# Patient Record
Sex: Male | Born: 1945 | Race: White | Hispanic: No | Marital: Married | Smoking: Former smoker
Health system: Southern US, Community
[De-identification: ages and names within clinical notes are randomized; demographics above are authoritative.]

## PROBLEM LIST (undated history)

## (undated) DIAGNOSIS — IMO0002 Reserved for concepts with insufficient information to code with codable children: Secondary | ICD-10-CM

## (undated) DIAGNOSIS — N400 Enlarged prostate without lower urinary tract symptoms: Secondary | ICD-10-CM

## (undated) DIAGNOSIS — F32A Depression, unspecified: Secondary | ICD-10-CM

## (undated) DIAGNOSIS — M545 Low back pain, unspecified: Secondary | ICD-10-CM

## (undated) DIAGNOSIS — E785 Hyperlipidemia, unspecified: Secondary | ICD-10-CM

## (undated) DIAGNOSIS — F419 Anxiety disorder, unspecified: Secondary | ICD-10-CM

## (undated) DIAGNOSIS — G44009 Cluster headache syndrome, unspecified, not intractable: Secondary | ICD-10-CM

## (undated) DIAGNOSIS — C3490 Malignant neoplasm of unspecified part of unspecified bronchus or lung: Secondary | ICD-10-CM

## (undated) DIAGNOSIS — Z9289 Personal history of other medical treatment: Secondary | ICD-10-CM

## (undated) DIAGNOSIS — J449 Chronic obstructive pulmonary disease, unspecified: Secondary | ICD-10-CM

## (undated) DIAGNOSIS — G709 Myoneural disorder, unspecified: Secondary | ICD-10-CM

## (undated) DIAGNOSIS — M2550 Pain in unspecified joint: Secondary | ICD-10-CM

## (undated) DIAGNOSIS — I2510 Atherosclerotic heart disease of native coronary artery without angina pectoris: Secondary | ICD-10-CM

## (undated) DIAGNOSIS — I10 Essential (primary) hypertension: Secondary | ICD-10-CM

## (undated) DIAGNOSIS — K5900 Constipation, unspecified: Secondary | ICD-10-CM

## (undated) DIAGNOSIS — F329 Major depressive disorder, single episode, unspecified: Secondary | ICD-10-CM

## (undated) DIAGNOSIS — E739 Lactose intolerance, unspecified: Secondary | ICD-10-CM

## (undated) DIAGNOSIS — K219 Gastro-esophageal reflux disease without esophagitis: Secondary | ICD-10-CM

## (undated) DIAGNOSIS — Z7689 Persons encountering health services in other specified circumstances: Secondary | ICD-10-CM

## (undated) DIAGNOSIS — J189 Pneumonia, unspecified organism: Secondary | ICD-10-CM

## (undated) DIAGNOSIS — G4733 Obstructive sleep apnea (adult) (pediatric): Secondary | ICD-10-CM

## (undated) DIAGNOSIS — E119 Type 2 diabetes mellitus without complications: Secondary | ICD-10-CM

## (undated) DIAGNOSIS — E669 Obesity, unspecified: Secondary | ICD-10-CM

## (undated) DIAGNOSIS — N2889 Other specified disorders of kidney and ureter: Secondary | ICD-10-CM

## (undated) DIAGNOSIS — I209 Angina pectoris, unspecified: Secondary | ICD-10-CM

## (undated) DIAGNOSIS — J45909 Unspecified asthma, uncomplicated: Secondary | ICD-10-CM

## (undated) DIAGNOSIS — E559 Vitamin D deficiency, unspecified: Secondary | ICD-10-CM

## (undated) DIAGNOSIS — R011 Cardiac murmur, unspecified: Secondary | ICD-10-CM

## (undated) DIAGNOSIS — C4490 Unspecified malignant neoplasm of skin, unspecified: Secondary | ICD-10-CM

## (undated) DIAGNOSIS — C649 Malignant neoplasm of unspecified kidney, except renal pelvis: Secondary | ICD-10-CM

## (undated) DIAGNOSIS — R079 Chest pain, unspecified: Secondary | ICD-10-CM

## (undated) DIAGNOSIS — K279 Peptic ulcer, site unspecified, unspecified as acute or chronic, without hemorrhage or perforation: Secondary | ICD-10-CM

## (undated) DIAGNOSIS — M1990 Unspecified osteoarthritis, unspecified site: Secondary | ICD-10-CM

## (undated) DIAGNOSIS — H9313 Tinnitus, bilateral: Secondary | ICD-10-CM

## (undated) HISTORY — DX: Benign prostatic hyperplasia without lower urinary tract symptoms: N40.0

## (undated) HISTORY — DX: Pain in unspecified joint: M25.50

## (undated) HISTORY — PX: UPPER GI ENDOSCOPY: SHX6162

## (undated) HISTORY — DX: Peptic ulcer, site unspecified, unspecified as acute or chronic, without hemorrhage or perforation: K27.9

## (undated) HISTORY — DX: Obstructive sleep apnea (adult) (pediatric): G47.33

## (undated) HISTORY — PX: UMBILICAL HERNIA REPAIR: SHX196

## (undated) HISTORY — DX: Constipation, unspecified: K59.00

## (undated) HISTORY — DX: Low back pain, unspecified: M54.50

## (undated) HISTORY — DX: Type 2 diabetes mellitus without complications: E11.9

## (undated) HISTORY — PX: BACK SURGERY: SHX140

## (undated) HISTORY — PX: COLONOSCOPY: SHX174

## (undated) HISTORY — DX: Lactose intolerance, unspecified: E73.9

## (undated) HISTORY — PX: LAPAROSCOPIC CHOLECYSTECTOMY: SUR755

## (undated) HISTORY — PX: NASAL SINUS SURGERY: SHX719

## (undated) HISTORY — DX: Essential (primary) hypertension: I10

## (undated) HISTORY — DX: Gastro-esophageal reflux disease without esophagitis: K21.9

## (undated) HISTORY — DX: Chronic obstructive pulmonary disease, unspecified: J44.9

## (undated) HISTORY — DX: Chest pain, unspecified: R07.9

## (undated) HISTORY — DX: Unspecified osteoarthritis, unspecified site: M19.90

## (undated) HISTORY — DX: Atherosclerotic heart disease of native coronary artery without angina pectoris: I25.10

## (undated) HISTORY — DX: Hyperlipidemia, unspecified: E78.5

## (undated) HISTORY — PX: HIATAL HERNIA REPAIR: SHX195

## (undated) HISTORY — PX: CERVICAL SPINE SURGERY: SHX589

## (undated) HISTORY — PX: OTHER SURGICAL HISTORY: SHX169

## (undated) HISTORY — DX: Low back pain: M54.5

## (undated) HISTORY — PX: ESOPHAGEAL MANOMETRY: SHX1526

## (undated) HISTORY — DX: Obesity, unspecified: E66.9

## (undated) HISTORY — PX: CARDIAC CATHETERIZATION: SHX172

## (undated) HISTORY — DX: Reserved for concepts with insufficient information to code with codable children: IMO0002

## (undated) HISTORY — DX: Cluster headache syndrome, unspecified, not intractable: G44.009

---

## 1998-01-19 ENCOUNTER — Ambulatory Visit (HOSPITAL_COMMUNITY): Admission: RE | Admit: 1998-01-19 | Discharge: 1998-01-19 | Payer: Self-pay | Admitting: Cardiology

## 1998-05-15 ENCOUNTER — Encounter: Admission: RE | Admit: 1998-05-15 | Discharge: 1998-06-03 | Payer: Self-pay | Admitting: Anesthesiology

## 1998-09-16 ENCOUNTER — Encounter: Admission: RE | Admit: 1998-09-16 | Discharge: 1998-12-10 | Payer: Self-pay | Admitting: Anesthesiology

## 1999-08-24 ENCOUNTER — Inpatient Hospital Stay (HOSPITAL_COMMUNITY): Admission: EM | Admit: 1999-08-24 | Discharge: 1999-08-27 | Payer: Self-pay | Admitting: Emergency Medicine

## 1999-08-26 ENCOUNTER — Encounter: Payer: Self-pay | Admitting: Gastroenterology

## 1999-11-08 ENCOUNTER — Inpatient Hospital Stay (HOSPITAL_COMMUNITY): Admission: EM | Admit: 1999-11-08 | Discharge: 1999-11-10 | Payer: Self-pay | Admitting: Emergency Medicine

## 2000-02-25 ENCOUNTER — Encounter: Payer: Self-pay | Admitting: Anesthesiology

## 2000-02-25 ENCOUNTER — Ambulatory Visit (HOSPITAL_COMMUNITY): Admission: RE | Admit: 2000-02-25 | Discharge: 2000-02-25 | Payer: Self-pay | Admitting: Anesthesiology

## 2000-02-29 ENCOUNTER — Encounter: Admission: RE | Admit: 2000-02-29 | Discharge: 2000-05-29 | Payer: Self-pay | Admitting: Anesthesiology

## 2000-03-13 ENCOUNTER — Encounter: Payer: Self-pay | Admitting: Emergency Medicine

## 2000-03-13 ENCOUNTER — Inpatient Hospital Stay (HOSPITAL_COMMUNITY): Admission: EM | Admit: 2000-03-13 | Discharge: 2000-03-16 | Payer: Self-pay | Admitting: Emergency Medicine

## 2000-03-14 ENCOUNTER — Encounter: Payer: Self-pay | Admitting: Surgery

## 2000-05-16 ENCOUNTER — Ambulatory Visit (HOSPITAL_COMMUNITY): Admission: RE | Admit: 2000-05-16 | Discharge: 2000-05-16 | Payer: Self-pay | Admitting: Neurosurgery

## 2000-05-16 ENCOUNTER — Encounter: Payer: Self-pay | Admitting: Neurosurgery

## 2000-05-31 ENCOUNTER — Encounter: Payer: Self-pay | Admitting: Neurosurgery

## 2000-05-31 ENCOUNTER — Ambulatory Visit (HOSPITAL_COMMUNITY): Admission: RE | Admit: 2000-05-31 | Discharge: 2000-06-01 | Payer: Self-pay | Admitting: Neurosurgery

## 2000-06-27 ENCOUNTER — Encounter: Payer: Self-pay | Admitting: Neurosurgery

## 2000-06-27 ENCOUNTER — Ambulatory Visit (HOSPITAL_COMMUNITY): Admission: RE | Admit: 2000-06-27 | Discharge: 2000-06-27 | Payer: Self-pay | Admitting: Neurosurgery

## 2000-09-04 ENCOUNTER — Encounter: Payer: Self-pay | Admitting: Neurosurgery

## 2000-09-04 ENCOUNTER — Ambulatory Visit (HOSPITAL_COMMUNITY): Admission: RE | Admit: 2000-09-04 | Discharge: 2000-09-04 | Payer: Self-pay | Admitting: Neurosurgery

## 2000-09-08 ENCOUNTER — Inpatient Hospital Stay (HOSPITAL_COMMUNITY): Admission: EM | Admit: 2000-09-08 | Discharge: 2000-09-12 | Payer: Self-pay | Admitting: Emergency Medicine

## 2000-09-11 ENCOUNTER — Encounter: Payer: Self-pay | Admitting: *Deleted

## 2001-01-09 ENCOUNTER — Encounter: Payer: Self-pay | Admitting: Neurosurgery

## 2001-01-09 ENCOUNTER — Ambulatory Visit (HOSPITAL_COMMUNITY): Admission: RE | Admit: 2001-01-09 | Discharge: 2001-01-09 | Payer: Self-pay | Admitting: Neurosurgery

## 2002-10-24 ENCOUNTER — Ambulatory Visit (HOSPITAL_COMMUNITY): Admission: RE | Admit: 2002-10-24 | Discharge: 2002-10-24 | Payer: Self-pay | Admitting: Ophthalmology

## 2004-03-22 ENCOUNTER — Ambulatory Visit (HOSPITAL_COMMUNITY): Admission: RE | Admit: 2004-03-22 | Discharge: 2004-03-22 | Payer: Self-pay | Admitting: General Surgery

## 2004-03-22 ENCOUNTER — Encounter (INDEPENDENT_AMBULATORY_CARE_PROVIDER_SITE_OTHER): Payer: Self-pay | Admitting: Specialist

## 2004-03-22 ENCOUNTER — Ambulatory Visit (HOSPITAL_BASED_OUTPATIENT_CLINIC_OR_DEPARTMENT_OTHER): Admission: RE | Admit: 2004-03-22 | Discharge: 2004-03-22 | Payer: Self-pay | Admitting: General Surgery

## 2006-06-26 ENCOUNTER — Ambulatory Visit (HOSPITAL_COMMUNITY): Admission: RE | Admit: 2006-06-26 | Discharge: 2006-06-27 | Payer: Self-pay | Admitting: Cardiology

## 2007-04-26 ENCOUNTER — Ambulatory Visit (HOSPITAL_COMMUNITY): Admission: RE | Admit: 2007-04-26 | Discharge: 2007-04-26 | Payer: Self-pay | Admitting: Cardiology

## 2007-05-24 ENCOUNTER — Encounter: Admission: RE | Admit: 2007-05-24 | Discharge: 2007-05-24 | Payer: Self-pay | Admitting: Gastroenterology

## 2007-05-30 ENCOUNTER — Encounter: Admission: RE | Admit: 2007-05-30 | Discharge: 2007-05-30 | Payer: Self-pay | Admitting: *Deleted

## 2007-06-03 ENCOUNTER — Inpatient Hospital Stay (HOSPITAL_COMMUNITY): Admission: EM | Admit: 2007-06-03 | Discharge: 2007-06-13 | Payer: Self-pay | Admitting: Emergency Medicine

## 2007-06-13 ENCOUNTER — Ambulatory Visit: Payer: Self-pay | Admitting: Physical Medicine & Rehabilitation

## 2007-06-13 ENCOUNTER — Inpatient Hospital Stay (HOSPITAL_COMMUNITY)
Admission: RE | Admit: 2007-06-13 | Discharge: 2007-06-22 | Payer: Self-pay | Admitting: Physical Medicine & Rehabilitation

## 2007-06-14 HISTORY — PX: OTHER SURGICAL HISTORY: SHX169

## 2007-07-12 ENCOUNTER — Ambulatory Visit (HOSPITAL_COMMUNITY): Admission: RE | Admit: 2007-07-12 | Discharge: 2007-07-12 | Payer: Self-pay | Admitting: Surgery

## 2007-07-16 ENCOUNTER — Ambulatory Visit (HOSPITAL_COMMUNITY): Admission: RE | Admit: 2007-07-16 | Discharge: 2007-07-16 | Payer: Self-pay | Admitting: Surgery

## 2007-07-19 ENCOUNTER — Ambulatory Visit (HOSPITAL_COMMUNITY): Admission: RE | Admit: 2007-07-19 | Discharge: 2007-07-19 | Payer: Self-pay | Admitting: Gastroenterology

## 2007-09-03 ENCOUNTER — Encounter (INDEPENDENT_AMBULATORY_CARE_PROVIDER_SITE_OTHER): Payer: Self-pay | Admitting: Surgery

## 2007-09-03 ENCOUNTER — Inpatient Hospital Stay (HOSPITAL_COMMUNITY): Admission: RE | Admit: 2007-09-03 | Discharge: 2007-09-05 | Payer: Self-pay | Admitting: Surgery

## 2007-10-10 ENCOUNTER — Ambulatory Visit (HOSPITAL_COMMUNITY): Admission: RE | Admit: 2007-10-10 | Discharge: 2007-10-10 | Payer: Self-pay | Admitting: Internal Medicine

## 2008-07-22 ENCOUNTER — Ambulatory Visit: Payer: Self-pay | Admitting: Cardiology

## 2008-12-25 ENCOUNTER — Encounter: Payer: Self-pay | Admitting: Cardiology

## 2008-12-26 ENCOUNTER — Encounter: Payer: Self-pay | Admitting: Cardiology

## 2009-01-13 DIAGNOSIS — K219 Gastro-esophageal reflux disease without esophagitis: Secondary | ICD-10-CM

## 2009-01-13 DIAGNOSIS — M545 Low back pain, unspecified: Secondary | ICD-10-CM | POA: Insufficient documentation

## 2009-01-13 DIAGNOSIS — N320 Bladder-neck obstruction: Secondary | ICD-10-CM

## 2009-01-13 DIAGNOSIS — I10 Essential (primary) hypertension: Secondary | ICD-10-CM | POA: Insufficient documentation

## 2009-01-13 DIAGNOSIS — E785 Hyperlipidemia, unspecified: Secondary | ICD-10-CM

## 2009-01-13 DIAGNOSIS — K279 Peptic ulcer, site unspecified, unspecified as acute or chronic, without hemorrhage or perforation: Secondary | ICD-10-CM

## 2009-01-13 DIAGNOSIS — J449 Chronic obstructive pulmonary disease, unspecified: Secondary | ICD-10-CM

## 2009-01-13 DIAGNOSIS — G44009 Cluster headache syndrome, unspecified, not intractable: Secondary | ICD-10-CM

## 2009-01-13 DIAGNOSIS — I2510 Atherosclerotic heart disease of native coronary artery without angina pectoris: Secondary | ICD-10-CM

## 2009-01-21 ENCOUNTER — Ambulatory Visit: Payer: Self-pay | Admitting: Cardiology

## 2009-06-18 ENCOUNTER — Encounter (INDEPENDENT_AMBULATORY_CARE_PROVIDER_SITE_OTHER): Payer: Self-pay | Admitting: *Deleted

## 2009-08-03 ENCOUNTER — Encounter: Admission: RE | Admit: 2009-08-03 | Discharge: 2009-08-03 | Payer: Self-pay | Admitting: Otolaryngology

## 2009-12-29 ENCOUNTER — Encounter: Payer: Self-pay | Admitting: Cardiology

## 2010-07-15 NOTE — Letter (Signed)
Summary: Appointment - Reminder 2  [employer], Main Office  [address]   [place], [place] [address]   Phone: [phone]  Fax: [phone]     June 18, 2009 MRN: [id number]   [facility] [address] [place], [place]  [address]   Dear [name],  Our records indicate that it is time to schedule a follow-up appointment with Dr. Shirlee Latch. It is very important that we reach you to schedule this appointment. We look forward to participating in your health care needs. Please contact us at the number listed above at your earliest convenience to schedule your appointment.  If you are unable to make an appointment at this time, give [name] a call so we can update our records.  Sincerely,    Migdalia Dk [facility] Scheduling Team

## 2010-09-22 ENCOUNTER — Other Ambulatory Visit (INDEPENDENT_AMBULATORY_CARE_PROVIDER_SITE_OTHER): Payer: Self-pay | Admitting: Otolaryngology

## 2010-09-22 DIAGNOSIS — J320 Chronic maxillary sinusitis: Secondary | ICD-10-CM

## 2010-09-27 ENCOUNTER — Ambulatory Visit
Admission: RE | Admit: 2010-09-27 | Discharge: 2010-09-27 | Disposition: A | Discharge status: Home / Self Care | Payer: Medicare Other | Source: Ambulatory Visit | Attending: Otolaryngology | Admitting: Otolaryngology

## 2010-09-27 DIAGNOSIS — J320 Chronic maxillary sinusitis: Secondary | ICD-10-CM

## 2010-10-26 NOTE — Discharge Summary (Signed)
[name], [name] NO.:  [id number]   MEDICAL RECORD NO.:  [id number]          PATIENT TYPE:  INP   LOCATION:  5022                         FACILITY:  [facility]   PHYSICIAN:  Gabrielle Dare. Janee Morn, M.D.DATE OF BIRTH:  04/20/1946   DATE OF ADMISSION:  06/03/2007  DATE OF DISCHARGE:  06/13/2007                               DISCHARGE SUMMARY   The patient discharged to rehab.   ADMITTING TRAUMA SURGEON:  Adolph Pollack, M.D.   CONSULTANTS:  Payton Doughty, M.D., neurosurgery, Newman Pies, M.D., ENT, and  [name], M.D., hand surgery.   DISCHARGE DIAGNOSES:  1. Fall from a roof.  2. Basilar skull fracture/concussion.  3. Thoracic one through thoracic four spine fractures, unstable.  4. Right intra-articular distal radius fracture.  5. Cervical five-six spinous process fractures.  6. Bilateral first rib fractures.  7. Bilateral nasal bone fractures.  8. Bilateral maxillary sinus fracture.  9. History of coronary artery disease.  10.Dyslipidemia.  11.Diabetes mellitus.  12.Hypertension.   PROCEDURE:  A C5 through T6 posterior fusion per Dr. Channing Mutters on June 05, 2007, and open reduction internal fixation, right intra-articular distal  radius fracture and right carpal tunnel release on June 08, 2007,  Dr. Mina Marble.   HISTORY OF ADMISSION:  [name] is a 65 year old male with multiple  premorbid medical problems who fell off a roof.  He apparently called  for help utilizing a cell phone.  There was suspected loss of  consciousness.  He presented complaining of upper back pain.  His  initial blood pressure was 136/57, pulse was 81.  Oxygen was 97% on  oxygen by face mask.   Workup at this time, including a plain chest x-ray, was negative.  Plain  pelvic films were negative.  Right wrist film showed a distal intra-  articular comminuted radial fracture.  Head CT scan showed no acute  intracranial bleeding, but there was evidence for basilar skull  fracture.  C-spine CT scan showed a T1 spinous process fracture and C5  and C6 spinous process fractures.  Chest CT scan showed T2, T3, and T4  fractures with some retropulsion about T3 and associated hematoma.  Abdominal and pelvic CT scan showed hiatal hernia, but there was no  evidence for acute solid organ injury or free fluid or other fractures  in the L-spine or in the pelvis.   The patient was admitted to the ICU.  He was seen in consultation per  Dr. Channing Mutters, and it was felt that his spine fractures were unstable and  ultimately would required fixation.  The patient was seen by cardiology,  and it was recommended that he continue aspirin for prophylaxis but that  his Plavix could be discontinued.  He subsequently was able to undergo  posterior fusion from C5-T6 per Dr. Channing Mutters on June 05, 2007.  He  remained intubated initially postop but was able to be extubated the  following day, on June 06, 2007.  He did have some difficulty with  mobilization following this but ultimately was mobilized with therapies,  albeit with some continued deficits.  Rehabilitation assessed the  patient and felt that he would benefit from a comprehensive inpatient  rehabilitation stay, and the patient was transferred to rehab on  June 13, 2007, for this purpose.   Right wrist fracture.  The patient did have a comminuted intra-articular  right distal radius fracture.  He underwent delayed ORIF once he was  stabilized sufficiently per Dr. Mina Marble on June 08, 2007.  He had  no further difficulties following this.   Multiple facial fractures.  The patient was seen in consultation per Dr.  Suszanne Conners for his fractures.  They were minimally displaced, and it was felt  that no surgical intervention would be required.   Again, the patient was making gradual gains and it was felt he should be  transferred to rehab, and he was accepted to rehab for continued  mobilization on June 13, 2007.       Shawn Rayburn, P.A.      Gabrielle Dare Janee Morn, M.D.  Electronically Signed    SR/MEDQ  D:  06/29/2007  T:  06/29/2007  Job:  [id number]

## 2010-10-26 NOTE — Op Note (Signed)
[name], ENGEN NO.:  [id number]   MEDICAL RECORD NO.:  [id number]          PATIENT TYPE:  INP   LOCATION:  3104                         FACILITY:  [facility]   PHYSICIAN:  Payton Doughty, M.D.      DATE OF BIRTH:  1946/03/17   DATE OF PROCEDURE:  06/05/2007  DATE OF DISCHARGE:                               OPERATIVE REPORT   PREOPERATIVE DIAGNOSIS:  T1 laminar fracture, T2, T3, T4 vertebral body  fractures.   POSTOPERATIVE DIAGNOSIS:  T1 laminar fracture, T2, T3, T4 vertebral body  fractures.   OPERATIVE PROCEDURE:  C5-T6 posterior fusion with Oasis and radius  screws.   SURGEON:  Payton Doughty, M.D.   ANESTHESIA:  General endotracheal.  Prep __________ alcohol wipe.   COMPLICATIONS:  None.   NURSE ASSISTANT:  [place].   DOCTOR ASSISTANT:  Phoebe Perch.   A 65 year old gentleman fell off a roof the day before yesterday and has  incurred compression fractures of T2, T3, and T4 as well as a laminar  fracture of T1.   The patient was taken to operating room, __________  intubated, placed  prone on the operating table on the Stryker bed.  All pressure points  were padded.  Following shave, prep, and drape in the usual sterile  fashion, the skin was infiltrated with 1% lidocaine with 1:100,000  epinephrine.  The skin was incised from the top of C5 to the bottom of  T6.  There was a lot of soft tissue injury which bled in an oozing  fashion throughout most of the case.  The lamina of C5, C6, C7 were  identified easily.  The lamina of T1 had been fractured off.  However,  from the films it was noted that the transverse processes and the  vertebral body were intact.  These were dissected free.  The lamina of  T2 was fractured.  The transverse process of T2 were intact, however,  there was a vertebral body fracture.  The transverse process of T3 on  the right was fractured.  The transverse process of T4 on the right was  also fractured, and on the left was intact.   T5 and T6 were intact.  Lateral mass screws were placed in C5, C6, and C7.  Pedicle screws were  placed in T1 and T2 on the left.  T1, T2, and T3 on the right.  Those  were Oasis screws.  Radius screws which were 4.75 x 40 mm were placed  and T6 and T5 on the right and T6, T5, and T4 on the left.  This left a  gap at T3 on the left and at T4 on the right. The radius rod was fitted  to the radius screws and the Oasis rod was fitted to the Oasis screws.  The rods were then contoured at their terminal end to fit in the  connecting device and allow coupling of the radius to the Oasis rods.  This allowed good realignment of the spine.  All locking caps and screws  were tightened.  The set screws in the connecting device was tightened.  The  wound was irrigated.  Hemostasis assured.  The transverse processes  and lamina were decorticated with a high speed drill and packed with BMP  on the extender  matrix Actifuse.  Final x-ray showed good placement of all hardware.  Successive layers of 0 Vicryl, 2-0 Vicryl, and 3-0 nylon were used to  close.  Betadine and Telfa dressings applied.  The patient's splint was  placed back on his right arm and he is being allowed to awaken from  anesthesia at this time.           ______________________________  Payton Doughty, M.D.     MWR/MEDQ  D:  06/05/2007  T:  06/06/2007  Job:  [id number]

## 2010-10-26 NOTE — Consult Note (Signed)
NAMEEZEKIAL, ARNS NO.:  [id number]   MEDICAL RECORD NO.:  [id number]          PATIENT TYPE:  IPS   LOCATION:  4011                         FACILITY:  [facility]   PHYSICIAN:  Fayrene Fearing L. Malon Kindle., M.D.DATE OF BIRTH:  [birth year]   DATE OF CONSULTATION:  06/19/2007  DATE OF DISCHARGE:                                 CONSULTATION   REASON FOR CONSULTATION:  Dyspepsia.   HISTORY:  A 65 year old gentleman who has had a long history of  esophageal reflux.  I have been seeing him for a number of years, and he  had a Nissen fundoplication done approximately 30 years ago due to  severe reflux.  This was done as an open procedure.  He has had  continuing symptoms since that of reflux and vague epigastric pain.  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  Initially this has been  somewhat asymptomatic, but it has become progressively more symptomatic.  He has actually come into the hospital with chest pain and was evaluated  for possible coronary disease.  It was felt this was noncardiac pain and  he was sent back over.  We just re-endoscoped him within the last month,  and it was felt compared to previous endoscopies that his paraesophageal  hernia was increasing in size.  I made the decision to send him to Dr.  Karie Soda for evaluation, with a probable laparoscopic revision of  his Nissen.  It appeared that his Nissen fundoplication was coming down,  and we therefore felt this should be reevaluated.   Notably, he has been on number of PPIs in the past without problems.  Both Prilosec, Nexium Protonix, etc.  He has taking all these on a  p.r.n. basis.   He was admitted to the hospital on June 03, 2007 after falling off a  roof.  He was blowing leaves and slipped off.  He has had extensive  hospitalization on the trauma service and now is on the rehabilitation  service.  His injuries  have included fractures of his skull and the  basal skull fracture; sinus and nasal bones, a T1-T4 burst fractures,  and fractures of the spinous processes of C5 and C6.   DICTATION CUT OFF HERE           ______________________________  Llana Aliment. Malon Kindle., M.D.     Waldron Session  D:  06/19/2007  T:  06/19/2007  Job:  [id number]   cc:   Lucky Cowboy, M.D.  Ardeth Sportsman, MD

## 2010-10-26 NOTE — Consult Note (Signed)
NAMEELIU, BATCH NO.:  [id number]   MEDICAL RECORD NO.:  [id number]          PATIENT TYPE:  IPS   LOCATION:  4011                         FACILITY:  [facility]   PHYSICIAN:  Fayrene Fearing L. Malon Kindle., M.D.DATE OF BIRTH:  1945-11-24   DATE OF CONSULTATION:  07/05/2007  DATE OF DISCHARGE:  06/22/2007                                 CONSULTATION   ADDENDUM:  Please see previously dictated partial consult.  The hospital  had a power outage in the middle of the dictation and a good portion of  it was cut off, and I am picking up where it appeared to be cut off.   The patient had dyspepsia and epigastric discomfort following his  admission here for a fall from the roof.  He had not been started back  on Protonix.  Initially he was, but apparently when he got to the rehab  unit, this got stopped for unclear reasons.  He was changed to Carafate.  He noted he had tolerated proton pump inhibitors in the past with very  little problem.   CURRENT MEDICATIONS:  Please see hospital chart.   PAST MEDICAL HISTORY:  Reviewed and no significant changes.   PHYSICAL EXAMINATION:  VITAL SIGNS:  Unremarkable.  GENERAL:  The patient sitting in a wheelchair with a neck brace on.  HEART/LUNGS:  Normal.  ABDOMEN:  Soft and nontender.   ASSESSMENT:  Dyspepsia almost certainly due to the fact that he is not  on proton pump inhibitors, he has chronically required these.  He is due  at some point in time to have his paraesophageal hiatal hernia repaired  by Dr. Michaell Cowing.  This had been present for several years.  There has been  no enlarging and it is getting to the point now he is beginning to  have difficulty.   PLAN:  1. We will go ahead and start him back on his proton pump inhibitor      b.i.d. for 3 days and then daily.  2. Suggested that he see Dr. Michaell Cowing back in the office in the next 2-3      months after he has gotten over his fall to see about moving ahead      with repair of his  paraesophageal hiatal hernia.           ______________________________  Llana Aliment. Malon Kindle., M.D.    Waldron Session  D:  07/05/2007  T:  07/06/2007  Job:  [id number]   cc:   Ellwood Dense, M.D.  Lucky Cowboy, M.D.  Ardeth Sportsman, MD  Llana Aliment Malon Kindle., M.D.

## 2010-10-26 NOTE — Discharge Summary (Signed)
[name], [name] NO.:  [id number]   MEDICAL RECORD NO.:  [id number]          PATIENT TYPE:  IPS   LOCATION:  4011                         FACILITY:  [facility]   PHYSICIAN:  [name], M.D.   DATE OF BIRTH:  [birth year]   DATE OF ADMISSION:  06/13/2007  DATE OF DISCHARGE:  06/22/2007                               DISCHARGE SUMMARY   REVIEW OF SYSTEMS:   DISCHARGE DIAGNOSES:  1. Multiple trauma after fall, June 03, 2007.  2. Basilar skull fracture, as well as facial fractures.  3. Right distal radius fracture with open external fixation, as well      as right carpal tunnel release, June 08, 2007.  4. Cervical C5-6 and thoracic T1 spinous process fracture.  5. Thoracic T3-4 burst fracture.  6. Status post C5-T6 posterior fusion.  7. Dysphagia.  8. Diabetes mellitus.  9. Coronary artery disease with percutaneous transluminal coronary      angioplasty.  10.Upper gastrointestinal bleed.   This is a 65 year old male admitted December 21, after a fall off a  roof, sustained right distal radius fractures.  Cervical C5-6 spinous  process fracture, thoracic T1 spinous process fracture with burst  fracture of thoracic T3 and T4 with mild retropulsion at T3, as well as  findings of basilar skull fracture.  Cardiac clearance for history of  coronary artery disease and underwent cervical C5-T6 segmental fusion  December 23 per [name], M.D. and open reduction internal external  fixation, distal radius on right per [name], M.D., as  carpal tunnel release on December 26.  Facial fractures with  conservative care, as well as conservative care of basilar skull  fracture.  Fitted with CTLSO brace when out of bed, as well as [place]-J  collar when sitting.  Non-weightbearing right upper extremity with  platform walker.  Hospital course with  TNA for nutrition.  His diet was  slowly advanced.  Foley catheter tube removed December 30.  Placed on  Avelox December 29 for empiric coverage secondary to some chest rhonchi.  Follow-up cranial CT scan December 27, for basilar skull fracture  unchanged from prior scans.   PAST MEDICAL HISTORY:  1. See discharge diagnoses.  2. Remote smoker.  3. No alcohol.   ALLERGIES:  PENICILLIN, SULFA AND CODEINE.   SOCIAL HISTORY:  Married, semi-retired.  He works in [employer], [employer], wife  works day shifts, one level home, three steps to entry.  No other local  family.   MEDICATIONS PRIOR TO ADMISSION:  Aspirin, Plavix, metoprolol,  nitroglycerin as needed, Welchol.   REHABILITATION HOSPITAL COURSE:  The patient was admitted to inpatient  rehab services with therapies initiated on a 3-hour daily basis  consisting of physical therapy, occupational therapy, speech therapy and  rehabilitation nursing.  The following issues were addressed during the  patient's rehabilitation stay.  Pertaining to [name]'s multiple  trauma, basilar skull fracture, conservative care provided, right distal  radius fracture, non-weightbearing right upper extremity after open  external fixation followed by Darrell Pais. Mina Mccullough, M.D. of orthopedic  services.  He had been fitted with a resting splint  to be on at all  times except when bathing.  He continued with his CTLSO brace for  cervical fractures and had undergone cervical C5-6, thoracic T6  posterior fusion.  He was able to wear a [place]-J collar when sitting.  His diet was steadily advanced which he tolerated well.  Blood sugars  controlled now on diet.  His Lantus insulin had been discontinued.  He  completed a course of Avelox for suspect pneumonia.  His lungs were  clear to auscultation.  Oxygen saturation was greater than 90% on room  air.  Functional mobility.  Currently, the patient was minimal assist  150 feet, close supervision for stairs, supervision self-feeding,  minimal assist grooming, bathing, upper body dressing, toilet transfers,  moderate assist  lower body dressing.  Overall strength endurance had  greatly improved.  He was encouraged of overall progress and ultimate  Mccullough was discharged to home.   Latest labs showed hemoglobin 10.5, hematocrit 31.  Sodium 130,  potassium 3.8, BUN 10, creatinine 0.8.   DISCHARGE MEDICATIONS:  Time of dictation included:  1. Lopressor 75 mg daily.  2. Advair 100/50 Diskus 1 puff daily.  3. Aspirin 81 mg daily.  4. Carafate 1 gram a.c. and h.s.  5. Duragesic patch 50 mcg change every 72 hours.  He is provided with      five patches.  6. Protonix 40 mg daily.  7. Plavix 75 mg daily.  8. Nitroglycerin as needed.  9. Vicodin 5/325 one or two tablets four times daily as needed,      dispense 60 tablets.  10.He was on subcutaneous Lovenox throughout his hospital course for      deep vein thrombosis prophylaxis.  This was discontinued at the      time of discharge.   Diet was diabetic diet.   He was advised to follow up with [name], M.D., [phone],  neurosurgery, Darrell Pais. Mina Mccullough, M.D., orthopedic hand surgeon and  remain follow up with his primary MD,  Dr. Marvel Mccullough.      [name], P.A.    ______________________________  [name], M.D.    DA/MEDQ  D:  06/21/2007  T:  06/21/2007  Job:  [id number]   cc:   [name], M.D.  [name], M.D.  [name], M.D.  [name], Dr.

## 2010-10-26 NOTE — Consult Note (Signed)
NAMEAPOLO, CUTSHAW NO.:  [id number]   MEDICAL RECORD NO.:  [id number]          PATIENT TYPE:  INP   LOCATION:  3310                         FACILITY:  [facility]   PHYSICIAN:  Artist Pais. Weingold, M.D.DATE OF BIRTH:  1946/05/10   DATE OF CONSULTATION:  06/03/2007  DATE OF DISCHARGE:                                 CONSULTATION   REASON FOR CONSULTATION:  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  He is  61.   PAST MEDICAL HISTORY:  Significant for having stent placed x3, history  of an upper GI bleed.   CURRENT MEDICATIONS:  He is currently on Plavix.  He has had other  medications including medications for hypercholesterolemia and  hypertension.   PHYSICAL EXAMINATION:  GENERAL:  In the emergency department reveals  well-developed, well-nourished male alert and oriented x3.  MUSCULOSKELETAL:  He is in a cervical spine brace with regards to his  right upper extremity.  He is in a well-padded volar splint.  He  complains of no numbness and tingling.  His digits appeared to be  minimally swollen.  By history this is a closed injury.  X-rays show  displaced intra-articular fracture of distal radius with 20 degrees of  dorsal angulation and a possible nondisplaced 2 mm displaced triquetral  fracture.   [name] is a 65 year old male status post fall off a roof with  multiple injuries including a spinal injury, facial fractures, displaced  intra-articular fracture, nondominant right distal radius.  I have  discussed with both the neurosurgeons and the trauma service his  fracture is stable in his splint. When he  is stabilized and/or will be  taken to the operating room for his other procedures, I will proceed  with fixation of distal radius at that point in time.  He will be  admitted to the trauma service, and I will follow [name]  with them.      Artist Pais Mina Marble, M.D.  Electronically Signed     MAW/MEDQ  D:  06/03/2007  T:  06/04/2007  Job:  [id number]   cc:   Adolph Pollack, M.D.

## 2010-10-26 NOTE — Consult Note (Signed)
[name], [name] NO.:  [id number]   MEDICAL RECORD NO.:  [id number]          PATIENT TYPE:  INP   LOCATION:  3310                         FACILITY:  [facility]   PHYSICIAN:  Payton Doughty, M.D.      DATE OF BIRTH:  1945-11-17   DATE OF CONSULTATION:  06/03/2007  DATE OF DISCHARGE:                                 CONSULTATION   This is a 65 year old right-handed white gentleman who fell off of a  roof onto his face.  He has a right wrist fracture, clavicle fracture,  nose fracture, laminar fractures of C5 and C6 which are really spinous  process fractures, spinous process fracture of T1, burst fractures of  T2, T3, and T4 with mild retropulsion at T3.  He has been neurologically  intact ever since the accident.   PAST MEDICAL HISTORY:  Remarkable for coronary artery disease.  He has  four stents in.  He is on Plavix, Toprol, and Crestor.  He also has a  history of hiatal hernia.   PHYSICAL EXAMINATION:  HEENT:  His nose fracture is evident.  He is  tender between his scapula.  GENERAL: He is awake, alert and oriented x3.  His pupils equal, round,  and reactive to light.  Extraocular movements intact.  Facial movement  and sensation are intact.  Tongue protrudes in midline.  Shoulder shrug  is normal.  He describes no swallowing difficulty.  NEUROLOGY:  Motor examination is 5/5 in the upper and lower extremities.  Sensation is intact.  Reflexes could not be tested on the right side  because of his fractured arm.  Left side is 1 at the knees, toes  downgoing bilaterally.   CT is as discussed above.   IMPRESSION:  1. Cervical spine fracture are non destabilizing.  2. Thoracic spine fractures are unstable and require fusion.  He needs      to be fused from C5 to T6 using lateral masses and pedicles.  We      will plan on doing this on Tuesday and discussed this with the      patient and his wife.           ______________________________  Payton Doughty,  M.D.     MWR/MEDQ  D:  06/03/2007  T:  06/04/2007  Job:  [id number]

## 2010-10-26 NOTE — Op Note (Signed)
[name], [name] NO.:  [id number]   MEDICAL RECORD NO.:  [id number]          PATIENT TYPE:  INP   LOCATION:  3301                         FACILITY:  [facility]   PHYSICIAN:  Artist Pais. Weingold, M.D.DATE OF BIRTH:  01/24/1946   DATE OF PROCEDURE:  06/08/2007  DATE OF DISCHARGE:                               OPERATIVE REPORT   PREOPERATIVE DIAGNOSES:  Displaced intra-articular fracture right distal  radius.   POSTOPERATIVE DIAGNOSES:  Displaced intra-articular fracture right  distal radius.   PROCEDURE:  Open reduction internal fixation above using DVR plate and  screws and right carpal tunnel release through a separate incision.   SURGEON:  [name], M.D.   ASSISTANT:  None.   ANESTHESIA:  General.   TOURNIQUET TIME:  46 minutes.   No complications, no drains.   DESCRIPTION OF PROCEDURE:  The patient was taken to the operating suite  and after the induction of adequate general anesthesia, the right  extremity was prepped and draped in the usual sterile fashion.  An  Esmarch was used to exsanguinate the limb, tourniquet was then inflated  to 250 mmHg. At this point in time, an incision was made over the  palpable border of the flexor carpi radialis tendon, skin was incised 6  cm. The sheath overlying the FCR was incised, this was retracted to the  midline.  The radial artery lateral side, this interval was developed  down the level of the pronator quadratus.  The pronator quadratus was  subperiosteally stripped off the distal radius exposing the fracture  site. The fracture site was debrided of clot.  Reduction was performed.  Intraoperative fluoroscopy revealed adequate reduction. A  DVR plate was  then passed to the lower aspect of the distal radius through the slotted  hole on the plate. Intraoperative fluoroscopy then revealed adequate  placement of the hardware and good reduction.  The remaining cortical  screws and smooth pegs were  placed sequentially. Intraoperative  fluoroscopy then revealed near anatomic reduction in both the AP lateral  and oblique view. The wound was thoroughly irrigated, it was loosely  closed in layers of #0 Vicryl per the pronator quadratus and 4-0  subcuticular Rapide suture on the skin. A second incision was made in  the palmar aspect of the right hand in line with the long finger  metacarpal starting at Kaplan's cardinal line.  The skin was incised,  palmar fascia was identified and split, the distal edge of the  transverse carpal ligament was identified and split with a 15 blade.  The median nerve was identified and protected with a Therapist, nutritional. The  median aspect of the transverse carpal was then divided under direct  vision using a curved blunt  scissor. There was a significant of blood in the canal, this was  irrigated out. This wound was also then closed with a 3-0 Rapide  subcuticular stitch.  Steri-Strips, 4x4s, fluffs and a volar splint was  applied.  The patient tolerated the procedures well and went to recovery  in a stable fashion.      Artist Pais Mina Marble, M.D.  Electronically  Signed     MAW/MEDQ  D:  06/08/2007  T:  06/08/2007  Job:  [id number]

## 2010-10-26 NOTE — Cardiovascular Report (Signed)
[name], [name] NO.:  [id number]   MEDICAL RECORD NO.:  [id number]          PATIENT TYPE:  OIB   LOCATION:  2899                         FACILITY:  [facility]   PHYSICIAN:  Georga Hacking, M.D.DATE OF BIRTH:  1946/04/16   DATE OF PROCEDURE:  DATE OF DISCHARGE:                            CARDIAC CATHETERIZATION   INDICATIONS:  Patient with known previous stent placement and history of  coronary artery disease, who has had worsening chest discomfort.  He  presented with worsening angina.   PROCEDURE:  Left heart catheterization with coronary angiograms and left  ventriculogram.   COMMENTS ABOUT PROCEDURE:  The procedure was done on a same-day basis  with possible percutaneous intervention.  The right femoral artery was  entered using a single anterior needle-wall stick and angiograms were  made using 6-French catheters.  Following the procedure, a femoral  angiogram was made, but the stick was felt to be too close to high  bifurcation to do percutaneous closure.   HEMODYNAMIC DATA:  Aorta post contrast 124/72.  LV post contrast 124/13  to 18.   ANGIOGRAPHIC DATA:  Left ventriculogram:  Performed in the 30 degree RAO  projection, the aortic valve is normal, mitral valve is normal and left  ventricular appears normal in size.  Estimated ejection fraction was  65%.  Coronary arteries arise and distribute normally.  Calcification is  notable in the left coronary system.  Left main coronary artery is  normal.  Left anterior descending has a widely patent stent site in the  proximal LAD.  The LAD terminates on the distal anterior wall.  A  diagonal branch arise and has scattered irregularity.  Circumflex  coronary artery is occluded after the first marginal branch and fills by  well-developed system collaterals in the right coronary artery.  When  the circumflex is viewed coming from the right coronary artery, there  appears to be a stenosis following a small  marginal branch that could be  a potential site of ischemia.  The right coronary artery is calcified to  a mild degree, there is moderate irregularity proximally, the mid vessel  stent is widely patent and the stent in the posterior descending artery  is widely patent.  There is good collateral filling of the circumflex  seen from the right coronary artery.   IMPRESSION:  1. Long-term patency of stented sites to the left anterior descending,      mid-right coronary artery and posterior descending artery.  2. Total occlusion of circumflex with collateral filling from the      right coronary artery.  3. Normal left ventricular function.   RECOMMENDATIONS:  1. Evaluate for other sources of pain.  2. Intensify medical therapy.      Georga Hacking, M.D.  Electronically Signed     WST/MEDQ  D:  04/26/2007  T:  04/26/2007  Job:  [id number]   cc:   Lucky Cowboy, M.D.

## 2010-10-26 NOTE — Assessment & Plan Note (Signed)
[name]                            CARDIOLOGY OFFICE NOTE   Sunny, Gains [name]                   MRN:          [id number]  DATE:07/22/2008                            DOB:          February 10, 1946    PRIMARY CARE PHYSICIAN:  Lucky Cowboy, MD   HISTORY OF PRESENT ILLNESS:  This is a 65 year old with a history of  coronary artery disease status post multiple PCIs in the past as well as  hyperlipidemia, hypertension, and diabetes who presents to Cardiology  Clinic for evaluation of his cardiac problems.  The patient has been  seen by Dr. Donnie Aho in the past and is seen here in our office for the  first time.  His last left heart catheterization was in 2008 and showed  a totally occluded circumflex.  There was collateral flow to the distal  circumflex and the patient's stents in his RCA and his LAD were all  patent.  There was no intervention at that time.  Since then, the  patient has actually done quite well.  He has had no further episodes of  chest pain.  He actually does vigorous yard work, and during the recent  snow he got out and shoveled snow.  With these activities, he has no  chest pain and he also has not been getting any dyspnea on exertion.  He  is able to walk as far as he wants on flat ground and he is able to  climb a flight of steps without shortness of breath.  The patient does  get rare episodes of reflux which tend to be controlled fairly well with  Protonix.  Finally, the patient is not on a statin.  He has not  tolerated multiple statins including LIPITOR, CRESTOR, ZOCOR, and he  thinks PRAVACHOL in the past due to muscle pain.  He is only on Plavix  and not on aspirin because he says he has had some trouble with easy  bleeding and bruising on aspirin in the past.  Finally, the patient did  suffer a fall off his roof in January 2009.  He did have multiple  surgeries after that and actually seems to be recovering pretty  well.   PAST MEDICAL HISTORY:  1. Coronary artery disease.  The patient's last left heart      catheterization was in November 2008.  EF was 55% on left      ventriculogram.  The circumflex was totally occluded after the      first obtuse marginal with collaterals supplying the distal      circumflex.  The LAD showed luminal irregularities.  The stent in      the LAD was patent.  The RCA showed luminal irregularities.  The      stent in the mid RCA and PDA were patent.  There were no      interventions.  Prior to this, the patient has had multiple      percutaneous coronary interventions.  His current stents in the      LAD, RCA, and PDA appear to have been bare-metal stents.  2. Hyperlipidemia.  3. Obesity.  4. Chronic low-back pain.  5. Gastroesophageal reflux disease/hiatal hernia.  The patient did      have a Nissen fundoplication.  6. History of peptic ulcer disease.  7. Type 2 diabetes, this is well controlled.  8. Hypertension, this is well controlled.  9. History of traumatic spinal fracture in January 2009 after falling      off the roof.  10.Cluster headaches.  11.Chronic obstructive pulmonary disease/asthma.  12.Benign prostatic hypertrophy.  13.Osteoarthritis.   FAMILY HISTORY:  The patient's father had coronary artery disease  developed in his 29s.  Mother had congestive heart failure.  He had a  brother who developed congestive heart failure in his 52s.  She had 2  other brothers who had MIs in their 71s.   SOCIAL HISTORY:  The patient is married.  He has 1 child.  He quit  smoking in 1979.  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  He drinks alcohol rarely.   MEDICATIONS:  1. Fish oil 2 g a day.  2. Lyrica 50 mg b.i.d.  3. Plavix 75 mg daily.  4. Nebivolol 5 mg daily.  5. Niacin 500 mg b.i.d.  6. Protonix.  7. Flomax.   Most recent labs in January 2010, creatinine 0.94, triglycerides 230,  LDL 93, HDL 48, hemoglobin A1c 5.5.   Review of  systems is negative except as noted in the history of present  illness.   PHYSICAL EXAMINATION:  VITAL SIGNS:  Blood pressure 128/82, heart rate  is 83 and regular.  GENERAL:  This is a well-developed male in no apparent distress.  NEUROLOGIC:  Alert and oriented x3.  Normal affect.  LUNGS:  Clear to auscultation bilaterally.  Normal respiratory effort.  CARDIOVASCULAR:  Heart regular.  S1 and S2.  There is a soft S4.  No S3.  No murmur.  There is no peripheral edema.  There are 2+ posterior tibial  pulses bilaterally.  There is no carotid bruit.  NECK:  There is no JVD.  There is no thyromegaly or thyroid nodule.  ABDOMEN:  Soft, nontender.  No hepatosplenomegaly.  EXTREMITIES:  There is no clubbing or cyanosis.  HEENT:  Normal exam.  MUSCULOSKELETAL:  Normal exam.  SKIN:  There are multiple surgical scars in the patient's abdomen and on  the patient's back.   ASSESSMENT AND PLAN:  This is a 65 year old with history of coronary  artery disease status post multiple percutaneous coronary interventions  as well as hyperlipidemia, diabetes, hypertension, who presents to  Cardiology Clinic to establish care.  1. Coronary artery disease.  The patient is stable symptomatically      with no ischemic type symptoms.  He had his last left heart      catheterization in November 2008 showing a chronic total occlusion      of the circumflex with collateral flow to the distal circumflex and      showing patent stents in the LAD and the RCA.  We will continue      medical management at this time.  The patient is on Plavix.  I did      suggest that he try taking a low dose of aspirin 81 mg every other      day which he will begin.  If he tolerates this, he can increase it      to every day.  He is also on a beta-blocker and fish oil.  He is      not  on a statin.  We did talk at length about that.  He has had      some difficulty with myalgias on statins in the past.  He has never      tried  fluvastatin and I do find that fluvastatin seems to be about      the least likely one to cause significant muscle pain in my      experience.  Therefore, I am going to start him on fluvastatin 40      mg daily.  We will see if he tolerate that.  If he does, we will      have him get his lipids and liver checked in 3 months.  He says      that Dr. Oneta Rack is planning on doing this in his office.  2. Hypertension.  The patient's blood pressure is under excellent      control at 128/82 today.  We will continue him on nebivolol.  3. Hyperlipidemia.  As mentioned, we are going to start fluvastatin 40      mg daily.  He will continue on his niacin and his fish oil.     Marca Ancona, MD  Electronically Signed    DM/MedQ  DD: 07/22/2008  DT: 07/22/2008  Job #: [id number]   cc:   Lucky Cowboy, M.D.

## 2010-10-26 NOTE — Op Note (Signed)
NAMEHAILEY, [name] NO.:  [id number]   MEDICAL RECORD NO.:  [id number]          PATIENT TYPE:  INP   LOCATION:  0001                         FACILITY:  [facility]   PHYSICIAN:  Ardeth Sportsman, MD     DATE OF BIRTH:  07/08/1945   DATE OF PROCEDURE:  09/03/2007  DATE OF DISCHARGE:                               OPERATIVE REPORT   PRIMARY CARE PHYSICIAN:  Lucky Cowboy, M.D.   GASTROENTEROLOGIST:  Llana Aliment. Randa Evens, M.D.   NEUROSURGERY:  Payton Doughty, M.D.   CARDIOLOGIST:  Georga Hacking, M.D.   SURGEON:  Ardeth Sportsman, M.D.   ASSISTANT:  Leonie Man, M.D. for laparoscopic LOA x 2 hr  Anselm Pancoast. Zachery Dakins, M.D. for [facility] hernia repair, Nissen fundoplication,  laparoscopic cholecystectomy, umbilical hernia and ventral hernia  repairs.   PREOPERATIVE DIAGNOSIS:  1. Recurrent paraesophageal hernia.  2. Gastroesophageal reflux disease secondary to #1.  3. Biliary dyskinesia.  4. Umbilical hernia.   POSTOPERATIVE DIAGNOSIS:  1. Recurrent paraesophageal hernia.  2. Gastroesophageal reflux disease secondary to #1.  3. Biliary dyskinesia.  4. Umbilical hernia.  5. Paramedian ventral wall hernia 1 cm, incarecerated.   PROCEDURE:  Laparoscopic redo paraesophageal hernia repair  Laparoscopic redo Nissen fundoplication.  Laparoscopic cholecystectomy & IOC  Open umbilical hernia repair  Open VWH repair   SPECIMENS:  Gallbladder.   DRAINS:  32 [place] Blake drain with the tip resting in the posterior  mediastinum and comes out on top of the fundoplication, goes toward the  gallbladder fossa and then out the right upper quadrant port.   ESTIMATED BLOOD LOSS:  Less than 100 mL.   COMPLICATIONS:  There was a small gastrotomy primarily repaired.   INDICATIONS FOR PROCEDURE:  The patient is a 65 year old male with  worsening heartburn and reflux accelerating over the past 4 years  despite maximal medical therapy.  He had evaluation which showed  evidence of  moderate size paraesophageal hernia by upper GI as well as  endoscopy.  He did not have any significant ulceration.  He had  manometry which showed adequate peristalsis and gastric emptying study  that was normal.  He did have decreased gallbladder ejection fraction in  the 30s with some symptoms of bloating as well.  He also had a known  umbilical hernia.   PATHOPHYSIOLOGY:  The patient had a prior open Nissen fundoplication  done 30 years ago.  Paraesophageal herniation and reflux with its risks  of worsening pain, aspiration pneumonia, bleeding, Cameron ulcers, and  other risks were discussed.  Risk of biliary dyskinesia with risks of  acalculous cholecystitis, prolonged pain, nausea and vomiting, risks of  incarceration and strangulation with the umbilical hernia was discussed  as well.  Options were discussed and recommendation was made for  laparoscopic redo paraesophageal hernia repair and redo Nissen  fundoplication.  Note, this is a redo.  After discussion and  recommendation was made for lysis of adhesions and takedown of the  paraesophageal hernia and redo of the Nissen fundoplication.  Because it  is a recurrence and he is only in his early 73s, I  recommended  biological normal mesh reinforcement to decrease the risk of herniation.  I also recommended cholecystectomy given the fact that he is having  symptoms of biliary dyskinesia as well.  Umbilical hernia and I  recommended given his prior surgeries, I recommend diagnostic  laparoscopy with probable primary umbilical hernia repair since it  seemed to be about 1 cm within the sac itself.   The risks of stroke, heart attack, deep venous thrombosis, pulmonary  embolism, and death were discussed.  Risks such as bleeding, need for  transfusion, hematoma, seroma, wound infection, abscess, injury to other  organs, and prolonged pain were discussed.  Need for liquid and pureed  diet for the first 2 weeks, possible risks of leak,  need for  reoperation, recurrent herniation, bile leak, need for drainage and/or  stenting, hernia recurrence, and numerous other risks were discussed.  Questions were answered and he and his family agreed to proceed.   FINDINGS:  He had a moderate paraesophageal hernia with about a third of  his stomach in the posterior mediastinum.  He did have a fundoplication  that was mostly intact.  He had dense anterior abdominal wall adhesions  of primarily omentum but also of small bowel.  He had a 1 cm umbilical  hernia through the stalk.  He had a 1 cm paramedian hernia between his  midline and left lower quadrant paramedian incisions at the bridge area  that was about 1 cm as well.  There was some omentum within it.  His  gallbladder had some moderate omental adhesions, but no major  gallbladder wall thickening.  His biliary system overall was narrow, but  no evidence of any obstruction or leak.   DESCRIPTION OF PROCEDURE:  Informed consent was obtained.  The patient  received IV Cipro and gentamicin given his penicillin and other  allergies.  He had compression devices activated during the entire case.  He underwent general anesthesia without any difficulty.  He was  positioned on a split-leg table with arms tucked.  He had a Foley  catheter sterilely placed.  His abdomen was prepped and draped in the  usual sterile fashion.   A 5 mm port was placed in the right upper quadrant using optical entry  technique and with the patient in steep reversed Trendelenburg right  side up.  Capnoperitoneum to 15 mmHg provided good abdominal  insufflation.  Under direct visualization 5 mm ports were placed in the  right flank and right lower quadrant given the dense intra-abdominal  adhesions.  Sharp dissection was done to free some of the omentum down  as well as some ultrasonic dissection.  Enough working space was created  such that I could get a 5 mm port in the lower left upper quadrant.  Also I got  a 10 mm port placed in the left subcostal ridge in the  midclavicular line as well.  With further dissection, I was able to free  the omentum, falciform ligament as well as numerous loops of small bowel  off the anterior abdominal wall.  I primarily used cold scissors, but  occasionally used ultrasonic dissection in the heavier parts of the  omentum away from small bowel.  Hemostasis was excellent.  Note, that a  Nathanson liver retractor was used to help lift the left lateral sector  of the liver anteriorly.   Attention was turned to paraesophageal hernia since that was his main  complaint.  I went ahead and took the short gastric from the  mid part of  the greater curvature of the stomach and reflected the stomach medially.  This helped identify the left crus.  The stomach was freed off of the  left crus using primarily cold scissors to help identify left crus.  I  was able to find its apex and follow it all the way down posteriorly.  There was some retroperitoneal attachments to the stomach and these were  freed off carefully.  Sharp dissection was used to go along the lesser  curvature of the stomach and help free the anterior part of the stomach  off of the liver bed using primary cold scissors as well as some focused  ultrasonic dissection.  With this, I was able to identify the right crus  near its apex and free the stomach off the right crus inferiorly.  We  got into a large posterior mediastinal window and a fair amount of  stomach there.  Using careful primary blunt as well as controlled  ultrasonic dissection, I was able to help reduce the stomach with wrap  into the abdomen.   A type 2 mediastinal dissection was done to help free the esophagus.  I  actually could see both anterior and posterior vagi and they were spared  any injury during the course of the surgery.  They could be clearly  seen.  Further dissection was done further up the more proximal  mediastinum until I go  good intra-abdominal length of probably about 3  cm above the wrap.  Careful dissection was done to help free the right  part of the wrap off the esophagus and the left part of the wrap.  In  doing that I did get a small gastrotomy on the right side of the old  wrap.  This was easily seen and controlled using a 3-0 PDS figure-of-  eight stitch with good results.  Further dissection was done and I was  able to take the wrap completely out and free the cardia off the  attachment to the posterior esophagus and stomach and totally open the  area up.  I was able to do a little more circumferential dissection up  to the mediastinum to make sure I had good intra-abdominal length and I  had probably about 5 cm of intra-abdominal esophageal length off  tension.  Hemostasis was good.  I could see the crura well.  The left part of the stomach at the cardia was brought back around  posteriorly in the retroesophageal window to set up the right side of  the wrap.  The remaining proximal left side of the stomach at the short  gastrics was grasped, elevated, and classic shoe shine maneuver was  done.  The right side of the wrap was rolled further anteriorly to  better expose the crura.   The crura was reapproximated using #1 Ethibond horizontal mattress  sutures using 1 x 2 cm pledgets x3.  This helped close the hiatus down  well with not too much tension.  Given the fact that this was redo and  there was no good fascia overlying the crura at this point, I was aware  that stitches might pull through and with a redo, I went ahead and used  8 x 12 cm ultra thick flex HD biologic mesh as a buttress reinforcement  to reduce recurrence of the PEH.  I cut the mesh into a U shape.  It was  placed posteriorly.  I used a 3 mm punch biopsy to punch  holes through  the mesh to allow the easier to pass stitches through.  It was placed  with the rough side up on the diaphragm side and placed so that the  underside of  the U was placing a posterior reinforcement.  The tails of  the U were tagged on the anterior diaphragm just lateral to the left  crus and the right crus respectively.  This was done using 0 Ethibond  interrupted stitches x2.  The mesh was secured posteriorly using #1  Ethibond stitches through and through, through the mesh, through both  crura, and then out the mesh and tied down.  Then this stitch was used  to tie down the posterior part of the wrap for a good posterior  rectopexy x2 with good result.   The most superior short gastric on the left side of the wrap was tacked  to the left anterior aspect of the crux using a 0 Ethibond stitch.  This  helped the wrap well.  0 Ethibond stitches were used for internal  plication (Guraner) stitches for esophagogastric stitches at 3 o'clock  and 9 o'clock respectively.  Anesthesia passed a 56 bougie under direct  visualization and esophageal axial tension without any difficulty.  A  three stitch Nissen was done for a total length of 2 cm, taking a bite  of the left side of the stomach wrap, anterior esophagus, and the right  side of the stomach wrap.  It was tied down.  It was measured to be 2  cm.  The bougie was removed.   Irrigation was done and hemostasis was excellent.  I could easily put a  blunt grasper underneath the wrap as well as put a blunt grasper up into  the mediastinum, providing a snug but not over tight fundoplication and  paraesophageal hiatal closure.   Attention was turned to the cholecystectomy.  Gallbladder fundus was  grasped and elevated cephalad.  Some omental and some duodenal  attachments were carefully bluntly swept away.  Omental attachments were  freed off using ultrasonic dissection.  Dissection was done to free the  proximal half of the gallbladder off the liver.  Good circumferential  dissection was done around the infundibulum of the gallbladder until I  got a critical view.  I could see the anterior and  posterior branches of  the cystic artery and these were ligated using ultrasonic dissection  high on the gallbladder side.  Further skeletonization revealed one  structure going from the infundibulum down to the porta hepatis  consistent with the cystic duct.   Clip was placed on the gallbladder at the infundibulum and a partial  cystic ductotomy was performed.  A 5 [place] cholangiocatheter was placed  through a right subcostal stab incision flush and attempted to pass into  the cystic duct.  However, I could barely get it in at about 5 mm when I  advanced, therefore, I transected the cystic duct about 1 cm more  proximally toward the porta since it was a long cystic duct.  The  cholangiocatheter could easily pass down the cystic duct there.  Cholangiogram was run using diluted radio opaque contrast and continuous  fluoroscopy.  Contrast flowed in well from a side branch into a narrow  common hepatic branch.  It went into the right and left intrahepatic  chains which were somewhat narrowed, but I would not say extremely  narrowed.  It flowed down a normal common bile duct and refluxed up the  pancreatic  duct and back down across the normal ampulla into the  duodenum consistent with a normal cholangiogram.  There was no evidence  of any leak or other abnormality.  The catheter was removed.  Four clips  were placed on the cystic duct and cystic duct transection was  completed.  The gallbladder was freed from its remaining attachments on  the liver bed, placed in an EndoCatch bag and removed out the left upper  quadrant 10 mm port without any difficulty.   Copious irrigation was done and hemostasis was excellent on the  gallbladder fossa as well as around the paraesophageal hernia.  Camera  inspection was done and I could see the umbilical hernia that was easily  palpable on physical examination.  However, on examination, I could see  about a 1 cm ventral hernia in the paramedian region on  the left side.  Omentum was freed out of it.  It actually was right at the small area  between the left lower quadrant paramedian and the upper midline  incisions where there was about a 2 cm bridge that seemed to be the weak  spot.  They were rather small.  Because there had been a gastrotomy and  a cholecystectomy, I am worried about permanent mesh in and I did not think absorbable mesh would act as appropriate bridge.  They were small  so I thought primary repair would be reasonable since he was only mildly  symptomatic from his umbilical hernia.  Therefore capnoperitoneum was  evacuated and ports were removed.   Attention was turned to the umbilical hernia.  A 2 cm curvilinear  incision was made infraumbilically and umbilical stalk was free off the  fascia.  A 1 cm fascial defect was closed using 0 Ethibond figure-of-  eight stitch and a 0 Vicryl stitch with good results.  The 0 Vicryl  stitch was used to help tag the umbilical stalk back down.  This  provided good repair.   About a 3 cm incision was made in the lower midline in the  supraumbilical region and got down below the fascia.  I could feel a  little wad of omentum that had come up and this was pushed back down and  a small fascial defect could be seen a little less than 1 cm.  It was  closed also with 0 Ethibond interrupted stitches x3 actually.   The skin was closed using 4-0 Monocryl stitch in all ports.  Note the  [facility] liver retractor had been removed under direct visualization  without any difficulty.  The left upper quadrant defect had slid up on  the ribs, but he was a rather thin man with poor fascial tissue, so I  went ahead and closed it primarily using a 0 Vicryl figure-of-eight  stitch with good results.  Note, a 83 [place] Blake drain had been placed  as noted above and secured to the skin  using a 2-0 nylon stitch and to the right mid abdomen incision port  site.  The patient was extubated and sent to  the recovery room in stable  condition.   I am about to explain the operative findings to the patient's family.      Ardeth Sportsman, MD  Electronically Signed     SCG/MEDQ  D:  09/03/2007  T:  09/03/2007  Job:  [id number]   cc:   Fayrene Fearing L. Malon Kindle., M.D.  Fax: [phone]   Lucky Cowboy, M.D.  Fax: [phone]   Loraine Leriche  Delora Fuel, M.D.  Fax: [phone]   W. Viann Fish, M.D.  Fax: [phone]  Email: [email or web address]

## 2010-10-26 NOTE — H&P (Signed)
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  He stepped back and fell  off the roof.  He does not know if he lost consciousness.  He said he  laid there for about 15 minutes and then called for help.  He was  brought to the emergency department by EMS.  He is complaining of upper  back pain.  He is left hand dominant.   PAST MEDICAL HISTORY:  1. Coronary artery disease status post stent placement.  2. Hypertension.  3. Hyperlipidemia.  4. Upper GI bleed.  5. Lower lumbar spine disease.   PREVIOUS OPERATIONS:  1. Hiatal hernia repair.  2. Lumbar spine surgery four times last time being by Dr. Altamease Oiler in      2001.  3. Right temporal artery biopsy.   SOCIAL HISTORY:  Married.  His wife is with him.  He is a former smoker.  No current tobacco use.  No alcohol use.  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   ALLERGIES:  PENICILLIN.   MEDICATIONS:  Metoprolol ER, aspirin, Plavix, Crestor, nitroglycerin  p.r.n., prednisone p.r.n. headaches.   REVIEW OF SYSTEMS:  Intermittently he had some exertional chest pain,  but on a recent cardiac catheterization no significant disease was found  per his wife.  Currently neurologically he has no paresthesias.   PHYSICAL EXAMINATION:  GENERAL:  A well-developed male who is  immobilized.  He is in no acute distress.  VITAL SIGNS:  Temperature 97.9, pulse 81, blood pressure 136/57, O2 sats  97% on mask oxygen.  HEENT:  Normocephalic.  He has some nasal deformity and dried blood in  both nares.  He also has  dried blood in his external ear canals.  Pupils  are 4 mm and react slightly (he has had narcotic).  Extraocular motions  intact.  NECK:  There is a lower neck tenderness in the midline to palpation.  Cervical collar is on.  Trachea is midline.  RESPIRATORY:  Breath sounds  are equal and clear.  She had no chest wall contusions.  CARDIOVASCULAR:  Regular rate, regular rhythm.  ABDOMEN:  Upper midline scar with reducible bulge.  There is left  paramedian scar as well.  There is no tenderness.  No abrasions or  contusions.  PELVIS:  No tenderness or instability.  MUSCULOSKELETAL:  The right forearm is splinted.  No palpable  deformities in the other extremities.  BACK:  There is upper T-spine tenderness.  NEUROLOGIC:  He is alert and oriented x3.  Has normal lower extremity  motor strength.   LABORATORY DATA:  Electrolytes within normal limits except for slightly  elevated glucose 108  and slightly decreased sodium 134.  Hemoglobin 16,  INR 1.   Chest x-ray:  No rib fracture, pneumothorax.  Pelvis x-ray:  No acute  fracture.  Extremity x-ray:  Right wrist demonstrates a distal radius  fracture that is comminuted  and possibly a single wrist bone fracture.  CT of the head demonstrates no intracranial bleed.  There is a skull-  based back fracture.  CT of the neck demonstrates a T1 spinous process  fracture.  CT of the chest:  No great vessel injury.  There are T2-T4  fractures with associated hematoma.  There is retropulsion at T3 level.  CT of abdomen/pelvis:  No acute solid organ injury or free fluid.  Hiatal hernias noted.  Multiple clips noted as well up in the upper  abdomen.   IMPRESSION:  1. Skull base fracture - neurologically intact.  2. T1-T4 fractures as above.  3. Right wrist fracture.  4. Coronary artery disease.  5. Hypertension.  6. Diabetes.   PLAN:  Admit to the hospital.  Will obtain orthopedic, neurosurgery, and  maxillofacial surgery consultations.  It has  been discussed with him and  his wife.      Adolph Pollack, M.D.  Electronically Signed     TJR/MEDQ  D:  06/03/2007  T:  06/04/2007  Job:  [id number]   cc:   Georga Hacking, M.D.  James L. Malon Kindle., M.D.  Lucky Cowboy, M.D.

## 2010-10-26 NOTE — Op Note (Signed)
NAMERASHAD, AULD            ACCOUNT NO.:  [id number]   MEDICAL RECORD NO.:  [id number]          PATIENT TYPE:  AMB   LOCATION:  ENDO                         FACILITY:  [facility]   PHYSICIAN:  James L. Malon Kindle., M.D.DATE OF BIRTH:  11-Feb-1946   DATE OF PROCEDURE:  07/19/2007  DATE OF DISCHARGE:  07/19/2007                               OPERATIVE REPORT   PROCEDURE:  Esophageal manometry   INDICATIONS:  The patient has had a previous Nissen and is being  considered for revision due to a paraesophageal hiatal hernia. This is  done to make sure that he does not have any particular problems prior to  the revision.   DESCRIPTION OF PROCEDURE:  The procedure was performed in the usual  manner at the [facility].  There were no provocative  measures.  The results are as follows:   Upper esophageal sphincter spikes were present.  The sphincter appears  grossly normal.  Lower esophageal sphincter mean pressure measured by  the computer was 24 and it appears to relax but not completely to  baseline.  The esophageal body mean amplitude 92 mm and the distal  esophagus with a mean duration of 4 seconds. Peristalsis appears normal  in most swallows obtained.   ASSESSMENT:  Grossly normal manometry.  This is a somewhat poor study,  some of the wet swallows have more prominent ways than others, but they  appear peristaltic in all.   PLAN:  The patient will see Dr. Karie Soda to consider revision of his  Nissen.           ______________________________  Llana Aliment Malon Kindle., M.D.     Waldron Session  D:  07/26/2007  T:  07/28/2007  Job:  [id number]   cc:   Ardeth Sportsman, MD  Lucky Cowboy, M.D.

## 2010-10-29 NOTE — H&P (Signed)
[place]. [facility]  Patient:    [name], [name]                   MRN: [id number] Adm. Date:  [id number] Attending:  Orland Mustard CC:         Marinus Maw, M.D.             Darden Palmer., M.D.                         History and Physical  REASON FOR ADMISSION:  Gastrointestinal bleed.  HISTORY OF PRESENT ILLNESS:  The patient is a 65 year old gentleman who had been in his usual state of health, doing quite well until approximately three days ago. He became weak and dizzy.  He was having difficulty walking, had dark stools for two days.  He had shortness of breath with exertion, etc., no bright red blood, no abdominal pain, heartburn, dyspepsia.  He presented to Dr. Tawana Scale office, was orthostatic with a systolic blood pressure going from 100 to 60 with standing and nearly passed out.  His hemoglobin was 9.8 and stools were positive.  For these  reasons he is admitted to the hospital.  He did have a normal ECG in Dr. Tawana Scale office and has not had any chest pain, and had a previous Cardiolite test that as negative, and Dr. Donnie Aho thought that this was not cardiac, and we agreed to admit Mr. Rocha to the hospital.  CURRENT MEDICATIONS: 1. Zocor 80 mg q.d. 2. Vanceril inhaler two puffs b.i.d. 3. Serevent two puffs b.i.d. 4. Vitamins one daily. 5. Aspirin one daily.  ALLERGIES:  PENICILLIN.  PAST MEDICAL HISTORY: 1. History of peptic ulcer disease many years ago. 2. Gastroesophageal reflux disease, status post Nissen fundoplication 20 years    ago by Dr. Maple Hudson. 3. Coronary artery disease, status post PTCA x 4 with a stent with a negative    Cardiolite recently, and negative ECG in Dr. Tawana Scale office. 4. COPD followed by Dr. Oneta Rack.  PAST SURGICAL HISTORY:  Included Nissen fundoplication and back surgery.  FAMILY HISTORY:  Both mother and father died of heart disease.  No family history of GI, cancer.  SOCIAL  HISTORY:  He does not smoke nor drink currently.  He is a Veterinary surgeon, is married.  REVIEW OF SYSTEMS:  Remarkable for absence of any nonsteroidal drugs.  PHYSICAL EXAMINATION:  VITAL SIGNS:  Temperature 98, blood pressure 116/80, pulse 120.  GENERAL:  He is a pleasant white male, nonicteric, in no acute distress.  HEART:  Regular rate and rhythm without murmurs or gallops.  LUNGS:  Clear.  ABDOMEN:  Soft, completely nontender with a surgical scar.  RECTAL:  Stool is positive by Dr. Donnie Aho.  Rectal examination is not repeated.  ASSESSMENT: 1. Acute gastrointestinal bleed, hopefully an upper gastrointestinal bleed. The    patient is currently stable.  I think we should move ahead with an endoscopy. 2. Coronary artery disease, status post percutaneous transluminal coronary    angioplasty with stents with negative Cardiolite recently. 3. Chronic obstructive pulmonary disease. 4. Gastroesophageal reflux disease, status post Nissen fundoplication. 5. History of glucose intolerance, diet controlled.  PLAN:  We will proceed with an endoscopy at this time.  The patient is agreeable. We will discuss the procedure, including the risks and benefits with him. DD:  08/24/99 TD:  08/24/99 Job: [id number]

## 2010-10-29 NOTE — Procedures (Signed)
[place]. [facility]  Patient:    [name], [name]                   MRN: [id number] Proc. Date: 08/24/99 Adm. Date:  [id number] Attending:  Orland Mustard CC:         Marinus Maw, M.D.             Darden Palmer., M.D.                           Procedure Report  PROCEDURE PERFORMED:  Esophagogastroduodenscopy.  ENDOSCOPIST:  Llana Aliment. Edwards, M.D.  MEDICATIONS:  Hurricaine spray, fentanyl 40 mcg, Versed 4 mg IV.  INDICATIONS:  Acute gastrointestinal bleed with drop in hemoglobin, weakness and dizziness.  This is done to look for an upper GI source.  DESCRIPTION OF PROCEDURE:  The procedure had been explained to the patient and consent obtained.  With the patient in the left lateral decubitus position, the  Olympus video endoscope was inserted blindly into the esophagus and advanced under direct visualization.  The patient had previous fundoplication.  The wrap was intact.  The esophagus was completely normal with no signs of active bleeding. The stomach was entered, pylorus identified and passed.  There were no signs of active bleeding in the duodenum.  Down in the second portion he had mild to moderate duodenitis and no frank ulcerations.  The scope was withdrawn back into the stomach.  The pyloric channel and antrum were seen well and were normal. Fundus and cardia were seen well on the retroflex view and were normal.  Again, there as a Nissen fundoplication with essentially a normal esophagus.  The scope was withdrawn.  The patient tolerated the procedure well and was maintained on low-flow oxygen and pulse oximetry throughout the procedure with no obvious problem.  ASSESSMENT:  Mild duodenitis--doubt this is the cause of his bleeding.  May have bled from some other source.  Appears clinically stable.  PLAN:  Will follow clinically. He will probably need his colon examined in the future. DD:  08/24/99 TD:   08/25/99 Job: [id number]

## 2010-10-29 NOTE — Op Note (Signed)
NAMEBRICESON, BROADWATER NO.:  [id number]   MEDICAL RECORD NO.:  [id number]          PATIENT TYPE:  AMB   LOCATION:  DSC                          FACILITY:  [facility]   PHYSICIAN:  Gita Kudo, M.D. DATE OF BIRTH:  1946-06-02   DATE OF PROCEDURE:  03/22/2004  DATE OF DISCHARGE:                                 OPERATIVE REPORT   PREOPERATIVE DIAGNOSIS:  Possible temporal arteritis.   POSTOPERATIVE DIAGNOSIS:  Pending pathology.   OPERATIVE PROCEDURE:  Right temporal artery biopsy.   SURGEON:  Gita Kudo, M.D.   ANESTHESIA:  Local, 1% Xylocaine.   OPERATIVE PROCEDURE:  The patient was positioned, prepped and draped in a  standard fashion.  Approximately 20 mL of 1% Xylocaine was used during the  procedure for good analgesia.  A curved S-shaped incision made in front of  the right ear and carried down to the deep fascia.  Retainers were placed  and the temporal artery carefully identified.  It was positively identified  with a Doppler and then a segment removed between ties of 3-0 chromic.  Specimen sent.  The wound closed in layers with 3-0 chromic and 4-0 nylon.  Dressing applied.  No complications.  To the PACU, to be discharged.       MRL/MEDQ  D:  03/22/2004  T:  03/22/2004  Job:  [id number]   cc:   Molly Maduro L. Dione Booze, M.D.  [phone] [address]  [place]  [place] [address]  Fax: [phone]   Lucky Cowboy, M.D.  [address]  [place], [place] [address]  Fax: [phone]

## 2010-10-29 NOTE — H&P (Signed)
[facility]  Patient:    [name], [name]                   MRN: [id number] Adm. Date:  [id number] Attending:  Charlton Haws CC:         Marinus Maw, M.D.  Darden Palmer., M.D.   History and Physical  CHIEF COMPLAINT:  Abdominal pain and nausea.  HISTORY OF PRESENT ILLNESS:  This is a 65 year old white man, who was feeling well yesterday morning.  Yesterday evening, he developed some upper abdominal bloating and burning sensation.  He went to bed and awoke at 2 a.m. with more severe, steady midabdominal pain and nausea.  He has had dry heaves but has been unable to vomit, presumably because of a prior history of antireflux surgery.  He has not had any diarrhea recently.  He has had no bowel movements for 36 hours.  He states he feels better now.  He states that for 20 years, he has had problems with intermittent constipation and "blockage," which usually resolves with taking enemas and laxatives.  He took several enemas and a bottle of magnesium citrate this morning and a suppository and only had a very tiny stool and did not relieve his symptoms.  He came to the emergency room for evaluation, and here in the emergency room his abdominal x-ray showed gastric distention and a partial small bowel obstruction.  White blood cell count is elevated at 12,000, and potassium is down to 2.9.  We were asked to evaluate him, and he is admitted for further management.  PAST MEDICAL HISTORY:  Coronary artery disease.  He is status post PTCA with stenting x 3 by Dr. Donnie Aho.  He had a hiatal hernia repair and possibly a vagotomy and pyloroplasty in 1979 by Dr. Lyndel Safe.  He had posterior lumbar spinal surgery in 2000 at [facility].  That surgery failed, and he had to go back to [facility] for anterior and posterior approach for insertion of a ray cage in May 2001.  He had problems postoperatively with wound infections. He has COPD  and asthma.  He has noninsulin-dependent diabetes.  He has hypertension.  MEDICATIONS:  Zocor 80 mg q.d., enteric-coated aspirin 325 mg q.d., potassium chloride tablet one a day (dose unknown), Serevent MDI inhaler p.r.n., albuterol MDI inhaler p.r.n.  DRUG ALLERGIES:  PENICILLIN, SULFA, CODEINE, DILAUDID, MORPHINE.  SOCIAL HISTORY:  The patient is married.  He had a 40 pack-year history of tobacco abuse, quit 23 years ago.  History of alcohol use, quit 23 years ago. No illicit drug abuse.  He works as a Education officer, community.  FAMILY HISTORY:  Mother and father are deceased secondary to congestive heart failure.  Positive family history of hypertension, diabetes, and coronary artery disease.  Negative family history for stroke or cancer.  REVIEW OF SYSTEMS:  All systems are reviewed and are unremarkable except as described above.  PHYSICAL EXAMINATION:  GENERAL:  A pleasant middle-aged gentleman, who is somewhat overweight and in only mild distress.  VITAL SIGNS:  Temperature 96.9, heart rate 116, blood pressure 150/108, respiratory rate 24.  HEENT:  Sclerae are clear.  Extraocular movements intact.  Oropharynx clear.  NECK:  Supple, nontender, no mass, no adenopathy, no bruit.  LUNGS:  Clear to auscultation.  Somewhat diminished breath sounds at the bases but no wheezing.  HEART:  Regular rate and rhythm, no murmur.  ABDOMEN:  Protuberant.  Bowel sounds are diminished.  In  fact, bowel sounds were rare.  He has several well-healed scars, no hernias.  The abdomen is soft but distended, and he has mild diffuse tenderness but no peritoneal signs. Clearly no hernias anywhere.  GENITALIA:  Normal male pattern.  RECTAL:  Not performed.  EXTREMITIES:  No edema, good pulses.  NEUROLOGIC:  Grossly within normal limits.  IMPRESSION: 1. Partial small bowel obstruction. 2. Hypokalemia, question of whether this is exacerbating his distention from    ileus and gastroparesis. 3.  Noninsulin-dependent diabetes mellitus. 4. Coronary artery disease. 5. Status post antireflux surgery and possibly ulcer surgery. 6. Status post lumbar spinal surgery x 2, most recently May 2001.  PLAN:  The patient will be admitted for IV fluid rehydration, potassium repletion, and nasogastric tube decompression.  Hopefully with bowel rest, his distention and obstruction will resolve.  We will repeat lab work and abdominal x-rays tomorrow. DD:  03/13/00 TD:  03/13/00 Job: [id number]

## 2010-10-29 NOTE — Op Note (Signed)
[place]. [facility]  Patient:    [name], [name]                   MRN: [id number] Proc. Date: 05/31/00 Adm. Date:  [id number] Attending:  Donn Pierini                           Operative Report  SERVICE:  Neurosurgery.  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  Status post L5-S1 fusion with instrumentation.  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  Status post L5-S1 fusion with instrumentation.  PROCEDURE:  Re-exploration of L4-5 decompressive laminectomy with re-do L4-L5 foraminotomies.  Bilateral L4-5 microdiskectomies.  Posterior lumbar interbody fusion, utilizing Synthes interbody spacers, and local autograft. Posterolateral fusion from L4-5.  Removal of L5-S1 hardware.  Placement of segmental instrumentation from L5 to S1.  SURGEON:  Julio Sicks, M.D.  ASSISTANT:  Donalee Citrin, M.D.  ANESTHESIA:  General endotracheal anesthesia.  INDICATIONS:  [name] is a 65 year old male who is status post a previous L5-S1 decompression and fusion done in a outside hospital.  (It should be noted that the patient also possesses a transitional vertebrae one space below this, previously on his operative notes from [facility].  The L5-S1 disk space has been referred to as L4-5, I will however, refer to it as L5-S1 because I think that is more closely approximating what this segment represents anatomically).  His L5-S1 fusion was complicated by pseudoarthrosis and screw breakage.  The patient underwent re-exploration of his fusion with replacement of his posterolateral hardware and a simultaneous anterior lumbar interbody fusion utilizing BAK instrumentation.  Postoperatively from this the patient had done reasonably well, however, now he presents with worsening back and right lower extremity pain consistent with a  right-sided L4 radiculopathy which has failed conservative management including epidural steroid injections.  CT scanning, CT myelography and plane films demonstrate lateral translation of L4 and L5 with severe disk space collapse and evidence of facet joint disruption.  There is resultant stenosis despite his previous decompressive laminectomy. We discussed options of further management including the possibility of re-exploring his fusion and performing a decompression and fusion at L4-5 with incorporation to its previous instrumentation.  The patient wishes to proceed.  DESCRIPTION OF PROCEDURE:  The patient was taken to the operating room and placed on the operating table in the supine position.  After an adequate level of anesthesia was achieved, the patient was positioned prone on to Wilson frame and appropriately padded.  The patients lumbar region was shaved and prepped sterilely.  A 10 blade was used to make a linear skin incision extending from approximately L3 down to the mid sacrum.  This was carried down sharply in the midline.  A subperiosteal dissection was then performed bilaterally exposing the lamina of L3, the previous laminectomy at L4-5, the hardware at L5 and S1.  The ______ stainless steel instrumentation was completely dissected free using leksell rongeurs and the electrocautery.  The transverse processes of L4 were identified bilaterally and dissected free using electrocautery.  A deep self-retaining retractor was placed.  The patient previous laminectomy defect was dissected free using dental instruments.  The laminectomy defect was widened.  Complete inferior fasciectomies of L4 were performed.  Superior fasciectomies at L5 were also performed.  The exiting L4 and L5 nerve roots were identified bilaterally. These nerve roots were widely decompressed.  It should be noted that the right-sided L4 nerve root was greatly compressed prior to this.  The epidural  venous plexus was coagulated and cut.  Starting first on the patients left side the disk space was isolated and incised with a 15 blade in a rectangular fashion.  A wide disk space clean out was then achieved using pituitary rongeurs, upward angled pituitary rongeurs, and Epstein curets.  The disk space dilated sequentially.  Attention was then placed to the contralateral side.  Once again, the disk space was isolated and incised with a 15 blade.  An aggressive diskectomy was performed on the contralateral side.  The disk space was sized and found to be appropriate for a 15 mm Synthes spacers.  With the nerve roots and thecal sac protected the disk space was reamed and the end plates were cut until fresh bone was encountered.  A 15 mm Synthes spacer was then impacted into place under fluoroscopic guidance and found to be well positioned.  The distractor was then removed from the contralateral side.  The procedure was then repeated on the contralateral side.  Prior to installation of the second bone graft, morcellized autograft was packed into the interspace.  Once again, final images revealed good positioning of both interbody prosthesis and bone graft with normal alignment of the spine.  The patients lateral translation was significantly corrected by this maneuver.  The pedicles of L4 were then isolated bilaterally.  The posterior cortical margin was then drilled using a high-speed drill. The pedicles were then probed using a pedicle awl under fluoroscopic guidance.  Each awl track was found to be solidly within bone.  Each awl track was then tapped with 5.5 mm screw tap.  Then 6.25 x 45 mm ______  ______ ______ screws were then placed bilaterally under fluoroscopic guidance.  Stainless steel screws were used because the existing hardware was made of stainless steel.  Both screws were well positioned under fluoroscopic guidance.  The patients rod between L5 and S1 was removed.  A new  segment of 1/4 inch stainless steel rod was then contoured appropriately and placed through connectors at L4, L5 and S1.  This was then tightened down in a usual manner.  Prior to complete tightening  at L4-L5 the motion segment was placed under compression and a final tightening was done.  A transverse connector was applied.  Final images revealed good position of the bone graft and hardware at the proper optimum level with normal alignment of the spine in both the AP and lateral planes.  The transverse process and posterolateral fusion mass were then decorticated using the high-speed drill.  Morcellized autograft mixed with vitoss bone graft substitute was then packed posterolaterally.  The spinal canal was inspected for any evidence of any further compression. None was found.  Wound was irrigated one final time with antibiotic solution. Gelfoam was placed topically over the thecal sac for hemostasis which was found to be good.  A medium Hemovac drain was left in the epidural space.  The wound was then closed in a typical fashion using Vicryl sutures.  Steri-Strips were applied to the surface.  There were no apparent complications.  The patient tolerated the procedure well and was returned to the recovery room postoperatively. DD:  05/31/00 TD:  06/01/00 Job: [id number]

## 2010-10-29 NOTE — Discharge Summary (Signed)
[name], [name] NO.:  [id number]   MEDICAL RECORD NO.:  [id number]          PATIENT TYPE:  OIB   LOCATION:  6525                         FACILITY:  [facility]   PHYSICIAN:  Georga Hacking, M.D.DATE OF BIRTH:  May 04, 1946   DATE OF ADMISSION:  06/26/2006  DATE OF DISCHARGE:  06/27/2006                               DISCHARGE SUMMARY   FINAL DIAGNOSES:  1. Coronary artery disease with progressive angina.      a.     Stenting done of the mid posterior descending coronary       artery this admission with a 2.75 x 13-mm Liberte stent.      b.     Long-term patency of stented site to the left anterior       descending and the right coronary artery proximally.      c.     Total occlusion of the circumflex with well-developed       collaterals from the right coronary artery system.  2. Hyperlipidemia, under treatment.  3. Obesity.  4. Chronic low back pain.   HISTORY:  [age]-year-old male with known coronary artery disease  with stenting of the LAD in 1998 and PTCA of the right coronary artery  in 1999 for a total occlusion.  He subsequently had normal caths and  negative stress tests.  He presented with a 10-day history of  progressive angina similar previous episodes.  He saw as primary doctor  and was referred to the office and was brought in for same-day  catheterization.  Please see the typed records for review of the  history.   HOSPITAL COURSE:  Labs done on admission showed a normal chemistry  panel; sodium is 138, potassium 3.7, chloride 103, CO2 28, glucose 89,  BUN 15, creatinine 0.88.  GFR was greater than 60.  Lipid profile shows  a total cholesterol of 137, triglycerides of 142, HDL of 46 and LDL  cholesterol of 63.  PT and PTT were normal.  The patient was brought to  the cath lab and had a catheterization performed through the right  groin.  Left ventricular function was normal.  The left anterior  descending stent was patent.  The circumflex  was demonstrated be totally  occluded since the previous catheterization with a well-developed system  of collaterals from the right coronary artery.  The previously placed  stent in the mid right coronary artery was widely patent, but there was  a new stenosis in the mid posterior descending artery.  He underwent  stenting of this by Dr. Peter [place]il[place] with a 2.75 x 12-mm Liberte stent,  which was postdilated with a 3.0 x 12-mm Quantum balloon.  He tolerated  this well with 0% residual stenosis.  Catheterization site was fine  following the catheterization.  He was ambulatory in the hall the next  day and was to be seen by Rehab.  It is uncertain whether he will have  complete relief of symptoms due to the unknown factor of not knowing how  long the circumflex has been occluded.  It is recommended that this be  treated medically,  as angiographically this appeared to be somewhat more  problematic to open back up.  He will be seen by Cardiac Rehab and was  instructed to enter cardiac rehab.   DISCHARGE MEDICATIONS:  He is discharged at this time on:  1. Aspirin 325 mg daily.  2. Plavix 75 mg daily.  3. Crestor 20 mg daily.  4. Metoprolol ER 100 mg daily.  5. Nitroglycerin p.r.n.  6. Prednisone as needed for headaches.   FOLLOWUP:  It is recommended that he follow up in 1 week at the office  and is to interface through rehab.      Georga Hacking, M.D.  Electronically Signed     WST/MEDQ  D:  06/27/2006  T:  06/27/2006  Job:  [id number]   cc:   Lucky Cowboy, M.D.

## 2010-10-29 NOTE — Discharge Summary (Signed)
[facility]  Patient:    [name], [name]                   MRN: [id number] Adm. Date:  [id number] Disc. Date: [id number] Attending:  Nadean Corwin CC:         Nadara Mustard, M.D.             Dorothyann Peng, M.D.             Marinus Maw, M.D.                           Discharge Summary  FINAL DIAGNOSES: 1. Abdominal wall surgical wound infection. 2. Non-insulin-dependent diabetes mellitus. 3. Hypertension. 4. Atherosclerotic coronary artery disease. 5. History of asthma.  CONDITION ON DISCHARGE:  Improved.  Prognosis good.  DISCHARGE MEDICATIONS: 1. Keflex 500 mg q.i.d. for two weeks with refill. 2. Ultram 50 mg tablets 1-2 q.4h. p.r.n. pain. 3. Zocor 80 mg q.h.s. 4. Ecotrin 325 mg q.d. 5. Serevent MDI p.r.n. 6. Albuterol MDI p.r.n.  ALLERGIES: 1. PENICILLIN. 2. SULFA. 3. CODEINE. 4. DILAUDID. 5. MORPHINE.  HOSPITAL SUMMARY:  The patient is a very nice 65 year old married white male with HTN, NIDDM, and he has CAD and degenerative disk disease, who underwent an initial lumbar disk surgery in September 2000.  The patient had revision surgery for repair of faulty prosthesis on Oct 28, 1999 at [facility] and was discharged uneventfully home.  The patient initially did well and subsequently developed superficial wound infection and presented to the emergency room.  For fear of "Staph infection" in view of his surgical procedure with "hardware" placement, e was admitted for culturing and intravenous antibiotic coverage with vancomycin.  Following admission, the patient remained stable and afebrile and was given wound care and seen in consultation by Dr. Aldean Baker for recommendations.  Cultures of the patients blood, anterior abdominal wound, and nasal culture remained negative throughout hospitalization and the initial cellulitis surrounding his anterior abdominal wound seemed to gradually improve on a step-wise  daily basis.  As the  patient had remained stable with no deterioration and wound cultures remained negative, he was felt safe for discharge on oral antibiotics and follow up closely as an outpatient.  For details of admission, please see Dr. Maxwell Caul detailed admission note.  ACCESSORY CLINICAL FINDINGS:  Cultures of blood, anterior abdominal wound, and ose all reported negative at the time of discharge.  CMET at admission showed glucose 117, albumin 2.8, and no other abnormalities with elevated sed rate of 52 and C. reactive protein of 5.9 (normal less than 0.8).  Initial CBC showed hemoglobin  11.4 g% and WBC 7200 with hemoglobin dropping to 10.9 g% two days following admission, presumed due to redistribution of body fluids with bedrest.  CONDITION ON DISCHARGE:  Stable and improved.  Prognosis good.  COMPLICATIONS:  None. DD:  11/10/99 TD:  11/15/99 Job: [id number]

## 2010-10-29 NOTE — Op Note (Signed)
NAME:  [name], [name]                      ACCOUNT NO.:  [id number]   MEDICAL RECORD NO.:  [id number]                   PATIENT TYPE:  OIB   LOCATION:  2871                                 FACILITY:  [facility]   PHYSICIAN:  Robert L. Dione Booze, M.D.               DATE OF BIRTH:  1946/01/13   DATE OF PROCEDURE:  10/24/2002  DATE OF DISCHARGE:  10/24/2002                                 OPERATIVE REPORT   PREOPERATIVE DIAGNOSIS:  Severe dermatochalasis with visual impairment.   POSTOPERATIVE DIAGNOSIS:  Severe dermatochalasis with visual impairment.   PROCEDURE:  Upper eyelid blepharoplasties.   SURGEON:  Robert L. Dione Booze, M.D.   ANESTHESIA:  1% Xylocaine with epinephrine.   INDICATIONS AND JUSTIFICATION FOR PROCEDURE:  The patient has been followed  in my office for many years and has had worsening dermatochalasis with  redundant skin of his upper eyelid blocking his visual field.  This causes  him multiple symptoms and he can feel the weight, which causes fatigue.  Documentation of this was sent to the insurance for prior approval showing  photographs and showing the reduction of the superior field when the skin of  the lid was tacked up compared to when it was allowed to droop.  Otherwise,  the pressure was 11 in each eye and the best corrected vision of 20/25 right  eye and 20/20 left.  Field testing shows loss of the superior field from the  skin of the lids.  The pupils, motility, conjunctiva, corner, anterior  chamber, and fundus exams were unremarkable, and at slit lamp he does have  early cataractous lens changes that are not presently clinically  significant.  Externally he has significant redundant skin of each upper  lid.  Sloping margin reflex distance (MRD) is only 1 mm.  The risk and  possible benefit of the blepharoplasty was discussed with him, and he  decided to have upper eyelid blepharoplasties in order to see better.  Medically he should be stable for this.   JUSTIFICATION FOR PERFORMING PROCEDURE IN OUTPATIENT SETTING:  Routine.   JUSTIFICATION FOR OVERNIGHT STAY:  None.   DESCRIPTION OF PROCEDURE:  The patient arrived in the minor surgery room at  [facility] and was prepped and draped in the routine fashion.  Xylocaine 1% with epinephrine was given to each upper eyelid and the skin to  be removed was carefully demarcated and excised.  Bleeding was controlled  with pressure and cautery.  Each wound was then closed with a running 6-0  nylon suture and pressure patches were applied.  The patient then left the  minor room having done nicely.    FOLLOW-UP CARE:  The patient will be seen in my office in approximately six  days to have the sutures removed.  He is to remove the patches in several  hours.  He is to use Polysporin ointment in his eyes at night and is to  use  warm compresses.                                               Robert L. Dione Booze, M.D.    RLG/MEDQ  D:  10/27/2002  T:  10/28/2002  Job:  [id number]   cc:   Lucky Cowboy, M.D.  [address], [address]  [place], [place] [address]  Fax: [phone]   Clarene Critchley, M.D.

## 2010-10-29 NOTE — Procedures (Signed)
[facility]  Patient:    [name], [name]                   MRN: [id number] Proc. Date: 03/30/00 Adm. Date:  [id number] Attending:  Thyra Breed CC:         Dr. Dorita Fray, [facility]  Re[place], M.D.   Procedure Report  PROCEDURE:  Lumbar epidural steroid injection.  DIAGNOSIS:  Lumbar spondylosis with neural foraminal stenosis at L4 on the right side.  INTERVAL HISTORY:  The patient noted that he has ongoing improvement after his first lumbar epidural steroid injection but notes that the improvement from his second injection was not as incrementally great as the first. Nevertheless, he wishes to go ahead and have an injection today.  He has recently had an exacerbation of his allergies and is on Deltasone for this.  PHYSICAL EXAMINATION:  VITAL SIGNS:  Blood pressure 136/73, heart rate 85, respiratory rate 12, O2 saturation 97%, pain level is 2 out of 10, and temperature is 97.8.  MUSCULOSKELETAL/NEUROLOGIC:  His back shows good healing from his previous injection site.  He has well-healed surgical scars on his back.  DESCRIPTION OF PROCEDURE:  After informed consent was obtained, the patient was placed in the sitting position and monitored.  His back was prepped with Betadine x 3.  A skin wheal was raised at the L4-5 interspace with 1% lidocaine.  A 20-gauge Tuohy needle was introduced in the lumbar epidural space and loss of resistance to preservative-free normal saline.  There was no CSF nor blood.  Medrol 80 ml in 10 ml of preservative-free normal saline was gently injected.  The needle was flushed with preservative-free normal saline and removed intact.  POSTPROCEDURE CONDITION:  Stable.  DISCHARGE INSTRUCTIONS: 1. Resume previous diet. 2. Limitation on activities per instruction sheet. 3. Continue on current medications. 4. I advised the patient that he could follow up with me in four to six months   to consider repeat injections at that time. DD:  03/30/00 TD:  03/30/00 Job: [id number]

## 2010-10-29 NOTE — Procedures (Signed)
[facility]  Patient:    [name], [name]                   MRN: [id number] Proc. Date: 03/06/00 Adm. Date:  [id number] Attending:  Thyra Breed CC:         Dorita Fray, M.D., [facility]   Procedure Report  DIAGNOSIS:  Lumbar spondylosis with underlying lumbar radiculopathy.  INTERVAL HISTORY:  The patient was here last week and I gave him a lumbar facet nerve block at 4-5, 5-S1. The patient has noted over the weekend that he has gotten a mild improvement in his level of discomfort but he continues to have pain when he walks about 25 yards. I advised him that I wished to go ahead and proceed today with a lumbar epidural steroid injection. I do not feel it is feasible to perform a transforaminal block at L5-S1 because of the hardware obliteration of the radiographic appearance.  PHYSICAL EXAMINATION:  VITAL SIGNS:  Blood pressure 154/84, heart rate 93, respiratory rate 16, O2 saturations 97% and pain level is 1/10.  The patient has some tenderness of the lumbosacral region but it is very mild. There is a well healed surgical scar. Neurologic exam is unchanged from previously.  DESCRIPTION OF PROCEDURE:  After informed consent was obtained, the patient was placed in the sitting position and monitored. The patients back was prepped with Betadine x 3. A skin wheal was raised at the L3-4 interspace with 1 percent lidocaine. A 20 gauge Tuohy needle was introduced to the lumbar epidural space to loss of resistance to preservative free normal saline. There was no cerebrospinal fluid nor blood. The depth was 6 cm. I injected 80 mg of Medrol and 8 ml of preservative free normal saline. The needle was flushed with 2 ml of preservative free normal saline and removed intact.  CONDITION POST PROCEDURE:  Stable.  DISCHARGE INSTRUCTIONS:  Resume previous diet. Limitations in activities per instruction sheet. Continue on current medications. Follow-up with me in  2 weeks at which time we will decide whether to do more extensive facet joint nerve blocks at L2-3, 3-4, 4-5, and 5-S1 versus another epidural steroid injection. DD:  03/06/00 TD:  03/06/00 Job: [id number]

## 2010-10-29 NOTE — Cardiovascular Report (Signed)
[name], [name] NO.:  [id number]   MEDICAL RECORD NO.:  [id number]          PATIENT TYPE:  OIB   LOCATION:  2807                         FACILITY:  [facility]   PHYSICIAN:  Peter M. Swaziland, M.D.  DATE OF BIRTH:  02/12/1946   DATE OF PROCEDURE:  06/26/2006  DATE OF DISCHARGE:                            CARDIAC CATHETERIZATION   INDICATIONS FOR PROCEDURE:  The patient is 65 year old white male with  known history of coronary disease.  He is status post remote stenting of  the LAD and of the proximal to mid right coronary artery.  He presents  now with typical anginal symptoms.  Cardiac catheterization demonstrates  occlusion of the left circumflex coronary artery which is well  collateralized from the right coronary artery.  He has an 80-90%  stenosis in the mid posterior descending artery.  It was recommended  that we intervene on the PDA lesion and treat the circumflex disease  medically.   ACCESS:  Via the right femoral artery as performed by Dr. Donnie Aho.   EQUIPMENT:  A 6-French FR-4 guide 0.014 high-torque floppy wire, a 2.75  x 12 mm Liberte stent, a 3 x 8 mm PowerSail balloon and a 3 x 12 mm  Quantum Maverick balloon.   ADDITIONAL MEDICATIONS:  1. Nitroglycerin 200 mcg intracoronary x2.  2. Angiomax bolus at 0.75 mg/kg followed by continuous infusion of      1.75 mg/kg per hour.  ACT was 369.   PROCEDURE NOTE:  The right coronary artery was engaged with the guide  catheter.  The lesion was easily crossed with the wire.  We primarily  stented this lesion using the 2.75 x 12 mm Liberte stent.  This was  deployed at 9 atmospheres and then 14 atmospheres with a stent balloon.  There was still significant wasting of the stent.  We then used the  PowerSail balloon post dilating this to 18 and then 20 atmospheres.  There still appeared to be some wasting in the middle of the stent.  We  next exchanged for a Quantum Maverick balloon 3 x 12 mm and dilated to  16 atmospheres.  This yielded excellent expansion of the balloon and an  excellent angiographic result with 0% residual stenosis and TIMI grade 3  flow.  There was no loss of the side branch that arose from the lesion.  The patient tolerated the procedure well without complications.   IMPRESSION:  Successful intracoronary stenting of the mid posterior  descending artery.           ______________________________  Peter M. Swaziland, M.D.     PMJ/MEDQ  D:  06/26/2006  T:  06/26/2006  Job:  [id number]   cc:   Lucky Cowboy, M.D.

## 2010-10-29 NOTE — Procedures (Signed)
[facility]  Patient:    [name], [name]                   MRN: [id number] Proc. Date: 02/29/00 Adm. Date:  [id number] Attending:  Thyra Breed CC:         Dorita Fray, M.D. Dept. of Ortho. Surgery  [place], [place], [place] [address]   Procedure Report  PROCEDURE:  Paravertebral facet joint nerve block at L5-S1 and L4-5 on the right side.  DIAGNOSIS:  Lumbar spondylosis status post fusion.  HISTORY OF PRESENT ILLNESS:  [name] is a 65 year old who was originally seen by [name] back in  the spring of 2000 at which time he received a series of epidural steroid injections. He had not improved and was sent to [facility] where he was evaluated by Dr. Elvina Sidle and underwent surgical intervention. He underwent decompressive laminectomies and facetectomies. He did quite well up until February of 2001 at which time he was having increasing symptoms down to his right lower extremity and was found to have a fractured screw. He underwent surgery for this with replacement of the screw and a Ray cage placement.  The patient has subsequently been seen by Dr. Dorita Fray and a CT myelogram was obtained this Friday which was interpreted by Dr. Wayland Salinas as showing no foraminal stenosis at L2-3 bulging at 3-4 and asymmetry of the right L4 neuroforamina with postoperative changes at 4-5 and 5-S1. Dr. Elvina Sidle requested that he get a transforaminal epidural steroid injection and L5-S1 facet injections bilaterally.  The patient currently complains mostly of pain radiating out into his right lower extremity predominantly over the lateral aspect of the thigh which occurs about 2-3 minutes after he stands up. It will crescendo up to a very high level and he has not been able to be as active as he once was. He denied any new weakness or bile or bladder incontinence.  He is currently on no medications.  PHYSICAL EXAMINATION:  VITAL SIGNS:  Blood pressure 140/70, heart rate is 100, respiratory  rate 16, O2 saturations 95%, and temperature is 98.4. Pain level is 3-4/10.  He exhibits tenderness over the right lower lumbar facet joints which is increased by hyperextension and reduced by forward flexion. Straight leg raise signs are negative. Deep tendon reflexes show attenuated right knee jerk relative to the left with ankle jerks 1+ and symmetric. Motor was 5/5 with symmetric bulk and tone.  DESCRIPTION OF PROCEDURE:  After informed consent was obtained, the patient was taken to the fluoroscopy suite where he was placed in the prone position and monitored. Using fluoroscopic guidance, I identified lumbar vertebra L1 through L5. His screws obscured visualization to a degree but I could identify the junction of the superior articulating process with the transverse process at the L5 vertebra and at the sacral cornu. The areas were marked over the skin and prepped with Betadine x 3. I draped out the back. I anesthetized the skin and subcutaneous structures with a 25 gauge needle using 2 cc of 1% lidocaine at each level. Twenty-five gauge spinal needles were introduced down to the transverse process at L5 and to the sacral cornu and retracted back. Lateral projection was of no help because of the screws which obliterated visualization. Aspiration was negative. I injected a 0.5 cc of 1% lidocaine at each level, 30 seconds later there was no evidence of spinal nor nerve root injection. I injected 20 mg of Medrol with a cc of 1% lidocaine at each  level and the needles were flushed with 1% lidocaine and removed intact.  CONDITION POST PROCEDURE:  The patient was observed for about a half hour. Initially, he noted marked improvement in his pain but over the course of a half hour his pain recurred and with standing he continued to have numbness out into his lateral thigh.  Based on the results, it does not appear that his face joints are the cause of his discomfort especially on the right  side.  I advised the patient, we could try a midline epidural steroid injection but a transforaminal injection would be very difficult because of the positioning of the screws and probably would be not of much benefit in the patient. I indicated that he return next week and would give him a series of epidurals and see if he does improve. DD:  02/29/00 TD:  03/02/00 Job: [id number]

## 2010-10-29 NOTE — Discharge Summary (Signed)
[facility]  Patient:    [name], [name]                   MRN: [id number] Adm. Date:  [id number] Disc. Date: [id number] Attending:  Charlton Haws CC:         Marinus Maw, M.D.   Discharge Summary  ACCOUNT NUMBER:  [id number]  FINAL DIAGNOSES: 1. Hypokalemia. 2. Ileus. 3. Possible partial small-bowel obstruction. 4. Status post percutaneous transluminal coronary angioplasty. 5. Status post hiatal hernia repair and possibly _______. 6. Status post anterior fusion, Ray cage placement with wound infection. 7. Insulin-dependent diabetes. 8. Hypertension.  CLINICAL HISTORY:  [name] presented to the emergency room with a 12 to 18-hour history of upper abdominal pain, burning sensation, midabdominal pain, nausea, and dry heaves.  He was unable to vomit presumably secondary to a prior antireflux procedure.  No diarrhea, no bowel movements for 36 ours.  Was feeling better at the time of admission through the emergency room.  He had had problems with intermittent constipation and "blockage" which resolved taking enemas and laxatives.  In the emergency room his abdominal x-rays showed some gastric distension and partial small-bowel obstruction.  White cell count was elevated at 12,000.  Potassium was low at 2.9.  He w3as admitted for further management.  PAST MEDICAL HISTORY:  Noted in the Admission Note and not redictated here.  HOSPITAL COURSE:  The patient was admitted, begun on IV fluids, potassium replacement, and NG suction.  The next morning his x-rays looked about the same, but he felt much better and actually began passing large amounts of gas right after his x-rays were done.  His abdomen was much softer, and his potassium had improved, and he had bowel sounds and was nontender.  The next day he was feeling better, and his NG was plugged and on the third day removed, and he was begun on a full liquid diet which he tolerated  nicely.  On October 4, he was feeling well.  He had been having bowel movements.  He was having no abdominal pain and no nausea.  His abdomen was soft, he was afebrile, and it was felt he could be discharged.  FINAL DIAGNOSES:  Concluded that he had some mild hypokalemia and probably a mild ileus secondary to that.  May have also had a partial small-bowel obstruction which resolved with NG suction.  DISCHARGE MEDICATIONS:  The patient is to resume his usual home medications.  DISCHARGE FOLLOWUP:  Follow up with [name] on a p.r.n. basis and follow up with Dr. Oneta Rack for his routine medical problems. DD:  03/16/00 TD:  03/16/00 Job: [id number]

## 2010-10-29 NOTE — Cardiovascular Report (Signed)
NAMEJADAVION, [name] NO.:  [id number]   MEDICAL RECORD NO.:  [id number]          PATIENT TYPE:  OIB   LOCATION:  2899                         FACILITY:  [facility]   PHYSICIAN:  Georga Hacking, M.D.DATE OF BIRTH:  [birth year]   DATE OF PROCEDURE:  06/26/2006  DATE OF DISCHARGE:                            CARDIAC CATHETERIZATION   This 65 year old male has a history of coronary artery disease with  previous stenting of the LAD in 1999 and angioplasty the right coronary  artery.  He later had total occlusion of the right coronary artery and  wound up having stenting of the chronic total occlusion right coronary  artery done at [facility].  He has done well since then. He has been fine  since then but has had a history of GI bleeding requiring surgery.  He  is also had back surgery.  He did well until about 2 weeks prior to  admission to develop progressive angina.  The longest episode lasted  most of the evening.  He was seen at the office and had a normal EKG but  in view of progressive symptoms and known coronary disease he is brought  directly to catheterization.   PROCEDURE:  Left heart catheterization with coronary angiograms and left  ventriculogram.   COMMENTS ABOUT PROCEDURE:  The patient tolerated the procedure well  without complications.  The right femoral artery was entered using a  single anterior needle wall stick and angiograms were made using 6-  French catheters.  A biplane ventriculogram was done because the total  occlusion of the circumflex.  Dr. Peter Swaziland then scrubbed into the  case to perform the interventional portion of the procedure.   HEMODYNAMIC DATA:  Aorta postcontrast 130/73, LV postcontrast 130/11-16.   ANGIOGRAPHIC DATA:  Left ventriculogram:  Performed in the 30 degrees  RAO projection.  The aortic valve is normal.  The mitral valve is  normal.  The left ventricle appears normal in size. The wall motion is  normal in all segments.  An estimated ejection fraction of 60%. Coronary  arteries arise and distribute normally.  There is calcification noted  involving the right coronary artery.  There is mild calcification of the  LAD.  The left main coronary artery is normal.  The left anterior  descending stented site is widely patent with 0% residual stenosis.  The  LAD terminates in the distal anterior wall.  The diagonal branch has  moderate irregularities. The circumflex coronary artery is completely  occluded.  There is a side branch that comes off the site of the  occlusion.  There is no antegrade flow noted and a well-developed system  collaterals was seen coming off continuation branch of the right  coronary artery.  The right coronary artery is calcified at its  midportion.  The stented site is widely patent and appears to have mild  diffuse narrowing but is widely patent.  The right coronary gives off a  continuation branch which has a well-developed system of collaterals  going to the circumflex.  There is then a large posterior descending  artery that extends to the apex.  There is a mid vessel 85% stenosis  with a side branch coming off at the site of the stenosis.   IMPRESSION:  1. Progression of coronary artery disease with total occlusion of      circumflex with well-developed collaterals as well as a new lesion      in the midportion of the posterior descending artery with a side      branch coming off the site of the lesion.  2. Long-term patency of the stented sites to the LAD in the mid right      coronary artery.  3. Normal LV function.   RECOMMENDATIONS:  Discussed with Dr. Swaziland.  At this point he will  scrub into the case and we will do percutaneous intervention of the  circumflex of the right coronary artery with a non drug-eluting stent  because of  previous history GI bleeding requiring surgery.  He is also had two  previous non drug-eluting stents that have had good long-term results.  If  he continues to have symptoms despite medical therapy we can consider  an attempt at recanalization of the chronically occluded circumflex.      Georga Hacking, M.D.  Electronically Signed     WST/MEDQ  D:  06/26/2006  T:  06/26/2006  Job:  [id number]   cc:   Lucky Cowboy, M.D.  Peter M. Swaziland, M.D.

## 2010-10-29 NOTE — Consult Note (Signed)
[facility]  Patient:    [name], [name]                   MRN: [id number] Proc. Date: 11/08/99 Adm. Date:  [id number] Attending:  Nadean Corwin                          Consultation Report  HISTORY OF PRESENT ILLNESS:  The patient is a 65 year old gentleman status post  revision instrumentation of lumbar spine with both a posterior and anterior approach with posterior iliac crest bone graft.  The index procedure was performed at [facility] in September 2000.  The revision surgery was performed at [facility] on Oct 28, 1999.  The patient reports a 1-2 day history of increasing redness and drainage over the inferior aspect of the incision.  PAST MEDICAL HISTORY:  Significant for disk herniation.  Hiatal hernia. Coronary artery disease status post angioplasty and stent placement.  COPD. Hypercholesterolemia.  Type 2 diabetes.  PAST SURGICAL HISTORY:  Back surgery as mentioned above.  Surgery for his peptic ulcer disease.  Stent placement for three vessels.  MEDICATIONS:  Ventolin.  Serevent.  Aspirin.  Zocor.  Dilaudid.  Benadryl.  ALLERGIES:  PENICILLIN causes rash.  SULFA and CODEINE cause rash.  FAMILY HISTORY:  Positive for hypertension and coronary artery disease.  REVIEW OF SYSTEMS:  Negative for MI.  Negative for chest pain.  Negative for diarrhea.  Positive for oral lesions since starting the antibiotics at [facility]. The patient states that he was treated with four days of IV vancomycin postoperatively at [facility].  PHYSICAL EXAMINATION:  HEENT:  The patient does have some oral lesions in his mouth consistent with thrush.  EXTREMITIES:  Examination of both lower extremities show his feet are neurovascularly intact with good plantar flexion, dorsiflexion, and EHL strength. Sensation is intact.  ABDOMEN:  He does have a para midline incision on the left side with redness and cellulitis around the inferior aspect of the wound.  There is no  evidence of any wound dehiscence.  Examination of his lumbar incision shows it to be clean and ry with staples in place.  Examination of the posterior iliac crest bone graft site is also intact with the staples intact with no wound dehiscence.  There is a previous posterior iliac crest bone graft site which is approximately 2 cm running parallel and lateral to the current incision.  The patient has no systemic symptoms, no history of fever or chills.  He has no rebound tenderness and no tenderness to palpation.  He states that the distention that he has has been there since surgery, and is actually improved.  ASSESSMENT:  Significant cellulitis, inferior aspect of the anterior incision.  PLAN:  Agree with vancomycin until wound cultures and sensitivities are available. At that time, I anticipate that the patient could be switched to cephalosporin. I agree with C reactive protein.  Will follow these.  No indications for x-ray need at this time.  This does not appear to be a deep infection.  No evidence of any  peritoneal inflammation.  If the patient should develop any abdominal symptoms, the patient would need a general surgery consultation for possible exploratory laparotomy.  Will start the patient on Mycostatin swish and swallow for his oral thrush and will harvest the posterior staples. DD:  11/08/99 TD:  11/08/99 Job: [id number]

## 2010-10-29 NOTE — H&P (Signed)
[place]. [facility]  Patient:    [name], [name]                     MRN: [id number] Adm. Date:  09/08/00 Attending:  Roosvelt Harps, M.D. CC:         Llana Aliment. Randa Evens, M.D.             Darden Palmer., M.D.             Marinus Maw, M.D.                         History and Physical  HISTORY OF PRESENT ILLNESS:  The patient is a 65 year old male who called me and was directed to the emergency room after feeling weak and having multiple episodes of black stool last night and this morning.  He has a vague burning in the epigastrium as well.  He underwent back surgery in December for spondylolisthesis, and has been taking Naprosyn as much as every three to four hours since then.  However, he discontinued it two or three days ago, when he began feeling well.  In addition, he has been taking two baby aspirins a day.  One year ago, he was admitted to the hospital for gastrointestinal bleeding, and at that time, an upper endoscopy revealed duodenitis, and a colonoscopy revealed hemorrhoids.  He tells me that he was not advised while he was on Naprosyn and aspirin together to be on any proton pump inhibitor.  He is feeling very weak and having trouble walking, but he has had no syncope.  PAST MEDICAL HISTORY:  Pertinent for the above.  In addition, he is status post coronary angiogram, and stent placement, and a Nissen fundoplication, and hiatal hernia repair.  FAMILY HISTORY:  Noncontributory.  There is no known ulcers, gallstones, inflammatory bowel disease, or gastrointestinal neoplasia.  SOCIAL HISTORY:  He is a married Veterinary surgeon.  He does not smoke or drink at present.  REVIEW OF SYSTEMS:  GENERAL:  No weight loss or night sweats.  ENDOCRINE:  No history of diabetes or thyroid problems.  SKIN:  No rash or pruritus.  EYES: No icterus or change in vision.  ENT:  No aphthous, ulcers, or chronic sore throat.  RESPIRATORY:  No shortness of  breath, cough, or wheezing.  CARDIAC: No recent chest pain, palpitations, or history of valvular heart disease, although today, he has felt his heart beating somewhat rapidly.  GI:  As above.  GU:  No dysuria or hematuria.  PHYSICAL EXAMINATION:  GENERAL:  He is a well-developed, obese, adult male in no acute distress.  VITAL SIGNS:  Afebrile with a blood pressure of 120/80, and a pulse of 90 and regular.  SKIN:  Normal.  HEENT:  Eyes are anicteric.  Oropharynx is unremarkable.  NECK:  Supple without thyromegaly.  There is no cervical or inguinal adenopathy.  CHEST:  Clear.  HEART:  Sounds regular rate and rhythm.  ABDOMEN:  Soft with minimal epigastric tenderness to deep palpation.  There is no rebound or hernia.  Bowel sounds are normal.  RECTAL EXAMINATION:  Reveals no internal or external lesions.  Of note, the stool is melanotic and strongly guaiac positive.  EXTREMITIES:  Without cyanosis, clubbing, edema, or rash.  LABORATORY DATA:  Hemoglobin is 11.7, but it is noted that in December it was 14.  IMPRESSION:  A 65 year old male with probable acute upper gastrointestinal bleed.  I  suspect he has nonsteroidal-induced gastritis or ulcers.  PLAN:  He will be admitted for hydration, observation, and transfusion if necessary based on serial hemoglobins.  He will receive intravenous Protonix. Urgent endoscopy will be performed.  Please see the orders. DD:  09/08/00 TD:  09/09/00 Job: [id number]

## 2010-10-29 NOTE — Discharge Summary (Signed)
[place]. [facility]  Patient:    [name], [name]                   MRN: [id number] Adm. Date:  [id number] Disc. Date: [id number] Attending:  Orland Mustard CC:         Darden Palmer., M.D.             Marinus Maw, M.D.                           Discharge Summary  REASON FOR ADMISSION:  Acute gastrointestinal bleed.  FINAL DIAGNOSES: 1. Gastrointestinal bleed, possibly due to hemorrhoids versus duodenitis or    duodenal ulcer. 2. Duodenitis or possible small duodenal ulcer.  PROCEDURES:  Endoscopy and colonoscopy.  PERTINENT ADMISSION HISTORY:  A nice 66 year old gentleman who felt extremely weak for three days, was weak and dizzy, was having difficulty walking and feeling dizzy when he stood up.  He had had dark stools for two days, was short of breath with exertion, had not seen any bright red blood per rectum. Had no abdominal pain, heartburn, or dyspepsia.  He presented to Dr. Tawana Scale office with a concern that this might be his heart causing the problem.  At Dr. Tawana Scale office he was noted to have a hemoglobin of 9.8, which was down considerably from a previous number Dr. Donnie Aho had of 14.  Stools were grossly positive.  He was orthostatic with systolic blood pressure dropping from 100 to 60 with standing and nearly passed out.  Given these circumstances, he was admitted to the hospital with a diagnosis of GI bleed.  MEDICATIONS ON ADMISSION:  Zocor, Vanceril inhaler, Serevent, vitamins, and aspirin one daily.  MEDICAL HISTORY:  1. History of peptic ulcer disease many years ago. 2. Chronic gastroesophageal reflux disease, status post Nissen fundoplication 20 years ago.  3. Coronary artery disease status post PTCA x 4 with a stent with a recent negative Cardiolite study.  4. COPD.  PERTINENT PHYSICAL EXAMINATION:  Patient was examined in the emergency room. At that time, he had already received a large amount of IV  fluids.  VITAL SIGNS:  Temperature 98, blood pressure 116/80, pulse 120.  GENERAL:  He was a pleasant white male who was nonicteric.  HEART AND LUNG:  Normal.  ABDOMEN:  Soft and nontender with a surgical scar in the midline. Stool had been positive by Dr. Donnie Aho and was not rechecked.  HOSPITAL COURSE:  Patient was admitted to a medical floor.  Given the fact that he was actively bleeding and orthostatic, he was transfused two units in the emergency room by the emergency room staff.  At the time that I arrived there, he was already on the second unit.  Hemoglobin was repeated initially in the emergency room and was 9.5.  Patient did receive one unit of packed cells, as mentioned.  Subsequent hemoglobin had dropped to 9.3 and his acute bleeding stopped, and his vital signs stabilized.  Stools remained grossly positive for blood.  He continued to receive IV fluid therapy.  Endoscopy was performed that showed small areas of inflammation and duodenitis versus small ulcers in the duodenal bulb without active bleeding.  This was performed the day of his admission.  Hospital day #2, he was feeling better, was not dizzy. His pulse was down to 92, blood pressure remained 93/59.  He had good urine output.  That morning his hemoglobin was  still 9.3, it had dropped from admission despite one unit of blood.  It was felt he probably had a lower GI bleed, or certainly this ought to be considered.  The following day, on March 15, patient underwent a colonoscopy to the terminal ileum which was normal, other than some small hemorrhoids.  At that point in time, his hemoglobin had come up to 9.7 and he was having no further bleeding.  The following morning, his hemoglobin had dropped to 9.3 but he had no clinical bleeding, and I felt this was probably within the lab parameters and did not indicate active bleeding.  It was felt that he was in satisfactory condition for discharge. While in the hospital,  he had been given Pepcid, initially, p.o. and his diet had been progressively advanced following the colonoscopy and was tolerating a regular diet without any difficulty.  His vital signs were stable.  DISPOSITION:  Patient is discharged home.  MEDICATIONS AT THE TIME OF DISCHARGE: 1. Prilosec 20 mg daily. 2. Flovent inhaler 2 puffs b.i.d. 3. Serevent 2 puffs b.i.d. 4. Zocor 80 mg daily. 5. Claritin daily.  INSTRUCTIONS:  He was instructed to call back for any signs of recurrent bleeding.  FOLLOW-UP:  He will see Dr. Randa Evens in the office this coming Monday for a CBC and hemoccult and have a regular office visit in three weeks. DD:  09/15/99 TD:  09/17/99 Job: [id number]

## 2010-10-29 NOTE — Procedures (Signed)
[facility]  Patient:    OCTAVIA, MOTTOLA                   MRN: [id number] Proc. Date: 03/20/00 Adm. Date:  [id number] Attending:  Thyra Breed CC:         Dorita Fray, M.D., [facility]   Procedure Report  PROCEDURE:  Lumbar epidural steroid injection.  DIAGNOSIS:  Lumbar spondylosis with some asymmetric right L4 neuroforaminal changes.  INTERVAL HISTORY:  The patients noted overall improvement after his injection at his last visit. He did develop some problems with his colon and was hospitalized for 4 days which was felt to be unrelated to the injection. He presents today for his second lumbar epidural steroid injection. The patient is able to walk a greater distance without discomfort. He does have spasms in his back periodically.  PHYSICAL EXAMINATION:  VITAL SIGNS:  Blood pressure 144/78, heart rate 94, respiratory rate 14, O2 saturations 97%, pain level is 1-2/10 and temperature is 97.9.  He shows good healing from his previous injection site. He has well healed surgical scars on his back.  DESCRIPTION OF PROCEDURE:  After informed consent was obtained, the patient was placed in the sitting position and monitored. The patients back was prepped with Betadine x 3. A skin wheal was raised at the L4-5 interspace with 1 percent lidocaine. A 20 gauge Tuohy needle was introduced to the lumbar epidural space to loss of resistance to preservative free normal saline. There was no cerebrospinal fluid nor blood. 80 mg of Medrol and 10 ml of preservative free normal saline was gently injected. The needle was flushed with preservative free normal saline and removed intact.  CONDITION POST PROCEDURE:  Stable.  DISCHARGE INSTRUCTIONS:  Resume previous diet. Limitations in activities per instruction sheet as outlined by my assistant today. Continue on current medications with the exception of stopping the Flexeril which was felt to be exacerbating his  colonic problems and he is started on baclofen 10 mg 1/2 tablet q. 8h #50 with no refill. I advised him the potential risk of this medication in great detail. His questions were answered with regard to this. I plan to see him back in follow-up in 1-2 weeks for his third injection into the epidural space. DD:  03/20/00 TD:  03/20/00 Job: [id number]

## 2010-10-29 NOTE — Discharge Summary (Signed)
[place]. [facility]  Patient:    [name], [name]                     MRN: [id number] Adm. Date:  09/08/00 Disc. Date: 09/12/00 Attending:  Roosvelt Harps, M.D. CC:         Carman Ching, M.D.  Darden Palmer., M.D.  Marinus Maw, M.D.   Discharge Summary  DISCHARGE DIAGNOSES: 1. Upper gastrointestinal bleeding. 2. Paraesophageal hernia despite prior Nissen fundoplication. 3. Gastric ulcer at the entrance to the hiatal hernia likely related to    nonsteroidals. 4. Post hemorrhagic anemia. 5. History of coronary artery disease status post stent placement.  HISTORY OF PRESENT ILLNESS:  Patient is a 65 year old male whom I directed to the emergency room on March 29th after he called me complaining of feeling weak and having multiple episodes of black stools with vague epigastric burning.  He had undergone back surgery in December for spondylolisthesis and had been taking Naprosyn every three to four hours for pain, but he recently discontinued this.  In addition, he was taking two baby aspirins per day.  He was admitted to the hospital a year ago for gastrointestinal bleeding.  At that time upper endoscopy revealed duodenitis and colonoscopy revealed hemorrhoids.  He had also been on Naprosyn prior to that admission.  He had not been taking any acid suppression therapy.  He is status post a Nissen fundoplication 23 years ago.  He denies dysphagia or weight loss.  He has not vomited any blood.  Please see the admission history and physical for remaining information.  PHYSICAL EXAMINATION:  On physical exam on admission he is afebrile with a blood pressure of 120/80 and a pulse of 90 without orthostatic changes. Pertinent exam reveals a soft abdomen with minimal epigastric tenderness to deep palpation without rebound or hernia.  Rectal exam reveals melena.  LABORATORY DATA:  Hemoglobin on admission was 11.7.  HOSPITAL COURSE:  Patient  underwent urgent endoscopy that did demonstrate some coffee grounds in the stomach.  He was transfused over the course of the hospitalization a total of three units with no appropriate rise in his hemoglobin and continued melena.  For this reason two days after admission he was prepped for colonoscopy and repeat endoscopy as well.  The colonoscopy revealed a few diminutive polyps; the pathology report of which is still pending.  The upper endoscopy at this time, however, was more adequate because there was no blood in the stomach and surprisingly a paraesophageal hiatal hernia was found behind the Nissen fundoplication wrap and at the entrance this hiatal hernia was a bleeding gastric ulcer.  This was injected with epinephrine and following this the patient had no significant further bleeding.  His discharge hemoglobin was 9.9.  DISCHARGE MEDICATIONS:  Protonix 40 mg q.d.  He also takes Flovent and Serevent, and Zocor 80 mg per day.  He is advised not to use his aspirin and to avoid Naprosyn, and all other nonsteroidals for the present.  CONDITION ON DISCHARGE:  Improved with stable hemoglobin and no further melena.  FOLLOW-UP:  Will be to Dr. Randa Evens in the office within seven to 10 days.  He will also see Dr. Magnus Ivan who was consulted regarding his hiatal hernia, which was confirmed on upper GI series.  A decision regarding surgical correction of the paraesophageal hiatal hernia will be made within the next week.  He is to contact me if he has any recurrent gastrointestinal bleeding.  DD:  09/12/00 TD:  09/12/00 Job: [id number]

## 2010-10-29 NOTE — Procedures (Signed)
[place]. [facility]  Patient:    [name], [name]                   MRN: [id number] Proc. Date: 08/26/99 Adm. Date:  [id number] Disc. Date: [id number] Attending:  Orland Mustard CC:         Darden Palmer., M.D.             Marinus Maw, M.D.                           Procedure Report  PROCEDURE:  Colonoscopy.  MEDICATIONS:  Fentanyl 60 mcg and Versed 6 mg IV.  SCOPE:  Olympus video colonoscope.  INDICATIONS FOR PROCEDURE:  Acute GI bleed with drop in hemoglobin and heme positive stools.  The patient had an upper endoscopy performed that revealed some mild duodenitis.  He has had a previous antireflux operation.  He had several duodenal erosions.  Otherwise, the upper GI tract looked okay.  Given the degree of drop in his hemoglobin, it was felt that colonoscopy was indicated.  His stools had been positive in the hospital.  DESCRIPTION OF PROCEDURE:  The procedure had been explained to the patient and consent obtained.  With the patient in the left lateral decubitus position, the  Olympus video adult colonoscope was inserted and advanced under direct vision. The prep was excellent.  We were able to advance to the cecum without difficulty. he ileocecal valve was seen and entered for just a short distance.  I could no t advance into the terminal ileum.  As soon as I tried to advance, the scope came  out.  I was able to see approximately 3-4 cm into the terminal ileum.  It was normal with no evidence of coffee ground material or bleeding.  The scope was withdrawn.  The cecum, ascending colon, hepatic flexure, transverse colon, splenic flexure, descending and sigmoid colon were seen well with no diverticular disease, no polyps, tumors or any other lesions.  Small internal hemorrhoids were seen in the rectum upon removal of the scope.  The scope was withdrawn.  The patient tolerated the procedure well.  ASSESSMENT:  Small  internal hemorrhoids.  Otherwise negative exam.  The cause of his acute gastrointestinal bleeding is unclear but, due to the erosions seen in his duodenum, we must assume at this point that this was the source of his bleeding. PLAN:  Will feed the patient and observe clinically.  Discharge in the morning f stable. DD:  08/26/99 TD:  08/26/99 Job: [id number]

## 2010-11-04 ENCOUNTER — Encounter (HOSPITAL_BASED_OUTPATIENT_CLINIC_OR_DEPARTMENT_OTHER)
Admission: RE | Admit: 2010-11-04 | Discharge: 2010-11-04 | Disposition: A | Discharge status: Home / Self Care | Payer: Medicare Other | Source: Ambulatory Visit | Attending: Otolaryngology | Admitting: Otolaryngology

## 2010-11-04 LAB — BASIC METABOLIC PANEL
BUN: 15 mg/dL (ref 6–23)
CO2: 27 mEq/L (ref 19–32)
Chloride: 101 mEq/L (ref 96–112)
Creatinine, Ser: 0.82 mg/dL (ref 0.4–1.5)

## 2010-11-09 ENCOUNTER — Ambulatory Visit (HOSPITAL_BASED_OUTPATIENT_CLINIC_OR_DEPARTMENT_OTHER)
Admission: RE | Admit: 2010-11-09 | Discharge: 2010-11-09 | Disposition: A | Discharge status: Home / Self Care | Payer: Medicare Other | Source: Ambulatory Visit | Attending: Otolaryngology | Admitting: Otolaryngology

## 2010-11-09 ENCOUNTER — Other Ambulatory Visit (INDEPENDENT_AMBULATORY_CARE_PROVIDER_SITE_OTHER): Payer: Self-pay | Admitting: Otolaryngology

## 2010-11-09 DIAGNOSIS — Z01812 Encounter for preprocedural laboratory examination: Secondary | ICD-10-CM | POA: Insufficient documentation

## 2010-11-09 DIAGNOSIS — Z01810 Encounter for preprocedural cardiovascular examination: Secondary | ICD-10-CM | POA: Insufficient documentation

## 2010-11-09 DIAGNOSIS — J343 Hypertrophy of nasal turbinates: Secondary | ICD-10-CM | POA: Insufficient documentation

## 2010-11-09 DIAGNOSIS — J323 Chronic sphenoidal sinusitis: Secondary | ICD-10-CM | POA: Insufficient documentation

## 2010-11-09 DIAGNOSIS — J342 Deviated nasal septum: Secondary | ICD-10-CM | POA: Insufficient documentation

## 2010-11-24 NOTE — Op Note (Signed)
[name], OKRAY NO.:  [id number]  MEDICAL RECORD NO.:  [id number]            PATIENT TYPE:  LOCATION:                                 FACILITY:  PHYSICIAN:  Newman Pies, MD                 DATE OF BIRTH:  DATE OF PROCEDURE:  11/09/2010 DATE OF DISCHARGE:                              OPERATIVE REPORT   SURGEON:  Newman Pies, MD  PREOPERATIVE DIAGNOSES: 1. Chronic left sphenoid sinusitis. 2. Nasal septal deviation. 3. Bilateral inferior turbinate hypertrophy.  POSTOPERATIVE DIAGNOSES: 1. Chronic left sphenoid sinusitis. 2. Nasal septal deviation. 3. Bilateral inferior turbinate hypertrophy.  PROCEDURE PERFORMED: 1. Septoplasty. 2. Left sphenoid sinusotomy with tissue removal. 3. Bilateral partial inferior turbinate resection.  ANESTHESIA:  General endotracheal tube anesthesia.  COMPLICATIONS:  None.  ESTIMATED BLOOD LOSS:  50 mL.  INDICATIONS FOR PROCEDURE:  The patient is a 65 year old male with a history of chronic sinusitis for many years.  The patient also complains of chronic nasal obstruction and recurrent headache.  The patient was previously treated with multiple antibiotics, nasal saline irrigation, steroid nasal spray, Singulair, antihistamine, and Afrin.  However, he continues to be symptomatic.  On CT scan, the patient was noted to have significant opacification of the left sphenoid sinus, as well as severe nasal septal deviation and bilateral inferior turbinate hypertrophy. Based on the above findings, the decision was made for the patient to undergo the above-stated procedures.  The risks, benefits, alternatives, and details of the procedures were discussed with the patient. Questions were invited and answered.  Informed consent was obtained.  DESCRIPTION OF PROCEDURE:  The patient was taken to the operating room and placed supine on the operating table.  General endotracheal tube anesthesia was administered by the anesthesiologist.   Preop IV Decadron was given.  The patient was positioned and prepped and draped in the standard fashion for nasal surgery.  Pledgets soaked with Afrin were placed in both nasal cavities.  The pledgets were subsequently removed. Inspection of the nasal cavities revealed severe nasal septal deviation to the right, with a large right septal spur.  Both inferior turbinates were hypertrophied.  Endoscopic evaluation of the nasal cavities also revealed purulent drainage from the left sphenoid sinus.  Photo documentation was obtained.  1% lidocaine with 1:100,000 epinephrine was injected onto the nasal septum bilaterally.  Standard hemitransfixion incision was made on the left side.  The mucosal flap was elevated on the left side in a standard fashion.  It should be noted that the patient has a large area of missing cartilage within the mid segment of the cartilaginous nasal septum.  These was likely secondary to previous surgery.  Cartilaginous incision was made approximately 1 cm superior to the caudal margin of the septum.  The mucosal flap was elevated on the contralateral side in a standard fashion.  The deviated portion of the remaining cartilage and bony nasal septum were removed.  The septum was noted to be at midline at the end of the procedure.  The septum was quilted with 4-0 plain gut sutures.  The  hemitransfixion incision was closed with interrupted 4-0 chromic sutures.  Attention was then focused on the inferior turbinates.  The inferior one half of each inferior turbinate was clamped with straight Kelly clamp.  The inferior one half of each inferior turbinate was then resected with a pair of cross cutting scissors.  Hemostasis was achieved with suction electrocautery.  Attention was then focused on the left sphenoid sinus.  The opening of the cyst left sphenoid sinus was carefully enlarged with a small suction catheter.  The opening was subsequently enlarged with a combination of  a mushroom punch and microdebrider.  At this time, the left sphenoid sinus was noted to filled with clay-like substance.  The appearance was suggestive of allergic fungal mucin.  The clay-like substance was completely removed.  The left sphenoid sinus was copiously irrigated. That concluded procedure for the patient.  A pair of Doyle splints were applied to the nasal septum bilaterally. It was secured in place with 2-0 Prolene sutures.  The care of the patient was turned over to the anesthesiologist.  The patient was awakened from anesthesia without difficulty.  He was extubated and transferred to the recovery room in good condition.  OPERATIVE FINDINGS: 1. Left chronic sphenoid sinusitis, with clay-like substance occupying     the left sphenoid sinus.  The appearance was suggestive of allergic     fungal sinusitis. 2. Nasal septal deviation. 3. Bilateral inferior turbinate hypertrophy.  SPECIMEN:  Left sphenoid sinus content.  FOLLOWUP CARE:  The patient will be discharged home once he is awake and alert.  He will be placed on clindamycin 300 mg p.o. t.i.d. for 5 days, and Vicodin 1-2 tablets p.o. q.4-6 h. p.r.n. pain.  He will follow up in my office in approximately 1 week.     Newman Pies, MD     ST/MEDQ  D:  11/09/2010  T:  11/09/2010  Job:  [id number]  cc:   [name], M.D. [facility]  Electronically Signed by Newman Pies MD on 11/24/2010 10:46:16 AM

## 2010-12-08 LAB — FUNGUS CULTURE W SMEAR

## 2011-01-03 ENCOUNTER — Telehealth: Payer: Self-pay | Admitting: Cardiology

## 2011-01-03 NOTE — Telephone Encounter (Signed)
Per pt call, pt experiencing soreness in chest/around heart. Pt is scheduled for appt on August 1st, but wanted to see if there was any possible way to see MD sooner.

## 2011-01-03 NOTE — Telephone Encounter (Signed)
Spoke with patient, he states Conservation officer, nature gave him an appointment with Dr. Shirlee Latch for tomorrow 7/24 th 2012 at 9:00 Am

## 2011-01-04 ENCOUNTER — Encounter: Payer: Self-pay | Admitting: Cardiology

## 2011-01-04 ENCOUNTER — Ambulatory Visit (INDEPENDENT_AMBULATORY_CARE_PROVIDER_SITE_OTHER): Payer: Medicare Other | Admitting: Cardiology

## 2011-01-04 ENCOUNTER — Encounter: Payer: Self-pay | Admitting: *Deleted

## 2011-01-04 DIAGNOSIS — I2510 Atherosclerotic heart disease of native coronary artery without angina pectoris: Secondary | ICD-10-CM

## 2011-01-04 DIAGNOSIS — E785 Hyperlipidemia, unspecified: Secondary | ICD-10-CM

## 2011-01-04 DIAGNOSIS — I10 Essential (primary) hypertension: Secondary | ICD-10-CM

## 2011-01-04 DIAGNOSIS — R079 Chest pain, unspecified: Secondary | ICD-10-CM

## 2011-01-04 LAB — PROTIME-INR: INR: 1.2 ratio — ABNORMAL HIGH (ref 0.8–1.0)

## 2011-01-04 LAB — CBC WITH DIFFERENTIAL/PLATELET
Basophils Relative: 0.5 % (ref 0.0–3.0)
Eosinophils Relative: 1.1 % (ref 0.0–5.0)
HCT: 43.3 % (ref 39.0–52.0)
Lymphs Abs: 1.6 10*3/uL (ref 0.7–4.0)
MCV: 88.6 fl (ref 78.0–100.0)
Monocytes Absolute: 0.5 10*3/uL (ref 0.1–1.0)
RBC: 4.89 Mil/uL (ref 4.22–5.81)
WBC: 4 10*3/uL — ABNORMAL LOW (ref 4.5–10.5)

## 2011-01-04 LAB — LIPID PANEL
Cholesterol: 124 mg/dL (ref 0–200)
LDL Cholesterol: 42 mg/dL (ref 0–99)
VLDL: 17.8 mg/dL (ref 0.0–40.0)

## 2011-01-04 LAB — HEPATIC FUNCTION PANEL
Alkaline Phosphatase: 65 U/L (ref 39–117)
Bilirubin, Direct: 0.1 mg/dL (ref 0.0–0.3)
Total Bilirubin: 0.6 mg/dL (ref 0.3–1.2)

## 2011-01-04 LAB — BASIC METABOLIC PANEL
Chloride: 108 mEq/L (ref 96–112)
Potassium: 3.7 mEq/L (ref 3.5–5.1)

## 2011-01-04 NOTE — Patient Instructions (Signed)
Lab today--Lipid profile/liver profile/BMP/CBC/PT 786.50  414.01  Cardiac Catheterization scheduled for Friday January 07, 2011 . See instruction sheet.  Schedule an appointment with Dr Shirlee Latch in about 3 weeks.

## 2011-01-04 NOTE — Assessment & Plan Note (Signed)
Unable to tolerate any statin.  He is on Niaspan 2000 mg daily.  I will get lipids/LFTs.

## 2011-01-04 NOTE — Assessment & Plan Note (Signed)
Patient is having daily episodes of chest pain relieved by nitroglycerin.  The chest pain occurs at rest and he really does not have symptoms with exertion.  However, he states that the symptoms are very similar to his prior ischemic symptoms.  He says that he has had false negative myoviews in the past.  Given the patient's history, I think that it would be best to take him directly for cardiac catheterization.  I will arrange for it this week.  He will continue ASA 81, Plavix, Niaspan, Bystolic.

## 2011-01-04 NOTE — Assessment & Plan Note (Signed)
BP is under good control on current meds.  

## 2011-01-04 NOTE — Progress Notes (Signed)
PCP: Dr. Oneta Rack  65 yo with history of CAD, HTN, hyperlipidemia presents for followup.  I have not seen him since 2010.  He has had multiple stents in his LAD and RCA with last cath in 2008 showing patent stents.  He had been doing well for a number of years up until about 2 weeks ago.  For the last 2 weeks, he has been having daily episodes of left-sided chest soreness radiating down his left arm.  This pain tends to come on at rest.  It goes away with nitroglycerin.  Yesterday, he had 2 episodes during a meeting.  Interestingly, he does not tend to get the chest pain with exertion.  He exercises at the [facility] several times a week and denies any symptoms with exertion.  He cannot think of any definite trigger for the chest pain.  It does not feel like his reflux.  He says that it feels very similar to prior ischemic pain before his past stents.  He relates that he always has had this pattern of nonexertional chest pain with coronary disease and has been told in the past that his ischemic symptoms were atypical.  He is very worried that he has developed a new blockage.  He denies exertional dyspnea.    ECG: NSR, normal  Labs (7/10): creatinine 1.15, LDL 100, HDL 50, TGs 153  Allergies (verified):  1)  ! Pcn  Past Medical History: 1. Coronary artery disease.  The patient's last left heart catheterization was in November 2008.  EF was 55% on left ventriculogram.  The circumflex was totally occluded after the first obtuse marginal with collaterals supplying the distal circumflex.  The LAD showed luminal irregularities.  The stent in the LAD was patent.  The RCA showed luminal irregularities.  The stent in the mid RCA and PDA were patent.  There were no interventions.  Prior to this, the patient has had multiple percutaneous coronary interventions.  His stents in the LAD, RCA, and PDA appear to have been bare-metal stents. 2. Hyperlipidemia: has tried almost every statin and developed severe myalgias. 3.  Obesity. 4. Chronic low-back pain. 5. Gastroesophageal reflux disease/hiatal hernia.  The patient did have a Nissen fundoplication. 6. History of peptic ulcer disease. 7. Type 2 diabetes, this is well controlled. 8. Hypertension, this is well controlled. 9. History of traumatic spinal fracture in January 2009 after falling off the roof. 10.Cluster headaches. 11.Chronic obstructive pulmonary disease/asthma. 12.Benign prostatic hypertrophy: s/p surgery.  13.Osteoarthritis. 14.Chronic sinusitis s/p surgery.   Family History: The patient's father had coronary artery disease developed in his 5s.  Mother had congestive heart failure.  She had a brother who developed congestive heart failure in his 63s.  She had 2 other brothers who had MIs in their 70s.     Social History: The patient is married.  He has 1 child.  He quit smoking in 1979.  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 He drinks alcohol rarely.     Review of Systems        All systems reviewed and negative except as per HPI.   Current Outpatient Prescriptions  Medication Sig Dispense Refill  . aspirin 81 MG tablet Take 81 mg by mouth daily.        . B Complex-Folic Acid (B COMPLEX-VITAMIN B12 PO) Take by mouth daily.        . calcium carbonate (OS-CAL) 600 MG TABS Take 600 mg by mouth 2 (  two) times daily with a meal.        . cholecalciferol (VITAMIN D) 1000 UNITS tablet Take 2,000 Units by mouth daily.        . Coenzyme Q10 (CO Q 10) 100 MG CAPS Take by mouth daily.        . Evening Primrose Oil 1000 MG CAPS Take by mouth 2 (two) times daily.        . fish oil-omega-3 fatty acids 1000 MG capsule Take 2 g by mouth daily.        . Flaxseed, Linseed, (FLAX SEED OIL) 1000 MG CAPS Take by mouth 2 (two) times daily.        . fluconazole (DIFLUCAN) 150 MG tablet as directed.      . fluticasone (FLONASE) 50 MCG/ACT nasal spray 1 tablet as directed.      . Fluticasone-Salmeterol  (ADVAIR DISKUS) 100-50 MCG/DOSE AEPB Inhale 1 puff into the lungs 2 (two) times daily.        . folic acid (FOLVITE) 800 MCG tablet Take 800 mcg by mouth daily.        Marland Kitchen MAGNESIUM PO Take 1,600 mg by mouth daily.        . Melatonin 3 MG CAPS Take by mouth. TAKE 3 AT BEDTIME        . MILK THISTLE EXTRACT PO Take 1,000 mg by mouth.        . Multiple Vitamin (MULTIVITAMIN) capsule Take 1 capsule by mouth daily.        . nebivolol (BYSTOLIC) 10 MG tablet Take 5 mg by mouth daily.        . niacin (NIASPAN) 1000 MG CR tablet Take 1,000 mg by mouth 2 (two) times daily.        Marland Kitchen NITROSTAT 0.4 MG SL tablet as needed.      . pantoprazole (PROTONIX) 40 MG tablet Take 40 mg by mouth daily.        Marland Kitchen PLAVIX 75 MG tablet Take 1 tablet by mouth daily.      . pregabalin (LYRICA) 100 MG capsule Take 100 mg by mouth daily.        Satira Sark Johns Wort 300 MG CAPS Take by mouth 2 (two) times daily.        . Turmeric 500 MG CAPS Take by mouth daily.        . Valerian 400 MG CAPS Take by mouth 2 (two) times daily.        Marland Kitchen ZETIA 10 MG tablet Take 1 tablet by mouth daily.        BP 114/76  Pulse 71  Ht 5\' 6"  (1.676 m)  Wt 197 lb (89.359 kg)  BMI 31.80 kg/m2 General: NAD, obese.  Neck: No JVD, no thyromegaly or thyroid nodule.  Lungs: Clear to auscultation bilaterally with normal respiratory effort. CV: Nondisplaced PMI.  Heart regular S1/S2, no S3/S4, no murmur.  No peripheral edema.  No carotid bruit.  Normal pedal pulses.  Abdomen: Soft, nontender, no hepatosplenomegaly, no distention.  Neurologic: Alert and oriented x 3.  Psych: Normal affect. Extremities: No clubbing or cyanosis.

## 2011-01-07 ENCOUNTER — Inpatient Hospital Stay (HOSPITAL_BASED_OUTPATIENT_CLINIC_OR_DEPARTMENT_OTHER)
Admission: RE | Admit: 2011-01-07 | Discharge: 2011-01-07 | Disposition: A | Discharge status: Home / Self Care | Payer: Medicare Other | Source: Ambulatory Visit | Attending: Cardiology | Admitting: Cardiology

## 2011-01-07 DIAGNOSIS — I2510 Atherosclerotic heart disease of native coronary artery without angina pectoris: Secondary | ICD-10-CM | POA: Insufficient documentation

## 2011-01-07 DIAGNOSIS — Z9861 Coronary angioplasty status: Secondary | ICD-10-CM | POA: Insufficient documentation

## 2011-01-11 ENCOUNTER — Telehealth: Payer: Self-pay | Admitting: Cardiology

## 2011-01-11 NOTE — Telephone Encounter (Signed)
Labs faxed to [facility] @ [phone]  01/11/11/km

## 2011-01-12 ENCOUNTER — Encounter: Payer: Self-pay | Admitting: Cardiology

## 2011-01-12 ENCOUNTER — Ambulatory Visit: Payer: Medicare Other | Admitting: Cardiology

## 2011-01-21 ENCOUNTER — Ambulatory Visit: Payer: Medicare Other | Admitting: Cardiology

## 2011-01-26 ENCOUNTER — Encounter: Payer: Self-pay | Admitting: Cardiology

## 2011-02-26 NOTE — Cardiovascular Report (Signed)
[name], STAI NO.:  [id number]  MEDICAL RECORD NO.:  [id number]  LOCATION:                                 FACILITY:  PHYSICIAN:  Marca Ancona, MD           DATE OF BIRTH:  DATE OF PROCEDURE:  01/07/2011 DATE OF DISCHARGE:                           CARDIAC CATHETERIZATION   PROCEDURES: 1. Left heart catheterization. 2. Coronary angiography. 3. Left ventriculography.  SURGEON:  Marca Ancona, MD  INDICATIONS:  This is a 65 year old with a history of known coronary artery disease.  He had stents in his right coronary arteries, PDA, and his LAD.  He presented to the office with chest pain symptoms reminiscent of prior ischemic pain.  Therefore, he was taken for heart catheterization.  PROCEDURE NOTE:  After informed consent was obtained, the patient underwent Allen's testing on his right wrist.  The right ulnar artery gave good collateral circulation of the radial side of the hand constituting a positive Allen's test.  Therefore, the patient's right wrist was sterilely prepped and draped.  Lidocaine 1% was used locally to anesthetize the right radial area.  The right radial artery was entered using modified Seldinger technique and a 5-French arterial sheath was placed.  The patient received 4000 units of IV heparin and 3 mg of intraarterial verapamil.  The right coronary artery was engaged using the JR-4 catheter.  Left coronary artery was engaged using the JL- 3.5 catheter and left ventricle was entered using angled pigtail catheter.  There were no complications.  FINDINGS: 1. Hemodynamics:  LV 118/18, aorta 127/70. 2. Left ventriculography:  EF was estimated to be 55%.  There were no     regional wall motion abnormalities in the RAO projection. 3. Right coronary artery:  The right coronary was a dominant vessel.     There was a proximal-to-mid right coronary artery stent.  There was     about 25% in-stent restenosis.  There was about 30%  distal RCA     stenosis.  The PLV and PDA were both moderate-sized vessels.  There     was about 40-50% ostial PLV stenosis.  The stent in the PDA was     patent.  There were luminal irregularities diffusely in the PDA.     The PLV did provide right-to-left collaterals to the distal     circumflex and also supplied 2 small-to-moderate obtuse marginals. 4. Left main:  The left main had no angiographic coronary disease. 5. Left circumflex system:  The left circumflex was totally occluded     after the first obtuse marginal.  This was known from prior heart     catheterizations.  The first obtuse marginal was a small-to-     moderate vessel with luminal irregularities.  The distal circumflex     was reconstituted via collaterals in the PLV.  There were 2 small-     to-moderate obtuse marginals filled via the collaterals. 6. LAD system:  There was a proximal LAD stent with 30-40% in-stent     restenosis.  The remainder of the LAD had luminal irregularities.     There was a small first diagonal.  There was a  moderate-sized     second diagonal that had about 40% ostial stenosis.  IMPRESSION:  The patient has a known occluded circumflex which was seen again today.  There were good collaterals from the right coronary artery system.  The patient's other vessels have no significant disease.  His stents were patent.  We will plan on medical treatment for now.  He will try Prilosec for possible reflux symptoms.     Marca Ancona, MD     DM/MEDQ  D:  01/07/2011  T:  01/07/2011  Job:  [id number]  cc:   Lucky Cowboy, M.D.  Electronically Signed by Marca Ancona MD on 02/26/2011 10:18:13 PM

## 2011-03-02 ENCOUNTER — Encounter: Payer: Self-pay | Admitting: Cardiology

## 2011-03-02 ENCOUNTER — Ambulatory Visit (INDEPENDENT_AMBULATORY_CARE_PROVIDER_SITE_OTHER): Payer: Medicare Other | Admitting: Cardiology

## 2011-03-02 VITALS — BP 124/84 | HR 65 | Ht 66.0 in | Wt 195.0 lb

## 2011-03-02 DIAGNOSIS — E785 Hyperlipidemia, unspecified: Secondary | ICD-10-CM

## 2011-03-02 DIAGNOSIS — I2510 Atherosclerotic heart disease of native coronary artery without angina pectoris: Secondary | ICD-10-CM

## 2011-03-02 NOTE — Patient Instructions (Addendum)
Your physician wants you to follow-up in: 1 year with Dr Shirlee Latch.(September  2013). You will receive a reminder letter in the mail two months in advance. If you don't receive a letter, please call our office to schedule the follow-up appointment.

## 2011-03-03 LAB — CBC
Hemoglobin: 10.5 — ABNORMAL LOW
MCHC: 33.7
MCV: 88.9
RBC: 3.49 — ABNORMAL LOW
RDW: 13.9

## 2011-03-03 LAB — DIFFERENTIAL
Lymphocytes Relative: 8 — ABNORMAL LOW
Lymphs Abs: 1
Monocytes Absolute: 0.9
Monocytes Relative: 8
Neutro Abs: 9.4 — ABNORMAL HIGH

## 2011-03-03 LAB — COMPREHENSIVE METABOLIC PANEL
BUN: 10
CO2: 26
Calcium: 7.9 — ABNORMAL LOW
Creatinine, Ser: 0.86
GFR calc non Af Amer: >60
Glucose, Bld: 160 — ABNORMAL HIGH
Total Protein: 5.9 — ABNORMAL LOW

## 2011-03-04 NOTE — Assessment & Plan Note (Signed)
Patient had left heart cath in 7/12 with no change compared to prior cath in 11/08.  He has a totally occluded CFX with collaterals.  His chest pain seems to have been noncardiac.  It may have been GERD as Protonix seems to have resolved it.  Continue ASA 81, nebivolol, Plavix, and niacin.  He has been unable to tolerate any statins but his LDL is well-controlled.

## 2011-03-04 NOTE — Progress Notes (Signed)
PCP: Dr. Oneta Rack  65 yo with history of CAD, HTN, hyperlipidemia presents for followup.  He has had multiple stents in his LAD and RCA with last cath in 2008 showing patent stents.  He had been doing well for a number of years until a couple of weeks prior to last appointment when he started having daily episodes of left-sided chest soreness radiating down his left arm.  This pain tended to come on at rest.  It resolved with nitroglycerin.  He said that it felt very similar to prior ischemic pain before his past stents.  He related that he always has had nonexertional chest pain with coronary disease and has been told in the past that his ischemic symptoms were atypical.    I took him to catheterization in July.  This showed a totally occluded CFX (known from prior caths) with collaterals from the RCA.  There was no other significant coronary disease and EF was 55%.  I thought that perhaps the chest pain was GERD and started him on Protonix.  His chest pain has actually resolved.  He is doing well now with no exertional symptoms.  He has been going to the [facility] under the [employer] program three times a week.    ECG: NSR, normal  Labs (7/10): creatinine 1.15, LDL 100, HDL 50, TGs 153 Labs (7/12): K 3.7, creatinine 0.9, LDL 42, HDL 64  Allergies (verified):  1)  ! Pcn  Past Medical History: 1. Coronary artery disease.  Patient had LHC in November 2008.  EF was 55% on left ventriculogram.  The circumflex was totally occluded after the first obtuse marginal with collaterals supplying the distal circumflex.  The LAD showed luminal irregularities.  The stent in the LAD was patent.  The RCA showed luminal irregularities.  The stent in the mid RCA and PDA were patent.  There were no interventions.  Patient had LHC in 7/12 with no significant changes from 11/08.   2. Hyperlipidemia: has tried almost every statin and developed severe myalgias. 3. Obesity 4. Chronic low-back pain. 5. Gastroesophageal  reflux disease/hiatal hernia.  The patient did have a Nissen fundoplication. 6. History of peptic ulcer disease. 7. Type 2 diabetes, this is well controlled. 8. Hypertension, this is well controlled. 9. History of traumatic spinal fracture in January 2009 after falling off the roof. 10.Cluster headaches. 11.Chronic obstructive pulmonary disease/asthma. 12.Benign prostatic hypertrophy: s/p surgery.  13.Osteoarthritis. 14.Chronic sinusitis s/p surgery.   Family History: The patient's father had coronary artery disease developed in his 54s.  Mother had congestive heart failure.  She had a brother who developed congestive heart failure in his 57s.  She had 2 other brothers who had MIs in their 24s.     Social History: The patient is married.  He has 1 child.  He quit smoking in 1979.  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 He drinks alcohol rarely.      Current Outpatient Prescriptions  Medication Sig Dispense Refill  . aspirin 81 MG tablet Take 1 tablet (81 mg total) by mouth daily.      . B Complex-Folic Acid (B COMPLEX-VITAMIN B12 PO) Take by mouth daily.        . cholecalciferol (VITAMIN D) 1000 UNITS tablet Take 2,000 Units by mouth daily.        . Coenzyme Q10 (CO Q 10) 100 MG CAPS Take by mouth daily.        . Evening  Primrose Oil 1000 MG CAPS Take by mouth 2 (two) times daily.        . fish oil-omega-3 fatty acids 1000 MG capsule Take 2 g by mouth daily.        . Flaxseed, Linseed, (FLAX SEED OIL) 1000 MG CAPS Take by mouth 2 (two) times daily.        . fluconazole (DIFLUCAN) 150 MG tablet as directed.      . Fluticasone-Salmeterol (ADVAIR DISKUS) 100-50 MCG/DOSE AEPB Inhale 1 puff into the lungs 2 (two) times daily.        . folic acid (FOLVITE) 800 MCG tablet Take 800 mcg by mouth daily.        . Garlic 2000 MG CAPS Take by mouth daily.        Marland Kitchen MAGNESIUM PO Take 1,600 mg by mouth daily.        . Melatonin 3 MG CAPS Take 1  mg by mouth. Take one at bedtime       . Multiple Vitamin (MULTIVITAMIN) capsule Take 1 capsule by mouth daily.        . nebivolol (BYSTOLIC) 10 MG tablet Take 5 mg by mouth daily.        . niacin (NIASPAN) 1000 MG CR tablet Take 1,000 mg by mouth 2 (two) times daily.        Marland Kitchen NITROSTAT 0.4 MG SL tablet as needed.      . pantoprazole (PROTONIX) 40 MG tablet Take 40 mg by mouth daily.        Marland Kitchen PLAVIX 75 MG tablet Take 1 tablet by mouth daily.      . pregabalin (LYRICA) 100 MG capsule Take 100 mg by mouth daily.        Satira Sark Johns Wort 300 MG CAPS Take by mouth 2 (two) times daily.        . Turmeric 500 MG CAPS Take by mouth daily.        . Valerian 400 MG CAPS Take by mouth 2 (two) times daily.        Marland Kitchen ZETIA 10 MG tablet Take 1 tablet by mouth daily.      . calcium carbonate (OS-CAL) 600 MG TABS Take 600 mg by mouth 2 (two) times daily with a meal.        . MILK THISTLE EXTRACT PO Take 1,000 mg by mouth every other day.         BP 124/84  Pulse 65  Ht 5\' 6"  (1.676 m)  Wt 195 lb (88.451 kg)  BMI 31.47 kg/m2 General: NAD, obese.  Neck: No JVD, no thyromegaly or thyroid nodule.  Lungs: Clear to auscultation bilaterally with normal respiratory effort. CV: Nondisplaced PMI.  Heart regular S1/S2, no S3/S4, no murmur.  No peripheral edema.  No carotid bruit.  Normal pedal pulses.  Abdomen: Soft, nontender, no hepatosplenomegaly, no distention.  Neurologic: Alert and oriented x 3.  Psych: Normal affect. Extremities: No clubbing or cyanosis.

## 2011-03-04 NOTE — Assessment & Plan Note (Signed)
LDL at goal (< 70) in 7/12.

## 2011-03-07 LAB — URINALYSIS, ROUTINE W REFLEX MICROSCOPIC
Glucose, UA: NEGATIVE
Hgb urine dipstick: NEGATIVE
Specific Gravity, Urine: 1.019
Urobilinogen, UA: 0.2

## 2011-03-07 LAB — BASIC METABOLIC PANEL
BUN: 13
Chloride: 105
Glucose, Bld: 130 — ABNORMAL HIGH
Potassium: 3.3 — ABNORMAL LOW

## 2011-03-07 LAB — HEMOGLOBIN AND HEMATOCRIT, BLOOD: Hemoglobin: 12.5 — ABNORMAL LOW

## 2011-03-18 LAB — BLOOD GAS, ARTERIAL
Acid-Base Excess: 1.4
Acid-Base Excess: 1.5
Acid-Base Excess: 1.9
Bicarbonate: 24.5 — ABNORMAL HIGH
Drawn by: 129711
FIO2: 0.4
FIO2: 0.5
MECHVT: 750
MECHVT: 750
O2 Saturation: 97.5
O2 Saturation: 98
PEEP: 5
Patient temperature: 98.6
Patient temperature: 98.6
RATE: 12
RATE: 12
TCO2: 25.5
TCO2: 25.9
pCO2 arterial: 32.8 — ABNORMAL LOW
pCO2 arterial: 36.6
pH, Arterial: 7.457 — ABNORMAL HIGH
pO2, Arterial: 96.5

## 2011-03-18 LAB — I-STAT 8, (EC8 V) (CONVERTED LAB)
Acid-Base Excess: 2
Bicarbonate: 22.8
Potassium: 4.5
TCO2: 24
pH, Ven: 7.563 — ABNORMAL HIGH

## 2011-03-18 LAB — CBC
HCT: 29.4 — ABNORMAL LOW
HCT: 45.4
Hemoglobin: 15.4
Hemoglobin: 9.1 — ABNORMAL LOW
Hemoglobin: 9.9 — ABNORMAL LOW
MCHC: 34
MCHC: 34.3
MCHC: 35
MCV: 88.3
MCV: 89.6
MCV: 89.9
Platelets: 127 — ABNORMAL LOW
Platelets: 131 — ABNORMAL LOW
RBC: 2.95 — ABNORMAL LOW
RBC: 3.2 — ABNORMAL LOW
RBC: 4.09 — ABNORMAL LOW
RBC: 5.06
WBC: 10.6 — ABNORMAL HIGH
WBC: 5.7
WBC: 6.4
WBC: 8

## 2011-03-18 LAB — POCT I-STAT 7, (LYTES, BLD GAS, ICA,H+H)
Bicarbonate: 21.3
Calcium, Ion: 1 — ABNORMAL LOW
HCT: 23 — ABNORMAL LOW
Hemoglobin: 7.8 — CL
Operator id: 238531
Potassium: 4.3
Sodium: 138
pH, Arterial: 7.442

## 2011-03-18 LAB — COMPREHENSIVE METABOLIC PANEL
ALT: 27
AST: 35
CO2: 28
Chloride: 105
Creatinine, Ser: 0.76
GFR calc Af Amer: >60
GFR calc non Af Amer: >60
Glucose, Bld: 116 — ABNORMAL HIGH
Sodium: 136
Total Bilirubin: 0.8

## 2011-03-18 LAB — POCT I-STAT 4, (NA,K, GLUC, HGB,HCT)
HCT: 24 — ABNORMAL LOW
Hemoglobin: 8.2 — ABNORMAL LOW

## 2011-03-18 LAB — PROTIME-INR
INR: 1
Prothrombin Time: 13.4

## 2011-03-18 LAB — BASIC METABOLIC PANEL
Calcium: 7.4 — ABNORMAL LOW
Calcium: 8.2 — ABNORMAL LOW
Chloride: 108
Creatinine, Ser: 0.8
Creatinine, Ser: 0.97
GFR calc Af Amer: >60
GFR calc Af Amer: >60
GFR calc non Af Amer: >60
GFR calc non Af Amer: >60
GFR calc non Af Amer: >60
Potassium: 3.8
Sodium: 133 — ABNORMAL LOW

## 2011-03-18 LAB — TYPE AND SCREEN
ABO/RH(D): O POS
Antibody Screen: NEGATIVE

## 2011-03-18 LAB — DIFFERENTIAL
Basophils Absolute: 0
Basophils Absolute: 0
Eosinophils Absolute: 0
Eosinophils Relative: 0
Lymphocytes Relative: 31
Neutro Abs: 5.2
Neutrophils Relative %: 81 — ABNORMAL HIGH

## 2011-03-18 LAB — PREALBUMIN: Prealbumin: 5.2 — ABNORMAL LOW

## 2011-03-18 LAB — SAMPLE TO BLOOD BANK

## 2011-03-18 LAB — POCT I-STAT 3, ART BLOOD GAS (G3+)
Acid-base deficit: 2
Bicarbonate: 21.7
O2 Saturation: 100
TCO2: 23
pO2, Arterial: 265 — ABNORMAL HIGH

## 2011-03-18 LAB — MAGNESIUM: Magnesium: 2

## 2011-03-18 LAB — PREPARE PLATELET PHERESIS

## 2011-03-18 LAB — APTT: aPTT: 25

## 2011-04-14 ENCOUNTER — Emergency Department (HOSPITAL_COMMUNITY)
Admission: EM | Admit: 2011-04-14 | Discharge: 2011-04-14 | Disposition: A | Discharge status: Home / Self Care | Payer: Medicare Other | Attending: Emergency Medicine | Admitting: Emergency Medicine

## 2011-04-14 DIAGNOSIS — T23179A Burn of first degree of unspecified wrist, initial encounter: Secondary | ICD-10-CM | POA: Insufficient documentation

## 2011-04-14 DIAGNOSIS — E119 Type 2 diabetes mellitus without complications: Secondary | ICD-10-CM | POA: Insufficient documentation

## 2011-04-14 DIAGNOSIS — Z79899 Other long term (current) drug therapy: Secondary | ICD-10-CM | POA: Insufficient documentation

## 2011-04-14 DIAGNOSIS — R51 Headache: Secondary | ICD-10-CM | POA: Insufficient documentation

## 2011-04-14 DIAGNOSIS — T2016XA Burn of first degree of forehead and cheek, initial encounter: Secondary | ICD-10-CM | POA: Insufficient documentation

## 2011-04-14 DIAGNOSIS — M25539 Pain in unspecified wrist: Secondary | ICD-10-CM | POA: Insufficient documentation

## 2011-04-14 DIAGNOSIS — I252 Old myocardial infarction: Secondary | ICD-10-CM | POA: Insufficient documentation

## 2011-04-14 DIAGNOSIS — X020XXA Exposure to flames in controlled fire in building or structure, initial encounter: Secondary | ICD-10-CM | POA: Insufficient documentation

## 2011-04-14 DIAGNOSIS — I10 Essential (primary) hypertension: Secondary | ICD-10-CM | POA: Insufficient documentation

## 2011-04-14 LAB — GLUCOSE, CAPILLARY: Glucose-Capillary: 95 mg/dL (ref 70–99)

## 2011-07-01 DIAGNOSIS — K921 Melena: Secondary | ICD-10-CM | POA: Diagnosis not present

## 2011-07-01 DIAGNOSIS — Z79899 Other long term (current) drug therapy: Secondary | ICD-10-CM | POA: Diagnosis not present

## 2011-07-20 DIAGNOSIS — E782 Mixed hyperlipidemia: Secondary | ICD-10-CM | POA: Diagnosis not present

## 2011-07-20 DIAGNOSIS — E559 Vitamin D deficiency, unspecified: Secondary | ICD-10-CM | POA: Diagnosis not present

## 2011-07-20 DIAGNOSIS — Z79899 Other long term (current) drug therapy: Secondary | ICD-10-CM | POA: Diagnosis not present

## 2011-07-20 DIAGNOSIS — R7309 Other abnormal glucose: Secondary | ICD-10-CM | POA: Diagnosis not present

## 2011-07-20 DIAGNOSIS — I10 Essential (primary) hypertension: Secondary | ICD-10-CM | POA: Diagnosis not present

## 2011-07-21 DIAGNOSIS — K921 Melena: Secondary | ICD-10-CM | POA: Diagnosis not present

## 2011-07-21 DIAGNOSIS — Z800 Family history of malignant neoplasm of digestive organs: Secondary | ICD-10-CM | POA: Diagnosis not present

## 2011-07-22 DIAGNOSIS — J019 Acute sinusitis, unspecified: Secondary | ICD-10-CM | POA: Diagnosis not present

## 2011-08-01 DIAGNOSIS — J322 Chronic ethmoidal sinusitis: Secondary | ICD-10-CM | POA: Diagnosis not present

## 2011-08-02 DIAGNOSIS — J310 Chronic rhinitis: Secondary | ICD-10-CM | POA: Diagnosis not present

## 2011-08-02 DIAGNOSIS — J343 Hypertrophy of nasal turbinates: Secondary | ICD-10-CM | POA: Diagnosis not present

## 2011-08-02 DIAGNOSIS — J323 Chronic sphenoidal sinusitis: Secondary | ICD-10-CM | POA: Diagnosis not present

## 2011-08-03 ENCOUNTER — Other Ambulatory Visit (INDEPENDENT_AMBULATORY_CARE_PROVIDER_SITE_OTHER): Payer: Self-pay | Admitting: Otolaryngology

## 2011-08-03 DIAGNOSIS — J329 Chronic sinusitis, unspecified: Secondary | ICD-10-CM

## 2011-08-09 ENCOUNTER — Ambulatory Visit
Admission: RE | Admit: 2011-08-09 | Discharge: 2011-08-09 | Disposition: A | Discharge status: Home / Self Care | Payer: Medicare Other | Source: Ambulatory Visit | Attending: Otolaryngology | Admitting: Otolaryngology

## 2011-08-09 DIAGNOSIS — J329 Chronic sinusitis, unspecified: Secondary | ICD-10-CM

## 2011-08-09 DIAGNOSIS — J320 Chronic maxillary sinusitis: Secondary | ICD-10-CM | POA: Diagnosis not present

## 2011-08-09 DIAGNOSIS — J342 Deviated nasal septum: Secondary | ICD-10-CM | POA: Diagnosis not present

## 2011-08-15 DIAGNOSIS — J320 Chronic maxillary sinusitis: Secondary | ICD-10-CM | POA: Diagnosis not present

## 2011-08-24 DIAGNOSIS — D128 Benign neoplasm of rectum: Secondary | ICD-10-CM | POA: Diagnosis not present

## 2011-08-24 DIAGNOSIS — Z1211 Encounter for screening for malignant neoplasm of colon: Secondary | ICD-10-CM | POA: Diagnosis not present

## 2011-08-24 DIAGNOSIS — D129 Benign neoplasm of anus and anal canal: Secondary | ICD-10-CM | POA: Diagnosis not present

## 2011-08-24 DIAGNOSIS — K648 Other hemorrhoids: Secondary | ICD-10-CM | POA: Diagnosis not present

## 2011-08-24 DIAGNOSIS — D126 Benign neoplasm of colon, unspecified: Secondary | ICD-10-CM | POA: Diagnosis not present

## 2011-08-24 DIAGNOSIS — K5730 Diverticulosis of large intestine without perforation or abscess without bleeding: Secondary | ICD-10-CM | POA: Diagnosis not present

## 2011-09-12 DIAGNOSIS — J323 Chronic sphenoidal sinusitis: Secondary | ICD-10-CM | POA: Diagnosis not present

## 2011-09-12 DIAGNOSIS — J320 Chronic maxillary sinusitis: Secondary | ICD-10-CM | POA: Diagnosis not present

## 2011-09-15 ENCOUNTER — Encounter: Payer: Self-pay | Admitting: Cardiology

## 2011-09-26 ENCOUNTER — Other Ambulatory Visit (INDEPENDENT_AMBULATORY_CARE_PROVIDER_SITE_OTHER): Payer: Self-pay | Admitting: Otolaryngology

## 2011-09-28 ENCOUNTER — Ambulatory Visit
Admission: RE | Admit: 2011-09-28 | Discharge: 2011-09-28 | Disposition: A | Discharge status: Home / Self Care | Payer: Medicare Other | Source: Ambulatory Visit | Attending: Otolaryngology | Admitting: Otolaryngology

## 2011-09-28 DIAGNOSIS — J329 Chronic sinusitis, unspecified: Secondary | ICD-10-CM | POA: Diagnosis not present

## 2011-09-28 DIAGNOSIS — J342 Deviated nasal septum: Secondary | ICD-10-CM | POA: Diagnosis not present

## 2011-10-10 DIAGNOSIS — J310 Chronic rhinitis: Secondary | ICD-10-CM | POA: Diagnosis not present

## 2011-10-10 DIAGNOSIS — J342 Deviated nasal septum: Secondary | ICD-10-CM | POA: Diagnosis not present

## 2011-10-10 DIAGNOSIS — J320 Chronic maxillary sinusitis: Secondary | ICD-10-CM | POA: Diagnosis not present

## 2011-10-19 DIAGNOSIS — E559 Vitamin D deficiency, unspecified: Secondary | ICD-10-CM | POA: Diagnosis not present

## 2011-10-19 DIAGNOSIS — Z79899 Other long term (current) drug therapy: Secondary | ICD-10-CM | POA: Diagnosis not present

## 2011-10-19 DIAGNOSIS — R7309 Other abnormal glucose: Secondary | ICD-10-CM | POA: Diagnosis not present

## 2011-10-19 DIAGNOSIS — N3000 Acute cystitis without hematuria: Secondary | ICD-10-CM | POA: Diagnosis not present

## 2011-10-19 DIAGNOSIS — E782 Mixed hyperlipidemia: Secondary | ICD-10-CM | POA: Diagnosis not present

## 2011-10-19 DIAGNOSIS — I10 Essential (primary) hypertension: Secondary | ICD-10-CM | POA: Diagnosis not present

## 2011-10-21 DIAGNOSIS — R5383 Other fatigue: Secondary | ICD-10-CM | POA: Diagnosis not present

## 2011-10-21 DIAGNOSIS — N529 Male erectile dysfunction, unspecified: Secondary | ICD-10-CM | POA: Diagnosis not present

## 2011-11-14 DIAGNOSIS — I10 Essential (primary) hypertension: Secondary | ICD-10-CM | POA: Diagnosis not present

## 2011-12-27 DIAGNOSIS — Z961 Presence of intraocular lens: Secondary | ICD-10-CM | POA: Diagnosis not present

## 2011-12-27 DIAGNOSIS — H1045 Other chronic allergic conjunctivitis: Secondary | ICD-10-CM | POA: Diagnosis not present

## 2011-12-27 DIAGNOSIS — H04129 Dry eye syndrome of unspecified lacrimal gland: Secondary | ICD-10-CM | POA: Diagnosis not present

## 2011-12-27 DIAGNOSIS — E119 Type 2 diabetes mellitus without complications: Secondary | ICD-10-CM | POA: Diagnosis not present

## 2012-01-02 DIAGNOSIS — H0230 Blepharochalasis unspecified eye, unspecified eyelid: Secondary | ICD-10-CM | POA: Diagnosis not present

## 2012-01-02 DIAGNOSIS — H02409 Unspecified ptosis of unspecified eyelid: Secondary | ICD-10-CM | POA: Diagnosis not present

## 2012-01-03 DIAGNOSIS — S22009A Unspecified fracture of unspecified thoracic vertebra, initial encounter for closed fracture: Secondary | ICD-10-CM | POA: Diagnosis not present

## 2012-01-04 ENCOUNTER — Other Ambulatory Visit: Payer: Self-pay | Admitting: Neurosurgery

## 2012-01-04 DIAGNOSIS — M542 Cervicalgia: Secondary | ICD-10-CM

## 2012-01-04 DIAGNOSIS — M549 Dorsalgia, unspecified: Secondary | ICD-10-CM

## 2012-01-06 ENCOUNTER — Ambulatory Visit
Admission: RE | Admit: 2012-01-06 | Discharge: 2012-01-06 | Disposition: A | Discharge status: Home / Self Care | Payer: Medicare Other | Source: Ambulatory Visit | Attending: Neurosurgery | Admitting: Neurosurgery

## 2012-01-06 ENCOUNTER — Other Ambulatory Visit: Payer: Medicare Other

## 2012-01-06 DIAGNOSIS — M549 Dorsalgia, unspecified: Secondary | ICD-10-CM | POA: Diagnosis not present

## 2012-01-06 DIAGNOSIS — M542 Cervicalgia: Secondary | ICD-10-CM | POA: Diagnosis not present

## 2012-01-09 DIAGNOSIS — J310 Chronic rhinitis: Secondary | ICD-10-CM | POA: Diagnosis not present

## 2012-01-09 DIAGNOSIS — J323 Chronic sphenoidal sinusitis: Secondary | ICD-10-CM | POA: Diagnosis not present

## 2012-01-19 ENCOUNTER — Encounter: Payer: Self-pay | Admitting: Cardiology

## 2012-01-19 DIAGNOSIS — E559 Vitamin D deficiency, unspecified: Secondary | ICD-10-CM | POA: Diagnosis not present

## 2012-01-19 DIAGNOSIS — N419 Inflammatory disease of prostate, unspecified: Secondary | ICD-10-CM | POA: Diagnosis not present

## 2012-01-19 DIAGNOSIS — Z79899 Other long term (current) drug therapy: Secondary | ICD-10-CM | POA: Diagnosis not present

## 2012-01-19 DIAGNOSIS — E782 Mixed hyperlipidemia: Secondary | ICD-10-CM | POA: Diagnosis not present

## 2012-01-19 DIAGNOSIS — Z23 Encounter for immunization: Secondary | ICD-10-CM | POA: Diagnosis not present

## 2012-01-19 DIAGNOSIS — R7309 Other abnormal glucose: Secondary | ICD-10-CM | POA: Diagnosis not present

## 2012-01-19 DIAGNOSIS — I10 Essential (primary) hypertension: Secondary | ICD-10-CM | POA: Diagnosis not present

## 2012-01-24 DIAGNOSIS — M546 Pain in thoracic spine: Secondary | ICD-10-CM | POA: Diagnosis not present

## 2012-01-24 DIAGNOSIS — IMO0002 Reserved for concepts with insufficient information to code with codable children: Secondary | ICD-10-CM | POA: Diagnosis not present

## 2012-01-24 DIAGNOSIS — M542 Cervicalgia: Secondary | ICD-10-CM | POA: Diagnosis not present

## 2012-02-08 ENCOUNTER — Encounter (HOSPITAL_COMMUNITY): Payer: Self-pay | Admitting: Pharmacy Technician

## 2012-02-08 ENCOUNTER — Other Ambulatory Visit: Payer: Self-pay | Admitting: Neurosurgery

## 2012-02-10 DIAGNOSIS — H01029 Squamous blepharitis unspecified eye, unspecified eyelid: Secondary | ICD-10-CM | POA: Diagnosis not present

## 2012-02-10 DIAGNOSIS — H04129 Dry eye syndrome of unspecified lacrimal gland: Secondary | ICD-10-CM | POA: Diagnosis not present

## 2012-02-10 DIAGNOSIS — H11439 Conjunctival hyperemia, unspecified eye: Secondary | ICD-10-CM | POA: Diagnosis not present

## 2012-02-10 DIAGNOSIS — H02429 Myogenic ptosis of unspecified eyelid: Secondary | ICD-10-CM | POA: Diagnosis not present

## 2012-02-14 NOTE — Pre-Procedure Instructions (Addendum)
[address] [facility]  02/14/2012   Your procedure is scheduled on:  Mon, Sept 9 @ 10:35 AM  Report to [facility] at 8:30 AM.  Call this number if you have problems the morning of surgery: [phone]   Remember:   Do not eat food:After Midnight.  Take these medicines the morning of surgery with A SIP OF WATER: Alprazolam(Xanax),Fluticasone(Advair)<Bring Your Inhaler With You>,Lyrica(Pregabalin),and Protonix(Pantoprazole), METOPROLOL   Do not wear jewelry  Do not wear lotions, powders, or colognes  Men may shave face and neck.  Do not bring valuables to the hospital.  Contacts, dentures or bridgework may not be worn into surgery.  Leave suitcase in the car. After surgery it may be brought to your room.  For patients admitted to the hospital, checkout time is 11:00 AM the day of discharge.   Patients discharged the day of surgery will not be allowed to drive home.    Special Instructions: CHG Shower Use Special Wash: 1/2 bottle night before surgery and 1/2 bottle morning of surgery.   Please read over the following fact sheets that you were given: Pain Booklet, Coughing and Deep Breathing, MRSA Information and Surgical Site Infection Prevention

## 2012-02-15 ENCOUNTER — Encounter (HOSPITAL_COMMUNITY): Payer: Self-pay

## 2012-02-15 ENCOUNTER — Encounter (HOSPITAL_COMMUNITY)
Admission: RE | Admit: 2012-02-15 | Discharge: 2012-02-15 | Disposition: A | Discharge status: Home / Self Care | Payer: Medicare Other | Source: Ambulatory Visit | Attending: Neurosurgery | Admitting: Neurosurgery

## 2012-02-15 ENCOUNTER — Ambulatory Visit (HOSPITAL_COMMUNITY)
Admission: RE | Admit: 2012-02-15 | Discharge: 2012-02-15 | Disposition: A | Discharge status: Home / Self Care | Payer: Medicare Other | Source: Ambulatory Visit | Attending: Neurosurgery | Admitting: Neurosurgery

## 2012-02-15 DIAGNOSIS — J449 Chronic obstructive pulmonary disease, unspecified: Secondary | ICD-10-CM | POA: Diagnosis not present

## 2012-02-15 DIAGNOSIS — Z01812 Encounter for preprocedural laboratory examination: Secondary | ICD-10-CM | POA: Insufficient documentation

## 2012-02-15 DIAGNOSIS — Z01810 Encounter for preprocedural cardiovascular examination: Secondary | ICD-10-CM | POA: Insufficient documentation

## 2012-02-15 DIAGNOSIS — Z01818 Encounter for other preprocedural examination: Secondary | ICD-10-CM | POA: Insufficient documentation

## 2012-02-15 DIAGNOSIS — J4489 Other specified chronic obstructive pulmonary disease: Secondary | ICD-10-CM | POA: Insufficient documentation

## 2012-02-15 DIAGNOSIS — Z01811 Encounter for preprocedural respiratory examination: Secondary | ICD-10-CM | POA: Diagnosis not present

## 2012-02-15 HISTORY — DX: Unspecified asthma, uncomplicated: J45.909

## 2012-02-15 HISTORY — DX: Myoneural disorder, unspecified: G70.9

## 2012-02-15 HISTORY — DX: Depression, unspecified: F32.A

## 2012-02-15 HISTORY — DX: Anxiety disorder, unspecified: F41.9

## 2012-02-15 HISTORY — DX: Major depressive disorder, single episode, unspecified: F32.9

## 2012-02-15 HISTORY — DX: Personal history of other medical treatment: Z92.89

## 2012-02-15 HISTORY — DX: Persons encountering health services in other specified circumstances: Z76.89

## 2012-02-15 LAB — SURGICAL PCR SCREEN
MRSA, PCR: NEGATIVE
Staphylococcus aureus: NEGATIVE

## 2012-02-15 LAB — BASIC METABOLIC PANEL
BUN: 17 mg/dL (ref 6–23)
Chloride: 101 mEq/L (ref 96–112)
GFR calc non Af Amer: 73 mL/min — ABNORMAL LOW (ref >90)
Glucose, Bld: 79 mg/dL (ref 70–99)
Potassium: 4 mEq/L (ref 3.5–5.1)
Sodium: 139 mEq/L (ref 135–145)

## 2012-02-15 LAB — CBC
HCT: 47.5 % (ref 39.0–52.0)
Hemoglobin: 16.1 g/dL (ref 13.0–17.0)
MCH: 30.1 pg (ref 26.0–34.0)
RBC: 5.34 MIL/uL (ref 4.22–5.81)

## 2012-02-15 NOTE — Progress Notes (Signed)
Spoke with Rene Kocher in Dr. Lonie Peak office, he is not available, order for surgery consent etc. will be further addressed DOS.

## 2012-02-16 NOTE — Progress Notes (Signed)
Cardiac history reviewed.  Visit from 02/23/2011 sufficient if no new cardiac symptoms present.

## 2012-02-17 DIAGNOSIS — J0100 Acute maxillary sinusitis, unspecified: Secondary | ICD-10-CM | POA: Diagnosis not present

## 2012-02-20 ENCOUNTER — Ambulatory Visit (HOSPITAL_COMMUNITY): Payer: Medicare Other | Admitting: Anesthesiology

## 2012-02-20 ENCOUNTER — Encounter (HOSPITAL_COMMUNITY): Payer: Self-pay | Admitting: *Deleted

## 2012-02-20 ENCOUNTER — Encounter (HOSPITAL_COMMUNITY): Payer: Self-pay | Admitting: Anesthesiology

## 2012-02-20 ENCOUNTER — Inpatient Hospital Stay (HOSPITAL_COMMUNITY)
Admission: RE | Admit: 2012-02-20 | Discharge: 2012-02-21 | DRG: 497 | Disposition: A | Discharge status: Home / Self Care | Payer: Medicare Other | Source: Ambulatory Visit | Attending: Neurosurgery | Admitting: Neurosurgery

## 2012-02-20 ENCOUNTER — Encounter (HOSPITAL_COMMUNITY)
Admission: RE | Disposition: A | Discharge status: Home / Self Care | Payer: Self-pay | Source: Ambulatory Visit | Attending: Neurosurgery

## 2012-02-20 DIAGNOSIS — Z79899 Other long term (current) drug therapy: Secondary | ICD-10-CM

## 2012-02-20 DIAGNOSIS — Y831 Surgical operation with implant of artificial internal device as the cause of abnormal reaction of the patient, or of later complication, without mention of misadventure at the time of the procedure: Secondary | ICD-10-CM | POA: Diagnosis present

## 2012-02-20 DIAGNOSIS — F329 Major depressive disorder, single episode, unspecified: Secondary | ICD-10-CM | POA: Diagnosis present

## 2012-02-20 DIAGNOSIS — K219 Gastro-esophageal reflux disease without esophagitis: Secondary | ICD-10-CM | POA: Diagnosis not present

## 2012-02-20 DIAGNOSIS — Z8249 Family history of ischemic heart disease and other diseases of the circulatory system: Secondary | ICD-10-CM

## 2012-02-20 DIAGNOSIS — E119 Type 2 diabetes mellitus without complications: Secondary | ICD-10-CM | POA: Diagnosis present

## 2012-02-20 DIAGNOSIS — I2510 Atherosclerotic heart disease of native coronary artery without angina pectoris: Secondary | ICD-10-CM | POA: Diagnosis not present

## 2012-02-20 DIAGNOSIS — F411 Generalized anxiety disorder: Secondary | ICD-10-CM | POA: Diagnosis present

## 2012-02-20 DIAGNOSIS — M5124 Other intervertebral disc displacement, thoracic region: Secondary | ICD-10-CM | POA: Diagnosis not present

## 2012-02-20 DIAGNOSIS — T84498A Other mechanical complication of other internal orthopedic devices, implants and grafts, initial encounter: Secondary | ICD-10-CM | POA: Diagnosis not present

## 2012-02-20 DIAGNOSIS — Y92009 Unspecified place in unspecified non-institutional (private) residence as the place of occurrence of the external cause: Secondary | ICD-10-CM

## 2012-02-20 DIAGNOSIS — Z7902 Long term (current) use of antithrombotics/antiplatelets: Secondary | ICD-10-CM

## 2012-02-20 DIAGNOSIS — M546 Pain in thoracic spine: Secondary | ICD-10-CM | POA: Diagnosis not present

## 2012-02-20 DIAGNOSIS — M549 Dorsalgia, unspecified: Secondary | ICD-10-CM | POA: Diagnosis not present

## 2012-02-20 DIAGNOSIS — E669 Obesity, unspecified: Secondary | ICD-10-CM | POA: Diagnosis present

## 2012-02-20 DIAGNOSIS — I2582 Chronic total occlusion of coronary artery: Secondary | ICD-10-CM | POA: Diagnosis not present

## 2012-02-20 DIAGNOSIS — Z886 Allergy status to analgesic agent status: Secondary | ICD-10-CM

## 2012-02-20 DIAGNOSIS — J4489 Other specified chronic obstructive pulmonary disease: Secondary | ICD-10-CM | POA: Diagnosis present

## 2012-02-20 DIAGNOSIS — Z981 Arthrodesis status: Secondary | ICD-10-CM | POA: Diagnosis not present

## 2012-02-20 DIAGNOSIS — F3289 Other specified depressive episodes: Secondary | ICD-10-CM | POA: Diagnosis present

## 2012-02-20 DIAGNOSIS — Z7982 Long term (current) use of aspirin: Secondary | ICD-10-CM

## 2012-02-20 DIAGNOSIS — Z9861 Coronary angioplasty status: Secondary | ICD-10-CM

## 2012-02-20 DIAGNOSIS — I10 Essential (primary) hypertension: Secondary | ICD-10-CM | POA: Diagnosis present

## 2012-02-20 DIAGNOSIS — Z880 Allergy status to penicillin: Secondary | ICD-10-CM

## 2012-02-20 DIAGNOSIS — Z87891 Personal history of nicotine dependence: Secondary | ICD-10-CM

## 2012-02-20 DIAGNOSIS — J449 Chronic obstructive pulmonary disease, unspecified: Secondary | ICD-10-CM | POA: Diagnosis present

## 2012-02-20 DIAGNOSIS — Z888 Allergy status to other drugs, medicaments and biological substances status: Secondary | ICD-10-CM

## 2012-02-20 HISTORY — PX: HARDWARE REMOVAL: SHX979

## 2012-02-20 LAB — GRAM STAIN: Gram Stain: NONE SEEN

## 2012-02-20 LAB — GLUCOSE, CAPILLARY
Glucose-Capillary: 155 mg/dL — ABNORMAL HIGH (ref 70–99)
Glucose-Capillary: 110 mg/dL — ABNORMAL HIGH (ref 70–99)

## 2012-02-20 SURGERY — REMOVAL, HARDWARE
Anesthesia: General | Site: Spine Thoracic | Wound class: Clean

## 2012-02-20 MED ORDER — VANCOMYCIN HCL IN DEXTROSE 1-5 GM/200ML-% IV SOLN
1000.0000 mg | Freq: Once | INTRAVENOUS | Status: AC
  Administered 2012-02-20: 1000 mg via INTRAVENOUS
  Filled 2012-02-20: qty 200

## 2012-02-20 MED ORDER — NEOSTIGMINE METHYLSULFATE 1 MG/ML IJ SOLN
INTRAMUSCULAR | Status: DC | PRN
  Administered 2012-02-20: 4 mg via INTRAVENOUS

## 2012-02-20 MED ORDER — ONDANSETRON HCL 4 MG/2ML IJ SOLN: 4.0000 mg | INTRAMUSCULAR | Status: DC | PRN

## 2012-02-20 MED ORDER — FLUTICASONE-SALMETEROL 100-50 MCG/DOSE IN AEPB
1.0000 | INHALATION_SPRAY | Freq: Two times a day (BID) | RESPIRATORY_TRACT | Status: DC
  Filled 2012-02-20: qty 14

## 2012-02-20 MED ORDER — MENTHOL 3 MG MT LOZG
1.0000 | LOZENGE | OROMUCOSAL | Status: DC | PRN
  Administered 2012-02-20: 3 mg via ORAL
  Filled 2012-02-20: qty 9

## 2012-02-20 MED ORDER — PHENOL 1.4 % MT LIQD
1.0000 | OROMUCOSAL | Status: DC | PRN
  Filled 2012-02-20: qty 177

## 2012-02-20 MED ORDER — MIDAZOLAM HCL 5 MG/5ML IJ SOLN
INTRAMUSCULAR | Status: DC | PRN
  Administered 2012-02-20: 2 mg via INTRAVENOUS

## 2012-02-20 MED ORDER — PROPOFOL 10 MG/ML IV EMUL
INTRAVENOUS | Status: DC | PRN
  Administered 2012-02-20: 120 mg via INTRAVENOUS

## 2012-02-20 MED ORDER — VANCOMYCIN HCL IN DEXTROSE 1-5 GM/200ML-% IV SOLN
INTRAVENOUS | Status: AC
  Filled 2012-02-20: qty 200

## 2012-02-20 MED ORDER — LACTATED RINGERS IV SOLN
INTRAVENOUS | Status: DC | PRN
  Administered 2012-02-20: 10:00:00 via INTRAVENOUS

## 2012-02-20 MED ORDER — ACETAMINOPHEN 500 MG PO TABS: 500.0000 mg | ORAL_TABLET | ORAL | Status: DC | PRN

## 2012-02-20 MED ORDER — MONTELUKAST SODIUM 10 MG PO TABS
10.0000 mg | ORAL_TABLET | Freq: Every day | ORAL | Status: DC
  Administered 2012-02-20: 10 mg via ORAL
  Filled 2012-02-20 (×2): qty 1

## 2012-02-20 MED ORDER — MAGNESIUM 500 MG PO TABS
500.0000 mg | ORAL_TABLET | Freq: Two times a day (BID) | ORAL | Status: DC
Start: 2012-02-20 — End: 2012-02-20

## 2012-02-20 MED ORDER — DIPHENHYDRAMINE HCL (SLEEP) 25 MG PO TABS: 25.0000 mg | ORAL_TABLET | ORAL | Status: DC | PRN

## 2012-02-20 MED ORDER — ASPIRIN EC 81 MG PO TBEC
81.0000 mg | DELAYED_RELEASE_TABLET | Freq: Every day | ORAL | Status: DC
  Filled 2012-02-20: qty 1

## 2012-02-20 MED ORDER — 0.9 % SODIUM CHLORIDE (POUR BTL) OPTIME
TOPICAL | Status: DC | PRN
  Administered 2012-02-20: 1000 mL

## 2012-02-20 MED ORDER — SODIUM CHLORIDE 0.9 % IV SOLN: 250.0000 mL | INTRAVENOUS | Status: DC

## 2012-02-20 MED ORDER — BACITRACIN 50000 UNITS IM SOLR
INTRAMUSCULAR | Status: AC
  Filled 2012-02-20: qty 1

## 2012-02-20 MED ORDER — SODIUM CHLORIDE 0.9 % IJ SOLN
3.0000 mL | Freq: Two times a day (BID) | INTRAMUSCULAR | Status: DC
  Administered 2012-02-20: 3 mL via INTRAVENOUS

## 2012-02-20 MED ORDER — EZETIMIBE 10 MG PO TABS
10.0000 mg | ORAL_TABLET | Freq: Every day | ORAL | Status: DC
  Administered 2012-02-20: 10 mg via ORAL
  Filled 2012-02-20 (×2): qty 1

## 2012-02-20 MED ORDER — EPHEDRINE SULFATE 50 MG/ML IJ SOLN
INTRAMUSCULAR | Status: DC | PRN
  Administered 2012-02-20 (×2): 10 mg via INTRAVENOUS

## 2012-02-20 MED ORDER — PREGABALIN 50 MG PO CAPS
75.0000 mg | ORAL_CAPSULE | Freq: Every day | ORAL | Status: DC
  Administered 2012-02-20: 75 mg via ORAL
  Filled 2012-02-20: qty 3

## 2012-02-20 MED ORDER — THROMBIN 5000 UNITS EX KIT
PACK | CUTANEOUS | Status: DC | PRN
  Administered 2012-02-20 (×2): 5000 [IU] via TOPICAL

## 2012-02-20 MED ORDER — SODIUM CHLORIDE 0.9 % IR SOLN
Status: DC | PRN
  Administered 2012-02-20: 12:00:00

## 2012-02-20 MED ORDER — ALUM & MAG HYDROXIDE-SIMETH 200-200-20 MG/5ML PO SUSP: 30.0000 mL | Freq: Four times a day (QID) | ORAL | Status: DC | PRN

## 2012-02-20 MED ORDER — SODIUM CHLORIDE 0.9 % IV SOLN
INTRAVENOUS | Status: AC
  Filled 2012-02-20: qty 500

## 2012-02-20 MED ORDER — FLUCONAZOLE 150 MG PO TABS
150.0000 mg | ORAL_TABLET | ORAL | Status: DC
  Filled 2012-02-20: qty 1

## 2012-02-20 MED ORDER — CEFAZOLIN SODIUM 1-5 GM-% IV SOLN: 1.0000 g | Freq: Three times a day (TID) | INTRAVENOUS | Status: DC

## 2012-02-20 MED ORDER — HYDROMORPHONE HCL PF 1 MG/ML IJ SOLN: 0.5000 mg | INTRAMUSCULAR | Status: DC | PRN

## 2012-02-20 MED ORDER — ONDANSETRON HCL 4 MG/2ML IJ SOLN
INTRAMUSCULAR | Status: DC | PRN
  Administered 2012-02-20: 4 mg via INTRAVENOUS

## 2012-02-20 MED ORDER — LIDOCAINE HCL (CARDIAC) 20 MG/ML IV SOLN
INTRAVENOUS | Status: DC | PRN
  Administered 2012-02-20: 50 mg via INTRAVENOUS

## 2012-02-20 MED ORDER — SODIUM CHLORIDE 0.9 % IV SOLN
1000.0000 mg | INTRAVENOUS | Status: DC | PRN
  Administered 2012-02-20: 1000 mg via INTRAVENOUS

## 2012-02-20 MED ORDER — ROCURONIUM BROMIDE 100 MG/10ML IV SOLN
INTRAVENOUS | Status: DC | PRN
  Administered 2012-02-20: 50 mg via INTRAVENOUS

## 2012-02-20 MED ORDER — LIDOCAINE HCL 4 % MT SOLN
OROMUCOSAL | Status: DC | PRN
  Administered 2012-02-20: 4 mL via TOPICAL

## 2012-02-20 MED ORDER — OXYCODONE HCL 5 MG PO TABS: 5.0000 mg | ORAL_TABLET | Freq: Once | ORAL | Status: DC | PRN

## 2012-02-20 MED ORDER — ACETAMINOPHEN 650 MG RE SUPP: 650.0000 mg | RECTAL | Status: DC | PRN

## 2012-02-20 MED ORDER — HEMOSTATIC AGENTS (NO CHARGE) OPTIME
TOPICAL | Status: DC | PRN
  Administered 2012-02-20: 1 via TOPICAL

## 2012-02-20 MED ORDER — ACETAMINOPHEN 325 MG PO TABS: 650.0000 mg | ORAL_TABLET | ORAL | Status: DC | PRN

## 2012-02-20 MED ORDER — METOPROLOL SUCCINATE ER 100 MG PO TB24
100.0000 mg | ORAL_TABLET | Freq: Every day | ORAL | Status: DC
  Administered 2012-02-21: 100 mg via ORAL
  Filled 2012-02-20 (×2): qty 1

## 2012-02-20 MED ORDER — SODIUM CHLORIDE 0.9 % IJ SOLN: 3.0000 mL | INTRAMUSCULAR | Status: DC | PRN

## 2012-02-20 MED ORDER — CLOPIDOGREL BISULFATE 75 MG PO TABS
75.0000 mg | ORAL_TABLET | Freq: Every day | ORAL | Status: DC
  Administered 2012-02-21: 75 mg via ORAL
  Filled 2012-02-20 (×2): qty 1

## 2012-02-20 MED ORDER — ALPRAZOLAM 0.25 MG PO TABS: 0.1250 mg | ORAL_TABLET | Freq: Two times a day (BID) | ORAL | Status: DC | PRN

## 2012-02-20 MED ORDER — CYCLOBENZAPRINE HCL 10 MG PO TABS: 10.0000 mg | ORAL_TABLET | Freq: Three times a day (TID) | ORAL | Status: DC | PRN

## 2012-02-20 MED ORDER — FENTANYL CITRATE 0.05 MG/ML IJ SOLN
INTRAMUSCULAR | Status: DC | PRN
  Administered 2012-02-20: 50 ug via INTRAVENOUS
  Administered 2012-02-20 (×2): 100 ug via INTRAVENOUS

## 2012-02-20 MED ORDER — GLYCOPYRROLATE 0.2 MG/ML IJ SOLN
INTRAMUSCULAR | Status: DC | PRN
  Administered 2012-02-20: .5 mg via INTRAVENOUS

## 2012-02-20 MED ORDER — DIPHENHYDRAMINE HCL 25 MG PO CAPS: 25.0000 mg | ORAL_CAPSULE | ORAL | Status: DC | PRN

## 2012-02-20 MED ORDER — MAGNESIUM OXIDE 400 (241.3 MG) MG PO TABS
400.0000 mg | ORAL_TABLET | Freq: Two times a day (BID) | ORAL | Status: DC
  Administered 2012-02-20: 400 mg via ORAL
  Filled 2012-02-20 (×3): qty 1

## 2012-02-20 MED ORDER — NITROGLYCERIN 0.4 MG SL SUBL: 0.4000 mg | SUBLINGUAL_TABLET | SUBLINGUAL | Status: DC | PRN

## 2012-02-20 MED ORDER — HYDROMORPHONE HCL PF 1 MG/ML IJ SOLN: 0.2500 mg | INTRAMUSCULAR | Status: DC | PRN

## 2012-02-20 MED ORDER — OXYCODONE HCL 5 MG/5ML PO SOLN: 5.0000 mg | Freq: Once | ORAL | Status: DC | PRN

## 2012-02-20 MED ORDER — OXYCODONE-ACETAMINOPHEN 5-325 MG PO TABS
1.0000 | ORAL_TABLET | ORAL | Status: DC | PRN
  Administered 2012-02-20 – 2012-02-21 (×3): 1 via ORAL
  Filled 2012-02-20 (×3): qty 1

## 2012-02-20 SURGICAL SUPPLY — 53 items
BAG DECANTER FOR FLEXI CONT (MISCELLANEOUS) ×2 IMPLANT
BENZOIN TINCTURE PRP APPL 2/3 (GAUZE/BANDAGES/DRESSINGS) ×2 IMPLANT
BLADE SURG ROTATE 9660 (MISCELLANEOUS) IMPLANT
BRUSH SCRUB EZ PLAIN DRY (MISCELLANEOUS) ×2 IMPLANT
BUR MATCHSTICK NEURO 3.0 LAGG (BURR) ×2 IMPLANT
BUR PRECISION FLUTE 6.0 (BURR) ×2 IMPLANT
CANISTER SUCTION 2500CC (MISCELLANEOUS) ×2 IMPLANT
CLOTH BEACON ORANGE TIMEOUT ST (SAFETY) ×2 IMPLANT
CONT SPEC 4OZ CLIKSEAL STRL BL (MISCELLANEOUS) ×6 IMPLANT
DECANTER SPIKE VIAL GLASS SM (MISCELLANEOUS) ×2 IMPLANT
DERMABOND ADHESIVE PROPEN (GAUZE/BANDAGES/DRESSINGS) ×1
DERMABOND ADVANCED (GAUZE/BANDAGES/DRESSINGS)
DERMABOND ADVANCED .7 DNX12 (GAUZE/BANDAGES/DRESSINGS) IMPLANT
DERMABOND ADVANCED .7 DNX6 (GAUZE/BANDAGES/DRESSINGS) ×1 IMPLANT
DRAPE LAPAROTOMY 100X72X124 (DRAPES) ×2 IMPLANT
DRAPE POUCH INSTRU U-SHP 10X18 (DRAPES) ×2 IMPLANT
DRAPE PROXIMA HALF (DRAPES) IMPLANT
DRAPE SURG 17X23 STRL (DRAPES) IMPLANT
DRSG OPSITE 4X5.5 SM (GAUZE/BANDAGES/DRESSINGS) ×4 IMPLANT
ELECT REM PT RETURN 9FT ADLT (ELECTROSURGICAL) ×2
ELECTRODE REM PT RTRN 9FT ADLT (ELECTROSURGICAL) ×1 IMPLANT
GAUZE SPONGE 4X4 16PLY XRAY LF (GAUZE/BANDAGES/DRESSINGS) IMPLANT
GLOVE BIO SURGEON STRL SZ8 (GLOVE) ×2 IMPLANT
GLOVE BIOGEL PI IND STRL 7.0 (GLOVE) ×1 IMPLANT
GLOVE BIOGEL PI INDICATOR 7.0 (GLOVE) ×1
GLOVE EXAM NITRILE LRG STRL (GLOVE) IMPLANT
GLOVE EXAM NITRILE MD LF STRL (GLOVE) ×2 IMPLANT
GLOVE EXAM NITRILE XL STR (GLOVE) IMPLANT
GLOVE EXAM NITRILE XS STR PU (GLOVE) IMPLANT
GLOVE INDICATOR 8.5 STRL (GLOVE) ×2 IMPLANT
GLOVE SURG SS PI 6.5 STRL IVOR (GLOVE) ×4 IMPLANT
GOWN BRE IMP SLV AUR LG STRL (GOWN DISPOSABLE) ×2 IMPLANT
GOWN BRE IMP SLV AUR XL STRL (GOWN DISPOSABLE) ×2 IMPLANT
GOWN STRL REIN 2XL LVL4 (GOWN DISPOSABLE) IMPLANT
KIT BASIN OR (CUSTOM PROCEDURE TRAY) ×2 IMPLANT
KIT ROOM TURNOVER OR (KITS) ×2 IMPLANT
NEEDLE HYPO 22GX1.5 SAFETY (NEEDLE) ×2 IMPLANT
NS IRRIG 1000ML POUR BTL (IV SOLUTION) ×2 IMPLANT
PACK LAMINECTOMY NEURO (CUSTOM PROCEDURE TRAY) ×2 IMPLANT
RASP 3.0MM (RASP) ×2 IMPLANT
SPONGE GAUZE 4X4 12PLY (GAUZE/BANDAGES/DRESSINGS) ×2 IMPLANT
SPONGE SURGIFOAM ABS GEL SZ50 (HEMOSTASIS) ×2 IMPLANT
STRIP CLOSURE SKIN 1/2X4 (GAUZE/BANDAGES/DRESSINGS) ×4 IMPLANT
SUT VIC AB 0 CT1 18XCR BRD8 (SUTURE) ×1 IMPLANT
SUT VIC AB 0 CT1 8-18 (SUTURE) ×1
SUT VIC AB 2-0 CT1 18 (SUTURE) ×4 IMPLANT
SUT VICRYL 4-0 PS2 18IN ABS (SUTURE) ×2 IMPLANT
SWAB COLLECTION DEVICE MRSA (MISCELLANEOUS) ×4 IMPLANT
SYR 20ML ECCENTRIC (SYRINGE) ×2 IMPLANT
TOWEL OR 17X24 6PK STRL BLUE (TOWEL DISPOSABLE) ×2 IMPLANT
TOWEL OR 17X26 10 PK STRL BLUE (TOWEL DISPOSABLE) ×2 IMPLANT
TUBE ANAEROBIC SPECIMEN COL (MISCELLANEOUS) ×4 IMPLANT
WATER STERILE IRR 1000ML POUR (IV SOLUTION) ×2 IMPLANT

## 2012-02-20 NOTE — H&P (Signed)
[name] is an 66 y.o. male.   Chief Complaint: Painful hardware HPI: Patient is a pleasant 66 year old gentleman who underwent previous cervical thoracic fusion many years ago for traumatic fracture over last several weeks and months of progressive swelling around the his surgical site with what appears to be displacement of his hardware with some soft tissue seromatous fluid around it. Workup has revealed a pulling out of the superior screws and fluid collecting around it but a solidly fused construct and intact on the contralateral side so I patient has requested and I agree to expiration of fusion removal of hardware and culture of the fluid collection Axis I of the wrist nevus of the operation with him as well as therapy course expectations about alternatives of surgery he understands and agrees to proceed forward.  Past Medical History  Diagnosis Date  . CAD (coronary artery disease)     pt last left heart cath was in Nov 2008. EF was 55% on left ventriculogram. Circumflex was totally occluded after the 1st obtuse marginal with collaterals supplying the distal circumflex. LAD showed luminal irregularities. The stent in LAD was patent. The RCA showed luminal irregularities. The stent in th emid RCA and PDA were patent. There were no inverventions.   . Obesity   . Low back pain     chronic  . GERD (gastroesophageal reflux disease)     hiatal hernia. Patient did hav ea Nissen fundoplication  . Peptic ulcer disease   . DM2 (diabetes mellitus, type 2)     well controlled  . Spinal fracture     hx of traumatic in Jan 2009 after falling off the roof  . Chronic obstructive pulmonary disease     asthma  . Benign prostatic hypertrophy   . Osteoarthritis   . Anxiety     treated /w Xanax for mild depression , using 2 times per day, not PRN  . Depression   . Asthma   . Sleep concern     states the study (2003 )was done at [facility]., it failed & he was told to return & he never did.  Pt. states he has been found by prev. hosp. staff that he has to be told when to breathe & his wife does the same.   . Cluster headache     relative to sinus problems   . Cluster headache   . History of blood transfusion     need for Bld. transfusion relative to taking NSAIDS  . Cancer     basal cell- face & head  . 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 HTN (hypertension)     pt. followed by [facility], last cardiac visit 2012, one yr. ago    Past Surgical History  Procedure Date  . Hiatal hernia repair   . Lumbar spine surgery     x4  . Right temporal artery biopsy   . C5-t6 posterior fusion     with Oasis and radius screws  . Right carpal tunnel release   . Esophageal manometry   . Laparoscopic cholecystectomy     & IOC  . Umbilical hernia repair   . Multiple percutaneous coronary interventions   . Back surgery     x3- last surgery lumbar- 1998- / fusion   . Cataracts     cataracts removed- /w IOL - both eyes   . Blepheroplasty  both eyes  . Eye surgery   . Nasal sinus surgery     x2, sees Dr. Suszanne Conners, still having problems, states he uses Benadryl PRN- up to 5 times per day   . Cardiac catheterization     2008 & 2012 multiple stents      Family History  Problem Relation Age of Onset  . Heart failure Mother     in her 56s  . Coronary artery disease Father     developed in his 73s  . Heart attack Brother     in there 60s  . Heart attack Brother     in there 46s   Social History:  reports that he quit smoking about 34 years ago. He does not have any smokeless tobacco history on file. He reports that he drinks alcohol. He reports that he does not use illicit drugs.  Allergies:  Allergies  Allergen Reactions  . Adhesive (Tape)     SKIN PEELS  . Codeine Itching    Also hallucinations   . Morphine And Related Other (See Comments)    Hallucinations  . Nsaids Other (See Comments)    BLEEDING    . Penicillins Rash    Medications Prior to Admission  Medication Sig Dispense Refill  . acetaminophen (TYLENOL) 500 MG tablet Take 500 mg by mouth every 4 (four) hours as needed.      . ALPRAZolam (XANAX) 0.25 MG tablet Take 0.125 mg by mouth 2 (two) times daily as needed. For anxiety.      Marland Kitchen aspirin EC 81 MG tablet Take 81 mg by mouth daily.      . B Complex-Folic Acid (B COMPLEX-VITAMIN B12 PO) Take 1 tablet by mouth daily.       . clopidogrel (PLAVIX) 75 MG tablet Take 75 mg by mouth daily after breakfast.       . Coenzyme Q10 (CO Q 10) 100 MG CAPS Take 100 mg by mouth every other day.       . diphenhydrAMINE (SOMINEX) 25 MG tablet Take 25 mg by mouth every 4 (four) hours as needed.      . ezetimibe (ZETIA) 10 MG tablet Take 10 mg by mouth at bedtime.       . fish oil-omega-3 fatty acids 1000 MG capsule Take 2 g by mouth daily.       . Flaxseed, Linseed, (FLAX SEED OIL) 1000 MG CAPS Take 1,000 mg by mouth 2 (two) times daily.       . fluconazole (DIFLUCAN) 150 MG tablet Take 150 mg by mouth every Tuesday.       . Fluticasone-Salmeterol (ADVAIR) 100-50 MCG/DOSE AEPB Inhale 1 puff into the lungs every 12 (twelve) hours.      Marland Kitchen GARLIC PO Take 1 capsule by mouth daily.      . Magnesium 500 MG TABS Take 500 mg by mouth 2 (two) times daily.      . Melatonin 3 MG CAPS Take 10 mg by mouth at bedtime.       . metoprolol succinate (TOPROL-XL) 100 MG 24 hr tablet Take 100 mg by mouth daily after breakfast.       . montelukast (SINGULAIR) 10 MG tablet Take 10 mg by mouth at bedtime.       . Multiple Vitamin (MULTIVITAMIN) capsule Take 1 capsule by mouth daily.        . nitroGLYCERIN (NITROSTAT) 0.4 MG SL tablet Place 0.4 mg under the tongue every 5 (five) minutes as needed. For  chest pain.      . pantoprazole (PROTONIX) 40 MG tablet Take 40 mg by mouth daily as needed. For acid reflux.      . pregabalin (LYRICA) 75 MG capsule Take 75 mg by mouth daily after lunch.       . Turmeric 500 MG CAPS Take  500 mg by mouth daily.       . Valerian 400 MG CAPS Take 400 mg by mouth at bedtime.         Results for orders placed during the hospital encounter of 02/20/12 (from the past 48 hour(s))  GLUCOSE, CAPILLARY     Status: Normal   Collection Time   02/20/12  8:42 AM      Component Value Range Comment   Glucose-Capillary 80  70 - 99 mg/dL    No results found.  Review of Systems  Constitutional: Negative.   HENT: Positive for neck pain.   Eyes: Negative.   Respiratory: Negative.   Cardiovascular: Negative.   Gastrointestinal: Negative.   Genitourinary: Negative.   Musculoskeletal: Positive for back pain.  Skin: Negative.   Neurological: Negative.   Endo/Heme/Allergies: Negative.   Psychiatric/Behavioral: Negative.     Blood pressure 136/81, pulse 63, temperature 97.8 F (36.6 C), temperature source Oral, resp. rate 18, SpO2 96.00%. Physical Exam  Constitutional: He is oriented to person, place, and time. He appears well-developed and well-nourished.  HENT:  Head: Normocephalic and atraumatic.  Eyes: Pupils are equal, round, and reactive to light.  Respiratory: Effort normal and breath sounds normal.  GI: Soft.  Musculoskeletal: Normal range of motion. He exhibits edema and tenderness.  Neurological: He is alert and oriented to person, place, and time. He has normal strength. He displays a negative Romberg sign. GCS eye subscore is 4. GCS verbal subscore is 5. GCS motor subscore is 6.       Patient strength is 5 out of 5 in his deltoids, biceps, triceps, wrist flexion wrist extension and intrinsics.     Assessment/Plan 66 year old presents for hardware removal.  Makaio Mach P 02/20/2012, 10:02 AM

## 2012-02-20 NOTE — Anesthesia Postprocedure Evaluation (Signed)
  Anesthesia Post-op Note  Patient: [name] [facility]  Procedure(s) Performed: Procedure(s) (LRB) with comments: HARDWARE REMOVAL (N/A) - Hardware Removal  Patient Location: PACU  Anesthesia Type: General  Level of Consciousness: awake, alert  and oriented  Airway and Oxygen Therapy: Patient Spontanous Breathing and Patient connected to nasal cannula oxygen  Post-op Pain: none  Post-op Assessment: Post-op Vital signs reviewed, Patient's Cardiovascular Status Stable, Respiratory Function Stable, Patent Airway and No signs of Nausea or vomiting  Post-op Vital Signs: Reviewed and stable  Complications: No apparent anesthesia complications

## 2012-02-20 NOTE — Anesthesia Procedure Notes (Signed)
Procedure Name: Intubation Date/Time: 02/20/2012 10:36 AM Performed by: Elmyra Banwart S Pre-anesthesia Checklist: Patient identified, Emergency Drugs available, Suction available, Patient being monitored and Timeout performed Patient Re-evaluated:Patient Re-evaluated prior to inductionOxygen Delivery Method: Circle system utilized Preoxygenation: Pre-oxygenation with 100% oxygen Intubation Type: IV induction Ventilation: Mask ventilation without difficulty Laryngoscope Size: Mac and 4 Grade View: Grade I Tube type: Oral Tube size: 8.0 mm Number of attempts: 1 Airway Equipment and Method: Stylet Placement Confirmation: ETT inserted through vocal cords under direct vision,  positive ETCO2 and breath sounds checked- equal and bilateral Secured at: 22 cm Dental Injury: Teeth and Oropharynx as per pre-operative assessment

## 2012-02-20 NOTE — Progress Notes (Signed)
Call from lab with gram stain results on thoracic tissue - no organisms and no wbcs and gram stain results on hardware removed - rare polys and monos and no organisms.  Report given to ashley rn on 3500 who is caring for patients at 1330 on 02/20/12

## 2012-02-20 NOTE — Anesthesia Preprocedure Evaluation (Addendum)
Anesthesia Evaluation  Patient identified by MRN, date of birth, ID band Patient awake    Reviewed: Allergy & Precautions, H&P , NPO status , Patient's Chart, lab work & pertinent test results, reviewed documented beta blocker date and time   Airway Mallampati: II TM Distance: >3 FB Neck ROM: Full    Dental No notable dental hx. (+) Edentulous Upper, Partial Lower and Dental Advisory Given   Pulmonary asthma , COPD breath sounds clear to auscultation  Pulmonary exam normal       Cardiovascular hypertension, On Medications and On Home Beta Blockers + CAD and + Cardiac Stents Rhythm:Regular Rate:Normal     Neuro/Psych  Headaches, PSYCHIATRIC DISORDERS Anxiety Depression  Neuromuscular disease    GI/Hepatic Neg liver ROS, PUD, GERD-  Medicated,  Endo/Other  diabetes, Well Controlled, Type 2  Renal/GU negative Renal ROS  negative genitourinary   Musculoskeletal  (+) Arthritis -, Osteoarthritis,    Abdominal   Peds  Hematology negative hematology ROS (+)   Anesthesia Other Findings   Reproductive/Obstetrics negative OB ROS                          Anesthesia Physical Anesthesia Plan  ASA: III  Anesthesia Plan: General   Post-op Pain Management:    Induction: Intravenous  Airway Management Planned: Oral ETT  Additional Equipment:   Intra-op Plan:   Post-operative Plan: Extubation in OR  Informed Consent: I have reviewed the patients History and Physical, chart, labs and discussed the procedure including the risks, benefits and alternatives for the proposed anesthesia with the patient or authorized representative who has indicated his/her understanding and acceptance.   Dental advisory given  Plan Discussed with: CRNA  Anesthesia Plan Comments:         Anesthesia Quick Evaluation

## 2012-02-20 NOTE — Op Note (Signed)
Preoperative diagnosis: Painful hardware possible infected hardware, possible pseudoarthrosis  Postoperative diagnosis: Painful hardware pseudoarthrosis  Procedure: Expiration of fusion removal of hardware on the left of the thoracic component of his previous cervical thoracic fusion  Surgeon: Jillyn Hidden Davonta Stroot  Anesthesia: Gen.  EBL: Minimal  History of present illness: Patient was a 66 year old gentleman has undergone previous cervical thoracic fusion many years ago as developed a swelling and painful on his placed in left side of his upper thoracic wound consistent with backing out and displacement of his hardware. Workup revealed a pseudoarthrosis on the left of the thoracic component cervical thoracic fusion with displaced hardware. The patient had excellent fluid buildup around it and was palpable on exam. This is felt to represent a pseudarthrosis with possible infection the patient recommended expiration of fusion removal of hardware on the left of the thoracic component of the cervical thoracic fusion.  Operative procedure: Patient brought into the or was induced under general anesthesia positioned prone in pins his cervical thoracic incision was exposed and prepped and draped in routine sterile fashion the swollen area on the left side of his thoracic incision was immediately identified within the subcutaneous tissues and after infiltration 10 cc lidocaine with epi and incision was made the subcutaneous tissues dissected free the are was immediately palpable so this was dissected free on this hardware was noted to be hypermobile and pseudoarthrosis. The additional thoracic screws were exposed and dissection was carried up cephalad to the connector to the Oasis cervical set. 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 So after copious irrigation with bacitracin saline and multiple cultures both fluid the specimen as well as the hardware was then closed and reapproximated after Vicryl in the soft tissues also closing the Vicryl the skin was closed running 4 subcuticular all the additional fusion both the cervical I could see on the right and the left appeared solid. At the end of the case on it counts and sponge counts were correct.

## 2012-02-20 NOTE — Transfer of Care (Signed)
Immediate Anesthesia Transfer of Care Note  Patient: [name] [facility]  Procedure(s) Performed: Procedure(s) (LRB) with comments: HARDWARE REMOVAL (N/A)  Patient Location: PACU  Anesthesia Type: General  Level of Consciousness: awake, alert  and oriented  Airway & Oxygen Therapy: Patient Spontanous Breathing and Patient connected to nasal cannula oxygen  Post-op Assessment: Report given to PACU RN and Post -op Vital signs reviewed and stable  Post vital signs: Reviewed and stable  Complications: No apparent anesthesia complications

## 2012-02-20 NOTE — Progress Notes (Signed)
Pt. With telpha and opsite dressing.

## 2012-02-20 NOTE — Preoperative (Signed)
Beta Blockers   Reason not to administer Beta Blockers:Not Applicable 

## 2012-02-21 ENCOUNTER — Encounter (HOSPITAL_COMMUNITY): Payer: Self-pay | Admitting: Neurosurgery

## 2012-02-21 DIAGNOSIS — Z7902 Long term (current) use of antithrombotics/antiplatelets: Secondary | ICD-10-CM | POA: Diagnosis not present

## 2012-02-21 DIAGNOSIS — Z8249 Family history of ischemic heart disease and other diseases of the circulatory system: Secondary | ICD-10-CM | POA: Diagnosis not present

## 2012-02-21 DIAGNOSIS — J449 Chronic obstructive pulmonary disease, unspecified: Secondary | ICD-10-CM | POA: Diagnosis present

## 2012-02-21 DIAGNOSIS — Z888 Allergy status to other drugs, medicaments and biological substances status: Secondary | ICD-10-CM | POA: Diagnosis not present

## 2012-02-21 DIAGNOSIS — E119 Type 2 diabetes mellitus without complications: Secondary | ICD-10-CM | POA: Diagnosis present

## 2012-02-21 DIAGNOSIS — K219 Gastro-esophageal reflux disease without esophagitis: Secondary | ICD-10-CM | POA: Diagnosis present

## 2012-02-21 DIAGNOSIS — I10 Essential (primary) hypertension: Secondary | ICD-10-CM | POA: Diagnosis present

## 2012-02-21 DIAGNOSIS — Z7982 Long term (current) use of aspirin: Secondary | ICD-10-CM | POA: Diagnosis not present

## 2012-02-21 DIAGNOSIS — F3289 Other specified depressive episodes: Secondary | ICD-10-CM | POA: Diagnosis present

## 2012-02-21 DIAGNOSIS — Z79899 Other long term (current) drug therapy: Secondary | ICD-10-CM | POA: Diagnosis not present

## 2012-02-21 DIAGNOSIS — F411 Generalized anxiety disorder: Secondary | ICD-10-CM | POA: Diagnosis present

## 2012-02-21 DIAGNOSIS — T84498A Other mechanical complication of other internal orthopedic devices, implants and grafts, initial encounter: Secondary | ICD-10-CM | POA: Diagnosis present

## 2012-02-21 DIAGNOSIS — I2582 Chronic total occlusion of coronary artery: Secondary | ICD-10-CM | POA: Diagnosis present

## 2012-02-21 DIAGNOSIS — I2510 Atherosclerotic heart disease of native coronary artery without angina pectoris: Secondary | ICD-10-CM | POA: Diagnosis present

## 2012-02-21 DIAGNOSIS — Z9861 Coronary angioplasty status: Secondary | ICD-10-CM | POA: Diagnosis not present

## 2012-02-21 DIAGNOSIS — Z880 Allergy status to penicillin: Secondary | ICD-10-CM | POA: Diagnosis not present

## 2012-02-21 DIAGNOSIS — Z87891 Personal history of nicotine dependence: Secondary | ICD-10-CM | POA: Diagnosis not present

## 2012-02-21 DIAGNOSIS — Z886 Allergy status to analgesic agent status: Secondary | ICD-10-CM | POA: Diagnosis not present

## 2012-02-21 DIAGNOSIS — E669 Obesity, unspecified: Secondary | ICD-10-CM | POA: Diagnosis present

## 2012-02-21 LAB — GLUCOSE, CAPILLARY

## 2012-02-21 MED ORDER — CYCLOBENZAPRINE HCL 10 MG PO TABS: 10.0000 mg | ORAL_TABLET | Freq: Three times a day (TID) | ORAL | Status: AC | PRN

## 2012-02-21 MED ORDER — OXYCODONE-ACETAMINOPHEN 5-325 MG PO TABS: 1.0000 | ORAL_TABLET | ORAL | Status: AC | PRN

## 2012-02-21 NOTE — Progress Notes (Signed)
Pt and wife given D/C instructions with rx's, verbal understanding given. Pt D/C'd home via wheelchair @ [phone] per MD order. Rema Fendt, RN

## 2012-02-21 NOTE — Progress Notes (Signed)
Subjective: Patient reports Is feeling well he is very minimal pain  Objective: Vital signs in last 24 hours: Temp:  [97.4 F (36.3 C)-98.3 F (36.8 C)] 97.6 F (36.4 C) (09/10 0400) Pulse Rate:  [60-80] 61  (09/10 0400) Resp:  [16-27] 16  (09/10 0400) BP: (116-149)/(65-81) 116/65 mmHg (09/10 0400) SpO2:  [95 %-100 %] 95 % (09/10 0400)  Intake/Output from previous day: 09/09 0701 - 09/10 0700 In: 1960 [P.O.:960; I.V.:1000] Out: 105 [Drains:55; Blood:50] Intake/Output this shift:    GI: normal findings:  and    Lab Results: No results found for this basename: WBC:2,HGB:2,HCT:2,PLT:2 in the last 72 hours BMET No results found for this basename: NA:2,K:2,CL:2,CO2:2,GLUCOSE:2,BUN:2,CREATININE:2,CALCIUM:2 in the last 72 hours  Studies/Results: No results found.  Assessment/Plan: Discharge home  LOS: 1 day     Aundre Hietala P 02/21/2012, 7:50 AM

## 2012-02-21 NOTE — Discharge Summary (Signed)
Physician Discharge Summary  Patient ID: [name] MRN: [id number] DOB/AGE: [id number] y.o.  Admit date: 02/20/2012 Discharge date: 02/21/2012  Admission Diagnoses: Painful hardware and pseudoarthrosis  Discharge Diagnoses: Same Active Problems:  * No active hospital problems. *    Discharged Condition: good  Hospital Course: Patient was admitted hospital underwent hardware removal and are left in his thoracic screws postoperative patient did very well recovered in the floor on the floor patient convalesced well was angling and voiding spontaneously tolerating regular diet was he'll be discharged home stable condition.  Consults: Significant Diagnostic Studies: Treatments: Hardware removal Discharge Exam: Blood pressure 116/65, pulse 61, temperature 97.6 F (36.4 C), temperature source Oral, resp. rate 16, SpO2 95.00%. Strength out of 5 wound clean and dry  Disposition: Home   Medication List  As of 02/21/2012  7:52 AM   TAKE these medications         acetaminophen 500 MG tablet   Commonly known as: TYLENOL   Take 500 mg by mouth every 4 (four) hours as needed.      ALPRAZolam 0.25 MG tablet   Commonly known as: XANAX   Take 0.125 mg by mouth 2 (two) times daily as needed. For anxiety.      aspirin EC 81 MG tablet   Take 81 mg by mouth daily.      B COMPLEX-VITAMIN B12 PO   Take 1 tablet by mouth daily.      clopidogrel 75 MG tablet   Commonly known as: PLAVIX   Take 75 mg by mouth daily after breakfast.      Co Q 10 100 MG Caps   Take 100 mg by mouth every other day.      cyclobenzaprine 10 MG tablet   Commonly known as: FLEXERIL   Take 1 tablet (10 mg total) by mouth 3 (three) times daily as needed for muscle spasms.      diphenhydrAMINE 25 MG tablet   Commonly known as: SOMINEX   Take 25 mg by mouth every 4 (four) hours as needed.      ezetimibe 10 MG tablet   Commonly known as: ZETIA   Take 10 mg by mouth at bedtime.      fish oil-omega-3  fatty acids 1000 MG capsule   Take 2 g by mouth daily.      Flax Seed Oil 1000 MG Caps   Take 1,000 mg by mouth 2 (two) times daily.      fluconazole 150 MG tablet   Commonly known as: DIFLUCAN   Take 150 mg by mouth every Tuesday.      Fluticasone-Salmeterol 100-50 MCG/DOSE Aepb   Commonly known as: ADVAIR   Inhale 1 puff into the lungs every 12 (twelve) hours.      GARLIC PO   Take 1 capsule by mouth daily.      Magnesium 500 MG Tabs   Take 500 mg by mouth 2 (two) times daily.      Melatonin 3 MG Caps   Take 10 mg by mouth at bedtime.      metoprolol succinate 100 MG 24 hr tablet   Commonly known as: TOPROL-XL   Take 100 mg by mouth daily after breakfast.      montelukast 10 MG tablet   Commonly known as: SINGULAIR   Take 10 mg by mouth at bedtime.      multivitamin capsule   Take 1 capsule by mouth daily.      nitroGLYCERIN 0.4 MG  SL tablet   Commonly known as: NITROSTAT   Place 0.4 mg under the tongue every 5 (five) minutes as needed. For chest pain.      pantoprazole 40 MG tablet   Commonly known as: PROTONIX   Take 40 mg by mouth daily as needed. For acid reflux.      pregabalin 75 MG capsule   Commonly known as: LYRICA   Take 75 mg by mouth daily after lunch.      Turmeric 500 MG Caps   Take 500 mg by mouth daily.      Valerian 400 MG Caps   Take 400 mg by mouth at bedtime.             Signed: Daijanae Rafalski P 02/21/2012, 7:52 AM

## 2012-02-23 LAB — WOUND CULTURE

## 2012-02-24 LAB — WOUND CULTURE

## 2012-02-24 LAB — TISSUE CULTURE: Culture: NO GROWTH

## 2012-02-25 LAB — ANAEROBIC CULTURE

## 2012-03-28 ENCOUNTER — Ambulatory Visit (INDEPENDENT_AMBULATORY_CARE_PROVIDER_SITE_OTHER): Payer: Medicare Other | Admitting: Cardiology

## 2012-03-28 ENCOUNTER — Encounter: Payer: Self-pay | Admitting: Cardiology

## 2012-03-28 VITALS — BP 150/93 | HR 80 | Ht 67.0 in | Wt 199.0 lb

## 2012-03-28 DIAGNOSIS — E7800 Pure hypercholesterolemia, unspecified: Secondary | ICD-10-CM

## 2012-03-28 DIAGNOSIS — I10 Essential (primary) hypertension: Secondary | ICD-10-CM | POA: Diagnosis not present

## 2012-03-28 DIAGNOSIS — E785 Hyperlipidemia, unspecified: Secondary | ICD-10-CM | POA: Diagnosis not present

## 2012-03-28 DIAGNOSIS — I2510 Atherosclerotic heart disease of native coronary artery without angina pectoris: Secondary | ICD-10-CM

## 2012-03-28 NOTE — Progress Notes (Signed)
Patient ID: [name], male   DOB: 1946-04-16, 66 y.o.   MRN: [id number] PCP: Dr. Oneta Rack  66 yo with history of CAD, HTN, hyperlipidemia presents for followup.  He has had multiple stents in his LAD and RCA.  Last cath in 7/12 showed a totally occluded CFX (known from prior caths) with collaterals from the RCA.  There was no other significant coronary disease and EF was 55%.  He had back surgery to remove failed hardware in 9/13 with no complications.  He is planned to undergo surgery to correct ptosis soon.    Overall, he is doing well.  Back pain is better since back surgery.  No significant exertional dyspnea.  He walks for about 15 minutes in the morning and 15 minutes in the evening.  No chest pain.  He is no longer taking Niacin because he read that it did not do any good.   ECG (9/13): NSR, normal  Labs (7/10): creatinine 1.15, LDL 100, HDL 50, TGs 153 Labs (7/12): K 3.7, creatinine 0.9, LDL 42, HDL 64 Labs (9/13): K 4, creatinine 1.02  Allergies (verified):  1)  ! Pcn  Past Medical History: 1. Coronary artery disease.  Patient had LHC in November 2008.  EF was 55% on left ventriculogram.  The circumflex was totally occluded after the first obtuse marginal with collaterals supplying the distal circumflex.  The LAD showed luminal irregularities.  The stent in the LAD was patent.  The RCA showed luminal irregularities.  The stent in the mid RCA and PDA were patent.  There were no interventions.  Patient had LHC in 7/12 with no significant changes from 11/08.   2. Hyperlipidemia: has tried almost every statin and developed severe myalgias. 3. Obesity 4. Chronic low-back pain. 5. Gastroesophageal reflux disease/hiatal hernia.  The patient did have a Nissen fundoplication. 6. History of peptic ulcer disease. 7. Type 2 diabetes, this is well controlled. 8. Hypertension, this is well controlled. 9. History of traumatic spinal fracture in January 2009 after falling off the  roof. 10.Cluster headaches. 11.Chronic obstructive pulmonary disease/asthma. 12.Benign prostatic hypertrophy: s/p surgery.  13.Osteoarthritis. 14.Chronic sinusitis s/p surgery.   Family History: The patient's father had coronary artery disease developed in his 32s.  Mother had congestive heart failure.  She had a brother who developed congestive heart failure in his 11s.  She had 2 other brothers who had MIs in their 72s.     Social History: The patient is married.  He has 1 child.  He quit smoking in 1979.  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 He drinks alcohol rarely.   ROS: All systems reviewed and negative except as per HPI.      Current Outpatient Prescriptions  Medication Sig Dispense Refill  . acetaminophen (TYLENOL) 500 MG tablet Take 500 mg by mouth every 4 (four) hours as needed.      . ALPRAZolam (XANAX) 0.25 MG tablet Take 0.125 mg by mouth 2 (two) times daily as needed. For anxiety.      Marland Kitchen aspirin EC 81 MG tablet Take 81 mg by mouth daily.      . B Complex-Folic Acid (B COMPLEX-VITAMIN B12 PO) Take 1 tablet by mouth daily.       . clopidogrel (PLAVIX) 75 MG tablet Take 75 mg by mouth daily after breakfast.       . Coenzyme Q10 (CO Q 10) 100 MG CAPS Take 100 mg by mouth daily.       Marland Kitchen  diphenhydrAMINE (SOMINEX) 25 MG tablet Take 25 mg by mouth every 4 (four) hours as needed.      . ezetimibe (ZETIA) 10 MG tablet Take 10 mg by mouth at bedtime.       . fish oil-omega-3 fatty acids 1000 MG capsule Take 2 g by mouth daily.       . Flaxseed, Linseed, (FLAX SEED OIL) 1000 MG CAPS Take 1,000 mg by mouth 2 (two) times daily.       . fluconazole (DIFLUCAN) 150 MG tablet Take 150 mg by mouth every Tuesday.       . Fluticasone-Salmeterol (ADVAIR) 100-50 MCG/DOSE AEPB Inhale 1 puff into the lungs every 12 (twelve) hours.      Marland Kitchen GARLIC PO Take 1 capsule by mouth 2 (two) times daily.       . Magnesium 500 MG TABS Take 500 mg by  mouth 2 (two) times daily.      . Melatonin 3 MG CAPS Take 10 mg by mouth at bedtime.       . metoprolol succinate (TOPROL-XL) 100 MG 24 hr tablet Take 100 mg by mouth daily after breakfast.       . montelukast (SINGULAIR) 10 MG tablet Take 10 mg by mouth at bedtime.       . Multiple Vitamin (MULTIVITAMIN) capsule Take 1 capsule by mouth daily.        . nitroGLYCERIN (NITROSTAT) 0.4 MG SL tablet Place 0.4 mg under the tongue every 5 (five) minutes as needed. For chest pain.      . pantoprazole (PROTONIX) 40 MG tablet Take 40 mg by mouth daily as needed. For acid reflux.      . pregabalin (LYRICA) 75 MG capsule Take 75 mg by mouth daily after lunch.       . Turmeric 500 MG CAPS Take 500 mg by mouth daily.       . Valerian 400 MG CAPS Take 400 mg by mouth at bedtime.       . niacin (NIASPAN) 1000 MG CR tablet Take 1 tablet (1,000 mg total) by mouth at bedtime.        BP 150/93  Pulse 80  Ht 5\' 7"  (1.702 m)  Wt 199 lb (90.266 kg)  BMI 31.17 kg/m2 General: NAD, obese.  Neck: No JVD, no thyromegaly or thyroid nodule.  Lungs: Clear to auscultation bilaterally with normal respiratory effort. CV: Nondisplaced PMI.  Heart regular S1/S2, no S3/S4, no murmur.  No peripheral edema.  No carotid bruit.  Normal pedal pulses.  Abdomen: Soft, nontender, no hepatosplenomegaly, no distention.  Neurologic: Alert and oriented x 3.  Psych: Normal affect. Extremities: No clubbing or cyanosis.   Assessment/Plan: 1. CAD: Stable with no ischemic symptoms.  He will continue ASA 81, Plavix, and Toprol XL.  He is on Zetia.  He has been unable to tolerate statins.  He was on niacin in the past, and I would like him to restart it at 1000 mg daily.  I explained to him that the data showing no advantage to niacin was in the setting of statin use and he is not on a statin.  Niacin by itself has been shown to decrease coronary events (in patients not taking statins).  Check lipids/LFTs in 2 months after restarting Niacin.   2. HTN: BP high today.  I will have him check BP daily at home (has cuff).  If BP is running high, I will have him start an ACEI.   3. Hyperlipidemia: Restart  niacin as above.  4. Pre-operative evaluation: Patient can undergo ptosis surgery with no further workup.  He should continue Toprol XL peri-op.  He can stop Plavix prior to surgery and restart afterwards.   Marca Ancona 03/28/2012

## 2012-03-28 NOTE — Patient Instructions (Addendum)
Take Niacin 1000mg  daily.  Take and record your blood pressure daily. I will call you in 2 weeks to get the readings. Luana Shu [phone]  Your physician recommends that you return for a FASTING lipid profile /liver profile. Tuesday December 17,2013. Do not eat or drink after midnight. The lab opens at 8:30am. Your physician wants you to follow-up in: 1 year with Dr Shirlee Latch. (October 2014).  You will receive a reminder letter in the mail two months in advance. If you don't receive a letter, please call our office to schedule the follow-up appointment.

## 2012-04-02 DIAGNOSIS — J0100 Acute maxillary sinusitis, unspecified: Secondary | ICD-10-CM | POA: Diagnosis not present

## 2012-04-02 DIAGNOSIS — Z23 Encounter for immunization: Secondary | ICD-10-CM | POA: Diagnosis not present

## 2012-04-12 ENCOUNTER — Telehealth: Payer: Self-pay | Admitting: *Deleted

## 2012-04-12 NOTE — Telephone Encounter (Signed)
Would like to see what his BP is later in the day after he takes his meds.

## 2012-04-12 NOTE — Telephone Encounter (Signed)
Pt advised, verbalized understanding. He will call in 2 weeks with the readings.

## 2012-04-12 NOTE — Telephone Encounter (Signed)
Follow-up:    Patient returned your call.  Please call back. 

## 2012-04-12 NOTE — Telephone Encounter (Signed)
HTN: BP high today. I will have him check BP daily at home (has cuff). If BP is running high, I will have him start an ACEI.   04/12/12  LMTCB

## 2012-04-12 NOTE — Telephone Encounter (Signed)
Spoke with pt. BP readings since 03/29/12 --- 161/96  125/83  155/93 161/89 141/87 136/83 151/93 156/95 156/97 161/103 169/101 159/101 161/98 144/93 . These are all in the morning after coffee and before meds. I will forward to Dr Shirlee Latch for review.

## 2012-04-16 DIAGNOSIS — H02409 Unspecified ptosis of unspecified eyelid: Secondary | ICD-10-CM | POA: Diagnosis not present

## 2012-04-24 DIAGNOSIS — I10 Essential (primary) hypertension: Secondary | ICD-10-CM | POA: Diagnosis not present

## 2012-04-24 DIAGNOSIS — Z79899 Other long term (current) drug therapy: Secondary | ICD-10-CM | POA: Diagnosis not present

## 2012-04-24 DIAGNOSIS — R7309 Other abnormal glucose: Secondary | ICD-10-CM | POA: Diagnosis not present

## 2012-04-24 DIAGNOSIS — E782 Mixed hyperlipidemia: Secondary | ICD-10-CM | POA: Diagnosis not present

## 2012-04-24 DIAGNOSIS — E559 Vitamin D deficiency, unspecified: Secondary | ICD-10-CM | POA: Diagnosis not present

## 2012-05-29 ENCOUNTER — Other Ambulatory Visit (INDEPENDENT_AMBULATORY_CARE_PROVIDER_SITE_OTHER): Payer: Medicare Other

## 2012-05-29 DIAGNOSIS — E7800 Pure hypercholesterolemia, unspecified: Secondary | ICD-10-CM

## 2012-05-29 DIAGNOSIS — I2510 Atherosclerotic heart disease of native coronary artery without angina pectoris: Secondary | ICD-10-CM | POA: Diagnosis not present

## 2012-05-29 LAB — LIPID PANEL
Cholesterol: 150 mg/dL (ref 0–200)
HDL: 35.5 mg/dL — ABNORMAL LOW
Total CHOL/HDL Ratio: 4
Triglycerides: 607 mg/dL — ABNORMAL HIGH (ref 0.0–149.0)
VLDL: 121.4 mg/dL — ABNORMAL HIGH (ref 0.0–40.0)

## 2012-05-29 LAB — HEPATIC FUNCTION PANEL
ALT: 24 U/L (ref 0–53)
AST: 31 U/L (ref 0–37)
Albumin: 3.7 g/dL (ref 3.5–5.2)
Alkaline Phosphatase: 60 U/L (ref 39–117)
Bilirubin, Direct: 0.1 mg/dL (ref 0.0–0.3)
Total Bilirubin: 0.7 mg/dL (ref 0.3–1.2)
Total Protein: 6.6 g/dL (ref 6.0–8.3)

## 2012-05-29 LAB — LDL CHOLESTEROL, DIRECT: Direct LDL: 77.3 mg/dL

## 2012-06-01 ENCOUNTER — Other Ambulatory Visit: Payer: Self-pay | Admitting: *Deleted

## 2012-06-01 DIAGNOSIS — E785 Hyperlipidemia, unspecified: Secondary | ICD-10-CM

## 2012-06-11 ENCOUNTER — Other Ambulatory Visit (INDEPENDENT_AMBULATORY_CARE_PROVIDER_SITE_OTHER): Payer: Medicare Other

## 2012-06-11 DIAGNOSIS — E785 Hyperlipidemia, unspecified: Secondary | ICD-10-CM | POA: Diagnosis not present

## 2012-06-11 LAB — LIPID PANEL
Cholesterol: 165 mg/dL (ref 0–200)
VLDL: 37 mg/dL (ref 0.0–40.0)

## 2012-06-25 DIAGNOSIS — J019 Acute sinusitis, unspecified: Secondary | ICD-10-CM | POA: Diagnosis not present

## 2012-06-28 DIAGNOSIS — J322 Chronic ethmoidal sinusitis: Secondary | ICD-10-CM | POA: Diagnosis not present

## 2012-07-10 ENCOUNTER — Ambulatory Visit
Admission: RE | Admit: 2012-07-10 | Discharge: 2012-07-10 | Disposition: A | Discharge status: Home / Self Care | Payer: Medicare Other | Source: Ambulatory Visit | Attending: Otolaryngology | Admitting: Otolaryngology

## 2012-07-10 ENCOUNTER — Other Ambulatory Visit: Payer: Self-pay | Admitting: Otolaryngology

## 2012-07-10 DIAGNOSIS — R0981 Nasal congestion: Secondary | ICD-10-CM

## 2012-07-10 DIAGNOSIS — J3489 Other specified disorders of nose and nasal sinuses: Secondary | ICD-10-CM | POA: Diagnosis not present

## 2012-07-10 NOTE — Progress Notes (Signed)
Pt here today at [facility] for CT scan of sinuses, fusion protocol.  The exam requires the patient to lie flat, like he would be in the surgical suite.  Due to hardware in his neck from a previous fusion he is unable to lie flat.  Therefore we put a 2" foam sponge under his head.  We felt this was the best thing to do to accommodate the patients needs.

## 2012-07-16 DIAGNOSIS — J322 Chronic ethmoidal sinusitis: Secondary | ICD-10-CM | POA: Diagnosis not present

## 2012-08-06 DIAGNOSIS — J0100 Acute maxillary sinusitis, unspecified: Secondary | ICD-10-CM | POA: Diagnosis not present

## 2012-08-20 DIAGNOSIS — J0100 Acute maxillary sinusitis, unspecified: Secondary | ICD-10-CM | POA: Diagnosis not present

## 2012-09-18 DIAGNOSIS — M4802 Spinal stenosis, cervical region: Secondary | ICD-10-CM | POA: Diagnosis not present

## 2012-09-18 DIAGNOSIS — M5030 Other cervical disc degeneration, unspecified cervical region: Secondary | ICD-10-CM | POA: Diagnosis not present

## 2012-09-18 DIAGNOSIS — M546 Pain in thoracic spine: Secondary | ICD-10-CM | POA: Diagnosis not present

## 2012-09-18 DIAGNOSIS — M5412 Radiculopathy, cervical region: Secondary | ICD-10-CM | POA: Diagnosis not present

## 2012-09-24 DIAGNOSIS — M546 Pain in thoracic spine: Secondary | ICD-10-CM | POA: Diagnosis not present

## 2012-09-24 DIAGNOSIS — M4802 Spinal stenosis, cervical region: Secondary | ICD-10-CM | POA: Diagnosis not present

## 2012-09-24 DIAGNOSIS — M5412 Radiculopathy, cervical region: Secondary | ICD-10-CM | POA: Diagnosis not present

## 2012-09-24 DIAGNOSIS — M5030 Other cervical disc degeneration, unspecified cervical region: Secondary | ICD-10-CM | POA: Diagnosis not present

## 2012-09-26 DIAGNOSIS — Z79899 Other long term (current) drug therapy: Secondary | ICD-10-CM | POA: Diagnosis not present

## 2012-09-26 DIAGNOSIS — I10 Essential (primary) hypertension: Secondary | ICD-10-CM | POA: Diagnosis not present

## 2012-09-26 DIAGNOSIS — R7309 Other abnormal glucose: Secondary | ICD-10-CM | POA: Diagnosis not present

## 2012-09-26 DIAGNOSIS — J019 Acute sinusitis, unspecified: Secondary | ICD-10-CM | POA: Diagnosis not present

## 2012-09-26 DIAGNOSIS — E782 Mixed hyperlipidemia: Secondary | ICD-10-CM | POA: Diagnosis not present

## 2012-10-09 DIAGNOSIS — M4802 Spinal stenosis, cervical region: Secondary | ICD-10-CM | POA: Diagnosis not present

## 2012-10-09 DIAGNOSIS — M5412 Radiculopathy, cervical region: Secondary | ICD-10-CM | POA: Diagnosis not present

## 2012-10-09 DIAGNOSIS — M546 Pain in thoracic spine: Secondary | ICD-10-CM | POA: Diagnosis not present

## 2012-10-09 DIAGNOSIS — M5030 Other cervical disc degeneration, unspecified cervical region: Secondary | ICD-10-CM | POA: Diagnosis not present

## 2013-01-01 DIAGNOSIS — Z961 Presence of intraocular lens: Secondary | ICD-10-CM | POA: Diagnosis not present

## 2013-01-01 DIAGNOSIS — H04129 Dry eye syndrome of unspecified lacrimal gland: Secondary | ICD-10-CM | POA: Diagnosis not present

## 2013-01-01 DIAGNOSIS — H01139 Eczematous dermatitis of unspecified eye, unspecified eyelid: Secondary | ICD-10-CM | POA: Diagnosis not present

## 2013-01-01 DIAGNOSIS — E119 Type 2 diabetes mellitus without complications: Secondary | ICD-10-CM | POA: Diagnosis not present

## 2013-01-21 DIAGNOSIS — E782 Mixed hyperlipidemia: Secondary | ICD-10-CM | POA: Diagnosis not present

## 2013-01-21 DIAGNOSIS — Z125 Encounter for screening for malignant neoplasm of prostate: Secondary | ICD-10-CM | POA: Diagnosis not present

## 2013-01-21 DIAGNOSIS — R7309 Other abnormal glucose: Secondary | ICD-10-CM | POA: Diagnosis not present

## 2013-01-21 DIAGNOSIS — Z79899 Other long term (current) drug therapy: Secondary | ICD-10-CM | POA: Diagnosis not present

## 2013-01-21 DIAGNOSIS — E559 Vitamin D deficiency, unspecified: Secondary | ICD-10-CM | POA: Diagnosis not present

## 2013-01-21 DIAGNOSIS — E119 Type 2 diabetes mellitus without complications: Secondary | ICD-10-CM | POA: Diagnosis not present

## 2013-01-21 DIAGNOSIS — N529 Male erectile dysfunction, unspecified: Secondary | ICD-10-CM | POA: Diagnosis not present

## 2013-01-21 DIAGNOSIS — I10 Essential (primary) hypertension: Secondary | ICD-10-CM | POA: Diagnosis not present

## 2013-01-21 DIAGNOSIS — Z1212 Encounter for screening for malignant neoplasm of rectum: Secondary | ICD-10-CM | POA: Diagnosis not present

## 2013-02-13 ENCOUNTER — Telehealth: Payer: Self-pay | Admitting: Cardiology

## 2013-02-13 NOTE — Telephone Encounter (Signed)
New Problem  Pt returning call// request call back.

## 2013-02-13 NOTE — Telephone Encounter (Signed)
Spoke with patient.

## 2013-04-02 ENCOUNTER — Ambulatory Visit (INDEPENDENT_AMBULATORY_CARE_PROVIDER_SITE_OTHER): Payer: Medicare Other | Admitting: Cardiology

## 2013-04-02 ENCOUNTER — Encounter: Payer: Self-pay | Admitting: Cardiology

## 2013-04-02 VITALS — BP 136/90 | HR 76 | Ht 67.0 in | Wt 186.0 lb

## 2013-04-02 DIAGNOSIS — E785 Hyperlipidemia, unspecified: Secondary | ICD-10-CM

## 2013-04-02 DIAGNOSIS — I10 Essential (primary) hypertension: Secondary | ICD-10-CM

## 2013-04-02 DIAGNOSIS — I2510 Atherosclerotic heart disease of native coronary artery without angina pectoris: Secondary | ICD-10-CM

## 2013-04-02 DIAGNOSIS — Z23 Encounter for immunization: Secondary | ICD-10-CM | POA: Diagnosis not present

## 2013-04-02 MED ORDER — EZETIMIBE 10 MG PO TABS: 10.0000 mg | ORAL_TABLET | Freq: Every day | ORAL | Status: DC

## 2013-04-02 NOTE — Progress Notes (Signed)
Patient ID: [name], male   DOB: 07/27/1945, 67 y.o.   MRN: [id number] PCP: Dr. Oneta Rack  67 yo with history of CAD, HTN, hyperlipidemia presents for followup.  He has had multiple stents in his LAD and RCA.  Last cath in 7/12 showed a totally occluded CFX (known from prior caths) with collaterals from the RCA.  There was no other significant coronary disease and EF was 55%.  He is unable to tolerate statins.   Since last appointment, he has been doing well.  He has lost 13 lbs with diet and increased exercise (was motivated by diagnosis of impaired fasting glucose).  He is going to the gym 3 times/week and walking on other days.  No exertional chest pain or dyspnea.    ECG (9/13): NSR, normal  Labs (7/10): creatinine 1.15, LDL 100, HDL 50, TGs 153 Labs (7/12): K 3.7, creatinine 0.9, LDL 42, HDL 64 Labs (9/13): K 4, creatinine 1.02 Labs (12/13): HDL 41, LDL 88  Allergies (verified):  1)  ! Pcn  Past Medical History: 1. Coronary artery disease.  Patient had LHC in November 2008.  EF was 55% on left ventriculogram.  The circumflex was totally occluded after the first obtuse marginal with collaterals supplying the distal circumflex.  The LAD showed luminal irregularities.  The stent in the LAD was patent.  The RCA showed luminal irregularities.  The stent in the mid RCA and PDA were patent.  There were no interventions.  Patient had LHC in 7/12 with no significant changes from 11/08.   2. Hyperlipidemia: has tried almost every statin and developed severe myalgias. 3. Obesity 4. Chronic low-back pain. 5. Gastroesophageal reflux disease/hiatal hernia.  The patient did have a Nissen fundoplication. 6. History of peptic ulcer disease. 7. Type 2 diabetes, this is well controlled. 8. Hypertension, this is well controlled. 9. History of traumatic spinal fracture in January 2009 after falling off the roof. 10.Cluster headaches. 11.Chronic obstructive pulmonary disease/asthma. 12.Benign  prostatic hypertrophy: s/p surgery.  13.Osteoarthritis. 14.Chronic sinusitis s/p surgery.  15. Impaired fasting glucose  Family History: The patient's father had coronary artery disease developed in his 63s.  Mother had congestive heart failure.  She had a brother who developed congestive heart failure in his 22s.  She had 2 other brothers who had MIs in their 36s.     Social History: The patient is married.  He has 1 child.  He quit smoking in 1979.  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 He drinks alcohol rarely.      Current Outpatient Prescriptions  Medication Sig Dispense Refill  . aspirin EC 81 MG tablet Take 81 mg by mouth daily.      . B Complex-Folic Acid (B COMPLEX-VITAMIN B12 PO) Take 1 tablet by mouth daily.       . clopidogrel (PLAVIX) 75 MG tablet Take 75 mg by mouth daily after breakfast.       . Coenzyme Q10 (CO Q 10) 100 MG CAPS Take 100 mg by mouth daily.       . fish oil-omega-3 fatty acids 1000 MG capsule Take 2 g by mouth daily.       . Flaxseed, Linseed, (FLAX SEED OIL) 1000 MG CAPS Take 1,000 mg by mouth 2 (two) times daily.       . fluconazole (DIFLUCAN) 150 MG tablet Take 150 mg by mouth every Tuesday.       Marland Kitchen GARLIC PO Take 1  capsule by mouth 2 (two) times daily.       . Magnesium 500 MG TABS Take 500 mg by mouth 2 (two) times daily.      . Melatonin 3 MG CAPS Take 10 mg by mouth at bedtime.       . metoprolol succinate (TOPROL-XL) 100 MG 24 hr tablet Take 100 mg by mouth daily after breakfast.       . Multiple Vitamin (MULTIVITAMIN) capsule Take 1 capsule by mouth daily.        . nitroGLYCERIN (NITROSTAT) 0.4 MG SL tablet Place 0.4 mg under the tongue every 5 (five) minutes as needed. For chest pain.      . Turmeric 500 MG CAPS Take 500 mg by mouth daily.       . Valerian 400 MG CAPS Take 400 mg by mouth at bedtime.       Marland Kitchen ezetimibe (ZETIA) 10 MG tablet Take 1 tablet (10 mg total) by mouth daily.  30  tablet  3   No current facility-administered medications for this visit.    BP 136/90  Pulse 76  Ht 5\' 7"  (1.702 m)  Wt 84.369 kg (186 lb)  BMI 29.12 kg/m2 General: NAD, obese.  Neck: No JVD, no thyromegaly or thyroid nodule.  Lungs: Clear to auscultation bilaterally with normal respiratory effort. CV: Nondisplaced PMI.  Heart regular S1/S2, no S3/S4, no murmur.  No peripheral edema.  No carotid bruit.  Normal pedal pulses.  Abdomen: Soft, nontender, no hepatosplenomegaly, no distention.  Neurologic: Alert and oriented x 3.  Psych: Normal affect. Extremities: No clubbing or cyanosis.   Assessment/Plan: 1. CAD: Stable with no ischemic symptoms.  He will continue ASA 81, Plavix, and Toprol XL.  He is currently on Niacin as he is unable to tolerate statins.  Given his borderline blood glucose and the limited trial data supporting Niacin use for hyperlipidemia, I will stop this medication and start him on Zetia 10 mg daily.  2. HTN: BP appears reasonably controlled.    3. Hyperlipidemia: As above, stop Niaspan and start Zetia 10 mg daily.  Lipids/LFTs in 2 months.     Marca Ancona 04/02/2013

## 2013-04-02 NOTE — Patient Instructions (Signed)
Stop Niaspan.   Start Zetia 10mg  daily.   Your physician recommends that you return for a FASTING lipid profile /liver profile/ BMET in 2 months.   Your physician recommends that you schedule a follow-up appointment with Dr Shirlee Latch in 2 months.

## 2013-05-15 ENCOUNTER — Encounter: Payer: Self-pay | Admitting: Internal Medicine

## 2013-05-16 ENCOUNTER — Ambulatory Visit (INDEPENDENT_AMBULATORY_CARE_PROVIDER_SITE_OTHER): Payer: Medicare Other | Admitting: Emergency Medicine

## 2013-05-16 ENCOUNTER — Encounter: Payer: Self-pay | Admitting: Emergency Medicine

## 2013-05-16 ENCOUNTER — Other Ambulatory Visit: Payer: Self-pay | Admitting: Emergency Medicine

## 2013-05-16 VITALS — BP 158/96 | HR 88 | Temp 98.2°F | Resp 16 | Ht 66.5 in | Wt 188.0 lb

## 2013-05-16 DIAGNOSIS — R7309 Other abnormal glucose: Secondary | ICD-10-CM

## 2013-05-16 DIAGNOSIS — E782 Mixed hyperlipidemia: Secondary | ICD-10-CM | POA: Diagnosis not present

## 2013-05-16 DIAGNOSIS — D649 Anemia, unspecified: Secondary | ICD-10-CM | POA: Diagnosis not present

## 2013-05-16 DIAGNOSIS — R5381 Other malaise: Secondary | ICD-10-CM

## 2013-05-16 DIAGNOSIS — E538 Deficiency of other specified B group vitamins: Secondary | ICD-10-CM | POA: Diagnosis not present

## 2013-05-16 DIAGNOSIS — L989 Disorder of the skin and subcutaneous tissue, unspecified: Secondary | ICD-10-CM

## 2013-05-16 DIAGNOSIS — I10 Essential (primary) hypertension: Secondary | ICD-10-CM

## 2013-05-16 DIAGNOSIS — R809 Proteinuria, unspecified: Secondary | ICD-10-CM | POA: Diagnosis not present

## 2013-05-16 LAB — CBC WITH DIFFERENTIAL/PLATELET
Basophils Absolute: 0 10*3/uL (ref 0.0–0.1)
Basophils Relative: 0 % (ref 0–1)
Eosinophils Relative: 2 % (ref 0–5)
HCT: 44.6 % (ref 39.0–52.0)
Lymphocytes Relative: 29 % (ref 12–46)
MCHC: 34.8 g/dL (ref 30.0–36.0)
Monocytes Absolute: 0.5 10*3/uL (ref 0.1–1.0)
Neutro Abs: 4.3 10*3/uL (ref 1.7–7.7)
Platelets: 201 10*3/uL (ref 150–400)
RDW: 13.8 % (ref 11.5–15.5)
WBC: 6.9 10*3/uL (ref 4.0–10.5)

## 2013-05-16 LAB — HEMOGLOBIN A1C: Mean Plasma Glucose: 120 mg/dL — ABNORMAL HIGH (ref <117)

## 2013-05-16 NOTE — Patient Instructions (Signed)
Proteinuria Proteinuria is a condition in which urine contains more protein than is normal. Proteinuria is either a sign that your body is producing too much protein or a sign that there is a problem with the kidneys. Healthy kidneys prevent most substances that the body needs, including proteins, from leaving the bloodstream and ending up in urine. CAUSES  Proteinuria may be caused by a temporary event or condition such as stress, exercise, or fever, and go away on its own. Proteinuria may also be a symptom of a more serious condition or disease. Causes of proteinuria include:  A kidney disease caused by:  Diabetes.  High blood pressure (hypertension).   A disease that affects the immune system, such as lupus.  A genetic disease, such as Alport's syndrome.  Medicines that damage the kidneys, such as long-term nonsteroidal anti-inflammatory drugs (NSAIDs).  Poisoning or exposure to toxic substances.  A reoccurring kidney or urinary infection.  Excess protein production in the body caused by:  Multiple myeloma.  Amyloidosis. SYMPTOMS You may have proteinuria without having noticeable symptoms. If there is a large amount of protein in your urine, your urine may look foamy. You may also notice swelling (edema) in your hands, feet, abdomen, or face. DIAGNOSIS To determine whether you have proteinuria, you will need to provide a urine sample. Your urine will then be tested for too much protein and the main blood protein albumin. If your test shows that you have proteinuria, you may need to take additional tests to determine its cause, how much protein is in your urine, and what type of protein is being lost. Tests may include:  Blood tests.  Urine tests.  A blood pressure measurement.  Imaging tests. TREATMENT  Treatment will depend on the cause of your proteinuria. Your caregiver will discuss treatment options with you after you have been diagnosed. If your proteinuria is mild or  temporary, no treatment may be necessary. HOME CARE INSTRUCTIONS Ask your caregiver if monitoring the level of protein in your urine at home using simple testing strips is appropriate for you. Early detection of proteinuria can lead to early and often successful treatment of the condition causing it. Document Released: 07/20/2005 Document Revised: 02/22/2012 Document Reviewed: 10/28/2011 [employer] Patient Information 2014 [employer].  

## 2013-05-17 LAB — URINALYSIS, ROUTINE W REFLEX MICROSCOPIC
Bilirubin Urine: NEGATIVE
Ketones, ur: NEGATIVE mg/dL
Nitrite: NEGATIVE
Protein, ur: NEGATIVE mg/dL
Specific Gravity, Urine: 1.014 (ref 1.005–1.030)
pH: 6 (ref 5.0–8.0)

## 2013-05-17 LAB — BASIC METABOLIC PANEL WITHOUT GFR
BUN: 27 mg/dL — ABNORMAL HIGH (ref 6–23)
CO2: 26 meq/L (ref 19–32)
Calcium: 8.9 mg/dL (ref 8.4–10.5)
Chloride: 97 meq/L (ref 96–112)
Creat: 1.09 mg/dL (ref 0.50–1.35)
GFR, Est African American: 80 mL/min
GFR, Est Non African American: 69 mL/min
Glucose, Bld: 84 mg/dL (ref 70–99)
Potassium: 4 meq/L (ref 3.5–5.3)
Sodium: 134 meq/L — ABNORMAL LOW (ref 135–145)

## 2013-05-17 LAB — HEPATIC FUNCTION PANEL
ALT: 22 U/L (ref 0–53)
AST: 26 U/L (ref 0–37)
Alkaline Phosphatase: 64 U/L (ref 39–117)
Bilirubin, Direct: 0.1 mg/dL (ref 0.0–0.3)
Indirect Bilirubin: 0.3 mg/dL (ref 0.0–0.9)
Total Bilirubin: 0.4 mg/dL (ref 0.3–1.2)

## 2013-05-17 LAB — LIPID PANEL
Cholesterol: 189 mg/dL (ref 0–200)
HDL: 43 mg/dL
LDL Cholesterol: 103 mg/dL — ABNORMAL HIGH (ref 0–99)
Total CHOL/HDL Ratio: 4.4 ratio
Triglycerides: 213 mg/dL — ABNORMAL HIGH
VLDL: 43 mg/dL — ABNORMAL HIGH (ref 0–40)

## 2013-05-17 LAB — IRON AND TIBC
%SAT: 16 % — ABNORMAL LOW (ref 20–55)
TIBC: 306 ug/dL (ref 215–435)
UIBC: 258 ug/dL (ref 125–400)

## 2013-05-17 LAB — MICROALBUMIN / CREATININE URINE RATIO
Creatinine, Urine: 58.2 mg/dL
Microalb Creat Ratio: 193.5 mg/g — ABNORMAL HIGH (ref 0.0–30.0)
Microalb, Ur: 11.26 mg/dL — ABNORMAL HIGH (ref 0.00–1.89)

## 2013-05-17 LAB — INSULIN, FASTING: Insulin fasting, serum: 18 u[IU]/mL (ref 3–28)

## 2013-05-19 ENCOUNTER — Other Ambulatory Visit: Payer: Self-pay | Admitting: Emergency Medicine

## 2013-05-19 NOTE — Progress Notes (Deleted)
Subjective:    Patient ID: [name], male    DOB: 1945-07-20, 67 y.o.   MRN: [id number]  HPI Comments: 67 yo male comes in with concerns of elevated BS and BP. He notes his BS levels are 180-300. He notes he has not been taking [place] AD. He has been under extreme stress for over 3 months with daughters illness and recent death. He has not been eating healthy. He has not been exercising. He has noted he has increased his alcohol intake with increase stress and depression. He has not been checking his BP.   He also notes increased memory deficit and confusion with the increased stress and elevated BS. He has noticed being easily distracted. He is also fatigued, but notes he is not sleeping well with stress.   He  is urinating more frequently and concerned if medication or from alcohol. He denies any hematuria or fever.   Hypertension  Hyperlipidemia    Current Outpatient Prescriptions on File Prior to Visit  Medication Sig Dispense Refill  . aspirin EC 81 MG tablet Take 81 mg by mouth daily.      . B Complex-Folic Acid (B COMPLEX-VITAMIN B12 PO) Take 1 tablet by mouth daily.       . clopidogrel (PLAVIX) 75 MG tablet Take 75 mg by mouth daily after breakfast.       . Coenzyme Q10 (CO Q 10) 100 MG CAPS Take 100 mg by mouth daily.       . fish oil-omega-3 fatty acids 1000 MG capsule Take 2 g by mouth daily.       . Flaxseed, Linseed, (FLAX SEED OIL) 1000 MG CAPS Take 1,000 mg by mouth 2 (two) times daily.       . fluconazole (DIFLUCAN) 150 MG tablet Take 150 mg by mouth every Tuesday.       Marland Kitchen GARLIC PO Take 1 capsule by mouth 2 (two) times daily.       . Magnesium 500 MG TABS Take 500 mg by mouth 2 (two) times daily.      . Melatonin 3 MG CAPS Take 10 mg by mouth 2 (two) times daily.       . Multiple Vitamin (MULTIVITAMIN) capsule Take 1 capsule by mouth daily.        . nitroGLYCERIN (NITROSTAT) 0.4 MG SL tablet Place 0.4 mg under the tongue every 5 (five) minutes as needed. For  chest pain.      . Turmeric 500 MG CAPS Take 500 mg by mouth daily.       . Valerian 400 MG CAPS Take 400 mg by mouth at bedtime.       Marland Kitchen ezetimibe (ZETIA) 10 MG tablet Take 1 tablet (10 mg total) by mouth daily.  30 tablet  3  . metoprolol succinate (TOPROL-XL) 100 MG 24 hr tablet Take 100 mg by mouth daily after breakfast.        No current facility-administered medications on file prior to visit.   ALLERGIES Adhesive; Codeine; Crestor; Cymbalta; Dilaudid; Effexor; Morphine and related; Nsaids; Percocet; Sulfa antibiotics; Topamax; Trazodone and nefazodone; and Penicillins  Past Medical History  Diagnosis Date  . CAD (coronary artery disease)     pt last left heart cath was in Nov 2008. EF was 55% on left ventriculogram. Circumflex was totally occluded after the 1st obtuse marginal with collaterals supplying the distal circumflex. LAD showed luminal irregularities. The stent in LAD was patent. The RCA showed luminal irregularities. The stent in  th emid RCA and PDA were patent. There were no inverventions.   . Obesity   . Low back pain     chronic  . GERD (gastroesophageal reflux disease)     hiatal hernia. Patient did hav ea Nissen fundoplication  . Peptic ulcer disease   . DM2 (diabetes mellitus, type 2)     well controlled  . Spinal fracture     hx of traumatic in Jan 2009 after falling off the roof  . Chronic obstructive pulmonary disease     asthma  . Benign prostatic hypertrophy   . Osteoarthritis   . Anxiety     treated /w Xanax for mild depression , using 2 times per day, not PRN  . Depression   . Asthma   . Sleep concern     states the study (2003 )was done at [facility] it failed & he was told to return & he never did. Pt. states he has been found by prev. hosp. staff that he has to be told when to breathe & his wife does the same.   . Cluster headache     relative to sinus problems   . Cluster headache   . History of blood transfusion     need for Bld.  transfusion relative to taking NSAIDS  . Cancer     basal cell- face & head  . 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 HTN (hypertension)     pt. followed by [facility], last cardiac visit 2012, one yr. ago      Review of Systems  Constitutional: Positive for fatigue.  Genitourinary: Positive for frequency.  Psychiatric/Behavioral: Positive for confusion and sleep disturbance. Negative for suicidal ideas.  All other systems reviewed and are negative.    BP 158/96  Pulse 88  Temp(Src) 98.2 F (36.8 C) (Temporal)  Resp 16  Ht 5' 6.5" (1.689 m)  Wt 188 lb (85.276 kg)  BMI 29.89 kg/m2     Objective:   Physical Exam  Nursing note and vitals reviewed. Constitutional: He is oriented to person, place, and time. He appears well-developed and well-nourished.  HENT:  Head: Normocephalic and atraumatic.  Right Ear: External ear normal.  Left Ear: External ear normal.  Nose: Nose normal.  Eyes: Conjunctivae and EOM are normal.  Neck: Normal range of motion. Neck supple. No JVD present. No thyromegaly present.  Cardiovascular: Normal rate, regular rhythm, normal heart sounds and intact distal pulses.   Pulmonary/Chest: Effort normal and breath sounds normal.  Abdominal: Soft. Bowel sounds are normal. He exhibits no distension and no mass. There is no tenderness. There is no rebound and no guarding.  Musculoskeletal: Normal range of motion. He exhibits no edema and no tenderness.  Lymphadenopathy:    He has no cervical adenopathy.  Neurological: He is alert and oriented to person, place, and time. No cranial nerve deficit. Coordination normal.  Skin: Skin is warm and dry.  Psychiatric: He has a normal mood and affect. His behavior is normal. Judgment and thought content normal.          Assessment & Plan:  1. Elevated BP with stress and depression over daughters recent death. Check BP call if >140/80 2. Elevated  BS with stress and increased alcohol use, and misuse of medicine. Decrease ETOH, increase Farxiga to 10 mg SX #42. Call if BS >190. Check labs. 3. Fatigue and memory deficit vs depression-  Check labs. Advise needs to start counseling. ER if symptoms increase. Decrease alcohol intake.  4. Urine frequency vs medication vs alcohol- check labs, increase H2o  Over 40 minutes of exam, chart review, counseling and referral performed

## 2013-05-19 NOTE — Progress Notes (Addendum)
Subjective:    Patient ID: [name], male    DOB: 05-Dec-1945, 67 y.o.   MRN: [id number]  HPI Comments: 67 yo male presents for 3 month F/U for HTN, Cholesterol, Pre-Dm, D. Deficient. He is trying to increase activity but denies actual cardio. He notes BP 130-40s/80s at home. He is eating descent. LAST ABN LABS T 201 TG 271 LDL 106  He has noticed increased afternoon fatigue and memory deficit. He notes mild irritation increase with increased stress. He has been misplacing more items and having word finding difficulty. He also notes decreased ability to focus. He denies any weakness or other CVA type symptoms.  He has noticed a mole that continue to come back on top of his head. He notes he occasional scratches off with washing/ combing hair and area occasionally bleeds/ itches. He has history of skin cancer on head in past.  He still notes chronic yeast problem. He has tried multiple soaps for body and laundry with out any relief. He notes eating yogurt almost daily and still has rash on and off, without any specific trigger.   Current Outpatient Prescriptions on File Prior to Visit  Medication Sig Dispense Refill  . aspirin EC 81 MG tablet Take 81 mg by mouth daily.      . B Complex-Folic Acid (B COMPLEX-VITAMIN B12 PO) Take 1 tablet by mouth daily.       . clopidogrel (PLAVIX) 75 MG tablet Take 75 mg by mouth daily after breakfast.       . Coenzyme Q10 (CO Q 10) 100 MG CAPS Take 100 mg by mouth daily.       . fish oil-omega-3 fatty acids 1000 MG capsule Take 2 g by mouth daily.       . Flaxseed, Linseed, (FLAX SEED OIL) 1000 MG CAPS Take 1,000 mg by mouth 2 (two) times daily.       . fluconazole (DIFLUCAN) 150 MG tablet Take 150 mg by mouth every Tuesday.       Marland Kitchen GARLIC PO Take 1 capsule by mouth 2 (two) times daily.       . Magnesium 500 MG TABS Take 500 mg by mouth 2 (two) times daily.      . Melatonin 3 MG CAPS Take 10 mg by mouth 2 (two) times daily.       . Multiple  Vitamin (MULTIVITAMIN) capsule Take 1 capsule by mouth daily.        . nitroGLYCERIN (NITROSTAT) 0.4 MG SL tablet Place 0.4 mg under the tongue every 5 (five) minutes as needed. For chest pain.      . Turmeric 500 MG CAPS Take 500 mg by mouth daily.       . Valerian 400 MG CAPS Take 400 mg by mouth at bedtime.       Marland Kitchen ezetimibe (ZETIA) 10 MG tablet Take 1 tablet (10 mg total) by mouth daily.  30 tablet  3  . metoprolol succinate (TOPROL-XL) 100 MG 24 hr tablet Take 100 mg by mouth daily after breakfast.        No current facility-administered medications on file prior to visit.   ALLERGIES Adhesive; Codeine; Crestor; Cymbalta; Dilaudid; Effexor; Morphine and related; Nsaids; Percocet; Sulfa antibiotics; Topamax; Trazodone and nefazodone; and Penicillins  Past Medical History  Diagnosis Date  . CAD (coronary artery disease)     pt last left heart cath was in Nov 2008. EF was 55% on left ventriculogram. Circumflex was totally occluded after the  1st obtuse marginal with collaterals supplying the distal circumflex. LAD showed luminal irregularities. The stent in LAD was patent. The RCA showed luminal irregularities. The stent in th emid RCA and PDA were patent. There were no inverventions.   . Obesity   . Low back pain     chronic  . GERD (gastroesophageal reflux disease)     hiatal hernia. Patient did hav ea Nissen fundoplication  . Peptic ulcer disease   . DM2 (diabetes mellitus, type 2)     well controlled  . Spinal fracture     hx of traumatic in Jan 2009 after falling off the roof  . Chronic obstructive pulmonary disease     asthma  . Benign prostatic hypertrophy   . Osteoarthritis   . Anxiety     treated /w Xanax for mild depression , using 2 times per day, not PRN  . Depression   . Asthma   . Sleep concern     states the study (2003 )was done at [facility] it failed & he was told to return & he never did. Pt. states he has been found by prev. hosp. staff that he has to be  told when to breathe & his wife does the same.   . Cluster headache     relative to sinus problems   . Cluster headache   . History of blood transfusion     need for Bld. transfusion relative to taking NSAIDS  . Cancer     basal cell- face & head  . 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 HTN (hypertension)     pt. followed by [facility], last cardiac visit 2012, one yr. ago     Review of Systems  Constitutional: Positive for fatigue.  Skin: Positive for rash.  Psychiatric/Behavioral: Positive for confusion and agitation.  All other systems reviewed and are negative.    BP 158/96  Pulse 88  Temp(Src) 98.2 F (36.8 C) (Temporal)  Resp 16  Ht 5' 6.5" (1.689 m)  Wt 188 lb (85.276 kg)  BMI 29.89 kg/m2     Objective:   Physical Exam  Nursing note and vitals reviewed. Constitutional: He is oriented to person, place, and time. He appears well-developed and well-nourished.  HENT:  Head: Normocephalic and atraumatic.  Right Ear: External ear normal.  Left Ear: External ear normal.  Nose: Nose normal.  Mouth/Throat: Oropharynx is clear and moist. No oropharyngeal exudate.  Eyes: Conjunctivae are normal.  Neck: Normal range of motion. Neck supple. No JVD present. No thyromegaly present.  Cardiovascular: Normal rate, regular rhythm, normal heart sounds and intact distal pulses.   Pulmonary/Chest: Effort normal and breath sounds normal.  Abdominal: Soft. Bowel sounds are normal. He exhibits no distension and no mass. There is no tenderness.  Musculoskeletal: Normal range of motion.  Lymphadenopathy:    He has no cervical adenopathy.  Neurological: He is alert and oriented to person, place, and time. No cranial nerve deficit. Coordination normal.  Skin: Skin is warm and dry. No rash noted.  Frontal scalp with 4-6 mm elevated crusting with mild erythema at base   No obvious skin rash today for Yeast.   Psychiatric: He has a normal mood and affect. His behavior is normal. Judgment and thought content normal.          Assessment & Plan:  1. 3 month F/U for HTN, Cholesterol, Pre-Dm, D. Deficient- CHECK  BP IF GREATER THAN 140/90 CALL OFFICE-  needs healthier diet, weight loss and cardio QD.  2. Fatigue vs sleep apnea vs stress- Check labs, needs to increase H2o, protein snacks, Take 10 minute cardio mental breaks, make lists to help keep on track, creat a schedule and repeat QD. If all labs negative get sleep study. If that's neg consider depression vs ADD. 3. Frontal scalp recurrent lesion- ref skin surgery center 4. Recurrent yeast- Continue to improve diet, Trial of restora 1 box given  OVER 40 minutes of exam, counseling, chart review, referral performed

## 2013-05-20 NOTE — Addendum Note (Signed)
Addended by: Loree Fee R on: 05/20/2013 08:55 AM   Modules accepted: Orders, Level of Service, SmartSet

## 2013-05-20 NOTE — Progress Notes (Signed)
This encounter was created in error - please disregard.

## 2013-05-20 NOTE — Addendum Note (Signed)
Addended by: Loree Fee R on: 05/20/2013 08:42 AM   Modules accepted: Orders, Level of Service, SmartSet

## 2013-05-21 NOTE — Addendum Note (Signed)
Addended by: Loree Fee R on: 05/21/2013 04:03 AM   Modules accepted: Orders, Level of Service

## 2013-05-31 ENCOUNTER — Other Ambulatory Visit: Payer: Medicare Other

## 2013-06-03 ENCOUNTER — Other Ambulatory Visit (INDEPENDENT_AMBULATORY_CARE_PROVIDER_SITE_OTHER): Payer: Medicare Other

## 2013-06-03 DIAGNOSIS — I2510 Atherosclerotic heart disease of native coronary artery without angina pectoris: Secondary | ICD-10-CM | POA: Diagnosis not present

## 2013-06-03 DIAGNOSIS — E785 Hyperlipidemia, unspecified: Secondary | ICD-10-CM

## 2013-06-03 DIAGNOSIS — I10 Essential (primary) hypertension: Secondary | ICD-10-CM | POA: Diagnosis not present

## 2013-06-03 LAB — LDL CHOLESTEROL, DIRECT: Direct LDL: 116 mg/dL

## 2013-06-03 LAB — BASIC METABOLIC PANEL
CO2: 28 mEq/L (ref 19–32)
Calcium: 8.9 mg/dL (ref 8.4–10.5)
Chloride: 99 mEq/L (ref 96–112)
Glucose, Bld: 103 mg/dL — ABNORMAL HIGH (ref 70–99)
Potassium: 4 mEq/L (ref 3.5–5.1)
Sodium: 135 mEq/L (ref 135–145)

## 2013-06-03 LAB — LIPID PANEL
HDL: 39.3 mg/dL (ref >39.00)
Total CHOL/HDL Ratio: 5
Triglycerides: 280 mg/dL — ABNORMAL HIGH (ref 0.0–149.0)
VLDL: 56 mg/dL — ABNORMAL HIGH (ref 0.0–40.0)

## 2013-06-03 LAB — HEPATIC FUNCTION PANEL
ALT: 24 U/L (ref 0–53)
AST: 31 U/L (ref 0–37)
Albumin: 3.9 g/dL (ref 3.5–5.2)
Total Bilirubin: 0.4 mg/dL (ref 0.3–1.2)

## 2013-06-10 ENCOUNTER — Encounter: Payer: Self-pay | Admitting: Cardiology

## 2013-06-10 ENCOUNTER — Ambulatory Visit (INDEPENDENT_AMBULATORY_CARE_PROVIDER_SITE_OTHER): Payer: Medicare Other | Admitting: Cardiology

## 2013-06-10 VITALS — BP 120/70 | HR 72 | Ht 66.5 in | Wt 187.0 lb

## 2013-06-10 DIAGNOSIS — I10 Essential (primary) hypertension: Secondary | ICD-10-CM | POA: Diagnosis not present

## 2013-06-10 DIAGNOSIS — I2510 Atherosclerotic heart disease of native coronary artery without angina pectoris: Secondary | ICD-10-CM

## 2013-06-10 DIAGNOSIS — E785 Hyperlipidemia, unspecified: Secondary | ICD-10-CM

## 2013-06-10 DIAGNOSIS — E119 Type 2 diabetes mellitus without complications: Secondary | ICD-10-CM | POA: Diagnosis not present

## 2013-06-10 MED ORDER — COLESEVELAM HCL 625 MG PO TABS: 1875.0000 mg | ORAL_TABLET | Freq: Two times a day (BID) | ORAL | Status: DC

## 2013-06-10 NOTE — Progress Notes (Signed)
Patient ID: [name], male   DOB: [birth year], 67 y.o.   MRN: [id number] PCP: Dr. Oneta Rack  67 yo with history of CAD, HTN, hyperlipidemia presents for followup.  He has had multiple stents in his LAD and RCA.  Last cath in 7/12 showed a totally occluded CFX (known from prior caths) with collaterals from the RCA.  There was no other significant coronary disease and EF was 55%.  He is unable to tolerate statins.   Since last appointment, he has been doing well.  He is going to the gym 3 times/week and walking on other days.  No exertional chest pain or dyspnea.  At last appointment, I tried him on Zetia (stopping Niacin).  He felt "bad" on Zetia so stopped it and restarted Niacin.  LDL is still above goal.    Labs (7/10): creatinine 1.15, LDL 100, HDL 50, TGs 153 Labs (7/12): K 3.7, creatinine 0.9, LDL 42, HDL 64 Labs (9/13): K 4, creatinine 1.02 Labs (12/13): HDL 41, LDL 88 Labs (12/14): K 4, creatinine 1.1, LDL 116  Allergies (verified):  1)  ! Pcn  Past Medical History: 1. Coronary artery disease.  Patient had LHC in November 2008.  EF was 55% on left ventriculogram.  The circumflex was totally occluded after the first obtuse marginal with collaterals supplying the distal circumflex.  The LAD showed luminal irregularities.  The stent in the LAD was patent.  The RCA showed luminal irregularities.  The stent in the mid RCA and PDA were patent.  There were no interventions.  Patient had LHC in 7/12 with no significant changes from 11/08.   2. Hyperlipidemia: has tried almost every statin and developed severe myalgias.  Could not tolerate Zetia.  3. Obesity 4. Chronic low-back pain. 5. Gastroesophageal reflux disease/hiatal hernia.  The patient did have a Nissen fundoplication. 6. History of peptic ulcer disease. 7. Type 2 diabetes, this is well controlled. 8. Hypertension, this is well controlled. 9. History of traumatic spinal fracture in January 2009 after falling off the  roof. 10.Cluster headaches. 11.Chronic obstructive pulmonary disease/asthma. 12.Benign prostatic hypertrophy: s/p surgery.  13.Osteoarthritis. 14.Chronic sinusitis s/p surgery.  15. Impaired fasting glucose  Family History: The patient's father had coronary artery disease developed in his 44s.  Mother had congestive heart failure.  She had a brother who developed congestive heart failure in his 65s.  She had 2 other brothers who had MIs in their 78s.     Social History: The patient is married.  He has 1 child.  He quit smoking in 1979.  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 He drinks alcohol rarely.      Current Outpatient Prescriptions  Medication Sig Dispense Refill  . aspirin EC 81 MG tablet Take 81 mg by mouth daily.      . B Complex-Folic Acid (B COMPLEX-VITAMIN B12 PO) Take 1 tablet by mouth daily.       . chlorpheniramine (CHLOR-TRIMETON) 4 MG tablet Take 4 mg by mouth 4 (four) times daily as needed for allergies.      Marland Kitchen clopidogrel (PLAVIX) 75 MG tablet Take 75 mg by mouth daily after breakfast.       . Coenzyme Q10 (CO Q 10) 100 MG CAPS Take 100 mg by mouth daily.       . fish oil-omega-3 fatty acids 1000 MG capsule Take 2 g by mouth daily.       . Flaxseed, Linseed, (FLAX SEED  OIL) 1000 MG CAPS Take 1,000 mg by mouth 2 (two) times daily.       . fluconazole (DIFLUCAN) 150 MG tablet Take 150 mg by mouth every Tuesday.       Marland Kitchen GARLIC PO Take 1 capsule by mouth 2 (two) times daily.       Marland Kitchen lisinopril (PRINIVIL,ZESTRIL) 20 MG tablet Take 20 mg by mouth daily.      . Magnesium 500 MG TABS Take 500 mg by mouth 2 (two) times daily.      . Melatonin 3 MG CAPS Take 10 mg by mouth 2 (two) times daily.       . Multiple Vitamin (MULTIVITAMIN) capsule Take 1 capsule by mouth daily.        . naproxen sodium (ANAPROX) 220 MG tablet Take 220 mg by mouth daily as needed.      . nitroGLYCERIN (NITROSTAT) 0.4 MG SL tablet Place 0.4 mg  under the tongue every 5 (five) minutes as needed. For chest pain.      . Turmeric 500 MG CAPS Take 500 mg by mouth daily.       . Valerian 400 MG CAPS Take 400 mg by mouth at bedtime.       . colesevelam (WELCHOL) 625 MG tablet Take 3 tablets (1,875 mg total) by mouth 2 (two) times daily with a meal.  180 tablet  11   No current facility-administered medications for this visit.    BP 120/70  Pulse 72  Ht 5' 6.5" (1.689 m)  Wt 84.823 kg (187 lb)  BMI 29.73 kg/m2 General: NAD, obese.  Neck: No JVD, no thyromegaly or thyroid nodule.  Lungs: Clear to auscultation bilaterally with normal respiratory effort. CV: Nondisplaced PMI.  Heart regular S1/S2, no S3/S4, no murmur.  No peripheral edema.  No carotid bruit.  Normal pedal pulses.  Abdomen: Soft, nontender, no hepatosplenomegaly, no distention.  Neurologic: Alert and oriented x 3.  Psych: Normal affect. Extremities: No clubbing or cyanosis.   Assessment/Plan: 1. CAD: Stable with no ischemic symptoms.  He will continue ASA 81, Plavix, and Toprol XL.  He is currently on Niacin as he is unable to tolerate statins or Zetia.   2. HTN: BP appears reasonably controlled.    3. Hyperlipidemia: LDL is still high.  I will have him try Welcol 3 tabs bid with lipids/LFTs in 2 months.  I am also going to have him consult with the [facility] nutritionist regarding dietary changes to help with lipids and diabetes.     Marca Ancona 06/10/2013

## 2013-06-10 NOTE — Patient Instructions (Signed)
Start Welchol 1875mg  3 tablets twice daily  You have been referred to [facility] @ [facility]  Your physician wants you to follow-up in: 1 year You will receive a reminder letter in the mail two months in advance. If you don't receive a letter, please call our office to schedule the follow-up appointment.

## 2013-06-26 ENCOUNTER — Encounter: Payer: Self-pay | Admitting: Physician Assistant

## 2013-06-26 ENCOUNTER — Ambulatory Visit (INDEPENDENT_AMBULATORY_CARE_PROVIDER_SITE_OTHER): Payer: Medicare Other | Admitting: Physician Assistant

## 2013-06-26 VITALS — BP 148/78 | HR 88 | Temp 99.0°F | Resp 16 | Ht 67.0 in | Wt 190.0 lb

## 2013-06-26 DIAGNOSIS — J0100 Acute maxillary sinusitis, unspecified: Secondary | ICD-10-CM

## 2013-06-26 MED ORDER — AZITHROMYCIN 250 MG PO TABS: ORAL_TABLET | ORAL | Status: DC

## 2013-06-26 NOTE — Patient Instructions (Addendum)
Please take the prednisone to help decrease inflammation and therefore decrease symptoms. Take it it with food to avoid GI upset. It can cause increased energy but on the other hand it can make it hard to sleep at night so please take it in the morning.  It is not an antibiotic so you can stop it early if you are feeling better.  If you are diabetic it will increase your sugars.  Do 40mg  today of prednisone and 20mg  for 2 days then stop.   The majority of colds are caused by viruses and do not require antibiotics. Please read the rest of this hand out to learn more about the common cold and what you can do to help yourself as well as help prevent the over use of antibiotics.   COMMON COLD SIGNS AND SYMPTOMS - The common cold usually causes nasal congestion, runny nose, and sneezing. A sore throat may be present on the first day but usually resolves quickly. If a cough occurs, it generally develops on about the fourth or fifth day of symptoms, typically when congestion and runny nose are resolving  COMMON COLD COMPLICATIONS - In most cases, colds do not cause serious illness or complications. Most colds last for three to seven days, although many people continue to have symptoms (coughing, sneezing, congestion) for up to two weeks.  One of the more common complications is sinusitis, which is usually caused by viruses and rarely (about 2 percent of the time) by bacteria. Having thick or yellow to green-colored nasal discharge does not mean that bacterial sinusitis has developed; discolored nasal discharge is a normal phase of the common cold.  Lower respiratory infections, such as pneumonia or bronchitis, may develop following a cold.  Infection of the middle ear, or otitis media, can accompany or follow a cold.  COMMON COLD TREATMENT - There is no specific treatment for the viruses that cause the common cold. Most treatments are aimed at relieving some of the symptoms of the cold, but do not shorten or  cure the cold. Antibiotics are not useful for treating the common cold; antibiotics are only used to treat illnesses caused by bacteria, not viruses. Unnecessary use of antibiotics for the treatment of the common cold can cause allergic reactions, diarrhea, or other gastrointestinal symptoms in some patients.  The symptoms of a cold will resolve over time, even without any treatment. People with underlying medical conditions and those who use other over-the-counter or prescription medications should speak with their healthcare provider or pharmacist to ensure that it is safe to use these treatments. The following are treatments that may reduce the symptoms caused by the common cold.  Nasal congestion - Decongestants are good for nasal congestion- if you feel very stuffy but no mucus is coming out, this is the medication that will help you the most.  Pseudoephedrine is a decongestant that can improve nasal congestion. Although a prescription is not required, drugstores in the [place] keep pseudoephedrine behind the counter, so it must be requested from a pharmacist. If you have a heart condition or high blood pressure please use Coricidin BPH instead.   Runny nose - Antihistamines such as diphenhydramine (Benadryl), certazine (Zyrtec) which are best taking at night because they can make you tired OR loratadine (Claritin),  fexafinadine (Allegra) help with a runny nose.   Nasal sprays such an oxymetazoline (Afrin and others) may also give temporary relief of nasal congestion. However, these sprays should never be used for more than two  to three days; use for more than three days use can worsen congestion.  Nasocort is now over the counter and can help decrease a runny nose. Please stop the medication if you have blurry vision or nose bleeds.   Sore throat and headache - Sore throat and headache are best treated with a mild pain reliever such as acetaminophen (Tylenol) or a non-steroidal  anti-inflammatory agent such as ibuprofen or naproxen (Motrin or Aleve). These medications should be taken with food to prevent stomach problems. As well as gargling with warm water and salt.   Cough - Common cough medicine ingredients include guaifenesin and dextromethorphan; these are often combined with other medications in over-the-counter cold formulas. Often a cough is worse at night or first in the morning due to post nasal drip from you nose. You can try to sleep at an angle to decrease a cough.   Alternative treatments - Heated, humidified air can improve symptoms of nasal congestion and runny nose, and causes few to no side effects. A number of alternative products, including vitamin C, doubling up on your vitamin D and herbal products such as echinacea, may help. Certain products, such as nasal gels that contain zinc (eg, Zicam), have been associated with a permanent loss of smell.  Antibiotics - Antibiotics should not be used to treat an uncomplicated common cold. As noted above, colds are caused by viruses. Antibiotics treat bacterial, not viral infections. Some viruses that cause the common cold can also depress the immune system or cause swelling in the lining of the nose or airways; this can, in turn, lead to a bacterial infection. Often you need to give your body 7 days to fight off a common cold while treating the symptoms with the medications listed above. If after 7 days your symptoms are not improving, you are getting worse, you have shortness of breath, chest pain, a fever of over 103 you should seek medical help immediately.   PREVENTION IS THE BEST MEDICINE - Hand washing is an essential and highly effective way to prevent the spread of infection.  Alcohol-based hand rubs are a good alternative for disinfecting hands if a sink is not available.  Hands should be washed before preparing food and eating and after coughing, blowing the nose, or sneezing. While it is not always possible  to limit contact with people who may be infected with a cold, touching the eyes, nose, or mouth after direct contact should be avoided when possible. Sneezing/coughing into the sleeve of one's clothing (at the inner elbow) is another means of containing sprays of saliva and secretions and does not contaminate the hands.

## 2013-06-26 NOTE — Progress Notes (Signed)
   Subjective:    Patient ID: [name], male    DOB: [birth year], 68 y.o.   MRN: [id number]  Sinus Problem This is a new problem. The current episode started in the past 7 days. The problem is unchanged. The maximum temperature recorded prior to his arrival was 100 - 100.9 F. The fever has been present for 1 to 2 days. Associated symptoms include congestion, coughing, headaches, sinus pressure, sneezing, a sore throat and swollen glands. Pertinent negatives include no chills, diaphoresis, ear pain, hoarse voice, neck pain or shortness of breath. Treatments tried: antihistimine.   Review of Systems  Constitutional: Positive for fatigue. Negative for fever, chills and diaphoresis.  HENT: Positive for congestion, sinus pressure, sneezing and sore throat. Negative for ear pain and hoarse voice.   Eyes: Negative.   Respiratory: Positive for cough and wheezing. Negative for chest tightness and shortness of breath.   Gastrointestinal: Negative.   Genitourinary: Negative.   Musculoskeletal: Negative for neck pain.  Neurological: Positive for headaches.       Objective:   Physical Exam  Constitutional: He appears well-developed and well-nourished.  HENT:  Head: Normocephalic and atraumatic.  Right Ear: External ear normal. A middle ear effusion is present.  Left Ear: External ear normal. A middle ear effusion is present.  Nose: Right sinus exhibits maxillary sinus tenderness. Left sinus exhibits maxillary sinus tenderness.  Mouth/Throat: Oropharynx is clear and moist.  Eyes: Conjunctivae are normal. Pupils are equal, round, and reactive to light.  Neck: Normal range of motion. Neck supple.  Cardiovascular: Normal rate, regular rhythm and normal heart sounds.   Pulmonary/Chest: Effort normal and breath sounds normal. He has no wheezes.  Abdominal: Soft. Bowel sounds are normal.  Lymphadenopathy:    He has no cervical adenopathy.  Skin: Skin is warm and dry.      Assessment & Plan:   Acute maxillary sinusitis - Plan: azithromycin (ZITHROMAX) 250 MG tablet

## 2013-06-27 ENCOUNTER — Ambulatory Visit: Payer: Self-pay | Admitting: Physician Assistant

## 2013-06-27 DIAGNOSIS — L218 Other seborrheic dermatitis: Secondary | ICD-10-CM | POA: Diagnosis not present

## 2013-06-27 DIAGNOSIS — L820 Inflamed seborrheic keratosis: Secondary | ICD-10-CM | POA: Diagnosis not present

## 2013-06-27 DIAGNOSIS — C4441 Basal cell carcinoma of skin of scalp and neck: Secondary | ICD-10-CM | POA: Diagnosis not present

## 2013-06-27 DIAGNOSIS — D485 Neoplasm of uncertain behavior of skin: Secondary | ICD-10-CM | POA: Diagnosis not present

## 2013-07-01 DIAGNOSIS — M25519 Pain in unspecified shoulder: Secondary | ICD-10-CM | POA: Diagnosis not present

## 2013-07-18 ENCOUNTER — Encounter: Payer: Self-pay | Admitting: *Deleted

## 2013-07-18 ENCOUNTER — Encounter: Payer: Medicare Other | Attending: Cardiology | Admitting: *Deleted

## 2013-07-18 VITALS — Ht 67.0 in | Wt 190.2 lb

## 2013-07-18 DIAGNOSIS — E785 Hyperlipidemia, unspecified: Secondary | ICD-10-CM | POA: Diagnosis not present

## 2013-07-18 DIAGNOSIS — E119 Type 2 diabetes mellitus without complications: Secondary | ICD-10-CM | POA: Insufficient documentation

## 2013-07-18 DIAGNOSIS — Z713 Dietary counseling and surveillance: Secondary | ICD-10-CM | POA: Diagnosis not present

## 2013-07-18 NOTE — Progress Notes (Signed)
  Medical Nutrition Therapy:  Appt start time: 1500 end time:  1600.  Assessment:  Primary concerns today:diabetes and hyperlipidemia.  Lives with wife, they share the food shopping and preparation. He states he exercises 3 times a week at the gym doing stretches, resistance exercises and cardio on the treadmill. He states he has had diabetes for several years, no medication at this time and his problem is low BG on occasion. He has a meter, only checks if he feels bad. He has omitted sugars from desserts and sodas to help to decrease triglycerides but they are not in acceptable ranges yet. He eliminates simple sugars and refined carbohydrates anyway.   Preferred Learning Style:   No preference indicated   Learning Readiness:   Ready  Change in progress   MEDICATIONS: see list   DIETARY INTAKE:  24-hr recall:  B ( AM): protein shake with blueberries and cherries, walnuts. OR cheese toast and fresh fruit, coffee with 2% milk and stevia  Snk ( AM): fresh fruit and handful of nuts  L ( PM): could be hot meal such as a vegetable plate with soup OR sandwich OR left overs, water to drink Snk ( PM):  fresh fruit and handful of nuts   D ( PM): similar to lunch; try to eat at home more often. Lean meat, vegetables, occasionally corn bread Snk ( PM): small serving of yogurt OR PNB crackers OR 1 slice cheese OR cottage cheese Beverages: coffee, water  Usual physical activity: gym 3 days a week and walks his dog regularly  Estimated energy needs: 1600 calories 180 g carbohydrates 120 g protein 44 g fat    Intervention:  Nutrition counseling and assessment completed. He is eating and exercising appropriately. States strong family history of cardiac disease and several male members of his family died near the age of 64. Encouraged him to continue with his current eating habits and to continue to be active as he is able.  Plan:  Aim for 2-3 Carb Choices per meal (30-45 grams) +/- 1 either way   Aim for 0-2 Carbs per snack if hungry  Continue choosing lean meats and other lean proteins with snacks and meals Consider using your 1/2 cup measure at home for serving your starchy or other higher carb foods Continue reading food labels for Total Carbohydrate and Fat Grams of foods Continue with your activity level  daily as tolerated Consider checking BG at alternate times per day as directed by MD   Teaching Method Utilized: Visual, Auditory and Hands on  Handouts given during visit include: Living Well with Diabetes Carb Counting and Food Label handouts Meal Plan Card  Barriers to learning/adherence to lifestyle change: none  Demonstrated degree of understanding via:  Teach Back   Monitoring/Evaluation:  Dietary intake, exercise, reading food labels, and body weight prn.

## 2013-07-18 NOTE — Patient Instructions (Signed)
Plan:  Aim for 2-3 Carb Choices per meal (30-45 grams) +/- 1 either way  Aim for 0-2 Carbs per snack if hungry  Continue choosing lean meats and other lean proteins with snacks and meals Consider using your 1/2 cup measure at home for serving your starchy or other higher carb foods Continue reading food labels for Total Carbohydrate and Fat Grams of foods Continue with your activity level  daily as tolerated Consider checking BG at alternate times per day as directed by MD

## 2013-07-25 ENCOUNTER — Ambulatory Visit (INDEPENDENT_AMBULATORY_CARE_PROVIDER_SITE_OTHER): Payer: Medicare Other | Admitting: Internal Medicine

## 2013-07-25 ENCOUNTER — Encounter: Payer: Self-pay | Admitting: Internal Medicine

## 2013-07-25 VITALS — BP 144/88 | HR 88 | Temp 98.1°F | Resp 16 | Wt 187.8 lb

## 2013-07-25 DIAGNOSIS — J329 Chronic sinusitis, unspecified: Secondary | ICD-10-CM

## 2013-07-25 DIAGNOSIS — E559 Vitamin D deficiency, unspecified: Secondary | ICD-10-CM

## 2013-07-25 DIAGNOSIS — J309 Allergic rhinitis, unspecified: Secondary | ICD-10-CM

## 2013-07-25 DIAGNOSIS — E785 Hyperlipidemia, unspecified: Secondary | ICD-10-CM

## 2013-07-25 DIAGNOSIS — I10 Essential (primary) hypertension: Secondary | ICD-10-CM

## 2013-07-25 DIAGNOSIS — E119 Type 2 diabetes mellitus without complications: Secondary | ICD-10-CM | POA: Diagnosis not present

## 2013-07-25 DIAGNOSIS — Z79899 Other long term (current) drug therapy: Secondary | ICD-10-CM | POA: Diagnosis not present

## 2013-07-25 DIAGNOSIS — J322 Chronic ethmoidal sinusitis: Secondary | ICD-10-CM

## 2013-07-25 LAB — HEMOGLOBIN A1C
Hgb A1c MFr Bld: 5.5 % (ref <5.7)
MEAN PLASMA GLUCOSE: 111 mg/dL (ref <117)

## 2013-07-25 MED ORDER — BISOPROLOL-HYDROCHLOROTHIAZIDE 2.5-6.25 MG PO TABS: 1.0000 | ORAL_TABLET | Freq: Every day | ORAL | Status: DC

## 2013-07-25 MED ORDER — BISOPROLOL-HYDROCHLOROTHIAZIDE 5-6.25 MG PO TABS: 1.0000 | ORAL_TABLET | Freq: Every day | ORAL | Status: DC

## 2013-07-25 MED ORDER — AZELASTINE HCL 0.1 % NA SOLN: 2.0000 | Freq: Two times a day (BID) | NASAL | Status: DC

## 2013-07-25 MED ORDER — LEVOFLOXACIN 500 MG PO TABS: 500.0000 mg | ORAL_TABLET | Freq: Every day | ORAL | Status: AC

## 2013-07-25 NOTE — Patient Instructions (Signed)

## 2013-07-25 NOTE — Progress Notes (Signed)
Patient ID: [name], male   DOB: 04/01/1946, 68 y.o.   MRN: [id number]   This very nice 68 y.o. MWM presents for 3 month follow up with Hypertension, ASHD/stents, Hyperlipidemia, Pre-Diabetes and Vitamin D Deficiency. He also has Hx/o chronic recurrent sinusitis and has recently been treated with z- pak x 2 with persissant Rt maxillary tenderness and pressure.   HTN predates since 1998. BP has been controlled at home. Today's BP: 144/88 mmHg. Apparently he has stopped his Metoprolol in the interim since his last OV. In 1999 he had PTCA and stents x 2 and this was followed by laser angioplasty at [place]. In 2012 he had a cath showing patent grafts. Patient denies any cardiac type chest pain, palpitations, dyspnea/orthopnea/PND, dizziness, claudication, or dependent edema.   Hyperlipidemia is not best controlled with diet & he is known intolerant to statins. He has been on Zetia in the past, but apparently is off of it since his last OV. Patient denies myalgias or other med SE's.   Lab Results  Component Value Date   CHOL 184 06/03/2013   HDL 39.30 06/03/2013   LDLCALC 103* 05/16/2013   LDLDIRECT 116.0 06/03/2013   TRIG 280.0* 06/03/2013   CHOLHDL 5 06/03/2013    Also, the patient has history of PreDiabetes since 1998 which has been diet controlled and last A1c was 5.3% in August 2014. Patient denies any symptoms of reactive hypoglycemia, diabetic polys, paresthesias or visual blurring.   Further, Patient has history of Vitamin D Deficiency of 45 in 2008 and with a last vitamin D of 77 in Aug 2014. Patient supplements vitamin D without any suspected side-effects.    Medication List         aspirin EC 81 MG tablet  Take 81 mg by mouth daily.     azelastine 137 MCG/SPRAY nasal spray  Commonly known as:  ASTELIN  Place 2 sprays into both nostrils 2 (two) times daily. Use in each nostril as directed     B COMPLEX-VITAMIN B12 PO  Take 1 tablet by mouth daily.     bisoprolol-hydrochlorothiazide 5-6.25 MG per tablet  Commonly known as:  ZIAC  Take 1 tablet by mouth daily. For BP and Heart     clopidogrel 75 MG tablet  Commonly known as:  PLAVIX  Take 75 mg by mouth daily after breakfast.     Co Q 10 100 MG Caps  Take 100 mg by mouth daily.     fish oil-omega-3 fatty acids 1000 MG capsule  Take 2 g by mouth daily.     Flax Seed Oil 1000 MG Caps  Take 1,000 mg by mouth 2 (two) times daily.     fluocinonide 0.05 % external solution  Commonly known as:  LIDEX     levofloxacin 500 MG tablet  Commonly known as:  LEVAQUIN  Take 1 tablet (500 mg total) by mouth daily. For chronic sinusitis     lisinopril 20 MG tablet  Commonly known as:  PRINIVIL,ZESTRIL  Take 20 mg by mouth daily.     Magnesium 500 MG Tabs  Take 500 mg by mouth 2 (two) times daily.     Melatonin 3 MG Caps  Take 10 mg by mouth 2 (two) times daily.     multivitamin capsule  Take 1 capsule by mouth daily.     naproxen sodium 220 MG tablet  Commonly known as:  ANAPROX  Take 220 mg by mouth daily as needed.  nitroGLYCERIN 0.4 MG SL tablet  Commonly known as:  NITROSTAT  Place 0.4 mg under the tongue every 5 (five) minutes as needed. For chest pain.     OVER THE COUNTER MEDICATION  Takes ALA(alpha lipoic)     Turmeric 500 MG Caps  Take 500 mg by mouth daily.     Valerian 400 MG Caps  Take 400 mg by mouth at bedtime.         Allergies  Allergen Reactions  . Adhesive [Tape]     SKIN PEELS  . Codeine Itching    Also hallucinations   . Crestor [Rosuvastatin]   . Cymbalta [Duloxetine Hcl]   . Dilaudid [Hydromorphone Hcl]   . Effexor [Venlafaxine]   . Morphine And Related Other (See Comments)    Hallucinations  . Nsaids Other (See Comments)    BLEEDING  . Percocet [Oxycodone-Acetaminophen]   . Sulfa Antibiotics   . Topamax [Topiramate]   . Trazodone And Nefazodone   . Penicillins Rash    PMHx:   Past Medical History  Diagnosis Date  . CAD  (coronary artery disease)     pt last left heart cath was in Nov 2008. EF was 55% on left ventriculogram. Circumflex was totally occluded after the 1st obtuse marginal with collaterals supplying the distal circumflex. LAD showed luminal irregularities. The stent in LAD was patent. The RCA showed luminal irregularities. The stent in th emid RCA and PDA were patent. There were no inverventions.   . Obesity   . Low back pain     chronic  . GERD (gastroesophageal reflux disease)     hiatal hernia. Patient did hav ea Nissen fundoplication  . Peptic ulcer disease   . DM2 (diabetes mellitus, type 2)     well controlled  . Spinal fracture     hx of traumatic in Jan 2009 after falling off the roof  . Chronic obstructive pulmonary disease     asthma  . Benign prostatic hypertrophy   . Osteoarthritis   . Anxiety     treated /w Xanax for mild depression , using 2 times per day, not PRN  . Depression   . Asthma   . Sleep concern     states the study (2003 )was done at [facility]., it failed & he was told to return & he never did. Pt. states he has been found by prev. hosp. staff that he has to be told when to breathe & his wife does the same.   . Cluster headache     relative to sinus problems   . Cluster headache   . History of blood transfusion     need for Bld. transfusion relative to taking NSAIDS  . Cancer     basal cell- face & head  . 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 HTN (hypertension)     pt. followed by [facility], last cardiac visit 2012, one yr. ago    FHx:    Reviewed / unchanged  SHx:    Reviewed / unchanged  Systems Review: Constitutional: Denies fever, chills, wt changes, headaches, insomnia, fatigue, night sweats, change in appetite. Eyes: Denies redness, blurred vision, diplopia, discharge, itchy, watery eyes.  ENT: Denies discharge, post nasal drip, epistaxis, sore throat, earache, hearing loss,  dental pain, tinnitus, vertigo,  snoring. Has sinus Sx's as above. CV: Denies chest pain, palpitations, irregular heartbeat, syncope, dyspnea, diaphoresis, orthopnea, PND,  claudication, edema. Respiratory: denies cough, dyspnea, DOE, pleurisy, hoarseness, laryngitis, wheezing.  Gastrointestinal: Denies dysphagia, odynophagia, heartburn, reflux, water brash, abdominal pain or cramps, nausea, vomiting, bloating, diarrhea, constipation, hematemesis, melena, hematochezia,  or hemorrhoids. Genitourinary: Denies dysuria, frequency, urgency, nocturia, hesitancy, discharge, hematuria, flank pain. Musculoskeletal: Denies arthralgias, myalgias, stiffness, jt. swelling, pain, limp, strain/sprain.  Skin: Denies pruritus, rash, hives, warts, acne, eczema, change in skin lesion(s). Neuro: No weakness, tremor, incoordination, spasms, paresthesia, or pain. Psychiatric: Denies confusion, memory loss, or sensory loss. Endo: Denies change in weight, skin, hair change.  Heme/Lymph: No excessive bleeding, bruising, orenlarged lymph nodes.  BP: 144/88  Pulse: 88  Temp: 98.1 F (36.7 C)  Resp: 16    Estimated body mass index is 29.41 kg/(m^2) as calculated from the following:   Height as of 07/18/13: 5\' 7"  (1.702 m).   Weight as of this encounter: 187 lb 12.8 oz (85.186 kg).  On Exam: Appears well nourished - in no distress. Eyes: PERRLA, EOMs, conjunctiva no swelling or erythema. Sinuses: There is Rt maxillary tenderness. ENT/Mouth: EAC's clear, TM's nl w/o erythema, bulging. Nares clear w/o erythema, swelling, exudates. Oropharynx clear without erythema or exudates. Oral hygiene is good. Tongue normal, non obstructing. Hearing intact.  Neck: Supple. Thyroid nl. Car 2+/2+ without bruits, nodes or JVD. Chest: Respirations nl with BS clear & equal w/o rales, rhonchi, wheezing or stridor.  Cor: Heart sounds normal w/ regular rate and rhythm without sig. murmurs, gallops, clicks, or rubs. Peripheral pulses normal  and equal  without edema.  Abdomen: Soft & bowel sounds normal. Non-tender w/o guarding, rebound, hernias, masses, or organomegaly.  Lymphatics: Unremarkable.  Musculoskeletal: Full ROM all peripheral extremities, joint stability, 5/5 strength, and normal gait.  Skin: Warm, dry without exposed rashes, lesions, ecchymosis apparent.  Neuro: Cranial nerves intact, reflexes equal bilaterally. Sensory-motor testing grossly intact. Tendon reflexes grossly intact.  Pysch: Alert & oriented x 3. Insight and judgement nl & appropriate. No ideations.  Assessment and Plan:  1. Hypertension - Continue monitor blood pressure at home. Continue diet and re-add a beta blocker - Rx Ziac-5  1qd.  2. Hyperlipidemia - Continue diet/meds, exercise,& lifestyle modifications. Continue monitor periodic cholesterol/liver & renal functions   3. Pre-diabetes/Insulin Resistance - Continue diet, exercise, lifestyle modifications. Monitor appropriate labs.  4. Vitamin D Deficiency - Continue supplementation.  5. Recurrent/Chronic Sinusitis - Rx Levaquin 500 mg #30 1 qd  Recommended regular exercise, BP monitoring, weight control, and discussed med and SE's. Recommended labs to assess and monitor clinical status. Further disposition pending results of labs.

## 2013-07-26 LAB — CBC WITH DIFFERENTIAL/PLATELET
BASOS ABS: 0 10*3/uL (ref 0.0–0.1)
Basophils Relative: 1 % (ref 0–1)
Eosinophils Absolute: 0.1 10*3/uL (ref 0.0–0.7)
Eosinophils Relative: 2 % (ref 0–5)
HCT: 45.6 % (ref 39.0–52.0)
Hemoglobin: 15.8 g/dL (ref 13.0–17.0)
LYMPHS PCT: 31 % (ref 12–46)
Lymphs Abs: 1.6 10*3/uL (ref 0.7–4.0)
MCH: 30.3 pg (ref 26.0–34.0)
MCHC: 34.6 g/dL (ref 30.0–36.0)
MCV: 87.5 fL (ref 78.0–100.0)
MONOS PCT: 10 % (ref 3–12)
Monocytes Absolute: 0.5 10*3/uL (ref 0.1–1.0)
Neutro Abs: 2.9 10*3/uL (ref 1.7–7.7)
Neutrophils Relative %: 56 % (ref 43–77)
Platelets: 203 10*3/uL (ref 150–400)
RBC: 5.21 MIL/uL (ref 4.22–5.81)
RDW: 14.3 % (ref 11.5–15.5)
WBC: 5.3 10*3/uL (ref 4.0–10.5)

## 2013-07-26 LAB — INSULIN, FASTING: INSULIN FASTING, SERUM: 28 u[IU]/mL (ref 3–28)

## 2013-07-26 LAB — BASIC METABOLIC PANEL WITH GFR
BUN: 21 mg/dL (ref 6–23)
CO2: 27 mEq/L (ref 19–32)
Calcium: 9.4 mg/dL (ref 8.4–10.5)
Chloride: 99 mEq/L (ref 96–112)
Creat: 0.87 mg/dL (ref 0.50–1.35)
GFR, EST NON AFRICAN AMERICAN: 88 mL/min
GFR, Est African American: >89 mL/min
Glucose, Bld: 75 mg/dL (ref 70–99)
Potassium: 4.3 mEq/L (ref 3.5–5.3)
Sodium: 138 mEq/L (ref 135–145)

## 2013-07-26 LAB — HEPATIC FUNCTION PANEL
ALT: 23 U/L (ref 0–53)
AST: 24 U/L (ref 0–37)
Albumin: 3.9 g/dL (ref 3.5–5.2)
Alkaline Phosphatase: 52 U/L (ref 39–117)
BILIRUBIN INDIRECT: 0.4 mg/dL (ref 0.2–1.2)
BILIRUBIN TOTAL: 0.5 mg/dL (ref 0.2–1.2)
Bilirubin, Direct: 0.1 mg/dL (ref 0.0–0.3)
TOTAL PROTEIN: 6.4 g/dL (ref 6.0–8.3)

## 2013-07-26 LAB — LIPID PANEL
CHOL/HDL RATIO: 3.9 ratio
Cholesterol: 180 mg/dL (ref 0–200)
HDL: 46 mg/dL (ref >39)
LDL CALC: 94 mg/dL (ref 0–99)
TRIGLYCERIDES: 198 mg/dL — AB (ref <150)
VLDL: 40 mg/dL (ref 0–40)

## 2013-07-26 LAB — MAGNESIUM: Magnesium: 1.7 mg/dL (ref 1.5–2.5)

## 2013-07-26 LAB — TSH: TSH: 0.944 u[IU]/mL (ref 0.350–4.500)

## 2013-07-26 LAB — VITAMIN D 25 HYDROXY (VIT D DEFICIENCY, FRACTURES): Vit D, 25-Hydroxy: 107 ng/mL — ABNORMAL HIGH (ref 30–89)

## 2013-07-29 DIAGNOSIS — C4441 Basal cell carcinoma of skin of scalp and neck: Secondary | ICD-10-CM | POA: Diagnosis not present

## 2013-09-16 ENCOUNTER — Other Ambulatory Visit: Payer: Self-pay | Admitting: Internal Medicine

## 2013-10-29 DIAGNOSIS — M5412 Radiculopathy, cervical region: Secondary | ICD-10-CM | POA: Diagnosis not present

## 2013-10-29 DIAGNOSIS — Z6830 Body mass index (BMI) 30.0-30.9, adult: Secondary | ICD-10-CM | POA: Diagnosis not present

## 2013-10-30 ENCOUNTER — Other Ambulatory Visit: Payer: Self-pay | Admitting: Neurosurgery

## 2013-10-30 DIAGNOSIS — M5010 Cervical disc disorder with radiculopathy, unspecified cervical region: Secondary | ICD-10-CM

## 2013-11-01 ENCOUNTER — Ambulatory Visit
Admission: RE | Admit: 2013-11-01 | Discharge: 2013-11-01 | Disposition: A | Discharge status: Home / Self Care | Payer: Medicare Other | Source: Ambulatory Visit | Attending: Neurosurgery | Admitting: Neurosurgery

## 2013-11-01 ENCOUNTER — Inpatient Hospital Stay: Admission: RE | Admit: 2013-11-01 | Payer: Medicare Other | Source: Ambulatory Visit

## 2013-11-01 DIAGNOSIS — M5010 Cervical disc disorder with radiculopathy, unspecified cervical region: Secondary | ICD-10-CM

## 2013-11-01 DIAGNOSIS — M47812 Spondylosis without myelopathy or radiculopathy, cervical region: Secondary | ICD-10-CM | POA: Diagnosis not present

## 2013-11-01 DIAGNOSIS — M8448XA Pathological fracture, other site, initial encounter for fracture: Secondary | ICD-10-CM | POA: Diagnosis not present

## 2013-11-01 DIAGNOSIS — M5030 Other cervical disc degeneration, unspecified cervical region: Secondary | ICD-10-CM | POA: Diagnosis not present

## 2013-11-05 DIAGNOSIS — T8489XA Other specified complication of internal orthopedic prosthetic devices, implants and grafts, initial encounter: Secondary | ICD-10-CM | POA: Diagnosis not present

## 2013-11-05 DIAGNOSIS — M5412 Radiculopathy, cervical region: Secondary | ICD-10-CM | POA: Diagnosis not present

## 2013-11-05 DIAGNOSIS — Z6837 Body mass index (BMI) 37.0-37.9, adult: Secondary | ICD-10-CM | POA: Diagnosis not present

## 2013-11-07 ENCOUNTER — Ambulatory Visit: Payer: Self-pay | Admitting: Physician Assistant

## 2013-11-27 ENCOUNTER — Encounter: Payer: Self-pay | Admitting: Physician Assistant

## 2013-11-27 ENCOUNTER — Ambulatory Visit (INDEPENDENT_AMBULATORY_CARE_PROVIDER_SITE_OTHER): Payer: Medicare Other | Admitting: Physician Assistant

## 2013-11-27 VITALS — BP 122/70 | HR 64 | Temp 98.1°F | Resp 16 | Wt 195.0 lb

## 2013-11-27 DIAGNOSIS — Z9181 History of falling: Secondary | ICD-10-CM

## 2013-11-27 DIAGNOSIS — I10 Essential (primary) hypertension: Secondary | ICD-10-CM

## 2013-11-27 DIAGNOSIS — E785 Hyperlipidemia, unspecified: Secondary | ICD-10-CM

## 2013-11-27 DIAGNOSIS — E669 Obesity, unspecified: Secondary | ICD-10-CM

## 2013-11-27 DIAGNOSIS — Z79899 Other long term (current) drug therapy: Secondary | ICD-10-CM | POA: Diagnosis not present

## 2013-11-27 DIAGNOSIS — Z0000 Encounter for general adult medical examination without abnormal findings: Secondary | ICD-10-CM

## 2013-11-27 DIAGNOSIS — E559 Vitamin D deficiency, unspecified: Secondary | ICD-10-CM

## 2013-11-27 DIAGNOSIS — E119 Type 2 diabetes mellitus without complications: Secondary | ICD-10-CM

## 2013-11-27 DIAGNOSIS — J449 Chronic obstructive pulmonary disease, unspecified: Secondary | ICD-10-CM

## 2013-11-27 DIAGNOSIS — Z1331 Encounter for screening for depression: Secondary | ICD-10-CM

## 2013-11-27 DIAGNOSIS — I2510 Atherosclerotic heart disease of native coronary artery without angina pectoris: Secondary | ICD-10-CM

## 2013-11-27 MED ORDER — CLOBETASOL PROPIONATE 0.05 % EX SOLN: 1.0000 "application " | Freq: Every day | CUTANEOUS | Status: DC

## 2013-11-27 NOTE — Patient Instructions (Signed)
Preventative Care for Adults, Male       REGULAR HEALTH EXAMS:  A routine yearly physical is a good way to check in with your primary care provider about your health and preventive screening. It is also an opportunity to share updates about your health and any concerns you have, and receive a thorough all-over exam.   Most health insurance companies pay for at least some preventative services.  Check with your health plan for specific coverages.  WHAT PREVENTATIVE SERVICES DO MEN NEED?  Adult men should have their weight and blood pressure checked regularly.   Men age 35 and older should have their cholesterol levels checked regularly.  Beginning at age 50 and continuing to age 75, men should be screened for colorectal cancer.  Certain people should may need continued testing until age 85.  Other cancer screening may include exams for testicular and prostate cancer.  Updating vaccinations is part of preventative care.  Vaccinations help protect against diseases such as the flu.  Lab tests are generally done as part of preventative care to screen for anemia and blood disorders, to screen for problems with the kidneys and liver, to screen for bladder problems, to check blood sugar, and to check your cholesterol level.  Preventative services generally include counseling about diet, exercise, avoiding tobacco, drugs, excessive alcohol consumption, and sexually transmitted infections.    GENERAL RECOMMENDATIONS FOR GOOD HEALTH:  Healthy diet:  Eat a variety of foods, including fruit, vegetables, animal or vegetable protein, such as meat, fish, chicken, and eggs, or beans, lentils, tofu, and grains, such as rice.  Drink plenty of water daily.  Decrease saturated fat in the diet, avoid lots of red meat, processed foods, sweets, fast foods, and fried foods.  Exercise:  Aerobic exercise helps maintain good heart health. At least 30-40 minutes of moderate-intensity exercise is recommended.  For example, a brisk walk that increases your heart rate and breathing. This should be done on most days of the week.   Find a type of exercise or a variety of exercises that you enjoy so that it becomes a part of your daily life.  Examples are running, walking, swimming, water aerobics, and biking.  For motivation and support, explore group exercise such as aerobic class, spin class, Zumba, Yoga,or  martial arts, etc.    Set exercise goals for yourself, such as a certain weight goal, walk or run in a race such as a 5k walk/run.  Speak to your primary care provider about exercise goals.  Disease prevention:  If you smoke or chew tobacco, find out from your caregiver how to quit. It can literally save your life, no matter how long you have been a tobacco user. If you do not use tobacco, never begin.   Maintain a healthy diet and normal weight. Increased weight leads to problems with blood pressure and diabetes.   The Body Mass Index or BMI is a way of measuring how much of your body is fat. Having a BMI above 27 increases the risk of heart disease, diabetes, hypertension, stroke and other problems related to obesity. Your caregiver can help determine your BMI and based on it develop an exercise and dietary program to help you achieve or maintain this important measurement at a healthful level.  High blood pressure causes heart and blood vessel problems.  Persistent high blood pressure should be treated with medicine if weight loss and exercise do not work.   Fat and cholesterol leaves deposits in your arteries   that can block them. This causes heart disease and vessel disease elsewhere in your body.  If your cholesterol is found to be high, or if you have heart disease or certain other medical conditions, then you may need to have your cholesterol monitored frequently and be treated with medication.   Ask if you should have a stress test if your history suggests this. A stress test is a test done on  a treadmill that looks for heart disease. This test can find disease prior to there being a problem.  Avoid drinking alcohol in excess (more than two drinks per day).  Avoid use of street drugs. Do not share needles with anyone. Ask for professional help if you need assistance or instructions on stopping the use of alcohol, cigarettes, and/or drugs.  Brush your teeth twice a day with fluoride toothpaste, and floss once a day. Good oral hygiene prevents tooth decay and gum disease. The problems can be painful, unattractive, and can cause other health problems. Visit your dentist for a routine oral and dental check up and preventive care every 6-12 months.   Look at your skin regularly.  Use a mirror to look at your back. Notify your caregivers of changes in moles, especially if there are changes in shapes, colors, a size larger than a pencil eraser, an irregular border, or development of new moles.  Safety:  Use seatbelts 100% of the time, whether driving or as a passenger.  Use safety devices such as hearing protection if you work in environments with loud noise or significant background noise.  Use safety glasses when doing any work that could send debris in to the eyes.  Use a helmet if you ride a bike or motorcycle.  Use appropriate safety gear for contact sports.  Talk to your caregiver about gun safety.  Use sunscreen with a SPF (or skin protection factor) of 15 or greater.  Lighter skinned people are at a greater risk of skin cancer. Don't forget to also wear sunglasses in order to protect your eyes from too much damaging sunlight. Damaging sunlight can accelerate cataract formation.   Practice safe sex. Use condoms. Condoms are used for birth control and to help reduce the spread of sexually transmitted infections (or STIs).  Some of the STIs are gonorrhea (the clap), chlamydia, syphilis, trichomonas, herpes, HPV (human papilloma virus) and HIV (human immunodeficiency virus) which causes AIDS.  The herpes, HIV and HPV are viral illnesses that have no cure. These can result in disability, cancer and death.   Keep carbon monoxide and smoke detectors in your home functioning at all times. Change the batteries every 6 months or use a model that plugs into the wall.   Vaccinations:  Stay up to date with your tetanus shots and other required immunizations. You should have a booster for tetanus every 10 years. Be sure to get your flu shot every year, since 5%-20% of the [place] population comes down with the flu. The flu vaccine changes each year, so being vaccinated once is not enough. Get your shot in the fall, before the flu season peaks.   Other vaccines to consider:  Pneumococcal vaccine to protect against certain types of pneumonia.  This is normally recommended for adults age 65 or older.  However, adults younger than 70 years old with certain underlying conditions such as diabetes, heart or lung disease should also receive the vaccine.  Shingles vaccine to protect against Varicella Zoster if you are older than age 60, or younger   than 70 years old with certain underlying illness.  Hepatitis A vaccine to protect against a form of infection of the liver by a virus acquired from food.  Hepatitis B vaccine to protect against a form of infection of the liver by a virus acquired from blood or body fluids, particularly if you work in health care.  If you plan to travel internationally, check with your local health department for specific vaccination recommendations.  Cancer Screening:  Most routine colon cancer screening begins at the age of 50. On a yearly basis, doctors may provide special easy to use take-home tests to check for hidden blood in the stool. Sigmoidoscopy or colonoscopy can detect the earliest forms of colon cancer and is life saving. These tests use a small camera at the end of a tube to directly examine the colon. Speak to your caregiver about this at age 50, when routine  screening begins (and is repeated every 5 years unless early forms of pre-cancerous polyps or small growths are found).   At the age of 50 men usually start screening for prostate cancer every year. Screening may begin at a younger age for those with higher risk. Those at higher risk include African-Americans or having a family history of prostate cancer. There are two types of tests for prostate cancer:   Prostate-specific antigen (PSA) testing. Recent studies raise questions about prostate cancer using PSA and you should discuss this with your caregiver.   Digital rectal exam (in which your doctor's lubricated and gloved finger feels for enlargement of the prostate through the anus).   Screening for testicular cancer.  Do a monthly exam of your testicles. Gently roll each testicle between your thumb and fingers, feeling for any abnormal lumps. The best time to do this is after a hot shower or bath when the tissues are looser. Notify your caregivers of any lumps, tenderness or changes in size or shape immediately.     

## 2013-11-27 NOTE — Progress Notes (Signed)
MEDICARE ANNUAL WELLNESS VISIT AND FOLLOW UP Assessment:   1. HYPERTENSION, UNSPECIFIED - CBC with Differential - BASIC METABOLIC PANEL WITH GFR - Hepatic function panel - TSH  2. CAD Cont follow up with Dr. Marigene Ehlers, control HTN, sugars, chol.   3. COPD Follow up as needed  4. DIABETES MELLITUS, TYPE II Discussed general issues about diabetes pathophysiology and management., Educational material distributed., Suggested low cholesterol diet., Encouraged aerobic exercise., Discussed foot care., Reminded to get yearly retinal exam. - Hemoglobin A1c - Insulin, fasting - HM DIABETES FOOT EXAM  5. HYPERLIPIDEMIA - Lipid panel  6. OBESITY Obesity with co morbidities- long discussion about weight loss, diet, and exercise  7. Vitamin D Deficiency  8. Encounter for long-term (current) use of other medications - Magnesium   Plan:   During the course of the visit the patient was educated and counseled about appropriate screening and preventive services including:    Pneumococcal vaccine   Influenza vaccine  Td vaccine  Screening electrocardiogram  Bone densitometry screening  Colorectal cancer screening  Diabetes screening  Glaucoma screening  Nutrition counseling   Screening recommendations, referrals: Vaccinations: Tdap vaccine not indicated Influenza vaccine not indicated Pneumococcal vaccine not indicated Shingles vaccine not indicated Hep B vaccine not indicated  Nutrition assessed and recommended  Colonoscopy up to date Recommended yearly ophthalmology/optometry visit for glaucoma screening and checkup Recommended yearly dental visit for hygiene and checkup Advanced directives - requested  Conditions/risks identified: BMI: Discussed weight loss, diet, and increase physical activity.  Increase physical activity: AHA recommends 150 minutes of physical activity a week.  Medications reviewed Diabetes is at goal, ACE/ARB therapy: Yes. Urinary  Incontinence is not an issue: discussed non pharmacology and pharmacology options.  Fall risk: high- discussed PT, home fall assessment, medications.    Subjective:  [name] is a 68 y.o. male who presents for Medicare Annual Wellness Visit and 3 month follow up for HTN, hyperlipidemia, diabetes, and vitamin D Def.  Date of last medicare wellness visit was is unknown.  His blood pressure has been controlled at home, today their BP is BP: 122/70 mmHg He does workout. He denies chest pain, shortness of breath, dizziness.  He is on cholesterol medication and denies myalgias. His cholesterol is at goal. The cholesterol last visit was:   Lab Results  Component Value Date   CHOL 180 07/25/2013   HDL 46 07/25/2013   LDLCALC 94 07/25/2013   LDLDIRECT 116.0 06/03/2013   TRIG 198* 07/25/2013   CHOLHDL 3.9 07/25/2013   He has been working on diet and exercise for diabetes, and denies polydipsia, polyuria and visual disturbances. Last A1C in the office was:  Lab Results  Component Value Date   HGBA1C 5.5 07/25/2013   Patient is on Vitamin D supplement.   Complaining of possible rash/itching intermittent for several weeks, washes with vinegar in the shower and it helps.   Names of Other Physician/Practitioners you currently use: 1. [facility] here for primary care Patient Care Team: [name], MD as PCP - General (Internal Medicine) Claybon Jabs, MD as Consulting Physician (Urology) Josephina Gip, MD as Consulting Physician (Optometry) Larey Dresser, MD as Consulting Physician (Cardiology) Winfield Cunas., MD as Consulting Physician (Gastroenterology)  Medication Review: Current Outpatient Prescriptions on File Prior to Visit  Medication Sig Dispense Refill  . aspirin EC 81 MG tablet Take 81 mg by mouth daily.      . B Complex-Folic Acid (B COMPLEX-VITAMIN  B12 PO) Take 1 tablet by mouth daily.       .  bisoprolol-hydrochlorothiazide (ZIAC) 5-6.25 MG per tablet Take 1 tablet by mouth daily. For BP and Heart  90 tablet  99  . clopidogrel (PLAVIX) 75 MG tablet TAKE 1 TABLET BY MOUTH ONCE DAILY  30 tablet  3  . Coenzyme Q10 (CO Q 10) 100 MG CAPS Take 100 mg by mouth daily.       . fish oil-omega-3 fatty acids 1000 MG capsule Take 2 g by mouth daily.       . Flaxseed, Linseed, (FLAX SEED OIL) 1000 MG CAPS Take 1,000 mg by mouth 2 (two) times daily.       . fluocinonide (LIDEX) 0.05 % external solution       . lisinopril (PRINIVIL,ZESTRIL) 20 MG tablet Take 20 mg by mouth daily.      . Magnesium 500 MG TABS Take 500 mg by mouth 2 (two) times daily.      . Melatonin 3 MG CAPS Take 10 mg by mouth 2 (two) times daily.       . Multiple Vitamin (MULTIVITAMIN) capsule Take 1 capsule by mouth daily.        . naproxen sodium (ANAPROX) 220 MG tablet Take 220 mg by mouth daily as needed.      . nitroGLYCERIN (NITROSTAT) 0.4 MG SL tablet Place 0.4 mg under the tongue every 5 (five) minutes as needed. For chest pain.      Marland Kitchen OVER THE COUNTER MEDICATION Takes ALA(alpha lipoic)      . Turmeric 500 MG CAPS Take 500 mg by mouth daily.       . Valerian 400 MG CAPS Take 400 mg by mouth at bedtime.        No current facility-administered medications on file prior to visit.    Current Problems (verified) Patient Active Problem List   Diagnosis Date Noted  . Vitamin D Deficiency 07/25/2013  . Encounter for long-term (current) use of other medications 07/25/2013  . DIABETES MELLITUS, TYPE II 01/13/2009  . DIAB W/RENAL MANIFESTS TYPE I [JUV] NOT UNCNTRL 01/13/2009  . HYPERLIPIDEMIA 01/13/2009  . OBESITY 01/13/2009  . CLUSTER HEADACHE 01/13/2009  . HYPERTENSION, UNSPECIFIED 01/13/2009  . CAD 01/13/2009  . COPD 01/13/2009  . GASTROESOPHAGEAL REFLUX DISEASE 01/13/2009  . LOW BACK PAIN, CHRONIC 01/13/2009  . PEPTIC ULCER DISEASE, HX OF 01/13/2009  . BENIGN PROSTATIC HYPERTROPHY, HX OF 01/13/2009    Screening  Tests Health Maintenance  Topic Date Due  . Colonoscopy  10/29/1993  . Influenza Vaccine  01/11/2014  . Tetanus/tdap  06/13/2014  . Pneumococcal Polysaccharide Vaccine Age 54 And Over  Completed  . Zostavax  Completed    Immunization History  Administered Date(s) Administered  . Influenza Split 02/17/2012  . Pneumococcal Polysaccharide-23 01/19/2012  . Pneumococcal-Unspecified 06/13/1997  . Td 06/13/2004  . Zoster 06/14/2007    Preventative care: Last colonoscopy:2013 due 10 years  Prior vaccinations: TD or Tdap: 2006 Influenza: 2013  Pneumococcal: 2013 Shingles/Zostavax: 2009  History reviewed: allergies, current medications, past family history, past medical history, past social history, past surgical history and problem list   Risk Factors: Tobacco History  Substance Use Topics  . Smoking status: Former Smoker    Quit date: 02/14/1978  . Smokeless tobacco: Not on file     Comment: quit 35+ yrs ago as of 2012  . Alcohol Use: Yes     Comment: rare   He does not smoke.  Patient is  not a former smoker. Are there smokers in your home (other than you)?  No  Alcohol Current alcohol use: social drinker  Caffeine Current caffeine use: coffee 1-2 /day  Exercise Current exercise: walking  Nutrition/Diet Current diet: in general, a "healthy" diet    Cardiac risk factors: advanced age (older than 79 for men, 76 for women), diabetes mellitus, dyslipidemia, hypertension, male gender, obesity (BMI >= 30 kg/m2) and sedentary lifestyle.  Depression Screen (Note: if answer to either of the following is "Yes", a more complete depression screening is indicated)   Q1: Over the past two weeks, have you felt down, depressed or hopeless? No  Q2: Over the past two weeks, have you felt little interest or pleasure in doing things? No  Have you lost interest or pleasure in daily life? No  Do you often feel hopeless? No  Do you cry easily over simple problems? No  Activities  of Daily Living In your present state of health, do you have any difficulty performing the following activities?:  Driving? No Managing money?  No Feeding yourself? No Getting from bed to chair? No Climbing a flight of stairs? No Preparing food and eating?: No Bathing or showering? No Getting dressed: No Getting to the toilet? No Using the toilet:No Moving around from place to place: No In the past year have you fallen or had a near fall?:Yes x3, tripping and hurt right shoulder, no fracture   Are you sexually active?  No  Do you have more than one partner?  No  Vision Difficulties: No  Hearing Difficulties: No Do you often ask people to speak up or repeat themselves? No Do you experience ringing or noises in your ears? No Do you have difficulty understanding soft or whispered voices? No  Cognition  Do you feel that you have a problem with memory?Yes  Do you often misplace items? Yes  Do you feel safe at home?  Yes  Advanced directives Does patient have a [place]? Yes Does patient have a Living Will? Yes   Objective:   Blood pressure 122/70, pulse 64, temperature 98.1 F (36.7 C), resp. rate 16, weight 195 lb (88.451 kg). Body mass index is 30.53 kg/(m^2).  General appearance: alert, no distress, WD/WN, male Cognitive Testing  Alert? Yes  Normal Appearance?Yes  Oriented to person? Yes  Place? Yes   Time? Yes  Recall of three objects?  Yes  Can perform simple calculations? Yes  Displays appropriate judgment?Yes  Can read the correct time from a watch face?Yes  HEENT: normocephalic, sclerae anicteric, TMs pearly, nares patent, no discharge or erythema, pharynx normal Oral cavity: MMM, no lesions Neck: supple, no lymphadenopathy, no thyromegaly, no masses Heart: RRR, normal S1, S2, no murmurs Lungs: CTA bilaterally, no wheezes, rhonchi, or rales Abdomen: +bs, soft, non tender, non distended, no masses, no hepatomegaly, no  splenomegaly Musculoskeletal: nontender, no swelling, no obvious deformity Extremities: no edema, no cyanosis, no clubbing Pulses: 2+ symmetric, upper and lower extremities, normal cap refill Neurological: alert, oriented x 3, CN2-12 intact, strength normal upper extremities and lower extremities, sensation normal throughout, DTRs 2+ throughout, no cerebellar signs, gait normal Psychiatric: normal affect, behavior normal, pleasant   Medicare Attestation I have personally reviewed: The patient's medical and social history Their use of alcohol, tobacco or illicit drugs Their current medications and supplements The patient's functional ability including ADLs,fall risks, home safety risks, cognitive, and hearing and visual impairment Diet and physical activities Evidence for depression or mood  disorders  The patient's weight, height, BMI, and visual acuity have been recorded in the chart.  I have made referrals, counseling, and provided education to the patient based on review of the above and I have provided the patient with a written personalized care plan for preventive services.     Vicie Mutters, PA-C   11/27/2013

## 2013-11-28 LAB — CBC WITH DIFFERENTIAL/PLATELET
Basophils Absolute: 0.1 10*3/uL (ref 0.0–0.1)
Basophils Relative: 1 % (ref 0–1)
EOS ABS: 0.1 10*3/uL (ref 0.0–0.7)
Eosinophils Relative: 2 % (ref 0–5)
HEMATOCRIT: 46.2 % (ref 39.0–52.0)
HEMOGLOBIN: 15.7 g/dL (ref 13.0–17.0)
LYMPHS ABS: 1.7 10*3/uL (ref 0.7–4.0)
Lymphocytes Relative: 30 % (ref 12–46)
MCH: 30.4 pg (ref 26.0–34.0)
MCHC: 34 g/dL (ref 30.0–36.0)
MCV: 89.4 fL (ref 78.0–100.0)
MONOS PCT: 9 % (ref 3–12)
Monocytes Absolute: 0.5 10*3/uL (ref 0.1–1.0)
NEUTROS ABS: 3.3 10*3/uL (ref 1.7–7.7)
Neutrophils Relative %: 58 % (ref 43–77)
Platelets: 179 10*3/uL (ref 150–400)
RBC: 5.17 MIL/uL (ref 4.22–5.81)
RDW: 14.3 % (ref 11.5–15.5)
WBC: 5.7 10*3/uL (ref 4.0–10.5)

## 2013-11-28 LAB — LIPID PANEL
Cholesterol: 182 mg/dL (ref 0–200)
HDL: 43 mg/dL (ref >39)
LDL Cholesterol: 94 mg/dL (ref 0–99)
Total CHOL/HDL Ratio: 4.2 Ratio
Triglycerides: 226 mg/dL — ABNORMAL HIGH (ref <150)
VLDL: 45 mg/dL — AB (ref 0–40)

## 2013-11-28 LAB — HEPATIC FUNCTION PANEL
ALT: 20 U/L (ref 0–53)
AST: 27 U/L (ref 0–37)
Albumin: 3.9 g/dL (ref 3.5–5.2)
Alkaline Phosphatase: 54 U/L (ref 39–117)
BILIRUBIN DIRECT: 0.1 mg/dL (ref 0.0–0.3)
Indirect Bilirubin: 0.5 mg/dL (ref 0.2–1.2)
Total Bilirubin: 0.6 mg/dL (ref 0.2–1.2)
Total Protein: 6.7 g/dL (ref 6.0–8.3)

## 2013-11-28 LAB — HEMOGLOBIN A1C
HEMOGLOBIN A1C: 5.7 % — AB (ref <5.7)
Mean Plasma Glucose: 117 mg/dL — ABNORMAL HIGH (ref <117)

## 2013-11-28 LAB — BASIC METABOLIC PANEL WITH GFR
BUN: 20 mg/dL (ref 6–23)
CO2: 23 meq/L (ref 19–32)
Calcium: 9.2 mg/dL (ref 8.4–10.5)
Chloride: 99 mEq/L (ref 96–112)
Creat: 1.01 mg/dL (ref 0.50–1.35)
GFR, Est African American: 87 mL/min
GFR, Est Non African American: 75 mL/min
GLUCOSE: 82 mg/dL (ref 70–99)
POTASSIUM: 3.9 meq/L (ref 3.5–5.3)
SODIUM: 136 meq/L (ref 135–145)

## 2013-11-28 LAB — INSULIN, FASTING: INSULIN FASTING, SERUM: 16 u[IU]/mL (ref 3–28)

## 2013-11-28 LAB — MAGNESIUM: Magnesium: 1.8 mg/dL (ref 1.5–2.5)

## 2013-11-28 LAB — TSH: TSH: 1.287 u[IU]/mL (ref 0.350–4.500)

## 2013-12-05 ENCOUNTER — Ambulatory Visit (INDEPENDENT_AMBULATORY_CARE_PROVIDER_SITE_OTHER): Payer: Medicare Other | Admitting: Emergency Medicine

## 2013-12-05 ENCOUNTER — Encounter: Payer: Self-pay | Admitting: Emergency Medicine

## 2013-12-05 VITALS — BP 106/64 | HR 70 | Temp 98.2°F | Resp 16 | Ht 66.5 in | Wt 194.0 lb

## 2013-12-05 DIAGNOSIS — Z79899 Other long term (current) drug therapy: Secondary | ICD-10-CM

## 2013-12-05 DIAGNOSIS — IMO0001 Reserved for inherently not codable concepts without codable children: Secondary | ICD-10-CM

## 2013-12-05 DIAGNOSIS — J3489 Other specified disorders of nose and nasal sinuses: Secondary | ICD-10-CM

## 2013-12-05 DIAGNOSIS — R0981 Nasal congestion: Secondary | ICD-10-CM

## 2013-12-05 LAB — CBC WITH DIFFERENTIAL/PLATELET
BASOS PCT: 0 % (ref 0–1)
Basophils Absolute: 0 10*3/uL (ref 0.0–0.1)
EOS ABS: 0 10*3/uL (ref 0.0–0.7)
EOS PCT: 0 % (ref 0–5)
HCT: 44.9 % (ref 39.0–52.0)
Hemoglobin: 15.9 g/dL (ref 13.0–17.0)
Lymphocytes Relative: 16 % (ref 12–46)
Lymphs Abs: 1.2 10*3/uL (ref 0.7–4.0)
MCH: 31.2 pg (ref 26.0–34.0)
MCHC: 35.4 g/dL (ref 30.0–36.0)
MCV: 88.2 fL (ref 78.0–100.0)
Monocytes Absolute: 0.4 10*3/uL (ref 0.1–1.0)
Monocytes Relative: 6 % (ref 3–12)
NEUTROS PCT: 78 % — AB (ref 43–77)
Neutro Abs: 5.6 10*3/uL (ref 1.7–7.7)
PLATELETS: 161 10*3/uL (ref 150–400)
RBC: 5.09 MIL/uL (ref 4.22–5.81)
RDW: 13.7 % (ref 11.5–15.5)
WBC: 7.2 10*3/uL (ref 4.0–10.5)

## 2013-12-05 NOTE — Patient Instructions (Signed)
Tick Bite Information Ticks are insects that attach themselves to the skin. There are many types of ticks. Common types include wood ticks and deer ticks. Sometimes, ticks carry diseases that can make a person very ill. The most common places for ticks to attach themselves are the scalp, neck, armpits, waist, and groin.  HOW CAN YOU PREVENT TICK BITES? Take these steps to help prevent tick bites when you are outdoors:  Wear long sleeves and long pants.  Wear white clothes so you can see ticks more easily.  Tuck your pant legs into your socks.  If walking on a trail, stay in the middle of the trail to avoid brushing against bushes.  Avoid walking through areas with long grass.  Put bug spray on all skin that is showing and along boot tops, pant legs, and sleeve cuffs.  Check clothes, hair, and skin often and before going inside.  Brush off any ticks that are not attached.  Take a shower or bath as soon as possible after being outdoors. HOW SHOULD YOU REMOVE A TICK? Ticks should be removed as soon as possible to help prevent diseases. 1. If latex gloves are available, put them on before trying to remove a tick. 2. Use tweezers to grasp the tick as close to the skin as possible. You may also use curved forceps or a tick removal tool. Grasp the tick as close to its head as possible. Avoid grasping the tick on its body. 3. Pull gently upward until the tick lets go. Do not twist the tick or jerk it suddenly. This may break off the tick's head or mouth parts. 4. Do not squeeze or crush the tick's body. This could force disease-carrying fluids from the tick into your body. 5. After the tick is removed, wash the bite area and your hands with soap and water or alcohol. 6. Apply a small amount of antiseptic cream or ointment to the bite site. 7. Wash any tools that were used. Do not try to remove a tick by applying a hot match, petroleum jelly, or fingernail polish to the tick. These methods do  not work. They may also increase the chances of disease being spread from the tick bite. WHEN SHOULD YOU SEEK HELP? Contact your health care provider if you are unable to remove a tick or if a part of the tick breaks off in the skin. After a tick bite, you need to watch for signs and symptoms of diseases that can be spread by ticks. Contact your health care provider if you develop any of the following:  Fever.  Rash.  Redness and puffiness (swelling) in the area of the tick bite.  Tender, puffy lymph glands.  Watery poop (diarrhea).  Weight loss.  Cough.  Feeling more tired than normal (fatigue).  Muscle, joint, or bone pain.  Belly (abdominal) pain.  Headache.  Change in your level of consciousness.  Trouble walking or moving your legs.  Loss of feeling (numbness) in the legs.  Loss of movement (paralysis).  Shortness of breath.  Confusion.  Throwing up (vomiting) many times. Document Released: 08/24/2009 Document Revised: 01/30/2013 Document Reviewed: 11/07/2012 [facility] Patient Information 2015 [place], [place]. This information is not intended to replace advice given to you by your health care provider. Make sure you discuss any questions you have with your health care provider.

## 2013-12-05 NOTE — Progress Notes (Signed)
Subjective:    Patient ID: [name], male    DOB: August 12, 1945, 68 y.o.   MRN: [id number]  HPI Comments: 68 yo with tick bite on Sunday behind left knee.  Patient notes increasing redness and itching at site of bite but has improved with alcohol.   He has noted chronic sinus congestion with 3 ENT evaluations that have been negative. He notes when he washes out sinus he gets yellow/ green production. He d/c allergy medicines due to memory fog. He would like another antibiotic despite numerous antibiotics without relief in past.    Medication List       This list is accurate as of: 12/05/13  2:35 PM.  Always use your most recent med list.               aspirin EC 81 MG tablet  Take 81 mg by mouth daily.     B COMPLEX-VITAMIN B12 PO  Take 1 tablet by mouth daily.     bisoprolol-hydrochlorothiazide 5-6.25 MG per tablet  Commonly known as:  ZIAC  Take 1 tablet by mouth daily. For BP and Heart     clobetasol 0.05 % external solution  Commonly known as:  TEMOVATE  Apply 1 application topically daily.     clopidogrel 75 MG tablet  Commonly known as:  PLAVIX  TAKE 1 TABLET BY MOUTH ONCE DAILY     Co Q 10 100 MG Caps  Take 100 mg by mouth daily.     fish oil-omega-3 fatty acids 1000 MG capsule  Take 2 g by mouth daily.     Flax Seed Oil 1000 MG Caps  Take 1,000 mg by mouth 2 (two) times daily.     fluocinonide 0.05 % external solution  Commonly known as:  LIDEX     lisinopril 20 MG tablet  Commonly known as:  PRINIVIL,ZESTRIL  Take 20 mg by mouth daily.     Magnesium 500 MG Tabs  Take 500 mg by mouth 2 (two) times daily.     Melatonin 3 MG Caps  Take 10 mg by mouth 2 (two) times daily.     multivitamin capsule  Take 1 capsule by mouth daily.     naproxen sodium 220 MG tablet  Commonly known as:  ANAPROX  Take 220 mg by mouth daily as needed.     nitroGLYCERIN 0.4 MG SL tablet  Commonly known as:  NITROSTAT  Place 0.4 mg under the tongue every 5 (five)  minutes as needed. For chest pain.     OVER THE COUNTER MEDICATION  Takes ALA(alpha lipoic)     Turmeric 500 MG Caps  Take 500 mg by mouth daily.     Valerian 400 MG Caps  Take 400 mg by mouth at bedtime.       Allergies  Allergen Reactions  . Adhesive [Tape]     SKIN PEELS  . Codeine Itching    Also hallucinations   . Crestor [Rosuvastatin]   . Cymbalta [Duloxetine Hcl]   . Dilaudid [Hydromorphone Hcl]   . Effexor [Venlafaxine]   . Morphine And Related Other (See Comments)    Hallucinations  . Nsaids Other (See Comments)    BLEEDING  . Percocet [Oxycodone-Acetaminophen]   . Sulfa Antibiotics   . Topamax [Topiramate]   . Trazodone And Nefazodone   . Penicillins Rash   Past Medical History  Diagnosis Date  . CAD (coronary artery disease)     pt last left heart cath was  in Nov 2008. EF was 55% on left ventriculogram. Circumflex was totally occluded after the 1st obtuse marginal with collaterals supplying the distal circumflex. LAD showed luminal irregularities. The stent in LAD was patent. The RCA showed luminal irregularities. The stent in th emid RCA and PDA were patent. There were no inverventions.   . Obesity   . Low back pain     chronic  . GERD (gastroesophageal reflux disease)     hiatal hernia. Patient did hav ea Nissen fundoplication  . Peptic ulcer disease   . DM2 (diabetes mellitus, type 2)     well controlled  . Spinal fracture     hx of traumatic in Jan 2009 after falling off the roof  . Chronic obstructive pulmonary disease     asthma  . Benign prostatic hypertrophy   . Osteoarthritis   . Anxiety     treated /w Xanax for mild depression , using 2 times per day, not PRN  . Depression   . Asthma   . Sleep concern     states the study (2003 )was done at [facility]., it failed & he was told to return & he never did. Pt. states he has been found by prev. hosp. staff that he has to be told when to breathe & his wife does the same.   . Cluster headache      relative to sinus problems   . Cluster headache   . History of blood transfusion     need for Bld. transfusion relative to taking NSAIDS  . Cancer     basal cell- face & head  . 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 HTN (hypertension)     pt. followed by [facility], last cardiac visit 2012, one yr. ago      Review of Systems  HENT: Positive for congestion, postnasal drip and sinus pressure.   Skin: Positive for wound.  All other systems reviewed and are negative.  BP 106/64  Pulse 70  Temp(Src) 98.2 F (36.8 C) (Oral)  Resp 16  Ht 5' 6.5" (1.689 m)  Wt 194 lb (87.998 kg)  BMI 30.85 kg/m2     Objective:   Physical Exam  Nursing note and vitals reviewed. Constitutional: He is oriented to person, place, and time. He appears well-developed and well-nourished.  HENT:  Head: Normocephalic and atraumatic.  Right Ear: External ear normal.  Left Ear: External ear normal.  Nose: Nose normal.  Mouth/Throat: Oropharynx is clear and moist. No oropharyngeal exudate.  Eyes: Conjunctivae are normal.  Neck: Normal range of motion.  Cardiovascular: Normal rate, regular rhythm, normal heart sounds and intact distal pulses.   Pulmonary/Chest: Effort normal and breath sounds normal.  Abdominal: Soft.  Musculoskeletal: Normal range of motion.  Lymphadenopathy:    He has no cervical adenopathy.  Neurological: He is alert and oriented to person, place, and time.  Skin: Skin is warm and dry.  No obvious rash  Psychiatric: He has a normal mood and affect. Judgment normal.          Assessment & Plan:  1. Tick bite- ? Labs, w/c if SX increase or ER.   2. Chronic sinusitis- with multiple workups all neg, EXAM NEG today, check labs advise f/u ENT

## 2013-12-06 LAB — ROCKY MTN SPOTTED FVR ABS PNL(IGG+IGM)
RMSF IGG: 3.92 IV — AB
RMSF IgM: 0.24 IV

## 2013-12-06 LAB — LYME ABY, WSTRN BLT IGG & IGM W/BANDS
B BURGDORFERI IGG ABS (IB): NEGATIVE
B BURGDORFERI IGM ABS (IB): NEGATIVE
LYME DISEASE 28 KD IGG: NONREACTIVE
LYME DISEASE 41 KD IGG: NONREACTIVE
LYME DISEASE 41 KD IGM: NONREACTIVE
LYME DISEASE 58 KD IGG: NONREACTIVE
LYME DISEASE 93 KD IGG: NONREACTIVE
Lyme Disease 18 kD IgG: NONREACTIVE
Lyme Disease 23 kD IgG: NONREACTIVE
Lyme Disease 23 kD IgM: NONREACTIVE
Lyme Disease 30 kD IgG: NONREACTIVE
Lyme Disease 39 kD IgG: NONREACTIVE
Lyme Disease 39 kD IgM: NONREACTIVE
Lyme Disease 45 kD IgG: NONREACTIVE
Lyme Disease 66 kD IgG: NONREACTIVE

## 2013-12-08 ENCOUNTER — Other Ambulatory Visit: Payer: Self-pay | Admitting: Emergency Medicine

## 2013-12-08 MED ORDER — DOXYCYCLINE HYCLATE 100 MG PO TABS: 100.0000 mg | ORAL_TABLET | Freq: Two times a day (BID) | ORAL | Status: DC

## 2013-12-09 ENCOUNTER — Telehealth: Payer: Self-pay

## 2013-12-09 NOTE — Telephone Encounter (Signed)
Message copied by Nadyne Coombes on Mon Dec 09, 2013 11:19 AM ------      Message from: [place], Lenna Sciara R      Created: Sun Dec 08, 2013  4:04 PM       + RMSF we will send in DOXY please take with food AD ------

## 2013-12-09 NOTE — Telephone Encounter (Signed)
Left message for patient to return my call for lab results.

## 2014-01-15 ENCOUNTER — Other Ambulatory Visit: Payer: Self-pay | Admitting: Internal Medicine

## 2014-01-16 DIAGNOSIS — H04129 Dry eye syndrome of unspecified lacrimal gland: Secondary | ICD-10-CM | POA: Diagnosis not present

## 2014-01-16 DIAGNOSIS — E119 Type 2 diabetes mellitus without complications: Secondary | ICD-10-CM | POA: Diagnosis not present

## 2014-01-16 DIAGNOSIS — Z961 Presence of intraocular lens: Secondary | ICD-10-CM | POA: Diagnosis not present

## 2014-01-28 ENCOUNTER — Encounter: Payer: Self-pay | Admitting: Internal Medicine

## 2014-01-31 ENCOUNTER — Encounter: Payer: Self-pay | Admitting: Internal Medicine

## 2014-01-31 ENCOUNTER — Ambulatory Visit (INDEPENDENT_AMBULATORY_CARE_PROVIDER_SITE_OTHER): Payer: Medicare Other | Admitting: Internal Medicine

## 2014-01-31 ENCOUNTER — Ambulatory Visit: Payer: Self-pay | Admitting: Physician Assistant

## 2014-01-31 VITALS — BP 142/84 | HR 68 | Temp 98.6°F | Resp 16 | Ht 67.75 in | Wt 196.6 lb

## 2014-01-31 DIAGNOSIS — E1129 Type 2 diabetes mellitus with other diabetic kidney complication: Secondary | ICD-10-CM

## 2014-01-31 DIAGNOSIS — J4489 Other specified chronic obstructive pulmonary disease: Secondary | ICD-10-CM

## 2014-01-31 DIAGNOSIS — Z87898 Personal history of other specified conditions: Secondary | ICD-10-CM

## 2014-01-31 DIAGNOSIS — D485 Neoplasm of uncertain behavior of skin: Secondary | ICD-10-CM | POA: Diagnosis not present

## 2014-01-31 DIAGNOSIS — I2510 Atherosclerotic heart disease of native coronary artery without angina pectoris: Secondary | ICD-10-CM | POA: Diagnosis not present

## 2014-01-31 DIAGNOSIS — I10 Essential (primary) hypertension: Secondary | ICD-10-CM

## 2014-01-31 DIAGNOSIS — K279 Peptic ulcer, site unspecified, unspecified as acute or chronic, without hemorrhage or perforation: Secondary | ICD-10-CM

## 2014-01-31 DIAGNOSIS — E785 Hyperlipidemia, unspecified: Secondary | ICD-10-CM

## 2014-01-31 DIAGNOSIS — J449 Chronic obstructive pulmonary disease, unspecified: Secondary | ICD-10-CM

## 2014-01-31 DIAGNOSIS — K2100 Gastro-esophageal reflux disease with esophagitis, without bleeding: Secondary | ICD-10-CM

## 2014-01-31 DIAGNOSIS — B079 Viral wart, unspecified: Secondary | ICD-10-CM

## 2014-01-31 DIAGNOSIS — L820 Inflamed seborrheic keratosis: Secondary | ICD-10-CM

## 2014-01-31 DIAGNOSIS — E669 Obesity, unspecified: Secondary | ICD-10-CM

## 2014-01-31 DIAGNOSIS — M545 Low back pain, unspecified: Secondary | ICD-10-CM

## 2014-01-31 DIAGNOSIS — G44009 Cluster headache syndrome, unspecified, not intractable: Secondary | ICD-10-CM

## 2014-01-31 NOTE — Progress Notes (Signed)
   Subjective:    Patient ID: [name], male    DOB: 03-28-1946, 68 y.o.   MRN: [id number]  HPI very nice 68 yo MWM presents for evaluation of BP and occasionally feeling lightheaded. he's had no presyncopal episodesa nd no vertigo, palpitations, CP or dyspnea.   Also he relates concerns over several skin lesions (1) on his right thumb, (2) left wrist and several lesions noticed in his scalp in the right & left temporal scalp areas and also several noted over the left frontal scalp areas.  Meds,All, PMH -all reviewed and unchanged.  Review of Systems In addition to the HPI above,  No Headache, No changes with Vision or hearing,  No problems swallowing food or Liquids,  No Chest pain or productive Cough or Shortness of Breath,  No Abdominal pain, No Nausea or Vomitting, Bowel movements are regular,  No Blood in stool or Urine or dysuria,  No new skin rashes or bruises,  No new joints pains-aches,  No new weakness, tingling, numbness in any extremity,  No recent weight loss,  No polyuria, polydypsia or polyphagia,  No significant Mental Stressors.  A full 10 point Review of Systems was done, except as stated above, all other Review of Systems were negative  Objective:   Physical Exam BP 142/84  P 68  T 98.6 F   R 16  Ht 5' 7.75"   Wt 196 lb 9.6 oz  BMI 30.11  HEENT - Neg Neck - Supple. No Br JVD Chest - clear equal BS Cor - RRR w/o sig mgr Abd - benign MS & Neuro - wnl  Skin - Finds a (1) 3 x 4 mm filamentous Verrucae of the medial/mid right thumb and (2) a 3 mm firm rounded smooth grayish pink lesion of the left wrist (? molluscum contagiosum) over the radial head area.    Both lesions were prepped  and anesthetized locally with Marcaine 0.5% with Epi and then sharply excised with a # 10 blade and then deeply hyfrecated to destroy and residual tissue. Sterile bandages were applied.   Then (3-8) 6 lesions appearing to be irritated/ excoriated crusty brown raised  seborrheic keratoses measuring 3-5 x 6-8 mm over each right & left temporal scalp margins were treated with liq N2 by a triple freeze/thaw technique.    Lastly, 3 pink raised fleshy lesions ( suspected early BCE) over the left frontal scalp area were treated likewise with liq. N2.    Assessment & Plan:   1. Hypertension - continue to monitor at home   2. 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 Keratosis, inflamed seborrheic - Bilat temples (procedure 17003 x 4)   4. Verruca, R thumb (procedure 17000)  5. molluscum contagiosum, L wrist (procedure 17003)  Patient was instructed in wound care and to monitor the procedure sites and return if problems.

## 2014-02-16 ENCOUNTER — Other Ambulatory Visit: Payer: Self-pay | Admitting: Emergency Medicine

## 2014-03-03 ENCOUNTER — Ambulatory Visit (INDEPENDENT_AMBULATORY_CARE_PROVIDER_SITE_OTHER): Payer: Medicare Other | Admitting: Internal Medicine

## 2014-03-03 ENCOUNTER — Encounter: Payer: Self-pay | Admitting: Internal Medicine

## 2014-03-03 VITALS — BP 156/82 | HR 60 | Temp 98.0°F | Resp 16 | Ht 67.0 in | Wt 194.0 lb

## 2014-03-03 DIAGNOSIS — I2510 Atherosclerotic heart disease of native coronary artery without angina pectoris: Secondary | ICD-10-CM | POA: Diagnosis not present

## 2014-03-03 DIAGNOSIS — Z79899 Other long term (current) drug therapy: Secondary | ICD-10-CM

## 2014-03-03 DIAGNOSIS — I10 Essential (primary) hypertension: Secondary | ICD-10-CM | POA: Diagnosis not present

## 2014-03-03 DIAGNOSIS — E559 Vitamin D deficiency, unspecified: Secondary | ICD-10-CM

## 2014-03-03 DIAGNOSIS — Z125 Encounter for screening for malignant neoplasm of prostate: Secondary | ICD-10-CM

## 2014-03-03 DIAGNOSIS — Z789 Other specified health status: Secondary | ICD-10-CM

## 2014-03-03 DIAGNOSIS — Z1331 Encounter for screening for depression: Secondary | ICD-10-CM | POA: Diagnosis not present

## 2014-03-03 DIAGNOSIS — Z23 Encounter for immunization: Secondary | ICD-10-CM

## 2014-03-03 DIAGNOSIS — E1129 Type 2 diabetes mellitus with other diabetic kidney complication: Secondary | ICD-10-CM | POA: Diagnosis not present

## 2014-03-03 DIAGNOSIS — E782 Mixed hyperlipidemia: Secondary | ICD-10-CM

## 2014-03-03 DIAGNOSIS — Z889 Allergy status to unspecified drugs, medicaments and biological substances status: Secondary | ICD-10-CM

## 2014-03-03 DIAGNOSIS — Z1212 Encounter for screening for malignant neoplasm of rectum: Secondary | ICD-10-CM

## 2014-03-03 DIAGNOSIS — Z9109 Other allergy status, other than to drugs and biological substances: Secondary | ICD-10-CM | POA: Diagnosis not present

## 2014-03-03 LAB — CBC WITH DIFFERENTIAL/PLATELET
Basophils Absolute: 0 10*3/uL (ref 0.0–0.1)
Basophils Relative: 1 % (ref 0–1)
Eosinophils Absolute: 0.1 10*3/uL (ref 0.0–0.7)
Eosinophils Relative: 2 % (ref 0–5)
HEMATOCRIT: 47.6 % (ref 39.0–52.0)
Hemoglobin: 17 g/dL (ref 13.0–17.0)
LYMPHS PCT: 34 % (ref 12–46)
Lymphs Abs: 1.7 10*3/uL (ref 0.7–4.0)
MCH: 31.3 pg (ref 26.0–34.0)
MCHC: 35.7 g/dL (ref 30.0–36.0)
MCV: 87.5 fL (ref 78.0–100.0)
MONO ABS: 0.4 10*3/uL (ref 0.1–1.0)
Monocytes Relative: 9 % (ref 3–12)
Neutro Abs: 2.6 10*3/uL (ref 1.7–7.7)
Neutrophils Relative %: 54 % (ref 43–77)
Platelets: 168 10*3/uL (ref 150–400)
RBC: 5.44 MIL/uL (ref 4.22–5.81)
RDW: 13.8 % (ref 11.5–15.5)
WBC: 4.9 10*3/uL (ref 4.0–10.5)

## 2014-03-03 LAB — HEMOGLOBIN A1C
Hgb A1c MFr Bld: 6.1 % — ABNORMAL HIGH (ref <5.7)
Mean Plasma Glucose: 128 mg/dL — ABNORMAL HIGH (ref <117)

## 2014-03-03 NOTE — Progress Notes (Signed)
Patient ID: [name], male   DOB: 28-Dec-1945, 68 y.o.   MRN: [id number]  Annual Screening Comprehensive Examination  This very nice 68 y.o.male presents for complete physical.  Patient has been followed for HTN, T2_NIDDM  Prediabetes, Hyperlipidemia, and Vitamin D Deficiency.   HTN predates since 1998. Patient's BP has been controlled at home.Today's BP was 156/82 mmHg. In 1999 patient had Stents on 2 occasions followed by laser angioplasty at [facility]. Heart Cath in 2011 found patent grafts. Patient denies any cardiac symptoms as chest pain, palpitations, shortness of breath, dizziness or ankle swelling.   Patient's hyperlipidemia is controlled with diet and medications. Patient denies myalgias or other medication SE's. Last lipids were at goal - Total Chol 182; HDL Chol 43; LDL (calc) 94; Trig 226 on 11/27/2013.   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 In Apr 2014 A1c was 5.8 with an elevated insulin level of 80 demonstrating insulin resistance. Patient denies reactive hypoglycemic symptoms, visual blurring, diabetic polys, or paresthesias. Last A1c was 5.7% on 11/27/2013.   Finally, patient has history of Vitamin D Deficiency of    and last vitamin D was  107 on 07/25/2013 and he was advised to taper his dose.  Medication Sig  . aspirin EC 81 MG tablet Take 81 mg by mouth daily.  . B Complex-Folic Acid (B COMPLEX-VITAMIN B12 PO) Take 1 tablet by mouth daily.   . bisoprolol-hydrochlorothiazide (ZIAC) 5-6.25 MG per tablet Take 1 tablet by mouth daily. For BP and Heart  . clobetasol (TEMOVATE) 0.05 % external solution Apply 1 application topically daily.  . clopidogrel (PLAVIX) 75 MG tablet TAKE 1 TABLET BY MOUTH ONCE DAILY  . Coenzyme Q10 (CO Q 10) 100 MG CAPS Take 100 mg by mouth daily.   . fish oil-omega-3 fatty acids 1000 MG capsule Take 2 g by mouth daily.   . Flaxseed, Linseed, (FLAX SEED  OIL) 1000 MG CAPS Take 1,000 mg by mouth 2 (two) times daily.   . fluocinonide (LIDEX) 0.05 % external solution   . lisinopril (PRINIVIL,ZESTRIL) 20 MG tablet Take 20 mg by mouth daily.  . Magnesium 500 MG TABS Take 500 mg by mouth 2 (two) times daily.  . Melatonin 3 MG CAPS Take 10 mg by mouth 2 (two) times daily.   . Multiple Vitamin (MULTIVITAMIN) capsule Take 1 capsule by mouth daily.    . naproxen sodium (ANAPROX) 220 MG tablet Take 220 mg by mouth daily as needed.  . nitroGLYCERIN (NITROSTAT) 0.4 MG SL tablet Place 0.4 mg under the tongue every 5 (five) minutes as needed. For chest pain.  Marland Kitchen OVER THE COUNTER MEDICATION Takes ALA(alpha lipoic)  . Turmeric 500 MG CAPS Take 500 mg by mouth daily.   . Valerian 400 MG CAPS Take 400 mg by mouth at bedtime.     Allergies  Allergen Reactions  . Adhesive [Tape]     SKIN PEELS  . Codeine Itching    Also hallucinations   . Crestor [Rosuvastatin]   . Cymbalta [Duloxetine Hcl]   . Dilaudid [Hydromorphone Hcl]   . Effexor [Venlafaxine]   . Morphine And Related Other (See Comments)    Hallucinations  . Nsaids Other (See Comments)    BLEEDING  . Percocet [Oxycodone-Acetaminophen]   . Sulfa Antibiotics   . Topamax [Topiramate]   . Trazodone And Nefazodone   . Penicillins Rash   Past Medical History  Diagnosis  Date  . CAD (coronary artery disease)     pt last left heart cath was in Nov 2008. EF was 55% on left ventriculogram. Circumflex was totally occluded after the 1st obtuse marginal with collaterals supplying the distal circumflex. LAD showed luminal irregularities. The stent in LAD was patent. The RCA showed luminal irregularities. The stent in th emid RCA and PDA were patent. There were no inverventions.   . Obesity   . Low back pain     chronic  . GERD (gastroesophageal reflux disease)     hiatal hernia. Patient did hav ea Nissen fundoplication  . Peptic ulcer disease   . DM2 (diabetes mellitus, type 2)     well controlled  .  Spinal fracture     hx of traumatic in Jan 2009 after falling off the roof  . Chronic obstructive pulmonary disease     asthma  . Benign prostatic hypertrophy   . Osteoarthritis   . Anxiety     treated /w Xanax for mild depression , using 2 times per day, not PRN  . Depression   . Asthma   . Sleep concern     states the study (2003 )was done at [facility] it failed & he was told to return & he never did. Pt. states he has been found by prev. hosp. staff that he has to be told when to breathe & his wife does the same.   . Cluster headache     relative to sinus problems   . Cluster headache   . History of blood transfusion     need for Bld. transfusion relative to taking NSAIDS  . Cancer     basal cell- face & head  . 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 HTN (hypertension)     pt. followed by [facility], last cardiac visit 2012, one yr. ago   Past Surgical History  Procedure Laterality Date  . Hiatal hernia repair    . Lumbar spine surgery      x4  . Right temporal artery biopsy    . C5-t6 posterior fusion      with Oasis and radius screws  . Right carpal tunnel release    . Esophageal manometry    . Laparoscopic cholecystectomy      & IOC  . Umbilical hernia repair    . Multiple percutaneous coronary interventions    . Back surgery      x3- last surgery lumbar- 1998- / fusion   . Cataracts      cataracts removed- /w IOL - both eyes   . Blepheroplasty      both eyes  . Eye surgery    . Nasal sinus surgery      x2, sees Dr. [name], still having problems, states he uses Benadryl PRN- up to 5 times per day   . Cardiac catheterization      2008 & 2012 multiple stents    . Hardware removal  02/20/2012    Procedure: HARDWARE REMOVAL;  Surgeon: [name], MD;  Location: [facility];  Service: Neurosurgery;  Laterality: N/A;  Hardware Removal   Family History  Problem Relation Age of Onset  . Heart  failure Mother     in her 19s  . Coronary artery disease Father     developed in his 84s  . Heart attack Brother     in there  28s  . Heart attack Brother     in there 32s   History   Social History  . Marital Status: Married    Spouse Name: N/A    Number of Children: N/A  . Years of Education: N/A   Occupational History  . Not on file.   Social History Main Topics  . Smoking status: Former Smoker    Quit date: 02/14/1978  . Smokeless tobacco: Not on file  . Alcohol Use: Yes     Comment: rare  . Drug Use: No  . Sexual Activity: Not on file    ROS Constitutional: Denies fever, chills, weight loss/gain, headaches, insomnia, fatigue, night sweats or change in appetite. Eyes: Denies redness, blurred vision, diplopia, discharge, itchy or watery eyes.  ENT: Denies discharge, congestion, post nasal drip, epistaxis, sore throat, earache, hearing loss, dental pain, Tinnitus, Vertigo, Sinus pain or snoring.  Cardio: Denies chest pain, palpitations, irregular heartbeat, syncope, dyspnea, diaphoresis, orthopnea, PND, claudication or edema Respiratory: denies cough, dyspnea, DOE, pleurisy, hoarseness, laryngitis or wheezing.  Gastrointestinal: Denies dysphagia, heartburn, reflux, water brash, pain, cramps, nausea, vomiting, bloating, diarrhea, constipation, hematemesis, melena, hematochezia, jaundice or hemorrhoids Genitourinary: Denies dysuria, frequency, urgency, nocturia, discharge, hematuria or flank pain and reports some  Hesitancy. Musculoskeletal: Denies arthralgia, myalgia, stiffness, Jt. Swelling, pain, limp or strain/sprain. Denies Falls. Skin: Denies puritis, rash, hives, warts, acne, eczema or change in skin lesion Neuro: No weakness, tremor, incoordination, spasms, paresthesia or pain Psychiatric: Denies confusion, memory loss or sensory loss. Denies Depression. Endocrine: Denies change in weight, skin, hair change, nocturia, and paresthesia, diabetic polys, visual blurring or  hyper / hypo glycemic episodes.  Heme/Lymph: No excessive bleeding, bruising or enlarged lymph nodes.  Physical Exam  BP 156/82  Pulse 60  Temp(Src) 98 F (36.7 C) (Temporal)  Resp 16  Ht 5\' 7"  (1.702 m)  Wt 194 lb (87.998 kg)  BMI 30.38 kg/m2  General Appearance: Well nourished, in no apparent distress. Eyes: PERRLA, EOMs, conjunctiva no swelling or erythema, normal fundi and vessels. Sinuses: No frontal/maxillary tenderness ENT/Mouth: EACs patent / TMs  nl. Nares clear without erythema, swelling, mucoid exudates. Oral hygiene is good. No erythema, swelling, or exudate. Tongue normal, non-obstructing. Tonsils not swollen or erythematous. Hearing normal.  Neck: Supple, thyroid normal. No bruits, nodes or JVD. Respiratory: Respiratory effort normal.  BS equal and clear bilateral without rales, rhonci, wheezing or stridor. Cardio: Heart sounds are normal with regular rate and rhythm and no murmurs, rubs or gallops. Peripheral pulses are normal and equal bilaterally without edema. No aortic or femoral bruits. Chest: symmetric with normal excursions and percussion.  Abdomen: Flat, soft, with bowl sounds. Nontender, no guarding, rebound, hernias, masses, or organomegaly.  Lymphatics: Non tender without lymphadenopathy.  Genitourinary: No hernias.Testes nl. DRE - prostate nl for age - smooth & firm w/o nodules. Musculoskeletal: Full ROM all peripheral extremities, joint stability, 5/5 strength, and normal gait. Skin: Warm and dry without rashes, lesions, cyanosis, clubbing or  ecchymosis.  Neuro: Cranial nerves intact, reflexes equal bilaterally. Normal muscle tone, no cerebellar symptoms. Sensation intact.  Pysch: Awake and oriented X 3with normal affect, insight and judgment appropriate.  Assessment and Plan  1. Annual Screening Examination 2 ASCAD 3. Hypertension  4. Hyperlipidemia 5. T2_NIDDM w/Stage 2 CKD 6. Vitamin D Deficiency 7. Morbid Obesity (BMI 30.38)  Continue prudent  diet as discussed, weight control, BP monitoring, regular exercise, and medications as discussed.  Discussed med effects and SE's. Routine screening labs and tests as  requested with regular follow-up as recommended.

## 2014-03-03 NOTE — Patient Instructions (Signed)
Recommend the book "The END of DIETING" by Dr Baker Janus   and the book "The END of DIABETES " by Dr Excell Seltzer  At [email or web address] - get book & Audio CD's      Being diabetic has a  300% increased risk for heart attack, stroke, cancer, and alzheimer- type vascular dementia. It is very important that you work harder with diet by avoiding all foods that are white except chicken & fish. Avoid white rice (brown & wild rice is OK), white potatoes (sweetpotatoes in moderation is OK), White bread or wheat bread or anything made out of white flour like bagels, donuts, rolls, buns, biscuits, cakes, pastries, cookies, pizza crust, and pasta (made from white flour & egg whites) - vegetarian pasta or spinach or wheat pasta is OK. Multigrain breads like Arnold's or [employer], or multigrain sandwich thins or flatbreads.  Diet, exercise and weight loss can reverse and cure diabetes in the early stages.  Diet, exercise and weight loss is very important in the control and prevention of complications of diabetes which affects every system in your body, ie. Brain - dementia/stroke, eyes - glaucoma/blindness, heart - heart attack/heart failure, kidneys - dialysis, stomach - gastric paralysis, intestines - malabsorption, nerves - severe painful neuritis, circulation - gangrene & loss of a leg(s), and finally cancer and Alzheimers.    I recommend avoid fried & greasy foods,  sweets/candy, white rice (brown or wild rice or Quinoa is OK), white potatoes (sweet potatoes are OK) - anything made from white flour - bagels, doughnuts, rolls, buns, biscuits,white and wheat breads, pizza crust and traditional pasta made of white flour & egg white(vegetarian pasta or spinach or wheat pasta is OK).  Multi-grain bread is OK - like multi-grain flat bread or sandwich thins. Avoid alcohol in excess. Exercise is also important.    Eat all the vegetables you want - avoid meat, especially red meat and dairy - especially cheese.  Cheese  is the most concentrated form of trans-fats which is the worst thing to clog up our arteries. Veggie cheese is OK which can be found in the fresh produce section at [employer] or [employer] or [employer]  Preventive Care for Adults A healthy lifestyle and preventive care can promote health and wellness. Preventive health guidelines for men include the following key practices:  A routine yearly physical is a good way to check with your health care provider about your health and preventative screening. It is a chance to share any concerns and updates on your health and to receive a thorough exam.  Visit your dentist for a routine exam and preventative care every 6 months. Brush your teeth twice a day and floss once a day. Good oral hygiene prevents tooth decay and gum disease.  The frequency of eye exams is based on your age, health, family medical history, use of contact lenses, and other factors. Follow your health care provider's recommendations for frequency of eye exams.  Eat a healthy diet. Foods such as vegetables, fruits, whole grains, low-fat dairy products, and lean protein foods contain the nutrients you need without too many calories. Decrease your intake of foods high in solid fats, added sugars, and salt. Eat the right amount of calories for you.Get information about a proper diet from your health care provider, if necessary.  Regular physical exercise is one of the most important things you can do for your health. Most adults should get at least 150 minutes of moderate-intensity exercise (any activity that  increases your heart rate and causes you to sweat) each week. In addition, most adults need muscle-strengthening exercises on 2 or more days a week.  Maintain a healthy weight. The body mass index (BMI) is a screening tool to identify possible weight problems. It provides an estimate of body fat based on height and weight. Your health care provider can find your BMI and can help you  achieve or maintain a healthy weight.For adults 20 years and older:  A BMI below 18.5 is considered underweight.  A BMI of 18.5 to 24.9 is normal.  A BMI of 25 to 29.9 is considered overweight.  A BMI of 30 and above is considered obese.  Maintain normal blood lipids and cholesterol levels by exercising and minimizing your intake of saturated fat. Eat a balanced diet with plenty of fruit and vegetables. Blood tests for lipids and cholesterol should begin at age 20 and be repeated every 5 years. If your lipid or cholesterol levels are high, you are over 50, or you are at high risk for heart disease, you may need your cholesterol levels checked more frequently.Ongoing high lipid and cholesterol levels should be treated with medicines if diet and exercise are not working.  If you smoke, find out from your health care provider how to quit. If you do not use tobacco, do not start.  Lung cancer screening is recommended for adults aged 72-80 years who are at high risk for developing lung cancer because of a history of smoking. A yearly low-dose CT scan of the lungs is recommended for people who have at least a 30-pack-year history of smoking and are a current smoker or have quit within the past 15 years. A pack year of smoking is smoking an average of 1 pack of cigarettes a day for 1 year (for example: 1 pack a day for 30 years or 2 packs a day for 15 years). Yearly screening should continue until the smoker has stopped smoking for at least 15 years. Yearly screening should be stopped for people who develop a health problem that would prevent them from having lung cancer treatment.  If you choose to drink alcohol, do not have more than 2 drinks per day. One drink is considered to be 12 ounces (355 mL) of beer, 5 ounces (148 mL) of wine, or 1.5 ounces (44 mL) of liquor.  Avoid use of street drugs. Do not share needles with anyone. Ask for help if you need support or instructions about stopping the use of  drugs.  High blood pressure causes heart disease and increases the risk of stroke. Your blood pressure should be checked at least every 1-2 years. Ongoing high blood pressure should be treated with medicines, if weight loss and exercise are not effective.  If you are 28-64 years old, ask your health care provider if you should take aspirin to prevent heart disease.  Diabetes screening involves taking a blood sample to check your fasting blood sugar level. This should be done once every 3 years, after age 13, if you are within normal weight and without risk factors for diabetes. Testing should be considered at a younger age or be carried out more frequently if you are overweight and have at least 1 risk factor for diabetes.  Colorectal cancer can be detected and often prevented. Most routine colorectal cancer screening begins at the age of 78 and continues through age 56. However, your health care provider may recommend screening at an earlier age if you have risk  factors for colon cancer. On a yearly basis, your health care provider may provide home test kits to check for hidden blood in the stool. Use of a small camera at the end of a tube to directly examine the colon (sigmoidoscopy or colonoscopy) can detect the earliest forms of colorectal cancer. Talk to your health care provider about this at age 48, when routine screening begins. Direct exam of the colon should be repeated every 5-10 years through age 60, unless early forms of precancerous polyps or small growths are found.  People who are at an increased risk for hepatitis B should be screened for this virus. You are considered at high risk for hepatitis B if:  You were born in a country where hepatitis B occurs often. Talk with your health care provider about which countries are considered high risk.  Your parents were born in a high-risk country and you have not received a shot to protect against hepatitis B (hepatitis B vaccine).  You have  HIV or AIDS.  You use needles to inject street drugs.  You live with, or have sex with, someone who has hepatitis B.  You are a man who has sex with other men (MSM).  You get hemodialysis treatment.  You take certain medicines for conditions such as cancer, organ transplantation, and autoimmune conditions.  Hepatitis C blood testing is recommended for all people born from [birth year] through 1965 and any individual with known risks for hepatitis C.  Practice safe sex. Use condoms and avoid high-risk sexual practices to reduce the spread of sexually transmitted infections (STIs). STIs include gonorrhea, chlamydia, syphilis, trichomonas, herpes, HPV, and human immunodeficiency virus (HIV). Herpes, HIV, and HPV are viral illnesses that have no cure. They can result in disability, cancer, and death.  If you are at risk of being infected with HIV, it is recommended that you take a prescription medicine daily to prevent HIV infection. This is called preexposure prophylaxis (PrEP). You are considered at risk if:  You are a man who has sex with other men (MSM) and have other risk factors.  You are a heterosexual man, are sexually active, and are at increased risk for HIV infection.  You take drugs by injection.  You are sexually active with a partner who has HIV.  Talk with your health care provider about whether you are at high risk of being infected with HIV. If you choose to begin PrEP, you should first be tested for HIV. You should then be tested every 3 months for as long as you are taking PrEP.  A one-time screening for abdominal aortic aneurysm (AAA) and surgical repair of large AAAs by ultrasound are recommended for men ages 51 to 11 years who are current or former smokers.  Healthy men should no longer receive prostate-specific antigen (PSA) blood tests as part of routine cancer screening. Talk with your health care provider about prostate cancer screening.  Testicular cancer screening is  not recommended for adult males who have no symptoms. Screening includes self-exam, a health care provider exam, and other screening tests. Consult with your health care provider about any symptoms you have or any concerns you have about testicular cancer.  Use sunscreen. Apply sunscreen liberally and repeatedly throughout the day. You should seek shade when your shadow is shorter than you. Protect yourself by wearing long sleeves, pants, a wide-brimmed hat, and sunglasses year round, whenever you are outdoors.  Once a month, do a whole-body skin exam, using a mirror to look  at the skin on your back. Tell your health care provider about new moles, moles that have irregular borders, moles that are larger than a pencil eraser, or moles that have changed in shape or color.  Stay current with required vaccines (immunizations).  Influenza vaccine. All adults should be immunized every year.  Tetanus, diphtheria, and acellular pertussis (Td, Tdap) vaccine. An adult who has not previously received Tdap or who does not know his vaccine status should receive 1 dose of Tdap. This initial dose should be followed by tetanus and diphtheria toxoids (Td) booster doses every 10 years. Adults with an unknown or incomplete history of completing a 3-dose immunization series with Td-containing vaccines should begin or complete a primary immunization series including a Tdap dose. Adults should receive a Td booster every 10 years.  Varicella vaccine. An adult without evidence of immunity to varicella should receive 2 doses or a second dose if he has previously received 1 dose.  Human papillomavirus (HPV) vaccine. Males aged 29-21 years who have not received the vaccine previously should receive the 3-dose series. Males aged 22-26 years may be immunized. Immunization is recommended through the age of 26 years for any male who has sex with males and did not get any or all doses earlier. Immunization is recommended for any  person with an immunocompromised condition through the age of 29 years if he did not get any or all doses earlier. During the 3-dose series, the second dose should be obtained 4-8 weeks after the first dose. The third dose should be obtained 24 weeks after the first dose and 16 weeks after the second dose.  Zoster vaccine. One dose is recommended for adults aged 38 years or older unless certain conditions are present.  Measles, mumps, and rubella (MMR) vaccine. Adults born before [birth year] generally are considered immune to measles and mumps. Adults born in [birth year] or later should have 1 or more doses of MMR vaccine unless there is a contraindication to the vaccine or there is laboratory evidence of immunity to each of the three diseases. A routine second dose of MMR vaccine should be obtained at least 28 days after the first dose for students attending postsecondary schools, health care workers, or international travelers. People who received inactivated measles vaccine or an unknown type of measles vaccine during 1963-1967 should receive 2 doses of MMR vaccine. People who received inactivated mumps vaccine or an unknown type of mumps vaccine before 1979 and are at high risk for mumps infection should consider immunization with 2 doses of MMR vaccine. Unvaccinated health care workers born before [birth year] who lack laboratory evidence of measles, mumps, or rubella immunity or laboratory confirmation of disease should consider measles and mumps immunization with 2 doses of MMR vaccine or rubella immunization with 1 dose of MMR vaccine.  Pneumococcal 13-valent conjugate (PCV13) vaccine. When indicated, a person who is uncertain of his immunization history and has no record of immunization should receive the PCV13 vaccine. An adult aged 68 years or older who has certain medical conditions and has not been previously immunized should receive 1 dose of PCV13 vaccine. This PCV13 should be followed with a dose of pneumococcal  polysaccharide (PPSV23) vaccine. The PPSV23 vaccine dose should be obtained at least 8 weeks after the dose of PCV13 vaccine. An adult aged 95 years or older who has certain medical conditions and previously received 1 or more doses of PPSV23 vaccine should receive 1 dose of PCV13. The PCV13 vaccine dose should be obtained 1  or more years after the last PPSV23 vaccine dose.  Pneumococcal polysaccharide (PPSV23) vaccine. When PCV13 is also indicated, PCV13 should be obtained first. All adults aged 65 years and older should be immunized. An adult younger than age 65 years who has certain medical conditions should be immunized. Any person who resides in a nursing home or long-term care facility should be immunized. An adult smoker should be immunized. People with an immunocompromised condition and certain other conditions should receive both PCV13 and PPSV23 vaccines. People with human immunodeficiency virus (HIV) infection should be immunized as soon as possible after diagnosis. Immunization during chemotherapy or radiation therapy should be avoided. Routine use of PPSV23 vaccine is not recommended for American Indians, [place] Natives, or people younger than 65 years unless there are medical conditions that require PPSV23 vaccine. When indicated, people who have unknown immunization and have no record of immunization should receive PPSV23 vaccine. One-time revaccination 5 years after the first dose of PPSV23 is recommended for people aged 19-64 years who have chronic kidney failure, nephrotic syndrome, asplenia, or immunocompromised conditions. People who received 1-2 doses of PPSV23 before age 65 years should receive another dose of PPSV23 vaccine at age 65 years or later if at least 5 years have passed since the previous dose. Doses of PPSV23 are not needed for people immunized with PPSV23 at or after age 65 years.  Meningococcal vaccine. Adults with asplenia or persistent complement component deficiencies  should receive 2 doses of quadrivalent meningococcal conjugate (MenACWY-D) vaccine. The doses should be obtained at least 2 months apart. Microbiologists working with certain meningococcal bacteria, military recruits, people at risk during an outbreak, and people who travel to or live in countries with a high rate of meningitis should be immunized. A first-year college student up through age 21 years who is living in a residence hall should receive a dose if he did not receive a dose on or after his 16th birthday. Adults who have certain high-risk conditions should receive one or more doses of vaccine.  Hepatitis A vaccine. Adults who wish to be protected from this disease, have certain high-risk conditions, work with hepatitis A-infected animals, work in hepatitis A research labs, or travel to or work in countries with a high rate of hepatitis A should be immunized. Adults who were previously unvaccinated and who anticipate close contact with an international adoptee during the first 60 days after arrival in the [place] from a country with a high rate of hepatitis A should be immunized.  Hepatitis B vaccine. Adults should be immunized if they wish to be protected from this disease, have certain high-risk conditions, may be exposed to blood or other infectious body fluids, are household contacts or sex partners of hepatitis B positive people, are clients or workers in certain care facilities, or travel to or work in countries with a high rate of hepatitis B.  Haemophilus influenzae type b (Hib) vaccine. A previously unvaccinated person with asplenia or sickle cell disease or having a scheduled splenectomy should receive 1 dose of Hib vaccine. Regardless of previous immunization, a recipient of a hematopoietic stem cell transplant should receive a 3-dose series 6-12 months after his successful transplant. Hib vaccine is not recommended for adults with HIV infection. Preventive Service /  Frequency   Ages 65 and over  Blood pressure check.** / Every 1 to 2 years.  Lipid and cholesterol check.**/ Every 5 years beginning at age 20.  Lung cancer screening. / Every year if you are aged   55-80 years and have a 30-pack-year history of smoking and currently smoke or have quit within the past 15 years. Yearly screening is stopped once you have quit smoking for at least 15 years or develop a health problem that would prevent you from having lung cancer treatment.  Fecal occult blood test (FOBT) of stool. / Every year beginning at age 50 and continuing until age 75. You may not have to do this test if you get a colonoscopy every 10 years.  Flexible sigmoidoscopy** or colonoscopy.** / Every 5 years for a flexible sigmoidoscopy or every 10 years for a colonoscopy beginning at age 50 and continuing until age 75.  Hepatitis C blood test.** / For all people born from 1945 through 1965 and any individual with known risks for hepatitis C.  Abdominal aortic aneurysm (AAA) screening for persons with history of hypertension or who are current or former smokers.  Skin self-exam. / Monthly.  Influenza vaccine. / Every year.  Tetanus, diphtheria, and acellular pertussis (Tdap/Td) vaccine.** / 1 dose of Td every 10 years.  Varicella vaccine.** / Consult your health care provider.  Zoster vaccine.** / 1 dose for adults aged 60 years or older.  Pneumococcal 13-valent conjugate (PCV13) vaccine.** / Consult your health care provider.  Pneumococcal polysaccharide (PPSV23) vaccine.** / 1 dose for all adults aged 65 years and older.  Meningococcal vaccine.** / Consult your health care provider.  Hepatitis A vaccine.** / Consult your health care provider.  Hepatitis B vaccine.** / Consult your health care provider.  Haemophilus influenzae type b (Hib) vaccine.** / Consult your health care provider.   

## 2014-03-04 LAB — HEPATIC FUNCTION PANEL
ALBUMIN: 3.9 g/dL (ref 3.5–5.2)
ALT: 22 U/L (ref 0–53)
AST: 25 U/L (ref 0–37)
Alkaline Phosphatase: 48 U/L (ref 39–117)
Bilirubin, Direct: 0.1 mg/dL (ref 0.0–0.3)
Indirect Bilirubin: 0.5 mg/dL (ref 0.2–1.2)
TOTAL PROTEIN: 7 g/dL (ref 6.0–8.3)
Total Bilirubin: 0.6 mg/dL (ref 0.2–1.2)

## 2014-03-04 LAB — ALLERGY FULL AND FOOD SPECIFIC PROFILE
Allergen, D pternoyssinus,d7: 0.16 kU/L — ABNORMAL HIGH
Apple: <0.1 kU/L
Aspergillus fumigatus, m3: <0.1 kU/L
Bahia Grass: <0.1 kU/L
Bermuda Grass: <0.1 kU/L
Candida Albicans: <0.1 kU/L
Chicken IgE: <0.1 kU/L
Corn: <0.1 kU/L
Curvularia lunata: <0.1 kU/L
D. farinae: 0.23 kU/L — ABNORMAL HIGH
Elm IgE: <0.1 kU/L
Fescue: <0.1 kU/L
G009 Red Top: <0.1 kU/L
IGE (IMMUNOGLOBULIN E), SERUM: 98 kU/L (ref <115)
Milk IgE: <0.1 kU/L
Oak: <0.1 kU/L
Orange: <0.1 kU/L
Soybean IgE: <0.1 kU/L
Timothy Grass: <0.1 kU/L
Tuna IgE: <0.1 kU/L
WHEAT IGE: 0.13 kU/L — AB

## 2014-03-04 LAB — BASIC METABOLIC PANEL WITH GFR
BUN: 14 mg/dL (ref 6–23)
CHLORIDE: 102 meq/L (ref 96–112)
CO2: 27 mEq/L (ref 19–32)
Calcium: 9.3 mg/dL (ref 8.4–10.5)
Creat: 0.91 mg/dL (ref 0.50–1.35)
GFR, Est African American: >89 mL/min
GFR, Est Non African American: 85 mL/min
GLUCOSE: 89 mg/dL (ref 70–99)
Potassium: 4 mEq/L (ref 3.5–5.3)
Sodium: 139 mEq/L (ref 135–145)

## 2014-03-04 LAB — URINALYSIS, MICROSCOPIC ONLY
Bacteria, UA: NONE SEEN
CASTS: NONE SEEN
Crystals: NONE SEEN
Squamous Epithelial / LPF: NONE SEEN

## 2014-03-04 LAB — PSA: PSA: 2.32 ng/mL (ref <=4.00)

## 2014-03-04 LAB — MICROALBUMIN / CREATININE URINE RATIO
Creatinine, Urine: 107.8 mg/dL
Microalb Creat Ratio: 167.6 mg/g — ABNORMAL HIGH (ref 0.0–30.0)
Microalb, Ur: 18.07 mg/dL — ABNORMAL HIGH (ref 0.00–1.89)

## 2014-03-04 LAB — MAGNESIUM: Magnesium: 1.7 mg/dL (ref 1.5–2.5)

## 2014-03-04 LAB — INSULIN, FASTING: INSULIN FASTING, SERUM: 36.6 u[IU]/mL — AB (ref 2.0–19.6)

## 2014-03-04 LAB — LIPID PANEL
Cholesterol: 190 mg/dL (ref 0–200)
HDL: 40 mg/dL (ref >39)
LDL Cholesterol: 116 mg/dL — ABNORMAL HIGH (ref 0–99)
Total CHOL/HDL Ratio: 4.8 Ratio
Triglycerides: 172 mg/dL — ABNORMAL HIGH (ref <150)
VLDL: 34 mg/dL (ref 0–40)

## 2014-03-04 LAB — TSH: TSH: 1.39 u[IU]/mL (ref 0.350–4.500)

## 2014-03-04 LAB — VITAMIN D 25 HYDROXY (VIT D DEFICIENCY, FRACTURES): Vit D, 25-Hydroxy: 97 ng/mL — ABNORMAL HIGH (ref 30–89)

## 2014-03-17 ENCOUNTER — Other Ambulatory Visit (INDEPENDENT_AMBULATORY_CARE_PROVIDER_SITE_OTHER): Payer: Medicare Other | Admitting: *Deleted

## 2014-03-17 DIAGNOSIS — Z1212 Encounter for screening for malignant neoplasm of rectum: Secondary | ICD-10-CM | POA: Diagnosis not present

## 2014-03-17 LAB — POC HEMOCCULT BLD/STL (HOME/3-CARD/SCREEN)
FECAL OCCULT BLD: NEGATIVE
FECAL OCCULT BLD: NEGATIVE
Fecal Occult Blood, POC: NEGATIVE

## 2014-03-24 ENCOUNTER — Telehealth: Payer: Self-pay | Admitting: *Deleted

## 2014-03-24 MED ORDER — AZITHROMYCIN 250 MG PO TABS: ORAL_TABLET | ORAL | Status: DC

## 2014-03-24 NOTE — Telephone Encounter (Signed)
Patient called and left detailed message with the front staff.  I received a written message stating patient's symptoms and c/o of sore throat, sinus pressure and pain times 1 week.  Patient called at 10:46 am but is requesting Rx asap bc going out of town today at noon.  Per Dr. Idell Pickles orders I called patient and advised a Zpak was sent into pharm for patient and advised f/u ov to further eval and Tx if no relief with symptoms.

## 2014-04-03 ENCOUNTER — Encounter: Payer: Self-pay | Admitting: Internal Medicine

## 2014-04-03 ENCOUNTER — Ambulatory Visit (INDEPENDENT_AMBULATORY_CARE_PROVIDER_SITE_OTHER): Payer: Medicare Other | Admitting: Internal Medicine

## 2014-04-03 VITALS — BP 164/102 | HR 74 | Temp 98.0°F | Resp 16 | Ht 67.0 in | Wt 196.0 lb

## 2014-04-03 DIAGNOSIS — Z79899 Other long term (current) drug therapy: Secondary | ICD-10-CM | POA: Diagnosis not present

## 2014-04-03 DIAGNOSIS — W57XXXA Bitten or stung by nonvenomous insect and other nonvenomous arthropods, initial encounter: Secondary | ICD-10-CM | POA: Diagnosis not present

## 2014-04-03 DIAGNOSIS — R21 Rash and other nonspecific skin eruption: Secondary | ICD-10-CM

## 2014-04-03 DIAGNOSIS — T148 Other injury of unspecified body region: Secondary | ICD-10-CM | POA: Diagnosis not present

## 2014-04-03 LAB — CBC WITH DIFFERENTIAL/PLATELET
BASOS PCT: 0 % (ref 0–1)
Basophils Absolute: 0 10*3/uL (ref 0.0–0.1)
Eosinophils Absolute: 0 10*3/uL (ref 0.0–0.7)
Eosinophils Relative: 0 % (ref 0–5)
HEMATOCRIT: 47.3 % (ref 39.0–52.0)
HEMOGLOBIN: 16.8 g/dL (ref 13.0–17.0)
LYMPHS ABS: 1.9 10*3/uL (ref 0.7–4.0)
Lymphocytes Relative: 24 % (ref 12–46)
MCH: 30.9 pg (ref 26.0–34.0)
MCHC: 35.5 g/dL (ref 30.0–36.0)
MCV: 87.1 fL (ref 78.0–100.0)
MONO ABS: 0.3 10*3/uL (ref 0.1–1.0)
MONOS PCT: 4 % (ref 3–12)
NEUTROS PCT: 72 % (ref 43–77)
Neutro Abs: 5.7 10*3/uL (ref 1.7–7.7)
Platelets: 174 10*3/uL (ref 150–400)
RBC: 5.43 MIL/uL (ref 4.22–5.81)
RDW: 14.3 % (ref 11.5–15.5)
WBC: 7.9 10*3/uL (ref 4.0–10.5)

## 2014-04-03 NOTE — Patient Instructions (Signed)
Tick Bite Information Ticks are insects that attach themselves to the skin and draw blood for food. There are various types of ticks. Common types include wood ticks and deer ticks. Most ticks live in shrubs and grassy areas. Ticks can climb onto your body when you make contact with leaves or grass where the tick is waiting. The most common places on the body for ticks to attach themselves are the scalp, neck, armpits, waist, and groin. Most tick bites are harmless, but sometimes ticks carry germs that cause diseases. These germs can be spread to a person during the tick's feeding process. The chance of a disease spreading through a tick bite depends on:   The type of tick.  Time of year.   How long the tick is attached.   Geographic location.  HOW CAN YOU PREVENT TICK BITES? Take these steps to help prevent tick bites when you are outdoors:  Wear protective clothing. Long sleeves and long pants are best.   Wear white clothes so you can see ticks more easily.  Tuck your pant legs into your socks.   If walking on a trail, stay in the middle of the trail to avoid brushing against bushes.  Avoid walking through areas with long grass.  Put insect repellent on all exposed skin and along boot tops, pant legs, and sleeve cuffs.   Check clothing, hair, and skin repeatedly and before going inside.   Brush off any ticks that are not attached.  Take a shower or bath as soon as possible after being outdoors.  WHAT IS THE PROPER WAY TO REMOVE A TICK? Ticks should be removed as soon as possible to help prevent diseases caused by tick bites. 1. If latex gloves are available, put them on before trying to remove a tick.  2. Using fine-point tweezers, grasp the tick as close to the skin as possible. You may also use curved forceps or a tick removal tool. Grasp the tick as close to its head as possible. Avoid grasping the tick on its body. 3. Pull gently with steady upward pressure until  the tick lets go. Do not twist the tick or jerk it suddenly. This may break off the tick's head or mouth parts. 4. Do not squeeze or crush the tick's body. This could force disease-carrying fluids from the tick into your body.  5. After the tick is removed, wash the bite area and your hands with soap and water or other disinfectant such as alcohol. 6. Apply a small amount of antiseptic cream or ointment to the bite site.  7. Wash and disinfect any instruments that were used.  Do not try to remove a tick by applying a hot match, petroleum jelly, or fingernail polish to the tick. These methods do not work and may increase the chances of disease being spread from the tick bite.  WHEN SHOULD YOU SEEK MEDICAL CARE? Contact your health care provider if you are unable to remove a tick from your skin or if a part of the tick breaks off and is stuck in the skin.  After a tick bite, you need to be aware of signs and symptoms that could be related to diseases spread by ticks. Contact your health care provider if you develop any of the following in the days or weeks after the tick bite:  Unexplained fever.  Rash. A circular rash that appears days or weeks after the tick bite may indicate the possibility of Lyme disease. The rash may resemble   a target with a bull's-eye and may occur at a different part of your body than the tick bite.  Redness and swelling in the area of the tick bite.   Tender, swollen lymph glands.   Diarrhea.   Weight loss.   Cough.   Fatigue.   Muscle, joint, or bone pain.   Abdominal pain.   Headache.   Lethargy or a change in your level of consciousness.  Difficulty walking or moving your legs.   Numbness in the legs.   Paralysis.  Shortness of breath.   Confusion.   Repeated vomiting.  Document Released: 05/27/2000 Document Revised: 03/20/2013 Document Reviewed: 11/07/2012 [employer] Patient Information 2015 [employer]. This information is  not intended to replace advice given to you by your health care provider. Make sure you discuss any questions you have with your health care provider.  

## 2014-04-03 NOTE — Progress Notes (Signed)
Subjective:    Patient ID: [name], male    DOB: 04-03-1946, 68 y.o.   MRN: [id number]  HPI Patient presents with a hx/o a tick bite ~ 1-2 months ago and now a 2-3 week hx/o a fleeting rash of his trunk and extremities.    Medication List   aspirin EC 81 MG tablet  Take 81 mg by mouth daily.     B COMPLEX-VITAMIN B12 PO  Take 1 tablet by mouth daily.     bisoprolol-hydrochlorothiazide 5-6.25 MG per tablet  Commonly known as:  ZIAC  Take 1 tablet by mouth daily. For BP and Heart     clobetasol 0.05 % external solution  Commonly known as:  TEMOVATE  Apply 1 application topically daily.     clopidogrel 75 MG tablet  Commonly known as:  PLAVIX  TAKE 1 TABLET BY MOUTH ONCE DAILY     Co Q 10 100 MG Caps  Take 100 mg by mouth daily.     fish oil-omega-3 fatty acids 1000 MG capsule  Take 2 g by mouth daily.     Flax Seed Oil 1000 MG Caps  Take 1,000 mg by mouth 2 (two) times daily.     fluocinonide 0.05 % external solution  Commonly known as:  LIDEX     folic acid 852 MCG tablet  Commonly known as:  FOLVITE  Take 800 mcg by mouth daily.     Magnesium 500 MG Tabs  Take 500 mg by mouth 2 (two) times daily.     MELATONIN PO  Take 30 mg by mouth daily.     multivitamin capsule  Take 1 capsule by mouth daily.     naproxen sodium 220 MG tablet  Commonly known as:  ANAPROX  Take 220 mg by mouth daily as needed.     niacin 500 MG tablet  Take 500 mg by mouth 2 (two) times daily.     nitroGLYCERIN 0.4 MG SL tablet  Commonly known as:  NITROSTAT  Place 0.4 mg under the tongue every 5 (five) minutes as needed. For chest pain.     OVER THE COUNTER MEDICATION  Takes ALA(alpha lipoic)     Turmeric 500 MG Caps  Take 500 mg by mouth daily.     Valerian 400 MG Caps  Take 1,200 mg by mouth at bedtime.     vitamin C 1000 MG tablet  Take 1,000 mg by mouth daily.     Allergies  Allergen Reactions  . Adhesive [Tape]     SKIN PEELS  . Codeine Itching   Also hallucinations   . Crestor [Rosuvastatin]   . Cymbalta [Duloxetine Hcl]   . Dilaudid [Hydromorphone Hcl]   . Effexor [Venlafaxine]   . Morphine And Related Other (See Comments)    Hallucinations  . Nsaids Other (See Comments)    BLEEDING  . Percocet [Oxycodone-Acetaminophen]   . Sulfa Antibiotics   . Topamax [Topiramate]   . Trazodone And Nefazodone   . Penicillins Rash   Past Medical History  Diagnosis Date  . CAD (coronary artery disease)     pt last left heart cath was in Nov 2008. EF was 55% on left ventriculogram. Circumflex was totally occluded after the 1st obtuse marginal with collaterals supplying the distal circumflex. LAD showed luminal irregularities. The stent in LAD was patent. The RCA showed luminal irregularities. The stent in th emid RCA and PDA were patent. There were no inverventions.   . Obesity   .  Low back pain     chronic  . GERD (gastroesophageal reflux disease)     hiatal hernia. Patient did hav ea Nissen fundoplication  . Peptic ulcer disease   . DM2 (diabetes mellitus, type 2)     well controlled  . Spinal fracture     hx of traumatic in Jan 2009 after falling off the roof  . Chronic obstructive pulmonary disease     asthma  . Benign prostatic hypertrophy   . Osteoarthritis   . Anxiety     treated /w Xanax for mild depression , using 2 times per day, not PRN  . Depression   . Asthma   . Sleep concern     states the study (2003 )was done at [facility]., it failed & he was told to return & he never did. Pt. states he has been found by prev. hosp. staff that he has to be told when to breathe & his wife does the same.   . Cluster headache     relative to sinus problems   . Cluster headache   . History of blood transfusion     need for Bld. transfusion relative to taking NSAIDS  . Cancer     basal cell- face & head  . 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 HTN (hypertension)      pt. followed by [facility], last cardiac visit 2012, one yr. ago   Past Surgical History  Procedure Laterality Date  . Hiatal hernia repair    . Lumbar spine surgery      x4  . Right temporal artery biopsy    . C5-t6 posterior fusion      with Oasis and radius screws  . Right carpal tunnel release    . Esophageal manometry    . Laparoscopic cholecystectomy      & IOC  . Umbilical hernia repair    . Multiple percutaneous coronary interventions    . Back surgery      x3- last surgery lumbar- 1998- / fusion   . Cataracts      cataracts removed- /w IOL - both eyes   . Blepheroplasty      both eyes  . Eye surgery    . Nasal sinus surgery      x2, sees Dr. [name], still having problems, states he uses Benadryl PRN- up to 5 times per day   . Cardiac catheterization      2008 & 2012 multiple stents    . Hardware removal  02/20/2012    Procedure: HARDWARE REMOVAL;  Surgeon: [name], MD;  Location: [facility];  Service: Neurosurgery;  Laterality: N/A;  Hardware Removal   Review of Systems  In addition to the HPI above,  No Fever-chills,  No Headache, No changes with Vision or hearing,  No problems swallowing food or Liquids,  No Chest pain or productive Cough or Shortness of Breath,  No Abdominal pain, No Nausea or Vomitting, Bowel movements are regular,  No Blood in stool or Urine,  No dysuria,  No bruises,  No new joints pains-aches,  No new weakness, tingling, numbness in any extremity,  No recent weight loss,  No polyuria, polydypsia or polyphagia,  No significant Mental Stressors.  A full 10 point Review of Systems was done, except as stated above, all other Review of Systems were negative  Objective:   Physical Exam  BP 164/102  Pulse 74  Temp98 F   Resp 16  Ht 5\' 7"    Wt 196 lb   BMI 30.69  Exam focused on skin show few pale pink or salmon colored flat non raised 2-3 mm lesions over the chest and upper extremities. No raised, ulcerative or or blanching  lesions are seen.     Assessment & Plan:   1. Tick bite  - Lyme Juliette Alcide. Blt. IgG & IgM w/bands - Rocky mtn spotted fvr abs pnl(IgG+IgM)  2. Rash and nonspecific skin eruption   3. Medication management  - CBC with Differential

## 2014-04-04 LAB — LYME ABY, WSTRN BLT IGG & IGM W/BANDS
B BURGDORFERI IGG ABS (IB): NEGATIVE
B burgdorferi IgM Abs (IB): NEGATIVE
LYME DISEASE 18 KD IGG: NONREACTIVE
LYME DISEASE 45 KD IGG: NONREACTIVE
LYME DISEASE 58 KD IGG: NONREACTIVE
LYME DISEASE 93 KD IGG: NONREACTIVE
Lyme Disease 23 kD IgG: NONREACTIVE
Lyme Disease 23 kD IgM: NONREACTIVE
Lyme Disease 28 kD IgG: NONREACTIVE
Lyme Disease 30 kD IgG: NONREACTIVE
Lyme Disease 39 kD IgG: NONREACTIVE
Lyme Disease 39 kD IgM: NONREACTIVE
Lyme Disease 41 kD IgG: NONREACTIVE
Lyme Disease 41 kD IgM: NONREACTIVE
Lyme Disease 66 kD IgG: NONREACTIVE

## 2014-04-04 LAB — ROCKY MTN SPOTTED FVR ABS PNL(IGG+IGM)
RMSF IGG: 4.11 IV — AB
RMSF IgM: 0.14 IV

## 2014-04-29 ENCOUNTER — Encounter: Payer: Self-pay | Admitting: Physician Assistant

## 2014-04-29 ENCOUNTER — Ambulatory Visit: Payer: Self-pay | Admitting: Physician Assistant

## 2014-04-29 ENCOUNTER — Ambulatory Visit (INDEPENDENT_AMBULATORY_CARE_PROVIDER_SITE_OTHER): Payer: Medicare Other | Admitting: Physician Assistant

## 2014-04-29 VITALS — BP 124/80 | HR 62 | Temp 99.8°F | Resp 16 | Ht 67.0 in | Wt 196.0 lb

## 2014-04-29 DIAGNOSIS — J0100 Acute maxillary sinusitis, unspecified: Secondary | ICD-10-CM

## 2014-04-29 DIAGNOSIS — I2510 Atherosclerotic heart disease of native coronary artery without angina pectoris: Secondary | ICD-10-CM

## 2014-04-29 MED ORDER — AZITHROMYCIN 250 MG PO TABS: ORAL_TABLET | ORAL | Status: DC

## 2014-04-29 MED ORDER — PREDNISONE 10 MG PO TABS: ORAL_TABLET | ORAL | Status: AC

## 2014-04-29 NOTE — Patient Instructions (Signed)
-While drinking fluids, pinch and hold nose close and swallow.  This will help open up your eustachian tubes to drain the fluid behind your ear drums. -Try steam showers to open your nasal passages.  Drink lots of water to stay hydrated and to thin mucous. -Take over the counter cough medication.   -Saline Nasal Spray-  Take 2 sprays in each nostril at bedtime.  Make sure you spray towards the outside of each nostril towards the outer corner of your eye, hold nose close and tilt head back.   -It can take up to 2 weeks to feel better.  Sinusitis is mostly caused by viruses. -Take the Z-Pak as prescribed if not better in 5-7 days.  If you are not better in 10-14 days, then please call the office.  Sinusitis Sinusitis is redness, soreness, and inflammation of the paranasal sinuses. Paranasal sinuses are air pockets within the bones of your face (beneath the eyes, the middle of the forehead, or above the eyes). In healthy paranasal sinuses, mucus is able to drain out, and air is able to circulate through them by way of your nose. However, when your paranasal sinuses are inflamed, mucus and air can become trapped. This can allow bacteria and other germs to grow and cause infection. Sinusitis can develop quickly and last only a short time (acute) or continue over a long period (chronic). Sinusitis that lasts for more than 12 weeks is considered chronic.  CAUSES  Causes of sinusitis include:  Allergies.  Structural abnormalities, such as displacement of the cartilage that separates your nostrils (deviated septum), which can decrease the air flow through your nose and sinuses and affect sinus drainage.  Functional abnormalities, such as when the small hairs (cilia) that line your sinuses and help remove mucus do not work properly or are not present. SIGNS AND SYMPTOMS  Symptoms of acute and chronic sinusitis are the same. The primary symptoms are pain and pressure around the affected sinuses. Other  symptoms include:  Upper toothache.  Earache.  Headache.  Bad breath.  Decreased sense of smell and taste.  A cough, which worsens when you are lying flat.  Fatigue.  Fever.  Thick drainage from your nose, which often is green and may contain pus (purulent).  Swelling and warmth over the affected sinuses. DIAGNOSIS  Your health care provider will perform a physical exam. During the exam, your health care provider may:  Look in your nose for signs of abnormal growths in your nostrils (nasal polyps).  Tap over the affected sinus to check for signs of infection.  View the inside of your sinuses (endoscopy) using an imaging device that has a light attached (endoscope). If your health care provider suspects that you have chronic sinusitis, one or more of the following tests may be recommended:  Allergy tests.  Nasal culture. A sample of mucus is taken from your nose, sent to a lab, and screened for bacteria.  Nasal cytology. A sample of mucus is taken from your nose and examined by your health care provider to determine if your sinusitis is related to an allergy. TREATMENT  Most cases of acute sinusitis are related to a viral infection and will resolve on their own within 10 days. Sometimes medicines are prescribed to help relieve symptoms (pain medicine, decongestants, nasal steroid sprays, or saline sprays).  However, for sinusitis related to a bacterial infection, your health care provider will prescribe antibiotic medicines. These are medicines that will help kill the bacteria causing the infection.  Rarely, sinusitis is caused by a fungal infection. In theses cases, your health care provider will prescribe antifungal medicine. For some cases of chronic sinusitis, surgery is needed. Generally, these are cases in which sinusitis recurs more than 3 times per year, despite other treatments. HOME CARE INSTRUCTIONS   Drink plenty of water. Water helps thin the mucus so your  sinuses can drain more easily.  Use a humidifier.  Inhale steam 3 to 4 times a day (for example, sit in the bathroom with the shower running).  Apply a warm, moist washcloth to your face 3 to 4 times a day, or as directed by your health care provider.  Use saline nasal sprays to help moisten and clean your sinuses.  Take medicines only as directed by your health care provider.  If you were prescribed either an antibiotic or antifungal medicine, finish it all even if you start to feel better. SEEK IMMEDIATE MEDICAL CARE IF:  You have increasing pain or severe headaches.  You have nausea, vomiting, or drowsiness.  You have swelling around your face.  You have vision problems.  You have a stiff neck.  You have difficulty breathing. MAKE SURE YOU:   Understand these instructions.  Will watch your condition.  Will get help right away if you are not doing well or get worse. Document Released: 05/30/2005 Document Revised: 10/14/2013 Document Reviewed: 06/14/2011 [facility] Patient Information 2015 [place], [place]. This information is not intended to replace advice given to you by your health care provider. Make sure you discuss any questions you have with your health care provider.

## 2014-04-29 NOTE — Progress Notes (Signed)
Subjective:    Patient ID: [name], male    DOB: 1945/11/15, 68 y.o.   MRN: [id number]  Cough This is a new problem. Episode onset: this week. The problem has been gradually worsening. Cough characteristics: sputum yellow in color. Associated symptoms include a fever, postnasal drip, rhinorrhea and a sore throat. Pertinent negatives include no chest pain, chills, ear congestion, ear pain, headaches, heartburn, hemoptysis, myalgias, nasal congestion, rash, shortness of breath, sweats, weight loss or wheezing. Treatments tried: phenylephrine?.  given up on antihistamines due to memory issues. His past medical history is significant for COPD and environmental allergies. Quit smoking on 02/14/1978.  Lives with wife and dog at home. Patient states that all the allergy medication OTC causes memory issues.  He states he has been to several allergist with no success.   GFR- 85 Review of Systems  Constitutional: Positive for fever and fatigue. Negative for chills, weight loss, diaphoresis and appetite change.  HENT: Positive for postnasal drip, rhinorrhea, sinus pressure and sore throat. Negative for ear discharge, ear pain, trouble swallowing and voice change.        Sinus pressure for 2 weeks.  Took Z-Pak.  Has been doing salt water gargles and admits to irrigating too much.  Eyes: Negative.   Respiratory: Positive for cough. Negative for hemoptysis, shortness of breath and wheezing.   Cardiovascular: Negative.  Negative for chest pain.  Gastrointestinal: Negative.  Negative for heartburn.  Genitourinary: Negative.   Musculoskeletal: Negative.  Negative for myalgias.  Skin: Negative for rash.       States he has a "yeast smell on his skin".  Took diflucan in the past with no relief.  Allergic/Immunologic: Positive for environmental allergies.  Neurological: Positive for dizziness. Negative for light-headedness and headaches.  Psychiatric/Behavioral: Negative.    Past Medical History   Diagnosis Date  . CAD (coronary artery disease)     pt last left heart cath was in Nov 2008. EF was 55% on left ventriculogram. Circumflex was totally occluded after the 1st obtuse marginal with collaterals supplying the distal circumflex. LAD showed luminal irregularities. The stent in LAD was patent. The RCA showed luminal irregularities. The stent in th emid RCA and PDA were patent. There were no inverventions.   . Obesity   . Low back pain     chronic  . GERD (gastroesophageal reflux disease)     hiatal hernia. Patient did hav ea Nissen fundoplication  . Peptic ulcer disease   . DM2 (diabetes mellitus, type 2)     well controlled  . Spinal fracture     hx of traumatic in Jan 2009 after falling off the roof  . Chronic obstructive pulmonary disease     asthma  . Benign prostatic hypertrophy   . Osteoarthritis   . Anxiety     treated /w Xanax for mild depression , using 2 times per day, not PRN  . Depression   . Asthma   . Sleep concern     states the study (2003 )was done at [facility]., it failed & he was told to return & he never did. Pt. states he has been found by prev. hosp. staff that he has to be told when to breathe & his wife does the same.   . Cluster headache     relative to sinus problems   . Cluster headache   . History of blood transfusion     need for Bld. transfusion relative to taking NSAIDS  . Cancer  basal cell- face & head  . 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 HTN (hypertension)     pt. followed by  Cardiac, last cardiac visit 2012, one yr. ago   Current Outpatient Prescriptions on File Prior to Visit  Medication Sig Dispense Refill  . Ascorbic Acid (VITAMIN C) 1000 MG tablet Take 1,000 mg by mouth daily.    Marland Kitchen aspirin EC 81 MG tablet Take 81 mg by mouth daily.    . B Complex-Folic Acid (B COMPLEX-VITAMIN B12 PO) Take 1 tablet by mouth daily.     . bisoprolol-hydrochlorothiazide  (ZIAC) 5-6.25 MG per tablet Take 1 tablet by mouth daily. For BP and Heart 90 tablet 99  . clobetasol (TEMOVATE) 0.05 % external solution Apply 1 application topically daily. 50 mL 0  . clopidogrel (PLAVIX) 75 MG tablet TAKE 1 TABLET BY MOUTH ONCE DAILY 30 tablet 99  . Coenzyme Q10 (CO Q 10) 100 MG CAPS Take 100 mg by mouth daily.     . fish oil-omega-3 fatty acids 1000 MG capsule Take 2 g by mouth daily.     . Flaxseed, Linseed, (FLAX SEED OIL) 1000 MG CAPS Take 1,000 mg by mouth 2 (two) times daily.     . fluocinonide (LIDEX) 0.05 % external solution     . folic acid (FOLVITE) 800 MCG tablet Take 800 mcg by mouth daily.    . Magnesium 500 MG TABS Take 500 mg by mouth 2 (two) times daily.    Marland Kitchen MELATONIN PO Take 30 mg by mouth daily.    . Multiple Vitamin (MULTIVITAMIN) capsule Take 1 capsule by mouth daily.      . naproxen sodium (ANAPROX) 220 MG tablet Take 220 mg by mouth daily as needed.    . niacin 500 MG tablet Take 500 mg by mouth 2 (two) times daily.    . nitroGLYCERIN (NITROSTAT) 0.4 MG SL tablet Place 0.4 mg under the tongue every 5 (five) minutes as needed. For chest pain.    Marland Kitchen OVER THE COUNTER MEDICATION Takes ALA(alpha lipoic)    . Turmeric 500 MG CAPS Take 500 mg by mouth daily.     . Valerian 400 MG CAPS Take 1,200 mg by mouth at bedtime.      No current facility-administered medications on file prior to visit.   Allergies  Allergen Reactions  . Adhesive [Tape]     SKIN PEELS  . Codeine Itching    Also hallucinations   . Crestor [Rosuvastatin]   . Cymbalta [Duloxetine Hcl]   . Dilaudid [Hydromorphone Hcl]   . Effexor [Venlafaxine]   . Morphine And Related Other (See Comments)    Hallucinations  . Nsaids Other (See Comments)    BLEEDING  . Percocet [Oxycodone-Acetaminophen]   . Sulfa Antibiotics   . Topamax [Topiramate]   . Trazodone And Nefazodone   . Penicillins Rash     BP 124/80 mmHg  Pulse 62  Temp(Src) 99.8 F (37.7 C) (Temporal)  Resp 16  Ht 5\' 7"   (1.702 m)  Wt 196 lb (88.905 kg)  BMI 30.69 kg/m2  SpO2 96% Wt Readings from Last 3 Encounters:  04/29/14 196 lb (88.905 kg)  04/03/14 196 lb (88.905 kg)  03/03/14 194 lb (87.998 kg)   Objective:   Physical Exam  Constitutional: He is oriented to person, place, and time. He appears well-developed and well-nourished. He has a sickly appearance. No distress.  HENT:  Head: Normocephalic.  Right Ear: Tympanic membrane, external ear and ear canal normal. No swelling or tenderness. Tympanic membrane is not injected, not scarred, not perforated, not erythematous, not retracted and not bulging.  Left Ear: Tympanic membrane, external ear and ear canal normal. No swelling or tenderness. Tympanic membrane is not injected, not scarred, not perforated, not erythematous, not retracted and not bulging.  Nose: No mucosal edema. Right sinus exhibits maxillary sinus tenderness. Right sinus exhibits no frontal sinus tenderness. Left sinus exhibits maxillary sinus tenderness. Left sinus exhibits no frontal sinus tenderness.  Mouth/Throat: Uvula is midline and mucous membranes are normal. Mucous membranes are not pale and not dry. No uvula swelling. Posterior oropharyngeal erythema present. No oropharyngeal exudate, posterior oropharyngeal edema or tonsillar abscesses.  Mild left ear canal bleed. Turbinates are erythematous and non-edematous bilaterally.   Eyes: Conjunctivae and lids are normal. Pupils are equal, round, and reactive to light. Right eye exhibits no discharge. Left eye exhibits no discharge. No scleral icterus.  Neck: Trachea normal, normal range of motion and phonation normal. No tracheal tenderness present. No tracheal deviation present.  Cardiovascular: Normal rate, regular rhythm, S1 normal, S2 normal, normal heart sounds and normal pulses.  Exam reveals no gallop, no distant heart sounds and no friction rub.   No murmur heard. Pulmonary/Chest: Effort normal and breath sounds normal. No  stridor. No respiratory distress. He has no decreased breath sounds. He has no wheezes. He has no rhonchi. He has no rales. He exhibits no tenderness.  Abdominal: Soft. Bowel sounds are normal. There is no tenderness. There is no rebound and no guarding.  Musculoskeletal: Normal range of motion.  Lymphadenopathy:  No tenderness or LAD.  Neurological: He is alert and oriented to person, place, and time. He has normal strength.  Skin: Skin is warm, dry and intact. No rash noted. He is not diaphoretic. No cyanosis or erythema. Nails show no clubbing.  Psychiatric: He has a normal mood and affect. His speech is normal and behavior is normal. Judgment and thought content normal. Cognition and memory are normal.  Vitals reviewed.     Assessment & Plan:  1. Acute maxillary sinusitis, recurrence not specified -Take prednisone as prescribed for inflammation- predniSONE (DELTASONE) 10 MG tablet; Take 3 tablets PO for 2 days, then take 2 tablets PO for 2 days, then take 1 tablet PO for 3 days.  Dispense: 13 tablet; Refill: 0 - Take second round of Z-Pak as prescribed if not better in 7-10 days- azithromycin (ZITHROMAX Z-PAK) 250 MG tablet; Take 2 tablets PO on day 1, then take 1 tablet PO QDaily for 4 days.  Dispense: 6 tablet; Refill: 0 -While drinking fluids, pinch and hold nose close and swallow.  This will help open up your eustachian tubes to drain the fluid behind your ear drums. -Try steam showers to open your nasal passages.  Drink lots of water to stay hydrated and to thin mucous. -Patient declined cough medication right now and will take cough medication OTC. -Saline Nasal Spray-  Take 2 sprays in each nostril at bedtime.  Make sure you spray towards the outside of each nostril towards the outer corner of your eye, hold nose close and tilt head back.   -STOP using Q-Tips in the ears.  Instead use mineral oil and bulb with warm water for cerumen removal. -Patient will decide if he would like to be  referred to allergist to get allergy shots due to patient stating allergy medication over the counter does not  work and causes memory issues.  Discussed medication effects and SE's.  Pt agreed to treatment plan. If you are not better in 10-14 days, then please call the office. Please keep your follow up appt on 06/03/14.  Airiana Elman, Lise Auer, PA-C 1:55 PM [facility]

## 2014-05-05 ENCOUNTER — Encounter: Payer: Self-pay | Admitting: Physician Assistant

## 2014-05-05 ENCOUNTER — Ambulatory Visit (INDEPENDENT_AMBULATORY_CARE_PROVIDER_SITE_OTHER): Payer: Medicare Other | Admitting: Physician Assistant

## 2014-05-05 VITALS — BP 132/80 | HR 64 | Temp 98.1°F | Resp 16 | Ht 67.0 in | Wt 198.0 lb

## 2014-05-05 DIAGNOSIS — I2510 Atherosclerotic heart disease of native coronary artery without angina pectoris: Secondary | ICD-10-CM

## 2014-05-05 DIAGNOSIS — J0101 Acute recurrent maxillary sinusitis: Secondary | ICD-10-CM | POA: Diagnosis not present

## 2014-05-05 NOTE — Patient Instructions (Addendum)
-Stop using Q-Tips due to bleeding in left ear.  While drinking fluids, pinch and hold nose close and swallow, to help open eustachian tubes.    -Will refer to allergist for allergy shots.   Sinusitis Sinusitis is redness, soreness, and inflammation of the paranasal sinuses. Paranasal sinuses are air pockets within the bones of your face (beneath the eyes, the middle of the forehead, or above the eyes). In healthy paranasal sinuses, mucus is able to drain out, and air is able to circulate through them by way of your nose. However, when your paranasal sinuses are inflamed, mucus and air can become trapped. This can allow bacteria and other germs to grow and cause infection. Sinusitis can develop quickly and last only a short time (acute) or continue over a long period (chronic). Sinusitis that lasts for more than 12 weeks is considered chronic.  CAUSES  Causes of sinusitis include:  Allergies.  Structural abnormalities, such as displacement of the cartilage that separates your nostrils (deviated septum), which can decrease the air flow through your nose and sinuses and affect sinus drainage.  Functional abnormalities, such as when the small hairs (cilia) that line your sinuses and help remove mucus do not work properly or are not present. SIGNS AND SYMPTOMS  Symptoms of acute and chronic sinusitis are the same. The primary symptoms are pain and pressure around the affected sinuses. Other symptoms include:  Upper toothache.  Earache.  Headache.  Bad breath.  Decreased sense of smell and taste.  A cough, which worsens when you are lying flat.  Fatigue.  Fever.  Thick drainage from your nose, which often is green and may contain pus (purulent).  Swelling and warmth over the affected sinuses. DIAGNOSIS  Your health care provider will perform a physical exam. During the exam, your health care provider may:  Look in your nose for signs of abnormal growths in your nostrils (nasal  polyps).  Tap over the affected sinus to check for signs of infection.  View the inside of your sinuses (endoscopy) using an imaging device that has a light attached (endoscope). If your health care provider suspects that you have chronic sinusitis, one or more of the following tests may be recommended:  Allergy tests.  Nasal culture. A sample of mucus is taken from your nose, sent to a lab, and screened for bacteria.  Nasal cytology. A sample of mucus is taken from your nose and examined by your health care provider to determine if your sinusitis is related to an allergy. TREATMENT  Most cases of acute sinusitis are related to a viral infection and will resolve on their own within 10 days. Sometimes medicines are prescribed to help relieve symptoms (pain medicine, decongestants, nasal steroid sprays, or saline sprays).  However, for sinusitis related to a bacterial infection, your health care provider will prescribe antibiotic medicines. These are medicines that will help kill the bacteria causing the infection.  Rarely, sinusitis is caused by a fungal infection. In theses cases, your health care provider will prescribe antifungal medicine. For some cases of chronic sinusitis, surgery is needed. Generally, these are cases in which sinusitis recurs more than 3 times per year, despite other treatments. HOME CARE INSTRUCTIONS   Drink plenty of water. Water helps thin the mucus so your sinuses can drain more easily.  Use a humidifier.  Inhale steam 3 to 4 times a day (for example, sit in the bathroom with the shower running).  Apply a warm, moist washcloth to your face 3  to 4 times a day, or as directed by your health care provider.  Use saline nasal sprays to help moisten and clean your sinuses.  Take medicines only as directed by your health care provider.  If you were prescribed either an antibiotic or antifungal medicine, finish it all even if you start to feel better. SEEK IMMEDIATE  MEDICAL CARE IF:  You have increasing pain or severe headaches.  You have nausea, vomiting, or drowsiness.  You have swelling around your face.  You have vision problems.  You have a stiff neck.  You have difficulty breathing. MAKE SURE YOU:   Understand these instructions.  Will watch your condition.  Will get help right away if you are not doing well or get worse. Document Released: 05/30/2005 Document Revised: 10/14/2013 Document Reviewed: 06/14/2011 [facility] Patient Information 2015 [place], [place]. This information is not intended to replace advice given to you by your health care provider. Make sure you discuss any questions you have with your health care provider.

## 2014-05-05 NOTE — Progress Notes (Signed)
Subjective:    Patient ID: [name], male    DOB: October 07, 1945, 68 y.o.   MRN: [id number]  Sinusitis This is a recurrent problem. Episode onset: Last seen on 04/29/14.  Was given 2 z-Paks and prednisone.  Pt states OTC allergy medication does not work for him. The problem has been gradually improving since onset. There has been no fever. Associated symptoms include congestion, coughing, sinus pressure and a sore throat. Pertinent negatives include no chills, diaphoresis, ear pain, headaches, hoarse voice, neck pain, shortness of breath, sneezing or swollen glands. (Ear congestion in left ear that started this morning.  Severe coughing attack that started last Friday.) Treatments tried: Also tried saline nasal spray 3 times a day and OTC cough medicaiton that he states is working. The treatment provided mild relief.  Review of Systems  Constitutional: Negative.  Negative for fever, chills, diaphoresis and fatigue.  HENT: Positive for congestion, rhinorrhea, sinus pressure, sore throat and trouble swallowing. Negative for ear discharge, ear pain, hoarse voice, postnasal drip, sneezing and voice change.        Ear congestion  Eyes: Negative.   Respiratory: Positive for cough and wheezing. Negative for shortness of breath.        Wheezing when wake up  Cardiovascular: Negative.   Gastrointestinal: Negative.   Musculoskeletal: Negative.  Negative for neck pain.  Skin: Negative.  Negative for rash.  Allergic/Immunologic: Positive for environmental allergies.  Neurological: Negative.  Negative for headaches.  Psychiatric/Behavioral: Negative.    Past Medical History  Diagnosis Date  . CAD (coronary artery disease)     pt last left heart cath was in Nov 2008. EF was 55% on left ventriculogram. Circumflex was totally occluded after the 1st obtuse marginal with collaterals supplying the distal circumflex. LAD showed luminal irregularities. The stent in LAD was patent. The RCA showed luminal  irregularities. The stent in th emid RCA and PDA were patent. There were no inverventions.   . Obesity   . Low back pain     chronic  . GERD (gastroesophageal reflux disease)     hiatal hernia. Patient did hav ea Nissen fundoplication  . Peptic ulcer disease   . DM2 (diabetes mellitus, type 2)     well controlled  . Spinal fracture     hx of traumatic in Jan 2009 after falling off the roof  . Chronic obstructive pulmonary disease     asthma  . Benign prostatic hypertrophy   . Osteoarthritis   . Anxiety     treated /w Xanax for mild depression , using 2 times per day, not PRN  . Depression   . Asthma   . Sleep concern     states the study (2003 )was done at [facility]., it failed & he was told to return & he never did. Pt. states he has been found by prev. hosp. staff that he has to be told when to breathe & his wife does the same.   . Cluster headache     relative to sinus problems   . Cluster headache   . History of blood transfusion     need for Bld. transfusion relative to taking NSAIDS  . Cancer     basal cell- face & head  . 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 HTN (hypertension)     pt. followed by [facility], last cardiac visit 2012, one yr. ago  Current Outpatient Prescriptions on File Prior to Visit  Medication Sig Dispense Refill  . Ascorbic Acid (VITAMIN C) 1000 MG tablet Take 1,000 mg by mouth daily.    Marland Kitchen aspirin EC 81 MG tablet Take 81 mg by mouth daily.    . B Complex-Folic Acid (B COMPLEX-VITAMIN B12 PO) Take 1 tablet by mouth daily.     . bisoprolol-hydrochlorothiazide (ZIAC) 5-6.25 MG per tablet Take 1 tablet by mouth daily. For BP and Heart 90 tablet 99  . clobetasol (TEMOVATE) 0.05 % external solution Apply 1 application topically daily. 50 mL 0  . clopidogrel (PLAVIX) 75 MG tablet TAKE 1 TABLET BY MOUTH ONCE DAILY 30 tablet 99  . Coenzyme Q10 (CO Q 10) 100 MG CAPS Take 100 mg by mouth  daily.     . fish oil-omega-3 fatty acids 1000 MG capsule Take 2 g by mouth daily.     . Flaxseed, Linseed, (FLAX SEED OIL) 1000 MG CAPS Take 1,000 mg by mouth 2 (two) times daily.     . fluocinonide (LIDEX) 0.05 % external solution     . folic acid (FOLVITE) 800 MCG tablet Take 800 mcg by mouth daily.    . Magnesium 500 MG TABS Take 500 mg by mouth 2 (two) times daily.    Marland Kitchen MELATONIN PO Take 30 mg by mouth daily.    . Multiple Vitamin (MULTIVITAMIN) capsule Take 1 capsule by mouth daily.      . naproxen sodium (ANAPROX) 220 MG tablet Take 220 mg by mouth daily as needed.    . niacin 500 MG tablet Take 500 mg by mouth 2 (two) times daily.    . nitroGLYCERIN (NITROSTAT) 0.4 MG SL tablet Place 0.4 mg under the tongue every 5 (five) minutes as needed. For chest pain.    Marland Kitchen OVER THE COUNTER MEDICATION Takes ALA(alpha lipoic)    . predniSONE (DELTASONE) 10 MG tablet Take 3 tablets PO for 2 days, then take 2 tablets PO for 2 days, then take 1 tablet PO for 3 days. 13 tablet 0  . Turmeric 500 MG CAPS Take 500 mg by mouth daily.     . Valerian 400 MG CAPS Take 1,200 mg by mouth at bedtime.      No current facility-administered medications on file prior to visit.   Allergies  Allergen Reactions  . Adhesive [Tape]     SKIN PEELS  . Codeine Itching    Also hallucinations   . Crestor [Rosuvastatin]   . Cymbalta [Duloxetine Hcl]   . Dilaudid [Hydromorphone Hcl]   . Effexor [Venlafaxine]   . Morphine And Related Other (See Comments)    Hallucinations  . Nsaids Other (See Comments)    BLEEDING  . Percocet [Oxycodone-Acetaminophen]   . Sulfa Antibiotics   . Topamax [Topiramate]   . Trazodone And Nefazodone   . Penicillins Rash     BP 132/80 mmHg  Pulse 64  Temp(Src) 98.1 F (36.7 C)  Resp 16  Ht 5\' 7"  (1.702 m)  Wt 198 lb (89.812 kg)  BMI 31.00 kg/m2 Wt Readings from Last 3 Encounters:  05/05/14 198 lb (89.812 kg)  04/29/14 196 lb (88.905 kg)  04/03/14 196 lb (88.905 kg)    Objective:   Physical Exam  Constitutional: He is oriented to person, place, and time. He appears well-developed and well-nourished. He has a sickly appearance. No distress.  HENT:  Head: Normocephalic.  Right Ear: Tympanic membrane, external ear and ear canal normal.  Left Ear: Tympanic membrane,  external ear and ear canal normal.  Nose: Rhinorrhea present. No mucosal edema. Right sinus exhibits maxillary sinus tenderness and frontal sinus tenderness. Left sinus exhibits maxillary sinus tenderness and frontal sinus tenderness.  Mouth/Throat: Uvula is midline and mucous membranes are normal. Mucous membranes are not pale and not dry. No trismus in the jaw. No uvula swelling. Posterior oropharyngeal erythema present. No oropharyngeal exudate, posterior oropharyngeal edema or tonsillar abscesses.  Left ear canal normal, except for little blood in ear canal. Turbinates erythematous and non-edematous bilaterally.  Eyes: Conjunctivae and lids are normal. Pupils are equal, round, and reactive to light. Right eye exhibits no discharge. Left eye exhibits no discharge. No scleral icterus.  Neck: Trachea normal, normal range of motion and phonation normal. Neck supple. No tracheal tenderness present. No tracheal deviation present.  Cardiovascular: Normal rate, regular rhythm, S1 normal, S2 normal, normal heart sounds and normal pulses.  Exam reveals no gallop, no distant heart sounds and no friction rub.   No murmur heard. Pulmonary/Chest: Effort normal and breath sounds normal. No stridor. No respiratory distress. He has no decreased breath sounds. He has no wheezes. He has no rhonchi. He has no rales. He exhibits no tenderness.  Abdominal: Soft. Bowel sounds are normal. There is no tenderness. There is no rebound and no guarding.  Lymphadenopathy:  No tenderness or LAD.  Mild tenderness in eustachian tube area on left side.  Neurological: He is alert and oriented to person, place, and time. Gait  normal.  Skin: Skin is warm, dry and intact. No rash noted. He is not diaphoretic.  Psychiatric: He has a normal mood and affect. His speech is normal and behavior is normal. Judgment and thought content normal. Cognition and memory are normal.  Vitals reviewed.     Assessment & Plan:  1. Acute recurrent maxillary sinusitis- Viral or allergies? - When you are drinking fluids, pinch and hold nose close and swallow, to help open up eustachian tubes to drain any fluid behind ear drums. -Continue cough syrup OTC since it is working for you. -Continue saline nasal spray twice a day. -Do NOT use Q-Tips to clean ears. - Ambulatory referral to Allergy- Will refer to allergist to see about allergy shots.  (Dr. Fannie Knee or Dr. Irena Cords)  Discussed medication effects and SE's.  Pt agreed to treatment plan. Please keep your follow up appt on 06/03/14.  [name], Lise Auer, PA-C 3:39 PM [facility]

## 2014-06-03 ENCOUNTER — Encounter: Payer: Self-pay | Admitting: Physician Assistant

## 2014-06-03 ENCOUNTER — Ambulatory Visit (INDEPENDENT_AMBULATORY_CARE_PROVIDER_SITE_OTHER): Payer: Medicare Other | Admitting: Physician Assistant

## 2014-06-03 ENCOUNTER — Ambulatory Visit: Payer: Self-pay | Admitting: Physician Assistant

## 2014-06-03 VITALS — BP 130/78 | HR 60 | Temp 97.7°F | Resp 16 | Ht 67.0 in | Wt 196.0 lb

## 2014-06-03 DIAGNOSIS — E1122 Type 2 diabetes mellitus with diabetic chronic kidney disease: Secondary | ICD-10-CM | POA: Diagnosis not present

## 2014-06-03 DIAGNOSIS — E669 Obesity, unspecified: Secondary | ICD-10-CM | POA: Diagnosis not present

## 2014-06-03 DIAGNOSIS — H61891 Other specified disorders of right external ear: Secondary | ICD-10-CM

## 2014-06-03 DIAGNOSIS — I10 Essential (primary) hypertension: Secondary | ICD-10-CM

## 2014-06-03 DIAGNOSIS — N182 Chronic kidney disease, stage 2 (mild): Secondary | ICD-10-CM

## 2014-06-03 DIAGNOSIS — J329 Chronic sinusitis, unspecified: Secondary | ICD-10-CM

## 2014-06-03 DIAGNOSIS — E782 Mixed hyperlipidemia: Secondary | ICD-10-CM | POA: Diagnosis not present

## 2014-06-03 DIAGNOSIS — Z79899 Other long term (current) drug therapy: Secondary | ICD-10-CM

## 2014-06-03 DIAGNOSIS — E559 Vitamin D deficiency, unspecified: Secondary | ICD-10-CM

## 2014-06-03 LAB — CBC WITH DIFFERENTIAL/PLATELET
Basophils Absolute: 0.1 10*3/uL (ref 0.0–0.1)
Basophils Relative: 1 % (ref 0–1)
EOS PCT: 2 % (ref 0–5)
Eosinophils Absolute: 0.1 10*3/uL (ref 0.0–0.7)
HEMATOCRIT: 44.1 % (ref 39.0–52.0)
Hemoglobin: 15.4 g/dL (ref 13.0–17.0)
Lymphocytes Relative: 29 % (ref 12–46)
Lymphs Abs: 1.8 10*3/uL (ref 0.7–4.0)
MCH: 30 pg (ref 26.0–34.0)
MCHC: 34.9 g/dL (ref 30.0–36.0)
MCV: 85.8 fL (ref 78.0–100.0)
MONO ABS: 0.4 10*3/uL (ref 0.1–1.0)
MONOS PCT: 7 % (ref 3–12)
MPV: 9.5 fL (ref 9.4–12.4)
NEUTROS ABS: 3.8 10*3/uL (ref 1.7–7.7)
Neutrophils Relative %: 61 % (ref 43–77)
Platelets: 193 10*3/uL (ref 150–400)
RBC: 5.14 MIL/uL (ref 4.22–5.81)
RDW: 13.9 % (ref 11.5–15.5)
WBC: 6.2 10*3/uL (ref 4.0–10.5)

## 2014-06-03 LAB — TSH: TSH: 1.262 u[IU]/mL (ref 0.350–4.500)

## 2014-06-03 NOTE — Patient Instructions (Signed)
We want weight loss that will last so you should lose 1-2 pounds a week.  THAT IS IT! Please pick THREE things a month to change. Once it is a habit check off the item. Then pick another three items off the list to become habits.  If you are already doing a habit on the list GREAT!  Cross that item off! o Don't drink your calories. Ie, alcohol, soda, fruit juice, and sweet tea.  o Drink more water. Drink a glass when you feel hungry or before each meal.  o Eat breakfast - Complex carb and protein (likeDannon light and fit yogurt, oatmeal, fruit, eggs, [place] bacon). o Measure your cereal.  Eat no more than one cup a day. (ie [place]) o Eat an apple a day. o Add a vegetable a day. o Try a new vegetable a month. o Use Pam! Stop using oil or butter to cook. o Don't finish your plate or use smaller plates. o Share your dessert. o Eat sugar free Jello for dessert or frozen grapes. o Don't eat 2-3 hours before bed. o Switch to whole wheat bread, pasta, and brown rice. o Make healthier choices when you eat out. No fries! o Pick baked chicken, NOT fried. o Don't forget to SLOW DOWN when you eat. It is not going anywhere.  o Take the stairs. o Park far away in the parking lot o [employer] (or weights) for 10 minutes while watching TV. o Walk at work for 10 minutes during break. o Walk outside 1 time a week with your friend, kids, dog, or significant other. o Start a walking group at [place] the mall as much as you can tolerate.  o Keep a food diary. o Weigh yourself daily. o Walk for 15 minutes 3 days per week. o Cook at home more often and eat out less.  If life happens and you go back to old habits, it is okay.  Just start over. You can do it!   If you experience chest pain, get short of breath, or tired during the exercise, please stop immediately and inform your doctor.   Gluten-Free Diet for Celiac Disease Gluten is a protein found in wheat, rye, barley, and triticale (a cross  between wheat and rye) grains. People with celiac disease need to have a gluten-free diet. With celiac disease, gluten interferes with the absorption of food and may also cause intestinal injury.  Strict compliance is important even during symptom-free periods. This means eliminating all foods with gluten from your diet permanently. This requires some significant changes but is very manageable. WHAT DO I NEED TO KNOW ABOUT A GLUTEN-FREE DIET?  Look for items labeled with "GF." Looking for GF will make it easier to identify products that are safe to eat.  Read all labels. Gluten may have been added as a minor ingredient where least expected, such as in shredded cheeses or ice creams. Always check food labels and investigate questionable ingredients. Talk to your dietitian or health care provider if you have questions about certain foods or need help finding GF foods.  Check when in doubt. If you are not sure whether an ingredient contains gluten, check with the manufacturer. Note that some manufacturers may change ingredients without notice. Always read labels.   Know how food is prepared. Since flour and cereal products are often used in the preparation of foods, it is important to be aware of the methods of preparation used, as well as the ingredients  in the foods themselves. This is especially true when you are dining out. Ask restaurants if they have a gluten-free menu.  Watch for cross-contamination. Cross-contamination occurs when gluten-free foods come into contact with foods that contain gluten. It often happens during the manufacturing process. Always check the ingredient list and for warnings on packages, such as "may contain gluten."  Eat a balanced diet. It is important to still get enough fiber, iron, and B vitamins in your diet. Look for enriched whole grain gluten-free products and continue to eat a well-balanced diet of the important non-grain items, such as vegetables, fruit, lean  proteins, legumes, and dairy.  Consider taking a gluten-free multivitamin and mineral supplement. Discuss this with your health care provider. WHAT KEY WORDS HELP IDENTIFY GLUTEN? Know key words to help identify gluten. A dietitian can help you identify possible harmful ingredients in the foods you normally eat. Words to check for on food labels include:   Flour, enriched flour, bromated flour, white flour, durum flour, graham flour, phosphated flour, self-rising flour, semolina, or farina.  Starch, dextrin, modified food starch, or cereal.  Thickening, fillers, or emulsifiers.  Any kind of malt flavoring, extract, or syrup (malt is made from barley and includes malt vinegar, malted milk, and malted beverages).  Hydrolyzed vegetable protein. WHAT FOODS CAN I EAT? Below is a list of common foods that are allowed with a gluten-free diet.  Grains Products made from the following flours or grains:amaranth,bean flours, 100% buckwheat flour, corn, millet, nut flours or meals, GF oats, quinoa, rice, sorghum, teff, any all-purpose 100% GF flour mix, rice wafers, pure cornmeal tortillas, popcorn, some crackers, some chips, and hot cereals made from cornmeal. Ask your dietitian which specific hot and cold cereals are allowed. Hominy, rice or wild rice, and special GF pasta. Some Asian rice noodles or bean noodles. Arrowroot starch, corn bran, corn flour, corn germ, cornmeal, corn starch, potato flour, potato starch flour, and rice bran. Rice flours: plain, brown, and sweet. Rice polish, soy flour, tapioca starch. Vegetables All plain, fresh, frozen, or canned vegetables.  Fruits All fresh, frozen, canned, dried fruits, and fruit juices.  Meats and Other Protein Foods Meat, fish, poultry, or eggs prepared without added wheat, rye, barley, or triticale. Some luncheon meat and some frankfurters. Pure meat. All aged cheese, most processed cheese products, some cottage cheese, and some cream cheese.  Dried beans, dried peas, and lentils.  Dairy Milk and yogurt made with allowed ingredients.  Beverages Coffee (regular or decaffeinated), tea, herbal tea (read label to be sure that no wheat flour has been added). Carbonated beverages and some root beers. Wine, sake, and distilled spirits, such as gin, vodka, and whiskey. GF beers and GF ciders.  Sweetsand Desserts Sugar, honey, some syrups, molasses, jelly, jam, plain hard candy, marshmallows, gumdrops, homemade candies free of wheat, rye, barley, or triticale. Coconut. Custard, some pudding mixes, and homemade puddings from cornstarch, rice, and tapioca. Gelatin desserts, sorbets, frozen ice pops, and sherbet. Cake, cookies, and other desserts prepared with allowed flours. Some commercial ice creams. Ask your dietitian about specific brands of dessert that are allowed.  Fats and Oils Butter, margarine, vegetable oil, sour cream not containing modified food starch, whipping cream, shortening, lard, cream, and some mayonnaise. Some commercial salad dressings. Peanut butter.  Other Homemade broth and soups made with allowed ingredients; some canned or frozen soups. Any other combination or prepared foods that do not contain gluten. Monosodium glutamate (MSG). Cider, rice, and wine vinegar. Baking soda and  baking powder. Certain soy sauces (Tamari). Ask your dietitian about specific brands that are allowed. Nuts, coconut, chocolate, and pure cocoa powder. Salt, pepper, herbs, spices, extracts, and food colorings. The items listed above may not be a complete list of allowed foods or beverages. Contact your dietitian for more options.  WHAT FOODS CAN I NOT EAT? Below is a list of common foods that are not allowed with a gluten-free diet.  Grains Barley, bran, bulgur, cracked wheat, graham, malt, matzo, wheat germ, and all wheat and rye cereals including spelt and kamut. Avoid cereals containing malt as a flavoring, such as rice cereal. Also avoid  regular noodles, spaghetti, macaroni, and most packaged rice mixes, and all others containing wheat, rye, barley, or triticale.  Vegetables Most creamed vegetables, most vegetables canned in sauces, and any vegetables prepared with wheat, rye, barley, or triticale.  Fruits Thickened or prepared fruits and some pie fillings.  Meats and Other Protein Sources Any meat or meat alternative containing wheat, rye, barley, or gluten stabilizers (such as some hot dogs, salami, cold cuts, or sausage). Bread-containing products, such as Swiss steak, croquettes, and meatloaf. Most tuna canned in vegetable broth, [place] with hydrolyzed vegetable protein (HVP) injected as part of the basting, and any cheese product containing oat gum as an ingredient. Seitan. Imitation fish. Dairy Commercial chocolate milk, which may have cereal added, and malted milk. Beverages Certain cereal beverages. Beer and ciders (unless GF), ale, malted milk, and some root beers. Sweetsand Desserts Commercial candies containing wheat, rye, barley, or triticale. Certain toffees are dusted with wheat flour. Chocolate-coated nuts, which are often rolled in flour. Cakes, cookies, doughnuts, and pastries that are prepared with wheat, barley, rye, or triticale flour. Some commercial ice creams, ice cream flavors which contain cookies, crumbs, or cheesecake. Ice cream cones. Commercially prepared mixes for cakes, cookies, and other desserts unless marked GF. Bread pudding and other puddings thickened with flour. Fats and Oils Some commercial salad dressings and sour cream containing modified food starch.  Condiments Some curry powder, some dry seasoning mixes, some gravy extracts, some meat sauces, some ketchup, some prepared mustard, horseradish. Other All soups containing wheat, rye, barley, or triticale flour. Bouillon and bouillon cubes that contain HVP. Combination or prepared foods that contain gluten. Some soy sauce, some chip  dips, and some chewing gum. Yeast extract (contains barley). Caramel color (may contain malt). The items listed above may not be a complete list of foods and beverages to avoid. Contact your dietitian for more information. Document Released: 05/30/2005 Document Revised: 10/14/2013 Document Reviewed: 04/03/2013 [facility] Patient Information 2015 [place], [place]. This information is not intended to replace advice given to you by your health care provider. Make sure you discuss any questions you have with your health care provider.

## 2014-06-03 NOTE — Progress Notes (Signed)
Assessment and Plan:  Hypertension: Continue medication, monitor blood pressure at home. Continue DASH diet.  Reminder to go to the ER if any CP, SOB, nausea, dizziness, severe HA, changes vision/speech, left arm numbness and tingling and jaw pain. Cholesterol: Continue diet and exercise. Check cholesterol.  Diabetes with diabetic chronic kidney disease-Continue diet and exercise. Check A1C Vitamin D Def- check level and continue medications. Right ear nonhealing ulcer- refer skin surgery center Chronic sinusitis stop wheat, continue allergy pill, and keep appt Dr. Annamaria Boots.    Continue diet and meds as discussed. Further disposition pending results of labs. Discussed med's effects and SE's.   HPI 68 y.o. male  presents for 3 month follow up with hypertension, hyperlipidemia, diabetes and vitamin D. His blood pressure has been controlled at home, today their BP is BP: 130/78 mmHg He does workout. He denies chest pain, shortness of breath, dizziness.  He is on cholesterol medication and denies myalgias. His cholesterol is at goal. The cholesterol was:  03/03/2014: Cholesterol, Total 190; HDL Cholesterol by NMR 40; LDL (calc) 116*; Triglycerides 172* He has been working on diet and exercise for Diabetes with diabetic chronic kidney disease, he is on bASA, he is not on ACE/ARB, and denies paresthesia of the feet, polydipsia, polyuria and visual disturbances. Last A1C was: 03/03/2014: Hemoglobin-A1c 6.1* Patient is on Vitamin D supplement. 03/03/2014: Vit D, 25-Hydroxy 97*  Has chronic sinusitis, states that he has stopped dairy and wheat and has felt that this helped. He did have an allergy panel in Sept that showed possible wheat allergy. He has a follow up with Dr. Annamaria Boots.   Current Medications:  Current Outpatient Prescriptions on File Prior to Visit  Medication Sig Dispense Refill  . Ascorbic Acid (VITAMIN C) 1000 MG tablet Take 1,000 mg by mouth daily.    Marland Kitchen aspirin EC 81 MG tablet Take 81 mg by  mouth daily.    . B Complex-Folic Acid (B COMPLEX-VITAMIN B12 PO) Take 1 tablet by mouth daily.     . bisoprolol-hydrochlorothiazide (ZIAC) 5-6.25 MG per tablet Take 1 tablet by mouth daily. For BP and Heart 90 tablet 99  . clobetasol (TEMOVATE) 0.05 % external solution Apply 1 application topically daily. 50 mL 0  . clopidogrel (PLAVIX) 75 MG tablet TAKE 1 TABLET BY MOUTH ONCE DAILY 30 tablet 99  . Coenzyme Q10 (CO Q 10) 100 MG CAPS Take 100 mg by mouth daily.     . fish oil-omega-3 fatty acids 1000 MG capsule Take 2 g by mouth daily.     . Flaxseed, Linseed, (FLAX SEED OIL) 1000 MG CAPS Take 1,000 mg by mouth 2 (two) times daily.     . fluocinonide (LIDEX) 0.05 % external solution     . folic acid (FOLVITE) 573 MCG tablet Take 800 mcg by mouth daily.    . Magnesium 500 MG TABS Take 500 mg by mouth 2 (two) times daily.    Marland Kitchen MELATONIN PO Take 30 mg by mouth daily.    . Multiple Vitamin (MULTIVITAMIN) capsule Take 1 capsule by mouth daily.      . naproxen sodium (ANAPROX) 220 MG tablet Take 220 mg by mouth daily as needed.    . niacin 500 MG tablet Take 500 mg by mouth 2 (two) times daily.    . nitroGLYCERIN (NITROSTAT) 0.4 MG SL tablet Place 0.4 mg under the tongue every 5 (five) minutes as needed. For chest pain.    Marland Kitchen OVER THE COUNTER MEDICATION Takes ALA(alpha lipoic)    .  Turmeric 500 MG CAPS Take 500 mg by mouth daily.     . Valerian 400 MG CAPS Take 1,200 mg by mouth at bedtime.      No current facility-administered medications on file prior to visit.   Medical History:  Past Medical History  Diagnosis Date  . CAD (coronary artery disease)     pt last left heart cath was in Nov 2008. EF was 55% on left ventriculogram. Circumflex was totally occluded after the 1st obtuse marginal with collaterals supplying the distal circumflex. LAD showed luminal irregularities. The stent in LAD was patent. The RCA showed luminal irregularities. The stent in th emid RCA and PDA were patent. There were  no inverventions.   . Obesity   . Low back pain     chronic  . GERD (gastroesophageal reflux disease)     hiatal hernia. Patient did hav ea Nissen fundoplication  . Peptic ulcer disease   . DM2 (diabetes mellitus, type 2)     well controlled  . Spinal fracture     hx of traumatic in Jan 2009 after falling off the roof  . Chronic obstructive pulmonary disease     asthma  . Benign prostatic hypertrophy   . Osteoarthritis   . Anxiety     treated /w Xanax for mild depression , using 2 times per day, not PRN  . Depression   . Asthma   . Sleep concern     states the study (2003 )was done at [facility]., it failed & he was told to return & he never did. Pt. states he has been found by prev. hosp. staff that he has to be told when to breathe & his wife does the same.   . Cluster headache     relative to sinus problems   . Cluster headache   . History of blood transfusion     need for Bld. transfusion relative to taking NSAIDS  . Cancer     basal cell- face & head  . 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 HTN (hypertension)     pt. followed by [facility], last cardiac visit 2012, one yr. ago   Allergies:  Allergies  Allergen Reactions  . Adhesive [Tape]     SKIN PEELS  . Codeine Itching    Also hallucinations   . Crestor [Rosuvastatin]   . Cymbalta [Duloxetine Hcl]   . Dilaudid [Hydromorphone Hcl]   . Effexor [Venlafaxine]   . Morphine And Related Other (See Comments)    Hallucinations  . Nsaids Other (See Comments)    BLEEDING  . Percocet [Oxycodone-Acetaminophen]   . Sulfa Antibiotics   . Topamax [Topiramate]   . Trazodone And Nefazodone   . Penicillins Rash     Review of Systems:  Review of Systems  Constitutional: Negative.  Negative for fever and chills.  HENT: Positive for congestion. Negative for ear discharge, ear pain, hearing loss, nosebleeds, sore throat and tinnitus.   Eyes: Negative.    Respiratory: Negative.  Negative for stridor.   Cardiovascular: Negative.   Gastrointestinal: Negative.   Genitourinary: Negative.   Musculoskeletal: Negative.   Skin: Negative.        Non healing bleeding ulcer right ear, states has had something removed from area years ago.   Neurological: Negative.  Negative for headaches.  Psychiatric/Behavioral: Negative.    Family history- Review and unchanged Social history- Review and unchanged  Physical Exam: BP 130/78 mmHg  Pulse 60  Temp(Src) 97.7 F (36.5 C)  Resp 16  Wt 196 lb (88.905 kg) Wt Readings from Last 3 Encounters:  06/03/14 196 lb (88.905 kg)  05/05/14 198 lb (89.812 kg)  04/29/14 196 lb (88.905 kg)   General Appearance: Well nourished, in no apparent distress. Eyes: PERRLA, EOMs, conjunctiva no swelling or erythema Sinuses: No Frontal/maxillary tenderness ENT/Mouth: Ext aud canals clear, TMs without erythema, bulging. No erythema, swelling, or exudate on post pharynx.  Tonsils not swollen or erythematous. Hearing normal.  Neck: Supple, thyroid normal.  Respiratory: Respiratory effort normal, BS equal bilaterally without rales, rhonchi, wheezing or stridor.  Cardio: RRR with no MRGs. Brisk peripheral pulses without edema.  Abdomen: Soft, + BS.  Non tender, no guarding, rebound, hernias, masses. Lymphatics: Non tender without lymphadenopathy.  Musculoskeletal: Full ROM, 5/5 strength, normal gait.  Skin: Bleeding erythematous, non healing nodules on right ear. Warm, dry without rashes, ecchymosis.  Neuro: Cranial nerves intact. No cerebellar symptoms. Sensation intact.  Psych: Awake and oriented X 3, normal affect, Insight and Judgment appropriate.    Vicie Mutters, PA-C 3:59 PM [facility]

## 2014-06-04 LAB — BASIC METABOLIC PANEL WITH GFR
BUN: 17 mg/dL (ref 6–23)
CHLORIDE: 99 meq/L (ref 96–112)
CO2: 28 meq/L (ref 19–32)
CREATININE: 1.04 mg/dL (ref 0.50–1.35)
Calcium: 8.8 mg/dL (ref 8.4–10.5)
GFR, Est African American: 84 mL/min
GFR, Est Non African American: 72 mL/min
GLUCOSE: 107 mg/dL — AB (ref 70–99)
Potassium: 4 mEq/L (ref 3.5–5.3)
Sodium: 135 mEq/L (ref 135–145)

## 2014-06-04 LAB — LIPID PANEL
CHOL/HDL RATIO: 5.1 ratio
CHOLESTEROL: 178 mg/dL (ref 0–200)
HDL: 35 mg/dL — ABNORMAL LOW (ref >39)
LDL Cholesterol: 109 mg/dL — ABNORMAL HIGH (ref 0–99)
Triglycerides: 169 mg/dL — ABNORMAL HIGH (ref <150)
VLDL: 34 mg/dL (ref 0–40)

## 2014-06-04 LAB — HEMOGLOBIN A1C
Hgb A1c MFr Bld: 5.7 % — ABNORMAL HIGH (ref <5.7)
MEAN PLASMA GLUCOSE: 117 mg/dL — AB (ref <117)

## 2014-06-04 LAB — MAGNESIUM: Magnesium: 1.8 mg/dL (ref 1.5–2.5)

## 2014-06-04 LAB — HEPATIC FUNCTION PANEL
ALBUMIN: 3.7 g/dL (ref 3.5–5.2)
ALK PHOS: 48 U/L (ref 39–117)
ALT: 21 U/L (ref 0–53)
AST: 26 U/L (ref 0–37)
BILIRUBIN DIRECT: 0.1 mg/dL (ref 0.0–0.3)
BILIRUBIN TOTAL: 0.4 mg/dL (ref 0.2–1.2)
Indirect Bilirubin: 0.3 mg/dL (ref 0.2–1.2)
Total Protein: 6.6 g/dL (ref 6.0–8.3)

## 2014-06-30 ENCOUNTER — Ambulatory Visit (INDEPENDENT_AMBULATORY_CARE_PROVIDER_SITE_OTHER): Payer: Medicare Other | Admitting: Internal Medicine

## 2014-06-30 ENCOUNTER — Encounter: Payer: Self-pay | Admitting: Internal Medicine

## 2014-06-30 VITALS — BP 144/98 | HR 62 | Ht 67.0 in | Wt 198.8 lb

## 2014-06-30 DIAGNOSIS — J310 Chronic rhinitis: Secondary | ICD-10-CM | POA: Diagnosis not present

## 2014-06-30 DIAGNOSIS — Z91018 Allergy to other foods: Secondary | ICD-10-CM

## 2014-06-30 DIAGNOSIS — K219 Gastro-esophageal reflux disease without esophagitis: Secondary | ICD-10-CM

## 2014-06-30 MED ORDER — IPRATROPIUM BROMIDE 0.03 % NA SOLN: NASAL | Status: DC

## 2014-06-30 NOTE — Patient Instructions (Signed)
Script for ipratropium nasal spray to help with drainage

## 2014-06-30 NOTE — Progress Notes (Signed)
06/30/14- 80 yoM former smoker  referred courtesy of Dr. Silverio Lay; allergies; blood test showed allergy to wheat  He describes problems with nasal congestion and sinus pressure intermittently over the past 6 years, attributed to wheat and dust mites. Blames a sense of postnasal drip on "allergy" with some periorbital itching and sneezing. Occasionally yellow-green mucus. Not much headache. Food allergy profile was positive for weed and skin testing was positive for dust mite by Dr. Donneta Romberg. No allergy vaccine. Urticaria 2 or 3 times in his life and some other rashes without clear pattern. May have had some asthma in the past. COPD was diagnosed in 1979 and quit smoking then. Nasal polypectomy. Avoids aspirin but hasn't recognized a connection with polyps or asthma. Previous cervical spine surgery and fundoplication 2. We discussed associated problems as potentially contributing to a sense of fullness in the throat. Some productive and nonproductive cough, sneezing, itching and occasional earache.  Prior to Admission medications   Medication Sig Start Date End Date Taking? Authorizing Provider  Ascorbic Acid (VITAMIN C) 1000 MG tablet Take 1,000 mg by mouth daily.   Yes Historical Provider, MD  aspirin EC 81 MG tablet Take 81 mg by mouth daily.   Yes Historical Provider, MD  B Complex-Folic Acid (B COMPLEX-VITAMIN B12 PO) Take 1 tablet by mouth daily.    Yes Historical Provider, MD  bisoprolol-hydrochlorothiazide (ZIAC) 5-6.25 MG per tablet Take 1 tablet by mouth daily. For BP and Heart 07/25/13  Yes [name], MD  Choline Dihydrogen Citrate (CHOLINE CITRATE) 650 MG TABS Take by mouth.   Yes Historical Provider, MD  clobetasol (TEMOVATE) 0.05 % external solution Apply 1 application topically daily. 11/27/13  Yes Vicie Mutters, PA-C  clopidogrel (PLAVIX) 75 MG tablet TAKE 1 TABLET BY MOUTH ONCE DAILY 02/17/14  Yes [name], MD  Coenzyme Q10 (CO Q 10) 100 MG CAPS Take 100 mg by mouth daily.     Yes Historical Provider, MD  fish oil-omega-3 fatty acids 1000 MG capsule Take 2 g by mouth daily.    Yes Historical Provider, MD  Flaxseed, Linseed, (FLAX SEED OIL) 1000 MG CAPS Take 1,000 mg by mouth 2 (two) times daily.    Yes Historical Provider, MD  fluocinonide (LIDEX) 0.05 % external solution  06/27/13  Yes Historical Provider, MD  folic acid (FOLVITE) 751 MCG tablet Take 800 mcg by mouth daily.   Yes Historical Provider, MD  Magnesium 500 MG TABS Take 500 mg by mouth 2 (two) times daily.   Yes Historical Provider, MD  MELATONIN PO Take 30 mg by mouth daily.   Yes Historical Provider, MD  Multiple Vitamin (MULTIVITAMIN) capsule Take 1 capsule by mouth daily.     Yes Historical Provider, MD  naproxen sodium (ANAPROX) 220 MG tablet Take 220 mg by mouth daily as needed.   Yes Historical Provider, MD  niacin 500 MG tablet Take 500 mg by mouth 2 (two) times daily.   Yes Historical Provider, MD  nitroGLYCERIN (NITROSTAT) 0.4 MG SL tablet Place 0.4 mg under the tongue every 5 (five) minutes as needed. For chest pain.   Yes Historical Provider, MD  OVER THE COUNTER MEDICATION Takes ALA(alpha lipoic)   Yes Historical Provider, MD  Turmeric 500 MG CAPS Take 500 mg by mouth daily.    Yes Historical Provider, MD  Valerian 400 MG CAPS Take 1,200 mg by mouth at bedtime.    Yes Historical Provider, MD  ipratropium (ATROVENT) 0.03 % nasal spray 1-2 puffs each nostril every 6  hours if needed, before meals 06/30/14   Deneise Lever, MD   Past Medical History  Diagnosis Date  . CAD (coronary artery disease)     pt last left heart cath was in Nov 2008. EF was 55% on left ventriculogram. Circumflex was totally occluded after the 1st obtuse marginal with collaterals supplying the distal circumflex. LAD showed luminal irregularities. The stent in LAD was patent. The RCA showed luminal irregularities. The stent in th emid RCA and PDA were patent. There were no inverventions.   . Obesity   . Low back pain      chronic  . GERD (gastroesophageal reflux disease)     hiatal hernia. Patient did hav ea Nissen fundoplication  . Peptic ulcer disease   . DM2 (diabetes mellitus, type 2)     well controlled  . Spinal fracture     hx of traumatic in Jan 2009 after falling off the roof  . Chronic obstructive pulmonary disease     asthma  . Benign prostatic hypertrophy   . Osteoarthritis   . Anxiety     treated /w Xanax for mild depression , using 2 times per day, not PRN  . Depression   . Asthma   . Sleep concern     states the study (2003 )was done at [facility]., it failed & he was told to return & he never did. Pt. states he has been found by prev. hosp. staff that he has to be told when to breathe & his wife does the same.   . Cluster headache     relative to sinus problems   . Cluster headache   . History of blood transfusion     need for Bld. transfusion relative to taking NSAIDS  . Cancer     basal cell- face & head  . 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 HTN (hypertension)     pt. followed by [facility], last cardiac visit 2012, one yr. ago   Past Surgical History  Procedure Laterality Date  . Hiatal hernia repair    . Lumbar spine surgery      x4  . Right temporal artery biopsy    . C5-t6 posterior fusion      with [place] and radius screws  . Right carpal tunnel release    . Esophageal manometry    . Laparoscopic cholecystectomy      & IOC  . Umbilical hernia repair    . Multiple percutaneous coronary interventions    . Back surgery      x3- last surgery lumbar- 1998- / fusion   . Cataracts      cataracts removed- /w IOL - both eyes   . Blepheroplasty      both eyes  . Eye surgery    . Nasal sinus surgery      x2, sees Dr. [name], still having problems, states he uses Benadryl PRN- up to 5 times per day   . Cardiac catheterization      2008 & 2012 multiple stents    . Hardware removal  02/20/2012     Procedure: HARDWARE REMOVAL;  Surgeon: [name], MD;  Location: [place] [facility];  Service: Neurosurgery;  Laterality: N/A;  Hardware Removal   Family History  Problem Relation Age of Onset  . Heart failure Mother     in her 25s  . Coronary artery disease Father  developed in his 68s  . Heart attack Brother     in there 21s  . Heart attack Brother     in there 17s  . Gout Sister    History   Social History  . Marital Status: Married    Spouse Name: N/A    Number of Children: N/A  . Years of Education: N/A   Occupational History  . Not on file.   Social History Main Topics  . Smoking status: Former Smoker    Quit date: 02/14/1978  . Smokeless tobacco: Not on file  . Alcohol Use: 0.0 oz/week    0 Not specified per week     Comment: rare  . Drug Use: No  . Sexual Activity: Not on file   Other Topics Concern  . Not on file   Social History Narrative   ROS-see HPI Constitutional:   No-   weight loss, night sweats, fevers, chills, fatigue, lassitude. HEENT:   No-  headaches, difficulty swallowing, tooth/dental problems, sore throat,       No-  sneezing, itching, ear ache, nasal congestion, +post nasal drip,  CV:  No-   chest pain, orthopnea, PND, swelling in lower extremities, anasarca,                                  dizziness, palpitations Resp: No-   shortness of breath with exertion or at rest.              No-   productive cough,  No non-productive cough,  No- coughing up of blood.              No-   change in color of mucus.  No- wheezing.   Skin: No-   rash or lesions. GI:  No-   heartburn, indigestion, abdominal pain, nausea, vomiting, diarrhea,                 change in bowel habits, loss of appetite GU: No-   dysuria, change in color of urine, no urgency or frequency.  No- flank pain. MS:  No-   joint pain or swelling.  No- decreased range of motion.  No- back pain. Neuro-     nothing unusual Psych:  No- change in mood or affect. No depression or anxiety.   No memory loss.  OBJ- Physical Exam General- Alert, Oriented, Affect-appropriate, Distress- none acute Skin- rash-none, lesions- none, excoriation- none Lymphadenopathy- none Head- atraumatic            Eyes- Gross vision intact, PERRLA, conjunctivae and secretions clear            Ears- Hearing, canals-normal            Nose- Clear, no-Septal dev, mucus, polyps, erosion, perforation,                   + old nasal fx slightly displaced externally not obstructed            Throat- Mallampati II , mucosa clear , drainage + clear mucus, tonsils-                   atrophic Neck- flexible , trachea midline, no stridor , thyroid nl, carotid no bruit Chest - symmetrical excursion , unlabored           Heart/CV- RRR , no murmur , no gallop  , no rub, nl s1 s2                           -  JVD- none , edema- none, stasis changes- none, varices- none           Lung- clear to P&A, wheeze- none, cough- none , dullness-none, rub- none           Chest wall-  Abd- tender-no, distended-no, bowel sounds-present, HSM- no Br/ Gen/ Rectal- Not done, not indicated Extrem- cyanosis- none, clubbing, none, atrophy- none, strength- nl Neuro- grossly intact to observation

## 2014-07-01 DIAGNOSIS — J310 Chronic rhinitis: Secondary | ICD-10-CM | POA: Insufficient documentation

## 2014-07-01 DIAGNOSIS — Z91018 Allergy to other foods: Secondary | ICD-10-CM | POA: Insufficient documentation

## 2014-07-01 NOTE — Assessment & Plan Note (Signed)
Discussed GERD control. GERD can sometimes cause discomfort in the back of the throat which is interpreted as postnasal drai.nage

## 2014-07-01 NOTE — Assessment & Plan Note (Addendum)
I don't get impression that Gliaden  allergy/celiac disease is likely. Symptoms might be slightly improved subjectively since he has begun reducing week and his diet.

## 2014-07-01 NOTE — Assessment & Plan Note (Signed)
Is possible to have a perennial rhinitis just from dust exposure but he doesn't describe a significant dust control problem in the home. 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 He notices some association with foods which may be nonspecific.  Plan plan-try ipratropium nasal spray first. Consider Dymista as an alternative later

## 2014-07-10 DIAGNOSIS — L309 Dermatitis, unspecified: Secondary | ICD-10-CM | POA: Diagnosis not present

## 2014-07-29 ENCOUNTER — Other Ambulatory Visit: Payer: Self-pay | Admitting: Internal Medicine

## 2014-08-29 ENCOUNTER — Encounter: Payer: Self-pay | Admitting: Internal Medicine

## 2014-08-29 ENCOUNTER — Ambulatory Visit (INDEPENDENT_AMBULATORY_CARE_PROVIDER_SITE_OTHER): Payer: Medicare Other | Admitting: Internal Medicine

## 2014-08-29 VITALS — BP 124/74 | HR 61 | Ht 67.0 in | Wt 194.4 lb

## 2014-08-29 DIAGNOSIS — J310 Chronic rhinitis: Secondary | ICD-10-CM

## 2014-08-29 DIAGNOSIS — Z91018 Allergy to other foods: Secondary | ICD-10-CM

## 2014-08-29 NOTE — Patient Instructions (Signed)
Ok to continue ipratropium/ Atrovent nasal spray as needed, and avoid wheat since you find that helps.

## 2014-08-29 NOTE — Progress Notes (Signed)
06/30/14- 69 yoM former smoker  referred courtesy of Dr. Silverio Lay; allergies; blood test showed allergy to wheat  He describes problems with nasal congestion and sinus pressure intermittently over the past 6 years, attributed to wheat and dust mites. Blames a sense of postnasal drip on "allergy" with some periorbital itching and sneezing. Occasionally yellow-green mucus. Not much headache. Food allergy profile was positive for weed and skin testing was positive for dust mite by Dr. Donneta Romberg. No allergy vaccine. Urticaria 2 or 3 times in his life and some other rashes without clear pattern. May have had some asthma in the past. COPD was diagnosed in 1979 and quit smoking then. Nasal polypectomy. Avoids aspirin but hasn't recognized a connection with polyps or asthma. Previous cervical spine surgery and fundoplication 2. We discussed associated problems as potentially contributing to a sense of fullness in the throat. Some productive and nonproductive cough, sneezing, itching and occasional earache.  08/29/14- 1 yoM former smoker  referred courtesy of Vicie Mutters, PA-C; allergies; blood test showed allergy to wheat We had suspected VMR and gave ipratropium nasal spray, educated reflux precautions. There had been previous question of gliadin/wheat intolerance.   Allergy profile 03/03/14- Total Ige 98, elevations for dust mite and wheat only.  FOLLOWS FOR: Pt states he now knows wheat is causing his trouble. Pt states he feels the Atrovent NS is helping. He believes that avoiding wheat more carefully improves his nasal drainage.  He has mite encasings on pillows and credits this for fewer upper respiratory infections.  ROS-see HPI Constitutional:   No-   weight loss, night sweats, fevers, chills, fatigue, lassitude. HEENT:   No-  headaches, difficulty swallowing, tooth/dental problems, sore throat,       No-  sneezing, itching, ear ache, nasal congestion, +post nasal drip,  CV:  No-   chest pain,  orthopnea, PND, swelling in lower extremities, anasarca,                                  dizziness, palpitations Resp: No-   shortness of breath with exertion or at rest.              No-   productive cough,  No non-productive cough,  No- coughing up of blood.              No-   change in color of mucus.  No- wheezing.   Skin: No-   rash or lesions. GI:  No-   heartburn, indigestion, abdominal pain, nausea, vomiting,  GU:  MS:  No-   joint pain or swelling.. Neuro-     nothing unusual Psych:  No- change in mood or affect. No depression or anxiety.  No memory loss.  OBJ- Physical Exam General- Alert, Oriented, Affect-appropriate, Distress- none acute Skin- rash-none, lesions- none, excoriation- none Lymphadenopathy- none Head- atraumatic            Eyes- Gross vision intact, PERRLA, conjunctivae and secretions clear            Ears- Hearing, canals-normal            Nose- Clear, no-Septal dev, mucus, polyps, erosion, perforation,                   + old nasal fx slightly displaced externally not obstructed            Throat- Mallampati II , mucosa clear , drainage + clear mucus,  tonsils-                   atrophic Neck- flexible , trachea midline, no stridor , thyroid nl, carotid no bruit Chest - symmetrical excursion , unlabored           Heart/CV- RRR , no murmur , no gallop  , no rub, nl s1 s2                           - JVD- none , edema- none, stasis changes- none, varices- none           Lung- clear to P&A, wheeze- none, cough- none , dullness-none, rub- none           Chest wall-  Abd-  Br/ Gen/ Rectal- Not done, not indicated Extrem- cyanosis- none, clubbing, none, atrophy- none, strength- nl Neuro- grossly intact to observation

## 2014-08-31 NOTE — Assessment & Plan Note (Signed)
He is satisfied to reduce exposure to wheat. I don't think this is a celiac problem and I'm not convinced that he has a significant wheat allergy

## 2014-08-31 NOTE — Assessment & Plan Note (Signed)
There may be dust mite allergy and also nonallergic triggers for his rhinitis. He finds Atrovent nasal spray quite helpful and sufficient. He believes that eating wheat, including recently a biscuit, makes his nose run more. I doubt this is true food allergy but okay for him to try to reduce wheat in his diet and see what that helps.

## 2014-09-01 DIAGNOSIS — H04123 Dry eye syndrome of bilateral lacrimal glands: Secondary | ICD-10-CM | POA: Diagnosis not present

## 2014-09-01 DIAGNOSIS — H00023 Hordeolum internum right eye, unspecified eyelid: Secondary | ICD-10-CM | POA: Diagnosis not present

## 2014-09-01 DIAGNOSIS — Z961 Presence of intraocular lens: Secondary | ICD-10-CM | POA: Diagnosis not present

## 2014-09-02 ENCOUNTER — Encounter: Payer: Self-pay | Admitting: Physician Assistant

## 2014-09-02 ENCOUNTER — Ambulatory Visit (INDEPENDENT_AMBULATORY_CARE_PROVIDER_SITE_OTHER): Payer: Medicare Other | Admitting: Physician Assistant

## 2014-09-02 VITALS — BP 128/72 | HR 60 | Temp 97.7°F | Resp 18 | Ht 67.0 in | Wt 194.6 lb

## 2014-09-02 DIAGNOSIS — Z79899 Other long term (current) drug therapy: Secondary | ICD-10-CM

## 2014-09-02 DIAGNOSIS — H0013 Chalazion right eye, unspecified eyelid: Secondary | ICD-10-CM

## 2014-09-02 DIAGNOSIS — N182 Chronic kidney disease, stage 2 (mild): Secondary | ICD-10-CM | POA: Diagnosis not present

## 2014-09-02 DIAGNOSIS — E559 Vitamin D deficiency, unspecified: Secondary | ICD-10-CM

## 2014-09-02 DIAGNOSIS — E782 Mixed hyperlipidemia: Secondary | ICD-10-CM

## 2014-09-02 DIAGNOSIS — E669 Obesity, unspecified: Secondary | ICD-10-CM | POA: Diagnosis not present

## 2014-09-02 DIAGNOSIS — E1122 Type 2 diabetes mellitus with diabetic chronic kidney disease: Secondary | ICD-10-CM | POA: Diagnosis not present

## 2014-09-02 DIAGNOSIS — E1129 Type 2 diabetes mellitus with other diabetic kidney complication: Secondary | ICD-10-CM | POA: Diagnosis not present

## 2014-09-02 DIAGNOSIS — I10 Essential (primary) hypertension: Secondary | ICD-10-CM

## 2014-09-02 DIAGNOSIS — H04301 Unspecified dacryocystitis of right lacrimal passage: Secondary | ICD-10-CM

## 2014-09-02 LAB — CBC WITH DIFFERENTIAL/PLATELET
BASOS ABS: 0 10*3/uL (ref 0.0–0.1)
BASOS PCT: 0 % (ref 0–1)
Eosinophils Absolute: 0.3 10*3/uL (ref 0.0–0.7)
Eosinophils Relative: 3 % (ref 0–5)
HCT: 47.4 % (ref 39.0–52.0)
Hemoglobin: 16.5 g/dL (ref 13.0–17.0)
Lymphocytes Relative: 22 % (ref 12–46)
Lymphs Abs: 2 10*3/uL (ref 0.7–4.0)
MCH: 30.8 pg (ref 26.0–34.0)
MCHC: 34.8 g/dL (ref 30.0–36.0)
MCV: 88.4 fL (ref 78.0–100.0)
MPV: 9.8 fL (ref 8.6–12.4)
Monocytes Absolute: 0.6 10*3/uL (ref 0.1–1.0)
Monocytes Relative: 7 % (ref 3–12)
NEUTROS PCT: 68 % (ref 43–77)
Neutro Abs: 6.1 10*3/uL (ref 1.7–7.7)
PLATELETS: 176 10*3/uL (ref 150–400)
RBC: 5.36 MIL/uL (ref 4.22–5.81)
RDW: 14.2 % (ref 11.5–15.5)
WBC: 8.9 10*3/uL (ref 4.0–10.5)

## 2014-09-02 LAB — HEMOGLOBIN A1C
Hgb A1c MFr Bld: 5.7 % — ABNORMAL HIGH (ref <5.7)
Mean Plasma Glucose: 117 mg/dL — ABNORMAL HIGH (ref <117)

## 2014-09-02 MED ORDER — NEOMYCIN-POLYMYXIN-DEXAMETH 0.1 % OP SUSP
1.0000 [drp] | Freq: Four times a day (QID) | OPHTHALMIC | Status: DC
Start: 2014-09-02 — End: 2014-10-23

## 2014-09-02 MED ORDER — DOXYCYCLINE HYCLATE 100 MG PO CAPS: ORAL_CAPSULE | ORAL | Status: DC

## 2014-09-02 NOTE — Patient Instructions (Signed)

## 2014-09-02 NOTE — Progress Notes (Signed)
Assessment and Plan:  1. Hypertension -Continue medication, monitor blood pressure at home. Continue DASH diet.  Reminder to go to the ER if any CP, SOB, nausea, dizziness, severe HA, changes vision/speech, left arm numbness and tingling and jaw pain.  2. Cholesterol -Continue diet and exercise. Check cholesterol.   3. Prediabetes  -Continue diet and exercise. Check A1C  4. Vitamin D Def - check level and continue medications.   5. Stye/chalizon infected ABX drops, doxycycline sent to [facility], do warm wet compresses, follow up with Dr. Katy Fitch if not better for possible I&D  Continue diet and meds as discussed. Further disposition pending results of labs. Over 30 minutes of exam, counseling, chart review, and critical decision making was performed  HPI 69 y.o. male  presents for 3 month follow up on hypertension, cholesterol, prediabetes, and vitamin D deficiency.   His blood pressure has been controlled at home, today their BP is BP: 128/72 mmHg  He does workout. He denies chest pain, shortness of breath, dizziness.  He is on cholesterol medication and denies myalgias. His cholesterol is at goal. The cholesterol last visit was:   Lab Results  Component Value Date   CHOL 178 06/03/2014   HDL 35* 06/03/2014   LDLCALC 109* 06/03/2014   LDLDIRECT 116.0 06/03/2013   TRIG 169* 06/03/2014   CHOLHDL 5.1 06/03/2014    He has been working on diet and exercise for prediabetes, and denies paresthesia of the feet, polydipsia, polyuria and visual disturbances. Last A1C in the office was:  Lab Results  Component Value Date   HGBA1C 5.7* 06/03/2014   Patient is on Vitamin D supplement.   Lab Results  Component Value Date   VD25OH 2* 03/03/2014     Has been feeling tired for 2 weeks without any symptoms but has been having some sore throat/post nasal drip for the last 2-3 days. He also has been having left eye redness, white bumps on his bottom a week ago that has gotten progressively worse  white bumps on the top. He has some white discharge this AM with his eye being stuck shut, put warm wet cloth on it this AM that helps the pain some. + blurry vision in the AM but gets better.  No contact, no history of glacoma. He has had mild frontal HA and some frontal sinus pressure. He follows with Dr. Katy Fitch, last saw him in August and had normal exam, did go to see someone at his office yesterday, said had an infection and not given any medications.   Current Medications:  Current Outpatient Prescriptions on File Prior to Visit  Medication Sig Dispense Refill  . Ascorbic Acid (VITAMIN C) 1000 MG tablet Take 1,000 mg by mouth daily.    Marland Kitchen aspirin EC 81 MG tablet Take 81 mg by mouth daily.    . B Complex-Folic Acid (B COMPLEX-VITAMIN B12 PO) Take 1 tablet by mouth daily.     . bisoprolol-hydrochlorothiazide (ZIAC) 2.5-6.25 MG per tablet Take 1 tablet by mouth daily.    . Choline Dihydrogen Citrate (CHOLINE CITRATE) 650 MG TABS Take by mouth.    . clobetasol (TEMOVATE) 0.05 % external solution Apply 1 application topically daily. 50 mL 0  . clopidogrel (PLAVIX) 75 MG tablet TAKE 1 TABLET BY MOUTH ONCE DAILY 30 tablet 99  . Coenzyme Q10 (CO Q 10) 100 MG CAPS Take 100 mg by mouth daily.     . fish oil-omega-3 fatty acids 1000 MG capsule Take 2 g by mouth daily.     Marland Kitchen  Flaxseed, Linseed, (FLAX SEED OIL) 1000 MG CAPS Take 1,000 mg by mouth 2 (two) times daily.     . fluocinonide (LIDEX) 0.05 % external solution     . folic acid (FOLVITE) 409 MCG tablet Take 800 mcg by mouth daily.    Marland Kitchen ipratropium (ATROVENT) 0.03 % nasal spray 1-2 puffs each nostril every 6 hours if needed, before meals 30 mL 12  . Magnesium 500 MG TABS Take 500 mg by mouth 2 (two) times daily.    Marland Kitchen MELATONIN PO Take 30 mg by mouth daily.    . Multiple Vitamin (MULTIVITAMIN) capsule Take 1 capsule by mouth daily.      . naproxen sodium (ANAPROX) 220 MG tablet Take 220 mg by mouth daily as needed.    . niacin 500 MG tablet Take 500  mg by mouth 2 (two) times daily.    . nitroGLYCERIN (NITROSTAT) 0.4 MG SL tablet Place 0.4 mg under the tongue every 5 (five) minutes as needed. For chest pain.    Marland Kitchen OVER THE COUNTER MEDICATION Takes ALA(alpha lipoic)    . Turmeric 500 MG CAPS Take 500 mg by mouth daily.      No current facility-administered medications on file prior to visit.   Medical History:  Past Medical History  Diagnosis Date  . CAD (coronary artery disease)     pt last left heart cath was in Nov 2008. EF was 55% on left ventriculogram. Circumflex was totally occluded after the 1st obtuse marginal with collaterals supplying the distal circumflex. LAD showed luminal irregularities. The stent in LAD was patent. The RCA showed luminal irregularities. The stent in th emid RCA and PDA were patent. There were no inverventions.   . Obesity   . Low back pain     chronic  . GERD (gastroesophageal reflux disease)     hiatal hernia. Patient did hav ea Nissen fundoplication  . Peptic ulcer disease   . DM2 (diabetes mellitus, type 2)     well controlled  . Spinal fracture     hx of traumatic in Jan 2009 after falling off the roof  . Chronic obstructive pulmonary disease     asthma  . Benign prostatic hypertrophy   . Osteoarthritis   . Anxiety     treated /w Xanax for mild depression , using 2 times per day, not PRN  . Depression   . Asthma   . Sleep concern     states the study (2003 )was done at [facility] it failed & he was told to return & he never did. Pt. states he has been found by prev. hosp. staff that he has to be told when to breathe & his wife does the same.   . Cluster headache     relative to sinus problems   . Cluster headache   . History of blood transfusion     need for Bld. transfusion relative to taking NSAIDS  . Cancer     basal cell- face & head  . 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 HTN (hypertension)     pt. followed by  [facility], last cardiac visit 2012, one yr. ago   Allergies:  Allergies  Allergen Reactions  . Adhesive [Tape]     SKIN PEELS  . Codeine Itching    Also hallucinations   . Crestor [Rosuvastatin]   . Cymbalta [Duloxetine Hcl]   . Dilaudid [  Hydromorphone Hcl]   . Effexor [Venlafaxine]   . Morphine And Related Other (See Comments)    Hallucinations  . Nsaids Other (See Comments)    BLEEDING  . Percocet [Oxycodone-Acetaminophen]   . Sulfa Antibiotics   . Topamax [Topiramate]   . Trazodone And Nefazodone   . Penicillins Rash    Review of Systems:  Review of Systems  Constitutional: Negative for fever, chills, weight loss and diaphoresis.  HENT: Positive for congestion and sore throat. Negative for ear discharge, ear pain, hearing loss, nosebleeds and tinnitus.   Eyes: Positive for blurred vision, discharge and redness. Negative for double vision, photophobia and pain.  Respiratory: Negative.  Negative for stridor.   Cardiovascular: Negative.   Gastrointestinal: Negative.   Genitourinary: Negative.   Musculoskeletal: Negative.   Skin: Negative.   Neurological: Positive for headaches. Negative for dizziness, tingling, tremors, sensory change, speech change, focal weakness, seizures, loss of consciousness and weakness.  Psychiatric/Behavioral: Negative.     Family history- Review and unchanged Social history- Review and unchanged Physical Exam: BP 128/72 mmHg  Pulse 60  Temp(Src) 97.7 F (36.5 C)  Resp 18  Ht 5\' 7"  (1.702 m)  Wt 194 lb 9.6 oz (88.27 kg)  BMI 30.47 kg/m2 Wt Readings from Last 3 Encounters:  09/02/14 194 lb 9.6 oz (88.27 kg)  08/29/14 194 lb 6.4 oz (88.179 kg)  06/30/14 198 lb 12.8 oz (90.175 kg)   General Appearance: Well nourished, in no apparent distress. Eyes: negative findings: corneas clear, pupils equal, round, reactive to light and accomodation and no foreign body with everted lid, positive findings: eyelids/periorbital: internal hordeolum  on the right, ptosis on the right, lacrimal gland swelling on the right and periorbital edema on the right and conjunctiva: right injection Sinuses: No Frontal/maxillary tenderness ENT/Mouth: Ext aud canals clear, TMs without erythema, bulging. No erythema, swelling, or exudate on post pharynx.  Tonsils not swollen or erythematous. Hearing normal.  Neck: Supple, thyroid normal.  Respiratory: Respiratory effort normal, BS equal bilaterally without rales, rhonchi, wheezing or stridor.  Cardio: RRR with no MRGs. Brisk peripheral pulses without edema.  Abdomen: Soft, + BS,  Non tender, no guarding, rebound, hernias, masses. Lymphatics: Non tender without lymphadenopathy.  Musculoskeletal: Full ROM, 5/5 strength, Normal gait Skin: Warm, dry without rashes, lesions, ecchymosis.  Neuro: Cranial nerves intact. Normal muscle tone, no cerebellar symptoms. Psych: Awake and oriented X 3, normal affect, Insight and Judgment appropriate.    Vicie Mutters, PA-C 11:36 AM [facility]

## 2014-09-03 LAB — LIPID PANEL
Cholesterol: 190 mg/dL (ref 0–200)
HDL: 41 mg/dL (ref >=40)
LDL CALC: 111 mg/dL — AB (ref 0–99)
Total CHOL/HDL Ratio: 4.6 Ratio
Triglycerides: 191 mg/dL — ABNORMAL HIGH (ref <150)
VLDL: 38 mg/dL (ref 0–40)

## 2014-09-03 LAB — HEPATIC FUNCTION PANEL
ALT: 19 U/L (ref 0–53)
AST: 22 U/L (ref 0–37)
Albumin: 4 g/dL (ref 3.5–5.2)
Alkaline Phosphatase: 56 U/L (ref 39–117)
BILIRUBIN INDIRECT: 0.5 mg/dL (ref 0.2–1.2)
Bilirubin, Direct: 0.1 mg/dL (ref 0.0–0.3)
TOTAL PROTEIN: 6.8 g/dL (ref 6.0–8.3)
Total Bilirubin: 0.6 mg/dL (ref 0.2–1.2)

## 2014-09-03 LAB — TSH: TSH: 1.172 u[IU]/mL (ref 0.350–4.500)

## 2014-09-03 LAB — BASIC METABOLIC PANEL WITH GFR
BUN: 17 mg/dL (ref 6–23)
CALCIUM: 9.7 mg/dL (ref 8.4–10.5)
CO2: 28 mEq/L (ref 19–32)
CREATININE: 0.95 mg/dL (ref 0.50–1.35)
Chloride: 99 mEq/L (ref 96–112)
GFR, Est African American: >89 mL/min
GFR, Est Non African American: 81 mL/min
Glucose, Bld: 94 mg/dL (ref 70–99)
Potassium: 4.4 mEq/L (ref 3.5–5.3)
Sodium: 137 mEq/L (ref 135–145)

## 2014-09-03 LAB — MAGNESIUM: MAGNESIUM: 1.9 mg/dL (ref 1.5–2.5)

## 2014-09-03 LAB — INSULIN, FASTING: Insulin fasting, serum: 8.5 u[IU]/mL (ref 2.0–19.6)

## 2014-09-17 DIAGNOSIS — H00021 Hordeolum internum right upper eyelid: Secondary | ICD-10-CM | POA: Diagnosis not present

## 2014-10-22 DIAGNOSIS — L821 Other seborrheic keratosis: Secondary | ICD-10-CM | POA: Diagnosis not present

## 2014-10-22 DIAGNOSIS — Z85828 Personal history of other malignant neoplasm of skin: Secondary | ICD-10-CM | POA: Diagnosis not present

## 2014-10-22 DIAGNOSIS — L570 Actinic keratosis: Secondary | ICD-10-CM | POA: Diagnosis not present

## 2014-10-22 DIAGNOSIS — D1801 Hemangioma of skin and subcutaneous tissue: Secondary | ICD-10-CM | POA: Diagnosis not present

## 2014-10-23 ENCOUNTER — Ambulatory Visit (INDEPENDENT_AMBULATORY_CARE_PROVIDER_SITE_OTHER): Payer: Medicare Other | Admitting: Internal Medicine

## 2014-10-23 ENCOUNTER — Encounter: Payer: Self-pay | Admitting: Internal Medicine

## 2014-10-23 VITALS — BP 138/78 | HR 60 | Temp 97.7°F | Resp 16 | Ht 67.0 in | Wt 191.0 lb

## 2014-10-23 DIAGNOSIS — Z79899 Other long term (current) drug therapy: Secondary | ICD-10-CM | POA: Diagnosis not present

## 2014-10-23 DIAGNOSIS — G4710 Hypersomnia, unspecified: Secondary | ICD-10-CM | POA: Diagnosis not present

## 2014-10-23 DIAGNOSIS — R5382 Chronic fatigue, unspecified: Secondary | ICD-10-CM | POA: Diagnosis not present

## 2014-10-23 DIAGNOSIS — E349 Endocrine disorder, unspecified: Secondary | ICD-10-CM

## 2014-10-23 DIAGNOSIS — G4730 Sleep apnea, unspecified: Secondary | ICD-10-CM

## 2014-10-23 DIAGNOSIS — E291 Testicular hypofunction: Secondary | ICD-10-CM | POA: Diagnosis not present

## 2014-10-23 DIAGNOSIS — E441 Mild protein-calorie malnutrition: Secondary | ICD-10-CM

## 2014-10-23 LAB — HEPATIC FUNCTION PANEL
ALBUMIN: 3.8 g/dL (ref 3.5–5.2)
ALK PHOS: 53 U/L (ref 39–117)
ALT: 20 U/L (ref 0–53)
AST: 27 U/L (ref 0–37)
Bilirubin, Direct: 0.2 mg/dL (ref 0.0–0.3)
Indirect Bilirubin: 0.5 mg/dL (ref 0.2–1.2)
TOTAL PROTEIN: 6.7 g/dL (ref 6.0–8.3)
Total Bilirubin: 0.7 mg/dL (ref 0.2–1.2)

## 2014-10-23 LAB — CBC WITH DIFFERENTIAL/PLATELET
Basophils Absolute: 0 10*3/uL (ref 0.0–0.1)
Basophils Relative: 1 % (ref 0–1)
EOS ABS: 0.1 10*3/uL (ref 0.0–0.7)
Eosinophils Relative: 2 % (ref 0–5)
HCT: 46.5 % (ref 39.0–52.0)
HEMOGLOBIN: 16.2 g/dL (ref 13.0–17.0)
LYMPHS ABS: 1.6 10*3/uL (ref 0.7–4.0)
Lymphocytes Relative: 34 % (ref 12–46)
MCH: 30.5 pg (ref 26.0–34.0)
MCHC: 34.8 g/dL (ref 30.0–36.0)
MCV: 87.6 fL (ref 78.0–100.0)
MONO ABS: 0.5 10*3/uL (ref 0.1–1.0)
MPV: 9.8 fL (ref 8.6–12.4)
Monocytes Relative: 11 % (ref 3–12)
Neutro Abs: 2.4 10*3/uL (ref 1.7–7.7)
Neutrophils Relative %: 52 % (ref 43–77)
Platelets: 162 10*3/uL (ref 150–400)
RBC: 5.31 MIL/uL (ref 4.22–5.81)
RDW: 14.6 % (ref 11.5–15.5)
WBC: 4.6 10*3/uL (ref 4.0–10.5)

## 2014-10-23 LAB — BASIC METABOLIC PANEL WITH GFR
BUN: 16 mg/dL (ref 6–23)
CHLORIDE: 101 meq/L (ref 96–112)
CO2: 21 meq/L (ref 19–32)
Calcium: 9.2 mg/dL (ref 8.4–10.5)
Creat: 0.98 mg/dL (ref 0.50–1.35)
GFR, EST NON AFRICAN AMERICAN: 78 mL/min
GFR, Est African American: >89 mL/min
Glucose, Bld: 86 mg/dL (ref 70–99)
Potassium: 4 mEq/L (ref 3.5–5.3)
SODIUM: 136 meq/L (ref 135–145)

## 2014-10-23 LAB — MAGNESIUM: Magnesium: 1.8 mg/dL (ref 1.5–2.5)

## 2014-10-23 NOTE — Patient Instructions (Signed)
Fatigue Fatigue is a feeling of tiredness, lack of energy, lack of motivation, or feeling tired all the time. Having enough rest, good nutrition, and reducing stress will normally reduce fatigue. Consult your caregiver if it persists. The nature of your fatigue will help your caregiver to find out its cause. The treatment is based on the cause.  CAUSES  There are many causes for fatigue. Most of the time, fatigue can be traced to one or more of your habits or routines. Most causes fit into one or more of three general areas. They are: Lifestyle problems  Sleep disturbances.  Overwork.  Physical exertion.  Unhealthy habits.  Poor eating habits or eating disorders.  Alcohol and/or drug use .  Lack of proper nutrition (malnutrition). Psychological problems 1. Stress and/or anxiety problems. 2. Depression. 3. Grief. 4. Boredom. Medical Problems or Conditions  Anemia.  Pregnancy.  Thyroid gland problems.  Recovery from major surgery.  Continuous pain.  Emphysema or asthma that is not well controlled  Allergic conditions.  Diabetes.  Infections (such as mononucleosis).  Obesity.  Sleep disorders, such as sleep apnea.  Heart failure or other heart-related problems.  Cancer.  Kidney disease.  Liver disease.  Effects of certain medicines such as antihistamines, cough and cold remedies, prescription pain medicines, heart and blood pressure medicines, drugs used for treatment of cancer, and some antidepressants. SYMPTOMS  The symptoms of fatigue include:   Lack of energy.  Lack of drive (motivation).  Drowsiness.  Feeling of indifference to the surroundings. DIAGNOSIS  The details of how you feel help guide your caregiver in finding out what is causing the fatigue. You will be asked about your present and past health condition. It is important to review all medicines that you take, including prescription and non-prescription items. A thorough exam will be  done. You will be questioned about your feelings, habits, and normal lifestyle. Your caregiver may suggest blood tests, urine tests, or other tests to look for common medical causes of fatigue.  TREATMENT  Fatigue is treated by correcting the underlying cause. For example, if you have continuous pain or depression, treating these causes will improve how you feel. Similarly, adjusting the dose of certain medicines will help in reducing fatigue.  HOME CARE INSTRUCTIONS   Try to get the required amount of good sleep every night.  Eat a healthy and nutritious diet, and drink enough water throughout the day.  Practice ways of relaxing (including yoga or meditation).  Exercise regularly.  Make plans to change situations that cause stress. Act on those plans so that stresses decrease over time. Keep your work and personal routine reasonable.  Avoid street drugs and minimize use of alcohol.  Start taking a daily multivitamin after consulting your caregiver. SEEK MEDICAL CARE IF:   You have persistent tiredness, which cannot be accounted for.  You have fever.  You have unintentional weight loss.  You have headaches.  You have disturbed sleep throughout the night.  You are feeling sad.  You have constipation.  You have dry skin.  You have gained weight.  You are taking any new or different medicines that you suspect are causing fatigue.  You are unable to sleep at night.  You develop any unusual swelling of your legs or other parts of your body. SEEK IMMEDIATE MEDICAL CARE IF:   You are feeling confused.  Your vision is blurred.  You feel faint or pass out.  You develop severe headache.  You develop severe abdominal, pelvic, or  back pain.  You develop chest pain, shortness of breath, or an irregular or fast heartbeat.  You are unable to pass a normal amount of urine.  You develop abnormal bleeding such as bleeding from the rectum or you vomit blood.  You have  thoughts about harming yourself or committing suicide.  You are worried that you might harm someone else. MAKE SURE YOU:   Understand these instructions.  Will watch your condition.  Will get help right away if you are not doing well or get worse.  +++++++++++++++++++++++++++++++  Sleep Apnea  Sleep apnea is a sleep disorder characterized by abnormal pauses in breathing while you sleep. When your breathing pauses, the level of oxygen in your blood decreases. This causes you to move out of deep sleep and into light sleep. As a result, your quality of sleep is poor, and the system that carries your blood throughout your body (cardiovascular system) experiences stress. If sleep apnea remains untreated, the following conditions can develop:  High blood pressure (hypertension).  Coronary artery disease.  Inability to achieve or maintain an erection (impotence).  Impairment of your thought process (cognitive dysfunction). There are three types of sleep apnea: 5. Obstructive sleep apnea--Pauses in breathing during sleep because of a blocked airway. 6. Central sleep apnea--Pauses in breathing during sleep because the area of the brain that controls your breathing does not send the correct signals to the muscles that control breathing. 7. Mixed sleep apnea--A combination of both obstructive and central sleep apnea. RISK FACTORS The following risk factors can increase your risk of developing sleep apnea:  Being overweight.  Smoking.  Having narrow passages in your nose and throat.  Being of older age.  Being male.  Alcohol use.  Sedative and tranquilizer use.  Ethnicity. Among individuals younger than 35 years, African Americans are at increased risk of sleep apnea. SYMPTOMS   Difficulty staying asleep.  Daytime sleepiness and fatigue.  Loss of energy.  Irritability.  Loud, heavy snoring.  Morning headaches.  Trouble concentrating.  Forgetfulness.  Decreased  interest in sex. DIAGNOSIS  In order to diagnose sleep apnea, your caregiver will perform a physical examination. Your caregiver may suggest that you take a home sleep test. Your caregiver may also recommend that you spend the night in a sleep lab. In the sleep lab, several monitors record information about your heart, lungs, and brain while you sleep. Your leg and arm movements and blood oxygen level are also recorded. TREATMENT The following actions may help to resolve mild sleep apnea:  Sleeping on your side.   Using a decongestant if you have nasal congestion.   Avoiding the use of depressants, including alcohol, sedatives, and narcotics.   Losing weight and modifying your diet if you are overweight. There also are devices and treatments to help open your airway:  Oral appliances. These are custom-made mouthpieces that shift your lower jaw forward and slightly open your bite. This opens your airway.  Devices that create positive airway pressure. This positive pressure "splints" your airway open to help you breathe better during sleep. The following devices create positive airway pressure:  Continuous positive airway pressure (CPAP) device. The CPAP device creates a continuous level of air pressure with an air pump. The air is delivered to your airway through a mask while you sleep. This continuous pressure keeps your airway open.  Nasal expiratory positive airway pressure (EPAP) device. The EPAP device creates positive air pressure as you exhale. The device consists of single-use valves, which  are inserted into each nostril and held in place by adhesive. The valves create very little resistance when you inhale but create much more resistance when you exhale. That increased resistance creates the positive airway pressure. This positive pressure while you exhale keeps your airway open, making it easier to breath when you inhale again.  Bilevel positive airway pressure (BPAP) device. The  BPAP device is used mainly in patients with central sleep apnea. This device is similar to the CPAP device because it also uses an air pump to deliver continuous air pressure through a mask. However, with the BPAP machine, the pressure is set at two different levels. The pressure when you exhale is lower than the pressure when you inhale.  Surgery. Typically, surgery is only done if you cannot comply with less invasive treatments or if the less invasive treatments do not improve your condition. Surgery involves removing excess tissue in your airway to create a wider passage way. Document Released: 05/20/2002 Document Revised: 09/24/2012 Document Reviewed: 10/06/2011 [facility] Patient Information 2015 [place] [employer] [place], [place]. This information is not intended to replace advice given to you by your health care provider. Make sure you discuss any questions you have with your health care provider.

## 2014-10-23 NOTE — Progress Notes (Signed)
Subjective:    Patient ID: [name], male    DOB: Nov 02, 1945, 69 y.o.   MRN: [id number]  HPI  Patient presents witgh c/o excessive fatigue, hypersomnolence and poor sleep hygiene - falling asleep easily and restless all night log and nods of when quiet through out the day. Also reports wife says more forgetful. Does snore and possible apneic. Had unsuccessful attempt ay sleep test in the past.   Medication Sig  . Ascorbic Acid (VITAMIN C) 1000 MG tablet Take 1,000 mg by mouth daily.  Marland Kitchen aspirin EC 81 MG tablet Take 81 mg by mouth daily.  . bisoprolol-hydrochlorothiazide (ZIAC) 2.5-6.25 MG per tablet Take 1 tablet by mouth daily.  . Choline Dihydrogen Citrate (CHOLINE CITRATE) 650 MG TABS Take by mouth.  . clobetasol (TEMOVATE) 0.05 % external solution Apply 1 application topically daily.  . clopidogrel (PLAVIX) 75 MG tablet TAKE 1 TABLET BY MOUTH ONCE DAILY  . Coenzyme Q10 (CO Q 10) 100 MG CAPS Take 100 mg by mouth daily.   . fish oil 1000 MG capsule Take 2 g by mouth daily.   Marland Kitchen FLAX SEED OIL 1000 MG CAPS Take 1,000 mg by mouth 2 (two) times daily.   . fluocinonide (LIDEX) 0.05 % external solution   . folic acid (FOLVITE) 270 MCG tablet Take 800 mcg by mouth daily.  Marland Kitchen ipratropium (ATROVENT) 0.03 % nasal spray 1-2 puffs each nostril every 6 hours if needed, before meals  . Magnesium 500 MG TABS Take 500 mg by mouth 2 (two) times daily.  Marland Kitchen MELATONIN PO Take 30 mg by mouth daily.  . Multiple Vitamin (MULTIVITAMIN) capsule Take 1 capsule by mouth daily.    . naproxen sodium (ANAPROX) 220 MG tablet Take 220 mg by mouth daily as needed.  . niacin 500 MG tablet Take 500 mg by mouth 2 (two) times daily.  . nitroGLYCERIN (NITROSTAT) 0.4 MG SL tablet Place 0.4 mg under the tongue every 5 (five) minutes as needed. For chest pain.  Marland Kitchen OVER THE COUNTER MEDICATION Takes ALA(alpha lipoic)  . Turmeric 500 MG CAPS Take 500 mg by mouth daily.   . B Complex-Folic Acid (B COMPLEX-VITAMIN B12 PO)  Take 1 tablet by mouth daily.   Marland Kitchen doxycycline (VIBRAMYCIN) 100 MG capsule Take 2 capsules at once on a full stomach x 7 days  . neomycin-polymyxin-dexamethasone (MAXITROL) 0.1 % ophthalmic suspension Place 1 drop into the right eye 4 (four) times daily.   Allergies  Allergen Reactions  . Adhesive [Tape]     SKIN PEELS  . Codeine Itching    Also hallucinations   . Crestor [Rosuvastatin]   . Cymbalta [Duloxetine Hcl]   . Dilaudid [Hydromorphone Hcl]   . Effexor [Venlafaxine]   . Morphine And Related Other (See Comments)    Hallucinations  . Nsaids Other (See Comments)    BLEEDING  . Percocet [Oxycodone-Acetaminophen]   . Sulfa Antibiotics   . Topamax [Topiramate]   . Trazodone And Nefazodone   . Penicillins Rash   Past Medical History  Diagnosis Date  . CAD (coronary artery disease)     pt last left heart cath was in Nov 2008. EF was 55% on left ventriculogram. Circumflex was totally occluded after the 1st obtuse marginal with collaterals supplying the distal circumflex. LAD showed luminal irregularities. The stent in LAD was patent. The RCA showed luminal irregularities. The stent in th emid RCA and PDA were patent. There were no inverventions.   . Obesity   .  Low back pain     chronic  . GERD (gastroesophageal reflux disease)     hiatal hernia. Patient did hav ea Nissen fundoplication  . Peptic ulcer disease   . DM2 (diabetes mellitus, type 2)     well controlled  . Spinal fracture     hx of traumatic in Jan 2009 after falling off the roof  . Chronic obstructive pulmonary disease     asthma  . Benign prostatic hypertrophy   . Osteoarthritis   . Anxiety     treated /w Xanax for mild depression , using 2 times per day, not PRN  . Depression   . Asthma   . Sleep concern     states the study (2003 )was done at [facility] it failed & he was told to return & he never did. Pt. states he has been found by prev. hosp. staff that he has to be told when to breathe & his wife  does the same.   . Cluster headache     relative to sinus problems   . Cluster headache   . History of blood transfusion     need for Bld. transfusion relative to taking NSAIDS  . Cancer     basal cell- face & head  . 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 HTN (hypertension)     pt. followed by [facility], last cardiac visit 2012, one yr. ago   Review of Systems 10 point systems review negative except as above.    Objective:   Physical Exam  BP 138/78 mmHg  Pulse 60  Temp(Src) 97.7 F (36.5 C)  Resp 16  Ht '5\' 7"'$  (1.702 m)  Wt 191 lb (86.637 kg)  BMI 29.91 kg/m2  HEENT - Eac's patent. TM's Nl. EOM's full. PERRLA. NasoOroPharynx clear. Neck - supple. Nl Thyroid. Carotids 2+ & No bruits, nodes, JVD Chest - kyphotic . Clear equal BS w/o Rales, rhonchi, wheezes. Cor - Nl HS. RRR w/o sig MGR. PP 1(+). And 1 (+) edema. Abd - No palpable organomegaly, masses or tenderness. BS nl. MS- FROM w/o deformities. Muscle power, tone and bulk Nl. Gait Nl. Neuro - No obvious Cr N abnormalities. Sensory, motor and Cerebellar functions appear Nl w/o focal abnormalities. Psyche - Mental status normal & appropriate.  No delusions, ideations or obvious mood abnormalities.    Assessment & Plan:   1. Chronic fatigue   2. Hypersomnia with sleep apnea  - Nocturnal polysomnography; Future - Ambulatory referral to Sleep Studies  3. Medication management  - CBC with Differential/Platelet - BASIC METABOLIC PANEL WITH GFR - Hepatic function panel - Magnesium  4. Testosterone deficiency  - Testosterone  Plan - pending results above

## 2014-10-24 LAB — TESTOSTERONE: Testosterone: 378 ng/dL (ref 300–890)

## 2014-10-28 ENCOUNTER — Other Ambulatory Visit: Payer: Self-pay | Admitting: Internal Medicine

## 2014-10-29 ENCOUNTER — Other Ambulatory Visit: Payer: Self-pay | Admitting: Internal Medicine

## 2014-10-30 ENCOUNTER — Telehealth: Payer: Self-pay | Admitting: *Deleted

## 2014-10-30 MED ORDER — LEVOFLOXACIN 500 MG PO TABS
500.0000 mg | ORAL_TABLET | Freq: Every day | ORAL | Status: DC
Start: 2014-10-30 — End: 2014-11-17

## 2014-10-30 NOTE — Telephone Encounter (Signed)
Patient called and asked about low magnesium on last labs.  Per Dr Melford Aase, level was in low normal range and needs to increase magnesium as directed on his labs. Patient states he is fatigued and is having hair loss.  Per Dr Melford Aase, keep 1 month appointment with Dr Aundra Dubin and he might consider having a stress test.  As far as hair loss is concerned,  Dr Melford Aase suggest he try hair, skin and nails vitamin.  Patient complained of a sinus infection with some chest congest.ion.  OK to send in an RX for Levaquin 500 mg and suggested patient try [employer] for upcoming plane ride.  Patient aware.

## 2014-11-17 ENCOUNTER — Encounter: Payer: Self-pay | Admitting: Physician Assistant

## 2014-11-17 ENCOUNTER — Ambulatory Visit (INDEPENDENT_AMBULATORY_CARE_PROVIDER_SITE_OTHER): Payer: Medicare Other | Admitting: Physician Assistant

## 2014-11-17 VITALS — BP 118/62 | HR 63 | Ht 67.0 in | Wt 192.1 lb

## 2014-11-17 DIAGNOSIS — I10 Essential (primary) hypertension: Secondary | ICD-10-CM

## 2014-11-17 DIAGNOSIS — R5382 Chronic fatigue, unspecified: Secondary | ICD-10-CM | POA: Diagnosis not present

## 2014-11-17 DIAGNOSIS — E782 Mixed hyperlipidemia: Secondary | ICD-10-CM

## 2014-11-17 DIAGNOSIS — I2510 Atherosclerotic heart disease of native coronary artery without angina pectoris: Secondary | ICD-10-CM

## 2014-11-17 NOTE — Assessment & Plan Note (Signed)
Blood pressure under good control

## 2014-11-17 NOTE — Patient Instructions (Addendum)
Medication Instructions:  Your physician recommends that you continue on your current medications as directed. Please refer to the Current Medication list given to you today.   Labwork:   Testing/Procedures:   Follow-Up:  Your physician wants you to follow-up in: influenza [place] will receive a reminder letter in the mail two months in advance. If you don't receive a letter, please call our office to schedule the follow-up appointment.   Any Other Special Instructions Will Be Listed Below (If Applicable).  CALL OFFICE IF YOU HAVE ANY CARDIAC RELATED ISSUES

## 2014-11-17 NOTE — Progress Notes (Signed)
Cardiology Office Note   Date:  11/17/2014   ID:  Darrell, Mccullough 11-27-1945, MRN [id number]  PCP:  Darrell Richards, MD  Cardiologist: Darrell Champagne, MD  Chief Complaint: fatigue    History of Present Illness: [name] is a 69 y.o. male who presents for follow up. He has a history of CAD, HTN, hyperlipidemia presents for followup. He has had multiple stents in his LAD and RCA. Last cath in 7/12 showed a totally occluded CFX (known from prior caths) with collaterals from the RCA. There was no other significant coronary disease and EF was 55%. He is unable to tolerate statins.   He complains of significant fatigue. He had full work up by primary care including full blood work which was all stable. LDL and triglycerides are still not optimal.He is scheduled for a sleep study at the end of this month.  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 He just joined silver sneakers 2 weeks ago and has noticed a little more energy. He denies chest pain, dyspnea, dyspnea on exertion, dizziness or presyncope. He had an episode of chest soreness several weeks ago that was fleeting and not exertional related.   Past Medical History  Diagnosis Date  . CAD (coronary artery disease)     pt last left heart cath was in Nov 2008. EF was 55% on left ventriculogram. Circumflex was totally occluded after the 1st obtuse marginal with collaterals supplying the distal circumflex. LAD showed luminal irregularities. The stent in LAD was patent. The RCA showed luminal irregularities. The stent in th emid RCA and PDA were patent. There were no inverventions.   . Obesity   . Low back pain     chronic  . GERD (gastroesophageal reflux disease)     hiatal hernia. Patient did hav ea Nissen fundoplication  . Peptic ulcer disease   . DM2 (diabetes mellitus, type 2)     well controlled  . Spinal fracture     hx of traumatic in Jan 2009  after falling off the roof  . Chronic obstructive pulmonary disease     asthma  . Benign prostatic hypertrophy   . Osteoarthritis   . Anxiety     treated /w Xanax for mild depression , using 2 times per day, not PRN  . Depression   . Asthma   . Sleep concern     states the study (2003 )was done at [facility]., it failed & he was told to return & he never did. Pt. states he has been found by prev. hosp. staff that he has to be told when to breathe & his wife does the same.   . Cluster headache     relative to sinus problems   . Cluster headache   . History of blood transfusion     need for Bld. transfusion relative to taking NSAIDS  . Cancer     basal cell- face & head  . 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 HTN (hypertension)     pt. followed by [facility], last cardiac visit 2012, one yr. ago    Past Surgical History  Procedure Laterality Date  . Hiatal hernia repair    . Lumbar spine surgery      x4  . Right temporal artery biopsy    . C5-t6 posterior fusion  with [place] and radius screws  . Right carpal tunnel release    . Esophageal manometry    . Laparoscopic cholecystectomy      & IOC  . Umbilical hernia repair    . Multiple percutaneous coronary interventions    . Back surgery      x3- last surgery lumbar- 1998- / fusion   . Cataracts      cataracts removed- /w IOL - both eyes   . Blepheroplasty      both eyes  . Eye surgery    . Nasal sinus surgery      x2, sees Dr. [name], still having problems, states he uses Benadryl PRN- up to 5 times per day   . Cardiac catheterization      2008 & 2012 multiple stents    . Hardware removal  02/20/2012    Procedure: HARDWARE REMOVAL;  Surgeon: [name], MD;  Location: [facility];  Service: Neurosurgery;  Laterality: N/A;  Hardware Removal     Current Outpatient Prescriptions  Medication Sig Dispense Refill  . Ascorbic Acid (VITAMIN C) 1000 MG  tablet Take 1,000 mg by mouth daily.    Marland Kitchen aspirin EC 81 MG tablet Take 81 mg by mouth daily.    . B Complex Vitamins (B COMPLEX PO) Take by mouth daily.    . bisoprolol-hydrochlorothiazide (ZIAC) 2.5-6.25 MG per tablet TAKE 1 TABLET BY MOUTH DAILY FOR BLOOD PRESSURE AND HEART 90 tablet 2  . Choline Dihydrogen Citrate (CHOLINE CITRATE) 650 MG TABS Take by mouth.    . clobetasol (TEMOVATE) 0.05 % external solution Apply 1 application topically daily. 50 mL 0  . clopidogrel (PLAVIX) 75 MG tablet TAKE 1 TABLET BY MOUTH ONCE DAILY 30 tablet 99  . Coenzyme Q10 (CO Q 10) 100 MG CAPS Take 100 mg by mouth daily.     . fish oil-omega-3 fatty acids 1000 MG capsule Take 2 g by mouth daily.     . Flaxseed, Linseed, (FLAX SEED OIL) 1000 MG CAPS Take 1,000 mg by mouth 2 (two) times daily.     . fluocinonide (LIDEX) 0.05 % external solution     . folic acid (FOLVITE) 161 MCG tablet Take 800 mcg by mouth daily.    Marland Kitchen ipratropium (ATROVENT) 0.03 % nasal spray 1-2 puffs each nostril every 6 hours if needed, before meals 30 mL 12  . Magnesium 500 MG TABS Take 500 mg by mouth 2 (two) times daily.    Marland Kitchen MELATONIN PO Take 30 mg by mouth daily.    . Multiple Vitamin (MULTIVITAMIN) capsule Take 1 capsule by mouth daily.      . naproxen sodium (ANAPROX) 220 MG tablet Take 220 mg by mouth daily as needed.    . niacin 500 MG tablet Take 500 mg by mouth 2 (two) times daily.    . nitroGLYCERIN (NITROSTAT) 0.4 MG SL tablet Place 0.4 mg under the tongue every 5 (five) minutes as needed. For chest pain.    Marland Kitchen OVER THE COUNTER MEDICATION Takes ALA(alpha lipoic)    . Thiamine HCl (B-1 PO) Take 150 mcg by mouth 2 (two) times daily.    . Turmeric 500 MG CAPS Take 500 mg by mouth daily.      No current facility-administered medications for this visit.    Allergies:   Adhesive; Codeine; Crestor; Cymbalta; Dilaudid; Effexor; Morphine and related; Nsaids; Percocet; Sulfa antibiotics; Topamax; Trazodone and nefazodone; and Penicillins     Social History:  The patient  reports that he quit smoking about 36 years ago. He does not have any smokeless tobacco history on file. He reports that he drinks alcohol. He reports that he does not use illicit drugs.   Family History:  The patient's    family history includes Coronary artery disease in his father; Gout in his sister; Heart attack in his brother and brother; Heart failure in his mother.    ROS:  Please see the history of present illness.   Otherwise, review of systems are positive for none.   All other systems are reviewed and negative.    PHYSICAL EXAM: VS:  BP 118/62 mmHg  Pulse 63  Ht '5\' 7"'$  (1.702 m)  Wt 192 lb 1.9 oz (87.145 kg)  BMI 30.08 kg/m2 , BMI Body mass index is 30.08 kg/(m^2). GEN: Well nourished, well developed, in no acute distress Neck: no JVD, HJR, carotid bruits, or masses Cardiac: RRR; no murmurs,gallop, rubs, thrill or heave,  Respiratory:  clear to auscultation bilaterally, normal work of breathing GI: soft, nontender, nondistended, + BS MS: no deformity or atrophy Extremities: without cyanosis, clubbing, edema, good distal pulses bilaterally.  Skin: warm and dry, no rash Neuro:  Strength and sensation are intact    EKG:  EKG is ordered today. The ekg ordered today demonstrates normal sinus rhythm   Recent Labs: 09/02/2014: TSH 1.172 10/23/2014: ALT 20; BUN 16; Creatinine 0.98; Hemoglobin 16.2; Magnesium 1.8; Platelets 162; Potassium 4.0; Sodium 136    Lipid Panel    Component Value Date/Time   CHOL 190 09/02/2014 1155   TRIG 191* 09/02/2014 1155   HDL 41 09/02/2014 1155   CHOLHDL 4.6 09/02/2014 1155   VLDL 38 09/02/2014 1155   LDLCALC 111* 09/02/2014 1155   LDLDIRECT 116.0 06/03/2013 1513      Wt Readings from Last 3 Encounters:  11/17/14 192 lb 1.9 oz (87.145 kg)  10/23/14 191 lb (86.637 kg)  09/02/14 194 lb 9.6 oz (88.27 kg)      Other studies Reviewed: Additional studies/ records that were reviewed today include and  review of the records demonstrates:   FINDINGS: 1. Hemodynamics: LV 118/18, aorta 127/70. 2. Left ventriculography: EF was estimated to be 55%. There were no  regional wall motion abnormalities in the RAO projection. 3. Right coronary artery: The right coronary was a dominant vessel.  There was a proximal-to-mid right coronary artery stent. There was  about 25% in-stent restenosis. There was about 30% distal RCA  stenosis. The PLV and PDA were both moderate-sized vessels. There  was about 40-50% ostial PLV stenosis. The stent in the PDA was  patent. There were luminal irregularities diffusely in the PDA.  The PLV did provide right-to-left collaterals to the distal  circumflex and also supplied 2 small-to-moderate obtuse marginals. 4. Left main: The left main had no angiographic coronary disease. 5. Left circumflex system: The left circumflex was totally occluded  after the first obtuse marginal. This was known from prior heart  catheterizations. The first obtuse marginal was a small-to-  moderate vessel with luminal irregularities. The distal circumflex  was reconstituted via collaterals in the PLV. There were 2 small-  to-moderate obtuse marginals filled via the collaterals. 6. LAD system: There was a proximal LAD stent with 30-40% in-stent  restenosis. The remainder of the LAD had luminal irregularities.  There was a small first diagonal. There was a moderate-sized  second diagonal that had about 40% ostial stenosis.  IMPRESSION: The patient has a known occluded circumflex which was seen  again today. There were good collaterals from the right coronary artery system. The patient's other vessels have no significant disease. His stents were patent. We will plan on medical treatment for now. He will try Prilosec for possible reflux symptoms.   Darrell Champagne, MD DM/MEDQ D: 01/07/2011 T: 01/07/2011 Job:  [id number]    ASSESSMENT AND PLAN: Fatigue Fatigue is the patient's main complaint. He has been sedentary for the past year which has probably contributed to this. Blood work including TSH was normal. He is now able to get into a good exercise program and diet and is already starting to feel better. He would like to continue on this program before undergoing any stress testing or further cardiac workup.   Hyperlipidemia Patient's triglycerides and LDL remain elevated. He does not tolerate statins. He is on niacin. Recommend exercise and weight loss program.   Essential hypertension Blood pressure under good control   Coronary atherosclerosis Patient is stable without angina.      Signed, Ermalinda Barrios, PA-C  11/17/2014 1:43 PM    [facility] [address], [place], [place]  [address] Phone: [phone]; Fax: [phone]

## 2014-11-17 NOTE — Assessment & Plan Note (Signed)
Patient's triglycerides and LDL remain elevated. He does not tolerate statins. He is on niacin. Recommend exercise and weight loss program.

## 2014-11-17 NOTE — Assessment & Plan Note (Signed)
Fatigue is the patient's main complaint. He has been sedentary for the past year which has probably contributed to this. Blood work including TSH was normal. He is now able to get into a good exercise program and diet and is already starting to feel better. He would like to continue on this program before undergoing any stress testing or further cardiac workup.

## 2014-11-17 NOTE — Assessment & Plan Note (Signed)
Patient is stable without angina.

## 2014-12-07 ENCOUNTER — Encounter (HOSPITAL_BASED_OUTPATIENT_CLINIC_OR_DEPARTMENT_OTHER): Payer: Medicare Other

## 2014-12-09 ENCOUNTER — Ambulatory Visit (INDEPENDENT_AMBULATORY_CARE_PROVIDER_SITE_OTHER): Payer: Medicare Other | Admitting: Internal Medicine

## 2014-12-09 ENCOUNTER — Encounter: Payer: Self-pay | Admitting: Internal Medicine

## 2014-12-09 VITALS — BP 134/78 | HR 60 | Temp 97.5°F | Resp 16 | Ht 67.75 in | Wt 190.8 lb

## 2014-12-09 DIAGNOSIS — Z6829 Body mass index (BMI) 29.0-29.9, adult: Secondary | ICD-10-CM | POA: Diagnosis not present

## 2014-12-09 DIAGNOSIS — E1122 Type 2 diabetes mellitus with diabetic chronic kidney disease: Secondary | ICD-10-CM

## 2014-12-09 DIAGNOSIS — Z0000 Encounter for general adult medical examination without abnormal findings: Secondary | ICD-10-CM

## 2014-12-09 DIAGNOSIS — E782 Mixed hyperlipidemia: Secondary | ICD-10-CM | POA: Diagnosis not present

## 2014-12-09 DIAGNOSIS — Z0001 Encounter for general adult medical examination with abnormal findings: Secondary | ICD-10-CM

## 2014-12-09 DIAGNOSIS — E559 Vitamin D deficiency, unspecified: Secondary | ICD-10-CM | POA: Diagnosis not present

## 2014-12-09 DIAGNOSIS — G4730 Sleep apnea, unspecified: Secondary | ICD-10-CM

## 2014-12-09 DIAGNOSIS — Z23 Encounter for immunization: Secondary | ICD-10-CM

## 2014-12-09 DIAGNOSIS — E1129 Type 2 diabetes mellitus with other diabetic kidney complication: Secondary | ICD-10-CM | POA: Diagnosis not present

## 2014-12-09 DIAGNOSIS — Z79899 Other long term (current) drug therapy: Secondary | ICD-10-CM | POA: Diagnosis not present

## 2014-12-09 DIAGNOSIS — N182 Chronic kidney disease, stage 2 (mild): Secondary | ICD-10-CM

## 2014-12-09 DIAGNOSIS — I10 Essential (primary) hypertension: Secondary | ICD-10-CM

## 2014-12-09 DIAGNOSIS — R6889 Other general symptoms and signs: Secondary | ICD-10-CM

## 2014-12-09 DIAGNOSIS — K219 Gastro-esophageal reflux disease without esophagitis: Secondary | ICD-10-CM

## 2014-12-09 DIAGNOSIS — Z1331 Encounter for screening for depression: Secondary | ICD-10-CM

## 2014-12-09 DIAGNOSIS — Z9181 History of falling: Secondary | ICD-10-CM

## 2014-12-09 DIAGNOSIS — G44029 Chronic cluster headache, not intractable: Secondary | ICD-10-CM

## 2014-12-09 DIAGNOSIS — G4710 Hypersomnia, unspecified: Secondary | ICD-10-CM

## 2014-12-09 DIAGNOSIS — I2510 Atherosclerotic heart disease of native coronary artery without angina pectoris: Secondary | ICD-10-CM

## 2014-12-09 LAB — HEPATIC FUNCTION PANEL
ALT: 21 U/L (ref 0–53)
AST: 26 U/L (ref 0–37)
Albumin: 3.9 g/dL (ref 3.5–5.2)
Alkaline Phosphatase: 53 U/L (ref 39–117)
BILIRUBIN DIRECT: 0.1 mg/dL (ref 0.0–0.3)
Indirect Bilirubin: 0.6 mg/dL (ref 0.2–1.2)
TOTAL PROTEIN: 6.8 g/dL (ref 6.0–8.3)
Total Bilirubin: 0.7 mg/dL (ref 0.2–1.2)

## 2014-12-09 LAB — BASIC METABOLIC PANEL WITH GFR
BUN: 19 mg/dL (ref 6–23)
CHLORIDE: 98 meq/L (ref 96–112)
CO2: 25 meq/L (ref 19–32)
CREATININE: 1 mg/dL (ref 0.50–1.35)
Calcium: 9.2 mg/dL (ref 8.4–10.5)
GFR, EST NON AFRICAN AMERICAN: 75 mL/min
GFR, Est African American: 87 mL/min
Glucose, Bld: 81 mg/dL (ref 70–99)
POTASSIUM: 4.1 meq/L (ref 3.5–5.3)
SODIUM: 136 meq/L (ref 135–145)

## 2014-12-09 LAB — CBC WITH DIFFERENTIAL/PLATELET
BASOS ABS: 0.1 10*3/uL (ref 0.0–0.1)
Basophils Relative: 1 % (ref 0–1)
EOS PCT: 2 % (ref 0–5)
Eosinophils Absolute: 0.1 10*3/uL (ref 0.0–0.7)
HCT: 48 % (ref 39.0–52.0)
HEMOGLOBIN: 16.7 g/dL (ref 13.0–17.0)
LYMPHS ABS: 1.7 10*3/uL (ref 0.7–4.0)
Lymphocytes Relative: 30 % (ref 12–46)
MCH: 30.9 pg (ref 26.0–34.0)
MCHC: 34.8 g/dL (ref 30.0–36.0)
MCV: 88.7 fL (ref 78.0–100.0)
MPV: 9.7 fL (ref 8.6–12.4)
Monocytes Absolute: 0.6 10*3/uL (ref 0.1–1.0)
Monocytes Relative: 10 % (ref 3–12)
Neutro Abs: 3.2 10*3/uL (ref 1.7–7.7)
Neutrophils Relative %: 57 % (ref 43–77)
Platelets: 184 10*3/uL (ref 150–400)
RBC: 5.41 MIL/uL (ref 4.22–5.81)
RDW: 14.6 % (ref 11.5–15.5)
WBC: 5.7 10*3/uL (ref 4.0–10.5)

## 2014-12-09 LAB — LIPID PANEL
CHOL/HDL RATIO: 4.8 ratio
CHOLESTEROL: 176 mg/dL (ref 0–200)
HDL: 37 mg/dL — AB (ref >=40)
LDL Cholesterol: 87 mg/dL (ref 0–99)
Triglycerides: 260 mg/dL — ABNORMAL HIGH (ref <150)
VLDL: 52 mg/dL — AB (ref 0–40)

## 2014-12-09 LAB — HEMOGLOBIN A1C
HEMOGLOBIN A1C: 5.6 % (ref <5.7)
Mean Plasma Glucose: 114 mg/dL (ref <117)

## 2014-12-09 LAB — TSH: TSH: 1.459 u[IU]/mL (ref 0.350–4.500)

## 2014-12-09 LAB — MAGNESIUM: Magnesium: 1.7 mg/dL (ref 1.5–2.5)

## 2014-12-09 NOTE — Progress Notes (Signed)
Patient ID: Darrell GRIBBINS, male   DOB: 1946/01/06, 69 y.o.   MRN: [id number]  MEDICARE ANNUAL WELLNESS VISIT AND OV  Assessment:   1. Essential hypertension  - TSH  2. Hyperlipidemia  - Lipid panel  3. CKD stage 2 due to type 2 diabetes mellitus  - Hemoglobin A1c - Insulin, random  4. Vitamin D deficiency  - Vit D  25 hydroxy (rtn osteoporosis monitoring)  5. Gastroesophageal reflux disease, esophagitis presence not specified   6. Atherosclerosis of native coronary artery without angina pectoris, unspecified whether native or transplanted heart   7. Chronic cluster headache, not intractable   8. Morbid obesity   9. Hypersomnia with sleep apnea   10. Depression screen   11. Routine general medical examination at a health care facility   12. Need for prophylactic vaccination with tetanus-diphtheria (TD)  - DT Vaccine greater than 7yo IM  13. Medication management  - CBC with Differential/Platelet - BASIC METABOLIC PANEL WITH GFR - Hepatic function panel - Magnesium  14. At low risk for fall   15. BMI 29.0-29.9,adult  Plan:   During the course of the visit the patient was educated and counseled about appropriate screening and preventive services including:    Pneumococcal vaccine   Influenza vaccine  Td vaccine  Screening electrocardiogram  Bone densitometry screening  Colorectal cancer screening  Diabetes screening  Glaucoma screening  Nutrition counseling   Advanced directives: requested  Screening recommendations, referrals: Vaccinations: Immunization History  Administered Date(s) Administered  . Influenza Split 02/17/2012  . Influenza, High Dose Seasonal PF 03/03/2014  . Pneumococcal Polysaccharide-23 01/19/2012  . Pneumococcal-Unspecified 06/13/1997  . Td 06/13/2004  . Zoster 06/14/2007  Prevnar vaccine Out of Stock Hep B vaccine not indicated  Nutrition assessed and recommended  Colonoscopy 2008 Dr Orland Mustard Recommended yearly ophthalmology/optometry visit for glaucoma screening and checkup Recommended yearly dental visit for hygiene and checkup Advanced directives - yes  Conditions/risks identified: BMI: Discussed weight loss, diet, and increase physical activity.  Increase physical activity: AHA recommends 150 minutes of physical activity a week.  Medications reviewed Diabetes is at goal, ACE/ARB therapy: Not oindicated for diet controlled Prediabetes.  Urinary Incontinence is not an issue: discussed non pharmacology and pharmacology options.  Fall risk: low- discussed PT, home fall assessment, medications.   Subjective:    [name] presents for [employer] Visit and OV.  Date of last medicare wellness visit 11/27/2013.  This very nice 69 y.o. MWM presents for 3 month follow up with Hypertension, Hyperlipidemia, Pre-Diabetes and Vitamin D Deficiency.    Patient is treated for HTN & BP has been controlled at home. Today's BP: 134/78 mmHg. Patient has had no complaints of any cardiac type chest pain, palpitations, dyspnea/orthopnea/PND, dizziness, claudication, or dependent edema.   Hyperlipidemia is not controlled with diet & supplements. Patient denies myalgias or other med SE's. Last Lipids were not at goal -  Cholesterol 190; HDL 41; LDL  111*; Triglycerides 191 on 09/02/2014.   Also, the patient has history of T2_NIDDM controlled with diet and weight loss to borderline prediabetes and has had no symptoms of reactive hypoglycemia, diabetic polys, paresthesias or visual blurring.  Last A1c was 5.7% on 09/02/2014.    Further, the patient also has history of Vitamin D Deficiency of 45 in 2008 and supplements vitamin D without any suspected side-effects. Last vitamin D was 97 on 03/03/2014.  Names of Other Physician/Practitioners you currently use: 1. [facility] here for primary care 2. Dr Marchelle Gearing, eye doctor, last visit  Feb 2016 & f/u scheduled in July 2016 3. Dr Jeanell Sparrow, DDS, dentist, last visit Jan 2016  Patient Care Team: Lucky Cowboy, MD as PCP - General (Internal Medicine) Ihor Gully, MD as Consulting Physician (Urology) Sallye Lat, MD as Consulting Physician (Optometry) Laurey Morale, MD as Consulting Physician (Cardiology) Carman Ching, MD as Consulting Physician (Gastroenterology) Waymon Budge, MD as Consulting Physician (Pulmonary Disease)  Medication Review: Medication Sig  . VITAMIN C 1000 MG tablet Take 1,000 mg by mouth daily.  Marland Kitchen aspirin EC 81 MG t Take 81 mg by mouth daily.  . B COMPLEX  Take by mouth daily.  . bisoprolol-hctz (ZIAC) 2.5-6.25 MG per tablet TAKE 1 TABLET BY MOUTH DAILY FOR BLOOD PRESSURE AND HEART  . CHOLINE CITRATE 650 MG  Take by mouth.  . clobetasol (TEMOVATE) 0.05 % ext soln Apply 1 application topically daily.  . clopidogrel  75 MG tablet TAKE 1 TABLET BY MOUTH ONCE DAILY  . Coenzyme Q10  100 MG CAPS Take 100 mg by mouth daily.   . fish oil 1000 MG capsule Take 2 g by mouth daily.   Marland Kitchen FLAX SEED  1000 MG CAPS Take 1,000 mg by mouth 2 (two) times daily.   . fluocinonide (LIDEX) 0.05 % ext soln   . folic acid (FOLVITE) 800 MCG tablet Take 800 mcg by mouth daily.  Marland Kitchen ipratropium (ATROVENT) 0.03 % nasal spray 1-2 puffs each nostril every 6 hours if needed, before meals  . Magnesium 500 MG TABS Take 500 mg by mouth 2 (two) times daily.  Marland Kitchen MELATONIN  Take 30 mg by mouth daily.  . MULTIVITAMIN Take 1 capsule by mouth daily.    . naproxen (ANAPROX) 220 MG tablet Take 220 mg by mouth daily as needed.  . niacin 500 MG tablet Take 500 mg by mouth 2 (two) times daily.  . nitroGLYCERIN (NITROSTAT) 0.4 MG  Place 0.4 mg under the tongue every 5 (five) minutes as needed. For chest pain.  Marland Kitchen OVER THE COUNTER MEDICATION Takes ALA(alpha lipoic)  . Thiamine HCl (B-1 PO) Take 150 mcg by mouth 2 (two) times daily.  . Turmeric 500 MG CAPS Take 500 mg by mouth daily.     Current Problems (verified) Patient Active Problem List   Diagnosis Date Noted  . Fatigue 10/23/2014  . Hypersomnia with sleep apnea 10/23/2014  . Food allergy 07/01/2014  . Rhinitis, nonallergic, chronic 07/01/2014  . Vitamin D deficiency 07/25/2013  . Medication management 07/25/2013  . CKD stage 2 due to type 2 diabetes mellitus 01/13/2009  . Hyperlipidemia 01/13/2009  . Morbid obesity (BMNI 29.22) 01/13/2009  . Cluster headache syndrome 01/13/2009  . Essential hypertension 01/13/2009  . Coronary atherosclerosis 01/13/2009  . COPD 01/13/2009  . GERD 01/13/2009  . Lumbago, Chronic 01/13/2009  . PUD 01/13/2009  . BPH/Prostatism 01/13/2009   Screening Tests Health Maintenance  Topic Date Due  . OPHTHALMOLOGY EXAM  10/29/1953  . COLONOSCOPY  10/29/1993  . PNA vac Low Risk Adult (2 of 2 - PCV13) 01/18/2013  . TETANUS/TDAP  06/13/2014  . INFLUENZA VACCINE  01/12/2015  . FOOT EXAM  03/04/2015  . URINE MICROALBUMIN  03/04/2015  . HEMOGLOBIN A1C  03/05/2015  . ZOSTAVAX  Completed   Immunization History  Administered Date(s) Administered  . Influenza Split 02/17/2012  . Influenza, High Dose Seasonal PF 03/03/2014  . Pneumococcal Polysaccharide-23 01/19/2012  . Pneumococcal-Unspecified 06/13/1997  .  Td 06/13/2004  . Zoster 06/14/2007   Preventative care: Last colonoscopy: 2008 - due 10 yr f/u  History reviewed: allergies, current medications, past family history, past medical history, past social history, past surgical history and problem list  Risk Factors: Tobacco History  Substance Use Topics  . Smoking status: Former Smoker    Quit date: 02/14/1978  . Smokeless tobacco: Not on file  . Alcohol Use: 0.0 oz/week    0 Standard drinks or equivalent per week     Comment: rare   He does not smoke.  Patient is a former smoker. Are there smokers in your home (other than you)?  No  Alcohol Current alcohol use: none  Caffeine Current caffeine use: coffee 2 cups  /day  Exercise Current exercise: gym 3 x weeek  Nutrition/Diet Current diet: in general, a "healthy" diet    Cardiac risk factors: advanced age (older than 1 for men, 76 for women), diabetes mellitus, dyslipidemia, hypertension, male gender, obesity (BMI >= 30 kg/m2), sedentary lifestyle and smoking/ tobacco exposure.  Depression Screen (Note: if answer to either of the following is "Yes", a more complete depression screening is indicated)   Q1: Over the past two weeks, have you felt down, depressed or hopeless? No  Q2: Over the past two weeks, have you felt little interest or pleasure in doing things? No  Have you lost interest or pleasure in daily life? No  Do you often feel hopeless? No  Do you cry easily over simple problems? No  Activities of Daily Living In your present state of health, do you have any difficulty performing the following activities?:  Driving? No Managing money?  No Feeding yourself? No Getting from bed to chair? No Climbing a flight of stairs? No Preparing food and eating?: No Bathing or showering? No Getting dressed: No Getting to the toilet? No Using the toilet:No Moving around from place to place: No In the past year have you fallen or had a near fall?:No   Are you sexually active?  Yes  Do you have more than one partner?  No  Vision Difficulties: No  Hearing Difficulties: No Do you often ask people to speak up or repeat themselves? No Do you experience ringing or noises in your ears? No Do you have difficulty understanding soft or whispered voices? No  Cognition  Do you feel that you have a problem with memory?No  Do you often misplace items? No  Do you feel safe at home?  Yes  Advanced directives Does patient have a Health Care Power of Attorney? Yes Does patient have a Living Will? Yes  Past Medical History  Diagnosis Date  . CAD (coronary artery disease)     pt last left heart cath was in Nov 2008. EF was 55% on left  ventriculogram. Circumflex was totally occluded after the 1st obtuse marginal with collaterals supplying the distal circumflex. LAD showed luminal irregularities. The stent in LAD was patent. The RCA showed luminal irregularities. The stent in th emid RCA and PDA were patent. There were no inverventions.   . Obesity   . Low back pain     chronic  . GERD (gastroesophageal reflux disease)     hiatal hernia. Patient did hav ea Nissen fundoplication  . Peptic ulcer disease   . DM2 (diabetes mellitus, type 2)     well controlled  . Spinal fracture     hx of traumatic in Jan 2009 after falling off the roof  . Chronic obstructive pulmonary  disease     asthma  . Benign prostatic hypertrophy   . Osteoarthritis   . Anxiety     treated /w Xanax for mild depression , using 2 times per day, not PRN  . Depression   . Asthma   . Sleep concern     states the study (2003 )was done at [facility]., it failed & he was told to return & he never did. Pt. states he has been found by prev. hosp. staff that he has to be told when to breathe & his wife does the same.   . Cluster headache     relative to sinus problems   . Cluster headache   . History of blood transfusion     need for Bld. transfusion relative to taking NSAIDS  . Cancer     basal cell- face & head  . 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 HTN (hypertension)     pt. followed by [facility], last cardiac visit 2012, one yr. ago   Past Surgical History  Procedure Laterality Date  . Hiatal hernia repair    . Lumbar spine surgery      x4  . Right temporal artery biopsy    . C5-t6 posterior fusion      with Oasis and radius screws  . Right carpal tunnel release    . Esophageal manometry    . Laparoscopic cholecystectomy      & IOC  . Umbilical hernia repair    . Multiple percutaneous coronary interventions    . Back surgery      x3- last surgery lumbar- 1998- / fusion    . Cataracts      cataracts removed- /w IOL - both eyes   . Blepheroplasty      both eyes  . Eye surgery    . Nasal sinus surgery      x2, sees Dr. Suszanne Conners, still having problems, states he uses Benadryl PRN- up to 5 times per day   . Cardiac catheterization      2008 & 2012 multiple stents    . Hardware removal  02/20/2012    Procedure: HARDWARE REMOVAL;  Surgeon: Mariam Dollar, MD;  Location: [facility];  Service: Neurosurgery;  Laterality: N/A;  Hardware Removal   ROS: Constitutional: Denies fever, chills, weight loss/gain, headaches, insomnia, fatigue, night sweats or change in appetite. Eyes: Denies redness, blurred vision, diplopia, discharge, itchy or watery eyes.  ENT: Denies discharge, congestion, post nasal drip, epistaxis, sore throat, earache, hearing loss, dental pain, Tinnitus, Vertigo, Sinus pain or snoring.  Cardio: Denies chest pain, palpitations, irregular heartbeat, syncope, dyspnea, diaphoresis, orthopnea, PND, claudication or edema Respiratory: denies cough, dyspnea, DOE, pleurisy, hoarseness, laryngitis or wheezing.  Gastrointestinal: Denies dysphagia, heartburn, reflux, water brash, pain, cramps, nausea, vomiting, bloating, diarrhea, constipation, hematemesis, melena, hematochezia, jaundice or hemorrhoids Genitourinary: Denies dysuria, frequency, urgency, nocturia, hesitancy, discharge, hematuria or flank pain Musculoskeletal: Denies arthralgia, myalgia, stiffness, Jt. Swelling, pain, limp or strain/sprain. Denies Falls. Skin: Denies puritis, rash, hives, warts, acne, eczema or change in skin lesion Neuro: No weakness, tremor, incoordination, spasms, paresthesia or pain Psychiatric: Denies confusion, memory loss or sensory loss. Denies Depression. Endocrine: Denies change in weight, skin, hair change, nocturia, and paresthesia, diabetic polys, visual blurring or hyper / hypo glycemic episodes.  Heme/Lymph: No excessive bleeding, bruising or enlarged lymph  nodes.  Objective:     BP  134/78   Pulse 60  Temp 97.5 F   Resp 16  Ht 5' 7.75"   Wt 190 lb 12.8 oz     BMI 29.22   General Appearance:  Alert  WD/WN, male  in no apparent distress. Eyes: PERRLA, EOMs nl, conjunctiva normal, normal fundi and vessels. Sinuses: No frontal/maxillary tenderness ENT/Mouth: EACs patent / TMs  nl. Nares clear without erythema, swelling, mucoid exudates. Oral hygiene is good. No erythema, swelling, or exudate. Tongue normal, non-obstructing. Tonsils not swollen or erythematous. Hearing normal.  Neck: Supple, thyroid normal. No bruits, nodes or JVD. Respiratory: Respiratory effort normal.  BS equal and clear bilateral without rales, rhonci, wheezing or stridor. Cardio: Heart sounds are normal with regular rate and rhythm and no murmurs, rubs or gallops. Peripheral pulses are normal and equal bilaterally without edema. No aortic or femoral bruits. Chest: symmetric with normal excursions and percussion.  Abdomen: Flat, soft, with nl bowel sounds. Nontender, no guarding, rebound, hernias, masses, or organomegaly.  Lymphatics: Non tender without lymphadenopathy.  Musculoskeletal: Full ROM all peripheral extremities, joint stability, 5/5 strength, and normal gait. Skin: Warm and dry without rashes, lesions, cyanosis, clubbing or  ecchymosis.  Neuro: Cranial nerves intact, reflexes equal bilaterally. Normal muscle tone, no cerebellar symptoms. Sensation intact.  Pysch: Alert and oriented X 3 with normal affect, insight and judgment appropriate.   Cognitive Testing  Alert? Yes  Normal Appearance? Yes  Oriented to person? Yes  Place? Yes   Time? Yes  Recall of three objects?  Yes  Can perform simple calculations? Yes  Displays appropriate judgment? Yes  Can read the correct time from a watch/clock? Yes  Medicare Attestation I have personally reviewed: The patient's medical and social history Their use of alcohol, tobacco or illicit drugs Their current  medications and supplements The patient's functional ability including ADLs,fall risks, home safety risks, cognitive, and hearing and visual impairment Diet and physical activities Evidence for depression or mood disorders  The patient's weight, height, BMI, and visual acuity have been recorded in the chart.  I have made referrals, counseling, and provided education to the patient based on review of the above and I have provided the patient with a written personalized care plan for preventive services.  Over 40 minutes of exam, counseling, chart review was performed.  Darrell Hairston DAVID, MD   12/09/2014

## 2014-12-09 NOTE — Patient Instructions (Signed)
Recommend Adult Low dose Aspirin or baby Aspirin 81 mg daily   To reduce risk of Colon Cancer 20 %,   Skin Cancer 26 % ,   Melanoma 46%   and   Pancreatic cancer 60%  ++++++++++++++++++  Vitamin D goal is between 70-100.   Please make sure that you are taking your Vitamin D as directed.   It is very important as a natural anti-inflammatory   helping hair, skin, and nails, as well as reducing stroke and heart attack risk.   It helps your bones and helps with mood.  It also decreases numerous cancer risks so please take it as directed.   Low Vit D is associated with a 200-300% higher risk for CANCER   and 200-300% higher risk for HEART   ATTACK  &  STROKE.    ......................................  It is also associated with higher death rate at younger ages,   autoimmune diseases like Rheumatoid arthritis, Lupus, Multiple Sclerosis.     Also many other serious conditions, like depression, Alzheimer's  Dementia, infertility, muscle aches, fatigue, fibromyalgia - just to name a few.  +++++++++++++++++++    Recommend the book "The END of DIETING" by Dr Joel Fuhrman   & the book "The END of DIABETES " by Dr Joel Fuhrman  At [employer] - get book & Audio CD's     Being diabetic has a  300% increased risk for heart attack, stroke, cancer, and alzheimer- type vascular dementia. It is very important that you work harder with diet by avoiding all foods that are white. Avoid white rice (brown & wild rice is OK), white potatoes (sweetpotatoes in moderation is OK), White bread or wheat bread or anything made out of white flour like bagels, donuts, rolls, buns, biscuits, cakes, pastries, cookies, pizza crust, and pasta (made from white flour & egg whites) - vegetarian pasta or spinach or wheat pasta is OK. Multigrain breads like Arnold's or Pepper[employer], or multigrain sandwich thins or flatbreads.  Diet, exercise and weight loss can reverse and cure diabetes in the early  stages.  Diet, exercise and weight loss is very important in the control and prevention of complications of diabetes which affects every system in your body, ie. Brain - dementia/stroke, eyes - glaucoma/blindness, heart - heart attack/heart failure, kidneys - dialysis, stomach - gastric paralysis, intestines - malabsorption, nerves - severe painful neuritis, circulation - gangrene & loss of a leg(s), and finally cancer and Alzheimers.    I recommend avoid fried & greasy foods,  sweets/candy, white rice (brown or wild rice or Quinoa is OK), white potatoes (sweet potatoes are OK) - anything made from white flour - bagels, doughnuts, rolls, buns, biscuits,white and wheat breads, pizza crust and traditional pasta made of white flour & egg white(vegetarian pasta or spinach or wheat pasta is OK).  Multi-grain bread is OK - like multi-grain flat bread or sandwich thins. Avoid alcohol in excess. Exercise is also important.    Eat all the vegetables you want - avoid meat, especially red meat and dairy - especially cheese.  Cheese is the most concentrated form of trans-fats which is the worst thing to clog up our arteries. Veggie cheese is OK which can be found in the fresh produce section at [employer] or [employer] or [employer]  ++++++++++++++++++++++++++  Preventive Care for Adults  A healthy lifestyle and preventive care can promote health and wellness. Preventive health guidelines for men include the following key practices:  A routine yearly physical is   a good way to check with your health care provider about your health and preventative screening. It is a chance to share any concerns and updates on your health and to receive a thorough exam.  Visit your dentist for a routine exam and preventative care every 6 months. Brush your teeth twice a day and floss once a day. Good oral hygiene prevents tooth decay and gum disease.  The frequency of eye exams is based on your age, health, family medical  history, use of contact lenses, and other factors. Follow your health care provider's recommendations for frequency of eye exams.  Eat a healthy diet. Foods such as vegetables, fruits, whole grains, low-fat dairy products, and lean protein foods contain the nutrients you need without too many calories. Decrease your intake of foods high in solid fats, added sugars, and salt. Eat the right amount of calories for you.Get information about a proper diet from your health care provider, if necessary.  Regular physical exercise is one of the most important things you can do for your health. Most adults should get at least 150 minutes of moderate-intensity exercise (any activity that increases your heart rate and causes you to sweat) each week. In addition, most adults need muscle-strengthening exercises on 2 or more days a week.  Maintain a healthy weight. The body mass index (BMI) is a screening tool to identify possible weight problems. It provides an estimate of body fat based on height and weight. Your health care provider can find your BMI and can help you achieve or maintain a healthy weight.For adults 20 years and older:  A BMI below 18.5 is considered underweight.  A BMI of 18.5 to 24.9 is normal.  A BMI of 25 to 29.9 is considered overweight.  A BMI of 30 and above is considered obese.  Maintain normal blood lipids and cholesterol levels by exercising and minimizing your intake of saturated fat. Eat a balanced diet with plenty of fruit and vegetables. Blood tests for lipids and cholesterol should begin at age 20 and be repeated every 5 years. If your lipid or cholesterol levels are high, you are over 50, or you are at high risk for heart disease, you may need your cholesterol levels checked more frequently.Ongoing high lipid and cholesterol levels should be treated with medicines if diet and exercise are not working.  If you smoke, find out from your health care provider how to quit. If you  do not use tobacco, do not start.  Lung cancer screening is recommended for adults aged 55-80 years who are at high risk for developing lung cancer because of a history of smoking. A yearly low-dose CT scan of the lungs is recommended for people who have at least a 30-pack-year history of smoking and are a current smoker or have quit within the past 15 years. A pack year of smoking is smoking an average of 1 pack of cigarettes a day for 1 year (for example: 1 pack a day for 30 years or 2 packs a day for 15 years). Yearly screening should continue until the smoker has stopped smoking for at least 15 years. Yearly screening should be stopped for people who develop a health problem that would prevent them from having lung cancer treatment.  If you choose to drink alcohol, do not have more than 2 drinks per day. One drink is considered to be 12 ounces (355 mL) of beer, 5 ounces (148 mL) of wine, or 1.5 ounces (44 mL) of liquor.    Avoid use of street drugs. Do not share needles with anyone. Ask for help if you need support or instructions about stopping the use of drugs.  High blood pressure causes heart disease and increases the risk of stroke. Your blood pressure should be checked at least every 1-2 years. Ongoing high blood pressure should be treated with medicines, if weight loss and exercise are not effective.  If you are 45-79 years old, ask your health care provider if you should take aspirin to prevent heart disease.  Diabetes screening involves taking a blood sample to check your fasting blood sugar level. Testing should be considered at a younger age or be carried out more frequently if you are overweight and have at least 1 risk factor for diabetes.  Colorectal cancer can be detected and often prevented. Most routine colorectal cancer screening begins at the age of 50 and continues through age 75. However, your health care provider may recommend screening at an earlier age if you have risk factors  for colon cancer. On a yearly basis, your health care provider may provide home test kits to check for hidden blood in the stool. Use of a small camera at the end of a tube to directly examine the colon (sigmoidoscopy or colonoscopy) can detect the earliest forms of colorectal cancer. Talk to your health care provider about this at age 50, when routine screening begins. Direct exam of the colon should be repeated every 5-10 years through age 75, unless early forms of precancerous polyps or small growths are found.  Hepatitis C blood testing is recommended for all people born from 1945 through 1965 and any individual with known risks for hepatitis C.  Screening for abdominal aortic aneurysm (AAA)  by ultrasound is recommended for people who have history of high blood pressure or who are current or former smokers.  Healthy men should  receive prostate-specific antigen (PSA) blood tests as part of routine cancer screening. Talk with your health care provider about prostate cancer screening.  Testicular cancer screening is  recommended for adult males. Screening includes self-exam, a health care provider exam, and other screening tests. Consult with your health care provider about any symptoms you have or any concerns you have about testicular cancer.  Use sunscreen. Apply sunscreen liberally and repeatedly throughout the day. You should seek shade when your shadow is shorter than you. Protect yourself by wearing long sleeves, pants, a wide-brimmed hat, and sunglasses year round, whenever you are outdoors.  Once a month, do a whole-body skin exam, using a mirror to look at the skin on your back. Tell your health care provider about new moles, moles that have irregular borders, moles that are larger than a pencil eraser, or moles that have changed in shape or color.  Stay current with required vaccines (immunizations).  Influenza vaccine. All adults should be immunized every year.  Tetanus, diphtheria,  and acellular pertussis (Td, Tdap) vaccine. An adult who has not previously received Tdap or who does not know his vaccine status should receive 1 dose of Tdap. This initial dose should be followed by tetanus and diphtheria toxoids (Td) booster doses every 10 years. Adults with an unknown or incomplete history of completing a 3-dose immunization series with Td-containing vaccines should begin or complete a primary immunization series including a Tdap dose. Adults should receive a Td booster every 10 years.  Zoster vaccine. One dose is recommended for adults aged 60 years or older unless certain conditions are present.    PREVNAR -   Pneumococcal 13-valent conjugate (PCV13) vaccine. When indicated, a person who is uncertain of his immunization history and has no record of immunization should receive the PCV13 vaccine. An adult aged 19 years or older who has certain medical conditions and has not been previously immunized should receive 1 dose of PCV13 vaccine. This PCV13 should be followed with a dose of pneumococcal polysaccharide (PPSV23) vaccine. The PPSV23 vaccine dose should be obtained at least 8 weeks after the dose of PCV13 vaccine. An adult aged 19 years or older who has certain medical conditions and previously received 1 or more doses of PPSV23 vaccine should receive 1 dose of PCV13. The PCV13 vaccine dose should be obtained 1 or more years after the last PPSV23 vaccine dose.    PNEUMOVAX - Pneumococcal polysaccharide (PPSV23) vaccine. When PCV13 is also indicated, PCV13 should be obtained first. All adults aged 65 years and older should be immunized. An adult younger than age 65 years who has certain medical conditions should be immunized. Any person who resides in a nursing home or long-term care facility should be immunized. An adult smoker should be immunized. People with an immunocompromised condition and certain other conditions should receive both PCV13 and PPSV23 vaccines. People with human  immunodeficiency virus (HIV) infection should be immunized as soon as possible after diagnosis. Immunization during chemotherapy or radiation therapy should be avoided. Routine use of PPSV23 vaccine is not recommended for American Indians, [place] Natives, or people younger than 65 years unless there are medical conditions that require PPSV23 vaccine. When indicated, people who have unknown immunization and have no record of immunization should receive PPSV23 vaccine. One-time revaccination 5 years after the first dose of PPSV23 is recommended for people aged 19-64 years who have chronic kidney failure, nephrotic syndrome, asplenia, or immunocompromised conditions. People who received 1-2 doses of PPSV23 before age 65 years should receive another dose of PPSV23 vaccine at age 65 years or later if at least 5 years have passed since the previous dose. Doses of PPSV23 are not needed for people immunized with PPSV23 at or after age 65 years.    Hepatitis A vaccine. Adults who wish to be protected from this disease, have certain high-risk conditions, work with hepatitis A-infected animals, work in hepatitis A research labs, or travel to or work in countries with a high rate of hepatitis A should be immunized. Adults who were previously unvaccinated and who anticipate close contact with an international adoptee during the first 60 days after arrival in the [place] from a country with a high rate of hepatitis A should be immunized.    Hepatitis B vaccine. Adults should be immunized if they wish to be protected from this disease, have certain high-risk conditions, may be exposed to blood or other infectious body fluids, are household contacts or sex partners of hepatitis B positive people, are clients or workers in certain care facilities, or travel to or work in countries with a high rate of hepatitis B.   Preventive Service / Frequency   Ages 65 and over  Blood pressure check.  Lipid and cholesterol  check.  Lung cancer screening. / Every year if you are aged 55-80 years and have a 30-pack-year history of smoking and currently smoke or have quit within the past 15 years. Yearly screening is stopped once you have quit smoking for at least 15 years or develop a health problem that would prevent you from having lung cancer treatment.  Fecal occult blood test (FOBT) of stool. You   may not have to do this test if you get a colonoscopy every 10 years.  Flexible sigmoidoscopy** or colonoscopy.** / Every 5 years for a flexible sigmoidoscopy or every 10 years for a colonoscopy beginning at age 50 and continuing until age 75.  Hepatitis C blood test.** / For all people born from 1945 through 1965 and any individual with known risks for hepatitis C.  Abdominal aortic aneurysm (AAA) screening./ Screening current or former smokers or have Hypertension.  Skin self-exam. / Monthly.  Influenza vaccine. / Every year.  Tetanus, diphtheria, and acellular pertussis (Tdap/Td) vaccine.** / 1 dose of Td every 10 years.   Zoster vaccine.** / 1 dose for adults aged 60 years or older.         Pneumococcal 13-valent conjugate (PCV13) vaccine.    Pneumococcal polysaccharide (PPSV23) vaccine.     Hepatitis A vaccine.** / Consult your health care provider.  Hepatitis B vaccine.** / Consult your health care provider. Screening for abdominal aortic aneurysm (AAA)  by ultrasound is recommended for people who have history of high blood pressure or who are current or former smokers. 

## 2014-12-10 ENCOUNTER — Ambulatory Visit: Payer: Self-pay | Admitting: Internal Medicine

## 2014-12-10 LAB — INSULIN, RANDOM: Insulin: 21 u[IU]/mL — ABNORMAL HIGH (ref 2.0–19.6)

## 2014-12-10 LAB — VITAMIN D 25 HYDROXY (VIT D DEFICIENCY, FRACTURES): Vit D, 25-Hydroxy: 79 ng/mL (ref 30–100)

## 2014-12-23 ENCOUNTER — Ambulatory Visit (HOSPITAL_BASED_OUTPATIENT_CLINIC_OR_DEPARTMENT_OTHER): Payer: Medicare Other | Attending: Internal Medicine | Admitting: Radiology

## 2014-12-23 VITALS — Ht 67.0 in | Wt 184.0 lb

## 2014-12-23 DIAGNOSIS — G4733 Obstructive sleep apnea (adult) (pediatric): Secondary | ICD-10-CM | POA: Diagnosis not present

## 2014-12-23 DIAGNOSIS — R0683 Snoring: Secondary | ICD-10-CM | POA: Diagnosis not present

## 2014-12-23 DIAGNOSIS — G4710 Hypersomnia, unspecified: Secondary | ICD-10-CM

## 2014-12-23 DIAGNOSIS — I493 Ventricular premature depolarization: Secondary | ICD-10-CM | POA: Insufficient documentation

## 2014-12-23 DIAGNOSIS — G4730 Sleep apnea, unspecified: Secondary | ICD-10-CM

## 2014-12-25 ENCOUNTER — Ambulatory Visit (HOSPITAL_BASED_OUTPATIENT_CLINIC_OR_DEPARTMENT_OTHER): Payer: Medicare Other | Admitting: Internal Medicine

## 2014-12-25 ENCOUNTER — Ambulatory Visit (INDEPENDENT_AMBULATORY_CARE_PROVIDER_SITE_OTHER): Payer: Medicare Other | Admitting: *Deleted

## 2014-12-25 DIAGNOSIS — G4710 Hypersomnia, unspecified: Secondary | ICD-10-CM | POA: Diagnosis not present

## 2014-12-25 DIAGNOSIS — G4730 Sleep apnea, unspecified: Secondary | ICD-10-CM | POA: Diagnosis not present

## 2014-12-25 DIAGNOSIS — Z23 Encounter for immunization: Secondary | ICD-10-CM | POA: Diagnosis not present

## 2014-12-25 NOTE — Progress Notes (Signed)
   Patient Name: [name], [name] Date: 12/23/2014 Gender: Male D.O.B: 1945-12-02 Age (years): 57 Referring Provider: [name] Height (inches): 30 Interpreting Physician: Baird Lyons MD, ABSM Weight (lbs): 184 RPSGT: Zadie Rhine BMI: 29 MRN: [id number] Neck Size: 16.50 CLINICAL INFORMATION Sleep Study Type: NPSG  Indication for sleep study: Snoring  Epworth Sleepiness Score: 7  SLEEP STUDY TECHNIQUE As per the AASM Manual for the Scoring of Sleep and Associated Events v2.3 (April 2016) with a hypopnea requiring 4% desaturations.  The channels recorded and monitored were frontal, central and occipital EEG, electrooculogram (EOG), submentalis EMG (chin), nasal and oral airflow, thoracic and abdominal wall motion, anterior tibialis EMG, snore microphone, electrocardiogram, and pulse oximetry.  MEDICATIONS Patient's medications include: Charted for review. Medications self-administered by patient during sleep study : No sleep medicine administered.  SLEEP ARCHITECTURE The study was initiated at 10:40:15 PM and ended at 5:20:22 AM.  Sleep onset time was 5.9 minutes and the sleep efficiency was 86.5%. The total sleep time was 346.0 minutes.  Stage REM latency was 57.0 minutes.  The patient spent 2.46% of the night in stage N1 sleep, 69.65% in stage N2 sleep, 0.43% in stage N3 and 27.46% in REM.  Alpha intrusion was absent.  Supine sleep was 32.51%.  Awake after sleep onset 46.2 minutes  RESPIRATORY PARAMETERS The overall apnea/hypopnea index (AHI) was 8.5 per hour. There were 28 total apneas, including 25 obstructive, 3 central and 0 mixed apneas. There were 21 hypopneas and 59 RERAs.  The AHI during Stage REM sleep was 1.3 per hour.  AHI while supine was 24.5 per hour.  The mean oxygen saturation was 93.52%. The minimum SpO2 during sleep was 84.00%.  Moderate snoring was noted during this study.  CARDIAC DATA The 2 lead EKG demonstrated sinus  rhythm. The mean heart rate was 63.94 beats per minute. Other EKG findings include: PVCs.  LEG MOVEMENT DATA The total PLMS were 0 with a resulting PLMS index of 0.00. Associated arousal with leg movement index was 0.0 .  IMPRESSIONS Mild obstructive sleep apnea occurred during this study (AHI = 8.5/h). No significant central sleep apnea occurred during this study (CAI = 0.5/h). Mild oxygen desaturation was noted during this study (Min O2 = 84.00%). The patient snored with Moderate snoring volume. EKG findings include PVCs. Clinically significant periodic limb movements did not occur during sleep. No significant associated arousals.  DIAGNOSIS Obstructive Sleep Apnea (327.23 [G47.33 ICD-10])  RECOMMENDATIONS Conservative measures may be sufficient. On an individual basis, other options might include CPAP or an oral appliance based on clinical judgment. Positional therapy avoiding supine position during sleep. Avoid alcohol, sedatives and other CNS depressants that may worsen sleep apnea and disrupt normal sleep architecture. Sleep hygiene should be reviewed to assess factors that may improve sleep quality. Weight management and regular exercise should be initiated or continued if appropriate.   Deneise Lever Diplomate, American Board of Sleep Medicine  ELECTRONICALLY SIGNED ON:  12/25/2014, 3:51 PM [place] PH: [phone]   FX: [phone] [place]

## 2015-01-05 ENCOUNTER — Encounter: Payer: Self-pay | Admitting: Internal Medicine

## 2015-01-22 DIAGNOSIS — Z961 Presence of intraocular lens: Secondary | ICD-10-CM | POA: Diagnosis not present

## 2015-01-22 DIAGNOSIS — H26493 Other secondary cataract, bilateral: Secondary | ICD-10-CM | POA: Diagnosis not present

## 2015-01-22 DIAGNOSIS — E119 Type 2 diabetes mellitus without complications: Secondary | ICD-10-CM | POA: Diagnosis not present

## 2015-01-29 DIAGNOSIS — Z6836 Body mass index (BMI) 36.0-36.9, adult: Secondary | ICD-10-CM | POA: Diagnosis not present

## 2015-01-29 DIAGNOSIS — M5137 Other intervertebral disc degeneration, lumbosacral region: Secondary | ICD-10-CM | POA: Diagnosis not present

## 2015-01-29 DIAGNOSIS — I10 Essential (primary) hypertension: Secondary | ICD-10-CM | POA: Diagnosis not present

## 2015-02-02 ENCOUNTER — Ambulatory Visit
Admission: RE | Admit: 2015-02-02 | Discharge: 2015-02-02 | Disposition: A | Discharge status: Home / Self Care | Payer: Medicare Other | Source: Ambulatory Visit | Attending: Neurosurgery | Admitting: Neurosurgery

## 2015-02-02 ENCOUNTER — Other Ambulatory Visit: Payer: Self-pay | Admitting: Neurosurgery

## 2015-02-02 DIAGNOSIS — M5137 Other intervertebral disc degeneration, lumbosacral region: Secondary | ICD-10-CM

## 2015-02-02 DIAGNOSIS — M4316 Spondylolisthesis, lumbar region: Secondary | ICD-10-CM | POA: Diagnosis not present

## 2015-02-02 DIAGNOSIS — M9973 Connective tissue and disc stenosis of intervertebral foramina of lumbar region: Secondary | ICD-10-CM | POA: Diagnosis not present

## 2015-02-02 DIAGNOSIS — M5136 Other intervertebral disc degeneration, lumbar region: Secondary | ICD-10-CM | POA: Diagnosis not present

## 2015-02-02 DIAGNOSIS — M4806 Spinal stenosis, lumbar region: Secondary | ICD-10-CM | POA: Diagnosis not present

## 2015-02-03 DIAGNOSIS — Z6836 Body mass index (BMI) 36.0-36.9, adult: Secondary | ICD-10-CM | POA: Diagnosis not present

## 2015-02-03 DIAGNOSIS — M5137 Other intervertebral disc degeneration, lumbosacral region: Secondary | ICD-10-CM | POA: Diagnosis not present

## 2015-02-03 DIAGNOSIS — M542 Cervicalgia: Secondary | ICD-10-CM | POA: Diagnosis not present

## 2015-02-09 ENCOUNTER — Encounter: Payer: Self-pay | Admitting: Internal Medicine

## 2015-02-11 ENCOUNTER — Encounter: Payer: Self-pay | Admitting: Physician Assistant

## 2015-02-11 ENCOUNTER — Ambulatory Visit (INDEPENDENT_AMBULATORY_CARE_PROVIDER_SITE_OTHER): Payer: Medicare Other | Admitting: Physician Assistant

## 2015-02-11 VITALS — BP 136/88 | HR 64 | Temp 97.8°F | Resp 16 | Ht 67.75 in | Wt 190.6 lb

## 2015-02-11 DIAGNOSIS — I2510 Atherosclerotic heart disease of native coronary artery without angina pectoris: Secondary | ICD-10-CM | POA: Diagnosis not present

## 2015-02-11 DIAGNOSIS — J358 Other chronic diseases of tonsils and adenoids: Secondary | ICD-10-CM

## 2015-02-11 NOTE — Progress Notes (Signed)
Subjective:    Patient ID: [name], male    DOB: 05-31-1946, 69 y.o.   MRN: [id number]  HPI 69 y.o. remote history of smoking (Quit in 1979) presents with "pimple" or white thing on right tonsil, has scratched it off 2-3 times but comes back. He states he does have history of tonsil stones, has been off his allergy pills for several days, no pain with swallowing but he does feel that there is something there. Denies fever, chills, no weight loss, night sweats.   Wt Readings from Last 3 Encounters:  02/11/15 190 lb 9.6 oz (86.456 kg)  12/23/14 184 lb (83.462 kg)  12/09/14 190 lb 12.8 oz (86.546 kg)   Blood pressure 136/88, pulse 64, temperature 97.8 F (36.6 C), resp. rate 16, height 5' 7.75" (1.721 m), weight 190 lb 9.6 oz (86.456 kg).  Past Medical History  Diagnosis Date  . CAD (coronary artery disease)     pt last left heart cath was in Nov 2008. EF was 55% on left ventriculogram. Circumflex was totally occluded after the 1st obtuse marginal with collaterals supplying the distal circumflex. LAD showed luminal irregularities. The stent in LAD was patent. The RCA showed luminal irregularities. The stent in th emid RCA and PDA were patent. There were no inverventions.   . Obesity   . Low back pain     chronic  . GERD (gastroesophageal reflux disease)     hiatal hernia. Patient did hav ea Nissen fundoplication  . Peptic ulcer disease   . DM2 (diabetes mellitus, type 2)     well controlled  . Spinal fracture     hx of traumatic in Jan 2009 after falling off the roof  . Chronic obstructive pulmonary disease     asthma  . Benign prostatic hypertrophy   . Osteoarthritis   . Anxiety     treated /w Xanax for mild depression , using 2 times per day, not PRN  . Depression   . Asthma   . Sleep concern     states the study (2003 )was done at [facility] it failed & he was told to return & he never did. Pt. states he has been found by prev. hosp. staff that he has to be told  when to breathe & his wife does the same.   . Cluster headache     relative to sinus problems   . Cluster headache   . History of blood transfusion     need for Bld. transfusion relative to taking NSAIDS  . Cancer     basal cell- face & head  . 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 HTN (hypertension)     pt. followed by [facility], last cardiac visit 2012, one yr. ago    Current Outpatient Prescriptions on File Prior to Visit  Medication Sig Dispense Refill  . Ascorbic Acid (VITAMIN C) 1000 MG tablet Take 1,000 mg by mouth daily.    Marland Kitchen aspirin EC 81 MG tablet Take 81 mg by mouth daily.    . B Complex Vitamins (B COMPLEX PO) Take by mouth daily.    . bisoprolol-hydrochlorothiazide (ZIAC) 2.5-6.25 MG per tablet TAKE 1 TABLET BY MOUTH DAILY FOR BLOOD PRESSURE AND HEART 90 tablet 2  . Choline Dihydrogen Citrate (CHOLINE CITRATE) 650 MG TABS Take by mouth.    . clobetasol (TEMOVATE) 0.05 % external solution Apply 1 application topically  daily. 50 mL 0  . clopidogrel (PLAVIX) 75 MG tablet TAKE 1 TABLET BY MOUTH ONCE DAILY 30 tablet 99  . Coenzyme Q10 (CO Q 10) 100 MG CAPS Take 100 mg by mouth daily.     . fish oil-omega-3 fatty acids 1000 MG capsule Take 2 g by mouth daily.     . Flaxseed, Linseed, (FLAX SEED OIL) 1000 MG CAPS Take 1,000 mg by mouth 2 (two) times daily.     . fluocinonide (LIDEX) 0.05 % external solution     . folic acid (FOLVITE) 458 MCG tablet Take 800 mcg by mouth daily.    Marland Kitchen ipratropium (ATROVENT) 0.03 % nasal spray 1-2 puffs each nostril every 6 hours if needed, before meals 30 mL 12  . Magnesium 500 MG TABS Take 500 mg by mouth 2 (two) times daily.    Marland Kitchen MELATONIN PO Take 30 mg by mouth daily.    . Multiple Vitamin (MULTIVITAMIN) capsule Take 1 capsule by mouth daily.      . naproxen sodium (ANAPROX) 220 MG tablet Take 220 mg by mouth daily as needed.    . niacin 500 MG tablet Take 500 mg by mouth 2  (two) times daily.    . nitroGLYCERIN (NITROSTAT) 0.4 MG SL tablet Place 0.4 mg under the tongue every 5 (five) minutes as needed. For chest pain.    Marland Kitchen OVER THE COUNTER MEDICATION Takes ALA(alpha lipoic)    . Turmeric 500 MG CAPS Take 500 mg by mouth daily.      No current facility-administered medications on file prior to visit.    Review of Systems     Objective:   Physical Exam  Constitutional: He appears well-developed and well-nourished.  HENT:  Nose: Nose normal.  Mouth/Throat: Uvula is midline, oropharynx is clear and moist and mucous membranes are normal. No oropharyngeal exudate, posterior oropharyngeal edema, posterior oropharyngeal erythema or tonsillar abscesses.    Neck: Normal range of motion. Neck supple.  Cardiovascular: Normal rate and regular rhythm.   Pulmonary/Chest: Effort normal and breath sounds normal.  Lymphadenopathy:       Head (right side): No submental, no submandibular, no tonsillar, no preauricular, no posterior auricular and no occipital adenopathy present.       Head (left side): No submental, no submandibular, no tonsillar, no preauricular, no posterior auricular and no occipital adenopathy present.    He has no cervical adenopathy.         Assessment & Plan:  Right posterior pharynx with mass Will refer to ENT for removal per patient request Continue gargling warm water with peroxide

## 2015-02-12 DIAGNOSIS — J3501 Chronic tonsillitis: Secondary | ICD-10-CM | POA: Diagnosis not present

## 2015-02-24 ENCOUNTER — Ambulatory Visit (INDEPENDENT_AMBULATORY_CARE_PROVIDER_SITE_OTHER): Payer: Medicare Other | Admitting: Physician Assistant

## 2015-02-24 ENCOUNTER — Encounter: Payer: Self-pay | Admitting: Internal Medicine

## 2015-02-24 VITALS — BP 140/70 | HR 66 | Temp 97.3°F | Resp 16 | Wt 189.2 lb

## 2015-02-24 DIAGNOSIS — M4807 Spinal stenosis, lumbosacral region: Secondary | ICD-10-CM | POA: Diagnosis not present

## 2015-02-24 DIAGNOSIS — I2510 Atherosclerotic heart disease of native coronary artery without angina pectoris: Secondary | ICD-10-CM | POA: Diagnosis not present

## 2015-02-24 DIAGNOSIS — M5136 Other intervertebral disc degeneration, lumbar region: Secondary | ICD-10-CM | POA: Diagnosis not present

## 2015-02-24 DIAGNOSIS — G4710 Hypersomnia, unspecified: Secondary | ICD-10-CM | POA: Diagnosis not present

## 2015-02-24 DIAGNOSIS — G4730 Sleep apnea, unspecified: Secondary | ICD-10-CM

## 2015-02-24 NOTE — Progress Notes (Signed)
Assessment and Plan: OSA- mild sleep apnea, AHI 8,5, with symptoms suggest getting on CPAP trial to see if it helps with fatigue/memory.   Future Appointments Date Time Provider [place]  03/05/2015 9:45 AM Vicie Mutters, PA-C [facility] None  03/11/2015 9:00 AM [name], MD [facility] None    HPI 69 y.o.male presents for discussion of his sleep study test. He had a sleep study in July with Dr. Annamaria Boots that showed mild sleep apnea, AHI 8.5 worse in supine. He states that he has had decreased sleep for several months, will have fatigue in the AM/day, nocturia 1-2 times during the night with difficulty getting back to sleep, worsening memory issues/concentrations but has been under stress as well.   Past Medical History  Diagnosis Date  . CAD (coronary artery disease)     pt last left heart cath was in Nov 2008. EF was 55% on left ventriculogram. Circumflex was totally occluded after the 1st obtuse marginal with collaterals supplying the distal circumflex. LAD showed luminal irregularities. The stent in LAD was patent. The RCA showed luminal irregularities. The stent in th emid RCA and PDA were patent. There were no inverventions.   . Obesity   . Low back pain     chronic  . GERD (gastroesophageal reflux disease)     hiatal hernia. Patient did hav ea Nissen fundoplication  . Peptic ulcer disease   . DM2 (diabetes mellitus, type 2)     well controlled  . Spinal fracture     hx of traumatic in Jan 2009 after falling off the roof  . Chronic obstructive pulmonary disease     asthma  . Benign prostatic hypertrophy   . Osteoarthritis   . Anxiety     treated /w Xanax for mild depression , using 2 times per day, not PRN  . Depression   . Asthma   . Sleep concern     states the study (2003 )was done at [facility]., it failed & he was told to return & he never did. Pt. states he has been found by prev. hosp. staff that he has to be told when to breathe & his wife does the same.    . Cluster headache     relative to sinus problems   . Cluster headache   . History of blood transfusion     need for Bld. transfusion relative to taking NSAIDS  . Cancer     basal cell- face & head  . 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 HTN (hypertension)     pt. followed by [facility], last cardiac visit 2012, one yr. ago     Allergies  Allergen Reactions  . Adhesive [Tape]     SKIN PEELS  . Codeine Itching    Also hallucinations   . Crestor [Rosuvastatin]   . Cymbalta [Duloxetine Hcl]   . Dilaudid [Hydromorphone Hcl]   . Effexor [Venlafaxine]   . Morphine And Related Other (See Comments)    Hallucinations  . Nsaids Other (See Comments)    BLEEDING  . Percocet [Oxycodone-Acetaminophen]   . Sulfa Antibiotics   . Topamax [Topiramate]   . Trazodone And Nefazodone   . Penicillins Rash      Current Outpatient Prescriptions on File Prior to Visit  Medication Sig Dispense Refill  . Ascorbic Acid (VITAMIN C) 1000 MG tablet Take 1,000 mg by mouth daily.    Marland Kitchen  aspirin EC 81 MG tablet Take 81 mg by mouth daily.    . B Complex Vitamins (B COMPLEX PO) Take by mouth daily.    . bisoprolol-hydrochlorothiazide (ZIAC) 2.5-6.25 MG per tablet TAKE 1 TABLET BY MOUTH DAILY FOR BLOOD PRESSURE AND HEART 90 tablet 2  . Choline Dihydrogen Citrate (CHOLINE CITRATE) 650 MG TABS Take by mouth.    . clobetasol (TEMOVATE) 0.05 % external solution Apply 1 application topically daily. 50 mL 0  . clopidogrel (PLAVIX) 75 MG tablet TAKE 1 TABLET BY MOUTH ONCE DAILY 30 tablet 99  . Coenzyme Q10 (CO Q 10) 100 MG CAPS Take 100 mg by mouth daily.     . fish oil-omega-3 fatty acids 1000 MG capsule Take 2 g by mouth daily.     . Flaxseed, Linseed, (FLAX SEED OIL) 1000 MG CAPS Take 1,000 mg by mouth 2 (two) times daily.     . fluocinonide (LIDEX) 0.05 % external solution     . folic acid (FOLVITE) 778 MCG tablet Take 800 mcg by mouth daily.     Marland Kitchen ipratropium (ATROVENT) 0.03 % nasal spray 1-2 puffs each nostril every 6 hours if needed, before meals 30 mL 12  . Magnesium 500 MG TABS Take 500 mg by mouth 2 (two) times daily.    Marland Kitchen MELATONIN PO Take 30 mg by mouth daily.    . Multiple Vitamin (MULTIVITAMIN) capsule Take 1 capsule by mouth daily.      . naproxen sodium (ANAPROX) 220 MG tablet Take 220 mg by mouth daily as needed.    . niacin 500 MG tablet Take 500 mg by mouth 2 (two) times daily.    . nitroGLYCERIN (NITROSTAT) 0.4 MG SL tablet Place 0.4 mg under the tongue every 5 (five) minutes as needed. For chest pain.    Marland Kitchen OVER THE COUNTER MEDICATION Takes ALA(alpha lipoic)    . Turmeric 500 MG CAPS Take 500 mg by mouth daily.      No current facility-administered medications on file prior to visit.    ROS: all negative except above.   Physical Exam: Filed Weights   02/24/15 1223  Weight: 189 lb 3.2 oz (85.821 kg)   BP 140/70 mmHg  Pulse 66  Temp(Src) 97.3 F (36.3 C)  Resp 16  Wt 189 lb 3.2 oz (85.821 kg) General Appearance: Well nourished, in no apparent distress. Eyes: PERRLA, EOMs, conjunctiva no swelling or erythema Sinuses: No Frontal/maxillary tenderness ENT/Mouth: Ext aud canals clear, TMs without erythema, bulging. No erythema, swelling, or exudate on post pharynx.  Tonsils not swollen or erythematous. Hearing normal.  Neck: Supple, thyroid normal.  Respiratory: Respiratory effort normal, BS equal bilaterally without rales, rhonchi, wheezing or stridor.  Cardio: RRR with no MRGs. Brisk peripheral pulses without edema.  Abdomen: Soft, + BS, obese, Non tender, no guarding, rebound, hernias, masses. Lymphatics: Non tender without lymphadenopathy.  Musculoskeletal: Full ROM, 5/5 strength, normal gait.  Skin: Warm, dry without rashes, lesions, ecchymosis.  Neuro: Cranial nerves intact. Normal muscle tone, no cerebellar symptoms. Sensation intact.  Psych: Awake and oriented X 3, normal affect, Insight and Judgment  appropriate.     Vicie Mutters, PA-C 12:30 PM [facility]

## 2015-02-24 NOTE — Patient Instructions (Signed)
I think it is possible that you have sleep apnea. It can cause interrupted sleep, headaches, frequent awakenings, fatigue, dry mouth, fast/slow heart beats, memory issues, anxiety/depression, swelling, numbness tingling hands/feet, weight gain, shortness of breath, and the list goes on. Sleep apnea needs to be ruled out because if it is left untreated it does eventually lead to abnormal heart beats, lung failure or heart failure as well as increasing the risk of heart attack and stroke. There are masks you can wear OR a mouth piece that I can give you information about. Often times though people feel MUCH better after getting treatment.   Sleep Apnea  Sleep apnea is a sleep disorder characterized by abnormal pauses in breathing while you sleep. When your breathing pauses, the level of oxygen in your blood decreases. This causes you to move out of deep sleep and into light sleep. As a result, your quality of sleep is poor, and the system that carries your blood throughout your body (cardiovascular system) experiences stress. If sleep apnea remains untreated, the following conditions can develop:  High blood pressure (hypertension).  Coronary artery disease.  Inability to achieve or maintain an erection (impotence).  Impairment of your thought process (cognitive dysfunction). There are three types of sleep apnea: 1. Obstructive sleep apnea--Pauses in breathing during sleep because of a blocked airway. 2. Central sleep apnea--Pauses in breathing during sleep because the area of the brain that controls your breathing does not send the correct signals to the muscles that control breathing. 3. Mixed sleep apnea--A combination of both obstructive and central sleep apnea.  RISK FACTORS The following risk factors can increase your risk of developing sleep apnea:  Being overweight.  Smoking.  Having narrow passages in your nose and throat.  Being of older age.  Being male.  Alcohol use.   Sedative and tranquilizer use.  Ethnicity. Among individuals younger than 35 years, African Americans are at increased risk of sleep apnea. SYMPTOMS   Difficulty staying asleep.  Daytime sleepiness and fatigue.  Loss of energy.  Irritability.  Loud, heavy snoring.  Morning headaches.  Trouble concentrating.  Forgetfulness.  Decreased interest in sex. DIAGNOSIS  In order to diagnose sleep apnea, your caregiver will perform a physical examination. Your caregiver may suggest that you take a home sleep test. Your caregiver may also recommend that you spend the night in a sleep lab. In the sleep lab, several monitors record information about your heart, lungs, and brain while you sleep. Your leg and arm movements and blood oxygen level are also recorded. TREATMENT The following actions may help to resolve mild sleep apnea:  Sleeping on your side.   Using a decongestant if you have nasal congestion.   Avoiding the use of depressants, including alcohol, sedatives, and narcotics.   Losing weight and modifying your diet if you are overweight. There also are devices and treatments to help open your airway:  Oral appliances. These are custom-made mouthpieces that shift your lower jaw forward and slightly open your bite. This opens your airway.  Devices that create positive airway pressure. This positive pressure "splints" your airway open to help you breathe better during sleep. The following devices create positive airway pressure:  Continuous positive airway pressure (CPAP) device. The CPAP device creates a continuous level of air pressure with an air pump. The air is delivered to your airway through a mask while you sleep. This continuous pressure keeps your airway open.  Nasal expiratory positive airway pressure (EPAP) device. The EPAP device   creates positive air pressure as you exhale. The device consists of single-use valves, which are inserted into each nostril and held in  place by adhesive. The valves create very little resistance when you inhale but create much more resistance when you exhale. That increased resistance creates the positive airway pressure. This positive pressure while you exhale keeps your airway open, making it easier to breath when you inhale again.  Bilevel positive airway pressure (BPAP) device. The BPAP device is used mainly in patients with central sleep apnea. This device is similar to the CPAP device because it also uses an air pump to deliver continuous air pressure through a mask. However, with the BPAP machine, the pressure is set at two different levels. The pressure when you exhale is lower than the pressure when you inhale.  Surgery. Typically, surgery is only done if you cannot comply with less invasive treatments or if the less invasive treatments do not improve your condition. Surgery involves removing excess tissue in your airway to create a wider passage way. Document Released: 05/20/2002 Document Revised: 09/24/2012 Document Reviewed: 10/06/2011 Exit[employer] Patient Information 2015 [employer]. This information is not intended to replace advice given to you by your health care provider. Make sure you discuss any questions you have with your health care provider.     

## 2015-02-25 DIAGNOSIS — J449 Chronic obstructive pulmonary disease, unspecified: Secondary | ICD-10-CM

## 2015-02-25 DIAGNOSIS — G4733 Obstructive sleep apnea (adult) (pediatric): Secondary | ICD-10-CM

## 2015-02-25 HISTORY — DX: Chronic obstructive pulmonary disease, unspecified: J44.9

## 2015-02-25 HISTORY — DX: Chronic obstructive pulmonary disease, unspecified: G47.33

## 2015-03-05 ENCOUNTER — Ambulatory Visit: Payer: Self-pay | Admitting: Physician Assistant

## 2015-03-09 DIAGNOSIS — Z6836 Body mass index (BMI) 36.0-36.9, adult: Secondary | ICD-10-CM | POA: Diagnosis not present

## 2015-03-09 DIAGNOSIS — E119 Type 2 diabetes mellitus without complications: Secondary | ICD-10-CM | POA: Diagnosis not present

## 2015-03-09 DIAGNOSIS — M4807 Spinal stenosis, lumbosacral region: Secondary | ICD-10-CM | POA: Diagnosis not present

## 2015-03-09 DIAGNOSIS — M5136 Other intervertebral disc degeneration, lumbar region: Secondary | ICD-10-CM | POA: Diagnosis not present

## 2015-03-09 DIAGNOSIS — I2510 Atherosclerotic heart disease of native coronary artery without angina pectoris: Secondary | ICD-10-CM | POA: Diagnosis not present

## 2015-03-10 DIAGNOSIS — M4807 Spinal stenosis, lumbosacral region: Secondary | ICD-10-CM | POA: Diagnosis not present

## 2015-03-10 DIAGNOSIS — I10 Essential (primary) hypertension: Secondary | ICD-10-CM | POA: Diagnosis not present

## 2015-03-10 DIAGNOSIS — Z6836 Body mass index (BMI) 36.0-36.9, adult: Secondary | ICD-10-CM | POA: Diagnosis not present

## 2015-03-11 ENCOUNTER — Ambulatory Visit (INDEPENDENT_AMBULATORY_CARE_PROVIDER_SITE_OTHER): Payer: Medicare Other | Admitting: Internal Medicine

## 2015-03-11 ENCOUNTER — Encounter: Payer: Self-pay | Admitting: Internal Medicine

## 2015-03-11 ENCOUNTER — Other Ambulatory Visit: Payer: Self-pay | Admitting: Internal Medicine

## 2015-03-11 VITALS — BP 146/72 | HR 64 | Temp 98.1°F | Resp 16 | Ht 67.5 in | Wt 191.4 lb

## 2015-03-11 DIAGNOSIS — Z91018 Allergy to other foods: Secondary | ICD-10-CM | POA: Diagnosis not present

## 2015-03-11 DIAGNOSIS — Z79899 Other long term (current) drug therapy: Secondary | ICD-10-CM | POA: Diagnosis not present

## 2015-03-11 DIAGNOSIS — E1122 Type 2 diabetes mellitus with diabetic chronic kidney disease: Secondary | ICD-10-CM

## 2015-03-11 DIAGNOSIS — N182 Chronic kidney disease, stage 2 (mild): Secondary | ICD-10-CM | POA: Diagnosis not present

## 2015-03-11 DIAGNOSIS — Z1389 Encounter for screening for other disorder: Secondary | ICD-10-CM

## 2015-03-11 DIAGNOSIS — Z6829 Body mass index (BMI) 29.0-29.9, adult: Secondary | ICD-10-CM

## 2015-03-11 DIAGNOSIS — E559 Vitamin D deficiency, unspecified: Secondary | ICD-10-CM | POA: Diagnosis not present

## 2015-03-11 DIAGNOSIS — N320 Bladder-neck obstruction: Secondary | ICD-10-CM

## 2015-03-11 DIAGNOSIS — I10 Essential (primary) hypertension: Secondary | ICD-10-CM

## 2015-03-11 DIAGNOSIS — E782 Mixed hyperlipidemia: Secondary | ICD-10-CM

## 2015-03-11 DIAGNOSIS — Z9181 History of falling: Secondary | ICD-10-CM

## 2015-03-11 DIAGNOSIS — Z1212 Encounter for screening for malignant neoplasm of rectum: Secondary | ICD-10-CM | POA: Diagnosis not present

## 2015-03-11 DIAGNOSIS — Z1331 Encounter for screening for depression: Secondary | ICD-10-CM

## 2015-03-11 DIAGNOSIS — E1129 Type 2 diabetes mellitus with other diabetic kidney complication: Secondary | ICD-10-CM | POA: Diagnosis not present

## 2015-03-11 DIAGNOSIS — Z125 Encounter for screening for malignant neoplasm of prostate: Secondary | ICD-10-CM

## 2015-03-11 DIAGNOSIS — Z789 Other specified health status: Secondary | ICD-10-CM

## 2015-03-11 DIAGNOSIS — I2583 Coronary atherosclerosis due to lipid rich plaque: Secondary | ICD-10-CM | POA: Diagnosis not present

## 2015-03-11 DIAGNOSIS — I2510 Atherosclerotic heart disease of native coronary artery without angina pectoris: Secondary | ICD-10-CM

## 2015-03-11 DIAGNOSIS — Z0000 Encounter for general adult medical examination without abnormal findings: Secondary | ICD-10-CM | POA: Insufficient documentation

## 2015-03-11 DIAGNOSIS — J310 Chronic rhinitis: Secondary | ICD-10-CM

## 2015-03-11 DIAGNOSIS — E669 Obesity, unspecified: Secondary | ICD-10-CM | POA: Insufficient documentation

## 2015-03-11 DIAGNOSIS — Z23 Encounter for immunization: Secondary | ICD-10-CM | POA: Diagnosis not present

## 2015-03-11 LAB — HEPATIC FUNCTION PANEL
ALK PHOS: 70 U/L (ref 40–115)
ALT: 17 U/L (ref 9–46)
AST: 26 U/L (ref 10–35)
Albumin: 3.9 g/dL (ref 3.6–5.1)
BILIRUBIN INDIRECT: 0.4 mg/dL (ref 0.2–1.2)
Bilirubin, Direct: 0.1 mg/dL (ref <=0.2)
TOTAL PROTEIN: 6.7 g/dL (ref 6.1–8.1)
Total Bilirubin: 0.5 mg/dL (ref 0.2–1.2)

## 2015-03-11 LAB — CBC WITH DIFFERENTIAL/PLATELET
BASOS ABS: 0 10*3/uL (ref 0.0–0.1)
Basophils Relative: 0 % (ref 0–1)
Eosinophils Absolute: 0 10*3/uL (ref 0.0–0.7)
Eosinophils Relative: 0 % (ref 0–5)
HEMATOCRIT: 43.6 % (ref 39.0–52.0)
HEMOGLOBIN: 15.7 g/dL (ref 13.0–17.0)
LYMPHS PCT: 12 % (ref 12–46)
Lymphs Abs: 1.5 10*3/uL (ref 0.7–4.0)
MCH: 31.7 pg (ref 26.0–34.0)
MCHC: 36 g/dL (ref 30.0–36.0)
MCV: 87.9 fL (ref 78.0–100.0)
MONO ABS: 1 10*3/uL (ref 0.1–1.0)
MPV: 10 fL (ref 8.6–12.4)
Monocytes Relative: 8 % (ref 3–12)
NEUTROS ABS: 9.8 10*3/uL — AB (ref 1.7–7.7)
Neutrophils Relative %: 80 % — ABNORMAL HIGH (ref 43–77)
Platelets: 169 10*3/uL (ref 150–400)
RBC: 4.96 MIL/uL (ref 4.22–5.81)
RDW: 14.4 % (ref 11.5–15.5)
WBC: 12.2 10*3/uL — AB (ref 4.0–10.5)

## 2015-03-11 LAB — LIPID PANEL
Cholesterol: 195 mg/dL (ref 125–200)
HDL: 35 mg/dL — ABNORMAL LOW (ref >=40)
LDL CALC: 86 mg/dL (ref <130)
Total CHOL/HDL Ratio: 5.6 Ratio — ABNORMAL HIGH (ref <=5.0)
Triglycerides: 368 mg/dL — ABNORMAL HIGH (ref <150)
VLDL: 74 mg/dL — AB (ref <30)

## 2015-03-11 LAB — TSH: TSH: 0.457 u[IU]/mL (ref 0.350–4.500)

## 2015-03-11 LAB — BASIC METABOLIC PANEL WITH GFR
BUN: 20 mg/dL (ref 7–25)
CHLORIDE: 100 mmol/L (ref 98–110)
CO2: 25 mmol/L (ref 20–31)
Calcium: 9.7 mg/dL (ref 8.6–10.3)
Creat: 0.88 mg/dL (ref 0.70–1.18)
GFR, EST NON AFRICAN AMERICAN: 86 mL/min (ref >=60)
GFR, Est African American: >89 mL/min (ref >=60)
GLUCOSE: 94 mg/dL (ref 65–99)
POTASSIUM: 3.8 mmol/L (ref 3.5–5.3)
Sodium: 136 mmol/L (ref 135–146)

## 2015-03-11 LAB — MAGNESIUM: Magnesium: 2 mg/dL (ref 1.5–2.5)

## 2015-03-11 MED ORDER — NITROGLYCERIN 0.4 MG SL SUBL: SUBLINGUAL_TABLET | SUBLINGUAL | Status: DC

## 2015-03-11 NOTE — Progress Notes (Signed)
Patient ID: [name], male   DOB: 28-Sep-1945, 69 y.o.   MRN: [id number]   Comprehensive Examination  This very nice 69 y.o. MWM presents for complete physical.  Patient has been followed for HTN,ASCAD, T2_NIDDM  , Hyperlipidemia, and Vitamin D Deficiency.   HTN predates since 1999. Patient's BP has been controlled at home.Today's BP: (!) 146/72 mmHg. In 1992, patient had 2 stents inserted for ACS and shortly there after was sent to [facility] and had Laser angioplasty x 2. F/u Cath in 2012 - found patent stents.  Patient denies any recent cardiac symptoms as chest pain, palpitations, shortness of breath, dizziness or ankle swelling. He exercises by walking most days.    Patient's hyperlipidemia is controlled with diet and medications. Patient denies myalgias or other medication SE's. Last lipids were at goal with  Cholesterol 176; HDL 37*; LDL 87; but elevated Triglycerides 260 on 12/09/2014.    Patient has diet controlled T2_NIDDM with A1c 6.2% since 2012 and with dieting & weight loss A1c dropped to 5.8% in 2013 and to 5.5% in 2014.  He denies reactive hypoglycemic symptoms, visual blurring, diabetic polys or paresthesias. Last A1c was  5.6% on 12/09/2014.   In 2012 he had a TURP for BPR w/ prostatism sx's. Patient has low Testosterone level of 250 & 298 in 2012 and he has deferred treatment Finally, patient has history of Vitamin D Deficiency of 45 on treatment in 2008 and last vitamin D was  79 on 12/09/2014.     Medication Sig  . Ascorbic Acid (VITAMIN C) 1000 MG tablet Take 1,000 mg by mouth daily.  Marland Kitchen aspirin EC 81 MG tablet Take 81 mg by mouth daily.  . B Complex Vitamins (B COMPLEX PO) Take by mouth daily.  . bisoprolol-hydrochlorothiazide (ZIAC) 2.5-6.25 MG per tablet TAKE 1 TABLET BY MOUTH DAILY   . Choline Dihydrogen Citrate (CHOLINE CITRATE) 650 MG Take by mouth.  . clobetasol (TEMOVATE) 0.05 % external solution Apply 1 application topically daily.  . clopidogrel (PLAVIX) 75 MG tablet  TAKE 1 TABLET BY MOUTH ONCE DAILY  . Coenzyme Q10 (CO Q 10) 100 MG CAPS Take 100 mg by mouth daily.   . fish oil-omega-3 fatty acids 1000 MG capsule Take 2 g by mouth daily.   . Flaxseed, Linseed, (FLAX SEED OIL) 1000 MG CAPS Take 1,000 mg by mouth 2 (two) times daily.   . fluocinonide (LIDEX) 0.05 % external solution   . folic acid (FOLVITE) 419 MCG tablet Take 800 mcg by mouth daily.  Marland Kitchen ipratropium (ATROVENT) 0.03 % nasal spray 1-2 puffs each nostril every 6 hours if needed  . Magnesium 500 MG TABS Take 500 mg by mouth 2 (two) times daily.  Marland Kitchen MELATONIN PO Take 30 mg by mouth daily.  . Multiple Vitamin (MULTIVITAMIN) capsule Take 1 capsule by mouth daily.    . naproxen sodium (ANAPROX) 220 MG tablet Take 220 mg by mouth daily as needed.  . niacin 500 MG tablet Take  2 x times daily.  Marland Kitchen OVER THE COUNTER MEDICATION Takes ALA(alpha lipoic)  . Turmeric 500 MG CAPS Take 500 mg by mouth daily.   . nitroGLYCERIN (NITROSTAT) 0.4 MG SL tablet Place  under  tongue every 5  minutes as needed.    Allergies  Allergen Reactions  . Adhesive [Tape]     SKIN PEELS  . Codeine Itching    Also hallucinations   . Crestor [Rosuvastatin]   . Cymbalta [Duloxetine Hcl]   . Dilaudid [Hydromorphone  Hcl]   . Effexor [Venlafaxine]   . Morphine And Related Other (See Comments)    Hallucinations  . Nsaids Other (See Comments)    BLEEDING  . Percocet [Oxycodone-Acetaminophen]   . Sulfa Antibiotics   . Topamax [Topiramate]   . Trazodone And Nefazodone   . Penicillins Rash   Past Medical History  Diagnosis Date  . CAD (coronary artery disease)     pt last left heart cath was in Nov 2008. EF was 55% on left ventriculogram. Circumflex was totally occluded after the 1st obtuse marginal with collaterals supplying the distal circumflex. LAD showed luminal irregularities. The stent in LAD was patent. The RCA showed luminal irregularities. The stent in th emid RCA and PDA were patent. There were no inverventions.    . Obesity   . Low back pain     chronic  . GERD (gastroesophageal reflux disease)     hiatal hernia. Patient did hav ea Nissen fundoplication  . Peptic ulcer disease   . DM2 (diabetes mellitus, type 2)     well controlled  . Spinal fracture     hx of traumatic in Jan 2009 after falling off the roof  . Chronic obstructive pulmonary disease     asthma  . Benign prostatic hypertrophy   . Osteoarthritis   . Anxiety     treated /w Xanax for mild depression , using 2 times per day, not PRN  . Depression   . Asthma   . Sleep concern     states the study (2003 )was done at [facility]., it failed & he was told to return & he never did. Pt. states he has been found by prev. hosp. staff that he has to be told when to breathe & his wife does the same.   . Cluster headache     relative to sinus problems   . Cluster headache   . History of blood transfusion     need for Bld. transfusion relative to taking NSAIDS  . Cancer     basal cell- face & head  . 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 HTN (hypertension)     pt. followed by [facility], last cardiac visit 2012, one yr. ago   Health Maintenance  Topic Date Due  . Hepatitis C Screening  1945-09-24  . OPHTHALMOLOGY EXAM  10/29/1953  . COLONOSCOPY  10/29/1993  . TETANUS/TDAP  06/13/2014  . INFLUENZA VACCINE  01/12/2015  . FOOT EXAM  03/04/2015  . URINE MICROALBUMIN  03/04/2015  . HEMOGLOBIN A1C  06/10/2015  . ZOSTAVAX  Completed  . PNA vac Low Risk Adult  Completed   Immunization History  Administered Date(s) Administered  . DT 12/09/2014  . Influenza Split 02/17/2012  . Influenza, High Dose Seasonal PF 03/03/2014, 03/11/2015  . Pneumococcal Conjugate-13 12/25/2014  . Pneumococcal Polysaccharide-23 01/19/2012  . Pneumococcal-Unspecified 06/13/1997  . Td 06/13/2004  . Zoster 06/14/2007   Past Surgical History  Procedure Laterality Date  . Hiatal hernia  repair    . Lumbar spine surgery      x4  . Right temporal artery biopsy    . C5-t6 posterior fusion      with [place]asis and radius screws  . Right carpal tunnel release    . Esophageal manometry    . Laparoscopic cholecystectomy      & IOC  . Umbilical hernia repair    . Multiple  percutaneous coronary interventions    . Back surgery      x3- last surgery lumbar- 1998- / fusion   . Cataracts      cataracts removed- /w IOL - both eyes   . Blepheroplasty      both eyes  . Eye surgery    . Nasal sinus surgery      x2, sees Dr. [name], still having problems, states he uses Benadryl PRN- up to 5 times per day   . Cardiac catheterization      2008 & 2012 multiple stents    . Hardware removal  02/20/2012    Procedure: HARDWARE REMOVAL;  Surgeon: [name], MD;  Location: [facility];  Service: Neurosurgery;  Laterality: N/A;  Hardware Removal   Family History  Problem Relation Age of Onset  . Heart failure Mother     in her 88s  . Coronary artery disease Father     developed in his 36s  . Heart attack Brother     in there 10s  . Heart attack Brother     in there 25s  . Gout Sister    Social History   Social History  . Marital Status: Married    Spouse Name: N/A  . Number of Children: N/A  . Years of Education: N/A   Occupational History  . Not on file.   Social History Main Topics  . Smoking status: Former Smoker    Quit date: 02/14/1978  . Smokeless tobacco: Not on file  . Alcohol Use: 0.0 oz/week    0 Standard drinks or equivalent per week     Comment: rare  . Drug Use: No  . Sexual Activity: Not on file   Other Topics Concern  . Not on file   Social History Narrative    ROS Constitutional: Denies fever, chills, weight loss/gain, headaches, insomnia,  night sweats or change in appetite. Does c/o fatigue. Eyes: Denies redness, blurred vision, diplopia, discharge, itchy or watery eyes.  ENT: Denies discharge, congestion, post nasal drip, epistaxis, sore throat,  earache, hearing loss, dental pain, Tinnitus, Vertigo, Sinus pain or snoring.  Cardio: Denies chest pain, palpitations, irregular heartbeat, syncope, dyspnea, diaphoresis, orthopnea, PND, claudication or edema Respiratory: denies cough, dyspnea, DOE, pleurisy, hoarseness, laryngitis or wheezing.  Gastrointestinal: Denies dysphagia, heartburn, reflux, water brash, pain, cramps, nausea, vomiting, bloating, diarrhea, constipation, hematemesis, melena, hematochezia, jaundice or hemorrhoids Genitourinary: Denies dysuria, frequency, urgency, nocturia, hesitancy, discharge, hematuria or flank pain Musculoskeletal: Denies arthralgia, myalgia, stiffness, Jt. Swelling, pain, limp or strain/sprain. Denies Falls. Skin: Denies puritis, rash, hives, warts, acne, eczema or change in skin lesion Neuro: No weakness, tremor, incoordination, spasms, paresthesia or pain Psychiatric: Denies confusion, memory loss or sensory loss. Denies Depression. Endocrine: Denies change in weight, skin, hair change, nocturia, and paresthesia, diabetic polys, visual blurring or hyper / hypo glycemic episodes.  Heme/Lymph: No excessive bleeding, bruising or enlarged lymph nodes.  Physical Exam  BP 146/72 mmHg  Pulse 64  Temp(Src) 98.1 F (36.7 C)  Resp 16  Ht 5' 7.5" (1.715 m)  Wt 191 lb 6.4 oz (86.818 kg)  BMI 29.52 kg/m2  General Appearance: Well nourished &  in no apparent distress. Eyes: PERRLA, EOMs, conjunctiva no swelling or erythema, normal fundi and vessels. Sinuses: No frontal/maxillary tenderness ENT/Mouth: EACs patent / TMs  nl. Nares clear without erythema, swelling, mucoid exudates. Oral hygiene is good. No erythema, swelling, or exudate. Tongue normal, non-obstructing. Tonsils not swollen or erythematous. Hearing normal.  Neck:  Supple, thyroid normal. No bruits, nodes or JVD. Respiratory: Respiratory effort normal.  BS equal and clear bilateral without rales, rhonci, wheezing or stridor. Cardio: Heart sounds  are normal with regular rate and rhythm and no murmurs, rubs or gallops. Peripheral pulses are normal and equal bilaterally without edema. No aortic or femoral bruits. Chest: symmetric with normal excursions and percussion.  Abdomen: Flat, soft, with bowel sounds. Nontender, no guarding, rebound, hernias, masses, or organomegaly.  Lymphatics: Non tender without lymphadenopathy.  Genitourinary: No hernias.Testes nl. DRE - prostate nl for age - smooth & firm w/o nodules. Musculoskeletal: Full ROM all peripheral extremities, joint stability, 5/5 strength, and normal gait. Skin: Warm and dry without rashes, lesions, cyanosis, clubbing or  ecchymosis.  Neuro: Cranial nerves intact, reflexes equal bilaterally. Normal muscle tone, no cerebellar symptoms. Sensation intact.  Pysch: Alert and oriented X 3 with normal affect, insight and judgment appropriate.   Assessment and Plan  1. Essential hypertension  - EKG 12-Lead - [name], RETROPERITNL ABD,  LTD - TSH  2. Hyperlipidemia  - Lipid panel  3. CKD stage 2 due to type 2 diabetes mellitus  - Microalbumin / creatinine urine ratio - HM DIABETES FOOT EXAM - LOW EXTREMITY NEUR EXAM DOCUM - Hemoglobin A1c - Insulin, random  4. Vitamin D deficiency  - Vit D  25 hydroxy   5. Coronary atherosclerosis due to lipid rich plaque   6. Morbid obesity (BMI 29.22)   7. BMI 29.0-29.9,adult   8. Food allergy   9. Rhinitis, nonallergic, chronic   10. BPH/Prostatism  - PSA  11. Screening for rectal cancer  - POC Hemoccult Bld/Stl  - PSA  12. Prostate cancer screening   13. Depression screen   14. Need for prophylactic vaccination and inoculation against influenza  - Flu vaccine HIGH DOSE PF (Fluzone High dose)  15. Medication management  - Urinalysis, Routine w reflex microscopic  - CBC with Differential/Platelet - BASIC METABOLIC PANEL WITH GFR - Hepatic function panel - Magnesium  16. At low risk for fall    Continue  prudent diet as discussed, weight control, BP monitoring, regular exercise, and medications as discussed.  Discussed med effects and SE's. Routine screening labs and tests as requested with regular follow-up as recommended.  Over 40 minutes of exam, counseling &  chart review was performed

## 2015-03-11 NOTE — Patient Instructions (Signed)

## 2015-03-12 LAB — URINALYSIS, ROUTINE W REFLEX MICROSCOPIC
BILIRUBIN URINE: NEGATIVE
Glucose, UA: NEGATIVE
Hgb urine dipstick: NEGATIVE
Ketones, ur: NEGATIVE
Leukocytes, UA: NEGATIVE
Nitrite: NEGATIVE
Protein, ur: NEGATIVE
SPECIFIC GRAVITY, URINE: 1.015 (ref 1.001–1.035)
pH: 6 (ref 5.0–8.0)

## 2015-03-12 LAB — MICROALBUMIN / CREATININE URINE RATIO
Creatinine, Urine: 67.6 mg/dL
MICROALB UR: 9.7 mg/dL — AB (ref <2.0)
MICROALB/CREAT RATIO: 143.5 mg/g — AB (ref 0.0–30.0)

## 2015-03-12 LAB — PSA: PSA: 3.06 ng/mL (ref <=4.00)

## 2015-03-12 LAB — VITAMIN D 25 HYDROXY (VIT D DEFICIENCY, FRACTURES): Vit D, 25-Hydroxy: 65 ng/mL (ref 30–100)

## 2015-03-12 LAB — HEMOGLOBIN A1C
HEMOGLOBIN A1C: 5.5 % (ref <5.7)
MEAN PLASMA GLUCOSE: 111 mg/dL (ref <117)

## 2015-03-12 LAB — INSULIN, RANDOM: Insulin: 13.9 u[IU]/mL (ref 2.0–19.6)

## 2015-04-13 ENCOUNTER — Other Ambulatory Visit: Payer: Self-pay | Admitting: *Deleted

## 2015-04-13 DIAGNOSIS — Z1212 Encounter for screening for malignant neoplasm of rectum: Secondary | ICD-10-CM

## 2015-04-13 LAB — POC HEMOCCULT BLD/STL (HOME/3-CARD/SCREEN)
FECAL OCCULT BLD: NEGATIVE
FECAL OCCULT BLD: NEGATIVE
Fecal Occult Blood, POC: NEGATIVE

## 2015-05-03 ENCOUNTER — Encounter: Payer: Self-pay | Admitting: *Deleted

## 2015-05-31 ENCOUNTER — Other Ambulatory Visit: Payer: Self-pay | Admitting: Internal Medicine

## 2015-05-31 DIAGNOSIS — I2510 Atherosclerotic heart disease of native coronary artery without angina pectoris: Secondary | ICD-10-CM

## 2015-05-31 MED ORDER — CLOPIDOGREL BISULFATE 75 MG PO TABS: 75.0000 mg | ORAL_TABLET | Freq: Every day | ORAL | Status: DC

## 2015-06-03 ENCOUNTER — Other Ambulatory Visit: Payer: Self-pay | Admitting: *Deleted

## 2015-06-03 DIAGNOSIS — I2510 Atherosclerotic heart disease of native coronary artery without angina pectoris: Secondary | ICD-10-CM

## 2015-06-03 MED ORDER — CLOPIDOGREL BISULFATE 75 MG PO TABS: 75.0000 mg | ORAL_TABLET | Freq: Every day | ORAL | Status: DC

## 2015-06-12 ENCOUNTER — Encounter: Payer: Self-pay | Admitting: Internal Medicine

## 2015-06-12 ENCOUNTER — Ambulatory Visit (INDEPENDENT_AMBULATORY_CARE_PROVIDER_SITE_OTHER): Payer: Medicare Other | Admitting: Internal Medicine

## 2015-06-12 VITALS — BP 142/80 | HR 74 | Temp 98.0°F | Resp 16 | Ht 67.75 in | Wt 194.0 lb

## 2015-06-12 DIAGNOSIS — E559 Vitamin D deficiency, unspecified: Secondary | ICD-10-CM | POA: Diagnosis not present

## 2015-06-12 DIAGNOSIS — E1122 Type 2 diabetes mellitus with diabetic chronic kidney disease: Secondary | ICD-10-CM

## 2015-06-12 DIAGNOSIS — I10 Essential (primary) hypertension: Secondary | ICD-10-CM

## 2015-06-12 DIAGNOSIS — N182 Chronic kidney disease, stage 2 (mild): Secondary | ICD-10-CM

## 2015-06-12 DIAGNOSIS — Z79899 Other long term (current) drug therapy: Secondary | ICD-10-CM

## 2015-06-12 DIAGNOSIS — E782 Mixed hyperlipidemia: Secondary | ICD-10-CM

## 2015-06-12 MED ORDER — PREDNISONE 20 MG PO TABS: ORAL_TABLET | ORAL | Status: DC

## 2015-06-12 NOTE — Progress Notes (Signed)
Patient ID: [name], male   DOB: [birth year], 70 y.o.   MRN: [id number]  Assessment and Plan:  Hypertension:  -Continue medication -monitor blood pressure at home. -Continue DASH diet -Reminder to go to the ER if any CP, SOB, nausea, dizziness, severe HA, changes vision/speech, left arm numbness and tingling and jaw pain.  Cholesterol - Continue diet and exercise -Check cholesterol.   Diabetes with diabetic chronic kidney disease -Continue diet and exercise.  -Check A1C  Vitamin D Def -check level -continue medications.   Acute URI -prednisone -continued nasal saline -use ipatropium spray  Insomnia -continue exercise  Continue diet and meds as discussed. Further disposition pending results of labs. Discussed med's effects and SE's.    HPI 69 y.o. male  presents for 3 month follow up with hypertension, hyperlipidemia, diabetes and vitamin D deficiency.   His blood pressure has been controlled at home, today their BP is BP: (!) 142/80 mmHg.He does not workout. He denies chest pain, shortness of breath, dizziness.   He is on cholesterol medication and denies myalgias. His cholesterol is at goal. The cholesterol was:  03/11/2015: Cholesterol 195; HDL 35*; LDL Cholesterol 86; Triglycerides 368*   He has been working on diet and exercise for diabetes with diabetic chronic kidney disease, he is on bASA, he is on ACE/ARB, and denies  foot ulcerations, hyperglycemia, hypoglycemia , increased appetite, nausea, paresthesia of the feet, polydipsia, polyuria, visual disturbances, vomiting and weight loss. Last A1C was: 03/11/2015: Hgb A1c MFr Bld 5.5   Patient is on Vitamin D supplement. 03/11/2015: Vit D, 25-Hydroxy 65  Patient reports that he has been under a lot of stress recently, but he reports that it hasn't changed. He has a hard time falling asleep after he gets up to go to the bathroom. He has had issues with sleep before.  He tried taking melatonin.  He reports that he  has an issue with memory when he takes sleep issues.    He reports that he is having 2-3 days of nasal congestion and sinus pressure.  He reports that other than the doxalymine he has not taken anything for it.     Current Medications:  Current Outpatient Prescriptions on File Prior to Visit  Medication Sig Dispense Refill  . Ascorbic Acid (VITAMIN C) 1000 MG tablet Take 1,000 mg by mouth daily.    Marland Kitchen aspirin EC 81 MG tablet Take 81 mg by mouth daily.    . B Complex Vitamins (B COMPLEX PO) Take by mouth daily.    . bisoprolol-hydrochlorothiazide (ZIAC) 2.5-6.25 MG per tablet TAKE 1 TABLET BY MOUTH DAILY FOR BLOOD PRESSURE AND HEART 90 tablet 2  . Choline Dihydrogen Citrate (CHOLINE CITRATE) 650 MG TABS Take by mouth.    . clobetasol (TEMOVATE) 0.05 % external solution Apply 1 application topically daily. 50 mL 0  . clopidogrel (PLAVIX) 75 MG tablet Take 1 tablet (75 mg total) by mouth daily. 90 tablet 1  . Coenzyme Q10 (CO Q 10) 100 MG CAPS Take 100 mg by mouth daily.     . fish oil-omega-3 fatty acids 1000 MG capsule Take 2 g by mouth daily.     . Flaxseed, Linseed, (FLAX SEED OIL) 1000 MG CAPS Take 1,000 mg by mouth 2 (two) times daily.     . fluocinonide (LIDEX) 0.05 % external solution     . folic acid (FOLVITE) 660 MCG tablet Take 800 mcg by mouth daily.    Marland Kitchen ipratropium (ATROVENT) 0.03 % nasal spray  1-2 puffs each nostril every 6 hours if needed, before meals 30 mL 12  . Magnesium 500 MG TABS Take 500 mg by mouth 2 (two) times daily.    Marland Kitchen MELATONIN PO Take 30 mg by mouth daily.    . Multiple Vitamin (MULTIVITAMIN) capsule Take 1 capsule by mouth daily.      . naproxen sodium (ANAPROX) 220 MG tablet Take 220 mg by mouth daily as needed.    . niacin 500 MG tablet Take 500 mg by mouth 2 (two) times daily.    . nitroGLYCERIN (NITROSTAT) 0.4 MG SL tablet DISSOLVE 1 TABLET UNDER TONGUE EVERY 3 TO 5 MINUTES FOR ANGINA 225 tablet 98  . OVER THE COUNTER MEDICATION Takes ALA(alpha lipoic)    .  Turmeric 500 MG CAPS Take 500 mg by mouth daily.      No current facility-administered medications on file prior to visit.   Medical History:  Past Medical History  Diagnosis Date  . CAD (coronary artery disease)     pt last left heart cath was in Nov 2008. EF was 55% on left ventriculogram. Circumflex was totally occluded after the 1st obtuse marginal with collaterals supplying the distal circumflex. LAD showed luminal irregularities. The stent in LAD was patent. The RCA showed luminal irregularities. The stent in th emid RCA and PDA were patent. There were no inverventions.   . Obesity   . Low back pain     chronic  . GERD (gastroesophageal reflux disease)     hiatal hernia. Patient did hav ea Nissen fundoplication  . Peptic ulcer disease   . DM2 (diabetes mellitus, type 2) ([place])     well controlled  . Spinal fracture     hx of traumatic in Jan 2009 after falling off the roof  . Chronic obstructive pulmonary disease (HCC)     asthma  . Benign prostatic hypertrophy   . Osteoarthritis   . Anxiety     treated /w Xanax for mild depression , using 2 times per day, not PRN  . Depression   . Asthma   . Sleep concern     states the study (2003 )was done at [facility]., it failed & he was told to return & he never did. Pt. states he has been found by prev. hosp. staff that he has to be told when to breathe & his wife does the same.   . Cluster headache     relative to sinus problems   . Cluster headache   . History of blood transfusion     need for Bld. transfusion relative to taking NSAIDS  . Cancer ([place])     basal cell- face & head  . 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 HTN (hypertension)     pt. followed by [facility], last cardiac visit 2012, one yr. ago   Allergies:  Allergies  Allergen Reactions  . Adhesive [Tape]     SKIN PEELS  . Codeine Itching    Also hallucinations   . Crestor  [Rosuvastatin]   . Cymbalta [Duloxetine Hcl]   . Dilaudid [Hydromorphone Hcl]   . Effexor [Venlafaxine]   . Morphine And Related Other (See Comments)    Hallucinations  . Nsaids Other (See Comments)    BLEEDING  . Percocet [Oxycodone-Acetaminophen]   . Sulfa Antibiotics   . Topamax [Topiramate]   . Trazodone And Nefazodone   .  Penicillins Rash     Review of Systems:  Review of Systems  Constitutional: Negative for fever, chills and malaise/fatigue.  HENT: Positive for congestion. Negative for ear pain and sore throat.   Eyes: Negative.   Respiratory: Positive for cough. Negative for shortness of breath and wheezing.   Cardiovascular: Negative for chest pain, palpitations and leg swelling.  Gastrointestinal: Negative for heartburn, diarrhea, constipation, blood in stool and melena.  Genitourinary: Negative.   Skin: Negative.   Neurological: Negative for dizziness, sensory change, loss of consciousness and headaches.  Psychiatric/Behavioral: Negative for depression. The patient is not nervous/anxious and does not have insomnia.     Family history- Review and unchanged  Social history- Review and unchanged  Physical Exam: BP 142/80 mmHg  Pulse 74  Temp(Src) 98 F (36.7 C) (Temporal)  Resp 16  Ht 5' 7.75" (1.721 m)  Wt 194 lb (87.998 kg)  BMI 29.71 kg/m2 Wt Readings from Last 3 Encounters:  06/12/15 194 lb (87.998 kg)  03/11/15 191 lb 6.4 oz (86.818 kg)  02/24/15 189 lb 3.2 oz (85.821 kg)   General Appearance: Well nourished well developed, non-toxic appearing, in no apparent distress. Eyes: PERRLA, EOMs, conjunctiva no swelling or erythema ENT/Mouth: Ear canals clear with no erythema, swelling, or discharge.  TMs normal bilaterally, oropharynx clear, moist, with no exudate.   Neck: Supple, thyroid normal, no JVD, no cervical adenopathy.  Respiratory: Respiratory effort normal, breath sounds clear A&P, no wheeze, rhonchi or rales noted.  No retractions, no accessory  muscle usage Cardio: RRR with no MRGs. No noted edema.  Abdomen: Soft, + BS.  Non tender, no guarding, rebound, hernias, masses. Musculoskeletal: Full ROM, 5/5 strength, Normal gait Skin: Warm, dry without rashes, lesions, ecchymosis.  Neuro: Awake and oriented X 3, Cranial nerves intact. No cerebellar symptoms.  Psych: normal affect, Insight and Judgment appropriate.    Starlyn Skeans, PA-C 10:38 AM [facility]

## 2015-06-18 ENCOUNTER — Encounter: Payer: Self-pay | Admitting: Internal Medicine

## 2015-06-18 ENCOUNTER — Ambulatory Visit (INDEPENDENT_AMBULATORY_CARE_PROVIDER_SITE_OTHER): Payer: Medicare Other | Admitting: Internal Medicine

## 2015-06-18 VITALS — BP 136/72 | HR 62 | Temp 97.8°F | Resp 16 | Ht 67.75 in | Wt 194.0 lb

## 2015-06-18 DIAGNOSIS — J069 Acute upper respiratory infection, unspecified: Secondary | ICD-10-CM | POA: Diagnosis not present

## 2015-06-18 DIAGNOSIS — H6593 Unspecified nonsuppurative otitis media, bilateral: Secondary | ICD-10-CM | POA: Diagnosis not present

## 2015-06-18 DIAGNOSIS — R42 Dizziness and giddiness: Secondary | ICD-10-CM | POA: Diagnosis not present

## 2015-06-18 MED ORDER — DIAZEPAM 5 MG PO TABS: 5.0000 mg | ORAL_TABLET | Freq: Three times a day (TID) | ORAL | Status: DC | PRN

## 2015-06-18 MED ORDER — AZITHROMYCIN 250 MG PO TABS: ORAL_TABLET | ORAL | Status: DC

## 2015-06-18 NOTE — Progress Notes (Signed)
   Subjective:    Patient ID: [name], male    DOB: 02-10-1946, 70 y.o.   MRN: [id number]  Dizziness Associated symptoms include congestion. Pertinent negatives include no chills, fatigue, fever, sore throat or weakness.   Patient presents to the office for evaluation of some mild dizziness and feeling "drunk".  He reports that for the past 3 days he has been unsteady on his feet and he has felt off kilter.  He reports that he has been coughing a lot and still having a lot of congestion.  He reports that he has slept an hour at a time for a short period of time.  He reports that moving fast makes the dizziness worse.  He reports nothing makes it better.     Review of Systems  Constitutional: Negative for fever, chills and fatigue.  HENT: Positive for congestion, ear pain and postnasal drip. Negative for sore throat.   Neurological: Positive for dizziness and light-headedness. Negative for weakness.       Objective:   Physical Exam  Constitutional: He is oriented to person, place, and time. He appears well-developed and well-nourished. No distress.  HENT:  Head: Normocephalic.  Right Ear: No mastoid tenderness. Tympanic membrane is bulging. Tympanic membrane is not injected.  Left Ear: No mastoid tenderness. Tympanic membrane is bulging. Tympanic membrane is not injected.  Mouth/Throat: Oropharynx is clear and moist. No oropharyngeal exudate.  Eyes: Conjunctivae are normal. No scleral icterus.  Neck: Normal range of motion. Neck supple. No JVD present. No thyromegaly present.  Cardiovascular: Normal rate, regular rhythm, normal heart sounds and intact distal pulses.  Exam reveals no gallop and no friction rub.   No murmur heard. Pulmonary/Chest: Effort normal and breath sounds normal. No respiratory distress. He has no wheezes. He has no rales. He exhibits no tenderness.  Musculoskeletal: Normal range of motion.  Lymphadenopathy:    He has no cervical adenopathy.   Neurological: He is alert and oriented to person, place, and time.  Skin: Skin is warm and dry. He is not diaphoretic.  Psychiatric: He has a normal mood and affect. His behavior is normal. Judgment and thought content normal.  Nursing note and vitals reviewed.         Assessment & Plan:    1. Dizziness and giddiness -valium -increase ear and respiratory toiletting  2. Middle ear effusion, bilateral -ipratropium spray -flonase -antihistamine  3. Acute URI -zpak

## 2015-06-18 NOTE — Patient Instructions (Signed)
Please continue to use the ipratropium nasal spray 3 times per day.    Please start using 2 sprays per nostril of your flonase nightly before bedtime.  Please keep using saline in your nose to rinse your nose out.  Please take the zpak until it is gone.  Please take the valium as needed for the dizziness. Do not drive within 4 hours of taking.  Please take 30 minutes prior to falling asleep.

## 2015-07-22 ENCOUNTER — Other Ambulatory Visit: Payer: Self-pay | Admitting: Internal Medicine

## 2015-07-23 ENCOUNTER — Other Ambulatory Visit: Payer: Self-pay | Admitting: Internal Medicine

## 2015-08-05 ENCOUNTER — Other Ambulatory Visit: Payer: Self-pay | Admitting: Internal Medicine

## 2015-09-13 ENCOUNTER — Encounter: Payer: Self-pay | Admitting: Internal Medicine

## 2015-09-13 NOTE — Patient Instructions (Addendum)
Cut the valium in half   Recommend Adult Low Dose Aspirin or   coated  Aspirin 81 mg daily   To reduce risk of Colon Cancer 20 %,   Skin Cancer 26 % ,   Melanoma 46%   and   Pancreatic cancer 60%   ++++++++++++++++++++++++++++++++++++++++++++++++++++++ Vitamin D goal   is between 70-100.   Please make sure that you are taking your Vitamin D as directed.   It is very important as a natural anti-inflammatory   helping hair, skin, and nails, as well as reducing stroke and heart attack risk.   It helps your bones and helps with mood.  It also decreases numerous cancer risks so please take it as directed.   Low Vit D is associated with a 200-300% higher risk for CANCER   and 200-300% higher risk for HEART   ATTACK  &  STROKE.   .....................................Marland Kitchen  It is also associated with higher death rate at younger ages,   autoimmune diseases like Rheumatoid arthritis, Lupus, Multiple Sclerosis.     Also many other serious conditions, like depression, Alzheimer's  Dementia, infertility, muscle aches, fatigue, fibromyalgia - just to name a few.  ++++++++++++++++++++++++++++++++++++++++++++++++  Recommend the book "The END of DIETING" by Dr Excell Seltzer   & the book "The END of DIABETES " by Dr Excell Seltzer  At [email or web address] - get book & Audio CD's     Being diabetic has a  300% increased risk for heart attack, stroke, cancer, and alzheimer- type vascular dementia. It is very important that you work harder with diet by avoiding all foods that are white. Avoid white rice (brown & wild rice is OK), white potatoes (sweetpotatoes in moderation is OK), White bread or wheat bread or anything made out of white flour like bagels, donuts, rolls, buns, biscuits, cakes, pastries, cookies, pizza crust, and pasta (made from white flour & egg whites) - vegetarian pasta or spinach or wheat pasta is OK. Multigrain breads like Arnold's or Pepperidge Farm, or multigrain sandwich  thins or flatbreads.  Diet, exercise and weight loss can reverse and cure diabetes in the early stages.  Diet, exercise and weight loss is very important in the control and prevention of complications of diabetes which affects every system in your body, ie. Brain - dementia/stroke, eyes - glaucoma/blindness, heart - heart attack/heart failure, kidneys - dialysis, stomach - gastric paralysis, intestines - malabsorption, nerves - severe painful neuritis, circulation - gangrene & loss of a leg(s), and finally cancer and Alzheimers.    I recommend avoid fried & greasy foods,  sweets/candy, white rice (brown or wild rice or Quinoa is OK), white potatoes (sweet potatoes are OK) - anything made from white flour - bagels, doughnuts, rolls, buns, biscuits,white and wheat breads, pizza crust and traditional pasta made of white flour & egg white(vegetarian pasta or spinach or wheat pasta is OK).  Multi-grain bread is OK - like multi-grain flat bread or sandwich thins. Avoid alcohol in excess. Exercise is also important.    Eat all the vegetables you want - avoid meat, especially red meat and dairy - especially cheese.  Cheese is the most concentrated form of trans-fats which is the worst thing to clog up our arteries. Veggie cheese is OK which can be found in the fresh produce section at [employer] or [employer] or [employer]  ++++++++++++++++++++++++++++++++++++++++++++++++++ DASH Eating Plan  DASH stands for "Dietary Approaches to Stop Hypertension."   The DASH eating plan is a healthy eating plan that  has been shown to reduce high blood pressure (hypertension). Additional health benefits may include reducing the risk of type 2 diabetes mellitus, heart disease, and stroke. The DASH eating plan may also help with weight loss.  WHAT DO I NEED TO KNOW ABOUT THE DASH EATING PLAN?  For the DASH eating plan, you will follow these general guidelines:  Choose foods with a percent daily value for sodium of less  than 5% (as listed on the food label).  Use salt-free seasonings or herbs instead of table salt or sea salt.  Check with your health care provider or pharmacist before using salt substitutes.  Eat lower-sodium products, often labeled as "lower sodium" or "no salt added."  Eat fresh foods.  Eat more vegetables, fruits, and low-fat dairy products.    Choose whole grains. Look for the word "whole" as the first word in the ingredient list.  Choose fish   Limit sweets, desserts, sugars, and sugary drinks.  Choose heart-healthy fats.  Eat veggie cheese   Eat more home-cooked food and less restaurant, buffet, and fast food.  Limit fried foods.  Cook foods using methods other than frying.  Limit canned vegetables. If you do use them, rinse them well to decrease the sodium.  When eating at a restaurant, ask that your food be prepared with less salt, or no salt if possible.                      WHAT FOODS CAN I EAT?  Read Dr Fara Olden Fuhrman's books on The End of Dieting & The End of Diabetes  Grains  Whole grain or whole wheat bread. Brown rice. Whole grain or whole wheat pasta. Quinoa, bulgur, and whole grain cereals. Low-sodium cereals. Corn or whole wheat flour tortillas. Whole grain cornbread. Whole grain crackers. Low-sodium crackers.  Vegetables  Fresh or frozen vegetables (raw, steamed, roasted, or grilled). Low-sodium or reduced-sodium tomato and vegetable juices. Low-sodium or reduced-sodium tomato sauce and paste. Low-sodium or reduced-sodium canned vegetables.   Fruits  All fresh, canned (in natural juice), or frozen fruits.  Protein Products   All fish and seafood.  Dried beans, peas, or lentils. Unsalted nuts and seeds. Unsalted canned beans.  Dairy  Low-fat dairy products, such as skim or 1% milk, 2% or reduced-fat cheeses, low-fat ricotta or cottage cheese, or plain low-fat yogurt. Low-sodium or reduced-sodium cheeses.  Fats and Oils  Tub margarines  without trans fats. Light or reduced-fat mayonnaise and salad dressings (reduced sodium). Avocado. Safflower, olive, or canola oils. Natural peanut or almond butter.  Other  Unsalted popcorn and pretzels. The items listed above may not be a complete list of recommended foods or beverages. Contact your dietitian for more options.  +++++++++++++++++++++++++++++++++++++++++++  WHAT FOODS ARE NOT RECOMMENDED?  Grains/ White flour or wheat flour  White bread. White pasta. White rice. Refined cornbread. Bagels and croissants. Crackers that contain trans fat.  Vegetables  Creamed or fried vegetables. Vegetables in a . Regular canned vegetables. Regular canned tomato sauce and paste. Regular tomato and vegetable juices.  Fruits  Dried fruits. Canned fruit in light or heavy syrup. Fruit juice.  Meat and Other Protein Products  Meat in general - RED mwaet & White meat.  Fatty cuts of meat. Ribs, chicken wings, bacon, sausage, bologna, salami, chitterlings, fatback, hot dogs, bratwurst, and packaged luncheon meats.  Dairy  Whole or 2% milk, cream, half-and-half, and cream cheese. Whole-fat or sweetened yogurt. Full-fat cheeses or blue cheese. Nondairy creamers and  whipped toppings. Processed cheese, cheese spreads, or cheese curds.  Condiments  Onion and garlic salt, seasoned salt, table salt, and sea salt. Canned and packaged gravies. Worcestershire sauce. Tartar sauce. Barbecue sauce. Teriyaki sauce. Soy sauce, including reduced sodium. Steak sauce. Fish sauce. Oyster sauce. Cocktail sauce. Horseradish. Ketchup and mustard. Meat flavorings and tenderizers. Bouillon cubes. Hot sauce. Tabasco sauce. Marinades. Taco seasonings. Relishes.  Fats and Oils Butter, stick margarine, lard, shortening and bacon fat. Coconut, palm kernel, or palm oils. Regular salad dressings.  Pickles and olives. Salted popcorn and pretzels.  The items listed above may not be a complete list of foods and  beverages to avoid.

## 2015-09-13 NOTE — Progress Notes (Signed)
Patient ID: [name], male   DOB: 02-28-1946, 70 y.o.   MRN: [id number]  Assessment and Plan:  Hypertension:  -Continue medication -monitor blood pressure at home. -Continue DASH diet -Reminder to go to the ER if any CP, SOB, nausea, dizziness, severe HA, changes vision/speech, left arm numbness and tingling and jaw pain.  Cholesterol - Continue diet and exercise -Check cholesterol.   Diabetes with diabetic chronic kidney disease -Continue diet and exercise.  -Check A1C  Vitamin D Def -check level -continue medications.   Insomnia -continue exercise Cut the valium in half  Continue diet and meds as discussed. Further disposition pending results of labs. Discussed med's effects and SE's.    HPI 70 y.o. male  presents for 3 month follow up with hypertension, hyperlipidemia, diabetes and vitamin D deficiency.   His blood pressure has been controlled at home, today their BP is BP: 122/66 mmHg.He does not workout. He denies chest pain, shortness of breath, dizziness.   He is on cholesterol medication and denies myalgias. His cholesterol is at goal. The cholesterol was:  03/11/2015: Cholesterol 195; HDL 35*; LDL Cholesterol 86; Triglycerides 368*   He has been working on diet and exercise for diabetes with diabetic chronic kidney disease, he is on bASA, he is on ACE/ARB, and denies  foot ulcerations, hyperglycemia, hypoglycemia , increased appetite, nausea, paresthesia of the feet, polydipsia, polyuria, visual disturbances, vomiting and weight loss. Last A1C was: 03/11/2015: Hgb A1c MFr Bld 5.5   Patient is on Vitamin D supplement. 03/11/2015: Vit D, 25-Hydroxy 65  Patient reports that he has been under a lot of stress recently, but he reports that it hasn't changed.  He has had issues with sleep before, states valium is helping however he feels tired in the AM, he is taking the '5mg'$  valium.Marland Kitchen   He reports that he has an issue with memory when he has sleep issues but feels that it  improving.    He has been having groin itching on and off for years and itching around ears, neck/head, has tried antifungal cream OTC and fluconazole pills, listerine, vinegar.   Current Medications:  Current Outpatient Prescriptions on File Prior to Visit  Medication Sig Dispense Refill  . Ascorbic Acid (VITAMIN C) 1000 MG tablet Take 1,000 mg by mouth daily.    Marland Kitchen aspirin EC 81 MG tablet Take 81 mg by mouth daily.    . B Complex Vitamins (B COMPLEX PO) Take by mouth daily.    . bisoprolol-hydrochlorothiazide (ZIAC) 2.5-6.25 MG tablet TAKE 1 TABLET BY MOUTH DAILY FOR HIGH BLOOD PRESSURE AND HEART 90 tablet 2  . Choline Dihydrogen Citrate (CHOLINE CITRATE) 650 MG TABS Take by mouth.    . clobetasol (TEMOVATE) 0.05 % external solution Apply 1 application topically daily. 50 mL 0  . clopidogrel (PLAVIX) 75 MG tablet Take 1 tablet (75 mg total) by mouth daily. 90 tablet 1  . Coenzyme Q10 (CO Q 10) 100 MG CAPS Take 100 mg by mouth daily.     . diazepam (VALIUM) 5 MG tablet Take 1/2 to 1 tablet 2 to 3 x day if needed for anxiety 90 tablet 2  . fish oil-omega-3 fatty acids 1000 MG capsule Take 2 g by mouth daily.     . Flaxseed, Linseed, (FLAX SEED OIL) 1000 MG CAPS Take 1,000 mg by mouth 2 (two) times daily.     . fluocinonide (LIDEX) 0.05 % external solution     . folic acid (FOLVITE) 786 MCG tablet  Take 800 mcg by mouth daily.    Marland Kitchen ipratropium (ATROVENT) 0.03 % nasal spray 1-2 puffs each nostril every 6 hours if needed, before meals 30 mL 12  . Magnesium 500 MG TABS Take 500 mg by mouth 2 (two) times daily.    Marland Kitchen MELATONIN PO Take 30 mg by mouth daily.    . Multiple Vitamin (MULTIVITAMIN) capsule Take 1 capsule by mouth daily.      . naproxen sodium (ANAPROX) 220 MG tablet Take 220 mg by mouth daily as needed.    . niacin 500 MG tablet Take 500 mg by mouth 2 (two) times daily.    . nitroGLYCERIN (NITROSTAT) 0.4 MG SL tablet DISSOLVE 1 TABLET UNDER TONGUE EVERY 3 TO 5 MINUTES FOR ANGINA 225  tablet 98  . OVER THE COUNTER MEDICATION Takes ALA(alpha lipoic)    . Turmeric 500 MG CAPS Take 500 mg by mouth daily.      No current facility-administered medications on file prior to visit.   Medical History:  Past Medical History  Diagnosis Date  . CAD (coronary artery disease)     pt last left heart cath was in Nov 2008. EF was 55% on left ventriculogram. Circumflex was totally occluded after the 1st obtuse marginal with collaterals supplying the distal circumflex. LAD showed luminal irregularities. The stent in LAD was patent. The RCA showed luminal irregularities. The stent in th emid RCA and PDA were patent. There were no inverventions.   . Obesity   . Low back pain     chronic  . GERD (gastroesophageal reflux disease)     hiatal hernia. Patient did hav ea Nissen fundoplication  . Peptic ulcer disease   . DM2 (diabetes mellitus, type 2) ([place])     well controlled  . Spinal fracture     hx of traumatic in Jan 2009 after falling off the roof  . Chronic obstructive pulmonary disease (HCC)     asthma  . Benign prostatic hypertrophy   . Osteoarthritis   . Anxiety     treated /w Xanax for mild depression , using 2 times per day, not PRN  . Depression   . Asthma   . Sleep concern     states the study (2003 )was done at [facility]., it failed & he was told to return & he never did. Pt. states he has been found by prev. hosp. staff that he has to be told when to breathe & his wife does the same.   . Cluster headache     relative to sinus problems   . Cluster headache   . History of blood transfusion     need for Bld. transfusion relative to taking NSAIDS  . Cancer ([place])     basal cell- face & head  . 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 HTN (hypertension)     pt. followed by [facility], last cardiac visit 2012, one yr. ago   Allergies:  Allergies  Allergen Reactions  . Adhesive [Tape]     SKIN  PEELS  . Codeine Itching    Also hallucinations   . Crestor [Rosuvastatin]   . Cymbalta [Duloxetine Hcl]   . Dilaudid [Hydromorphone Hcl]   . Effexor [Venlafaxine]   . Morphine And Related Other (See Comments)    Hallucinations  . Nsaids Other (See Comments)    BLEEDING  . Percocet [Oxycodone-Acetaminophen]   .  Sulfa Antibiotics   . Topamax [Topiramate]   . Trazodone And Nefazodone   . Penicillins Rash     Review of Systems:  Review of Systems  Constitutional: Negative for fever, chills and malaise/fatigue.  HENT: Positive for congestion. Negative for ear pain and sore throat.   Eyes: Negative.   Respiratory: Positive for cough. Negative for shortness of breath and wheezing.   Cardiovascular: Negative for chest pain, palpitations and leg swelling.  Gastrointestinal: Negative for heartburn, diarrhea, constipation, blood in stool and melena.  Genitourinary: Negative.   Skin: Negative.   Neurological: Negative for dizziness, sensory change, loss of consciousness and headaches.  Psychiatric/Behavioral: Negative for depression. The patient is not nervous/anxious and does not have insomnia.     Family history- Review and unchanged  Social history- Review and unchanged  Physical Exam: BP 122/66 mmHg  Pulse 60  Temp(Src) 97.3 F (36.3 C)  Resp 16  Ht 5' 7.5" (1.715 m)  Wt 193 lb 12.8 oz (87.907 kg)  BMI 29.89 kg/m2 Wt Readings from Last 3 Encounters:  09/14/15 193 lb 12.8 oz (87.907 kg)  06/18/15 194 lb (87.998 kg)  06/12/15 194 lb (87.998 kg)   General Appearance: Well nourished well developed, non-toxic appearing, in no apparent distress. Eyes: PERRLA, EOMs, conjunctiva no swelling or erythema ENT/Mouth: Ear canals clear with no erythema, swelling, or discharge.  TMs normal bilaterally, oropharynx clear, moist, with no exudate.   Neck: Supple, thyroid normal, no JVD, no cervical adenopathy.  Respiratory: Respiratory effort normal, breath sounds clear A&P, no wheeze,  rhonchi or rales noted.  No retractions, no accessory muscle usage Cardio: RRR with no MRGs. No noted edema.  Abdomen: Soft, + BS.  Non tender, no guarding, rebound, hernias, masses. Musculoskeletal: Full ROM, 5/5 strength, Normal gait Skin: Warm, dry without rashes, lesions, ecchymosis.  Neuro: Awake and oriented X 3, Cranial nerves intact. No cerebellar symptoms.  Psych: normal affect, Insight and Judgment appropriate.    Vicie Mutters, PA-C 11:14 AM [facility]

## 2015-09-14 ENCOUNTER — Ambulatory Visit (INDEPENDENT_AMBULATORY_CARE_PROVIDER_SITE_OTHER): Payer: Medicare Other | Admitting: Physician Assistant

## 2015-09-14 VITALS — BP 122/66 | HR 60 | Temp 97.3°F | Resp 16 | Ht 67.5 in | Wt 193.8 lb

## 2015-09-14 DIAGNOSIS — I10 Essential (primary) hypertension: Secondary | ICD-10-CM | POA: Diagnosis not present

## 2015-09-14 DIAGNOSIS — G4733 Obstructive sleep apnea (adult) (pediatric): Secondary | ICD-10-CM

## 2015-09-14 DIAGNOSIS — Z79899 Other long term (current) drug therapy: Secondary | ICD-10-CM | POA: Diagnosis not present

## 2015-09-14 DIAGNOSIS — E782 Mixed hyperlipidemia: Secondary | ICD-10-CM

## 2015-09-14 DIAGNOSIS — J449 Chronic obstructive pulmonary disease, unspecified: Secondary | ICD-10-CM

## 2015-09-14 DIAGNOSIS — E559 Vitamin D deficiency, unspecified: Secondary | ICD-10-CM

## 2015-09-14 DIAGNOSIS — E1122 Type 2 diabetes mellitus with diabetic chronic kidney disease: Secondary | ICD-10-CM | POA: Diagnosis not present

## 2015-09-14 DIAGNOSIS — N182 Chronic kidney disease, stage 2 (mild): Secondary | ICD-10-CM | POA: Diagnosis not present

## 2015-09-14 DIAGNOSIS — I25700 Atherosclerosis of coronary artery bypass graft(s), unspecified, with unstable angina pectoris: Secondary | ICD-10-CM

## 2015-09-14 DIAGNOSIS — E1129 Type 2 diabetes mellitus with other diabetic kidney complication: Secondary | ICD-10-CM | POA: Diagnosis not present

## 2015-09-14 LAB — BASIC METABOLIC PANEL WITH GFR
BUN: 20 mg/dL (ref 7–25)
CO2: 28 mmol/L (ref 20–31)
Calcium: 9.3 mg/dL (ref 8.6–10.3)
Chloride: 98 mmol/L (ref 98–110)
Creat: 1 mg/dL (ref 0.70–1.18)
GFR, EST AFRICAN AMERICAN: 87 mL/min (ref >=60)
GFR, EST NON AFRICAN AMERICAN: 75 mL/min (ref >=60)
GLUCOSE: 84 mg/dL (ref 65–99)
POTASSIUM: 4.1 mmol/L (ref 3.5–5.3)
SODIUM: 136 mmol/L (ref 135–146)

## 2015-09-14 LAB — CBC WITH DIFFERENTIAL/PLATELET
BASOS PCT: 1 %
Basophils Absolute: 55 cells/uL (ref 0–200)
EOS PCT: 2 %
Eosinophils Absolute: 110 cells/uL (ref 15–500)
HEMATOCRIT: 47.6 % (ref 38.5–50.0)
Hemoglobin: 16.5 g/dL (ref 13.2–17.1)
LYMPHS PCT: 34 %
Lymphs Abs: 1870 cells/uL (ref 850–3900)
MCH: 30.9 pg (ref 27.0–33.0)
MCHC: 34.7 g/dL (ref 32.0–36.0)
MCV: 89.1 fL (ref 80.0–100.0)
MONO ABS: 495 {cells}/uL (ref 200–950)
MPV: 10.6 fL (ref 7.5–12.5)
Monocytes Relative: 9 %
Neutro Abs: 2970 cells/uL (ref 1500–7800)
Neutrophils Relative %: 54 %
PLATELETS: 166 10*3/uL (ref 140–400)
RBC: 5.34 MIL/uL (ref 4.20–5.80)
RDW: 13.8 % (ref 11.0–15.0)
WBC: 5.5 10*3/uL (ref 3.8–10.8)

## 2015-09-14 LAB — LIPID PANEL
CHOL/HDL RATIO: 5 ratio (ref <=5.0)
CHOLESTEROL: 174 mg/dL (ref 125–200)
HDL: 35 mg/dL — AB (ref >=40)
LDL CALC: 90 mg/dL (ref <130)
TRIGLYCERIDES: 243 mg/dL — AB (ref <150)
VLDL: 49 mg/dL — AB (ref <30)

## 2015-09-14 LAB — HEPATIC FUNCTION PANEL
ALK PHOS: 43 U/L (ref 40–115)
ALT: 18 U/L (ref 9–46)
AST: 23 U/L (ref 10–35)
Albumin: 3.9 g/dL (ref 3.6–5.1)
BILIRUBIN DIRECT: 0.1 mg/dL (ref <=0.2)
BILIRUBIN INDIRECT: 0.6 mg/dL (ref 0.2–1.2)
BILIRUBIN TOTAL: 0.7 mg/dL (ref 0.2–1.2)
Total Protein: 6.5 g/dL (ref 6.1–8.1)

## 2015-09-14 LAB — HEMOGLOBIN A1C
Hgb A1c MFr Bld: 5.6 % (ref <5.7)
Mean Plasma Glucose: 114 mg/dL

## 2015-09-14 LAB — MAGNESIUM: Magnesium: 1.7 mg/dL (ref 1.5–2.5)

## 2015-09-14 LAB — TSH: TSH: 0.83 mIU/L (ref 0.40–4.50)

## 2015-09-14 MED ORDER — KETOCONAZOLE 2 % EX SHAM: 1.0000 "application " | MEDICATED_SHAMPOO | CUTANEOUS | Status: DC

## 2015-09-15 LAB — INSULIN, RANDOM: Insulin: 27 u[IU]/mL — ABNORMAL HIGH (ref 2.0–19.6)

## 2015-09-22 DIAGNOSIS — M4316 Spondylolisthesis, lumbar region: Secondary | ICD-10-CM | POA: Diagnosis not present

## 2015-09-22 DIAGNOSIS — I10 Essential (primary) hypertension: Secondary | ICD-10-CM | POA: Diagnosis not present

## 2015-09-22 DIAGNOSIS — M5136 Other intervertebral disc degeneration, lumbar region: Secondary | ICD-10-CM | POA: Diagnosis not present

## 2015-09-22 DIAGNOSIS — Z6837 Body mass index (BMI) 37.0-37.9, adult: Secondary | ICD-10-CM | POA: Diagnosis not present

## 2015-10-07 DIAGNOSIS — Z6837 Body mass index (BMI) 37.0-37.9, adult: Secondary | ICD-10-CM | POA: Diagnosis not present

## 2015-10-07 DIAGNOSIS — I10 Essential (primary) hypertension: Secondary | ICD-10-CM | POA: Diagnosis not present

## 2015-10-07 DIAGNOSIS — M4807 Spinal stenosis, lumbosacral region: Secondary | ICD-10-CM | POA: Diagnosis not present

## 2015-10-22 DIAGNOSIS — L570 Actinic keratosis: Secondary | ICD-10-CM | POA: Diagnosis not present

## 2015-10-22 DIAGNOSIS — L700 Acne vulgaris: Secondary | ICD-10-CM | POA: Diagnosis not present

## 2015-10-22 DIAGNOSIS — L821 Other seborrheic keratosis: Secondary | ICD-10-CM | POA: Diagnosis not present

## 2015-10-22 DIAGNOSIS — Z85828 Personal history of other malignant neoplasm of skin: Secondary | ICD-10-CM | POA: Diagnosis not present

## 2015-10-22 DIAGNOSIS — D225 Melanocytic nevi of trunk: Secondary | ICD-10-CM | POA: Diagnosis not present

## 2015-10-29 DIAGNOSIS — M5137 Other intervertebral disc degeneration, lumbosacral region: Secondary | ICD-10-CM | POA: Diagnosis not present

## 2015-10-29 DIAGNOSIS — Z6837 Body mass index (BMI) 37.0-37.9, adult: Secondary | ICD-10-CM | POA: Diagnosis not present

## 2015-11-03 ENCOUNTER — Other Ambulatory Visit: Payer: Self-pay | Admitting: Neurosurgery

## 2015-11-03 DIAGNOSIS — M5137 Other intervertebral disc degeneration, lumbosacral region: Secondary | ICD-10-CM

## 2015-11-04 ENCOUNTER — Telehealth: Payer: Self-pay | Admitting: Cardiology

## 2015-11-04 NOTE — Telephone Encounter (Signed)
If no changes symptomatically, may hold Plavix x 5 days then restart after.

## 2015-11-04 NOTE — Telephone Encounter (Signed)
Message routed to Dr. Annamarie Major, RN Patient has not been seen since 11/2014 by Gerrianne Scale, PA

## 2015-11-04 NOTE — Telephone Encounter (Signed)
Request for surgical clearance:  1. What type of surgery is being performed? Lumbar Epidural Injection   2. When is this surgery scheduled? Have not scheuled    3. Are there any medications that need to be held prior to surgery and how long?Can he stop his Plavix 5 days prior to injection?   4. Name of physician performing surgery? Do not know at this time   5. What is your office phone and fax number? [phone] and fax number is [phone] [email or web address]  6.

## 2015-11-05 DIAGNOSIS — M545 Low back pain: Secondary | ICD-10-CM | POA: Diagnosis not present

## 2015-11-05 DIAGNOSIS — R262 Difficulty in walking, not elsewhere classified: Secondary | ICD-10-CM | POA: Diagnosis not present

## 2015-11-05 DIAGNOSIS — M6281 Muscle weakness (generalized): Secondary | ICD-10-CM | POA: Diagnosis not present

## 2015-11-05 DIAGNOSIS — M5137 Other intervertebral disc degeneration, lumbosacral region: Secondary | ICD-10-CM | POA: Diagnosis not present

## 2015-11-05 NOTE — Telephone Encounter (Signed)
Pt states no new cardiac symptoms, pt advised he may hold Plavix for 5 days then restart after.

## 2015-11-10 ENCOUNTER — Ambulatory Visit
Admission: RE | Admit: 2015-11-10 | Discharge: 2015-11-10 | Disposition: A | Discharge status: Home / Self Care | Payer: Medicare Other | Source: Ambulatory Visit | Attending: Neurosurgery | Admitting: Neurosurgery

## 2015-11-10 ENCOUNTER — Other Ambulatory Visit: Payer: Self-pay | Admitting: Neurosurgery

## 2015-11-10 DIAGNOSIS — M5416 Radiculopathy, lumbar region: Secondary | ICD-10-CM | POA: Diagnosis not present

## 2015-11-10 DIAGNOSIS — M5137 Other intervertebral disc degeneration, lumbosacral region: Secondary | ICD-10-CM

## 2015-11-10 MED ORDER — IOPAMIDOL (ISOVUE-M 200) INJECTION 41%
1.0000 mL | Freq: Once | INTRAMUSCULAR | Status: AC
  Administered 2015-11-10: 1 mL via EPIDURAL

## 2015-11-10 MED ORDER — METHYLPREDNISOLONE ACETATE 40 MG/ML INJ SUSP (RADIOLOG
120.0000 mg | Freq: Once | INTRAMUSCULAR | Status: AC
  Administered 2015-11-10: 120 mg via EPIDURAL

## 2015-11-10 NOTE — Discharge Instructions (Addendum)

## 2015-11-13 DIAGNOSIS — M545 Low back pain: Secondary | ICD-10-CM | POA: Diagnosis not present

## 2015-11-13 DIAGNOSIS — M6281 Muscle weakness (generalized): Secondary | ICD-10-CM | POA: Diagnosis not present

## 2015-11-13 DIAGNOSIS — R262 Difficulty in walking, not elsewhere classified: Secondary | ICD-10-CM | POA: Diagnosis not present

## 2015-11-13 DIAGNOSIS — M5137 Other intervertebral disc degeneration, lumbosacral region: Secondary | ICD-10-CM | POA: Diagnosis not present

## 2015-11-21 ENCOUNTER — Other Ambulatory Visit: Payer: Self-pay | Admitting: Internal Medicine

## 2015-12-01 DIAGNOSIS — M5137 Other intervertebral disc degeneration, lumbosacral region: Secondary | ICD-10-CM | POA: Diagnosis not present

## 2015-12-01 DIAGNOSIS — Z6837 Body mass index (BMI) 37.0-37.9, adult: Secondary | ICD-10-CM | POA: Diagnosis not present

## 2015-12-01 DIAGNOSIS — I10 Essential (primary) hypertension: Secondary | ICD-10-CM | POA: Diagnosis not present

## 2015-12-22 ENCOUNTER — Ambulatory Visit (INDEPENDENT_AMBULATORY_CARE_PROVIDER_SITE_OTHER): Payer: Medicare Other | Admitting: Internal Medicine

## 2015-12-22 ENCOUNTER — Encounter: Payer: Self-pay | Admitting: Internal Medicine

## 2015-12-22 VITALS — BP 120/80 | HR 63 | Temp 97.7°F | Resp 16 | Ht 67.5 in | Wt 191.0 lb

## 2015-12-22 DIAGNOSIS — J310 Chronic rhinitis: Secondary | ICD-10-CM

## 2015-12-22 DIAGNOSIS — N182 Chronic kidney disease, stage 2 (mild): Secondary | ICD-10-CM

## 2015-12-22 DIAGNOSIS — G44029 Chronic cluster headache, not intractable: Secondary | ICD-10-CM | POA: Diagnosis not present

## 2015-12-22 DIAGNOSIS — Z0001 Encounter for general adult medical examination with abnormal findings: Secondary | ICD-10-CM | POA: Diagnosis not present

## 2015-12-22 DIAGNOSIS — N320 Bladder-neck obstruction: Secondary | ICD-10-CM

## 2015-12-22 DIAGNOSIS — E559 Vitamin D deficiency, unspecified: Secondary | ICD-10-CM

## 2015-12-22 DIAGNOSIS — K279 Peptic ulcer, site unspecified, unspecified as acute or chronic, without hemorrhage or perforation: Secondary | ICD-10-CM

## 2015-12-22 DIAGNOSIS — E1122 Type 2 diabetes mellitus with diabetic chronic kidney disease: Secondary | ICD-10-CM | POA: Diagnosis not present

## 2015-12-22 DIAGNOSIS — Z0000 Encounter for general adult medical examination without abnormal findings: Secondary | ICD-10-CM

## 2015-12-22 DIAGNOSIS — E782 Mixed hyperlipidemia: Secondary | ICD-10-CM | POA: Diagnosis not present

## 2015-12-22 DIAGNOSIS — G4733 Obstructive sleep apnea (adult) (pediatric): Secondary | ICD-10-CM

## 2015-12-22 DIAGNOSIS — G8929 Other chronic pain: Secondary | ICD-10-CM

## 2015-12-22 DIAGNOSIS — Z91018 Allergy to other foods: Secondary | ICD-10-CM

## 2015-12-22 DIAGNOSIS — Z6829 Body mass index (BMI) 29.0-29.9, adult: Secondary | ICD-10-CM

## 2015-12-22 DIAGNOSIS — G4710 Hypersomnia, unspecified: Secondary | ICD-10-CM

## 2015-12-22 DIAGNOSIS — Z79899 Other long term (current) drug therapy: Secondary | ICD-10-CM

## 2015-12-22 DIAGNOSIS — K219 Gastro-esophageal reflux disease without esophagitis: Secondary | ICD-10-CM | POA: Diagnosis not present

## 2015-12-22 DIAGNOSIS — I10 Essential (primary) hypertension: Secondary | ICD-10-CM

## 2015-12-22 DIAGNOSIS — M545 Low back pain, unspecified: Secondary | ICD-10-CM

## 2015-12-22 DIAGNOSIS — J449 Chronic obstructive pulmonary disease, unspecified: Secondary | ICD-10-CM

## 2015-12-22 DIAGNOSIS — R6889 Other general symptoms and signs: Secondary | ICD-10-CM | POA: Diagnosis not present

## 2015-12-22 DIAGNOSIS — G4730 Sleep apnea, unspecified: Secondary | ICD-10-CM

## 2015-12-22 DIAGNOSIS — I25700 Atherosclerosis of coronary artery bypass graft(s), unspecified, with unstable angina pectoris: Secondary | ICD-10-CM

## 2015-12-22 LAB — CBC WITH DIFFERENTIAL/PLATELET
BASOS ABS: 60 {cells}/uL (ref 0–200)
BASOS PCT: 1 %
EOS ABS: 180 {cells}/uL (ref 15–500)
Eosinophils Relative: 3 %
HCT: 46.8 % (ref 38.5–50.0)
Hemoglobin: 16.4 g/dL (ref 13.2–17.1)
LYMPHS PCT: 30 %
Lymphs Abs: 1800 cells/uL (ref 850–3900)
MCH: 31.4 pg (ref 27.0–33.0)
MCHC: 35 g/dL (ref 32.0–36.0)
MCV: 89.5 fL (ref 80.0–100.0)
MONOS PCT: 8 %
MPV: 9.9 fL (ref 7.5–12.5)
Monocytes Absolute: 480 cells/uL (ref 200–950)
Neutro Abs: 3480 cells/uL (ref 1500–7800)
Neutrophils Relative %: 58 %
PLATELETS: 178 10*3/uL (ref 140–400)
RBC: 5.23 MIL/uL (ref 4.20–5.80)
RDW: 14.5 % (ref 11.0–15.0)
WBC: 6 10*3/uL (ref 3.8–10.8)

## 2015-12-22 LAB — HEMOGLOBIN A1C
Hgb A1c MFr Bld: 5.4 % (ref <5.7)
Mean Plasma Glucose: 108 mg/dL

## 2015-12-22 LAB — TSH: TSH: 1.17 mIU/L (ref 0.40–4.50)

## 2015-12-22 NOTE — Progress Notes (Signed)
MEDICARE ANNUAL WELLNESS VISIT AND FOLLOW UP Assessment:    1. Essential hypertension -well controlled -cont meds -monitor at home - TSH  2. 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 COPD mixed type ([place]) -cont inhalers prn  4. Rhinitis, nonallergic, chronic -cont ipatropium  5. OSA and COPD overlap syndrome (HCC) -cont CPAP  6. Gastroesophageal reflux disease, esophagitis presence not specified -cont meds -avoid overeating -elevate HOB  7. PUD -cont meds  8. Chronic cluster headache, not intractable -cont plavix   9. CKD stage 2 due to type 2 diabetes mellitus (HCC) -cont meds -diet and exercise as tolerated - Hemoglobin A1c  10. BPH/Prostatism -cont meds  11. Hyperlipidemia -cont statin - Lipid panel  12. Morbid obesity due to excess calories (HCC) -cont diet and exercise  13. Chronic low back pain without sciatica, unspecified back pain laterality -followed by ortho  14. Vitamin D deficiency -cont Vit D supplement  15. Medication management  - CBC with Differential/Platelet - BASIC METABOLIC PANEL WITH GFR - Hepatic function panel  16. Food allergy -avoid trigger foods  17. Hypersomnia with sleep apnea -cont CPAP  18. BMI 29.0-29.9,adult -cont diet and exercise as tolerated -keep food log and send to [name].    19. Medicare annual wellness visit, subsequent -due next year     Over 30 minutes of exam, counseling, chart review, and critical decision making was performed  Future Appointments Date Time Provider [place]  04/04/2016 9:00 AM [name], MD [facility] None     Plan:   During the course of the visit the patient was educated and counseled about appropriate screening and preventive services including:    Pneumococcal vaccine   Influenza vaccine  Prevnar 13  Td vaccine  Screening  electrocardiogram  Colorectal cancer screening  Diabetes screening  Glaucoma screening  Nutrition counseling    Subjective:  [name] is a 70 y.o. male who presents for Medicare Annual Wellness Visit and 3 month follow up for HTN, hyperlipidemia, prediabetes, and vitamin D Def.   His blood pressure has been controlled at home, today their BP is BP: 120/80 mmHg He does workout. He denies chest pain, shortness of breath, dizziness.   He is on cholesterol medication and denies myalgias. His cholesterol is at goal. The cholesterol last visit was:   Lab Results  Component Value Date   CHOL 174 09/14/2015   HDL 35* 09/14/2015   LDLCALC 90 09/14/2015   LDLDIRECT 116.0 06/03/2013   TRIG 243* 09/14/2015   CHOLHDL 5.0 09/14/2015      Lab Results  Component Value Date   HGBA1C 5.6 09/14/2015   Last GFR Lab Results  Component Value Date   GFRNONAA 75 09/14/2015     Lab Results  Component Value Date   GFRAA 87 09/14/2015   Patient is on Vitamin D supplement.   Lab Results  Component Value Date   VD25OH 37 03/11/2015     He reports that his back has been doing better. He did have his back injected and he reports that he has been able to be more active.   He is concerned about his weight as he is exercising and has cut down on his diet signficantly.  He reports that he is unsure why he still has so much "belly fat".     Medication Review: Current Outpatient Prescriptions on File Prior to Visit  Medication Sig Dispense Refill  .  Ascorbic Acid (VITAMIN C) 1000 MG tablet Take 1,000 mg by mouth daily.    Marland Kitchen aspirin EC 81 MG tablet Take 81 mg by mouth daily.    . B Complex Vitamins (B COMPLEX PO) Take by mouth daily.    . bisoprolol-hydrochlorothiazide (ZIAC) 2.5-6.25 MG tablet TAKE 1 TABLET BY MOUTH DAILY FOR HIGH BLOOD PRESSURE AND HEART 90 tablet 2  . Choline Dihydrogen Citrate (CHOLINE CITRATE) 650 MG TABS Take by mouth.    . clobetasol (TEMOVATE) 0.05 % external  solution Apply 1 application topically daily. 50 mL 0  . clopidogrel (PLAVIX) 75 MG tablet TAKE 1 TABLET (75 MG TOTAL) BY MOUTH DAILY. 90 tablet 0  . Coenzyme Q10 (CO Q 10) 100 MG CAPS Take 100 mg by mouth daily.     . fish oil-omega-3 fatty acids 1000 MG capsule Take 2 g by mouth daily.     . Flaxseed, Linseed, (FLAX SEED OIL) 1000 MG CAPS Take 1,000 mg by mouth 2 (two) times daily.     . fluocinonide (LIDEX) 0.05 % external solution     . folic acid (FOLVITE) 353 MCG tablet Take 800 mcg by mouth daily.    Marland Kitchen ipratropium (ATROVENT) 0.03 % nasal spray 1-2 puffs each nostril every 6 hours if needed, before meals 30 mL 12  . ketoconazole (NIZORAL) 2 % shampoo Apply 1 application topically 2 (two) times a week. 120 mL 3  . Magnesium 500 MG TABS Take 500 mg by mouth 2 (two) times daily.    Marland Kitchen MELATONIN PO Take 30 mg by mouth daily.    . Multiple Vitamin (MULTIVITAMIN) capsule Take 1 capsule by mouth daily.      . niacin 500 MG tablet Take 500 mg by mouth 2 (two) times daily.    . nitroGLYCERIN (NITROSTAT) 0.4 MG SL tablet DISSOLVE 1 TABLET UNDER TONGUE EVERY 3 TO 5 MINUTES FOR ANGINA 225 tablet 98  . OVER THE COUNTER MEDICATION Takes ALA(alpha lipoic)    . OVER THE COUNTER MEDICATION Takes lecithin 1200 mg 2 times daily.    Marland Kitchen OVER THE COUNTER MEDICATION Valerian 400 mg 3 tabs at night.    . Turmeric 500 MG CAPS Take 500 mg by mouth daily.      No current facility-administered medications on file prior to visit.    Allergies: Allergies  Allergen Reactions  . Codeine Itching and Other (See Comments)    Also hallucinations   . Morphine And Related Other (See Comments)    Hallucinations  . Nsaids Other (See Comments)    BLEEDING  . Crestor [Rosuvastatin]   . Cymbalta [Duloxetine Hcl]   . Dilaudid [Hydromorphone Hcl]   . Effexor [Venlafaxine]   . Gluten Meal   . Percocet [Oxycodone-Acetaminophen]   . Sulfa Antibiotics   . Topamax [Topiramate]   . Trazodone And Nefazodone   . Adhesive  [Tape] Other (See Comments)    SKIN PEELS  . Penicillins Rash    Current Problems (verified) has CKD stage 2 due to type 2 diabetes mellitus ([place]); Hyperlipidemia; Morbid obesity (BMI 29.22); Cluster headache syndrome; Essential hypertension; Coronary atherosclerosis; COPD mixed type ([place]); GERD; Lumbago, Chronic; PUD; BPH/Prostatism; Vitamin D deficiency; Medication management; Food allergy; Rhinitis, nonallergic, chronic; Hypersomnia with sleep apnea; BMI 29.0-29.9,adult; Medicare annual wellness visit, subsequent; and OSA and COPD overlap syndrome ([place]) on his problem list.  Screening Tests Immunization History  Administered Date(s) Administered  . DT 12/09/2014  . Influenza Split 02/17/2012  . Influenza, High Dose Seasonal PF  03/03/2014, 03/11/2015  . Pneumococcal Conjugate-13 12/25/2014  . Pneumococcal Polysaccharide-23 01/19/2012  . Pneumococcal-Unspecified 06/13/1997  . Td 06/13/2004  . Zoster 06/14/2007    Preventative care: Last colonoscopy: 2002, will order colonoscopy  Prior vaccinations: TD or Tdap: 6/16 Influenza: 9/16  Pneumococcal: 8/13 Prevnar13: 1/99 Shingles/Zostavax: 2009  Names of Other Physician/Practitioners you currently use: 1.  [facility] here for primary care 2. Dr. Katy Fitch, eye doctor, last visit 01/22/15 3. Dr. Rodney Booze, dentist, last visit 2017 Patient Care Team: [name], MD as PCP - General (Internal Medicine) Kathie Rhodes, MD as Consulting Physician (Urology) Warden Fillers, MD as Consulting Physician (Optometry) Larey Dresser, MD as Consulting Physician (Cardiology) Laurence Spates, MD as Consulting Physician (Gastroenterology) Deneise Lever, MD as Consulting Physician (Pulmonary Disease)  Surgical: He  has past surgical history that includes Hiatal hernia repair; Lumbar spine surgery; right temporal artery biopsy; C5-T6 posterior fusion; right carpal tunnel release; Esophageal manometry;  Laparoscopic cholecystectomy; Umbilical hernia repair; multiple percutaneous coronary interventions; Back surgery; cataracts; blepheroplasty; Eye surgery; Nasal sinus surgery; Cardiac catheterization; and Hardware Removal (02/20/2012). Family His family history includes Coronary artery disease in his father; Gout in his sister; Heart attack in his brother and brother; Heart failure in his mother. Social history  He reports that he quit smoking about 37 years ago. He does not have any smokeless tobacco history on file. He reports that he drinks alcohol. He reports that he does not use illicit drugs.  MEDICARE WELLNESS OBJECTIVES: Physical activity: Current Exercise Habits: Home exercise routine, Time (Minutes): 30, Frequency (Times/Week): 7, Weekly Exercise (Minutes/Week): 210, Intensity: Moderate Cardiac risk factors: Cardiac Risk Factors include: advanced age (>68mn, >>55women);diabetes mellitus;dyslipidemia;family history of premature cardiovascular disease;hypertension;male gender Depression/mood screen:   [facility] 12/22/2015  Decreased Interest 0  Down, Depressed, Hopeless 0  PHQ - 2 Score 0    ADLs:  In your present state of health, do you have any difficulty performing the following activities: 12/22/2015 03/11/2015  Hearing? N N  Vision? N N  Difficulty concentrating or making decisions? N N  Walking or climbing stairs? N N  Dressing or bathing? N N  Doing errands, shopping? N N  Preparing Food and eating ? N -  Using the Toilet? N -  In the past six months, have you accidently leaked urine? N -  Do you have problems with loss of bowel control? N -  Managing your Medications? N -  Managing your Finances? N -  Housekeeping or managing your Housekeeping? N -     Cognitive Testing  Alert? Yes  Normal Appearance?Yes  Oriented to person? Yes  Place? Yes   Time? Yes  Recall of three objects?  Yes  Can perform simple calculations? Yes  Displays appropriate  judgment?Yes  Can read the correct time from a watch face?Yes  EOL planning: Does patient have an advance directive?: Yes Type of Advance Directive: Healthcare Power of Attorney, Living will Does patient want to make changes to advanced directive?: No - Patient declined Copy of advanced directive(s) in chart?: Yes   Objective:   Today's Vitals   12/22/15 1030  BP: 120/80  Pulse: 63  Temp: 97.7 F (36.5 C)  TempSrc: Temporal  Resp: 16  Height: 5' 7.5" (1.715 m)  Weight: 191 lb (86.637 kg)  SpO2: 96%   Body mass index is 29.46 kg/(m^2).  General appearance: alert, no distress, WD/WN, male HEENT: normocephalic, sclerae anicteric, TMs pearly, nares patent, no discharge or  erythema, pharynx normal Oral cavity: MMM, no lesions Neck: supple, no lymphadenopathy, no thyromegaly, no masses Heart: RRR, normal S1, S2, no murmurs Lungs: CTA bilaterally, no wheezes, rhonchi, or rales Abdomen: +bs, soft, non tender, non distended, no masses, no hepatomegaly, no splenomegaly Musculoskeletal: nontender, no swelling, no obvious deformity Extremities: no edema, no cyanosis, no clubbing Pulses: 2+ symmetric, upper and lower extremities, normal cap refill Neurological: alert, oriented x 3, CN2-12 intact, strength normal upper extremities and lower extremities, sensation normal throughout, DTRs 2+ throughout, no cerebellar signs, gait normal Psychiatric: normal affect, behavior normal, pleasant   Medicare Attestation I have personally reviewed: The patient's medical and social history Their use of alcohol, tobacco or illicit drugs Their current medications and supplements The patient's functional ability including ADLs,fall risks, home safety risks, cognitive, and hearing and visual impairment Diet and physical activities Evidence for depression or mood disorders  The patient's weight, height, BMI, and visual acuity have been recorded in the chart.  I have made referrals, counseling, and  provided education to the patient based on review of the above and I have provided the patient with a written personalized care plan for preventive services.     Starlyn Skeans, PA-C   12/22/2015

## 2015-12-23 LAB — HEPATIC FUNCTION PANEL
ALBUMIN: 4 g/dL (ref 3.6–5.1)
ALT: 22 U/L (ref 9–46)
AST: 32 U/L (ref 10–35)
Alkaline Phosphatase: 61 U/L (ref 40–115)
BILIRUBIN TOTAL: 0.6 mg/dL (ref 0.2–1.2)
Bilirubin, Direct: 0.1 mg/dL (ref <=0.2)
Indirect Bilirubin: 0.5 mg/dL (ref 0.2–1.2)
Total Protein: 6.9 g/dL (ref 6.1–8.1)

## 2015-12-23 LAB — BASIC METABOLIC PANEL WITH GFR
BUN: 16 mg/dL (ref 7–25)
CALCIUM: 9.2 mg/dL (ref 8.6–10.3)
CO2: 19 mmol/L — ABNORMAL LOW (ref 20–31)
CREATININE: 0.95 mg/dL (ref 0.70–1.18)
Chloride: 97 mmol/L — ABNORMAL LOW (ref 98–110)
GFR, Est Non African American: 80 mL/min (ref >=60)
Glucose, Bld: 79 mg/dL (ref 65–99)
Potassium: 3.9 mmol/L (ref 3.5–5.3)
Sodium: 133 mmol/L — ABNORMAL LOW (ref 135–146)

## 2015-12-23 LAB — LIPID PANEL
CHOL/HDL RATIO: 4.4 ratio (ref <=5.0)
CHOLESTEROL: 182 mg/dL (ref 125–200)
HDL: 41 mg/dL (ref >=40)
LDL Cholesterol: 79 mg/dL (ref <130)
Triglycerides: 308 mg/dL — ABNORMAL HIGH (ref <150)
VLDL: 62 mg/dL — ABNORMAL HIGH (ref <30)

## 2015-12-31 DIAGNOSIS — L820 Inflamed seborrheic keratosis: Secondary | ICD-10-CM | POA: Diagnosis not present

## 2016-01-06 ENCOUNTER — Telehealth: Payer: Self-pay | Admitting: Internal Medicine

## 2016-01-06 MED ORDER — IPRATROPIUM BROMIDE 0.03 % NA SOLN
NASAL | 12 refills | Status: DC
Start: 2016-01-06 — End: 2016-08-15

## 2016-01-06 NOTE — Telephone Encounter (Signed)
Spoke with pharmacist @ [name]. Pt called them to request refill on Atrovent nasal spray and they have never filled. Per chart was previously sent to [facility]. Rx sent to [facility]. Nothing further needed.

## 2016-01-11 DIAGNOSIS — M25511 Pain in right shoulder: Secondary | ICD-10-CM | POA: Diagnosis not present

## 2016-01-13 ENCOUNTER — Other Ambulatory Visit: Payer: Self-pay | Admitting: Physician Assistant

## 2016-01-13 DIAGNOSIS — M25511 Pain in right shoulder: Secondary | ICD-10-CM

## 2016-01-15 DIAGNOSIS — R600 Localized edema: Secondary | ICD-10-CM | POA: Diagnosis not present

## 2016-01-15 DIAGNOSIS — I8311 Varicose veins of right lower extremity with inflammation: Secondary | ICD-10-CM | POA: Diagnosis not present

## 2016-01-15 DIAGNOSIS — I8312 Varicose veins of left lower extremity with inflammation: Secondary | ICD-10-CM | POA: Diagnosis not present

## 2016-01-20 ENCOUNTER — Ambulatory Visit
Admission: RE | Admit: 2016-01-20 | Discharge: 2016-01-20 | Disposition: A | Discharge status: Home / Self Care | Payer: Medicare Other | Source: Ambulatory Visit | Attending: Physician Assistant | Admitting: Physician Assistant

## 2016-01-20 DIAGNOSIS — M25511 Pain in right shoulder: Secondary | ICD-10-CM | POA: Diagnosis not present

## 2016-02-01 DIAGNOSIS — M75121 Complete rotator cuff tear or rupture of right shoulder, not specified as traumatic: Secondary | ICD-10-CM | POA: Diagnosis not present

## 2016-02-10 DIAGNOSIS — H31001 Unspecified chorioretinal scars, right eye: Secondary | ICD-10-CM | POA: Diagnosis not present

## 2016-02-10 DIAGNOSIS — Z961 Presence of intraocular lens: Secondary | ICD-10-CM | POA: Diagnosis not present

## 2016-02-10 DIAGNOSIS — H26492 Other secondary cataract, left eye: Secondary | ICD-10-CM | POA: Diagnosis not present

## 2016-02-10 DIAGNOSIS — H33311 Horseshoe tear of retina without detachment, right eye: Secondary | ICD-10-CM | POA: Diagnosis not present

## 2016-02-10 DIAGNOSIS — E119 Type 2 diabetes mellitus without complications: Secondary | ICD-10-CM | POA: Diagnosis not present

## 2016-02-11 DIAGNOSIS — M25511 Pain in right shoulder: Secondary | ICD-10-CM | POA: Diagnosis not present

## 2016-02-11 DIAGNOSIS — M659 Synovitis and tenosynovitis, unspecified: Secondary | ICD-10-CM | POA: Diagnosis not present

## 2016-02-11 DIAGNOSIS — M75121 Complete rotator cuff tear or rupture of right shoulder, not specified as traumatic: Secondary | ICD-10-CM | POA: Diagnosis not present

## 2016-02-11 DIAGNOSIS — G8918 Other acute postprocedural pain: Secondary | ICD-10-CM | POA: Diagnosis not present

## 2016-02-17 ENCOUNTER — Ambulatory Visit (INDEPENDENT_AMBULATORY_CARE_PROVIDER_SITE_OTHER): Payer: Medicare Other | Admitting: Internal Medicine

## 2016-02-17 ENCOUNTER — Encounter: Payer: Self-pay | Admitting: Internal Medicine

## 2016-02-17 VITALS — BP 132/76 | HR 64 | Temp 97.5°F | Resp 16 | Ht 67.0 in | Wt 195.0 lb

## 2016-02-17 DIAGNOSIS — B3749 Other urogenital candidiasis: Secondary | ICD-10-CM

## 2016-02-17 DIAGNOSIS — L259 Unspecified contact dermatitis, unspecified cause: Secondary | ICD-10-CM

## 2016-02-17 DIAGNOSIS — I25700 Atherosclerosis of coronary artery bypass graft(s), unspecified, with unstable angina pectoris: Secondary | ICD-10-CM | POA: Diagnosis not present

## 2016-02-17 MED ORDER — FLUCONAZOLE 150 MG PO TABS: ORAL_TABLET | ORAL | 3 refills | Status: DC

## 2016-02-17 MED ORDER — PREDNISONE 20 MG PO TABS: ORAL_TABLET | ORAL | 0 refills | Status: DC

## 2016-02-17 NOTE — Patient Instructions (Signed)
Contact Dermatitis Dermatitis is redness, soreness, and swelling (inflammation) of the skin. Contact dermatitis is a reaction to certain substances that touch the skin. There are two types of contact dermatitis:   Irritant contact dermatitis. This type is caused by something that irritates your skin, such as dry hands from washing them too much. This type does not require previous exposure to the substance for a reaction to occur. This type is more common.  Allergic contact dermatitis. This type is caused by a substance that you are allergic to, such as a nickel allergy or poison ivy. This type only occurs if you have been exposed to the substance (allergen) before. Upon a repeat exposure, your body reacts to the substance. This type is less common. CAUSES  Many different substances can cause contact dermatitis. Irritant contact dermatitis is most commonly caused by exposure to:   Makeup.   Soaps.   Detergents.   Bleaches.   Acids.   Metal salts, such as nickel.  Allergic contact dermatitis is most commonly caused by exposure to:   Poisonous plants.   Chemicals.   Jewelry.   Latex.   Medicines.   Preservatives in products, such as clothing.  RISK FACTORS This condition is more likely to develop in:   People who have jobs that expose them to irritants or allergens.  People who have certain medical conditions, such as asthma or eczema.  SYMPTOMS  Symptoms of this condition may occur anywhere on your body where the irritant has touched you or is touched by you. Symptoms include:  Dryness or flaking.   Redness.   Cracks.   Itching.   Pain or a burning feeling.   Blisters.  Drainage of small amounts of blood or clear fluid from skin cracks. With allergic contact dermatitis, there may also be swelling in areas such as the eyelids, mouth, or genitals.  DIAGNOSIS  This condition is diagnosed with a medical history and physical exam. A patch skin test  may be performed to help determine the cause. If the condition is related to your job, you may need to see an occupational medicine specialist. TREATMENT Treatment for this condition includes figuring out what caused the reaction and protecting your skin from further contact. Treatment may also include:   Steroid creams or ointments. Oral steroid medicines may be needed in more severe cases.  Antibiotics or antibacterial ointments, if a skin infection is present.  Antihistamine lotion or an antihistamine taken by mouth to ease itching.  A bandage (dressing). HOME CARE INSTRUCTIONS Skin Care  Moisturize your skin as needed.   Apply cool compresses to the affected areas.  Try taking a bath with:  Epsom salts. Follow the instructions on the packaging. You can get these at your local pharmacy or grocery store.  Baking soda. Pour a small amount into the bath as directed by your health care provider.  Colloidal oatmeal. Follow the instructions on the packaging. You can get this at your local pharmacy or grocery store.  Try applying baking soda paste to your skin. Stir water into baking soda until it reaches a paste-like consistency.  Do not scratch your skin.  Bathe less frequently, such as every other day.  Bathe in lukewarm water. Avoid using hot water. Medicines  Take or apply over-the-counter and prescription medicines only as told by your health care provider.   If you were prescribed an antibiotic medicine, take or apply your antibiotic as told by your health care provider. Do not stop using the   antibiotic even if your condition starts to improve. General Instructions  Keep all follow-up visits as told by your health care provider. This is important.  Avoid the substance that caused your reaction. If you do not know what caused it, keep a journal to try to track what caused it. Write down:  What you eat.  What cosmetic products you use.  What you drink.  What  you wear in the affected area. This includes jewelry.  If you were given a dressing, take care of it as told by your health care provider. This includes when to change and remove it. SEEK MEDICAL CARE IF:   Your condition does not improve with treatment.  Your condition gets worse.  You have signs of infection such as swelling, tenderness, redness, soreness, or warmth in the affected area.  You have a fever.  You have new symptoms. SEEK IMMEDIATE MEDICAL CARE IF:   You have a severe headache, neck pain, or neck stiffness.  You vomit.  You feel very sleepy.  You notice red streaks coming from the affected area.  Your bone or joint underneath the affected area becomes painful after the skin has healed.  The affected area turns darker.  You have difficulty breathing.  +++++++++++++++++++++++++ Candida Infection, Adult    A Candida infection (also called yeast, fungus, and Monilia infection) is an overgrowth of yeast that can occur anywhere on the body. A yeast infection commonly occurs in warm, moist body areas. Usually, the infection remains localized but can spread to become a systemic infection.      Men usually do not have symptoms or know they have an infection until other problems develop. Men may find out they have a yeast infection because their sex partner has a yeast infection. Uncircumcised men are more likely to get a yeast infection than circumcised men. This is because the uncircumcised glans is not exposed to air and does not remain as dry as that of a circumcised glans. Older adults may develop yeast infections around dentures.  CAUSES   Men  Catching it from a sex partner who has a yeast infection.  Having oral or anal sex with a person who has the infection.  Spermicide.  Diabetes.  Antibiotics.  Poor immune system.  Medications that suppress the immune system.  Intravenous drug use.  Intravenous, urinary, or other catheters. SYMPTOMS      Men  Men may develop intestinal problems such as constipation, indigestion, bad breath, bloating, increase in gas, diarrhea, or loose stools.  Dry, cracked skin on the penis with itching or discomfort.  Jock itch.  Dry, flaky skin.  Athlete's foot.  Hypoglycemia. DIAGNOSIS     A history and an exam are performed.  Any discharge from the penis or areas of cracked skin will be looked at under the microscope and cultured.  Stool samples may be cultured. TREATMENT  Women  Vaginal antifungal suppositories and creams.  Medicated creams to decrease irritation and itching on the outside of the vagina.  Warm compresses to the perineal area to decrease swelling and discomfort.  Oral antifungal medications.  Medicated vaginal suppositories or cream for repeated or recurrent infections.  Wash and dry the irritation areas before applying the cream.  Eating yogurt with Lactobacillus may help with prevention and treatment.  Sometimes painting the vagina with gentian violet solution may help if creams and suppositories do not work. Men  Antifungal creams and oral antifungal medications.  Sometimes treatment must continue for 30 days after  the symptoms go away to prevent recurrence. HOME CARE INSTRUCTIONS  Women  Use cotton underwear and avoid tight-fitting clothing.  Avoid colored, scented toilet paper and deodorant tampons or pads.  Do not douche.  Keep your diabetes under control.  Finish all the prescribed medications.  Keep your skin clean and dry.  Consume milk or yogurt with Lactobacillus-active culture regularly. If you get frequent yeast infections and think that is what the infection is, there are over-the-counter medications that you can get. If the infection does not show healing in 3 days, talk to your caregiver.  Tell your sex partner you have a yeast infection. Your partner may need treatment also, especially if your infection does not clear up or  recurs. Men  Keep your skin clean and dry.  Keep your diabetes under control.  Finish all prescribed medications.  Tell your sex partner that you have a yeast infection so he or she can be treated if necessary. SEEK MEDICAL CARE IF:   Your symptoms do not clear up or worsen in one week after treatment.  You have an oral temperature above 102 F (38.9 C).  You have trouble swallowing or eating for a prolonged time.  You develop blisters on and around your vagina.  You develop vaginal bleeding and it is not your menstrual period.  You develop abdominal pain.  You develop intestinal problems as mentioned above.  You get weak or light-headed.  You have painful or increased urination.  You have pain during sexual intercourse. MAKE SURE YOU:   Understand these instructions.  Will watch your condition.  Will get help right away if you are not doing well or get worse.

## 2016-02-17 NOTE — Progress Notes (Signed)
Subjective:    Patient ID: [name], male    DOB: 01/25/1946, 70 y.o.   MRN: [id number]  HPI  Patient had Rt shoulder surg on 8.31.2017 and since has developed a rash over the area. He also is c/o about a recurrent rash in the cruro-scrotal fold and at the thigh scrotal fold or crease .   Outpatient Medications Prior to Visit  Medication Sig Dispense Refill  . Ascorbic Acid (VITAMIN C) 1000 MG tablet Take 1,000 mg by mouth daily.    Marland Kitchen aspirin EC 81 MG tablet Take 81 mg by mouth daily.    . B Complex Vitamins (B COMPLEX PO) Take by mouth daily.    . bisoprolol-hydrochlorothiazide (ZIAC) 2.5-6.25 MG tablet TAKE 1 TABLET BY MOUTH DAILY FOR HIGH BLOOD PRESSURE AND HEART 90 tablet 2  . Choline Dihydrogen Citrate (CHOLINE CITRATE) 650 MG TABS Take by mouth.    . clobetasol (TEMOVATE) 0.05 % external solution Apply 1 application topically daily. 50 mL 0  . clopidogrel (PLAVIX) 75 MG tablet TAKE 1 TABLET (75 MG TOTAL) BY MOUTH DAILY. 90 tablet 0  . Coenzyme Q10 (CO Q 10) 100 MG CAPS Take 100 mg by mouth daily.     . fish oil-omega-3 fatty acids 1000 MG capsule Take 2 g by mouth daily.     . Flaxseed, Linseed, (FLAX SEED OIL) 1000 MG CAPS Take 1,000 mg by mouth 2 (two) times daily.     . fluocinonide (LIDEX) 0.05 % external solution     . folic acid (FOLVITE) 732 MCG tablet Take 800 mcg by mouth daily.    Marland Kitchen ipratropium (ATROVENT) 0.03 % nasal spray 1-2 puffs each nostril every 6 hours if needed, before meals 30 mL 12  . ketoconazole (NIZORAL) 2 % shampoo Apply 1 application topically 2 (two) times a week. 120 mL 3  . Magnesium 500 MG TABS Take 500 mg by mouth 2 (two) times daily.    Marland Kitchen MELATONIN PO Take 30 mg by mouth daily.    . Multiple Vitamin (MULTIVITAMIN) capsule Take 1 capsule by mouth daily.      . niacin 500 MG tablet Take 500 mg by mouth 2 (two) times daily.    . nitroGLYCERIN (NITROSTAT) 0.4 MG SL tablet DISSOLVE 1 TABLET UNDER TONGUE EVERY 3 TO 5 MINUTES FOR ANGINA 225 tablet 98   . OVER THE COUNTER MEDICATION Takes ALA(alpha lipoic)    . OVER THE COUNTER MEDICATION Takes lecithin 1200 mg 2 times daily.    Marland Kitchen OVER THE COUNTER MEDICATION Valerian 400 mg 3 tabs at night.    . Turmeric 500 MG CAPS Take 500 mg by mouth 4 (four) times daily.      No facility-administered medications prior to visit.    Allergies  Allergen Reactions  . Codeine Itching and Other (See Comments)    Also hallucinations   . Morphine And Related Other (See Comments)    Hallucinations  . Nsaids Other (See Comments)    BLEEDING  . Crestor [Rosuvastatin]   . Cymbalta [Duloxetine Hcl]   . Dilaudid [Hydromorphone Hcl]   . Effexor [Venlafaxine]   . Gluten Meal   . Percocet [Oxycodone-Acetaminophen]   . Sulfa Antibiotics   . Topamax [Topiramate]   . Trazodone And Nefazodone   . Adhesive [Tape] Other (See Comments)    SKIN PEELS  . Penicillins Rash   Past Medical History:  Diagnosis Date  . Anxiety    treated /w Xanax for mild depression , using  2 times per day, not PRN  . Asthma   . Benign prostatic hypertrophy   . CAD (coronary artery disease)    pt last left heart cath was in Nov 2008. EF was 55% on left ventriculogram. Circumflex was totally occluded after the 1st obtuse marginal with collaterals supplying the distal circumflex. LAD showed luminal irregularities. The stent in LAD was patent. The RCA showed luminal irregularities. The stent in th emid RCA and PDA were patent. There were no inverventions.   . Cancer ([place])    basal cell- face & head  . Chronic obstructive pulmonary disease (HCC)    asthma  . Cluster headache    relative to sinus problems   . Cluster headache   . Depression   . DM2 (diabetes mellitus, type 2) ([place])    well controlled  . GERD (gastroesophageal reflux disease)    hiatal hernia. Patient did hav ea Nissen fundoplication  . History of blood transfusion    need for Bld. transfusion relative to taking NSAIDS  . HTN (hypertension)    pt. followed by  [facility], last cardiac visit 2012, one yr. ago  . Low back pain    chronic  . 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 Obesity   . Osteoarthritis   . Peptic ulcer disease   . Sleep concern    states the study (2003 )was done at [facility] it failed & he was told to return & he never did. Pt. states he has been found by prev. hosp. staff that he has to be told when to breathe & his wife does the same.   Marland Kitchen Spinal fracture    hx of traumatic in Jan 2009 after falling off the roof   Past Surgical History:  Procedure Laterality Date  . BACK SURGERY     x3- last surgery lumbar- 1998- / fusion   . blepheroplasty     both eyes  . C5-T6 posterior fusion     with Oasis and radius screws  . CARDIAC CATHETERIZATION     2008 & 2012 multiple stents    . cataracts     cataracts removed- /w IOL - both eyes   . ESOPHAGEAL MANOMETRY    . EYE SURGERY    . HARDWARE REMOVAL  02/20/2012   Procedure: HARDWARE REMOVAL;  Surgeon: [name], MD;  Location: [facility];  Service: Neurosurgery;  Laterality: N/A;  Hardware Removal  . HIATAL HERNIA REPAIR    . LAPAROSCOPIC CHOLECYSTECTOMY     & IOC  . LUMBAR SPINE SURGERY     x4  . multiple percutaneous coronary interventions    . NASAL SINUS SURGERY     x2, sees Dr. [name], still having problems, states he uses Benadryl PRN- up to 5 times per day   . right carpal tunnel release    . right temporal artery biopsy    . UMBILICAL HERNIA REPAIR     Review of Systems  10 point systems review negative except as above.    Objective:   Physical Exam  BP 132/76   Pulse 64   Temp 97.5 F (36.4 C)   Resp 16   Ht '5\' 7"'$  (1.702 m)   Wt 195 lb (88.5 kg)   BMI 30.54 kg/m   HEENT - Eac's patent. TM's Nl. EOM's full. PERRLA. NasoOroPharynx clear. Neck - supple. Nl Thyroid. Carotids 2+ & No  bruits, nodes, JVD Chest - Clear equal BS w/o Rales, rhonchi, wheezes. Cor - Nl HS. RRR w/o sig  MGR. PP 1(+). No edema. Abd - No palpable organomegaly, masses or tenderness. BS nl. MS- FROM w/o deformities. Muscle power, tone and bulk Nl. Gait Nl. Neuro - No obvious Cr N abnormalities. Sensory, motor and Cerebellar functions appear Nl w/o focal abnormalities. Psyche - Mental status normal & appropriate.  No delusions, ideations or obvious mood abnormalities.  Skin - finds a fine erythematous confluent area of erythema extending over the Rt shoulder from the biceps to the Rt neck and over the ant & post shoulder. No warmth or ulcerations. Also there is an area of erythema over the Rt scrotal area extending to the inner Rt thigh.     Assessment & Plan:   1. Contact dermatitis  - predniSONE (DELTASONE) 20 MG tablet; 1 tab 3 x day for 2 days, then 1 tab 2 x day for 2 days, then 1 tab 1 x day for 3 days  Dispense: 13 tablet; Refill: 0  2. Candidiasis of perineum  - fluconazole (DIFLUCAN) 150 MG tablet; Take 1 tablet 2 x/ week for yeast skin infection  Dispense: 8 tablet; Refill: 3

## 2016-02-21 ENCOUNTER — Other Ambulatory Visit: Payer: Self-pay | Admitting: Internal Medicine

## 2016-03-17 ENCOUNTER — Ambulatory Visit (INDEPENDENT_AMBULATORY_CARE_PROVIDER_SITE_OTHER): Payer: Medicare Other | Admitting: Physician Assistant

## 2016-03-17 DIAGNOSIS — M25511 Pain in right shoulder: Secondary | ICD-10-CM

## 2016-04-04 ENCOUNTER — Ambulatory Visit (INDEPENDENT_AMBULATORY_CARE_PROVIDER_SITE_OTHER): Payer: Medicare Other | Admitting: Internal Medicine

## 2016-04-04 ENCOUNTER — Encounter: Payer: Self-pay | Admitting: Internal Medicine

## 2016-04-04 VITALS — BP 140/78 | HR 76 | Temp 97.3°F | Resp 16 | Ht 67.0 in | Wt 192.8 lb

## 2016-04-04 DIAGNOSIS — N320 Bladder-neck obstruction: Secondary | ICD-10-CM

## 2016-04-04 DIAGNOSIS — I10 Essential (primary) hypertension: Secondary | ICD-10-CM | POA: Diagnosis not present

## 2016-04-04 DIAGNOSIS — Z125 Encounter for screening for malignant neoplasm of prostate: Secondary | ICD-10-CM | POA: Diagnosis not present

## 2016-04-04 DIAGNOSIS — G4730 Sleep apnea, unspecified: Secondary | ICD-10-CM

## 2016-04-04 DIAGNOSIS — E1122 Type 2 diabetes mellitus with diabetic chronic kidney disease: Secondary | ICD-10-CM

## 2016-04-04 DIAGNOSIS — Z136 Encounter for screening for cardiovascular disorders: Secondary | ICD-10-CM

## 2016-04-04 DIAGNOSIS — Z1212 Encounter for screening for malignant neoplasm of rectum: Secondary | ICD-10-CM

## 2016-04-04 DIAGNOSIS — G4710 Hypersomnia, unspecified: Secondary | ICD-10-CM

## 2016-04-04 DIAGNOSIS — Z79899 Other long term (current) drug therapy: Secondary | ICD-10-CM

## 2016-04-04 DIAGNOSIS — N182 Chronic kidney disease, stage 2 (mild): Secondary | ICD-10-CM | POA: Diagnosis not present

## 2016-04-04 DIAGNOSIS — E782 Mixed hyperlipidemia: Secondary | ICD-10-CM

## 2016-04-04 DIAGNOSIS — I25700 Atherosclerosis of coronary artery bypass graft(s), unspecified, with unstable angina pectoris: Secondary | ICD-10-CM

## 2016-04-04 DIAGNOSIS — Z23 Encounter for immunization: Secondary | ICD-10-CM | POA: Diagnosis not present

## 2016-04-04 DIAGNOSIS — E559 Vitamin D deficiency, unspecified: Secondary | ICD-10-CM

## 2016-04-04 LAB — CBC WITH DIFFERENTIAL/PLATELET
BASOS ABS: 0 {cells}/uL (ref 0–200)
Basophils Relative: 0 %
Eosinophils Absolute: 104 cells/uL (ref 15–500)
Eosinophils Relative: 2 %
HEMATOCRIT: 48.1 % (ref 38.5–50.0)
HEMOGLOBIN: 16.5 g/dL (ref 13.2–17.1)
LYMPHS ABS: 1872 {cells}/uL (ref 850–3900)
LYMPHS PCT: 36 %
MCH: 30.8 pg (ref 27.0–33.0)
MCHC: 34.3 g/dL (ref 32.0–36.0)
MCV: 89.9 fL (ref 80.0–100.0)
MONO ABS: 468 {cells}/uL (ref 200–950)
MPV: 9.9 fL (ref 7.5–12.5)
Monocytes Relative: 9 %
NEUTROS PCT: 53 %
Neutro Abs: 2756 cells/uL (ref 1500–7800)
Platelets: 155 10*3/uL (ref 140–400)
RBC: 5.35 MIL/uL (ref 4.20–5.80)
RDW: 14 % (ref 11.0–15.0)
WBC: 5.2 10*3/uL (ref 3.8–10.8)

## 2016-04-04 LAB — URINALYSIS, ROUTINE W REFLEX MICROSCOPIC
BILIRUBIN URINE: NEGATIVE
Glucose, UA: NEGATIVE
HGB URINE DIPSTICK: NEGATIVE
KETONES UR: NEGATIVE
Leukocytes, UA: NEGATIVE
NITRITE: NEGATIVE
PH: 7.5 (ref 5.0–8.0)
Protein, ur: NEGATIVE
SPECIFIC GRAVITY, URINE: 1.007 (ref 1.001–1.035)

## 2016-04-04 LAB — PSA: PSA: 1.9 ng/mL (ref <=4.0)

## 2016-04-04 LAB — TSH: TSH: 1.31 mIU/L (ref 0.40–4.50)

## 2016-04-04 NOTE — Patient Instructions (Signed)

## 2016-04-04 NOTE — Progress Notes (Signed)
[facility]   [name], M.D.    Uvaldo Bristle. Silverio Lay, P.A.-C      Starlyn Skeans, P.A.-C  [facility]                [address] [place] [address] Telephone [phone] Telefax [phone] Annual  Screening/Preventative Visit  & Comprehensive Evaluation & Examination     This very nice 70 y.o. MWM presents for a Screening/Preventative Visit & comprehensive evaluation and management of multiple medical co-morbidities.  Patient has been followed for HTN, T2_NIDDM w/CKD2 , Hyperlipidemia and Vitamin D Deficiency. Patient has upcoming Colonoscopy scheduled 04/11/2016 with Dr Oletta Lamas.      HTN predates circa 1999. Patient's BP has been controlled at home.Today's BP is 140/78. Patient also has ASCAD and had 2 stents for ACS in 1992 and was sent by Dr Wynonia Lawman to [facility] for laser angioplasty . In 2012, cath found patent stents. Patient denies any cardiac symptoms as chest pain, palpitations, shortness of breath, dizziness or ankle swelling.     Patient's hyperlipidemia is controlled with diet and medications. Patient denies myalgias or other medication SE's. Last lipids were not at goal and also Trig's were elevated:  Lab Results  Component Value Date   CHOL 182 12/22/2015   HDL 41 12/22/2015   LDLCALC 79 12/22/2015   LDLDIRECT 116.0 06/03/2013   TRIG 308 (H) 12/22/2015   CHOLHDL 4.4 12/22/2015      Patient was dx'd with T2_NIDDM in 2012 and with dieting and weight loss in 2013 his A1c dropped to 5.8%  And 5.5% in 2014 and patient denies reactive hypoglycemic symptoms, visual blurring, diabetic polys or paresthesias. He reports CBG's range betw 80-110 mg%. Last A1c was normal & at goal:  Lab Results  Component Value Date   HGBA1C 5.4 12/22/2015       Patient has hx/o Low T and had deferred Tx in the past. Finally, patient has history of Vitamin D Deficiency in 2008  of "45" on treatment  and last  vitamin D was at goal: Lab Results  Component Value Date   VD25OH 80 03/11/2015   Current Outpatient Prescriptions on File Prior to Visit  Medication Sig  . VITAMIN C 1000 MG Take 1,000 mg by mouth daily.  Marland Kitchen aspirin EC 81 MG Take 81 mg by mouth daily.  . B COMPLEX Take by mouth daily.  . bisoprolol-hctz 2.5-6.25  TAKE 1 TAB DAILY  . CHOLINE CITRATE 650 MG  Take by mouth.  . TEMOVATE 0.05 % ext soln Apply 1 application topically daily.  . clopidogrel  75 MG  TAKE ONE TABLET BY MOUTH DAILY  . CO Q 10 100 MG Take 100 mg by mouth daily.   . fish oil-omega-3  1000 MG  Take 2 g by mouth daily.   Marland Kitchen FLAX SEED OIL 1000 MG  Take 1,000 mg by mouth 2 (two) times daily.   . fluconazole 150 MG tablet Take 1 tablet 2 x/ week for yeast skin infection  . LIDEX 0.05 % ext soln   . folic acid  195 MCG  Take 800 mcg by mouth daily.  . ATROVENT 0.03 % nasal spray 1-2 puffs each nostril every 6 hours if needed, before meals  . ketoconazole 2 % shampoo Apply 1 application topically 2 (two) times a week.  . Magnesium 500 MG  Take 500  mg by mouth 2 (two) times daily.  Marland Kitchen MELATONIN  Take 30 mg by mouth daily.  . Multiple Vitamin  Take 1 capsule by mouth daily.    . niacin 500 MG tablet Take 500 mg by mouth 2 (two) times daily.  Marland Kitchen NITROSTAT 0.4 MG SL  prn  . ALA(alpha lipoic) Takes daily  . lecithin 1200 mg Takes  2 times daily.  . Valerian 400 mg  3 tabs at night.  . Turmeric 500 MG Take 500 mg by mouth 4 (four) times daily.    Allergies  Allergen Reactions  . Codeine Itching and Other (See Comments)    Also hallucinations   . Morphine And Related Other (See Comments)    Hallucinations  . Nsaids Other (See Comments)    BLEEDING  . Crestor [Rosuvastatin]   . Cymbalta [Duloxetine Hcl]   . Dilaudid [Hydromorphone Hcl]   . Effexor [Venlafaxine]   . Gluten Meal   . Percocet [Oxycodone-Acetaminophen]   . Sulfa Antibiotics   . Topamax [Topiramate]   . Trazodone And Nefazodone   . Adhesive [Tape] Other  (See Comments)    SKIN PEELS  . Penicillins Rash   Past Medical History:  Diagnosis Date  . Anxiety    treated /w Xanax for mild depression , using 2 times per day, not PRN  . Asthma   . Benign prostatic hypertrophy   . CAD (coronary artery disease)    pt last left heart cath was in Nov 2008. EF was 55% on left ventriculogram. Circumflex was totally occluded after the 1st obtuse marginal with collaterals supplying the distal circumflex. LAD showed luminal irregularities. The stent in LAD was patent. The RCA showed luminal irregularities. The stent in th emid RCA and PDA were patent. There were no inverventions.   . Cancer ([place])    basal cell- face & head  . Chronic obstructive pulmonary disease (HCC)    asthma  . Cluster headache    relative to sinus problems   . Cluster headache   . Depression   . DM2 (diabetes mellitus, type 2) ([place])    well controlled  . GERD (gastroesophageal reflux disease)    hiatal hernia. Patient did hav ea Nissen fundoplication  . History of blood transfusion    need for Bld. transfusion relative to taking NSAIDS  . HTN (hypertension)    pt. followed by [facility], last cardiac visit 2012, one yr. ago  . Low back pain    chronic  . 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 Obesity   . Osteoarthritis   . Peptic ulcer disease   . Sleep concern    states the study (2003 )was done at [facility]., it failed & he was told to return & he never did. Pt. states he has been found by prev. hosp. staff that he has to be told when to breathe & his wife does the same.   Marland Kitchen Spinal fracture    hx of traumatic in Jan 2009 after falling off the roof   Health Maintenance  Topic Date Due  . Hepatitis C Screening  09/18/1945  . COLONOSCOPY  10/29/1993  . INFLUENZA VACCINE  01/12/2016  . OPHTHALMOLOGY EXAM  01/22/2016  . FOOT EXAM  03/10/2016  . URINE MICROALBUMIN  03/10/2016  . HEMOGLOBIN A1C   06/23/2016  . TETANUS/TDAP  12/08/2024  . ZOSTAVAX  Completed  . PNA vac Low  Risk Adult  Completed   Immunization History  Administered Date(s) Administered  . DT 12/09/2014  . Influenza Split 02/17/2012  . Influenza, High Dose Seasonal PF 03/03/2014, 03/11/2015, 04/04/2016  . Pneumococcal Conjugate-13 12/25/2014  . Pneumococcal Polysaccharide-23 01/19/2012  . Pneumococcal-Unspecified 06/13/1997  . Td 06/13/2004  . Zoster 06/14/2007   Past Surgical History:  Procedure Laterality Date  . BACK SURGERY     x3- last surgery lumbar- 1998- / fusion   . blepheroplasty     both eyes  . C5-T6 posterior fusion     with Oasis and radius screws  . CARDIAC CATHETERIZATION     2008 & 2012 multiple stents    . cataracts     cataracts removed- /w IOL - both eyes   . ESOPHAGEAL MANOMETRY    . EYE SURGERY    . HARDWARE REMOVAL  02/20/2012   Procedure: HARDWARE REMOVAL;  Surgeon: [name], MD;  Location: [facility];  Service: Neurosurgery;  Laterality: N/A;  Hardware Removal  . HIATAL HERNIA REPAIR    . LAPAROSCOPIC CHOLECYSTECTOMY     & IOC  . LUMBAR SPINE SURGERY     x4  . multiple percutaneous coronary interventions    . NASAL SINUS SURGERY     x2, sees Dr. [name], still having problems, states he uses Benadryl PRN- up to 5 times per day   . right carpal tunnel release    . right temporal artery biopsy    . UMBILICAL HERNIA REPAIR     Family History  Problem Relation Age of Onset  . Heart failure Mother     in her 66s  . Coronary artery disease Father     developed in his 2s  . Heart attack Brother     in there 82s  . Heart attack Brother     in there 55s  . Gout Sister    Social History   Social History  . Marital status: Married    Spouse name: N/A  . Number of children: N/A  . Years of education: N/A   Occupational History  . Retired Chief Strategy Officer   Social History Main Topics  . Smoking status: Former Smoker    Quit date: 02/14/1978  . Smokeless tobacco: Not on  file  . Alcohol use 0.0 oz/week     Comment: rare  . Drug use: No  . Sexual activity: Not on file    ROS Constitutional: Denies fever, chills, weight loss/gain, headaches, insomnia,  night sweats or change in appetite. Does c/o fatigue. Eyes: Denies redness, blurred vision, diplopia, discharge, itchy or watery eyes.  ENT: Denies discharge, congestion, post nasal drip, epistaxis, sore throat, earache, hearing loss, dental pain, Tinnitus, Vertigo, Sinus pain or snoring.  Cardio: Denies chest pain, palpitations, irregular heartbeat, syncope, dyspnea, diaphoresis, orthopnea, PND, claudication or edema Respiratory: denies cough, dyspnea, DOE, pleurisy, hoarseness, laryngitis or wheezing.  Gastrointestinal: Denies dysphagia, heartburn, reflux, water brash, pain, cramps, nausea, vomiting, bloating, diarrhea, constipation, hematemesis, melena, hematochezia, jaundice or hemorrhoids Genitourinary: Denies dysuria, frequency, urgency, nocturia, hesitancy, discharge, hematuria or flank pain Musculoskeletal: Denies arthralgia, myalgia, stiffness, Jt. Swelling, pain, limp or strain/sprain. Denies Falls. Skin: Denies puritis, rash, hives, warts, acne, eczema or change in skin lesion Neuro: No weakness, tremor, incoordination, spasms, paresthesia or pain Psychiatric: Denies confusion, memory loss or sensory loss. Denies Depression. Endocrine: Denies change in weight, skin, hair change, nocturia, and paresthesia, diabetic polys, visual blurring or hyper / hypo glycemic episodes.  Heme/Lymph: No excessive bleeding, bruising  or enlarged lymph nodes.  Physical Exam  BP 140/78   Pulse 76   Temp 97.3 F (36.3 C)   Resp 16   Ht '5\' 7"'$  (1.702 m)   Wt 192 lb 12.8 oz (87.5 kg)   BMI 30.20 kg/m   General Appearance: Well nourished, in no apparent distress.  Eyes: PERRLA, EOMs, conjunctiva no swelling or erythema, normal fundi and vessels. Sinuses: No frontal/maxillary tenderness ENT/Mouth: EACs patent / TMs   nl. Nares clear without erythema, swelling, mucoid exudates. Oral hygiene is good. No erythema, swelling, or exudate. Tongue normal, non-obstructing. Tonsils not swollen or erythematous. Hearing normal.  Neck: Supple, thyroid normal. No bruits, nodes or JVD. Respiratory: Respiratory effort normal.  BS equal and clear bilateral without rales, rhonci, wheezing or stridor. Cardio: Heart sounds are normal with regular rate and rhythm and no murmurs, rubs or gallops. Peripheral pulses are normal and equal bilaterally without edema. No aortic or femoral bruits. Chest: symmetric with normal excursions and percussion.  Abdomen: Soft, with Nl bowel sounds. Nontender, no guarding, rebound, hernias, masses, or organomegaly.  Lymphatics: Non tender without lymphadenopathy.  Genitourinary: No hernias.Testes nl. DRE - prostate nl for age - smooth & firm w/o nodules. Musculoskeletal: Full ROM all peripheral extremities, joint stability, 5/5 strength, and normal gait. Skin: Warm and dry without rashes, lesions, cyanosis, clubbing or  ecchymosis.  Neuro: Cranial nerves intact, reflexes equal bilaterally. Normal muscle tone, no cerebellar symptoms. Sensation intact to touch, Vibratory and Monofilament  To the feet bilaterally.  Pysch: Alert and oriented X 3 with normal affect, insight and judgment appropriate.   Assessment and Plan  1. Annual Preventative/Screening Exam   - Microalbumin / creatinine urine ratio - EKG 12-Lead - [name], RETROPERITNL ABD,  LTD - TSH  2. Hyperlipidemia  - EKG 12-Lead - [name], RETROPERITNL ABD,  LTD - Lipid panel - TSH  3. CKD stage 2 due to type 2 diabetes mellitus (HCC)  - EKG 12-Lead - [name], RETROPERITNL ABD,  LTD - LOW EXTREMITY NEUR EXAM DOCUM - Hemoglobin A1c - Insulin, random  4. Vitamin D deficiency  - VITAMIN D 25 Hydroxy   5.  ASCAD ([place])  - EKG 12-Lead  6. Screening for rectal cancer   7. Prostate cancer screening  - PSA  8. Hypersomnia with sleep  apnea   9. BPH/Prostatism  - PSA  10. Screening for ischemic heart disease  - EKG 12-Lead  11. Screening for AAA (aortic abdominal aneurysm)  - [name], RETROPERITNL ABD,  LTD  12. Medication management  - Urinalysis, Routine w reflex microscopic  - CBC with Differential/Platelet - BASIC METABOLIC PANEL WITH GFR - Hepatic function panel - Magnesium  13. Bladder neck obstruction - PSA  14. Need for prophylactic vaccination and inoculation against influenza  - Flu vaccine HIGH DOSE PF (Fluzone High dose)       Continue prudent diet as discussed, weight control, BP monitoring, regular exercise, and medications as discussed.  Discussed med effects and SE's. Routine screening labs and tests as requested with regular follow-up as recommended. Over 40 minutes of exam, counseling, chart review and high complex critical decision making was performed

## 2016-04-05 LAB — LIPID PANEL
CHOLESTEROL: 165 mg/dL (ref 125–200)
HDL: 39 mg/dL — ABNORMAL LOW (ref >=40)
LDL Cholesterol: 99 mg/dL (ref <130)
Total CHOL/HDL Ratio: 4.2 Ratio (ref <=5.0)
Triglycerides: 136 mg/dL (ref <150)
VLDL: 27 mg/dL (ref <30)

## 2016-04-05 LAB — HEMOGLOBIN A1C
HEMOGLOBIN A1C: 5.4 % (ref <5.7)
MEAN PLASMA GLUCOSE: 108 mg/dL

## 2016-04-05 LAB — BASIC METABOLIC PANEL WITH GFR
BUN: 19 mg/dL (ref 7–25)
CALCIUM: 9.2 mg/dL (ref 8.6–10.3)
CO2: 23 mmol/L (ref 20–31)
CREATININE: 1.07 mg/dL (ref 0.70–1.18)
Chloride: 102 mmol/L (ref 98–110)
GFR, Est African American: 80 mL/min (ref >=60)
GFR, Est Non African American: 69 mL/min (ref >=60)
GLUCOSE: 86 mg/dL (ref 65–99)
Potassium: 3.9 mmol/L (ref 3.5–5.3)
Sodium: 137 mmol/L (ref 135–146)

## 2016-04-05 LAB — HEPATIC FUNCTION PANEL
ALBUMIN: 3.7 g/dL (ref 3.6–5.1)
ALT: 15 U/L (ref 9–46)
AST: 21 U/L (ref 10–35)
Alkaline Phosphatase: 44 U/L (ref 40–115)
Bilirubin, Direct: 0.1 mg/dL (ref <=0.2)
Indirect Bilirubin: 0.4 mg/dL (ref 0.2–1.2)
TOTAL PROTEIN: 6.8 g/dL (ref 6.1–8.1)
Total Bilirubin: 0.5 mg/dL (ref 0.2–1.2)

## 2016-04-05 LAB — MICROALBUMIN / CREATININE URINE RATIO
CREATININE, URINE: 26 mg/dL (ref 20–370)
MICROALB UR: 5.6 mg/dL
Microalb Creat Ratio: 215 mcg/mg creat — ABNORMAL HIGH (ref <30)

## 2016-04-05 LAB — INSULIN, RANDOM: INSULIN: 14.6 u[IU]/mL (ref 2.0–19.6)

## 2016-04-05 LAB — VITAMIN D 25 HYDROXY (VIT D DEFICIENCY, FRACTURES): VIT D 25 HYDROXY: 97 ng/mL (ref 30–100)

## 2016-04-05 LAB — MAGNESIUM: MAGNESIUM: 1.9 mg/dL (ref 1.5–2.5)

## 2016-04-11 DIAGNOSIS — Z1211 Encounter for screening for malignant neoplasm of colon: Secondary | ICD-10-CM | POA: Diagnosis not present

## 2016-04-11 DIAGNOSIS — K5730 Diverticulosis of large intestine without perforation or abscess without bleeding: Secondary | ICD-10-CM | POA: Diagnosis not present

## 2016-04-11 DIAGNOSIS — Z800 Family history of malignant neoplasm of digestive organs: Secondary | ICD-10-CM | POA: Diagnosis not present

## 2016-04-18 ENCOUNTER — Ambulatory Visit (INDEPENDENT_AMBULATORY_CARE_PROVIDER_SITE_OTHER): Payer: Medicare Other | Admitting: Physician Assistant

## 2016-04-18 DIAGNOSIS — Z9889 Other specified postprocedural states: Secondary | ICD-10-CM

## 2016-04-18 NOTE — Progress Notes (Signed)
Office Visit Note   Patient: [name]           Date of Birth: 05/14/1946           MRN: [id number] Visit Date: 04/18/2016              Requested by: [name], MD [address] [place], [place] [address] PCP: Alesia Richards, MD   Assessment & Plan: Visit Diagnoses:  1. S/P arthroscopy of shoulder     Plan: Continue range of motion strengthening exercises right shoulder.  Follow-Up Instructions: Return if symptoms worsen or fail to improve.   Orders:  No orders of the defined types were placed in this encounter.  No orders of the defined types were placed in this encounter.     Procedures: No procedures performed   Clinical Data: No additional findings.   Subjective: Chief Complaint  Patient presents with  . Right Shoulder - Follow-up    Pt here for 4 week ROV on Right shoulder, states is better.     Review of Systems   Objective: Vital Signs: There were no vitals taken for this visit.  Physical Exam  Right Shoulder Exam   Range of Motion  Active Abduction: normal  Passive Abduction: normal  Forward Flexion: normal  External Rotation: normal   Muscle Strength  Internal Rotation: 5/5  External Rotation: 4/5   Tests  Impingement: negative   Left Shoulder Exam   Range of Motion  Active Abduction: normal  Passive Abduction: normal  Forward Flexion: normal  External Rotation: normal   Muscle Strength  Internal Rotation: 5/5  External Rotation: 5/5   Tests  Impingement: negative      Specialty Comments:  No specialty comments available.  Imaging: No results found.   PMFS History: Patient Active Problem List   Diagnosis Date Noted  . OSA and COPD overlap syndrome ([place]) 09/14/2015  . BMI 29.0-29.9,adult 03/11/2015  . Medicare annual wellness visit, subsequent 03/11/2015  . Hypersomnia with sleep apnea 10/23/2014  . Food allergy 07/01/2014  . Rhinitis, nonallergic, chronic 07/01/2014    . Vitamin D deficiency 07/25/2013  . Medication management 07/25/2013  . CKD stage 2 due to type 2 diabetes mellitus ([place]) 01/13/2009  . Hyperlipidemia 01/13/2009  . Morbid obesity (BMI 29.22) 01/13/2009  . Cluster headache syndrome 01/13/2009  . Essential hypertension 01/13/2009  . Coronary atherosclerosis 01/13/2009  . COPD mixed type ([place]) 01/13/2009  . GERD 01/13/2009  . Lumbago, Chronic 01/13/2009  . PUD 01/13/2009  . BPH/Prostatism 01/13/2009   Past Medical History:  Diagnosis Date  . Anxiety    treated /w Xanax for mild depression , using 2 times per day, not PRN  . Asthma   . Benign prostatic hypertrophy   . CAD (coronary artery disease)    pt last left heart cath was in Nov 2008. EF was 55% on left ventriculogram. Circumflex was totally occluded after the 1st obtuse marginal with collaterals supplying the distal circumflex. LAD showed luminal irregularities. The stent in LAD was patent. The RCA showed luminal irregularities. The stent in th emid RCA and PDA were patent. There were no inverventions.   . Cancer ([place])    basal cell- face & head  . Chronic obstructive pulmonary disease (HCC)    asthma  . Cluster headache    relative to sinus problems   . Cluster headache   . Depression   . DM2 (diabetes mellitus, type 2) ([place])    well controlled  .  GERD (gastroesophageal reflux disease)    hiatal hernia. Patient did hav ea Nissen fundoplication  . History of blood transfusion    need for Bld. transfusion relative to taking NSAIDS  . HTN (hypertension)    pt. followed by [facility], last cardiac visit 2012, one yr. ago  . Low back pain    chronic  . 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 Obesity   . Osteoarthritis   . Peptic ulcer disease   . Sleep concern    states the study (2003 )was done at [facility]., it failed & he was told to return & he never did. Pt. states he has been found by prev.  hosp. staff that he has to be told when to breathe & his wife does the same.   Marland Kitchen Spinal fracture    hx of traumatic in Jan 2009 after falling off the roof    Family History  Problem Relation Age of Onset  . Heart failure Mother     in her 81s  . Coronary artery disease Father     developed in his 38s  . Heart attack Brother     in there 45s  . Heart attack Brother     in there 57s  . Gout Sister     Past Surgical History:  Procedure Laterality Date  . BACK SURGERY     x3- last surgery lumbar- 1998- / fusion   . blepheroplasty     both eyes  . C5-T6 posterior fusion     with Oasis and radius screws  . CARDIAC CATHETERIZATION     2008 & 2012 multiple stents    . cataracts     cataracts removed- /w IOL - both eyes   . ESOPHAGEAL MANOMETRY    . EYE SURGERY    . HARDWARE REMOVAL  02/20/2012   Procedure: HARDWARE REMOVAL;  Surgeon: [name], MD;  Location: [facility];  Service: Neurosurgery;  Laterality: N/A;  Hardware Removal  . HIATAL HERNIA REPAIR    . LAPAROSCOPIC CHOLECYSTECTOMY     & IOC  . LUMBAR SPINE SURGERY     x4  . multiple percutaneous coronary interventions    . NASAL SINUS SURGERY     x2, sees Dr. [name], still having problems, states he uses Benadryl PRN- up to 5 times per day   . right carpal tunnel release    . right temporal artery biopsy    . UMBILICAL HERNIA REPAIR     Social History   Occupational History  . Not on file.   Social History Main Topics  . Smoking status: Former Smoker    Quit date: 02/14/1978  . Smokeless tobacco: Not on file  . Alcohol use 0.0 oz/week     Comment: rare  . Drug use: No  . Sexual activity: Not on file

## 2016-05-07 ENCOUNTER — Other Ambulatory Visit: Payer: Self-pay | Admitting: Internal Medicine

## 2016-05-14 ENCOUNTER — Encounter: Payer: Self-pay | Admitting: *Deleted

## 2016-05-21 ENCOUNTER — Other Ambulatory Visit: Payer: Self-pay | Admitting: Internal Medicine

## 2016-06-21 ENCOUNTER — Ambulatory Visit (INDEPENDENT_AMBULATORY_CARE_PROVIDER_SITE_OTHER): Payer: Medicare Other | Admitting: Internal Medicine

## 2016-06-21 ENCOUNTER — Encounter: Payer: Self-pay | Admitting: Internal Medicine

## 2016-06-21 VITALS — BP 138/76 | HR 60 | Temp 98.2°F | Resp 16 | Ht 67.0 in | Wt 197.0 lb

## 2016-06-21 DIAGNOSIS — B9789 Other viral agents as the cause of diseases classified elsewhere: Secondary | ICD-10-CM | POA: Diagnosis not present

## 2016-06-21 DIAGNOSIS — J069 Acute upper respiratory infection, unspecified: Secondary | ICD-10-CM

## 2016-06-21 MED ORDER — PROMETHAZINE-DM 6.25-15 MG/5ML PO SYRP: 5.0000 mL | ORAL_SOLUTION | Freq: Four times a day (QID) | ORAL | 0 refills | Status: DC | PRN

## 2016-06-21 MED ORDER — ALBUTEROL SULFATE HFA 108 (90 BASE) MCG/ACT IN AERS: 2.0000 | INHALATION_SPRAY | RESPIRATORY_TRACT | 0 refills | Status: DC | PRN

## 2016-06-21 MED ORDER — PREDNISONE 20 MG PO TABS: ORAL_TABLET | ORAL | 0 refills | Status: DC

## 2016-06-21 MED ORDER — AZITHROMYCIN 250 MG PO TABS: ORAL_TABLET | ORAL | 0 refills | Status: DC

## 2016-06-21 NOTE — Progress Notes (Signed)
HPI  Patient presents to the office for evaluation of cough.  It has been going on for 5 days.  Patient reports night > day, wet, worse with lying down, yellow sputum production out of his lungs and his nose.  They also endorse change in voice, postnasal drip, sputum production, wheezing and worsening nasal congestion, bilateral ear congestion.  He had a tactile temperature.  They have tried nyqiuil generic and dayquil.  They report that nothing has worked.  They denies other sick contacts.  He notes that his allergies have been problematic recently. He reports that he cannot take the claritin, zyrtec, or allegra.  He reports that he doesn't like the flonase either.     Review of Systems  Constitutional: Positive for malaise/fatigue. Negative for chills and fever.  HENT: Positive for congestion, ear pain, hearing loss and sore throat.   Respiratory: Positive for cough. Negative for sputum production, shortness of breath and wheezing.   Cardiovascular: Negative for chest pain, palpitations and leg swelling.  Neurological: Positive for headaches.    PE:  Vitals:   06/21/16 1041  BP: 138/76  Pulse: 60  Resp: 16  Temp: 98.2 F (36.8 C)   General:  Alert and non-toxic, WDWN, NAD HEENT: NCAT, PERLA, EOM normal, no occular discharge or erythema.  Nasal mucosal edema with sinus tenderness to palpation.  Oropharynx clear with minimal oropharyngeal edema and erythema.  Mucous membranes moist and pink. Neck:  Cervical adenopathy Chest:  RRR no MRGs.  Lungs clear to auscultation A&P with no wheezes rhonchi or rales.   Abdomen: +BS x 4 quadrants, soft, non-tender, no guarding, rigidity, or rebound. Skin: warm and dry no rash Neuro: A&Ox4, CN II-XII grossly intact  Assessment and Plan:   1. Viral URI with cough -cont daily antihistamine -nasal saline -if worsening SOB or any other concerning symptoms patient to let office know.  If after hours or weekend patient aware to go to the ER -  predniSONE (DELTASONE) 20 MG tablet; 3 tabs po daily x 3 days, then 2 tabs x 3 days, then 1.5 tabs x 3 days, then 1 tab x 3 days, then 0.5 tabs x 3 days  Dispense: 27 tablet; Refill: 0 - promethazine-dextromethorphan (PROMETHAZINE-DM) 6.25-15 MG/5ML syrup; Take 5 mLs by mouth 4 (four) times daily as needed for cough.  Dispense: 118 mL; Refill: 0 - azithromycin (ZITHROMAX Z-PAK) 250 MG tablet; 2 po day one, then 1 daily x 4 days  Dispense: 6 tablet; Refill: 0 - albuterol (PROVENTIL HFA;VENTOLIN HFA) 108 (90 Base) MCG/ACT inhaler; Inhale 2 puffs into the lungs every 2 (two) hours as needed for wheezing or shortness of breath (cough).  Dispense: 1 Inhaler; Refill: 0

## 2016-07-11 ENCOUNTER — Ambulatory Visit: Payer: Self-pay | Admitting: Internal Medicine

## 2016-07-12 ENCOUNTER — Ambulatory Visit (INDEPENDENT_AMBULATORY_CARE_PROVIDER_SITE_OTHER): Payer: Medicare Other | Admitting: Physician Assistant

## 2016-07-12 ENCOUNTER — Encounter: Payer: Self-pay | Admitting: Physician Assistant

## 2016-07-12 VITALS — BP 140/80 | HR 84 | Temp 97.6°F | Resp 14 | Ht 67.0 in | Wt 198.0 lb

## 2016-07-12 DIAGNOSIS — G4733 Obstructive sleep apnea (adult) (pediatric): Secondary | ICD-10-CM | POA: Diagnosis not present

## 2016-07-12 DIAGNOSIS — J449 Chronic obstructive pulmonary disease, unspecified: Secondary | ICD-10-CM | POA: Diagnosis not present

## 2016-07-12 DIAGNOSIS — E1122 Type 2 diabetes mellitus with diabetic chronic kidney disease: Secondary | ICD-10-CM

## 2016-07-12 DIAGNOSIS — E559 Vitamin D deficiency, unspecified: Secondary | ICD-10-CM | POA: Diagnosis not present

## 2016-07-12 DIAGNOSIS — E782 Mixed hyperlipidemia: Secondary | ICD-10-CM | POA: Diagnosis not present

## 2016-07-12 DIAGNOSIS — Z79899 Other long term (current) drug therapy: Secondary | ICD-10-CM

## 2016-07-12 DIAGNOSIS — I10 Essential (primary) hypertension: Secondary | ICD-10-CM | POA: Diagnosis not present

## 2016-07-12 DIAGNOSIS — N182 Chronic kidney disease, stage 2 (mild): Secondary | ICD-10-CM | POA: Diagnosis not present

## 2016-07-12 LAB — TSH: TSH: 1.53 mIU/L (ref 0.40–4.50)

## 2016-07-12 LAB — CBC WITH DIFFERENTIAL/PLATELET
BASOS ABS: 58 {cells}/uL (ref 0–200)
Basophils Relative: 1 %
EOS PCT: 3 %
Eosinophils Absolute: 174 cells/uL (ref 15–500)
HCT: 48.5 % (ref 38.5–50.0)
HEMOGLOBIN: 16.8 g/dL (ref 13.2–17.1)
LYMPHS ABS: 2030 {cells}/uL (ref 850–3900)
Lymphocytes Relative: 35 %
MCH: 31.1 pg (ref 27.0–33.0)
MCHC: 34.6 g/dL (ref 32.0–36.0)
MCV: 89.8 fL (ref 80.0–100.0)
MPV: 10.1 fL (ref 7.5–12.5)
Monocytes Absolute: 464 cells/uL (ref 200–950)
Monocytes Relative: 8 %
Neutro Abs: 3074 cells/uL (ref 1500–7800)
Neutrophils Relative %: 53 %
Platelets: 160 10*3/uL (ref 140–400)
RBC: 5.4 MIL/uL (ref 4.20–5.80)
RDW: 14.6 % (ref 11.0–15.0)
WBC: 5.8 10*3/uL (ref 3.8–10.8)

## 2016-07-12 LAB — BASIC METABOLIC PANEL WITH GFR
BUN: 18 mg/dL (ref 7–25)
CHLORIDE: 99 mmol/L (ref 98–110)
CO2: 25 mmol/L (ref 20–31)
Calcium: 9.2 mg/dL (ref 8.6–10.3)
Creat: 1.19 mg/dL — ABNORMAL HIGH (ref 0.70–1.18)
GFR, EST NON AFRICAN AMERICAN: 61 mL/min (ref >=60)
GFR, Est African American: 70 mL/min (ref >=60)
GLUCOSE: 74 mg/dL (ref 65–99)
Potassium: 4.4 mmol/L (ref 3.5–5.3)
SODIUM: 137 mmol/L (ref 135–146)

## 2016-07-12 LAB — LIPID PANEL
CHOL/HDL RATIO: 5.8 ratio — AB (ref <5.0)
Cholesterol: 184 mg/dL (ref <200)
HDL: 32 mg/dL — ABNORMAL LOW (ref >40)
LDL CALC: 73 mg/dL (ref <100)
Triglycerides: 395 mg/dL — ABNORMAL HIGH (ref <150)
VLDL: 79 mg/dL — AB (ref <30)

## 2016-07-12 LAB — HEPATIC FUNCTION PANEL
ALT: 16 U/L (ref 9–46)
AST: 22 U/L (ref 10–35)
Albumin: 3.8 g/dL (ref 3.6–5.1)
Alkaline Phosphatase: 56 U/L (ref 40–115)
BILIRUBIN DIRECT: 0.1 mg/dL (ref <=0.2)
BILIRUBIN INDIRECT: 0.4 mg/dL (ref 0.2–1.2)
Total Bilirubin: 0.5 mg/dL (ref 0.2–1.2)
Total Protein: 7 g/dL (ref 6.1–8.1)

## 2016-07-12 MED ORDER — DIAZEPAM 5 MG PO TABS: 5.0000 mg | ORAL_TABLET | Freq: Three times a day (TID) | ORAL | 0 refills | Status: DC | PRN

## 2016-07-12 NOTE — Progress Notes (Signed)
Assessment and Plan:   Hypertension -Continue medication, monitor blood pressure at home. Continue DASH diet.  Reminder to go to the ER if any CP, SOB, nausea, dizziness, severe HA, changes vision/speech, left arm numbness and tingling and jaw pain.  Cholesterol -Continue diet and exercise. Check cholesterol.   OSA and COPD overlap syndrome ([place]) Difficult to treat fatigue/sleep NOT on CPAP, discussed with patient, weight loss discussed   COPD mixed type ([place]) Continue follow up  CKD stage 2 due to type 2 diabetes mellitus ([place]) Discussed general issues about diabetes pathophysiology and management., Educational material distributed., Suggested low cholesterol diet., Encouraged aerobic exercise., Discussed foot care., Reminded to get yearly retinal exam. - BASIC METABOLIC PANEL WITH GFR - Hemoglobin A1c  Morbid Obesity with co morbidities - long discussion about weight loss, diet, and exercise - Lipid panel - Hemoglobin A1c   Continue diet and meds as discussed. Further disposition pending results of labs. Over 30 minutes of exam, counseling, chart review, and critical decision making was performed  HPI 71 y.o. male  presents for 3 month follow up on hypertension, cholesterol, prediabetes, and vitamin D deficiency.   His blood pressure has been controlled at home, normally runs 130/80-130/80, states goes up with coffee and has been higher with cold medications this past month, today their BP is BP: 140/80. Has history of OSA, NOT on CPAP.   He does workout. He denies chest pain, shortness of breath, dizziness.  He is on cholesterol medication and denies myalgias. His cholesterol is at goal. The cholesterol last visit was:   Lab Results  Component Value Date   CHOL 165 04/04/2016   HDL 39 (L) 04/04/2016   LDLCALC 99 04/04/2016   LDLDIRECT 116.0 06/03/2013   TRIG 136 04/04/2016   CHOLHDL 4.2 04/04/2016    He has been working on diet and exercise for prediabetes, and denies  paresthesia of the feet, polydipsia, polyuria and visual disturbances. Last A1C in the office was:  Lab Results  Component Value Date   HGBA1C 5.4 04/04/2016   Patient is on Vitamin D supplement.   Lab Results  Component Value Date   VD25OH 97 04/04/2016     BMI is Body mass index is 31.01 kg/m., he is working on diet and exercise. Suppose to be on CPAP but he is not.  Wt Readings from Last 3 Encounters:  07/12/16 198 lb (89.8 kg)  06/21/16 197 lb (89.4 kg)  04/04/16 192 lb 12.8 oz (87.5 kg)    Current Medications:  Current Outpatient Prescriptions on File Prior to Visit  Medication Sig Dispense Refill  . albuterol (PROVENTIL HFA;VENTOLIN HFA) 108 (90 Base) MCG/ACT inhaler Inhale 2 puffs into the lungs every 2 (two) hours as needed for wheezing or shortness of breath (cough). 1 Inhaler 0  . Ascorbic Acid (VITAMIN C) 1000 MG tablet Take 1,000 mg by mouth daily.    Marland Kitchen aspirin EC 81 MG tablet Take 81 mg by mouth daily.    . B Complex Vitamins (B COMPLEX PO) Take by mouth daily.    . bisoprolol-hydrochlorothiazide (ZIAC) 2.5-6.25 MG tablet TAKE 1 TABLET BY MOUTH DAILY FOR HIGH BLOOD PRESSURE AND HEART 90 tablet 1  . Choline Dihydrogen Citrate (CHOLINE CITRATE) 650 MG TABS Take by mouth.    . clobetasol (TEMOVATE) 0.05 % external solution Apply 1 application topically daily. 50 mL 0  . clopidogrel (PLAVIX) 75 MG tablet TAKE ONE TABLET BY MOUTH DAILY 90 tablet 1  . Coenzyme Q10 (CO Q 10)  100 MG CAPS Take 100 mg by mouth daily.     . fish oil-omega-3 fatty acids 1000 MG capsule Take 2 g by mouth daily.     . Flaxseed, Linseed, (FLAX SEED OIL) 1000 MG CAPS Take 1,000 mg by mouth 2 (two) times daily.     . fluocinonide (LIDEX) 0.05 % external solution     . folic acid (FOLVITE) 751 MCG tablet Take 800 mcg by mouth daily.    Marland Kitchen ipratropium (ATROVENT) 0.03 % nasal spray 1-2 puffs each nostril every 6 hours if needed, before meals 30 mL 12  . Magnesium 500 MG TABS Take 500 mg by mouth 2 (two)  times daily.    Marland Kitchen MELATONIN PO Take 30 mg by mouth daily.    . Multiple Vitamin (MULTIVITAMIN) capsule Take 1 capsule by mouth daily.      . niacin 500 MG tablet Take 500 mg by mouth 2 (two) times daily.    . nitroGLYCERIN (NITROSTAT) 0.4 MG SL tablet DISSOLVE 1 TABLET UNDER TONGUE EVERY 3 TO 5 MINUTES FOR ANGINA 225 tablet 98  . OVER THE COUNTER MEDICATION Takes ALA(alpha lipoic)    . OVER THE COUNTER MEDICATION Takes lecithin 1200 mg 2 times daily.    Marland Kitchen OVER THE COUNTER MEDICATION Valerian 400 mg 3 tabs at night.    . Turmeric 500 MG CAPS Take 500 mg by mouth 4 (four) times daily.      No current facility-administered medications on file prior to visit.    Medical History:  Past Medical History:  Diagnosis Date  . Anxiety    treated /w Xanax for mild depression , using 2 times per day, not PRN  . Asthma   . Benign prostatic hypertrophy   . CAD (coronary artery disease)    pt last left heart cath was in Nov 2008. EF was 55% on left ventriculogram. Circumflex was totally occluded after the 1st obtuse marginal with collaterals supplying the distal circumflex. LAD showed luminal irregularities. The stent in LAD was patent. The RCA showed luminal irregularities. The stent in th emid RCA and PDA were patent. There were no inverventions.   . 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 Chronic obstructive pulmonary disease (HCC)    asthma  . Cluster headache    relative to sinus problems   . Cluster headache   . Depression   . DM2 (diabetes mellitus, type 2) ([place])    well controlled  . GERD (gastroesophageal reflux disease)    hiatal hernia. Patient did hav ea Nissen fundoplication  . History of blood transfusion    need for Bld. transfusion relative to taking NSAIDS  . HTN (hypertension)    pt. followed by [facility], last cardiac visit 2012, one yr. ago  . Low back pain    chronic  . 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 Obesity   . Osteoarthritis   . Peptic ulcer disease   . Sleep concern    states the study (2003 )was done at [facility] it failed & he was told to return & he never did. Pt. states he has been found by prev. hosp. staff that he has to be told when to breathe & his wife does the same.   Marland Kitchen Spinal fracture    hx of traumatic in Jan 2009 after falling off the roof   Allergies:  Allergies  Allergen Reactions  . Codeine Itching and Other (See Comments)    Also hallucinations   . Morphine And Related Other (See Comments)    Hallucinations  . Nsaids Other (See Comments)    BLEEDING  . Crestor [Rosuvastatin]   . Cymbalta [Duloxetine Hcl]   . Dilaudid [Hydromorphone Hcl]   . Effexor [Venlafaxine]   . Gluten Meal   . Percocet [Oxycodone-Acetaminophen]   . Sulfa Antibiotics   . Topamax [Topiramate]   . Trazodone And Nefazodone   . Adhesive [Tape] Other (See Comments)    SKIN PEELS  . Penicillins Rash    Review of Systems:  Review of Systems  Constitutional: Negative for chills, diaphoresis, fever and weight loss.  HENT: Negative for congestion, ear discharge, ear pain, hearing loss, nosebleeds, sore throat and tinnitus.   Eyes: Negative for blurred vision, double vision, photophobia, pain, discharge and redness.  Respiratory: Negative.  Negative for stridor.   Cardiovascular: Negative.   Gastrointestinal: Negative.   Genitourinary: Negative.   Musculoskeletal: Negative.   Skin: Negative.   Neurological: Negative for dizziness, tingling, tremors, sensory change, speech change, focal weakness, seizures, loss of consciousness, weakness and headaches.  Psychiatric/Behavioral: Negative.     Family history- Review and unchanged Social history- Review and unchanged Physical Exam: BP 140/80   Pulse 84   Temp 97.6 F (36.4 C)   Resp 14   Ht '5\' 7"'$  (1.702 m)   Wt 198 lb (89.8 kg)   SpO2 97%   BMI 31.01 kg/m  Wt Readings from Last 3 Encounters:  07/12/16 198 lb (89.8  kg)  06/21/16 197 lb (89.4 kg)  04/04/16 192 lb 12.8 oz (87.5 kg)   General Appearance: Well nourished, in no apparent distress. Eyes: negative findings: corneas clear, pupils equal, round, reactive to light and accomodation and no foreign body with everted lid, positive findings: eyelids/periorbital: internal hordeolum on the right, ptosis on the right, lacrimal gland swelling on the right and periorbital edema on the right and conjunctiva: right injection Sinuses: No Frontal/maxillary tenderness ENT/Mouth: Ext aud canals clear, TMs without erythema, bulging. No erythema, swelling, or exudate on post pharynx.  Tonsils not swollen or erythematous. Hearing normal.  Neck: Supple, thyroid normal.  Respiratory: Respiratory effort normal, BS equal bilaterally without rales, rhonchi, wheezing or stridor.  Cardio: RRR with no MRGs. Brisk peripheral pulses without edema.  Abdomen: Soft, + BS,  Non tender, no guarding, rebound, hernias, masses. Lymphatics: Non tender without lymphadenopathy.  Musculoskeletal: Full ROM, 5/5 strength, Normal gait Skin: Warm, dry without rashes, lesions, ecchymosis.  Neuro: Cranial nerves intact. Normal muscle tone, no cerebellar symptoms. Psych: Awake and oriented X 3, normal affect, Insight and Judgment appropriate.    Vicie Mutters, PA-C 2:02 PM [facility] [place] [facility]

## 2016-07-12 NOTE — Patient Instructions (Signed)
Simple math prevails.    1st - exercise does not produce significant weight loss - at best one converts fat into muscle , "bulks up", loses inches, but usually stays "weight neutral"     2nd - think of your body weightas a check book: If you eat more calories than you burn up - you save money or gain weight .... Or if you spend more money than you put in the check book, ie burn up more calories than you eat, then you lose weight     3rd - if you walk or run 1 mile, you burn up 100 calories - you have to burn up 3,500 calories to lose 1 pound, ie you have to walk/run 35 miles to lose 1 measly pound. So if you want to lose 10 #, then you have to walk/run 350 miles, so.... clearly exercise is not the solution.     4. So if you consume 1,500 calories, then you have to burn up the equivalent of 15 miles to stay weight neutral - It also stands to reason that if you consume 1,500 cal/day and don't lose weight, then you must be burning up about 1,500 cals/day to stay weight neutral.     5. If you really want to lose weight, you must cut your calorie intake 300 calories /day and at that rate you should lose about 1 # every 3 days.   6. Please purchase Dr [name]'s book(s) "The End of Dieting" & "Eat to Live" . It has some great concepts and recipes.

## 2016-07-13 LAB — MAGNESIUM: Magnesium: 1.9 mg/dL (ref 1.5–2.5)

## 2016-07-13 LAB — HEMOGLOBIN A1C
HEMOGLOBIN A1C: 5.4 % (ref <5.7)
MEAN PLASMA GLUCOSE: 108 mg/dL

## 2016-07-13 NOTE — Progress Notes (Signed)
Pt aware of lab results & voiced understanding of those results.

## 2016-07-13 NOTE — Progress Notes (Signed)
Rx was called into pharmacy( Valium )

## 2016-07-18 ENCOUNTER — Ambulatory Visit: Payer: Self-pay | Admitting: Internal Medicine

## 2016-08-01 ENCOUNTER — Encounter: Payer: Self-pay | Admitting: Internal Medicine

## 2016-08-01 ENCOUNTER — Ambulatory Visit (INDEPENDENT_AMBULATORY_CARE_PROVIDER_SITE_OTHER): Payer: Medicare Other | Admitting: Internal Medicine

## 2016-08-01 VITALS — BP 136/80 | HR 68 | Temp 98.2°F | Resp 16 | Ht 67.0 in | Wt 198.0 lb

## 2016-08-01 DIAGNOSIS — K130 Diseases of lips: Secondary | ICD-10-CM

## 2016-08-01 DIAGNOSIS — B009 Herpesviral infection, unspecified: Secondary | ICD-10-CM | POA: Diagnosis not present

## 2016-08-01 MED ORDER — NYSTATIN 100000 UNIT/GM EX CREA: 1.0000 "application " | TOPICAL_CREAM | Freq: Two times a day (BID) | CUTANEOUS | 0 refills | Status: DC

## 2016-08-01 MED ORDER — VALACYCLOVIR HCL 500 MG PO TABS: 500.0000 mg | ORAL_TABLET | Freq: Three times a day (TID) | ORAL | 0 refills | Status: AC

## 2016-08-01 MED ORDER — LIDOCAINE VISCOUS 2 % MT SOLN: 20.0000 mL | OROMUCOSAL | 0 refills | Status: DC | PRN

## 2016-08-01 NOTE — Patient Instructions (Signed)
Please take valtrex 3 times daily to help get rid of the blister in your mouth.  Please use viscous lidocaine as needed for the soreness in the mouth.  Please use either a qtip or you can swish it around in your mouth.    Please use nystatin cream at the corner of the mouth on both sides for 10 days.

## 2016-08-01 NOTE — Progress Notes (Signed)
Assessment and Plan:   1. HSV-1 (herpes simplex virus 1) infection -given recent URI likely HSV.  Wife has a history of HSV outbreaks - valACYclovir (VALTREX) 500 MG tablet; Take 1 tablet (500 mg total) by mouth 3 (three) times daily.  Dispense: 21 tablet; Refill: 0 - lidocaine (XYLOCAINE) 2 % solution; Use as directed 20 mLs in the mouth or throat as needed for mouth pain.  Dispense: 100 mL; Refill: 0  2. Angular cheilitis  - nystatin cream (MYCOSTATIN); Apply 1 application topically 2 (two) times daily. Apply to affected area every 4-6 hours x 10 days  Dispense: 30 g; Refill: 0    HPI 71 y.o.male presents for blisters on his lip which came up on Thursday.  The blisters are burning in nature.  He reports that there was no injury.  He has tried chapstick and also vinegar and peroxide.  He reports that he did try some triple antibiotic ointment as well.  He has never had fever blisters or cold sores on his lip.  His wife does have a history of fever blisters as well.  He reports that he has had thrush before but this is different.   Past Medical History:  Diagnosis Date  . Anxiety    treated /w Xanax for mild depression , using 2 times per day, not PRN  . Asthma   . Benign prostatic hypertrophy   . CAD (coronary artery disease)    pt last left heart cath was in Nov 2008. EF was 55% on left ventriculogram. Circumflex was totally occluded after the 1st obtuse marginal with collaterals supplying the distal circumflex. LAD showed luminal irregularities. The stent in LAD was patent. The RCA showed luminal irregularities. The stent in th emid RCA and PDA were patent. There were no inverventions.   . Cancer ([place])    basal cell- face & head  . Chronic obstructive pulmonary disease (HCC)    asthma  . Cluster headache    relative to sinus problems   . Cluster headache   . Depression   . DM2 (diabetes mellitus, type 2) ([place])    well controlled  . GERD (gastroesophageal reflux disease)    hiatal hernia. Patient did hav ea Nissen fundoplication  . History of blood transfusion    need for Bld. transfusion relative to taking NSAIDS  . HTN (hypertension)    pt. followed by [facility], last cardiac visit 2012, one yr. ago  . Low back pain    chronic  . 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 Obesity   . Osteoarthritis   . Peptic ulcer disease   . Sleep concern    states the study (2003 )was done at [facility] it failed & he was told to return & he never did. Pt. states he has been found by prev. hosp. staff that he has to be told when to breathe & his wife does the same.   Marland Kitchen Spinal fracture    hx of traumatic in Jan 2009 after falling off the roof     Allergies  Allergen Reactions  . Codeine Itching and Other (See Comments)    Also hallucinations   . Morphine And Related Other (See Comments)    Hallucinations  . Nsaids Other (See Comments)    BLEEDING  . Crestor [Rosuvastatin]   . Cymbalta [Duloxetine Hcl]   . Dilaudid [Hydromorphone Hcl]   . Effexor [Venlafaxine]   .  Gluten Meal   . Percocet [Oxycodone-Acetaminophen]   . Sulfa Antibiotics   . Topamax [Topiramate]   . Trazodone And Nefazodone   . Adhesive [Tape] Other (See Comments)    SKIN PEELS  . Penicillins Rash      Current Outpatient Prescriptions on File Prior to Visit  Medication Sig Dispense Refill  . albuterol (PROVENTIL HFA;VENTOLIN HFA) 108 (90 Base) MCG/ACT inhaler Inhale 2 puffs into the lungs every 2 (two) hours as needed for wheezing or shortness of breath (cough). 1 Inhaler 0  . Ascorbic Acid (VITAMIN C) 1000 MG tablet Take 1,000 mg by mouth daily.    Marland Kitchen aspirin EC 81 MG tablet Take 81 mg by mouth daily.    . B Complex Vitamins (B COMPLEX PO) Take by mouth daily.    . bisoprolol-hydrochlorothiazide (ZIAC) 2.5-6.25 MG tablet TAKE 1 TABLET BY MOUTH DAILY FOR HIGH BLOOD PRESSURE AND HEART 90 tablet 1  . Choline Dihydrogen  Citrate (CHOLINE CITRATE) 650 MG TABS Take by mouth.    . clobetasol (TEMOVATE) 0.05 % external solution Apply 1 application topically daily. 50 mL 0  . clopidogrel (PLAVIX) 75 MG tablet TAKE ONE TABLET BY MOUTH DAILY 90 tablet 1  . Coenzyme Q10 (CO Q 10) 100 MG CAPS Take 100 mg by mouth daily.     . diazepam (VALIUM) 5 MG tablet Take 1 tablet (5 mg total) by mouth every 8 (eight) hours as needed for anxiety. 90 tablet 0  . fish oil-omega-3 fatty acids 1000 MG capsule Take 2 g by mouth daily.     . Flaxseed, Linseed, (FLAX SEED OIL) 1000 MG CAPS Take 1,000 mg by mouth 2 (two) times daily.     . fluocinonide (LIDEX) 0.05 % external solution     . folic acid (FOLVITE) 154 MCG tablet Take 800 mcg by mouth daily.    Marland Kitchen ipratropium (ATROVENT) 0.03 % nasal spray 1-2 puffs each nostril every 6 hours if needed, before meals 30 mL 12  . Magnesium 500 MG TABS Take 500 mg by mouth 2 (two) times daily.    Marland Kitchen MELATONIN PO Take 30 mg by mouth daily.    . Multiple Vitamin (MULTIVITAMIN) capsule Take 1 capsule by mouth daily.      . niacin 500 MG tablet Take 500 mg by mouth 2 (two) times daily.    . nitroGLYCERIN (NITROSTAT) 0.4 MG SL tablet DISSOLVE 1 TABLET UNDER TONGUE EVERY 3 TO 5 MINUTES FOR ANGINA 225 tablet 98  . OVER THE COUNTER MEDICATION Takes ALA(alpha lipoic)    . OVER THE COUNTER MEDICATION Takes lecithin 1200 mg 2 times daily.    Marland Kitchen OVER THE COUNTER MEDICATION Valerian 400 mg 3 tabs at night.    . Turmeric 500 MG CAPS Take 500 mg by mouth 4 (four) times daily.      No current facility-administered medications on file prior to visit.     ROS: all negative except above.   Physical Exam: There were no vitals filed for this visit. There were no vitals taken for this visit. General Appearance: Well developed well nourished, non-toxic appearing in no apparent distress. Eyes: PERRLA, EOMs, conjunctiva w/ no swelling or erythema or discharge Sinuses: No Frontal/maxillary tenderness ENT/Mouth: Ear  canals clear without swelling or erythema.  TM's normal bilaterally with no retractions, bulging, or loss of landmarks.  Cracks at bilateral corners of the mouth without exudate present.  Right lower lip with 2 small vessicles with erythematous bases.   Neck: Supple, thyroid  normal, no notable JVD  Respiratory: Respiratory effort normal, Clear breath sounds anteriorly and posteriorly bilaterally without rales, rhonchi, wheezing or stridor. No retractions or accessory muscle usage.    Starlyn Skeans, PA-C 10:25 AM [facility]

## 2016-08-11 ENCOUNTER — Telehealth: Payer: Self-pay | Admitting: Physician Assistant

## 2016-08-11 NOTE — Telephone Encounter (Signed)
New Message    Request for surgical clearance:  1. What type of surgery is being performed? Lumbar epidural   2. When is this surgery scheduled? March 14th  3. Are there any medications that need to be held prior to surgery and how long? Plavix and asprin 1 week prior   4. Name of physician performing surgery? Dr Brien Few  5. What is your office phone and fax number? Fax [phone]  ofc [phone] x [phone]

## 2016-08-12 NOTE — Telephone Encounter (Signed)
Patient needs to be seen before we can clear him for surgery. Also needs new cardiologist

## 2016-08-15 ENCOUNTER — Encounter: Payer: Self-pay | Admitting: Physician Assistant

## 2016-08-15 ENCOUNTER — Ambulatory Visit (INDEPENDENT_AMBULATORY_CARE_PROVIDER_SITE_OTHER): Payer: Medicare Other | Admitting: Physician Assistant

## 2016-08-15 VITALS — BP 160/90 | HR 70 | Ht 67.0 in | Wt 187.8 lb

## 2016-08-15 DIAGNOSIS — I10 Essential (primary) hypertension: Secondary | ICD-10-CM | POA: Diagnosis not present

## 2016-08-15 DIAGNOSIS — I25810 Atherosclerosis of coronary artery bypass graft(s) without angina pectoris: Secondary | ICD-10-CM

## 2016-08-15 DIAGNOSIS — Z01818 Encounter for other preprocedural examination: Secondary | ICD-10-CM

## 2016-08-15 DIAGNOSIS — E782 Mixed hyperlipidemia: Secondary | ICD-10-CM

## 2016-08-15 MED ORDER — BISOPROLOL-HYDROCHLOROTHIAZIDE 5-6.25 MG PO TABS: 1.0000 | ORAL_TABLET | Freq: Every day | ORAL | 1 refills | Status: DC

## 2016-08-15 NOTE — Patient Instructions (Addendum)
Medication Instructions:  Your physician has recommended you make the following change in your medication:  1.  INCREASE the Bisoprolol to 5/6.25 taking 1 tablet daily   Labwork: NONE ORDERED  Testing/Procedures: NONE ORDERED  Follow-Up: Your physician wants you to follow-up in: 6 MONTHS WITH DR. Johann Capers will receive a reminder letter in the mail two months in advance. If you don't receive a letter, please call our office to schedule the follow-up appointment.    Any Other Special Instructions Will Be Listed Below (If Applicable).     If you need a refill on your cardiac medications before your next appointment, please call your pharmacy.

## 2016-08-15 NOTE — Progress Notes (Signed)
Cardiology Office Note    Date:  08/15/2016   ID:  Brecken, Dewoody 07-30-1945, MRN [id number]  PCP:  Alesia Richards, MD  Cardiologist: Dr. Aundra Dubin- to be established with Dr. Meda Coffee  Chief Complaint  Patient presents with  . Pre-op Exam    History of Present Illness:  [name] is a 71 y.o. male has a history of CAD, HTN, hyperlipidemia presents for followup.  He has had multiple stents in his LAD and RCA.  Last cath in 7/12 showed a totally occluded CFX (known from prior caths) with collaterals from the RCA.  There was no other significant coronary disease and EF was 55%.  He is unable to tolerate statins.   I last saw the patient 11/2014 for follow-up. Main complaint at that time was fatigue.  Patient comes in today for clearance before undergoing lumbar epidural. This is actually improved by Dr. Algernon Huxley in the past but he didn't have it done. He has to hold his Plavix for 5-7 days prior to the injection. He denies any chest tightness, pressure, dizziness or presyncope. He is frustrated that he can exercise is low back pain. He says he gets sore in his back and his chest when he can exercise. He has to rub his chest and back to make it feel better. He says he has no pain like his previous angina. When his back isn't hurting he goes to the gym almost every day.       Past Medical History:  Diagnosis Date  . Anxiety    treated /w Xanax for mild depression , using 2 times per day, not PRN  . Asthma   . Benign prostatic hypertrophy   . CAD (coronary artery disease)    pt last left heart cath was in Nov 2008. EF was 55% on left ventriculogram. Circumflex was totally occluded after the 1st obtuse marginal with collaterals supplying the distal circumflex. LAD showed luminal irregularities. The stent in LAD was patent. The RCA showed luminal irregularities. The stent in th emid RCA and PDA were patent. There were no inverventions.   . Cancer ([place])    basal cell-  face & head  . Chronic obstructive pulmonary disease (HCC)    asthma  . Cluster headache    relative to sinus problems   . Cluster headache   . Depression   . DM2 (diabetes mellitus, type 2) ([place])    well controlled  . GERD (gastroesophageal reflux disease)    hiatal hernia. Patient did hav ea Nissen fundoplication  . History of blood transfusion    need for Bld. transfusion relative to taking NSAIDS  . HTN (hypertension)    pt. followed by [facility], last cardiac visit 2012, one yr. ago  . Low back pain    chronic  . 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 Obesity   . Osteoarthritis   . Peptic ulcer disease   . Sleep concern    states the study (2003 )was done at [facility]., it failed & he was told to return & he never did. Pt. states he has been found by prev. hosp. staff that he has to be told when to breathe & his wife does the same.   Marland Kitchen Spinal fracture    hx of traumatic in Jan 2009 after falling off the roof    Past Surgical History:  Procedure Laterality Date  .  BACK SURGERY     x3- last surgery lumbar- 1998- / fusion   . blepheroplasty     both eyes  . C5-T6 posterior fusion     with Oasis and radius screws  . CARDIAC CATHETERIZATION     2008 & 2012 multiple stents    . cataracts     cataracts removed- /w IOL - both eyes   . ESOPHAGEAL MANOMETRY    . EYE SURGERY    . HARDWARE REMOVAL  02/20/2012   Procedure: HARDWARE REMOVAL;  Surgeon: [name], MD;  Location: [facility];  Service: Neurosurgery;  Laterality: N/A;  Hardware Removal  . HIATAL HERNIA REPAIR    . LAPAROSCOPIC CHOLECYSTECTOMY     & IOC  . LUMBAR SPINE SURGERY     x4  . multiple percutaneous coronary interventions    . NASAL SINUS SURGERY     x2, sees Dr. [name], still having problems, states he uses Benadryl PRN- up to 5 times per day   . right carpal tunnel release    . right temporal artery biopsy    . UMBILICAL HERNIA  REPAIR      Current Medications: Outpatient Medications Prior to Visit  Medication Sig Dispense Refill  . Ascorbic Acid (VITAMIN C) 1000 MG tablet Take 1,000 mg by mouth daily.    Marland Kitchen aspirin EC 81 MG tablet Take 81 mg by mouth daily.    . B Complex Vitamins (B COMPLEX PO) Take by mouth daily.    . Choline Dihydrogen Citrate (CHOLINE CITRATE) 650 MG TABS Take by mouth.    . clobetasol (TEMOVATE) 0.05 % external solution Apply 1 application topically daily. 50 mL 0  . clopidogrel (PLAVIX) 75 MG tablet TAKE ONE TABLET BY MOUTH DAILY 90 tablet 1  . diazepam (VALIUM) 5 MG tablet Take 1 tablet (5 mg total) by mouth every 8 (eight) hours as needed for anxiety. 90 tablet 0  . fish oil-omega-3 fatty acids 1000 MG capsule Take 2 g by mouth daily.     . Flaxseed, Linseed, (FLAX SEED OIL) 1000 MG CAPS Take 1,000 mg by mouth 2 (two) times daily.     . fluocinonide (LIDEX) 0.05 % external solution Apply 1 application topically 2 (two) times daily.     . folic acid (FOLVITE) 379 MCG tablet Take 800 mcg by mouth daily.    . Magnesium 500 MG TABS Take 500 mg by mouth 2 (two) times daily.    Marland Kitchen MELATONIN PO Take 30 mg by mouth daily.    . Multiple Vitamin (MULTIVITAMIN) capsule Take 1 capsule by mouth daily.      . niacin 500 MG tablet Take 500 mg by mouth 2 (two) times daily.    . nitroGLYCERIN (NITROSTAT) 0.4 MG SL tablet DISSOLVE 1 TABLET UNDER TONGUE EVERY 3 TO 5 MINUTES FOR ANGINA 225 tablet 98  . OVER THE COUNTER MEDICATION Takes ALA(alpha lipoic)    . OVER THE COUNTER MEDICATION Takes lecithin 1200 mg 2 times daily.    Marland Kitchen OVER THE COUNTER MEDICATION Valerian 400 mg 3 tabs at night.    . Turmeric 500 MG CAPS Take 500 mg by mouth 4 (four) times daily.     . bisoprolol-hydrochlorothiazide (ZIAC) 2.5-6.25 MG tablet TAKE 1 TABLET BY MOUTH DAILY FOR HIGH BLOOD PRESSURE AND HEART 90 tablet 1  . Coenzyme Q10 (CO Q 10) 100 MG CAPS Take 100 mg by mouth daily.     Marland Kitchen ipratropium (ATROVENT) 0.03 % nasal spray 1-2  puffs each nostril every 6 hours if needed, before meals 30 mL 12  . lidocaine (XYLOCAINE) 2 % solution Use as directed 20 mLs in the mouth or throat as needed for mouth pain. 100 mL 0  . nystatin cream (MYCOSTATIN) Apply 1 application topically 2 (two) times daily. Apply to affected area every 4-6 hours x 10 days 30 g 0   No facility-administered medications prior to visit.      Allergies:   Codeine; Morphine and related; Nsaids; Crestor [rosuvastatin]; Cymbalta [duloxetine hcl]; Dilaudid [hydromorphone hcl]; Effexor [venlafaxine]; Gluten meal; Percocet [oxycodone-acetaminophen]; Sulfa antibiotics; Topamax [topiramate]; Trazodone and nefazodone; Adhesive [tape]; and Penicillins   Social History   Social History  . Marital status: Married    Spouse name: N/A  . Number of children: N/A  . Years of education: N/A   Social History Main Topics  . Smoking status: Former Smoker    Quit date: 02/14/1978  . Smokeless tobacco: Never Used  . Alcohol use 0.0 oz/week     Comment: rare  . Drug use: No  . Sexual activity: Not Asked   Other Topics Concern  . None   Social History Narrative  . None     Family History:  The patient's family history includes Coronary artery disease in his father; Gout in his sister; Heart attack in his brother and brother; Heart failure in his mother.   ROS:   Please see the history of present illness.    Review of Systems  Constitution: Negative.  HENT: Negative.   Cardiovascular: Negative.   Respiratory: Negative.   Endocrine: Negative.   Hematologic/Lymphatic: Negative.   Musculoskeletal: Positive for back pain.  Gastrointestinal: Negative.   Genitourinary: Negative.   Neurological: Negative.    All other systems reviewed and are negative.   PHYSICAL EXAM:   VS:  BP (!) 160/90   Pulse 70   Ht '5\' 7"'$  (1.702 m)   Wt 187 lb 12.8 oz (85.2 kg)   SpO2 98%   BMI 29.41 kg/m   Physical Exam  GEN: Well nourished, well developed, in no acute distress    Neck: no JVD, carotid bruits, or masses Cardiac:RRR; Positive S4, no murmurs, rubs,  Respiratory:  clear to auscultation bilaterally, normal work of breathing GI: soft, nontender, nondistended, + BS Ext: without cyanosis, clubbing, or edema, Good distal pulses bilaterally Psych: euthymic mood, full affect  Wt Readings from Last 3 Encounters:  08/15/16 187 lb 12.8 oz (85.2 kg)  08/01/16 198 lb (89.8 kg)  07/12/16 198 lb (89.8 kg)      Studies/Labs Reviewed:   EKG:  EKG is  ordered today.  The ekg ordered today demonstratesNormal sinus rhythm nonspecific ST-T wave changes, no acute change  Recent Labs: 07/12/2016: ALT 16; BUN 18; Creat 1.19; Hemoglobin 16.8; Magnesium 1.9; Platelets 160; Potassium 4.4; Sodium 137; TSH 1.53   Lipid Panel    Component Value Date/Time   CHOL 184 07/12/2016 1419   TRIG 395 (H) 07/12/2016 1419   HDL 32 (L) 07/12/2016 1419   CHOLHDL 5.8 (H) 07/12/2016 1419   VLDL 79 (H) 07/12/2016 1419   LDLCALC 73 07/12/2016 1419   LDLDIRECT 116.0 06/03/2013 1513    Additional studies/ records that were reviewed today include:   FINDINGS: 1. Hemodynamics:  LV 118/18, aorta 127/70. 2. Left ventriculography:  EF was estimated to be 55%.  There were no     regional wall motion abnormalities in the RAO projection. 3. Right coronary artery:  The right coronary was  a dominant vessel.     There was a proximal-to-mid right coronary artery stent.  There was     about 25% in-stent restenosis.  There was about 30% distal RCA     stenosis.  The PLV and PDA were both moderate-sized vessels.  There     was about 40-50% ostial PLV stenosis.  The stent in the PDA was     patent.  There were luminal irregularities diffusely in the PDA.     The PLV did provide right-to-left collaterals to the distal     circumflex and also supplied 2 small-to-moderate obtuse marginals. 4. Left main:  The left main had no angiographic coronary disease. 5. Left circumflex system:  The left  circumflex was totally occluded     after the first obtuse marginal.  This was known from prior heart     catheterizations.  The first obtuse marginal was a small-to-     moderate vessel with luminal irregularities.  The distal circumflex     was reconstituted via collaterals in the PLV.  There were 2 small-     to-moderate obtuse marginals filled via the collaterals. 6. LAD system:  There was a proximal LAD stent with 30-40% in-stent     restenosis.  The remainder of the LAD had luminal irregularities.     There was a small first diagonal.  There was a moderate-sized     second diagonal that had about 40% ostial stenosis.   IMPRESSION:  The patient has a known occluded circumflex which was seen again today.  There were good collaterals from the right coronary artery system.  The patient's other vessels have no significant disease.  His stents were patent.  We will plan on medical treatment for now.  He will try Prilosec for possible reflux symptoms.     Loralie Champagne, MD DM/MEDQ  D:  01/07/2011  T:  01/07/2011  Job:  [id number]      ASSESSMENT:    1. Preoperative clearance   2. Essential hypertension   3. Atherosclerosis of coronary artery of native heart without angina pectoris   4. Hyperlipidemia      PLAN:  In order of problems listed above:  Preoperative clearance before undergoing lumbar epidural for back pain. Patient has no new cardiac symptoms since he was last here. Blood pressure is elevated today. Increase Ziac 5-12 0.5 mg daily. Can hold Plavix and aspirin 5-7 days prior to epidural. Resume after. For long-term cardiac care. She also follows his wife.  Essential hypertension blood pressure elevated today may be in part due to his back pain. Increase Ziac as above  CAD without angina stable continue Plavix, aspirin  Hyperlipidemia on niacin    Medication Adjustments/Labs and Tests Ordered: Current medicines are reviewed at length with the patient today.   Concerns regarding medicines are outlined above.  Medication changes, Labs and Tests ordered today are listed in the Patient Instructions below. Patient Instructions  Medication Instructions:  Your physician has recommended you make the following change in your medication:  1.  INCREASE the Bisoprolol to 5/6.25 taking 1 tablet daily   Labwork: NONE ORDERED  Testing/Procedures: NONE ORDERED  Follow-Up: Your physician wants you to follow-up in: 6 MONTHS WITH DR. Johann Capers will receive a reminder letter in the mail two months in advance. If you don't receive a letter, please call our office to schedule the follow-up appointment.    Any Other Special Instructions Will Be Listed Below (If Applicable).  If you need a refill on your cardiac medications before your next appointment, please call your pharmacy.      Signed, Ermalinda Barrios, PA-C  08/15/2016 2:08 PM    [facility] [address], [place], [place]  [address] Phone: [phone]; Fax: [phone]

## 2016-08-15 NOTE — Telephone Encounter (Signed)
Returned pts call.  Left a message for pt that he will need a f/u appt before he can get cleared, per Ermalinda Barrios, PA-C.

## 2016-08-24 DIAGNOSIS — M5416 Radiculopathy, lumbar region: Secondary | ICD-10-CM | POA: Diagnosis not present

## 2016-08-31 ENCOUNTER — Ambulatory Visit (INDEPENDENT_AMBULATORY_CARE_PROVIDER_SITE_OTHER): Payer: Medicare Other | Admitting: Orthopaedic Surgery

## 2016-08-31 DIAGNOSIS — M25551 Pain in right hip: Secondary | ICD-10-CM | POA: Diagnosis not present

## 2016-08-31 DIAGNOSIS — I25810 Atherosclerosis of coronary artery bypass graft(s) without angina pectoris: Secondary | ICD-10-CM

## 2016-08-31 DIAGNOSIS — M25561 Pain in right knee: Secondary | ICD-10-CM | POA: Diagnosis not present

## 2016-08-31 MED ORDER — METHYLPREDNISOLONE 4 MG PO TABS: ORAL_TABLET | ORAL | 0 refills | Status: DC

## 2016-08-31 NOTE — Progress Notes (Signed)
The patient is well-known to me. He had a history of her right shoulder arthroscopy with extensive debridement and subacromial decompression back in October he said this done great he just gets some soreness at night. He is recently had a flareup of what he thought was potentially gout and he took some of his wife's medication for this helped. He is pain-free today but he had a history of right hip pain and right knee pain. This is since dissipated. He is a diabetic which is type II but he is under good control. He had some type of low back epidural steroid injection last week by another physician in town. Today he is doing well overall.  On examination of his right shoulder does seem to move fluidly without any issues at all. His right hip and right knee exams are entirely normal today as well.  Review of systems are negative for any active medical problems.. Negative for headache, chest pain, shortness of breath, fever, chills, nausea, vomiting.  At this point is doing well. I will send in a steroid taper for him to take things flare up. I was in a try indomethacin however his insurance, doesn't cover this. As far as her workup of gout have recommended he see his primary care physician for blood workup if that is indicated.

## 2016-09-15 ENCOUNTER — Encounter: Payer: Self-pay | Admitting: Internal Medicine

## 2016-09-15 ENCOUNTER — Ambulatory Visit (INDEPENDENT_AMBULATORY_CARE_PROVIDER_SITE_OTHER): Payer: Medicare Other | Admitting: Internal Medicine

## 2016-09-15 VITALS — BP 142/70 | HR 68 | Temp 98.2°F | Resp 16 | Ht 67.0 in | Wt 196.0 lb

## 2016-09-15 DIAGNOSIS — G8929 Other chronic pain: Secondary | ICD-10-CM

## 2016-09-15 DIAGNOSIS — M2550 Pain in unspecified joint: Secondary | ICD-10-CM | POA: Diagnosis not present

## 2016-09-15 DIAGNOSIS — M5441 Lumbago with sciatica, right side: Secondary | ICD-10-CM

## 2016-09-15 MED ORDER — METHYLPREDNISOLONE 4 MG PO TABS: ORAL_TABLET | ORAL | 0 refills | Status: DC

## 2016-09-15 MED ORDER — TRAMADOL HCL 50 MG PO TABS: 50.0000 mg | ORAL_TABLET | Freq: Four times a day (QID) | ORAL | 0 refills | Status: DC | PRN

## 2016-09-15 NOTE — Progress Notes (Signed)
Assessment and Plan:   1. Arthralgia, unspecified joint -patient with chronic low back pain with chronic spinal stenosis and post surgical degenerative changes.  No evidence for cauda equina or infection at this time.  Patient is followed by Dr. Saintclair Halsted neurosurgeon and has not been back recently.  Feel that he likely needs either PT vs. Another injection.  Doubt that this is caused by gout.  This is likely OA.  -tramadol as needed for pain.   -full valium tablet at bedtime -take medrol dose pak -will try to get him in to Dr. Saintclair Halsted - Uric acid  2. Chronic midline low back pain with right-sided sciatica  - Ambulatory referral to Neurosurgery    HPI 71 y.o.male presents for low back pain.  He reports that he is having low back pain that keeps him from getting comfortable.  He has a history of 3 lumbar surgeries.  He has seen orthopedics and neurosurgery.  He reports that he did have an injection 1 year ago and that was very helpful.  He reports that he had another shot a month ago with Dr. Assunta Curtis and this was not as effective.  He reports that he also saw Dr. Ninfa Linden who saw him for his hip and knee.  He was given a steroid taper.  He reports that he has been taking meloxicam and her gout medication.  He reports that this also helped.  He also changed his diet and cut back on meat intake.  He reports that the pain has not been improving.  He reports that the arthritis is not responding to meloxicam anymore.  He reports that after a while he feels like he is getting some numbness that is going down the right leg.  He wants to see whether it is gout today.  He reports that he has not been back to see Dr. Saintclair Halsted since 2016.  He reported that he would likely need surgery if he has no relief. He has no loss of bowel or bladder, no CP, no SOB, no urinary changes, no falls, no saddle anesthesias.  He had CT myelograms done in 2016 which showed degenerative changes and post surgical changes with spinal stenosis.   He did not take the medrol dosepak that Dr. Ninfa Linden gave him.  He does not want to take a full tablet of valium as it made him groggy, but he is just not sleeping.         Past Medical History:  Diagnosis Date  . Anxiety    treated /w Xanax for mild depression , using 2 times per day, not PRN  . Asthma   . Benign prostatic hypertrophy   . CAD (coronary artery disease)    pt last left heart cath was in Nov 2008. EF was 55% on left ventriculogram. Circumflex was totally occluded after the 1st obtuse marginal with collaterals supplying the distal circumflex. LAD showed luminal irregularities. The stent in LAD was patent. The RCA showed luminal irregularities. The stent in th emid RCA and PDA were patent. There were no inverventions.   . Cancer ([place])    basal cell- face & head  . Chronic obstructive pulmonary disease (HCC)    asthma  . Cluster headache    relative to sinus problems   . Cluster headache   . Depression   . DM2 (diabetes mellitus, type 2) ([place])    well controlled  . GERD (gastroesophageal reflux disease)    hiatal hernia. Patient did hav ea Nissen fundoplication  .  History of blood transfusion    need for Bld. transfusion relative to taking NSAIDS  . HTN (hypertension)    pt. followed by [facility], last cardiac visit 2012, one yr. ago  . Low back pain    chronic  . 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 Obesity   . Osteoarthritis   . Peptic ulcer disease   . Sleep concern    states the study (2003 )was done at [facility]., it failed & he was told to return & he never did. Pt. states he has been found by prev. hosp. staff that he has to be told when to breathe & his wife does the same.   Marland Kitchen Spinal fracture    hx of traumatic in Jan 2009 after falling off the roof     Allergies  Allergen Reactions  . Codeine Itching and Other (See Comments)    Also hallucinations   . Morphine And Related Other  (See Comments)    Hallucinations  . Nsaids Other (See Comments)    BLEEDING  . Crestor [Rosuvastatin]   . Cymbalta [Duloxetine Hcl]   . Dilaudid [Hydromorphone Hcl]   . Effexor [Venlafaxine]   . Gluten Meal   . Percocet [Oxycodone-Acetaminophen]   . Sulfa Antibiotics   . Topamax [Topiramate]   . Trazodone And Nefazodone   . Adhesive [Tape] Other (See Comments)    SKIN PEELS  . Penicillins Rash      Current Outpatient Prescriptions on File Prior to Visit  Medication Sig Dispense Refill  . Ascorbic Acid (VITAMIN C) 1000 MG tablet Take 1,000 mg by mouth daily.    Marland Kitchen aspirin EC 81 MG tablet Take 81 mg by mouth daily.    . B Complex Vitamins (B COMPLEX PO) Take by mouth daily.    . bisoprolol-hydrochlorothiazide (ZIAC) 5-6.25 MG tablet Take 1 tablet by mouth daily. 90 tablet 1  . Choline Dihydrogen Citrate (CHOLINE CITRATE) 650 MG TABS Take by mouth.    . clobetasol (TEMOVATE) 0.05 % external solution Apply 1 application topically daily. 50 mL 0  . clopidogrel (PLAVIX) 75 MG tablet TAKE ONE TABLET BY MOUTH DAILY 90 tablet 1  . diazepam (VALIUM) 5 MG tablet Take 1 tablet (5 mg total) by mouth every 8 (eight) hours as needed for anxiety. 90 tablet 0  . fish oil-omega-3 fatty acids 1000 MG capsule Take 2 g by mouth daily.     . Flaxseed, Linseed, (FLAX SEED OIL) 1000 MG CAPS Take 1,000 mg by mouth 2 (two) times daily.     . fluocinonide (LIDEX) 0.05 % external solution Apply 1 application topically 2 (two) times daily.     . folic acid (FOLVITE) 607 MCG tablet Take 800 mcg by mouth daily.    . Magnesium 500 MG TABS Take 500 mg by mouth 2 (two) times daily.    Marland Kitchen MELATONIN PO Take 30 mg by mouth daily.    . Multiple Vitamin (MULTIVITAMIN) capsule Take 1 capsule by mouth daily.      . niacin 500 MG tablet Take 500 mg by mouth 2 (two) times daily.    . nitroGLYCERIN (NITROSTAT) 0.4 MG SL tablet DISSOLVE 1 TABLET UNDER TONGUE EVERY 3 TO 5 MINUTES FOR ANGINA 225 tablet 98  . OVER THE COUNTER  MEDICATION Takes ALA(alpha lipoic)    . OVER THE COUNTER MEDICATION Takes lecithin 1200 mg 2 times daily.    Marland Kitchen  OVER THE COUNTER MEDICATION Valerian 400 mg 3 tabs at night.    . Turmeric 500 MG CAPS Take 500 mg by mouth 4 (four) times daily.      No current facility-administered medications on file prior to visit.     ROS: all negative except above.   Physical Exam: Filed Weights   09/15/16 0927  Weight: 196 lb (88.9 kg)   BP (!) 142/70   Pulse 68   Temp 98.2 F (36.8 C) (Temporal)   Resp 16   Ht '5\' 7"'$  (1.702 m)   Wt 196 lb (88.9 kg)   BMI 30.70 kg/m  General Appearance: Well developed well nourished, non-toxic appearing in no apparent distress. Eyes: PERRLA, EOMs, conjunctiva w/ no swelling or erythema or discharge Sinuses: No Frontal/maxillary tenderness ENT/Mouth: Ear canals clear without swelling or erythema.  TM's normal bilaterally with no retractions, bulging, or loss of landmarks.   Neck: Supple, thyroid normal, no notable JVD  Respiratory: Respiratory effort normal, Clear breath sounds anteriorly and posteriorly bilaterally without rales, rhonchi, wheezing or stridor. No retractions or accessory muscle usage. Cardio: RRR with no MRGs.   Abdomen: Soft, + BS.  Non tender, no guarding, rebound, hernias, masses.  Musculoskeletal: Full ROM, 5/5 strength, normal gait.  Skin: Warm, dry without rashes  Neuro: Awake and oriented X 3, Cranial nerves intact. Normal muscle tone, no cerebellar symptoms. Sensation intact.  Psych: normal affect, Insight and Judgment appropriate.     Starlyn Skeans, PA-C 9:38 AM [facility]

## 2016-09-15 NOTE — Patient Instructions (Signed)
Please take the medrol dose pak in its entireity.   You can take tramadol every 6-8 hours as needed for pain and discomfort.  Please try to use a heating pad as often as tolerated for pain.  Continue to walk around and do light activity.  Avoid forward bent over activities and twisting activity.  We are checking for gout today but I doubt that this is the actual diagnosis.  Please take a full tablet of valium at bedtime to help with sleep.  Katrina will call about trying to get you in to see Dr. Saintclair Halsted.

## 2016-09-16 ENCOUNTER — Other Ambulatory Visit: Payer: Self-pay | Admitting: Internal Medicine

## 2016-09-16 LAB — URIC ACID: Uric Acid, Serum: 9.5 mg/dL — ABNORMAL HIGH (ref 4.0–8.0)

## 2016-09-16 MED ORDER — ALLOPURINOL 300 MG PO TABS: 300.0000 mg | ORAL_TABLET | Freq: Every day | ORAL | 0 refills | Status: DC

## 2016-09-20 ENCOUNTER — Telehealth: Payer: Self-pay | Admitting: Internal Medicine

## 2016-09-20 NOTE — Telephone Encounter (Signed)
Patient states, Predinsone worked quickly and very well, but  pain returned as soon as he stopped taking medicine. Please advise your recommendation.

## 2016-09-26 DIAGNOSIS — M4316 Spondylolisthesis, lumbar region: Secondary | ICD-10-CM | POA: Diagnosis not present

## 2016-09-26 DIAGNOSIS — M5137 Other intervertebral disc degeneration, lumbosacral region: Secondary | ICD-10-CM | POA: Diagnosis not present

## 2016-09-29 DIAGNOSIS — M545 Low back pain: Secondary | ICD-10-CM | POA: Diagnosis not present

## 2016-09-29 DIAGNOSIS — M4316 Spondylolisthesis, lumbar region: Secondary | ICD-10-CM | POA: Diagnosis not present

## 2016-10-04 DIAGNOSIS — M4316 Spondylolisthesis, lumbar region: Secondary | ICD-10-CM | POA: Diagnosis not present

## 2016-10-04 DIAGNOSIS — M5137 Other intervertebral disc degeneration, lumbosacral region: Secondary | ICD-10-CM | POA: Diagnosis not present

## 2016-10-06 DIAGNOSIS — Z961 Presence of intraocular lens: Secondary | ICD-10-CM | POA: Diagnosis not present

## 2016-10-06 DIAGNOSIS — H1131 Conjunctival hemorrhage, right eye: Secondary | ICD-10-CM | POA: Diagnosis not present

## 2016-10-09 NOTE — Progress Notes (Signed)
This very nice 71 y.o. MWM presents for 6 month follow up with Hypertension, Hyperlipidemia, Pre-Diabetes and Vitamin D Deficiency. Patient also has Gout apparently controlled on meds.      Patient is treated for HTN (1999) and in 19092 had PCA/ Stents x 2 and was sent by Dr Wynonia Lawman to [facility] for Laser Angioplasty. Heart cath in 2012 showed patent stents. BP has been controlled at home. Today's BP is at goal - 124/78. Patient has had no complaints of any cardiac type chest pain, palpitations, dyspnea/orthopnea/PND, dizziness, claudication, or dependent edema.      Hyperlipidemia is not controlled with diet & supplements (Intolerant to Crestor). Patient denies myalgias or other med SE's. Last Lipids were not at goal: Lab Results  Component Value Date   CHOL 184 07/12/2016   HDL 32 (L) 07/12/2016   LDLCALC 73 07/12/2016   LDLDIRECT 116.0 06/03/2013   TRIG 395 (H) 07/12/2016   CHOLHDL 5.8 (H) 07/12/2016      Also, the patient has history of T2_NIDDM (2012), then with dieting/weight loss A1c dropped to 5.8% in 2013 and then 5.5% in 2014.  Patient denies  symptoms of reactive hypoglycemia, diabetic polys, paresthesias or visual blurring.  Last A1c was still at goal: Lab Results  Component Value Date   HGBA1C 5.4 07/12/2016      Further, the patient also has history of Vitamin D Deficiency ("45" in 2008) and supplements vitamin D without any suspected side-effects. Last vitamin D was   Lab Results  Component Value Date   VD25OH 97 04/04/2016   Current Outpatient Prescriptions on File Prior to Visit  Medication Sig  . allopurinol  300 MG Take 1 tablet (300 mg total) by mouth daily.  Marland Kitchen VITAMIN C 1000 MG Take 1,000 mg by mouth daily.  Marland Kitchen aspirin EC 81 MG  Take 81 mg by mouth daily.  . B COMPLEX  Take by mouth daily.  . bisoprolol-hctz 5-6.25  Take 1 tablet by mouth daily.  . CHOLINE CITRATE 650 MG  Take by mouth.  . TEMOVATE 0.05 % ext soln Apply 1 application topically daily.  .  clopidogrel  75 MG TAKE ONE TABLET BY MOUTH DAILY  . diazepam  5 MG  Take 1 tab every 8  hrs as needed  . fish oil-omega-3  1000 MG  Take 2 g by mouth daily.   Marland Kitchen FLAX SEED OIL 1000 MG Take 1,000 mg by mouth 2 (two) times daily.   Marland Kitchen LIDEX 0.05 % ext soln Apply 1 application topically 2 (two) times daily.   . folic acid  756 MCG Take 800 mcg by mouth daily.  . Magnesium 500 MG  Take 500 mg by mouth 2 (two) times daily.  Marland Kitchen MELATONIN  Take 30 mg by mouth daily.  . Multiple Vitamin  Take 1 capsule by mouth daily.    . niacin 500 MG Take  2 times daily.  Marland Kitchen NITROSTAT 0.4 MG SL  As needed  . (alpha lipoic) Takes ALA  . OTC lecithin 1200 mg Takes  2 times daily.  . OTC  Valerian 400 mg  3 tabs at night.  . traMADol  50 MG  Take 1 tab every 6 (six) hours as needed.  . Turmeric 500 MG  Take  4 x daily.    Allergies  Allergen Reactions  . Codeine Itching and Other (See Comments)    Also hallucinations   . Morphine And Related Other (See Comments)  Hallucinations  . Nsaids Other (See Comments)    BLEEDING  . Crestor [Rosuvastatin]   . Cymbalta [Duloxetine Hcl]   . Dilaudid [Hydromorphone Hcl]   . Effexor [Venlafaxine]   . Gluten Meal   . Percocet [Oxycodone-Acetaminophen]   . Sulfa Antibiotics   . Topamax [Topiramate]   . Trazodone And Nefazodone   . Adhesive [Tape] Other (See Comments)    SKIN PEELS  . Penicillins Rash   PMHx:   Past Medical History:  Diagnosis Date  . Anxiety    treated /w Xanax for mild depression , using 2 times per day, not PRN  . Asthma   . Benign prostatic hypertrophy   . CAD (coronary artery disease)    pt last left heart cath was in Nov 2008. EF was 55% on left ventriculogram. Circumflex was totally occluded after the 1st obtuse marginal with collaterals supplying the distal circumflex. LAD showed luminal irregularities. The stent in LAD was patent. The RCA showed luminal irregularities. The stent in th emid RCA and PDA were patent. There were no  inverventions.   . Cancer ([place])    basal cell- face & head  . Chronic obstructive pulmonary disease (HCC)    asthma  . Cluster headache    relative to sinus problems   . Cluster headache   . Depression   . DM2 (diabetes mellitus, type 2) ([place])    well controlled  . GERD (gastroesophageal reflux disease)    hiatal hernia. Patient did hav ea Nissen fundoplication  . History of blood transfusion    need for Bld. transfusion relative to taking NSAIDS  . HTN (hypertension)    pt. followed by [facility], last cardiac visit 2012, one yr. ago  . Low back pain    chronic  . 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 Obesity   . Osteoarthritis   . Peptic ulcer disease   . Sleep concern    states the study (2003 )was done at [facility]., it failed & he was told to return & he never did. Pt. states he has been found by prev. hosp. staff that he has to be told when to breathe & his wife does the same.   Marland Kitchen Spinal fracture    hx of traumatic in Jan 2009 after falling off the roof   Immunization History  Administered Date(s) Administered  . DT 12/09/2014  . Influenza Split 02/17/2012  . Influenza, High Dose Seasonal PF 03/03/2014, 03/11/2015, 04/04/2016  . Pneumococcal Conjugate-13 12/25/2014  . Pneumococcal Polysaccharide-23 01/19/2012  . Pneumococcal-Unspecified 06/13/1997  . Td 06/13/2004  . Zoster 06/14/2007   Past Surgical History:  Procedure Laterality Date  . BACK SURGERY     x3- last surgery lumbar- 1998- / fusion   . blepheroplasty     both eyes  . C5-T6 posterior fusion     with Oasis and radius screws  . CARDIAC CATHETERIZATION     2008 & 2012 multiple stents    . cataracts     cataracts removed- /w IOL - both eyes   . ESOPHAGEAL MANOMETRY    . EYE SURGERY    . HARDWARE REMOVAL  02/20/2012   Procedure: HARDWARE REMOVAL;  Surgeon: [name], MD;  Location: [facility];  Service: Neurosurgery;   Laterality: N/A;  Hardware Removal  . HIATAL HERNIA REPAIR    . LAPAROSCOPIC CHOLECYSTECTOMY     & IOC  .  LUMBAR SPINE SURGERY     x4  . multiple percutaneous coronary interventions    . NASAL SINUS SURGERY     x2, sees Dr. [name], still having problems, states he uses Benadryl PRN- up to 5 times per day   . right carpal tunnel release    . right temporal artery biopsy    . UMBILICAL HERNIA REPAIR     FHx:    Reviewed / unchanged  SHx:    Reviewed / unchanged  Systems Review:  Constitutional: Denies fever, chills, wt changes, headaches, insomnia, fatigue, night sweats, change in appetite. Eyes: Denies redness, blurred vision, diplopia, discharge, itchy, watery eyes.  ENT: Denies discharge, congestion, post nasal drip, epistaxis, sore throat, earache, hearing loss, dental pain, tinnitus, vertigo, sinus pain, snoring.  CV: Denies chest pain, palpitations, irregular heartbeat, syncope, dyspnea, diaphoresis, orthopnea, PND, claudication or edema. Respiratory: denies cough, dyspnea, DOE, pleurisy, hoarseness, laryngitis, wheezing.  Gastrointestinal: Denies dysphagia, odynophagia, heartburn, reflux, water brash, abdominal pain or cramps, nausea, vomiting, bloating, diarrhea, constipation, hematemesis, melena, hematochezia  or hemorrhoids. Genitourinary: Denies dysuria, frequency, urgency, nocturia, hesitancy, discharge, hematuria or flank pain. Musculoskeletal: Denies arthralgias, myalgias, stiffness, jt. swelling, pain, limping or strain/sprain.  Skin: Denies pruritus, rash, hives, warts, acne, eczema or change in skin lesion(s). Neuro: No weakness, tremor, incoordination, spasms, paresthesia or pain. Psychiatric: Denies confusion, memory loss or sensory loss. Endo: Denies change in weight, skin or hair change.  Heme/Lymph: No excessive bleeding, bruising or enlarged lymph nodes.  Physical Exam  BP 124/78   Pulse 64   Temp 97.6 F (36.4 C)   Resp 16   Ht '5\' 7"'$  (1.702 m)   Wt 194 lb  (88 kg)   BMI 30.38 kg/m   Appears well nourished, well groomed  and in no distress.  Eyes: PERRLA, EOMs, conjunctiva no swelling or erythema. Sinuses: No frontal/maxillary tenderness ENT/Mouth: EAC's clear, TM's nl w/o erythema, bulging. Nares clear w/o erythema, swelling, exudates. Oropharynx clear without erythema or exudates. Oral hygiene is good. Tongue normal, non obstructing. Hearing intact.  Neck: Supple. Thyroid nl. Car 2+/2+ without bruits, nodes or JVD. Chest: Respirations nl with BS clear & equal w/o rales, rhonchi, wheezing or stridor.  Cor: Heart sounds normal w/ regular rate and rhythm without sig. murmurs, gallops, clicks or rubs. Peripheral pulses normal and equal  without edema.  Abdomen: Soft & bowel sounds normal. Non-tender w/o guarding, rebound, hernias, masses or organomegaly.  Lymphatics: Unremarkable.  Musculoskeletal: Full ROM all peripheral extremities, joint stability, 5/5 strength and normal gait.  Skin: Warm, dry without exposed rashes, lesions or ecchymosis apparent.  Neuro: Cranial nerves intact, reflexes equal bilaterally. Sensory-motor testing grossly intact. Tendon reflexes grossly intact.  Pysch: Alert & oriented x 3.  Insight and judgement nl & appropriate. No ideations.  Assessment and Plan:  1. Essential hypertension  - Continue medication, monitor blood pressure at home.  - Continue DASH diet. Reminder to go to the ER if any CP,  SOB, nausea, dizziness, severe HA, changes vision/speech,  left arm numbness and tingling and jaw pain. - CBC with Differential/Platelet - BASIC METABOLIC PANEL WITH GFR - Magnesium - TSH  2. Hyperlipidemia, mixed  - Continue diet/meds, exercise,& lifestyle modifications.  - Continue monitor periodic cholesterol/liver & renal functions  - Hepatic function panel - Lipid panel - TSH  3. Prediabetes  - Continue diet, exercise, lifestyle modifications.  - Monitor appropriate labs.  - Hemoglobin A1c - Insulin,  random  4. Vitamin D deficiency  - Continue  supplementation.  - VITAMIN D 25 Hydroxy   5. ASCAD s/p CABG  ([place])   6. Gout  - Uric acid  7. Medication management  - CBC with Differential/Platelet - BASIC METABOLIC PANEL WITH GFR - Hepatic function panel - Magnesium - Lipid panel - TSH - Hemoglobin A1c - Insulin, random - VITAMIN D 25 Hydroxy  - Uric acid       Discussed  regular exercise, BP monitoring, weight control to achieve/maintain BMI less than 25 and discussed med and SE's. Recommended labs to assess and monitor clinical status with further disposition pending results of labs. Over 30 minutes of exam, counseling, chart review was performed.

## 2016-10-09 NOTE — Patient Instructions (Signed)

## 2016-10-10 ENCOUNTER — Encounter: Payer: Self-pay | Admitting: Internal Medicine

## 2016-10-10 ENCOUNTER — Ambulatory Visit (INDEPENDENT_AMBULATORY_CARE_PROVIDER_SITE_OTHER): Payer: Medicare Other | Admitting: Internal Medicine

## 2016-10-10 VITALS — BP 124/78 | HR 64 | Temp 97.6°F | Resp 16 | Ht 67.0 in | Wt 194.0 lb

## 2016-10-10 DIAGNOSIS — E782 Mixed hyperlipidemia: Secondary | ICD-10-CM

## 2016-10-10 DIAGNOSIS — R7303 Prediabetes: Secondary | ICD-10-CM

## 2016-10-10 DIAGNOSIS — I25700 Atherosclerosis of coronary artery bypass graft(s), unspecified, with unstable angina pectoris: Secondary | ICD-10-CM | POA: Diagnosis not present

## 2016-10-10 DIAGNOSIS — M109 Gout, unspecified: Secondary | ICD-10-CM

## 2016-10-10 DIAGNOSIS — I10 Essential (primary) hypertension: Secondary | ICD-10-CM

## 2016-10-10 DIAGNOSIS — Z79899 Other long term (current) drug therapy: Secondary | ICD-10-CM | POA: Diagnosis not present

## 2016-10-10 DIAGNOSIS — E559 Vitamin D deficiency, unspecified: Secondary | ICD-10-CM | POA: Diagnosis not present

## 2016-10-11 LAB — HEPATIC FUNCTION PANEL
ALK PHOS: 49 U/L (ref 40–115)
ALT: 15 U/L (ref 9–46)
AST: 20 U/L (ref 10–35)
Albumin: 3.6 g/dL (ref 3.6–5.1)
BILIRUBIN DIRECT: 0.1 mg/dL (ref <=0.2)
BILIRUBIN INDIRECT: 0.4 mg/dL (ref 0.2–1.2)
BILIRUBIN TOTAL: 0.5 mg/dL (ref 0.2–1.2)
TOTAL PROTEIN: 6.4 g/dL (ref 6.1–8.1)

## 2016-10-11 LAB — CBC WITH DIFFERENTIAL/PLATELET
BASOS PCT: 1 %
Basophils Absolute: 86 cells/uL (ref 0–200)
EOS ABS: 86 {cells}/uL (ref 15–500)
Eosinophils Relative: 1 %
HEMATOCRIT: 47.6 % (ref 38.5–50.0)
Hemoglobin: 16 g/dL (ref 13.2–17.1)
LYMPHS ABS: 2408 {cells}/uL (ref 850–3900)
LYMPHS PCT: 28 %
MCH: 30.9 pg (ref 27.0–33.0)
MCHC: 33.6 g/dL (ref 32.0–36.0)
MCV: 92.1 fL (ref 80.0–100.0)
MONO ABS: 774 {cells}/uL (ref 200–950)
MPV: 10.1 fL (ref 7.5–12.5)
Monocytes Relative: 9 %
NEUTROS ABS: 5246 {cells}/uL (ref 1500–7800)
Neutrophils Relative %: 61 %
Platelets: 145 10*3/uL (ref 140–400)
RBC: 5.17 MIL/uL (ref 4.20–5.80)
RDW: 14.6 % (ref 11.0–15.0)
WBC: 8.6 10*3/uL (ref 3.8–10.8)

## 2016-10-11 LAB — BASIC METABOLIC PANEL WITH GFR
BUN: 20 mg/dL (ref 7–25)
CHLORIDE: 99 mmol/L (ref 98–110)
CO2: 28 mmol/L (ref 20–31)
Calcium: 9.2 mg/dL (ref 8.6–10.3)
Creat: 1.21 mg/dL — ABNORMAL HIGH (ref 0.70–1.18)
GFR, EST AFRICAN AMERICAN: 69 mL/min (ref >=60)
GFR, EST NON AFRICAN AMERICAN: 59 mL/min — AB (ref >=60)
GLUCOSE: 79 mg/dL (ref 65–99)
POTASSIUM: 4 mmol/L (ref 3.5–5.3)
Sodium: 137 mmol/L (ref 135–146)

## 2016-10-11 LAB — LIPID PANEL
CHOL/HDL RATIO: 4.1 ratio (ref <5.0)
CHOLESTEROL: 170 mg/dL (ref <200)
HDL: 41 mg/dL (ref >40)
LDL Cholesterol: 94 mg/dL (ref <100)
TRIGLYCERIDES: 177 mg/dL — AB (ref <150)
VLDL: 35 mg/dL — AB (ref <30)

## 2016-10-11 LAB — HEMOGLOBIN A1C
Hgb A1c MFr Bld: 5.3 % (ref <5.7)
MEAN PLASMA GLUCOSE: 105 mg/dL

## 2016-10-11 LAB — URIC ACID: URIC ACID, SERUM: 6.4 mg/dL (ref 4.0–8.0)

## 2016-10-11 LAB — VITAMIN D 25 HYDROXY (VIT D DEFICIENCY, FRACTURES): Vit D, 25-Hydroxy: 94 ng/mL (ref 30–100)

## 2016-10-11 LAB — TSH: TSH: 0.78 mIU/L (ref 0.40–4.50)

## 2016-10-11 LAB — INSULIN, RANDOM: Insulin: 34.1 u[IU]/mL — ABNORMAL HIGH (ref 2.0–19.6)

## 2016-10-11 LAB — MAGNESIUM: Magnesium: 2 mg/dL (ref 1.5–2.5)

## 2016-10-26 DIAGNOSIS — L821 Other seborrheic keratosis: Secondary | ICD-10-CM | POA: Diagnosis not present

## 2016-10-26 DIAGNOSIS — D1801 Hemangioma of skin and subcutaneous tissue: Secondary | ICD-10-CM | POA: Diagnosis not present

## 2016-10-26 DIAGNOSIS — Z85828 Personal history of other malignant neoplasm of skin: Secondary | ICD-10-CM | POA: Diagnosis not present

## 2016-10-26 DIAGNOSIS — L814 Other melanin hyperpigmentation: Secondary | ICD-10-CM | POA: Diagnosis not present

## 2016-12-20 ENCOUNTER — Ambulatory Visit (INDEPENDENT_AMBULATORY_CARE_PROVIDER_SITE_OTHER): Payer: Medicare Other | Admitting: Internal Medicine

## 2016-12-20 ENCOUNTER — Encounter: Payer: Self-pay | Admitting: Internal Medicine

## 2016-12-20 VITALS — BP 140/86 | HR 64 | Temp 97.3°F | Resp 16 | Ht 67.0 in | Wt 196.2 lb

## 2016-12-20 DIAGNOSIS — I25810 Atherosclerosis of coronary artery bypass graft(s) without angina pectoris: Secondary | ICD-10-CM | POA: Diagnosis not present

## 2016-12-20 DIAGNOSIS — M545 Low back pain, unspecified: Secondary | ICD-10-CM

## 2016-12-20 MED ORDER — PREDNISONE 20 MG PO TABS: ORAL_TABLET | ORAL | 0 refills | Status: DC

## 2016-12-20 NOTE — Progress Notes (Signed)
Subjective:    Patient ID: [name], male    DOB: 02/21/1946, 71 y.o.   MRN: [id number]  HPI  This nice 71 yo MWM with HTN, HLD, ASCAD s/p PTCA/Stents, PreDM presents acutely with 2 yr hx/o intermittent LBP . Has had EDSI by Dr Brien Few 2 yrs ago with improvement for 6 +/- months and then a 2sd shot in Feb 2018 w/o much benefit. Patient saw Dr Saintclair Halsted in the spring and had MRI showing "arthritis". He reports occasional Rt sciatica.   Medication Sig  . allopurinol (ZYLOPRIM) 300 MG tablet Take 1 tablet (300 mg total) by mouth daily.  . Ascorbic Acid (VITAMIN C) 1000 MG tablet Take 1,000 mg by mouth daily.  Marland Kitchen aspirin EC 81 MG tablet Take 81 mg by mouth daily.  . B Complex Vitamins (B COMPLEX PO) Take by mouth daily.  . bisoprolol-hydrochlorothiazide (ZIAC) 5-6.25 MG tablet Take 1 tablet by mouth daily.  . Choline Dihydrogen Citrate (CHOLINE CITRATE) 650 MG TABS Take by mouth.  . clobetasol (TEMOVATE) 0.05 % external solution Apply 1 application topically daily.  . clopidogrel (PLAVIX) 75 MG tablet TAKE ONE TABLET BY MOUTH DAILY  . diazepam (VALIUM) 5 MG tablet Take 1 tablet (5 mg total) by mouth every 8 (eight) hours as needed for anxiety.  . fish oil-omega-3 fatty acids 1000 MG capsule Take 2 g by mouth daily.   . Flaxseed, Linseed, (FLAX SEED OIL) 1000 MG CAPS Take 1,000 mg by mouth 2 (two) times daily.   . fluocinonide (LIDEX) 0.05 % external solution Apply 1 application topically 2 (two) times daily.   . folic acid (FOLVITE) 366 MCG tablet Take 800 mcg by mouth daily.  . Magnesium 500 MG TABS Take 500 mg by mouth 2 (two) times daily.  Marland Kitchen MELATONIN PO Take 30 mg by mouth daily.  . Multiple Vitamin (MULTIVITAMIN) capsule Take 1 capsule by mouth daily.    . niacin 500 MG tablet Take 500 mg by mouth 2 (two) times daily.  . nitroGLYCERIN (NITROSTAT) 0.4 MG SL tablet DISSOLVE 1 TABLET UNDER TONGUE EVERY 3 TO 5 MINUTES FOR ANGINA  . OVER THE COUNTER MEDICATION Takes ALA(alpha lipoic)  . OVER  THE COUNTER MEDICATION Takes lecithin 1200 mg 2 times daily.  Marland Kitchen OVER THE COUNTER MEDICATION Valerian 400 mg 3 tabs at night.  . traMADol (ULTRAM) 50 MG tablet Take 1 tablet (50 mg total) by mouth every 6 (six) hours as needed.  . Turmeric 500 MG CAPS Take 500 mg by mouth 4 (four) times daily.   . valACYclovir (VALTREX) 500 MG tablet    No facility-administered medications prior to visit.    Allergies  Allergen Reactions  . Codeine Itching and Other (See Comments)    Also hallucinations   . Morphine And Related Other (See Comments)    Hallucinations  . Nsaids Other (See Comments)    BLEEDING  . Crestor [Rosuvastatin]   . Cymbalta [Duloxetine Hcl]   . Dilaudid [Hydromorphone Hcl]   . Effexor [Venlafaxine]   . Gluten Meal   . Percocet [Oxycodone-Acetaminophen]   . Sulfa Antibiotics   . Topamax [Topiramate]   . Trazodone And Nefazodone   . Adhesive [Tape] Other (See Comments)    SKIN PEELS  . Penicillins Rash   Past Medical History:  Diagnosis Date  . Anxiety    treated /w Xanax for mild depression , using 2 times per day, not PRN  . Asthma   . Benign prostatic hypertrophy   .  CAD (coronary artery disease)    pt last left heart cath was in Nov 2008. EF was 55% on left ventriculogram. Circumflex was totally occluded after the 1st obtuse marginal with collaterals supplying the distal circumflex. LAD showed luminal irregularities. The stent in LAD was patent. The RCA showed luminal irregularities. The stent in th emid RCA and PDA were patent. There were no inverventions.   . Cancer ([place])    basal cell- face & head  . Chronic obstructive pulmonary disease (HCC)    asthma  . Cluster headache    relative to sinus problems   . Cluster headache   . Depression   . DM2 (diabetes mellitus, type 2) ([place])    well controlled  . GERD (gastroesophageal reflux disease)    hiatal hernia. Patient did hav ea Nissen fundoplication  . History of blood transfusion    need for Bld. transfusion  relative to taking NSAIDS  . HTN (hypertension)    pt. followed by [facility], last cardiac visit 2012, one yr. ago  . Low back pain    chronic  . 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 Obesity   . Osteoarthritis   . Peptic ulcer disease   . Sleep concern    states the study (2003 )was done at [facility]., it failed & he was told to return & he never did. Pt. states he has been found by prev. hosp. staff that he has to be told when to breathe & his wife does the same.   Marland Kitchen Spinal fracture    hx of traumatic in Jan 2009 after falling off the roof   Past Surgical History:  Procedure Laterality Date  . BACK SURGERY     x3- last surgery lumbar- 1998- / fusion   . blepheroplasty     both eyes  . C5-T6 posterior fusion     with Oasis and radius screws  . CARDIAC CATHETERIZATION     2008 & 2012 multiple stents    . cataracts     cataracts removed- /w IOL - both eyes   . ESOPHAGEAL MANOMETRY    . EYE SURGERY    . HARDWARE REMOVAL  02/20/2012   Procedure: HARDWARE REMOVAL;  Surgeon: [name], MD;  Location: [place] [facility];  Service: Neurosurgery;  Laterality: N/A;  Hardware Removal  . HIATAL HERNIA REPAIR    . LAPAROSCOPIC CHOLECYSTECTOMY     & IOC  . LUMBAR SPINE SURGERY     x4  . multiple percutaneous coronary interventions    . NASAL SINUS SURGERY     x2, sees Dr. [name], still having problems, states he uses Benadryl PRN- up to 5 times per day   . right carpal tunnel release    . right temporal artery biopsy    . UMBILICAL HERNIA REPAIR     Review of Systems  10 point systems review negative except as above.    Objective:   Physical Exam  BP 140/86   Pulse 64   Temp (!) 97.3 F (36.3 C)   Resp 16   Ht 5\' 7"  (1.702 m)   Wt 196 lb 3.2 oz (89 kg)   BMI 30.73 kg/m   HEENT - WNL. Neck - supple.  Chest - Clear equal BS. Cor - Nl HS. RRR w/o sig MGR. PP 1(+). No edema. MS- Sl straightening of Lumbar  Lordosis with low paralumbar pt  tenderness at the SI jt areas.   Gait Nl. Neuro -  Nl w/o focal abnormalities. SLR is negative.     Assessment & Plan:   1. Low back pain without sciatica, unspecified back pain laterality, unspecified chronicity  - predniSONE (DELTASONE) 20 MG tablet; 1 tab 3 x day for 3 days, then 1 tab 2 x day for 3 days, then 1 tab 1 x day for 5 days  Dispense: 20 tablet; Refill: 0  - Discussed using his trainer at the [facility] for assistance in Low Back mechanics and low impact stretching training exercises.

## 2016-12-20 NOTE — Patient Instructions (Addendum)
Back Pain, Adult Back pain is very common in adults.The cause of back pain is rarely dangerous and the pain often gets better over time.The cause of your back pain may not be known. Some common causes of back pain include:  Strain of the muscles or ligaments supporting the spine.  Wear and tear (degeneration) of the spinal disks.  Arthritis.  Direct injury to the back.  For many people, back pain may return. Since back pain is rarely dangerous, most people can learn to manage this condition on their own. Follow these instructions at home: Watch your back pain for any changes. The following actions may help to lessen any discomfort you are feeling:  Remain active. It is stressful on your back to sit or stand in one place for long periods of time. Do not sit, drive, or stand in one place for more than 30 minutes at a time. Take short walks on even surfaces as soon as you are able.Try to increase the length of time you walk each day.  Exercise regularly as directed by your health care provider. Exercise helps your back heal faster. It also helps avoid future injury by keeping your muscles strong and flexible.  Do not stay in bed.Resting more than 1-2 days can delay your recovery.  Pay attention to your body when you bend and lift. The most comfortable positions are those that put less stress on your recovering back. Always use proper lifting techniques, including: ? Bending your knees. ? Keeping the load close to your body. ? Avoiding twisting.  Find a comfortable position to sleep. Use a firm mattress and lie on your side with your knees slightly bent. If you lie on your back, put a pillow under your knees.  Avoid feeling anxious or stressed.Stress increases muscle tension and can worsen back pain.It is important to recognize when you are anxious or stressed and learn ways to manage it, such as with exercise.  Take medicines only as directed by your health care provider.  Over-the-counter medicines to reduce pain and inflammation are often the most helpful.Your health care provider may prescribe muscle relaxant drugs.These medicines help dull your pain so you can more quickly return to your normal activities and healthy exercise.  Apply ice to the injured area: ? Put ice in a plastic bag. ? Place a towel between your skin and the bag. ? Leave the ice on for 20 minutes, 2-3 times a day for the first 2-3 days. After that, ice and heat may be alternated to reduce pain and spasms.  Maintain a healthy weight. Excess weight puts extra stress on your back and makes it difficult to maintain good posture.  Contact a health care provider if:  You have pain that is not relieved with rest or medicine.  You have increasing pain going down into the legs or buttocks.  You have pain that does not improve in one week.  You have night pain.  You lose weight.  You have a fever or chills. Get help right away if:  You develop new bowel or bladder control problems.  You have unusual weakness or numbness in your arms or legs.  You develop nausea or vomiting.  You develop abdominal pain.  You feel faint. This information is not intended to replace advice given to you by your health care provider. Make sure you discuss any questions you have with your health care provider. Document Released: 05/30/2005 Document Revised: 10/08/2015 Document Reviewed: 10/01/2013 Elsevier Interactive Patient Education  2017 [place] Adult Low Dose Aspirin or  coated  Aspirin 81 mg daily  To reduce risk of Colon Cancer 20 %,  Skin Cancer 26 % ,  Melanoma 46%  and  Pancreatic cancer 60% +++++++++++++++++++++++++ Vitamin D goal  is between 70-100.  Please make sure that you are taking your Vitamin D as directed.  It is very important as a natural anti-inflammatory  helping hair, skin, and nails, as well as reducing stroke and heart attack risk.  It helps your bones  and helps with mood. It also decreases numerous cancer risks so please take it as directed.  Low Vit D is associated with a 200-300% higher risk for CANCER  and 200-300% higher risk for HEART   ATTACK  &  STROKE.   .....................................Marland Kitchen It is also associated with higher death rate at younger ages,  autoimmune diseases like Rheumatoid arthritis, Lupus, Multiple Sclerosis.    Also many other serious conditions, like depression, Alzheimer's Dementia, infertility, muscle aches, fatigue, fibromyalgia - just to name a few. ++++++++++++++++++++ Recommend the book "The END of DIETING" by Dr Excell Seltzer  & the book "The END of DIABETES " by Dr Excell Seltzer At [email or web address] - get book & Audio CD's    Being diabetic has a  300% increased risk for heart attack, stroke, cancer, and alzheimer- type vascular dementia. It is very important that you work harder with diet by avoiding all foods that are white. Avoid white rice (brown & wild rice is OK), white potatoes (sweetpotatoes in moderation is OK), White bread or wheat bread or anything made out of white flour like bagels, donuts, rolls, buns, biscuits, cakes, pastries, cookies, pizza crust, and pasta (made from white flour & egg whites) - vegetarian pasta or spinach or wheat pasta is OK. Multigrain breads like Arnold's or Pepperidge Farm, or multigrain sandwich thins or flatbreads.  Diet, exercise and weight loss can reverse and cure diabetes in the early stages.  Diet, exercise and weight loss is very important in the control and prevention of complications of diabetes which affects every system in your body, ie. Brain - dementia/stroke, eyes - glaucoma/blindness, heart - heart attack/heart failure, kidneys - dialysis, stomach - gastric paralysis, intestines - malabsorption, nerves - severe painful neuritis, circulation - gangrene & loss of a leg(s), and finally cancer and Alzheimers.    I recommend avoid fried & greasy foods,  sweets/candy, white  rice (brown or wild rice or Quinoa is OK), white potatoes (sweet potatoes are OK) - anything made from white flour - bagels, doughnuts, rolls, buns, biscuits,white and wheat breads, pizza crust and traditional pasta made of white flour & egg white(vegetarian pasta or spinach or wheat pasta is OK).  Multi-grain bread is OK - like multi-grain flat bread or sandwich thins. Avoid alcohol in excess. Exercise is also important.    Eat all the vegetables you want - avoid meat, especially red meat and dairy - especially cheese.  Cheese is the most concentrated form of trans-fats which is the worst thing to clog up our arteries. Veggie cheese is OK which can be found in the fresh produce section at [employer] or [employer] or [employer]  +++++++++++++++++++++ DASH Eating Plan  DASH stands for "Dietary Approaches to Stop Hypertension."   The DASH eating plan is a healthy eating plan that has been shown to reduce high blood pressure (hypertension). Additional health benefits may include reducing the risk of type 2 diabetes mellitus, heart disease, and stroke. The DASH  eating plan may also help with weight loss. WHAT DO I NEED TO KNOW ABOUT THE DASH EATING PLAN? For the DASH eating plan, you will follow these general guidelines:  Choose foods with a percent daily value for sodium of less than 5% (as listed on the food label).  Use salt-free seasonings or herbs instead of table salt or sea salt.  Check with your health care provider or pharmacist before using salt substitutes.  Eat lower-sodium products, often labeled as "lower sodium" or "no salt added."  Eat fresh foods.  Eat more vegetables, fruits, and low-fat dairy products.  Choose whole grains. Look for the word "whole" as the first word in the ingredient list.  Choose fish   Limit sweets, desserts, sugars, and sugary drinks.  Choose heart-healthy fats.  Eat veggie cheese   Eat more home-cooked food and less restaurant, buffet, and  fast food.  Limit fried foods.  Cook foods using methods other than frying.  Limit canned vegetables. If you do use them, rinse them well to decrease the sodium.  When eating at a restaurant, ask that your food be prepared with less salt, or no salt if possible.                      WHAT FOODS CAN I EAT? Read Dr Fara Olden Fuhrman's books on The End of Dieting & The End of Diabetes  Grains Whole grain or whole wheat bread. Brown rice. Whole grain or whole wheat pasta. Quinoa, bulgur, and whole grain cereals. Low-sodium cereals. Corn or whole wheat flour tortillas. Whole grain cornbread. Whole grain crackers. Low-sodium crackers.  Vegetables Fresh or frozen vegetables (raw, steamed, roasted, or grilled). Low-sodium or reduced-sodium tomato and vegetable juices. Low-sodium or reduced-sodium tomato sauce and paste. Low-sodium or reduced-sodium canned vegetables.   Fruits All fresh, canned (in natural juice), or frozen fruits.  Protein Products  All fish and seafood.  Dried beans, peas, or lentils. Unsalted nuts and seeds. Unsalted canned beans.  Dairy Low-fat dairy products, such as skim or 1% milk, 2% or reduced-fat cheeses, low-fat ricotta or cottage cheese, or plain low-fat yogurt. Low-sodium or reduced-sodium cheeses.  Fats and Oils Tub margarines without trans fats. Light or reduced-fat mayonnaise and salad dressings (reduced sodium). Avocado. Safflower, olive, or canola oils. Natural peanut or almond butter.  Other Unsalted popcorn and pretzels. The items listed above may not be a complete list of recommended foods or beverages. Contact your dietitian for more options.  +++++++++++++++  WHAT FOODS ARE NOT RECOMMENDED? Grains/ White flour or wheat flour White bread. White pasta. White rice. Refined cornbread. Bagels and croissants. Crackers that contain trans fat.  Vegetables  Creamed or fried vegetables. Vegetables in a . Regular canned vegetables. Regular canned tomato sauce  and paste. Regular tomato and vegetable juices.  Fruits Dried fruits. Canned fruit in light or heavy syrup. Fruit juice.  Meat and Other Protein Products Meat in general - RED meat & White meat.  Fatty cuts of meat. Ribs, chicken wings, all processed meats as bacon, sausage, bologna, salami, fatback, hot dogs, bratwurst and packaged luncheon meats.  Dairy Whole or 2% milk, cream, half-and-half, and cream cheese. Whole-fat or sweetened yogurt. Full-fat cheeses or blue cheese. Non-dairy creamers and whipped toppings. Processed cheese, cheese spreads, or cheese curds.  Condiments Onion and garlic salt, seasoned salt, table salt, and sea salt. Canned and packaged gravies. Worcestershire sauce. Tartar sauce. Barbecue sauce. Teriyaki sauce. Soy sauce, including reduced sodium. Steak sauce. Fish  sauce. Oyster sauce. Cocktail sauce. Horseradish. Ketchup and mustard. Meat flavorings and tenderizers. Bouillon cubes. Hot sauce. Tabasco sauce. Marinades. Taco seasonings. Relishes.  Fats and Oils Butter, stick margarine, lard, shortening and bacon fat. Coconut, palm kernel, or palm oils. Regular salad dressings.  Pickles and olives. Salted popcorn and pretzels.  The items listed above may not be a complete list of foods and beverages to avoid.

## 2017-01-11 ENCOUNTER — Ambulatory Visit (INDEPENDENT_AMBULATORY_CARE_PROVIDER_SITE_OTHER): Payer: Medicare Other | Admitting: Orthopaedic Surgery

## 2017-01-11 DIAGNOSIS — M85611 Other cyst of bone, right shoulder: Secondary | ICD-10-CM

## 2017-01-11 DIAGNOSIS — I25810 Atherosclerosis of coronary artery bypass graft(s) without angina pectoris: Secondary | ICD-10-CM

## 2017-01-11 MED ORDER — METHYLPREDNISOLONE ACETATE 40 MG/ML IJ SUSP
40.0000 mg | INTRAMUSCULAR | Status: AC | PRN
  Administered 2017-01-11: 40 mg via INTRA_ARTICULAR

## 2017-01-11 MED ORDER — LIDOCAINE HCL 1 % IJ SOLN
3.0000 mL | INTRAMUSCULAR | Status: AC | PRN
  Administered 2017-01-11: 3 mL

## 2017-01-11 NOTE — Progress Notes (Signed)
Office Visit Note   Patient: [name]           Date of Birth: 1946-05-10           MRN: [id number] Visit Date: 01/11/2017              Requested by: [name], MD [address] [place], [place] [address] PCP: [name], MD   Assessment & Plan: Visit Diagnoses: No diagnosis found.  Plan: I cleaned this area over the chromic of joint of the right side it was able to place an 18-gauge needle in it and drain gelatinous material consistent with a benign ganglion cyst. I then placed injection of 1 mL lidocaine and 1 mL of Depo-Medrol around this. We talked about the system of the treatment options are. He also just follow this along and I agree with him. If it worsens in any way or gets larger Consider surgery he will let us know. I'm also happy to aspirate it and place a steroid injection down the road again if needed. All questions were encouraged and answered.  Follow-Up Instructions: Return if symptoms worsen or fail to improve.   Orders:  No orders of the defined types were placed in this encounter.  No orders of the defined types were placed in this encounter.     Procedures: Large Joint Inj Date/Time: 01/11/2017 2:04 PM Performed by: Mcarthur Rossetti Authorized by: Mcarthur Rossetti   Location:  Shoulder Ultrasound Guidance: No   Fluoroscopic Guidance: No   Arthrogram: No   Medications:  3 mL lidocaine 1 %; 40 mg methylPREDNISolone acetate 40 MG/ML     Clinical Data: No additional findings.   Subjective: No chief complaint on file.  HPI The patient is well-known to me. He has had previous surgery on his right shoulder arthroscopically years ago. He is 71 years old and is noticed a mass and he points over the acromioclavicular joint as a source the mass. He says it feels a little hard him and is not painful but he wanted this checked out to make sure everything was okay. He just notices slowly growing for a while but  is not gotten significantly bigger. Review of Systems He currently denies any headache, chest pain, short of breath, fever, chills, nausea, vomiting.  Objective: Vital Signs: There were no vitals taken for this visit.  Physical Exam He is alert or 3 and in no acute distress Ortho Exam Examination of the shoulder shows a hypermobile small mass over the acromioclavicular joint consistent with a ganglion cyst or even arthritic changes at the acromioclavicular joint of the right shoulder. Specialty Comments:  No specialty comments available.  Imaging: No results found.   PMFS History: Patient Active Problem List   Diagnosis Date Noted  . OSA and COPD overlap syndrome ([place]) 09/14/2015  . BMI 29.0-29.9,adult 03/11/2015  . Medicare annual wellness visit, subsequent 03/11/2015  . Hypersomnia with sleep apnea 10/23/2014  . Food allergy 07/01/2014  . Rhinitis, nonallergic, chronic 07/01/2014  . Vitamin D deficiency 07/25/2013  . Medication management 07/25/2013  . CKD stage 2 due to type 2 diabetes mellitus ([place]) 01/13/2009  . Hyperlipidemia 01/13/2009  . Morbid obesity (BMI 29.22) 01/13/2009  . Cluster headache syndrome 01/13/2009  . Essential hypertension 01/13/2009  . Coronary atherosclerosis 01/13/2009  . COPD mixed type ([place]) 01/13/2009  . GERD 01/13/2009  . Lumbago, Chronic 01/13/2009  . PUD 01/13/2009  . BPH/Prostatism 01/13/2009   Past Medical History:  Diagnosis  Date  . Anxiety    treated /w Xanax for mild depression , using 2 times per day, not PRN  . Asthma   . Benign prostatic hypertrophy   . CAD (coronary artery disease)    pt last left heart cath was in Nov 2008. EF was 55% on left ventriculogram. Circumflex was totally occluded after the 1st obtuse marginal with collaterals supplying the distal circumflex. LAD showed luminal irregularities. The stent in LAD was patent. The RCA showed luminal irregularities. The stent in th emid RCA and PDA were patent. There were  no inverventions.   . Cancer ([place])    basal cell- face & head  . Chronic obstructive pulmonary disease (HCC)    asthma  . Cluster headache    relative to sinus problems   . Cluster headache   . Depression   . DM2 (diabetes mellitus, type 2) ([place])    well controlled  . GERD (gastroesophageal reflux disease)    hiatal hernia. Patient did hav ea Nissen fundoplication  . History of blood transfusion    need for Bld. transfusion relative to taking NSAIDS  . HTN (hypertension)    pt. followed by [facility], last cardiac visit 2012, one yr. ago  . Low back pain    chronic  . 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 Obesity   . Osteoarthritis   . Peptic ulcer disease   . Sleep concern    states the study (2003 )was done [facility] it failed & he was told to return & he never did. Pt. states he has been found by prev. hosp. staff that he has to be told when to breathe & his wife does the same.   Marland Kitchen Spinal fracture    hx of traumatic in Jan 2009 after falling off the roof    Family History  Problem Relation Age of Onset  . Heart failure Mother        in her 44s  . Coronary artery disease Father        developed in his 68s  . Heart attack Brother        in there 80s  . Heart attack Brother        in there 51s  . Gout Sister     Past Surgical History:  Procedure Laterality Date  . BACK SURGERY     x3- last surgery lumbar- 1998- / fusion   . blepheroplasty     both eyes  . C5-T6 posterior fusion     with [place] and radius screws  . CARDIAC CATHETERIZATION     2008 & 2012 multiple stents    . cataracts     cataracts removed- /w IOL - both eyes   . ESOPHAGEAL MANOMETRY    . EYE SURGERY    . HARDWARE REMOVAL  02/20/2012   Procedure: HARDWARE REMOVAL;  Surgeon: [name], MD;  Location: [facility];  Service: Neurosurgery;  Laterality: N/A;  Hardware Removal  . HIATAL HERNIA REPAIR    . LAPAROSCOPIC  CHOLECYSTECTOMY     & IOC  . LUMBAR SPINE SURGERY     x4  . multiple percutaneous coronary interventions    . NASAL SINUS SURGERY     x2, sees Dr. [name], still having problems, states he uses Benadryl PRN- up to 5 times per day   . right carpal tunnel release    .  right temporal artery biopsy    . UMBILICAL HERNIA REPAIR     Social History   Occupational History  . Not on file.   Social History Main Topics  . Smoking status: Former Smoker    Quit date: 02/14/1978  . Smokeless tobacco: Never Used  . Alcohol use 0.0 oz/week     Comment: rare  . Drug use: No  . Sexual activity: Not on file

## 2017-01-12 NOTE — Progress Notes (Signed)
MEDICARE ANNUAL WELLNESS VISIT AND FOLLOW UP Assessment:   Essential hypertension - continue medications, DASH diet, exercise and monitor at home. Call if greater than 130/80.  -     CBC with Differential/Platelet -     BASIC METABOLIC PANEL WITH GFR -     Hepatic function panel -     TSH  Atherosclerosis of coronary artery bypass graft of native heart without angina pectoris Control blood pressure, cholesterol, glucose, increase exercise.   COPD mixed type ([place]) LONG DISCUSSION, NEEDS TO GET ON CPAP DUE TO SOB/SYMPTOMS  OSA and COPD overlap syndrome (HCC) LONG DISCUSSION, NEEDS TO GET ON CPAP DUE TO SOB/SYMPTOMS WILL NOT SLEEP WELL UNTIL ON A CPAP  Rhinitis, nonallergic, chronic - Allegra OTC, increase H20, allergy hygiene explained.  PUD Continue PPI/H2 blocker, diet discussed  Gastroesophageal reflux disease, esophagitis presence not specified Continue PPI/H2 blocker, diet discussed  CKD stage 2 due to type 2 diabetes mellitus ([place]) -     Hemoglobin A1c  Chronic cluster headache, not intractable Monitor, avoid triggers  BPH/Prostatism monitor  Hyperlipidemia -continue medications, check lipids, decrease fatty foods, increase activity.  -     Lipid panel  Morbid Obesity with co morbidities - long discussion about weight loss, diet, and exercisE  Chronic midline low back pain with right-sided sciatica -     meloxicam (MOBIC) 15 MG tablet; Take one daily with food for 2 weeks, can take with tylenol, can not take with aleve, iburpofen, then as needed daily for pain - has history of PUD, will stop prednisone and take mobic sparingly with H2 blocker and food, will follow up with ortho  Medication management -     Magnesium  Vitamin D deficiency Continue supplement  Hypersomnia with sleep apnea LONG DISCUSSION, NEEDS TO GET ON CPAP DUE TO SOB/SYMPTOMS WILL NOT SLEEP WELL UNTIL ON A CPAP  Food allergy monitor  Medicare annual wellness visit,  subsequent  Advanced care counseling/discussion Discussed advanced care planning with patient   Over 30 minutes of exam, counseling, chart review, and critical decision making was performed  Future Appointments Date Time Provider [place]  01/24/2017 5:30 PM [facility] None  04/28/2017 9:00 AM [name], MD [facility] None     Plan:   During the course of the visit the patient was educated and counseled about appropriate screening and preventive services including:    Pneumococcal vaccine   Influenza vaccine  Prevnar 13  Td vaccine  Screening electrocardiogram  Colorectal cancer screening  Diabetes screening  Glaucoma screening  Nutrition counseling    Subjective:  [name] is a 71 y.o. male who presents for Medicare Annual Wellness Visit and 3 month follow up for HTN, hyperlipidemia, prediabetes, and vitamin D Def.   His blood pressure has been controlled at home, today their BP is BP: 118/76 He does workout. He denies chest pain, shortness of breath, dizziness.  Has history of OSA, NOT on CPAP. States he can not sleep at night, will wake up.   He is on daily prednisone for OA, he has pain, and is on 10 mg daily. Follows with Dr. Assunta Curtis for injections, states last one did nothing but one before was good, lower back pain.   He is on cholesterol medication and denies myalgias. His cholesterol is at goal. The cholesterol last visit was:   Lab Results  Component Value Date   CHOL 170 10/10/2016   HDL 41 10/10/2016   LDLCALC 94 10/10/2016  LDLDIRECT 116.0 06/03/2013   TRIG 177 (H) 10/10/2016   CHOLHDL 4.1 10/10/2016    He was in DM range several years ago, but with diet got it back to normal range.  Lab Results  Component Value Date   HGBA1C 5.3 10/10/2016   Last GFR Lab Results  Component Value Date   GFRNONAA 59 (L) 10/10/2016    Patient is on Vitamin D supplement.   Lab Results  Component  Value Date   VD25OH 94 10/10/2016     Patient is on allopurinol for gout and does not report a recent flare.  Lab Results  Component Value Date   LABURIC 6.4 10/10/2016   BMI is Body mass index is 30.95 kg/m., he is working on diet and exercise. Wt Readings from Last 3 Encounters:  01/13/17 197 lb 9.6 oz (89.6 kg)  12/20/16 196 lb 3.2 oz (89 kg)  10/10/16 194 lb (88 kg)     Medication Review: Current Outpatient Prescriptions on File Prior to Visit  Medication Sig Dispense Refill  . allopurinol (ZYLOPRIM) 300 MG tablet Take 1 tablet (300 mg total) by mouth daily. 90 tablet 0  . Ascorbic Acid (VITAMIN C) 1000 MG tablet Take 1,000 mg by mouth daily.    Marland Kitchen aspirin EC 81 MG tablet Take 81 mg by mouth daily.    . B Complex Vitamins (B COMPLEX PO) Take by mouth daily.    . bisoprolol-hydrochlorothiazide (ZIAC) 5-6.25 MG tablet Take 1 tablet by mouth daily. 90 tablet 1  . Choline Dihydrogen Citrate (CHOLINE CITRATE) 650 MG TABS Take by mouth.    . clobetasol (TEMOVATE) 0.05 % external solution Apply 1 application topically daily. 50 mL 0  . clopidogrel (PLAVIX) 75 MG tablet TAKE ONE TABLET BY MOUTH DAILY 90 tablet 1  . diazepam (VALIUM) 5 MG tablet Take 1 tablet (5 mg total) by mouth every 8 (eight) hours as needed for anxiety. 90 tablet 0  . fish oil-omega-3 fatty acids 1000 MG capsule Take 2 g by mouth daily.     . Flaxseed, Linseed, (FLAX SEED OIL) 1000 MG CAPS Take 1,000 mg by mouth 2 (two) times daily.     . fluocinonide (LIDEX) 0.05 % external solution Apply 1 application topically 2 (two) times daily.     . folic acid (FOLVITE) 683 MCG tablet Take 800 mcg by mouth daily.    Marland Kitchen ipratropium (ATROVENT) 0.03 % nasal spray instill 1 to 2 sprays into each nostril every 6 hours if needed  1  . Magnesium 500 MG TABS Take 500 mg by mouth 2 (two) times daily.    Marland Kitchen MELATONIN PO Take 30 mg by mouth daily.    . Multiple Vitamin (MULTIVITAMIN) capsule Take 1 capsule by mouth daily.      . niacin  500 MG tablet Take 500 mg by mouth 2 (two) times daily.    . nitroGLYCERIN (NITROSTAT) 0.4 MG SL tablet DISSOLVE 1 TABLET UNDER TONGUE EVERY 3 TO 5 MINUTES FOR ANGINA 225 tablet 98  . OVER THE COUNTER MEDICATION Takes ALA(alpha lipoic)    . OVER THE COUNTER MEDICATION Takes lecithin 1200 mg 2 times daily.    Marland Kitchen OVER THE COUNTER MEDICATION Valerian 400 mg 3 tabs at night.    . predniSONE (DELTASONE) 20 MG tablet 1 tab 3 x day for 3 days, then 1 tab 2 x day for 3 days, then 1 tab 1 x day for 5 days 20 tablet 0  . traMADol (ULTRAM) 50 MG tablet  Take 1 tablet (50 mg total) by mouth every 6 (six) hours as needed. 90 tablet 0  . Turmeric 500 MG CAPS Take 500 mg by mouth 4 (four) times daily.     . valACYclovir (VALTREX) 500 MG tablet   0   No current facility-administered medications on file prior to visit.     Allergies: Allergies  Allergen Reactions  . Codeine Itching and Other (See Comments)    Also hallucinations   . Morphine And Related Other (See Comments)    Hallucinations  . Nsaids Other (See Comments)    BLEEDING  . Crestor [Rosuvastatin]   . Cymbalta [Duloxetine Hcl]   . Dilaudid [Hydromorphone Hcl]   . Effexor [Venlafaxine]   . Gluten Meal   . Percocet [Oxycodone-Acetaminophen]   . Sulfa Antibiotics   . Topamax [Topiramate]   . Trazodone And Nefazodone   . Adhesive [Tape] Other (See Comments)    SKIN PEELS  . Penicillins Rash    Current Problems (verified) has CKD stage 2 due to type 2 diabetes mellitus ([place]); Hyperlipidemia; Morbid obesity (BMI 29.22); Cluster headache syndrome; Essential hypertension; Coronary atherosclerosis; COPD mixed type ([place]); GERD; Lumbago, Chronic; PUD; BPH/Prostatism; Vitamin D deficiency; Medication management; Food allergy; Rhinitis, nonallergic, chronic; Hypersomnia with sleep apnea; BMI 29.0-29.9,adult; Medicare annual wellness visit, subsequent; and OSA and COPD overlap syndrome ([place]) on his problem list.  Screening Tests Immunization  History  Administered Date(s) Administered  . DT 12/09/2014  . Influenza Split 02/17/2012  . Influenza, High Dose Seasonal PF 03/03/2014, 03/11/2015, 04/04/2016  . Pneumococcal Conjugate-13 12/25/2014  . Pneumococcal Polysaccharide-23 01/19/2012  . Pneumococcal-Unspecified 06/13/1997  . Td 06/13/2004  . Zoster 06/14/2007   Preventative care: Last colonoscopy: 03/2016  Prior vaccinations: TD or Tdap: 6/16 Influenza: 2017 Pneumococcal: 8/13 Prevnar13: 2016 Shingles/Zostavax: 2009  Names of Other Physician/Practitioners you currently use: 1. [facility] here for primary care 2. Dr. Katy Fitch, eye doctor, last visit Jan 2018 3. 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, community, last visit March 2018 Patient Care Team: [name], MD as PCP - General (Internal Medicine) Kathie Rhodes, MD as Consulting Physician (Urology) Warden Fillers, MD as Consulting Physician (Optometry) Larey Dresser, MD as Consulting Physician (Cardiology) Laurence Spates, MD as Consulting Physician (Gastroenterology) Deneise Lever, MD as Consulting Physician (Pulmonary Disease)  Surgical: He  has a past surgical history that includes Hiatal hernia repair; Lumbar spine surgery; right temporal artery biopsy; C5-T6 posterior fusion; right carpal tunnel release; Esophageal manometry; Laparoscopic cholecystectomy; Umbilical hernia repair; multiple percutaneous coronary interventions; Back surgery; cataracts; blepheroplasty; Eye surgery; Nasal sinus surgery; Cardiac catheterization; and Hardware Removal (02/20/2012). Family His family history includes Coronary artery disease in his father; Gout in his sister; Heart attack in his brother and brother; Heart failure in his mother. Social history  He reports that he quit smoking about 38 years ago. He has never used smokeless tobacco. He reports that he drinks alcohol. He reports that he does not use drugs.  MEDICARE WELLNESS OBJECTIVES: Physical  activity: Current Exercise Habits: Structured exercise class, Type of exercise: strength training/weights, Time (Minutes): 30, Frequency (Times/Week): 2, Weekly Exercise (Minutes/Week): 60 Cardiac risk factors: Cardiac Risk Factors include: advanced age (>30men, >10 women);dyslipidemia;family history of premature cardiovascular disease;hypertension;male gender;obesity (BMI >30kg/m2);sedentary lifestyle Depression/mood screen:   Depression screen [facility] 2/9 01/13/2017  Decreased Interest 0  Down, Depressed, Hopeless 0  PHQ - 2 Score 0    ADLs:  In your present state of health, do you have any difficulty performing the following  activities: 01/13/2017 10/10/2016  Hearing? N N  Vision? N N  Difficulty concentrating or making decisions? N N  Walking or climbing stairs? N N  Dressing or bathing? N N  Doing errands, shopping? N N  Some recent data might be hidden     Cognitive Testing  Alert? Yes  Normal Appearance?Yes  Oriented to person? Yes  Place? Yes   Time? Yes  Recall of three objects?  Yes  Can perform simple calculations? Yes  Displays appropriate judgment?Yes  Can read the correct time from a watch face?Yes  EOL planning: Does Patient Have a Medical Advance Directive?: Yes Type of Advance Directive: Healthcare Power of Attorney, Living will Copy of [place] in Chart?: No - copy requested   Objective:   Today's Vitals   01/13/17 0949  BP: 118/76  Pulse: 71  Resp: 14  Temp: 97.7 F (36.5 C)  SpO2: 97%  Weight: 197 lb 9.6 oz (89.6 kg)  Height: 5\' 7"  (1.702 m)  PainSc: 2    Body mass index is 30.95 kg/m.  General appearance: alert, no distress, WD/WN, male HEENT: normocephalic, sclerae anicteric, TMs pearly, nares patent, no discharge or erythema, pharynx normal Oral cavity: MMM, no lesions Neck: supple, no lymphadenopathy, no thyromegaly, no masses Heart: RRR, normal S1, S2, no murmurs Lungs: CTA bilaterally, no wheezes, rhonchi, or rales Abdomen:  +bs, soft, non tender, non distended, no masses, no hepatomegaly, no splenomegaly Musculoskeletal: nontender, no swelling, no obvious deformity Extremities: no edema, no cyanosis, no clubbing Pulses: 2+ symmetric, upper and lower extremities, normal cap refill Neurological: alert, oriented x 3, CN2-12 intact, strength normal upper extremities and lower extremities, sensation normal throughout, DTRs 2+ throughout, no cerebellar signs, gait normal Psychiatric: normal affect, behavior normal, pleasant   Medicare Attestation I have personally reviewed: The patient's medical and social history Their use of alcohol, tobacco or illicit drugs Their current medications and supplements The patient's functional ability including ADLs,fall risks, home safety risks, cognitive, and hearing and visual impairment Diet and physical activities Evidence for depression or mood disorders  The patient's weight, height, BMI, and visual acuity have been recorded in the chart.  I have made referrals, counseling, and provided education to the patient based on review of the above and I have provided the patient with a written personalized care plan for preventive services.     Vicie Mutters, PA-C   01/13/2017

## 2017-01-13 ENCOUNTER — Encounter: Payer: Self-pay | Admitting: Physician Assistant

## 2017-01-13 ENCOUNTER — Ambulatory Visit (INDEPENDENT_AMBULATORY_CARE_PROVIDER_SITE_OTHER): Payer: Medicare Other | Admitting: Physician Assistant

## 2017-01-13 VITALS — BP 118/76 | HR 71 | Temp 97.7°F | Resp 14 | Ht 67.0 in | Wt 197.6 lb

## 2017-01-13 DIAGNOSIS — I25810 Atherosclerosis of coronary artery bypass graft(s) without angina pectoris: Secondary | ICD-10-CM

## 2017-01-13 DIAGNOSIS — Z79899 Other long term (current) drug therapy: Secondary | ICD-10-CM | POA: Diagnosis not present

## 2017-01-13 DIAGNOSIS — G4730 Sleep apnea, unspecified: Secondary | ICD-10-CM

## 2017-01-13 DIAGNOSIS — G4710 Hypersomnia, unspecified: Secondary | ICD-10-CM

## 2017-01-13 DIAGNOSIS — K219 Gastro-esophageal reflux disease without esophagitis: Secondary | ICD-10-CM | POA: Diagnosis not present

## 2017-01-13 DIAGNOSIS — Z7189 Other specified counseling: Secondary | ICD-10-CM

## 2017-01-13 DIAGNOSIS — J310 Chronic rhinitis: Secondary | ICD-10-CM | POA: Diagnosis not present

## 2017-01-13 DIAGNOSIS — Z0001 Encounter for general adult medical examination with abnormal findings: Secondary | ICD-10-CM

## 2017-01-13 DIAGNOSIS — E782 Mixed hyperlipidemia: Secondary | ICD-10-CM

## 2017-01-13 DIAGNOSIS — N182 Chronic kidney disease, stage 2 (mild): Secondary | ICD-10-CM

## 2017-01-13 DIAGNOSIS — E559 Vitamin D deficiency, unspecified: Secondary | ICD-10-CM

## 2017-01-13 DIAGNOSIS — E1122 Type 2 diabetes mellitus with diabetic chronic kidney disease: Secondary | ICD-10-CM | POA: Diagnosis not present

## 2017-01-13 DIAGNOSIS — Z91018 Allergy to other foods: Secondary | ICD-10-CM

## 2017-01-13 DIAGNOSIS — Z0000 Encounter for general adult medical examination without abnormal findings: Secondary | ICD-10-CM

## 2017-01-13 DIAGNOSIS — M5441 Lumbago with sciatica, right side: Secondary | ICD-10-CM

## 2017-01-13 DIAGNOSIS — J449 Chronic obstructive pulmonary disease, unspecified: Secondary | ICD-10-CM

## 2017-01-13 DIAGNOSIS — G44029 Chronic cluster headache, not intractable: Secondary | ICD-10-CM | POA: Diagnosis not present

## 2017-01-13 DIAGNOSIS — I10 Essential (primary) hypertension: Secondary | ICD-10-CM

## 2017-01-13 DIAGNOSIS — K279 Peptic ulcer, site unspecified, unspecified as acute or chronic, without hemorrhage or perforation: Secondary | ICD-10-CM

## 2017-01-13 DIAGNOSIS — N320 Bladder-neck obstruction: Secondary | ICD-10-CM

## 2017-01-13 DIAGNOSIS — G4733 Obstructive sleep apnea (adult) (pediatric): Secondary | ICD-10-CM

## 2017-01-13 DIAGNOSIS — M545 Low back pain, unspecified: Secondary | ICD-10-CM

## 2017-01-13 DIAGNOSIS — G8929 Other chronic pain: Secondary | ICD-10-CM

## 2017-01-13 DIAGNOSIS — R6889 Other general symptoms and signs: Secondary | ICD-10-CM

## 2017-01-13 LAB — CBC WITH DIFFERENTIAL/PLATELET
BASOS PCT: 1 %
Basophils Absolute: 69 cells/uL (ref 0–200)
Eosinophils Absolute: 69 cells/uL (ref 15–500)
Eosinophils Relative: 1 %
HEMATOCRIT: 47.5 % (ref 38.5–50.0)
Hemoglobin: 16.1 g/dL (ref 13.2–17.1)
LYMPHS PCT: 21 %
Lymphs Abs: 1449 cells/uL (ref 850–3900)
MCH: 31.6 pg (ref 27.0–33.0)
MCHC: 33.9 g/dL (ref 32.0–36.0)
MCV: 93.1 fL (ref 80.0–100.0)
MONO ABS: 621 {cells}/uL (ref 200–950)
MONOS PCT: 9 %
MPV: 9.9 fL (ref 7.5–12.5)
Neutro Abs: 4692 cells/uL (ref 1500–7800)
Neutrophils Relative %: 68 %
Platelets: 167 10*3/uL (ref 140–400)
RBC: 5.1 MIL/uL (ref 4.20–5.80)
RDW: 14.5 % (ref 11.0–15.0)
WBC: 6.9 10*3/uL (ref 3.8–10.8)

## 2017-01-13 MED ORDER — MELOXICAM 15 MG PO TABS: ORAL_TABLET | ORAL | 1 refills | Status: DC

## 2017-01-13 NOTE — Patient Instructions (Addendum)
Weight loss will help Taper off prednisone, go to 10 mg every other day and then stop Follow up ortho for possible injection Can try the mobic as needed This can cause stomach bleeds so take with food and take with zantac If you have any dark  Blood in stool stop  And only take mobic AS needed since you have an a stomach bleed in the past  Drink 80-100 oz a day of water, measure it out Eat 3 meals a day, have to do breakfast, eat protein- hard boiled eggs, protein bar like [place] protein bar, greek yogurt like oikos triple zero, chobani 100, or light n fit greek Follow up with [facility]   Simple math prevails.    1st - exercise does not produce significant weight loss - at best one converts fat into muscle , "bulks up", loses inches, but usually stays "weight neutral"     2nd - think of your body weightas a check book: If you eat more calories than you burn up - you save money or gain weight .... Or if you spend more money than you put in the check book, ie burn up more calories than you eat, then you lose weight     3rd - if you walk or run 1 mile, you burn up 100 calories - you have to burn up 3,500 calories to lose 1 pound, ie you have to walk/run 35 miles to lose 1 measly pound. So if you want to lose 10 #, then you have to walk/run 350 miles, so.... clearly exercise is not the solution.     4. So if you consume 1,500 calories, then you have to burn up the equivalent of 15 miles to stay weight neutral - It also stands to reason that if you consume 1,500 cal/day and don't lose weight, then you must be burning up about 1,500 cals/day to stay weight neutral.     5. If you really want to lose weight, you must cut your calorie intake 300 calories /day and at that rate you should lose about 1 # every 3 days.   6. Please purchase Dr Fara Olden Fuhrman's book(s) "The End of Dieting" & "Eat to Live" . It has some great concepts and recipes.     We want weight loss that will last so you  should lose 1-2 pounds a week.  THAT IS IT! Please pick THREE things a month to change. Once it is a habit check off the item. Then pick another three items off the list to become habits.  If you are already doing a habit on the list GREAT!  Cross that item off! o Don't drink your calories. Ie, alcohol, soda, fruit juice, and sweet tea.  o Drink more water. Drink a glass when you feel hungry or before each meal.  o Eat breakfast - Complex carb and protein (likeDannon light and fit yogurt, oatmeal, fruit, eggs, [place] bacon). o Measure your cereal.  Eat no more than one cup a day. (ie [place]) o Eat an apple a day. o Add a vegetable a day. o Try a new vegetable a month. o Use Pam! Stop using oil or butter to cook. o Don't finish your plate or use smaller plates. o Share your dessert. o Eat sugar free Jello for dessert or frozen grapes. o Don't eat 2-3 hours before bed. o Switch to whole wheat bread, pasta, and brown rice. o Make healthier choices when you eat out. No fries! o Pick  baked chicken, NOT fried. o Don't forget to SLOW DOWN when you eat. It is not going anywhere.  o Take the stairs. o Park far away in the parking lot o [employer] (or weights) for 10 minutes while watching TV. o Walk at work for 10 minutes during break. o Walk outside 1 time a week with your friend, kids, dog, or significant other. o Start a walking group at [place] the mall as much as you can tolerate.  o Keep a food diary. o Weigh yourself daily. o Walk for 15 minutes 3 days per week. o Cook at home more often and eat out less.  If life happens and you go back to old habits, it is okay.  Just start over. You can do it!   If you experience chest pain, get short of breath, or tired during the exercise, please stop immediately and inform your doctor.

## 2017-01-14 LAB — BASIC METABOLIC PANEL WITH GFR
BUN: 19 mg/dL (ref 7–25)
CO2: 27 mmol/L (ref 20–31)
CREATININE: 1.11 mg/dL (ref 0.70–1.18)
Calcium: 9.7 mg/dL (ref 8.6–10.3)
Chloride: 96 mmol/L — ABNORMAL LOW (ref 98–110)
GFR, EST AFRICAN AMERICAN: 76 mL/min (ref >=60)
GFR, Est Non African American: 66 mL/min (ref >=60)
GLUCOSE: 90 mg/dL (ref 65–99)
POTASSIUM: 4.2 mmol/L (ref 3.5–5.3)
Sodium: 136 mmol/L (ref 135–146)

## 2017-01-14 LAB — HEPATIC FUNCTION PANEL
ALBUMIN: 3.7 g/dL (ref 3.6–5.1)
ALK PHOS: 47 U/L (ref 40–115)
ALT: 16 U/L (ref 9–46)
AST: 18 U/L (ref 10–35)
Bilirubin, Direct: 0.1 mg/dL (ref <=0.2)
Indirect Bilirubin: 0.5 mg/dL (ref 0.2–1.2)
TOTAL PROTEIN: 6.9 g/dL (ref 6.1–8.1)
Total Bilirubin: 0.6 mg/dL (ref 0.2–1.2)

## 2017-01-14 LAB — HEMOGLOBIN A1C
HEMOGLOBIN A1C: 5.6 % (ref <5.7)
Mean Plasma Glucose: 114 mg/dL

## 2017-01-14 LAB — LIPID PANEL
Cholesterol: 155 mg/dL (ref <200)
HDL: 54 mg/dL (ref >40)
LDL CALC: 70 mg/dL (ref <100)
TRIGLYCERIDES: 154 mg/dL — AB (ref <150)
Total CHOL/HDL Ratio: 2.9 Ratio (ref <5.0)
VLDL: 31 mg/dL — ABNORMAL HIGH (ref <30)

## 2017-01-14 LAB — MAGNESIUM: MAGNESIUM: 1.9 mg/dL (ref 1.5–2.5)

## 2017-01-14 LAB — TSH: TSH: 0.96 m[IU]/L (ref 0.40–4.50)

## 2017-01-16 ENCOUNTER — Other Ambulatory Visit: Payer: Self-pay

## 2017-01-16 MED ORDER — BISOPROLOL-HYDROCHLOROTHIAZIDE 5-6.25 MG PO TABS: 1.0000 | ORAL_TABLET | Freq: Every day | ORAL | 1 refills | Status: DC

## 2017-01-16 MED ORDER — CLOPIDOGREL BISULFATE 75 MG PO TABS: 75.0000 mg | ORAL_TABLET | Freq: Every day | ORAL | 1 refills | Status: DC

## 2017-01-17 ENCOUNTER — Other Ambulatory Visit: Payer: Self-pay | Admitting: Physician Assistant

## 2017-01-17 MED ORDER — DIAZEPAM 5 MG PO TABS: 5.0000 mg | ORAL_TABLET | Freq: Three times a day (TID) | ORAL | 0 refills | Status: DC | PRN

## 2017-01-17 NOTE — Progress Notes (Signed)
Valium was called into pharmacy on Aug 7th 2018 at 9:13am by DD

## 2017-01-23 ENCOUNTER — Telehealth: Payer: Self-pay

## 2017-01-23 ENCOUNTER — Telehealth: Payer: Self-pay | Admitting: Internal Medicine

## 2017-01-23 ENCOUNTER — Encounter: Payer: Self-pay | Admitting: Physician Assistant

## 2017-01-23 NOTE — Telephone Encounter (Signed)
01/23/17 we received paperwork for patient clearance. Patient will be having an ESI lumbar on 01/31/17 by Dr. Brien Few. Per Nena Polio, PA-C she had cleared patient back in March of 2018 as well as Dr. Aundra Dubin did in the past. Per Nena Polio, PA-C patient is cleared for procedure. She has asked me to send last office visit note with clearance form as well as contacting patient about any current symptoms.   I have contacted patient about clearance form. Patient denies any current chest pain, tightness, shortness of breath, or any other cardiac symptoms. Patient is aware of holding plavix and aspirin for 7 days prior to procedure. I advised patient that I will be faxing form over to [facility] for Dr. Brien Few. Patient is to call us if he has any cardiac symptoms listed above within the next few days prior to procedure.   I have faxed over clearance form as well as last office visit. I have asked [facility] to contact me once they have received the form.

## 2017-01-23 NOTE — Telephone Encounter (Signed)
[facility] called to advise that patient no longer had cpap service with them. Patient turned in CPAP machine 2017.  Per [facility]. New Sleep Study will be needed to establish neccessity. Patient aware. Please advise.

## 2017-01-23 NOTE — Telephone Encounter (Signed)
Faxed order request  for CPAP facemask, fitting, supplies, and Raw Feed Download to [place], per Vicie Mutters from 01-13-17 office visit.

## 2017-01-24 ENCOUNTER — Encounter (INDEPENDENT_AMBULATORY_CARE_PROVIDER_SITE_OTHER): Payer: Medicare Other

## 2017-01-24 NOTE — Telephone Encounter (Signed)
What is the question? He will need a new sleep study.  Estill Bamberg

## 2017-01-24 NOTE — Telephone Encounter (Signed)
Left second message on machine to call the office to schedule an appointment.

## 2017-01-26 ENCOUNTER — Ambulatory Visit (INDEPENDENT_AMBULATORY_CARE_PROVIDER_SITE_OTHER): Payer: Medicare Other | Admitting: Family Medicine

## 2017-01-26 ENCOUNTER — Encounter (INDEPENDENT_AMBULATORY_CARE_PROVIDER_SITE_OTHER): Payer: Self-pay | Admitting: Family Medicine

## 2017-01-26 VITALS — BP 152/75 | HR 54 | Temp 98.1°F | Ht 66.0 in | Wt 190.0 lb

## 2017-01-26 DIAGNOSIS — Z0289 Encounter for other administrative examinations: Secondary | ICD-10-CM

## 2017-01-26 DIAGNOSIS — Z6830 Body mass index (BMI) 30.0-30.9, adult: Secondary | ICD-10-CM | POA: Diagnosis not present

## 2017-01-26 DIAGNOSIS — R5383 Other fatigue: Secondary | ICD-10-CM | POA: Diagnosis not present

## 2017-01-26 DIAGNOSIS — E559 Vitamin D deficiency, unspecified: Secondary | ICD-10-CM

## 2017-01-26 DIAGNOSIS — R0602 Shortness of breath: Secondary | ICD-10-CM | POA: Diagnosis not present

## 2017-01-26 DIAGNOSIS — E119 Type 2 diabetes mellitus without complications: Secondary | ICD-10-CM | POA: Diagnosis not present

## 2017-01-26 DIAGNOSIS — I10 Essential (primary) hypertension: Secondary | ICD-10-CM | POA: Diagnosis not present

## 2017-01-26 DIAGNOSIS — Z1389 Encounter for screening for other disorder: Secondary | ICD-10-CM | POA: Diagnosis not present

## 2017-01-26 DIAGNOSIS — Z1331 Encounter for screening for depression: Secondary | ICD-10-CM

## 2017-01-26 DIAGNOSIS — E669 Obesity, unspecified: Secondary | ICD-10-CM | POA: Diagnosis not present

## 2017-01-26 NOTE — Progress Notes (Signed)
Office: [phone]  /  Fax: [phone]   HPI:   Chief Complaint: OBESITY  [name] (MR# [id number]) is a 71 y.o. male who presents on 01/26/2017 for obesity evaluation and treatment. Current BMI is Body mass index is 30.67 kg/m.Marland Kitchen [name] has struggled with obesity for years and has been unsuccessful in either losing weight or maintaining long term weight loss. [name] attended our information session and states he is currently in the action stage of change and ready to dedicate time achieving and maintaining a healthier weight.  [name] states his family eats meals together he thinks his family will eat healthier with  him his desired weight loss is 13 lbs he has been heavy most of  his life he started gaining weight 25 yrs ago his heaviest weight ever was 225 lbs. he has significant food cravings issues  he snacks frequently in the evenings   Fatigue [name] feels his energy is lower than it should be. This has worsened with weight gain and has not worsened recently. [name] admits to daytime somnolence and  admits to waking up still tired. Patient is at risk for obstructive sleep apnea. Patent has a history of symptoms of daytime fatigue and hypertension. Patient generally gets 6 hours of sleep per night, and states they generally have restless sleep. Snoring is present. Apneic episodes are present. Epworth Sleepiness Score is 6  Dyspnea on exertion [name] notes increasing shortness of breath with exercising and seems to be worsening over time with weight gain. He notes getting out of breath sooner with activity than he used to. This has not gotten worse recently. [name] denies orthopnea.  Vitamin D deficiency [name] has a diagnosis of vitamin D deficiency. He is currently taking OTC vit D and admits fatigue but denies nausea, vomiting or muscle weakness.  Hypertension [name] is a 71 y.o. male with hypertension. His blood pressure is elevated today and he  did take his medications. His normal blood pressure control is unknown.  [name] denies chest pain. He is attempting weight loss to help control his blood pressure with the goal of decreasing his risk of heart attack and stroke. [name] blood pressure is not currently controlled.  Diabetes II [name] was diagnosed with diabetes approximately 20 years ago and noticed low blood sugar. He is not on medication and is not checking his BGs at home. He considers diabetes to have been temporary. He is attempting to work on intensive lifestyle modifications including diet, exercise, and weight loss to help control his blood glucose levels.  Depression Screen [name] Food and Mood (modified PHQ-9) score was  Depression screen PHQ 2/9 01/26/2017  Decreased Interest 3  Down, Depressed, Hopeless 1  PHQ - 2 Score 4  Altered sleeping 1  Tired, decreased energy 2  Change in appetite 1  Feeling bad or failure about yourself  1  Trouble concentrating 1  Moving slowly or fidgety/restless 1  Suicidal thoughts 0  PHQ-9 Score 11    ALLERGIES: Allergies  Allergen Reactions   Codeine Itching and Other (See Comments)    Also hallucinations    Morphine And Related Other (See Comments)    Hallucinations   Nsaids Other (See Comments)    BLEEDING   Crestor [Rosuvastatin]    Cymbalta [Duloxetine Hcl]    Dilaudid [Hydromorphone Hcl]    Effexor [Venlafaxine]    Gluten Meal    Percocet [Oxycodone-Acetaminophen]    Sulfa Antibiotics    Topamax [Topiramate]    Trazodone  And Nefazodone    Adhesive [Tape] Other (See Comments)    SKIN PEELS   Penicillins Rash    MEDICATIONS: Current Outpatient Prescriptions on File Prior to Visit  Medication Sig Dispense Refill   allopurinol (ZYLOPRIM) 300 MG tablet Take 1 tablet (300 mg total) by mouth daily. 90 tablet 0   Ascorbic Acid (VITAMIN C) 1000 MG tablet Take 1,000 mg by mouth daily.     aspirin EC 81 MG tablet Take 81 mg by mouth  daily.     B Complex Vitamins (B COMPLEX PO) Take by mouth daily.     bisoprolol-hydrochlorothiazide (ZIAC) 5-6.25 MG tablet Take 1 tablet by mouth daily. 90 tablet 1   Choline Dihydrogen Citrate (CHOLINE CITRATE) 650 MG TABS Take by mouth.     clobetasol (TEMOVATE) 0.05 % external solution Apply 1 application topically daily. 50 mL 0   clopidogrel (PLAVIX) 75 MG tablet Take 1 tablet (75 mg total) by mouth daily. 90 tablet 1   diazepam (VALIUM) 5 MG tablet Take 1 tablet (5 mg total) by mouth every 8 (eight) hours as needed for anxiety. 90 tablet 0   fish oil-omega-3 fatty acids 1000 MG capsule Take 2 g by mouth daily.      Flaxseed, Linseed, (FLAX SEED OIL) 1000 MG CAPS Take 1,000 mg by mouth 2 (two) times daily.      fluocinonide (LIDEX) 0.05 % external solution Apply 1 application topically 2 (two) times daily.      folic acid (FOLVITE) 967 MCG tablet Take 800 mcg by mouth daily.     ipratropium (ATROVENT) 0.03 % nasal spray instill 1 to 2 sprays into each nostril every 6 hours if needed  1   Magnesium 500 MG TABS Take 500 mg by mouth 2 (two) times daily.     MELATONIN PO Take 30 mg by mouth daily.     meloxicam (MOBIC) 15 MG tablet Take one daily with food for 2 weeks, can take with tylenol, can not take with aleve, iburpofen, then as needed daily for pain 30 tablet 1   Multiple Vitamin (MULTIVITAMIN) capsule Take 1 capsule by mouth daily.       niacin 500 MG tablet Take 500 mg by mouth 2 (two) times daily.     nitroGLYCERIN (NITROSTAT) 0.4 MG SL tablet DISSOLVE 1 TABLET UNDER TONGUE EVERY 3 TO 5 MINUTES FOR ANGINA 225 tablet 98   OVER THE COUNTER MEDICATION Takes ALA(alpha lipoic)     OVER THE COUNTER MEDICATION Takes lecithin 1200 mg 2 times daily.     OVER THE COUNTER MEDICATION Valerian 400 mg 3 tabs at night.     predniSONE (DELTASONE) 20 MG tablet 1 tab 3 x day for 3 days, then 1 tab 2 x day for 3 days, then 1 tab 1 x day for 5 days 20 tablet 0   traMADol (ULTRAM)  50 MG tablet Take 1 tablet (50 mg total) by mouth every 6 (six) hours as needed. 90 tablet 0   Turmeric 500 MG CAPS Take 500 mg by mouth 4 (four) times daily.      valACYclovir (VALTREX) 500 MG tablet   0   No current facility-administered medications on file prior to visit.     PAST MEDICAL HISTORY: Past Medical History:  Diagnosis Date   Anxiety    treated /w Xanax for mild depression , using 2 times per day, not PRN   Asthma    Benign prostatic hypertrophy    CAD (coronary artery  disease)    pt last left heart cath was in Nov 2008. EF was 55% on left ventriculogram. Circumflex was totally occluded after the 1st obtuse marginal with collaterals supplying the distal circumflex. LAD showed luminal irregularities. The stent in LAD was patent. The RCA showed luminal irregularities. The stent in th emid RCA and PDA were patent. There were no inverventions.    Cancer (HCC)    basal cell- face & head   Chest pain    Chronic obstructive pulmonary disease (HCC)    asthma   Cluster headache    relative to sinus problems    Cluster headache    Constipation    Depression    Depression    DM2 (diabetes mellitus, type 2) (HCC)    well controlled   GERD (gastroesophageal reflux disease)    hiatal hernia. Patient did hav ea Nissen fundoplication   History of blood transfusion    need for Bld. transfusion relative to taking NSAIDS   HTN (hypertension)    pt. followed by [facility], last cardiac visit 2012, one yr. 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 Pt.  states he has been found by prev. hosp. staff that he has to be told when to breathe & his wife does the same.    Spinal fracture    hx of traumatic in Jan 2009 after falling off the roof    PAST SURGICAL HISTORY: Past Surgical History:  Procedure Laterality Date   BACK SURGERY     x3- last surgery lumbar- 1998- / fusion    blepheroplasty     both eyes   C5-T6 posterior fusion     with Oasis and radius screws   CARDIAC CATHETERIZATION     2008 & 2012 multiple stents     cataracts     cataracts removed- /w IOL - both eyes    ESOPHAGEAL MANOMETRY     EYE SURGERY     HARDWARE REMOVAL  02/20/2012   Procedure: HARDWARE REMOVAL;  Surgeon: [name], MD;  Location: [facility];  Service: Neurosurgery;  Laterality: N/A;  Hardware Removal   HIATAL HERNIA REPAIR     LAPAROSCOPIC CHOLECYSTECTOMY     & IOC   LUMBAR SPINE SURGERY     x4   multiple percutaneous coronary interventions     NASAL SINUS SURGERY     x2, sees Dr. [name], still having problems, states he uses Benadryl PRN- up to 5 times per day    right carpal tunnel release     right temporal artery biopsy     UMBILICAL HERNIA REPAIR      SOCIAL HISTORY: Social History  Substance Use Topics   Smoking status: Former Smoker    Quit date: 02/14/1978   Smokeless tobacco: Never Used   Alcohol use 0.0 oz/week     Comment: rare    FAMILY HISTORY: Family History  Problem Relation Age of Onset   Heart  failure Mother        in her 98s   Hypertension Mother    Heart attack Mother    Obesity Mother    Coronary artery disease Father        developed in his 31s   Hypertension Father    Heart attack Father    Heart attack Brother        in there 100s   Heart attack Brother        in there 39s   Gout Sister     ROS: Review of Systems  Constitutional: Positive for malaise/fatigue.  HENT: Positive for tinnitus.        Nasal Discharge (constant) Dentures Hoarseness (periodic)  Eyes:        Lens Replacement  Respiratory: Positive for shortness of breath (on exertion) and wheezing (occasional).   Cardiovascular: Negative for chest pain and orthopnea.       Leg Cramping  Gastrointestinal: Negative for nausea and vomiting.  Musculoskeletal: Positive for back pain and neck pain (occasional from accident).       Muscle or Joint Pain Negative muscle weakness  Neurological: Positive for weakness.       Head Injury  Endo/Heme/Allergies: Bruises/bleeds easily.       Hypoglycemia  Psychiatric/Behavioral: Positive for depression. The patient has insomnia.        Stress     PHYSICAL EXAM: Blood pressure (!) 152/75, pulse (!) 54, temperature 98.1 F (36.7 C), temperature source Oral, height 5\' 6"  (1.676 m), weight 190 lb (86.2 kg), SpO2 98 %. Body mass index is 30.67 kg/m. Physical Exam  Constitutional: He is oriented to person, place, and time. He appears well-developed and well-nourished.  Cardiovascular: Normal rate.   Pulmonary/Chest: Effort normal.  Musculoskeletal: Normal range of motion.  Neurological: He is oriented to person, place, and time.  Skin: Skin is warm and dry.  Psychiatric: He has a normal mood and affect. His behavior is normal.  Vitals reviewed.   RECENT LABS AND TESTS: BMET    Component Value Date/Time   NA 136 01/13/2017 1038   K 4.2 01/13/2017 1038   CL 96 (L) 01/13/2017 1038   CO2 27 01/13/2017 1038   GLUCOSE 90 01/13/2017 1038   BUN 19 01/13/2017 1038   CREATININE 1.11 01/13/2017 1038   CALCIUM 9.7 01/13/2017 1038   GFRNONAA 66 01/13/2017 1038   GFRAA 76 01/13/2017 1038   Lab Results  Component Value Date   HGBA1C 5.6 01/13/2017   No results found for: INSULIN CBC    Component Value Date/Time   WBC 6.9 01/13/2017 1038   RBC 5.10 01/13/2017 1038   HGB 16.1 01/13/2017 1038   HCT 47.5 01/13/2017 1038   PLT 167 01/13/2017 1038   MCV 93.1 01/13/2017 1038   MCH 31.6 01/13/2017 1038   MCHC 33.9 01/13/2017 1038   RDW 14.5 01/13/2017  1038   LYMPHSABS 1,449 01/13/2017 1038   MONOABS 621 01/13/2017 1038   EOSABS 69 01/13/2017 1038   BASOSABS 69 01/13/2017 1038   Iron/TIBC/Ferritin/ %Sat    Component Value Date/Time   IRON 48 05/16/2013 1610   TIBC 306 05/16/2013 1610   IRONPCTSAT 16 (L) 05/16/2013 1610   Lipid Panel     Component Value Date/Time   CHOL 155 01/13/2017 1038   TRIG 154 (H) 01/13/2017 1038   HDL 54 01/13/2017 1038   CHOLHDL 2.9 01/13/2017 1038   VLDL 31 (H) 01/13/2017 1038   LDLCALC 70 01/13/2017 1038   LDLDIRECT  116.0 06/03/2013 1513   Hepatic Function Panel     Component Value Date/Time   PROT 6.9 01/13/2017 1038   ALBUMIN 3.7 01/13/2017 1038   AST 18 01/13/2017 1038   ALT 16 01/13/2017 1038   ALKPHOS 47 01/13/2017 1038   BILITOT 0.6 01/13/2017 1038   BILIDIR 0.1 01/13/2017 1038   IBILI 0.5 01/13/2017 1038      Component Value Date/Time   TSH 0.96 01/13/2017 1038   TSH 0.78 10/10/2016 1206   TSH 1.53 07/12/2016 1419    ECG  shows NSR with a rate of 54 BPM INDIRECT CALORIMETER done today shows a VO2 of 193 and a REE of 1345. His calculated basal metabolic rate is 2297 thus his basal metabolic rate is worse than expected.    ASSESSMENT AND PLAN: Other fatigue - Plan: EKG 12-Lead, CBC with Differential/Platelet, T3, T4, free, TSH  Shortness of breath on exertion  Essential hypertension  Type 2 diabetes mellitus without complication, without long-term current use of insulin ([place]) - Plan: Comprehensive metabolic panel, Hemoglobin A1c, Insulin, random, Microalbumin / creatinine urine ratio  Vitamin D deficiency - Plan: VITAMIN D 25 Hydroxy (Vit-D Deficiency, Fractures)  Other hyperlipidemia - Plan: Lipid Panel With LDL/HDL Ratio  Depression screening  Class 1 obesity with serious comorbidity and body mass index (BMI) of 30.0 to 30.9 in adult, unspecified obesity type  PLAN:  Fatigue Letrell was informed that his fatigue may be related to obesity, depression or many other  causes. Labs will be ordered, and in the meanwhile Ngai has agreed to work on diet, exercise and weight loss to help with fatigue. Proper sleep hygiene was discussed including the need for 7-8 hours of quality sleep each night. A sleep study was not ordered based on symptoms and Epworth score.  Dyspnea on exertion Sahith's shortness of breath appears to be obesity related and exercise induced. He has agreed to work on weight loss and gradually increase exercise to treat his exercise induced shortness of breath. If Trestin follows our instructions and loses weight without improvement of his shortness of breath, we will plan to refer to pulmonology. We will monitor this condition regularly. Breylin agrees to this plan.  Vitamin D Deficiency Cayson was informed that low vitamin D levels contributes to fatigue and are associated with obesity, breast, and colon cancer. He agrees to continue to take OTC Vit D and we will check labs and will follow up for routine testing of vitamin D, at least 2-3 times per year. He was informed of the risk of over-replacement of vitamin D and agrees to not increase his dose unless he discusses this with [name] first. Decarlo agrees to follow up with our clinic in 2 weeks  Hypertension We discussed sodium restriction, working on healthy weight loss, and a regular exercise program as the means to achieve improved blood pressure control. Angel agreed with this plan and agreed to follow up as directed. We will check labs and re-check blood pressure in 2 weeks. We will continue to monitor his blood pressure as well as his progress with the above lifestyle modifications. He will continue his medications as prescribed and will watch for signs of hypotension as he continues his lifestyle modifications.  Diabetes II Eathen has been given extensive diabetes education by myself today including ideal fasting and post-prandial blood glucose readings, individual ideal Hgb A1c goals   and hypoglycemia prevention. We discussed the importance of good blood sugar control to decrease the likelihood of diabetic complications such  as nephropathy, neuropathy, limb loss, blindness, coronary artery disease, and death. We discussed the importance of intensive lifestyle modification including diet, exercise and weight loss as the first line treatment for diabetes. Eldo agrees to start diet and we will check labs today. We may have him start to check blood sugar at next visit in 2 weeks and he agrees to follow up at that time.  Depression Screen Archibald had a moderately positive depression screening. Depression is commonly associated with obesity and often results in emotional eating behaviors. We will monitor this closely and work on CBT to help improve the non-hunger eating patterns. Referral to Psychology may be required if no improvement is seen as he continues in our clinic.  Obesity Timm is currently in the action stage of change and his goal is to continue with weight loss efforts He has agreed to follow the Category 2 plan +100 calories Carolyn has been instructed to work up to a goal of 150 minutes of combined cardio and strengthening exercise per week for weight loss and overall health benefits. We discussed the following Behavioral Modification Strategies today: meal planning & cooking strategies, increasing lean protein intake and decreasing simple carbohydrates   Navi has agreed to follow up with our clinic in 2 weeks. He was informed of the importance of frequent follow up visits to maximize his success with intensive lifestyle modifications for his multiple health conditions. He was informed we would discuss his lab results at his next visit unless there is a critical issue that needs to be addressed sooner. Khadim agreed to keep his next visit at the agreed upon time to discuss these results.  I, Doreene Nest, am acting as transcriptionist for Dennard Nip, MD  I  have reviewed the above documentation for accuracy and completeness, and I agree with the above. -Dennard Nip, MD  OBESITY BEHAVIORAL INTERVENTION VISIT  Today's visit was # 1 out of 63.  Starting weight: 190 lbs Starting date: 01/27/16 Today's weight : 190 lbs Today's date: 01/26/2017 Total lbs lost to date: 0 (Patients must lose 7 lbs in the first 6 months to continue with counseling)   ASK: We discussed the diagnosis of obesity with [place] today and Melton agreed to give [name] permission to discuss obesity behavioral modification therapy today.  ASSESS: Ethelbert has the diagnosis of obesity and his BMI today is 30.7 Taras is in the action stage of change   ADVISE: Jamesrobert was educated on the multiple health risks of obesity as well as the benefit of weight loss to improve his health. He was advised of the need for long term treatment and the importance of lifestyle modifications.  AGREE: Multiple dietary modification options and treatment options were discussed and  Jamareon agreed to follow the Category 2 plan +100 calories We discussed the following Behavioral Modification Strategies today: meal planning & cooking strategies, increasing lean protein intake and decreasing simple carbohydrates

## 2017-01-27 LAB — MICROALBUMIN / CREATININE URINE RATIO
Creatinine, Urine: 26.3 mg/dL
MICROALBUM., U, RANDOM: 62.2 ug/mL
Microalb/Creat Ratio: 236.5 mg/g creat — ABNORMAL HIGH (ref 0.0–30.0)

## 2017-01-27 LAB — VITAMIN D 25 HYDROXY (VIT D DEFICIENCY, FRACTURES): Vit D, 25-Hydroxy: 89.5 ng/mL (ref 30.0–100.0)

## 2017-01-27 LAB — INSULIN, RANDOM: INSULIN: 10.1 u[IU]/mL (ref 2.6–24.9)

## 2017-02-01 DIAGNOSIS — M4807 Spinal stenosis, lumbosacral region: Secondary | ICD-10-CM | POA: Diagnosis not present

## 2017-02-01 DIAGNOSIS — I10 Essential (primary) hypertension: Secondary | ICD-10-CM | POA: Diagnosis not present

## 2017-02-01 DIAGNOSIS — M5416 Radiculopathy, lumbar region: Secondary | ICD-10-CM | POA: Diagnosis not present

## 2017-02-01 DIAGNOSIS — Z6831 Body mass index (BMI) 31.0-31.9, adult: Secondary | ICD-10-CM | POA: Diagnosis not present

## 2017-02-08 ENCOUNTER — Ambulatory Visit (INDEPENDENT_AMBULATORY_CARE_PROVIDER_SITE_OTHER): Payer: Medicare Other | Admitting: Family Medicine

## 2017-02-08 VITALS — BP 139/72 | HR 60 | Temp 98.2°F | Ht 66.0 in | Wt 191.0 lb

## 2017-02-08 DIAGNOSIS — E1121 Type 2 diabetes mellitus with diabetic nephropathy: Secondary | ICD-10-CM

## 2017-02-08 DIAGNOSIS — E669 Obesity, unspecified: Secondary | ICD-10-CM | POA: Diagnosis not present

## 2017-02-08 DIAGNOSIS — E559 Vitamin D deficiency, unspecified: Secondary | ICD-10-CM

## 2017-02-08 DIAGNOSIS — Z6831 Body mass index (BMI) 31.0-31.9, adult: Secondary | ICD-10-CM

## 2017-02-08 MED ORDER — VITAMIN D (ERGOCALCIFEROL) 1.25 MG (50000 UNIT) PO CAPS: 50000.0000 [IU] | ORAL_CAPSULE | ORAL | 0 refills | Status: DC

## 2017-02-08 NOTE — Progress Notes (Signed)
Office: [phone]  /  Fax: [phone]   HPI:   Chief Complaint: OBESITY [name] is here to discuss his progress with his obesity treatment plan. He is on the  follow the Category 2 plan and is following his eating plan approximately 60 % of the time. He states he is exercising 0 minutes 0 times per week. [name] did not do well following his plan, due to celebration eating. He has a lot of vague allergy symptoms to a variety of medications and foods which also prevent him from following a structured plan closely.  His weight is 191 lb (86.6 kg) today and has had a weight gain of 1 pound over a period of 2 weeks since his last visit. He has gained 1 lb since starting treatment with [name].  Vitamin D deficiency [name] has a diagnosis of vitamin D deficiency and is at high end of normal of vitamin D on 7,000 IU daily and he is close to being at risk of being over replaced. He denies nausea, vomiting or muscle weakness.  Diabetes II with Nephropathy [name] has a diagnosis of diabetes type II. Oral states he is not checking BGs at home and denies any hypoglycemic episodes. Last A1c was well controlled at 5.6 with diet alone. He has been working on intensive lifestyle modifications including diet, exercise, and weight loss to help control his blood glucose levels.  ALLERGIES: Allergies  Allergen Reactions   Codeine Itching and Other (See Comments)    Also hallucinations    Morphine And Related Other (See Comments)    Hallucinations   Nsaids Other (See Comments)    BLEEDING   Crestor [Rosuvastatin]    Cymbalta [Duloxetine Hcl]    Dilaudid [Hydromorphone Hcl]    Effexor [Venlafaxine]    Gluten Meal    Percocet [Oxycodone-Acetaminophen]    Sulfa Antibiotics    Topamax [Topiramate]    Trazodone And Nefazodone    Adhesive [Tape] Other (See Comments)    SKIN PEELS   Penicillins Rash    MEDICATIONS: Current Outpatient Prescriptions on File Prior to Visit    Medication Sig Dispense Refill   allopurinol (ZYLOPRIM) 300 MG tablet Take 1 tablet (300 mg total) by mouth daily. 90 tablet 0   Ascorbic Acid (VITAMIN C) 1000 MG tablet Take 1,000 mg by mouth daily.     aspirin EC 81 MG tablet Take 81 mg by mouth daily.     B Complex Vitamins (B COMPLEX PO) Take by mouth daily.     bisoprolol-hydrochlorothiazide (ZIAC) 5-6.25 MG tablet Take 1 tablet by mouth daily. 90 tablet 1   Choline Dihydrogen Citrate (CHOLINE CITRATE) 650 MG TABS Take by mouth.     clobetasol (TEMOVATE) 0.05 % external solution Apply 1 application topically daily. 50 mL 0   clopidogrel (PLAVIX) 75 MG tablet Take 1 tablet (75 mg total) by mouth daily. 90 tablet 1   diazepam (VALIUM) 5 MG tablet Take 1 tablet (5 mg total) by mouth every 8 (eight) hours as needed for anxiety. (Patient taking differently: Take 2.5 mg by mouth every 8 (eight) hours as needed for sedation. ) 90 tablet 0   fish oil-omega-3 fatty acids 1000 MG capsule Take 2 g by mouth daily.      Flaxseed, Linseed, (FLAX SEED OIL) 1000 MG CAPS Take 1,000 mg by mouth 2 (two) times daily.      fluocinonide (LIDEX) 0.05 % external solution Apply 1 application topically 2 (two) times daily.      folic  acid (FOLVITE) 800 MCG tablet Take 800 mcg by mouth daily.     ipratropium (ATROVENT) 0.03 % nasal spray instill 1 to 2 sprays into each nostril every 6 hours if needed  1   Magnesium 500 MG TABS Take 500 mg by mouth 3 (three) times daily.      MELATONIN PO Take 30 mg by mouth daily.     meloxicam (MOBIC) 15 MG tablet Take one daily with food for 2 weeks, can take with tylenol, can not take with aleve, iburpofen, then as needed daily for pain 30 tablet 1   Multiple Vitamin (MULTIVITAMIN) capsule Take 1 capsule by mouth daily.       niacin 500 MG tablet Take 500 mg by mouth 2 (two) times daily.     nitroGLYCERIN (NITROSTAT) 0.4 MG SL tablet DISSOLVE 1 TABLET UNDER TONGUE EVERY 3 TO 5 MINUTES FOR ANGINA 225 tablet 98    OVER THE COUNTER MEDICATION Takes ALA(alpha lipoic)     OVER THE COUNTER MEDICATION Takes lecithin 1200 mg 2 times daily.     OVER THE COUNTER MEDICATION Valerian 400 mg 3 tabs at night.     Turmeric 500 MG CAPS Take 500 mg by mouth 4 (four) times daily.      No current facility-administered medications on file prior to visit.     PAST MEDICAL HISTORY: Past Medical History:  Diagnosis Date   Anxiety    treated /w Xanax for mild depression , using 2 times per day, not PRN   Asthma    Benign prostatic hypertrophy    CAD (coronary artery disease)    pt last left heart cath was in Nov 2008. EF was 55% on left ventriculogram. Circumflex was totally occluded after the 1st obtuse marginal with collaterals supplying the distal circumflex. LAD showed luminal irregularities. The stent in LAD was patent. The RCA showed luminal irregularities. The stent in th emid RCA and PDA were patent. There were no inverventions.    Cancer (HCC)    basal cell- face & head   Chest pain    Chronic obstructive pulmonary disease (HCC)    asthma   Cluster headache    relative to sinus problems    Cluster headache    Constipation    Depression    Depression    DM2 (diabetes mellitus, type 2) (HCC)    well controlled   GERD (gastroesophageal reflux disease)    hiatal hernia. Patient did hav ea Nissen fundoplication   History of blood transfusion    need for Bld. transfusion relative to taking NSAIDS   HTN (hypertension)    pt. followed by [facility], last cardiac visit 2012, one yr. 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 Pt. states he has been found by prev. hosp. staff that he has to be told when to breathe & his wife does the same.    Spinal fracture    hx of traumatic in  Jan 2009 after falling off the roof    PAST SURGICAL HISTORY: Past Surgical History:  Procedure Laterality Date   BACK SURGERY     x3- last surgery lumbar- 1998- / fusion    blepheroplasty     both eyes   C5-T6 posterior fusion     with [place]asis and radius screws   CARDIAC CATHETERIZATION     2008 & 2012 multiple stents     cataracts     cataracts removed- /w IOL - both eyes    ESOPHAGEAL MANOMETRY     EYE SURGERY     HARDWARE REMOVAL  02/20/2012   Procedure: HARDWARE REMOVAL;  Surgeon: [name], MD;  Location: [facility];  Service: Neurosurgery;  Laterality: N/A;  Hardware Removal   HIATAL HERNIA REPAIR     LAPAROSCOPIC CHOLECYSTECTOMY     & IOC   LUMBAR SPINE SURGERY     x4   multiple percutaneous coronary interventions     NASAL SINUS SURGERY     x2, sees Dr. [name], still having problems, states he uses Benadryl PRN- up to 5 times per day    right carpal tunnel release     right temporal artery biopsy     UMBILICAL HERNIA REPAIR      SOCIAL HISTORY: Social History  Substance Use Topics   Smoking status: Former Smoker    Quit date: 02/14/1978   Smokeless tobacco: Never Used   Alcohol use 0.0 oz/week     Comment: rare    FAMILY HISTORY: Family History  Problem Relation Age of Onset   Heart failure Mother        in her 74s   Hypertension Mother    Heart attack Mother    Obesity Mother    Coronary artery disease Father        developed in his 66s   Hypertension Father    Heart attack Father    Heart attack Brother        in there 23s   Heart attack Brother        in there 51s   Gout Sister     ROS: Review of Systems  Constitutional: Negative for weight loss.  Gastrointestinal: Negative for nausea and vomiting.    Musculoskeletal:       Negative muscle weakness  Endo/Heme/Allergies:       Negative hypoglycemia    PHYSICAL EXAM: Blood pressure 139/72, pulse 60, temperature 98.2 F (36.8 C), temperature source Oral, height 5\' 6"  (1.676 m), weight 191 lb (86.6 kg), SpO2 95 %. Body mass index is 30.83 kg/m. Physical Exam  Constitutional: He is oriented to person, place, and time. He appears well-developed and well-nourished.  Cardiovascular: Normal rate.   Pulmonary/Chest: Effort normal.  Musculoskeletal: Normal range of motion.  Neurological: He is oriented to person, place, and time.  Skin: Skin is warm and dry.  Psychiatric: He has a normal mood and affect. His behavior is normal.  Vitals reviewed.   RECENT LABS AND TESTS: BMET    Component Value Date/Time   NA 136 01/13/2017 1038   K 4.2 01/13/2017 1038   CL 96 (L) 01/13/2017 1038   CO2 27 01/13/2017 1038   GLUCOSE 90 01/13/2017 1038   BUN 19 01/13/2017 1038   CREATININE 1.11 01/13/2017 1038   CALCIUM 9.7 01/13/2017 1038   GFRNONAA 66 01/13/2017 1038   GFRAA 76 01/13/2017 1038   Lab Results  Component Value Date   HGBA1C 5.6 01/13/2017  HGBA1C 5.3 10/10/2016   HGBA1C 5.4 07/12/2016   HGBA1C 5.4 04/04/2016   HGBA1C 5.4 12/22/2015   Lab Results  Component Value Date   INSULIN 10.1 01/26/2017   CBC    Component Value Date/Time   WBC 6.9 01/13/2017 1038   RBC 5.10 01/13/2017 1038   HGB 16.1 01/13/2017 1038   HCT 47.5 01/13/2017 1038   PLT 167 01/13/2017 1038   MCV 93.1 01/13/2017 1038   MCH 31.6 01/13/2017 1038   MCHC 33.9 01/13/2017 1038   RDW 14.5 01/13/2017 1038   LYMPHSABS 1,449 01/13/2017 1038   MONOABS 621 01/13/2017 1038   EOSABS 69 01/13/2017 1038   BASOSABS 69 01/13/2017 1038   Iron/TIBC/Ferritin/ %Sat    Component Value Date/Time   IRON 48 05/16/2013 1610   TIBC 306 05/16/2013 1610   IRONPCTSAT 16 (L) 05/16/2013 1610   Lipid Panel     Component Value Date/Time   CHOL 155 01/13/2017 1038    TRIG 154 (H) 01/13/2017 1038   HDL 54 01/13/2017 1038   CHOLHDL 2.9 01/13/2017 1038   VLDL 31 (H) 01/13/2017 1038   LDLCALC 70 01/13/2017 1038   LDLDIRECT 116.0 06/03/2013 1513   Hepatic Function Panel     Component Value Date/Time   PROT 6.9 01/13/2017 1038   ALBUMIN 3.7 01/13/2017 1038   AST 18 01/13/2017 1038   ALT 16 01/13/2017 1038   ALKPHOS 47 01/13/2017 1038   BILITOT 0.6 01/13/2017 1038   BILIDIR 0.1 01/13/2017 1038   IBILI 0.5 01/13/2017 1038      Component Value Date/Time   TSH 0.96 01/13/2017 1038   TSH 0.78 10/10/2016 1206   TSH 1.53 07/12/2016 1419    ASSESSMENT AND PLAN: Type 2 diabetes mellitus with diabetic nephropathy, without long-term current use of insulin (HCC)  Vitamin D deficiency - Plan: Vitamin D, Ergocalciferol, (DRISDOL) 50000 units CAPS capsule  Class 1 obesity with serious comorbidity and body mass index (BMI) of 31.0 to 31.9 in adult, unspecified obesity type  PLAN:  Vitamin D Deficiency Shahrukh was informed that low vitamin D levels contributes to fatigue and are associated with obesity, breast, and colon cancer. He agrees to decrease OTC vit D to 5,000 IU daily (discontinue 2,000 IU at highest) and we will re-check labs in 2 weeks and will follow up for routine testing of vitamin D, at least 2-3 times per year. He was informed of the risk of over-replacement of vitamin D and agrees to not increase his dose unless he discusses this with [name] first. Montarius agrees to follow up with our clinic in 2 to 3 weeks.  Diabetes II with Nephropathy Jahron has been given extensive diabetes education by myself today including ideal fasting and post-prandial blood glucose readings, individual ideal Hgb A1c goals  and hypoglycemia prevention. We discussed the importance of good blood sugar control to decrease the likelihood of diabetic complications such as nephropathy, neuropathy, limb loss, blindness, coronary artery disease, and death. We discussed the  importance of intensive lifestyle modification including diet, exercise and weight loss as the first line treatment for diabetes. Fay agrees to continue with diet, exercise and weight loss and will follow up at the agreed upon time.  We spent > than 50% of the 30 minute visit on the counseling as documented in the note.  Obesity Karis is currently in the action stage of change. As such, his goal is to continue with weight loss efforts He has agreed to follow the Category 2 plan Chrissie Noa  has been instructed to work up to a goal of 150 minutes of combined cardio and strengthening exercise per week for weight loss and overall health benefits. We discussed the following Behavioral Modification Strategies today: increase H2O intake, meal planning & cooking strategies and planning for success  Valerie has agreed to follow up with our clinic in 2 to 3 weeks. He was informed of the importance of frequent follow up visits to maximize his success with intensive lifestyle modifications for his multiple health conditions.  I, Doreene Nest, am acting as transcriptionist for Dennard Nip, MD  I have reviewed the above documentation for accuracy and completeness, and I agree with the above. -Dennard Nip, MD   OBESITY BEHAVIORAL INTERVENTION VISIT  Today's visit was # 2 out of 87.  Starting weight: 190 lbs Starting date: 01/26/17 Today's weight : 191 lbs Today's date: 02/08/2017 Total lbs lost to date: 0 (Patients must lose 7 lbs in the first 6 months to continue with counseling)   ASK: We discussed the diagnosis of obesity with [place] today and Catlin agreed to give [name] permission to discuss obesity behavioral modification therapy today.  ASSESS: Younis has the diagnosis of obesity and his BMI today is 30.84 Deontae is in the action stage of change   ADVISE: Jamani was educated on the multiple health risks of obesity as well as the benefit of weight loss to improve his  health. He was advised of the need for long term treatment and the importance of lifestyle modifications.  AGREE: Multiple dietary modification options and treatment options were discussed and  Elizar agreed to follow the Category 2 plan We discussed the following Behavioral Modification Strategies today: increase H2O intake, meal planning & cooking strategies and planning for success

## 2017-02-09 DIAGNOSIS — H10413 Chronic giant papillary conjunctivitis, bilateral: Secondary | ICD-10-CM | POA: Diagnosis not present

## 2017-02-09 DIAGNOSIS — H01024 Squamous blepharitis left upper eyelid: Secondary | ICD-10-CM | POA: Diagnosis not present

## 2017-02-09 DIAGNOSIS — H26492 Other secondary cataract, left eye: Secondary | ICD-10-CM | POA: Diagnosis not present

## 2017-02-09 DIAGNOSIS — H01021 Squamous blepharitis right upper eyelid: Secondary | ICD-10-CM | POA: Diagnosis not present

## 2017-02-09 DIAGNOSIS — Z961 Presence of intraocular lens: Secondary | ICD-10-CM | POA: Diagnosis not present

## 2017-02-09 DIAGNOSIS — E119 Type 2 diabetes mellitus without complications: Secondary | ICD-10-CM | POA: Diagnosis not present

## 2017-02-09 DIAGNOSIS — H01025 Squamous blepharitis left lower eyelid: Secondary | ICD-10-CM | POA: Diagnosis not present

## 2017-02-09 DIAGNOSIS — H01022 Squamous blepharitis right lower eyelid: Secondary | ICD-10-CM | POA: Diagnosis not present

## 2017-02-09 DIAGNOSIS — H04123 Dry eye syndrome of bilateral lacrimal glands: Secondary | ICD-10-CM | POA: Diagnosis not present

## 2017-02-09 LAB — HM DIABETES EYE EXAM

## 2017-02-14 ENCOUNTER — Other Ambulatory Visit: Payer: Self-pay | Admitting: Physician Assistant

## 2017-02-14 ENCOUNTER — Telehealth: Payer: Self-pay | Admitting: Physician Assistant

## 2017-02-14 NOTE — Telephone Encounter (Signed)
Patient started on mobic for back pain last OV, now has rash on extremities.  Tell him to stop the mobic, take benadryl/zyrtec If not better make OV Follow up with ortho If any trouble swallowing, breathing, etc go to ER

## 2017-02-14 NOTE — Telephone Encounter (Signed)
Pt states he has followed up w/ ortho & still having back pain. Pt states he was taking prednisone & that helped. What can he take? Pt states if you could give him something different for the pain he is willing to try it. Please advise?

## 2017-02-15 NOTE — Telephone Encounter (Signed)
PT REPORTS NO PAIN TODAY & STATES HE WILL CALL OUR OFFICE IF ANYTHING MORE PAIN COMES UP

## 2017-02-15 NOTE — Telephone Encounter (Signed)
Needs to follow up with ortho for possible injection or evaluation.

## 2017-02-23 ENCOUNTER — Institutional Professional Consult (permissible substitution): Payer: BLUE CROSS/BLUE SHIELD | Admitting: Neurology

## 2017-02-27 ENCOUNTER — Ambulatory Visit (INDEPENDENT_AMBULATORY_CARE_PROVIDER_SITE_OTHER): Payer: Medicare Other | Admitting: Family Medicine

## 2017-02-27 VITALS — BP 130/56 | HR 72 | Temp 98.1°F | Ht 66.0 in | Wt 188.0 lb

## 2017-02-27 DIAGNOSIS — Z6830 Body mass index (BMI) 30.0-30.9, adult: Secondary | ICD-10-CM | POA: Diagnosis not present

## 2017-02-27 DIAGNOSIS — M4716 Other spondylosis with myelopathy, lumbar region: Secondary | ICD-10-CM

## 2017-02-27 DIAGNOSIS — E669 Obesity, unspecified: Secondary | ICD-10-CM

## 2017-02-28 MED ORDER — ACETAMINOPHEN 500 MG PO TABS: 500.0000 mg | ORAL_TABLET | Freq: Four times a day (QID) | ORAL | 0 refills | Status: AC | PRN

## 2017-02-28 NOTE — Progress Notes (Signed)
Office: [phone]  /  Fax: [phone]   HPI:   Chief Complaint: OBESITY [name] is here to discuss his progress with his obesity treatment plan. He is on the Category 2 plan and is following his eating plan approximately 75 to 80 % of the time. He states he is working around the house for exercise for 60 minutes 3 to 4 times per week. [name] continues to do well with weight loss on the category 2 plan. He feels he is gluten intolerant and is not eating his breakfast completely. His weight is 188 lb (85.3 kg) today and has had a weight loss of 3 pounds over a period of 3 to 4 weeks since his last visit. He has lost 2 lbs since starting treatment with [name].  Chronic Back Arthritis [name] has chronic back arthritis that is improved on mobic but he had a rash and stopped taking, but feels his back is still improved.   ALLERGIES: Allergies  Allergen Reactions  . Codeine Itching and Other (See Comments)    Also hallucinations   . Morphine And Related Other (See Comments)    Hallucinations  . Nsaids Other (See Comments)    BLEEDING  . Crestor [Rosuvastatin]   . Cymbalta [Duloxetine Hcl]   . Dilaudid [Hydromorphone Hcl]   . Effexor [Venlafaxine]   . Gluten Meal   . Percocet [Oxycodone-Acetaminophen]   . Sulfa Antibiotics   . Topamax [Topiramate]   . Trazodone And Nefazodone   . Adhesive [Tape] Other (See Comments)    SKIN PEELS  . Penicillins Rash    MEDICATIONS: Current Outpatient Prescriptions on File Prior to Visit  Medication Sig Dispense Refill  . allopurinol (ZYLOPRIM) 300 MG tablet Take 1 tablet (300 mg total) by mouth daily. 90 tablet 0  . Ascorbic Acid (VITAMIN C) 1000 MG tablet Take 1,000 mg by mouth daily.    Marland Kitchen aspirin EC 81 MG tablet Take 81 mg by mouth daily.    . B Complex Vitamins (B COMPLEX PO) Take by mouth daily.    . bisoprolol-hydrochlorothiazide (ZIAC) 5-6.25 MG tablet Take 1 tablet by mouth daily. 90 tablet 1  . Choline Dihydrogen Citrate (CHOLINE  CITRATE) 650 MG TABS Take by mouth.    . clobetasol (TEMOVATE) 0.05 % external solution Apply 1 application topically daily. 50 mL 0  . clopidogrel (PLAVIX) 75 MG tablet Take 1 tablet (75 mg total) by mouth daily. 90 tablet 1  . diazepam (VALIUM) 5 MG tablet Take 1 tablet (5 mg total) by mouth every 8 (eight) hours as needed for anxiety. (Patient taking differently: Take 2.5 mg by mouth every 8 (eight) hours as needed for sedation. ) 90 tablet 0  . fish oil-omega-3 fatty acids 1000 MG capsule Take 2 g by mouth daily.     . Flaxseed, Linseed, (FLAX SEED OIL) 1000 MG CAPS Take 1,000 mg by mouth 2 (two) times daily.     . fluocinonide (LIDEX) 0.05 % external solution Apply 1 application topically 2 (two) times daily.     . folic acid (FOLVITE) 732 MCG tablet Take 800 mcg by mouth daily.    Marland Kitchen ipratropium (ATROVENT) 0.03 % nasal spray instill 1 to 2 sprays into each nostril every 6 hours if needed  1  . Magnesium 500 MG TABS Take 500 mg by mouth 3 (three) times daily.     Marland Kitchen MELATONIN PO Take 30 mg by mouth daily.    . meloxicam (MOBIC) 15 MG tablet Take one daily with  food for 2 weeks, can take with tylenol, can not take with aleve, iburpofen, then as needed daily for pain 30 tablet 1  . Multiple Vitamin (MULTIVITAMIN) capsule Take 1 capsule by mouth daily.      . niacin 500 MG tablet Take 500 mg by mouth 2 (two) times daily.    . nitroGLYCERIN (NITROSTAT) 0.4 MG SL tablet DISSOLVE 1 TABLET UNDER TONGUE EVERY 3 TO 5 MINUTES FOR ANGINA 225 tablet 98  . OVER THE COUNTER MEDICATION Takes ALA(alpha lipoic)    . OVER THE COUNTER MEDICATION Takes lecithin 1200 mg 2 times daily.    Marland Kitchen OVER THE COUNTER MEDICATION Valerian 400 mg 3 tabs at night.    . Turmeric 500 MG CAPS Take 500 mg by mouth 4 (four) times daily.     . Vitamin D, Ergocalciferol, (DRISDOL) 50000 units CAPS capsule Take 1 capsule (50,000 Units total) by mouth every 7 (seven) days. 4 capsule 0   No current facility-administered medications on  file prior to visit.     PAST MEDICAL HISTORY: Past Medical History:  Diagnosis Date  . Anxiety    treated /w Xanax for mild depression , using 2 times per day, not PRN  . Asthma   . Benign prostatic hypertrophy   . CAD (coronary artery disease)    pt last left heart cath was in Nov 2008. EF was 55% on left ventriculogram. Circumflex was totally occluded after the 1st obtuse marginal with collaterals supplying the distal circumflex. LAD showed luminal irregularities. The stent in LAD was patent. The RCA showed luminal irregularities. The stent in th emid RCA and PDA were patent. There were no inverventions.   . Cancer ([place])    basal cell- face & head  . Chest pain   . Chronic obstructive pulmonary disease (HCC)    asthma  . Cluster headache    relative to sinus problems   . Cluster headache   . Constipation   . Depression   . Depression   . DM2 (diabetes mellitus, type 2) ([place])    well controlled  . GERD (gastroesophageal reflux disease)    hiatal hernia. Patient did hav ea Nissen fundoplication  . History of blood transfusion    need for Bld. transfusion relative to taking NSAIDS  . HTN (hypertension)    pt. followed by [facility], last cardiac visit 2012, one yr. ago  . Hyperlipidemia   . Joint pain   . Lactose intolerance   . Low back pain    chronic  . 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 Obesity   . OSA and COPD overlap syndrome ([place]) 02/25/2015   [employer] 5-20cmH2O autoCPAP  . Osteoarthritis   . Peptic ulcer disease   . Sleep concern    states the study (2003 )was done at [facility] it failed & he was told to return & he never did. Pt. states he has been found by prev. hosp. staff that he has to be told when to breathe & his wife does the same.   Marland Kitchen Spinal fracture    hx of traumatic in Jan 2009 after falling off the roof    PAST SURGICAL HISTORY: Past Surgical History:  Procedure  Laterality Date  . BACK SURGERY     x3- last surgery lumbar- 1998- / fusion   . blepheroplasty     both eyes  . C5-T6 posterior fusion  with Oasis and radius screws  . CARDIAC CATHETERIZATION     2008 & 2012 multiple stents    . cataracts     cataracts removed- /w IOL - both eyes   . ESOPHAGEAL MANOMETRY    . EYE SURGERY    . HARDWARE REMOVAL  02/20/2012   Procedure: HARDWARE REMOVAL;  Surgeon: [name], MD;  Location: [facility];  Service: Neurosurgery;  Laterality: N/A;  Hardware Removal  . HIATAL HERNIA REPAIR    . LAPAROSCOPIC CHOLECYSTECTOMY     & IOC  . LUMBAR SPINE SURGERY     x4  . multiple percutaneous coronary interventions    . NASAL SINUS SURGERY     x2, sees Dr. [name], still having problems, states he uses Benadryl PRN- up to 5 times per day   . right carpal tunnel release    . right temporal artery biopsy    . UMBILICAL HERNIA REPAIR      SOCIAL HISTORY: Social History  Substance Use Topics  . Smoking status: Former Smoker    Quit date: 02/14/1978  . Smokeless tobacco: Never Used  . Alcohol use 0.0 oz/week     Comment: rare    FAMILY HISTORY: Family History  Problem Relation Age of Onset  . Heart failure Mother        in her 37s  . Hypertension Mother   . Heart attack Mother   . Obesity Mother   . Coronary artery disease Father        developed in his 14s  . Hypertension Father   . Heart attack Father   . Heart attack Brother        in there 38s  . Heart attack Brother        in there 14s  . Gout Sister     ROS: Review of Systems  Constitutional: Positive for weight loss.  Musculoskeletal: Positive for back pain.    PHYSICAL EXAM: Blood pressure (!) 130/56, pulse 72, temperature 98.1 F (36.7 C), temperature source Oral, height 5\' 6"  (1.676 m), weight 188 lb (85.3 kg), SpO2 94 %. Body mass index is 30.34 kg/m. Physical Exam  Constitutional: He is oriented to person, place, and time. He appears well-developed and well-nourished.    Cardiovascular: Normal rate.   Pulmonary/Chest: Effort normal.  Musculoskeletal: Normal range of motion.  Neurological: He is oriented to person, place, and time.  Skin: Skin is warm and dry.  Psychiatric: He has a normal mood and affect. His behavior is normal.  Vitals reviewed.   RECENT LABS AND TESTS: BMET    Component Value Date/Time   NA 136 01/13/2017 1038   K 4.2 01/13/2017 1038   CL 96 (L) 01/13/2017 1038   CO2 27 01/13/2017 1038   GLUCOSE 90 01/13/2017 1038   BUN 19 01/13/2017 1038   CREATININE 1.11 01/13/2017 1038   CALCIUM 9.7 01/13/2017 1038   GFRNONAA 66 01/13/2017 1038   GFRAA 76 01/13/2017 1038   Lab Results  Component Value Date   HGBA1C 5.6 01/13/2017   HGBA1C 5.3 10/10/2016   HGBA1C 5.4 07/12/2016   HGBA1C 5.4 04/04/2016   HGBA1C 5.4 12/22/2015   Lab Results  Component Value Date   INSULIN 10.1 01/26/2017   CBC    Component Value Date/Time   WBC 6.9 01/13/2017 1038   RBC 5.10 01/13/2017 1038   HGB 16.1 01/13/2017 1038   HCT 47.5 01/13/2017 1038   PLT 167 01/13/2017 1038   MCV 93.1 01/13/2017  1038   MCH 31.6 01/13/2017 1038   MCHC 33.9 01/13/2017 1038   RDW 14.5 01/13/2017 1038   LYMPHSABS 1,449 01/13/2017 1038   MONOABS 621 01/13/2017 1038   EOSABS 69 01/13/2017 1038   BASOSABS 69 01/13/2017 1038   Iron/TIBC/Ferritin/ %Sat    Component Value Date/Time   IRON 48 05/16/2013 1610   TIBC 306 05/16/2013 1610   IRONPCTSAT 16 (L) 05/16/2013 1610   Lipid Panel     Component Value Date/Time   CHOL 155 01/13/2017 1038   TRIG 154 (H) 01/13/2017 1038   HDL 54 01/13/2017 1038   CHOLHDL 2.9 01/13/2017 1038   VLDL 31 (H) 01/13/2017 1038   LDLCALC 70 01/13/2017 1038   LDLDIRECT 116.0 06/03/2013 1513   Hepatic Function Panel     Component Value Date/Time   PROT 6.9 01/13/2017 1038   ALBUMIN 3.7 01/13/2017 1038   AST 18 01/13/2017 1038   ALT 16 01/13/2017 1038   ALKPHOS 47 01/13/2017 1038   BILITOT 0.6 01/13/2017 1038   BILIDIR 0.1  01/13/2017 1038   IBILI 0.5 01/13/2017 1038      Component Value Date/Time   TSH 0.96 01/13/2017 1038   TSH 0.78 10/10/2016 1206   TSH 1.53 07/12/2016 1419    ASSESSMENT AND PLAN: Arthritis - Chronic Back - Plan: acetaminophen (TYLENOL) 500 MG tablet  Class 1 obesity with serious comorbidity and body mass index (BMI) of 30.0 to 30.9 in adult, unspecified obesity type  PLAN:  Chronic Back Arthritis Garrie agrees to take OTC Tylenol 500 mg every 8 hours as needed for pain. He will continue exercise and weight loss to improve pain and will follow up at the agreed upon time.  Obesity Adolpho is currently in the action stage of change. As such, his goal is to continue with weight loss efforts He has agreed to follow the Category 2 plan + breakfast options [place] has been instructed to work up to a goal of 150 minutes of combined cardio and strengthening exercise per week for weight loss and overall health benefits. We discussed the following Behavioral Modification Strategies today: increasing lean protein intake and decreasing simple carbohydrates   Kalab has agreed to follow up with our clinic in 3 to 4 weeks. He was informed of the importance of frequent follow up visits to maximize his success with intensive lifestyle modifications for his multiple health conditions.  I, Doreene Nest, am acting as transcriptionist for Dennard Nip, MD  I have reviewed the above documentation for accuracy and completeness, and I agree with the above. -Dennard Nip, MD    OBESITY BEHAVIORAL INTERVENTION VISIT  Today's visit was # 3 out of 22.  Starting weight: 190 lbs Starting date: 01/26/17 Today's weight : 188 lbs  Today's date: 02/27/2017 Total lbs lost to date: 2 (Patients must lose 7 lbs in the first 6 months to continue with counseling)   ASK: We discussed the diagnosis of obesity with [place] today and Marcellis agreed to give [name] permission to discuss obesity  behavioral modification therapy today.  ASSESS: Jawaun has the diagnosis of obesity and his BMI today is 30.36 [place] is in the action stage of change   ADVISE: Rye was educated on the multiple health risks of obesity as well as the benefit of weight loss to improve his health. He was advised of the need for long term treatment and the importance of lifestyle modifications.  AGREE: Multiple dietary modification options and treatment options were discussed and  Josian agreed to follow the Category 2 plan + breakfast options We discussed the following Behavioral Modification Strategies today: increasing lean protein intake and decreasing simple carbohydrates

## 2017-03-09 ENCOUNTER — Encounter: Payer: Self-pay | Admitting: Internal Medicine

## 2017-03-13 DIAGNOSIS — M75121 Complete rotator cuff tear or rupture of right shoulder, not specified as traumatic: Secondary | ICD-10-CM | POA: Diagnosis not present

## 2017-03-16 ENCOUNTER — Ambulatory Visit (INDEPENDENT_AMBULATORY_CARE_PROVIDER_SITE_OTHER): Payer: Medicare Other | Admitting: Neurology

## 2017-03-16 ENCOUNTER — Encounter: Payer: Self-pay | Admitting: Neurology

## 2017-03-16 VITALS — BP 133/86 | HR 57 | Ht 66.0 in | Wt 192.0 lb

## 2017-03-16 DIAGNOSIS — F5103 Paradoxical insomnia: Secondary | ICD-10-CM

## 2017-03-16 DIAGNOSIS — F518 Other sleep disorders not due to a substance or known physiological condition: Secondary | ICD-10-CM | POA: Diagnosis not present

## 2017-03-16 DIAGNOSIS — I25810 Atherosclerosis of coronary artery bypass graft(s) without angina pectoris: Secondary | ICD-10-CM

## 2017-03-16 NOTE — Patient Instructions (Signed)

## 2017-03-16 NOTE — Progress Notes (Signed)
SLEEP MEDICINE CLINIC   Provider:  Larey Seat, M D  Primary Care Physician:  [name], MD   Referring Provider: [name], MD    Chief Complaint  Patient presents with  . New Patient (Initial Visit)    pt alone, rm 10, pt had a sleep study 12/2014. PA has insisted that we talk about options.     HPI:  [name] is a 71 y.o. male , seen here as in a referral  from Dr. Melford Aase to re-establish sleep care,  [name] is a 71 year old Caucasian left-handed male who carries the diagnoses of well-controlled blood pressure elevation, hyperlipidemia, prediabetes and vitamin D deficiency. He sees his primary care for regular wellness checks. He had lab tests on 10/10/2016 which returned within normal limits, his medical diagnosis list and his medications were reviewed. He takes Valium as needed for anxiety melatonin at night, Atrovent inhaler, several other medications would not affect his sleep architecture overall sleep time.   He had been referred for a sleep study in 2017 to [facility]. The study took place on 12/23/2014 and the patient achieved a total recording time of 400 minutes with 346 minutes of sleep, about 27.5% REM sleep and was diagnosed with sleep apnea, and interestingly there is no narrative report. His AHI was measured at 8.5% with 3% desaturation and AHI 4.9/hr. at 4 % desaturation. There was no evidence in a sleep study of tachycardia or bradycardia atrial fibrillation her could not find any evidence for prolonged oxygen desaturation. He was a mouth breather- meanwhile has undergone an ENT procedure and practices conscious breathing exercises, he also suffered right shoulder injuries in a fall and his sleep position has changed since.    Chief complaint according to patient : I need to increase my depth of breathing, I have gained weight and lose muscle.    Sleep habits are as follows: he sleeps alone, his wife snores.  He goes  to bed at 10 PM, falls asleep promptly, and sleeps often on the right side , with low volume TV in the back room. Sleeps on one pillow. Bedroom is cool, but not dark and quiet. He feels he dreams a lot. He feels TV noise neutralizes his Tinnitus. He wakes up at 2 AM with a bathroom call, the same happens again at 5 AM- he has trouble to go back to sleep and takes doxylamine and tylenol at that time. He rises at 6-7 am, wakes spontaneous.  He feels not groggy in AM,  he gets up and does crossword puzzles.   Sleep medical history and family sleep history: no body with apnea concerns.   Social history: married, one of 6 siblings, quit smoking in 1979, ETOH- quit 1977, caffeine - no soda, little iced tea, 36 ounces of coffee in AM. 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   Review of Systems: Out of a complete 14 system review, the patient complains of only the following symptoms, and all other reviewed systems are negative. Tinnitus, snoring, fatigued, weaker, not having energy.    Epworth score 3 , Fatigue severity score 31  , depression score n/a    Social History   Social History  . Marital status: Married    Spouse name: N/A  . Number of children: N/A  . Years of education: N/A   Occupational History  . Retired    Social History Main Topics  . Smoking status: Former Audiological scientist  date: 02/14/1978  . Smokeless tobacco: Never Used  . Alcohol use 0.0 oz/week     Comment: rare  . Drug use: No  . Sexual activity: Not on file   Other Topics Concern  . Not on file   Social History Narrative  . No narrative on file    Family History  Problem Relation Age of Onset  . Heart failure Mother        in her 35s  . Hypertension Mother   . Heart attack Mother   . Obesity Mother   . Coronary artery disease Father        developed in his 53s  . Hypertension Father   . Heart attack Father   . Heart attack Brother        in there 35s  . Heart attack Brother        in there 14s    . Gout Sister     Past Medical History:  Diagnosis Date  . Anxiety    treated /w Xanax for mild depression , using 2 times per day, not PRN  . Asthma   . Benign prostatic hypertrophy   . CAD (coronary artery disease)    pt last left heart cath was in Nov 2008. EF was 55% on left ventriculogram. Circumflex was totally occluded after the 1st obtuse marginal with collaterals supplying the distal circumflex. LAD showed luminal irregularities. The stent in LAD was patent. The RCA showed luminal irregularities. The stent in th emid RCA and PDA were patent. There were no inverventions.   . Cancer ([place])    basal cell- face & head  . Chest pain   . Chronic obstructive pulmonary disease (HCC)    asthma  . Cluster headache    relative to sinus problems   . Cluster headache   . Constipation   . Depression   . Depression   . DM2 (diabetes mellitus, type 2) ([place])    well controlled  . GERD (gastroesophageal reflux disease)    hiatal hernia. Patient did hav ea Nissen fundoplication  . History of blood transfusion    need for Bld. transfusion relative to taking NSAIDS  . HTN (hypertension)    pt. followed by [facility], last cardiac visit 2012, one yr. ago  . Hyperlipidemia   . Joint pain   . Lactose intolerance   . Low back pain    chronic  . 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 Obesity   . OSA and COPD overlap syndrome ([place]) 02/25/2015   [facility] 5-20cmH2O autoCPAP  . Osteoarthritis   . Peptic ulcer disease   . Sleep concern    states the study (2003 )was done at [facility]., it failed & he was told to return & he never did. Pt. states he has been found by prev. hosp. staff that he has to be told when to breathe & his wife does the same.   Marland Kitchen Spinal fracture    hx of traumatic in Jan 2009 after falling off the roof    Past Surgical History:  Procedure Laterality Date  . BACK SURGERY     x3- last surgery  lumbar- 1998- / fusion   . blepheroplasty     both eyes  . C5-T6 posterior fusion     with Oasis and radius screws  . CARDIAC CATHETERIZATION     2008 & 2012 multiple stents    .  cataracts     cataracts removed- /w IOL - both eyes   . ESOPHAGEAL MANOMETRY    . EYE SURGERY    . HARDWARE REMOVAL  02/20/2012   Procedure: HARDWARE REMOVAL;  Surgeon: [name], MD;  Location: [facility];  Service: Neurosurgery;  Laterality: N/A;  Hardware Removal  . HIATAL HERNIA REPAIR    . LAPAROSCOPIC CHOLECYSTECTOMY     & IOC  . LUMBAR SPINE SURGERY     x4  . multiple percutaneous coronary interventions    . NASAL SINUS SURGERY     x2, sees Dr. [name], still having problems, states he uses Benadryl PRN- up to 5 times per day   . right carpal tunnel release    . right temporal artery biopsy    . UMBILICAL HERNIA REPAIR      Current Outpatient Prescriptions  Medication Sig Dispense Refill  . acetaminophen (TYLENOL) 500 MG tablet Take 1 tablet (500 mg total) by mouth every 6 (six) hours as needed. 30 tablet 0  . Ascorbic Acid (VITAMIN C) 1000 MG tablet Take 1,000 mg by mouth daily.    Marland Kitchen aspirin EC 81 MG tablet Take 81 mg by mouth daily.    . B Complex Vitamins (B COMPLEX PO) Take by mouth daily.    . bisoprolol-hydrochlorothiazide (ZIAC) 5-6.25 MG tablet Take 1 tablet by mouth daily. 90 tablet 1  . Choline Dihydrogen Citrate (CHOLINE CITRATE) 650 MG TABS Take by mouth.    . clobetasol (TEMOVATE) 0.05 % external solution Apply 1 application topically daily. 50 mL 0  . clopidogrel (PLAVIX) 75 MG tablet Take 1 tablet (75 mg total) by mouth daily. 90 tablet 1  . diazepam (VALIUM) 5 MG tablet Take 1 tablet (5 mg total) by mouth every 8 (eight) hours as needed for anxiety. (Patient taking differently: Take 2.5 mg by mouth every 8 (eight) hours as needed for sedation. ) 90 tablet 0  . fish oil-omega-3 fatty acids 1000 MG capsule Take 2 g by mouth daily.     . Flaxseed, Linseed, (Mccullough SEED OIL) 1000 MG CAPS  Take 1,000 mg by mouth 2 (two) times daily.     . fluocinonide (LIDEX) 0.05 % external solution Apply 1 application topically 2 (two) times daily.     . folic acid (FOLVITE) 119 MCG tablet Take 800 mcg by mouth daily.    Marland Kitchen ipratropium (ATROVENT) 0.03 % nasal spray instill 1 to 2 sprays into each nostril every 6 hours if needed  1  . Magnesium 500 MG TABS Take 500 mg by mouth 3 (three) times daily.     Marland Kitchen MELATONIN PO Take 30 mg by mouth daily.    . meloxicam (MOBIC) 15 MG tablet Take one daily with food for 2 weeks, can take with tylenol, can not take with aleve, iburpofen, then as needed daily for pain (Patient taking differently: 15 mg daily as needed. Take one daily with food for 2 weeks, can take with tylenol, can not take with aleve, iburpofen, then as needed daily for pain) 30 tablet 1  . Multiple Vitamin (MULTIVITAMIN) capsule Take 1 capsule by mouth daily.      . niacin 500 MG tablet Take 500 mg by mouth 2 (two) times daily.    . nitroGLYCERIN (NITROSTAT) 0.4 MG SL tablet DISSOLVE 1 TABLET UNDER TONGUE EVERY 3 TO 5 MINUTES FOR ANGINA 225 tablet 98  . OVER THE COUNTER MEDICATION Takes ALA(alpha lipoic)    . OVER THE COUNTER MEDICATION  Takes lecithin 1200 mg 2 times daily.    Marland Kitchen OVER THE COUNTER MEDICATION Valerian 400 mg 3 tabs at night.    . Turmeric 500 MG CAPS Take 500 mg by mouth 4 (four) times daily.      No current facility-administered medications for this visit.     Allergies as of 03/16/2017 - Review Complete 03/16/2017  Allergen Reaction Noted  . Codeine Itching and Other (See Comments) 02/15/2012  . Morphine and related Other (See Comments) 02/15/2012  . Nsaids Other (See Comments) 02/15/2012  . Crestor [rosuvastatin]  05/15/2013  . Cymbalta [duloxetine hcl]  05/15/2013  . Dilaudid [hydromorphone hcl]  05/15/2013  . Effexor [venlafaxine]  05/15/2013  . Gluten meal  12/22/2015  . Percocet [oxycodone-acetaminophen]  05/15/2013  . Sulfa antibiotics  05/15/2013  . Topamax  [topiramate]  05/15/2013  . Trazodone and nefazodone  05/15/2013  . Adhesive [tape] Other (See Comments)   . Penicillins Rash     Vitals: BP 133/86   Pulse (!) 57   Ht 5\' 6"  (1.676 m)   Wt 192 lb (87.1 kg)   BMI 30.99 kg/m  Last Weight:  Wt Readings from Last 1 Encounters:  03/16/17 192 lb (87.1 kg)   PPJ:KDTO mass index is 30.99 kg/m.     Last Height:   Ht Readings from Last 1 Encounters:  03/16/17 5\' 6"  (1.676 m)    Physical exam:  General: The patient is awake, alert and appears not in acute distress. The patient is well groomed. Head: Normocephalic, atraumatic. Neck is supple. Mallampati 4,  neck circumference:16.5 ". Nasal airflow patent- significant septal deviation,  Retrognathia is seen.  Cardiovascular:  Regular rate and rhythm , without  murmurs or carotid bruit, and without distended neck veins. Respiratory: Lungs are clear to auscultation. Skin:  Without evidence of edema, or rash Trunk: BMI is 31. The patient's posture is erect  Neurologic exam : The patient is awake and alert, oriented to place and time. Attention span & concentration ability appears normal.  Speech is fluent, without dysarthria, dysphonia or aphasia.  Mood and affect are appropriate.  Cranial nerves: Pupils are equal and briskly reactive to light. Funduscopic exam without evidence of pallor or edema. Extraocular movements  in vertical and horizontal planes intact and without nystagmus. Visual fields by finger perimetry are intact. Hearing to finger rub intact. Facial sensation intact to fine touch.Facial motor strength is symmetric and tongue and uvula move midline. Shoulder shrug was symmetrical.   Motor exam:  Normal tone, muscle bulk and symmetric strength in all extremities. Sensory:  Fine touch, pinprick and vibration were tested in all extremities. Proprioception tested in the upper extremities was normal. Coordination: Rapid alternating movements in the fingers/hands was normal.  Finger-to-nose maneuver  normal without evidence of ataxia, dysmetria or tremor. Gait and station: Patient walks without assistive device and is able unassisted to climb up to the exam table. Strength within normal limits.Stance is stable and normal.  Turns with  3-4 Steps.   Deep tendon reflexes: in the  upper and lower extremities are symmetric and intact. Babinski maneuver response is downgoing.   Assessment:  After physical and neurologic examination, review of laboratory studies,  Personal review of imaging studies, reports of other /same  Imaging studies, results of polysomnography and / or neurophysiology testing and pre-existing records as far as provided in visit., my assessment is ;  [name] reports that it is rare that he sleeps supine but in supine position he will wake  up, woken by his own snoring sometimes by choking sensation. He seems to interesting to not sleep supine and prefers to sleep on his right shoulder, but he has some pain. His sleep overall 2 years ago was excellent with a high REM sleep proportion, high sleep efficiency but his perception of his sleep was quite different. I would not put the patient on CPAP for this low AHI as documented in 2017 when I may recommend his snoring treatment if he or his spouse is bothered by snoring. I'm not sure how to work on the sleep perception, but cognitive behavior therapy may be a way to be more conscious of once breathing pattern, sleep rituals and routines. I like for him to not drink caffeine status at lunchtime. I would also eliminate the TV from the bedroom and rather use audio books or another sound tract but is relaxing and calming.  I will offer a repeat sleep study.  There would be no need for doxylamine.      The patient was advised of the nature of the diagnosed disorder , the treatment options and the  risks for general health and wellness arising from not treating the condition.   I spent more than 50 minutes of  face to face time with the patient.  Greater than 50% of time was spent in counseling and coordination of care. We have discussed the diagnosis and differential and I answered the patient's questions.    Plan:  Treatment plan and additional workup :  Cognitive behavior therapy referral, sleep perception, sleep hygiene discussed. Patient declined sleep study.    Larey Seat, MD 87/10/6431, 2:95 PM  Certified in Neurology by ABPN Certified in [place] by [facility] [address], [place], [place] [address]

## 2017-03-20 ENCOUNTER — Ambulatory Visit (INDEPENDENT_AMBULATORY_CARE_PROVIDER_SITE_OTHER): Payer: Medicare Other | Admitting: Physician Assistant

## 2017-03-20 VITALS — BP 135/72 | HR 61 | Temp 97.7°F | Ht 66.0 in | Wt 185.0 lb

## 2017-03-20 DIAGNOSIS — Z6830 Body mass index (BMI) 30.0-30.9, adult: Secondary | ICD-10-CM

## 2017-03-20 DIAGNOSIS — N182 Chronic kidney disease, stage 2 (mild): Secondary | ICD-10-CM

## 2017-03-20 DIAGNOSIS — K5900 Constipation, unspecified: Secondary | ICD-10-CM

## 2017-03-20 DIAGNOSIS — E1122 Type 2 diabetes mellitus with diabetic chronic kidney disease: Secondary | ICD-10-CM

## 2017-03-20 DIAGNOSIS — E669 Obesity, unspecified: Secondary | ICD-10-CM | POA: Diagnosis not present

## 2017-03-21 NOTE — Progress Notes (Signed)
Office: [phone]  /  Fax: [phone]   HPI:   Chief Complaint: OBESITY [name] is here to discuss his progress with his obesity treatment plan. He is on the Category 2 plan and is following his eating plan approximately 75 % of the time. He states he is exercising yard work and gym for 30 to 60 minutes 3 times per week. [name] continues to do well with weight loss and states hunger is well controlled. States he would like more meal planning and cooking ideas.  [name] admits that he is not eating all the assigned calories within the plan because his hunger is very well controlled. His weight is 185 lb (83.9 kg) today and has had a weight loss of 3 pounds over a period of 3 weeks since his last visit. He has lost 5 lbs since starting treatment with [name].  Diabetes II well controlled without medication [name] has a diagnosis of diabetes type II. [name] states he's not really checking blood sugar and denies any hypoglycemic episodes. He has been working on intensive lifestyle modifications including diet, exercise, and weight loss to help control his blood glucose levels.  Constipation [name] notes constipation for the last few weeks, worse since attempting weight loss. He states BM are less frequent and are not hard and painful. He denies hematochezia or melena.    ALLERGIES: Allergies  Allergen Reactions   Codeine Itching and Other (See Comments)    Also hallucinations    Morphine And Related Other (See Comments)    Hallucinations   Nsaids Other (See Comments)    BLEEDING   Crestor [Rosuvastatin]    Cymbalta [Duloxetine Hcl]    Dilaudid [Hydromorphone Hcl]    Effexor [Venlafaxine]    Gluten Meal    Percocet [Oxycodone-Acetaminophen]    Sulfa Antibiotics    Topamax [Topiramate]    Trazodone And Nefazodone    Adhesive [Tape] Other (See Comments)    SKIN PEELS   Penicillins Rash    MEDICATIONS: Current Outpatient Prescriptions on File Prior to Visit    Medication Sig Dispense Refill   acetaminophen (TYLENOL) 500 MG tablet Take 1 tablet (500 mg total) by mouth every 6 (six) hours as needed. 30 tablet 0   Ascorbic Acid (VITAMIN C) 1000 MG tablet Take 1,000 mg by mouth daily.     aspirin EC 81 MG tablet Take 81 mg by mouth daily.     B Complex Vitamins (B COMPLEX PO) Take by mouth daily.     bisoprolol-hydrochlorothiazide (ZIAC) 5-6.25 MG tablet Take 1 tablet by mouth daily. 90 tablet 1   Choline Dihydrogen Citrate (CHOLINE CITRATE) 650 MG TABS Take by mouth.     clobetasol (TEMOVATE) 0.05 % external solution Apply 1 application topically daily. 50 mL 0   clopidogrel (PLAVIX) 75 MG tablet Take 1 tablet (75 mg total) by mouth daily. 90 tablet 1   diazepam (VALIUM) 5 MG tablet Take 1 tablet (5 mg total) by mouth every 8 (eight) hours as needed for anxiety. (Patient taking differently: Take 2.5 mg by mouth at bedtime. ) 90 tablet 0   fish oil-omega-3 fatty acids 1000 MG capsule Take 2 g by mouth daily.      Flaxseed, Linseed, (FLAX SEED OIL) 1000 MG CAPS Take 1,000 mg by mouth 2 (two) times daily.      fluocinonide (LIDEX) 0.05 % external solution Apply 1 application topically 2 (two) times daily.      folic acid (FOLVITE) 315 MCG tablet Take 800 mcg by  mouth daily.     ipratropium (ATROVENT) 0.03 % nasal spray instill 1 to 2 sprays into each nostril every 6 hours if needed  1   Magnesium 500 MG TABS Take 500 mg by mouth 3 (three) times daily.      MELATONIN PO Take 30 mg by mouth daily.     meloxicam (MOBIC) 15 MG tablet Take one daily with food for 2 weeks, can take with tylenol, can not take with aleve, iburpofen, then as needed daily for pain (Patient taking differently: 15 mg daily as needed. Take one daily with food for 2 weeks, can take with tylenol, can not take with aleve, iburpofen, then as needed daily for pain) 30 tablet 1   Multiple Vitamin (MULTIVITAMIN) capsule Take 1 capsule by mouth daily.       niacin 500 MG  tablet Take 500 mg by mouth 2 (two) times daily.     nitroGLYCERIN (NITROSTAT) 0.4 MG SL tablet DISSOLVE 1 TABLET UNDER TONGUE EVERY 3 TO 5 MINUTES FOR ANGINA 225 tablet 98   OVER THE COUNTER MEDICATION Takes ALA(alpha lipoic)     OVER THE COUNTER MEDICATION Takes lecithin 1200 mg 2 times daily.     OVER THE COUNTER MEDICATION Valerian 400 mg 3 tabs at night.     Turmeric 500 MG CAPS Take 500 mg by mouth 4 (four) times daily.      No current facility-administered medications on file prior to visit.     PAST MEDICAL HISTORY: Past Medical History:  Diagnosis Date   Anxiety    treated /w Xanax for mild depression , using 2 times per day, not PRN   Asthma    Benign prostatic hypertrophy    CAD (coronary artery disease)    pt last left heart cath was in Nov 2008. EF was 55% on left ventriculogram. Circumflex was totally occluded after the 1st obtuse marginal with collaterals supplying the distal circumflex. LAD showed luminal irregularities. The stent in LAD was patent. The RCA showed luminal irregularities. The stent in th emid RCA and PDA were patent. There were no inverventions.    Cancer (HCC)    basal cell- face & head   Chest pain    Chronic obstructive pulmonary disease (HCC)    asthma   Cluster headache    relative to sinus problems    Cluster headache    Constipation    Depression    Depression    DM2 (diabetes mellitus, type 2) (HCC)    well controlled   GERD (gastroesophageal reflux disease)    hiatal hernia. Patient did hav ea Nissen fundoplication   History of blood transfusion    need for Bld. transfusion relative to taking NSAIDS   HTN (hypertension)    pt. followed by [facility], last cardiac visit 2012, one yr. 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 Pt. states he has been found by prev. hosp. staff that he has to  be told when to breathe & his wife does the same.    Spinal fracture    hx of traumatic in Jan 2009 after falling off the roof    PAST SURGICAL HISTORY: Past Surgical History:  Procedure Laterality Date   BACK SURGERY     x3- last surgery lumbar- 1998- / fusion    blepheroplasty     both eyes   C5-T6 posterior fusion     with Oasis and radius screws   CARDIAC CATHETERIZATION     2008 & 2012 multiple stents     cataracts     cataracts removed- /w IOL - both eyes    ESOPHAGEAL MANOMETRY     EYE SURGERY     HARDWARE REMOVAL  02/20/2012   Procedure: HARDWARE REMOVAL;  Surgeon: [name], MD;  Location: [facility];  Service: Neurosurgery;  Laterality: N/A;  Hardware Removal   HIATAL HERNIA REPAIR     LAPAROSCOPIC CHOLECYSTECTOMY     & IOC   LUMBAR SPINE SURGERY     x4   multiple percutaneous coronary interventions     NASAL SINUS SURGERY     x2, sees Dr. [name], still having problems, states he uses Benadryl PRN- up to 5 times per day    right carpal tunnel release     right temporal artery biopsy     UMBILICAL HERNIA REPAIR      SOCIAL HISTORY: Social History  Substance Use Topics   Smoking status: Former Smoker    Quit date: 02/14/1978   Smokeless tobacco: Never Used   Alcohol use 0.0 oz/week     Comment: rare    FAMILY HISTORY: Family History  Problem Relation Age of Onset   Heart failure Mother        in her 77s   Hypertension Mother    Heart attack Mother    Obesity Mother    Coronary artery disease Father        developed in his 62s   Hypertension Father    Heart attack Father    Heart attack Brother        in there 9s   Heart attack  Brother        in there 93s   Gout Sister     ROS: Review of Systems  Constitutional: Positive for weight loss.  Gastrointestinal: Positive for constipation. Negative for melena.       Negative hematochezia  Endo/Heme/Allergies:       Negative hypoglycemia    PHYSICAL EXAM: Blood pressure 135/72, pulse 61, temperature 97.7 F (36.5 C), temperature source Oral, height 5\' 6"  (1.676 m), weight 185 lb (83.9 kg), SpO2 (!) 89 %. Body mass index is 29.86 kg/m. Physical Exam  Constitutional: He is oriented to person, place, and time. He appears well-developed and well-nourished.  Cardiovascular: Normal rate.   Pulmonary/Chest: Effort normal.  Musculoskeletal: Normal range of motion.  Neurological: He is oriented to person, place, and time.  Skin: Skin is warm and dry.  Psychiatric: He has a normal mood and affect. His behavior is normal.  Vitals reviewed.   RECENT LABS AND TESTS: BM ET    Component Value Date/Time   NA 136 01/13/2017 1038   K 4.2 01/13/2017 1038   CL 96 (L) 01/13/2017 1038   CO2 27 01/13/2017 1038   GLUCOSE 90 01/13/2017 1038   BUN 19 01/13/2017 1038   CREATININE 1.11 01/13/2017 1038   CALCIUM 9.7 01/13/2017 1038   GFRNONAA 66 01/13/2017  [name] 01/13/2017 1038   Lab Results  Component Value Date   HGBA1C 5.6 01/13/2017   HGBA1C 5.3 10/10/2016   HGBA1C 5.4 07/12/2016   HGBA1C 5.4 04/04/2016   HGBA1C 5.4 12/22/2015   Lab Results  Component Value Date   INSULIN 10.1 01/26/2017   CBC    Component Value Date/Time   WBC 6.9 01/13/2017 1038   RBC 5.10 01/13/2017 1038   HGB 16.1 01/13/2017 1038   HCT 47.5 01/13/2017 1038   PLT 167 01/13/2017 1038   MCV 93.1 01/13/2017 1038   MCH 31.6 01/13/2017 1038   MCHC 33.9 01/13/2017 1038   RDW 14.5 01/13/2017 1038   LYMPHSABS 1,449 01/13/2017 1038   MONOABS 621 01/13/2017 1038   EOSABS 69 01/13/2017 1038   BASOSABS 69 01/13/2017 1038   Iron/TIBC/Ferritin/ %Sat    Component Value Date/Time    IRON 48 05/16/2013 1610   TIBC 306 05/16/2013 1610   IRONPCTSAT 16 (L) 05/16/2013 1610   Lipid Panel     Component Value Date/Time   CHOL 155 01/13/2017 1038   TRIG 154 (H) 01/13/2017 1038   HDL 54 01/13/2017 1038   CHOLHDL 2.9 01/13/2017 1038   VLDL 31 (H) 01/13/2017 1038   LDLCALC 70 01/13/2017 1038   LDLDIRECT 116.0 06/03/2013 1513   Hepatic Function Panel     Component Value Date/Time   PROT 6.9 01/13/2017 1038   ALBUMIN 3.7 01/13/2017 1038   AST 18 01/13/2017 1038   ALT 16 01/13/2017 1038   ALKPHOS 47 01/13/2017 1038   BILITOT 0.6 01/13/2017 1038   BILIDIR 0.1 01/13/2017 1038   IBILI 0.5 01/13/2017 1038      Component Value Date/Time   TSH 0.96 01/13/2017 1038   TSH 0.78 10/10/2016 1206   TSH 1.53 07/12/2016 1419    ASSESSMENT AND PLAN: Controlled type 2 diabetes mellitus with stage 2 chronic kidney disease, without long-term current use of insulin (HCC)  Constipation, unspecified constipation type  Class 1 obesity with serious comorbidity and body mass index (BMI) of 30.0 to 30.9 in adult, unspecified obesity type - starting BMI >30  PLAN:  Diabetes II well controlled without medication [name] has been given extensive diabetes education by myself today including ideal fasting and post-prandial blood glucose readings, individual ideal Hgb A1c goals  and hypoglycemia prevention. We discussed the importance of good blood sugar control to decrease the likelihood of diabetic complications such as nephropathy, neuropathy, limb loss, blindness, coronary artery disease, and death. We discussed the importance of intensive lifestyle modification including diet, exercise and weight loss as the first line treatment for diabetes. [name] agrees to continue his diabetes medications and will follow up at the agreed upon time. [name] agrees to continue with diet and exercise and will follow up at the agreed upon time.  Constipation [name] was informed decrease bowel movement  frequency is normal while losing weight, but stools should not be hard or painful. He was advised to increase his H intake and work on increasing his fiber intake. [name] agrees to take [name] lax 1 capful daily as needed. High fiber foods were discussed today. [name] agrees to follow up with our clinic in 2 weeks.  Obesity [name] is currently in the action stage of change. As such, his goal is to continue with weight loss efforts He has agreed to follow the Category 2 plan [name] has been instructed to work up to a goal of 150 minutes of combined cardio and strengthening exercise per  week for weight loss and overall health benefits. We discussed the following Behavioral Modification Strategies today: increasing lean protein intake and no skipping meals  [name] has agreed to follow up with our clinic in 2 weeks. He was informed of the importance of frequent follow up visits to maximize his success with intensive lifestyle modifications for his multiple health conditions.  I, [name], am acting as transcriptionist for Darrell Duverney, PA-C  I have reviewed the above documentation for accuracy and completeness, and I agree with the above. Darrell [place] man, PA-C  I have reviewed the above note and agree with the plan. Darrell Cooler, MD  OBESITY BEHAVIORAL INTERVENTION VISIT  Today's visit was # 4 out of 22.  Starting weight: 190 lbs Starting date: 01/26/17 Today's weight : 185 lbs Today's date: 03/20/2017 Total lbs lost to date: 5 (Patients must lose 7 lbs in the first 6 months to continue with counseling)   ASK: We discussed the diagnosis of obesity with [name] today and [name] agreed to give [name] permission to discuss obesity behavioral modification therapy today.  ASSESS: [name] has the diagnosis of obesity and his BMI today is 29.87 [name] is in the action stage of change   ADVISE: [name] was educated on the multiple health risks of obesity as well as the benefit  of weight loss to improve his health. He was advised of the need for long term treatment and the importance of lifestyle modifications.  AGREE: Multiple dietary modification options and treatment options were discussed and  [name] agreed to follow the Category 2 plan We discussed the following Behavioral Modification Strategies today: increasing lean protein intake and no skipping meals

## 2017-03-29 ENCOUNTER — Encounter: Payer: Self-pay | Admitting: Internal Medicine

## 2017-04-03 ENCOUNTER — Encounter: Payer: Self-pay | Admitting: *Deleted

## 2017-04-03 ENCOUNTER — Ambulatory Visit (INDEPENDENT_AMBULATORY_CARE_PROVIDER_SITE_OTHER): Payer: Medicare Other | Admitting: Physician Assistant

## 2017-04-03 VITALS — BP 136/82 | HR 57 | Temp 98.3°F | Ht 66.0 in | Wt 185.0 lb

## 2017-04-03 DIAGNOSIS — E66811 Obesity, class 1: Secondary | ICD-10-CM

## 2017-04-03 DIAGNOSIS — Z6830 Body mass index (BMI) 30.0-30.9, adult: Secondary | ICD-10-CM | POA: Diagnosis not present

## 2017-04-03 DIAGNOSIS — E781 Pure hyperglyceridemia: Secondary | ICD-10-CM

## 2017-04-03 DIAGNOSIS — E669 Obesity, unspecified: Secondary | ICD-10-CM
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
## 2017-04-24 ENCOUNTER — Ambulatory Visit (INDEPENDENT_AMBULATORY_CARE_PROVIDER_SITE_OTHER): Payer: Medicare Other | Admitting: Physician Assistant

## 2017-04-24 VITALS — BP 149/79 | HR 63 | Temp 97.9°F | Ht 66.0 in | Wt 184.0 lb

## 2017-04-24 DIAGNOSIS — E669 Obesity, unspecified: Secondary | ICD-10-CM | POA: Diagnosis not present

## 2017-04-24 DIAGNOSIS — Z6830 Body mass index (BMI) 30.0-30.9, adult: Secondary | ICD-10-CM

## 2017-04-24 DIAGNOSIS — I10 Essential (primary) hypertension: Secondary | ICD-10-CM

## 2017-04-24 NOTE — Progress Notes (Signed)
Office: [phone]  /  Fax: [phone]   HPI:   Chief Complaint: OBESITY [name] Mccullough here to discuss his progress with his obesity treatment plan. He Mccullough on the Category 2 plan and Mccullough following his eating plan approximately 70 % of the time. He states he Mccullough walking for 30 minutes 3 times per week. [name] continues to do well with weight loss. Despite increased celebration eating, he Mccullough been mindful of his eating and controlled his portions.  He would like to incorporate more variety to his meals. His weight Mccullough 184 lb (83.5 kg) today and Mccullough had a weight loss of 1 pound over a period of 3 weeks since his last visit. He Mccullough lost 6 lbs since starting treatment with [name].  Hypertension [name] Mccullough a 71 y.o. male with hypertension. [name] blood pressure Mccullough elevated int he office. He states he was late this morning and that stressed him out. Relates that BP at home Mccullough stable. [name] denies chest pain or shortness of breath. He Mccullough working weight loss to help control his blood pressure with the goal of decreasing his risk of heart attack and stroke.  ALLERGIES: Allergies  Allergen Reactions  . Codeine Itching and Other (See Comments)    Also hallucinations   . Morphine And Related Other (See Comments)    Hallucinations  . Nsaids Other (See Comments)    BLEEDING  . Crestor [Rosuvastatin]   . Cymbalta [Duloxetine Hcl]   . Dilaudid [Hydromorphone Hcl]   . Effexor [Venlafaxine]   . Gluten Meal   . Percocet [Oxycodone-Acetaminophen]   . Sulfa Antibiotics   . Topamax [Topiramate]   . Trazodone And Nefazodone   . Adhesive [Tape] Other (See Comments)    SKIN PEELS  . Penicillins Rash    MEDICATIONS: Current Outpatient Medications on File Prior to Visit  Medication Sig Dispense Refill  . acetaminophen (TYLENOL) 500 MG tablet Take 1 tablet (500 mg total) by mouth every 6 (six) hours as needed. 30 tablet 0  . Ascorbic Acid (VITAMIN C) 1000 MG tablet Take 1,000 mg  by mouth daily.    Marland Kitchen aspirin EC 81 MG tablet Take 81 mg by mouth daily.    . B Complex Vitamins (B COMPLEX PO) Take by mouth daily.    . bisoprolol-hydrochlorothiazide (ZIAC) 5-6.25 MG tablet Take 1 tablet by mouth daily. 90 tablet 1  . Choline Dihydrogen Citrate (CHOLINE CITRATE) 650 MG TABS Take by mouth.    . clobetasol (TEMOVATE) 0.05 % external solution Apply 1 application topically daily. 50 mL 0  . clopidogrel (PLAVIX) 75 MG tablet Take 1 tablet (75 mg total) by mouth daily. 90 tablet 1  . diazepam (VALIUM) 5 MG tablet Take 1 tablet (5 mg total) by mouth every 8 (eight) hours as needed for anxiety. (Patient taking differently: Take 2.5 mg by mouth at bedtime. ) 90 tablet 0  . fish oil-omega-3 fatty acids 1000 MG capsule Take 2 g by mouth daily.     . Flaxseed, Linseed, (FLAX SEED OIL) 1000 MG CAPS Take 1,000 mg by mouth 2 (two) times daily.     . fluocinonide (LIDEX) 0.05 % external solution Apply 1 application topically 2 (two) times daily.     . folic acid (FOLVITE) 532 MCG tablet Take 800 mcg by mouth daily.    Marland Kitchen ipratropium (ATROVENT) 0.03 % nasal spray instill 1 to 2 sprays into each nostril every 6 hours if needed  1  . Magnesium  500 MG TABS Take 500 mg by mouth 3 (three) times daily.     Marland Kitchen MELATONIN PO Take 30 mg by mouth daily.    . meloxicam (MOBIC) 15 MG tablet Take one daily with food for 2 weeks, can take with tylenol, can not take with aleve, iburpofen, then as needed daily for pain (Patient taking differently: 15 mg daily as needed. Take one daily with food for 2 weeks, can take with tylenol, can not take with aleve, iburpofen, then as needed daily for pain) 30 tablet 1  . Multiple Vitamin (MULTIVITAMIN) capsule Take 1 capsule by mouth daily.      . niacin 500 MG tablet Take 500 mg by mouth 2 (two) times daily.    . nitroGLYCERIN (NITROSTAT) 0.4 MG SL tablet DISSOLVE 1 TABLET UNDER TONGUE EVERY 3 TO 5 MINUTES FOR ANGINA 225 tablet 98  . OVER THE COUNTER MEDICATION Takes  ALA(alpha lipoic)    . OVER THE COUNTER MEDICATION Takes lecithin 1200 mg 2 times daily.    Marland Kitchen OVER THE COUNTER MEDICATION Valerian 400 mg 3 tabs at night.    . polyethylene glycol (MIRALAX / GLYCOLAX) packet Take 17 g by mouth daily.    . Turmeric 500 MG CAPS Take 500 mg by mouth 4 (four) times daily.      No current facility-administered medications on file prior to visit.     PAST MEDICAL HISTORY: Past Medical History:  Diagnosis Date  . Anxiety    treated /w Xanax for mild depression , using 2 times per day, not PRN  . Asthma   . Benign prostatic hypertrophy   . CAD (coronary artery disease)    pt last left heart cath was in Nov 2008. EF was 55% on left ventriculogram. Circumflex was totally occluded after the 1st obtuse marginal with collaterals supplying the distal circumflex. LAD showed luminal irregularities. The stent in LAD was patent. The RCA showed luminal irregularities. The stent in th emid RCA and PDA were patent. There were no inverventions.   . Cancer ([place])    basal cell- face & head  . Chest pain   . Chronic obstructive pulmonary disease (HCC)    asthma  . Cluster headache    relative to sinus problems   . Cluster headache   . Constipation   . Depression   . Depression   . DM2 (diabetes mellitus, type 2) ([place])    well controlled  . GERD (gastroesophageal reflux disease)    hiatal hernia. Patient did hav ea Nissen fundoplication  . History of blood transfusion    need for Bld. transfusion relative to taking NSAIDS  . HTN (hypertension)    pt. followed by [facility], last cardiac visit 2012, one yr. ago  . Hyperlipidemia   . Joint pain   . Lactose intolerance   . Low back pain    chronic  . 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 Obesity   . OSA and COPD overlap syndrome ([place]) 02/25/2015   [employer] 5-20cmH2O autoCPAP  . Osteoarthritis   . Peptic ulcer disease   . Sleep concern     states the study (2003 )was done at [facility]., it failed & he was told to return & he never did. Pt. states he Mccullough been found by prev. hosp. staff that he Mccullough to be told when to breathe & his wife does the same.   Marland Kitchen  Spinal fracture    hx of traumatic in Jan 2009 after falling off the roof    PAST SURGICAL HISTORY: Past Surgical History:  Procedure Laterality Date  . BACK SURGERY     x3- last surgery lumbar- 1998- / fusion   . blepheroplasty     both eyes  . C5-T6 posterior fusion     with [place]asis and radius screws  . CARDIAC CATHETERIZATION     2008 & 2012 multiple stents    . cataracts     cataracts removed- /w IOL - both eyes   . ESOPHAGEAL MANOMETRY    . EYE SURGERY    . HIATAL HERNIA REPAIR    . LAPAROSCOPIC CHOLECYSTECTOMY     & IOC  . LUMBAR SPINE SURGERY     x4  . multiple percutaneous coronary interventions    . NASAL SINUS SURGERY     x2, sees Dr. [name], still having problems, states he uses Benadryl PRN- up to 5 times per day   . right carpal tunnel release    . right temporal artery biopsy    . UMBILICAL HERNIA REPAIR      SOCIAL HISTORY: Social History   Tobacco Use  . Smoking status: Former Smoker    Last attempt to quit: 02/14/1978    Years since quitting: 39.2  . Smokeless tobacco: Never Used  Substance Use Topics  . Alcohol use: Yes    Alcohol/week: 0.0 oz    Comment: rare  . Drug use: No    FAMILY HISTORY: Family History  Problem Relation Age of Onset  . Heart failure Mother        in her 33s  . Hypertension Mother   . Heart attack Mother   . Obesity Mother   . Coronary artery disease Father        developed in his 68s  . Hypertension Father   . Heart attack Father   . Heart attack Brother        in there 98s  . Heart attack Brother        in there 6s  . Gout Sister     ROS: Review of Systems  Constitutional: Positive for weight loss.  Respiratory: Negative for shortness of breath.   Cardiovascular: Negative for chest pain.     PHYSICAL EXAM: Blood pressure (!) 149/79, pulse 63, temperature 97.9 F (36.6 C), temperature source Oral, height 5\' 6"  (1.676 m), weight 184 lb (83.5 kg), SpO2 98 %. Body mass index Mccullough 29.7 kg/m. Physical Exam  Constitutional: He Mccullough oriented to person, place, and time. He appears well-developed and well-nourished.  Cardiovascular: Normal rate.  Pulmonary/Chest: Effort normal.  Musculoskeletal: Normal range of motion.  Neurological: He Mccullough oriented to person, place, and time.  Skin: Skin Mccullough warm and dry.  Psychiatric: He Mccullough a normal mood and affect. His behavior Mccullough normal.  Vitals reviewed.   RECENT LABS AND TESTS: BMET    Component Value Date/Time   NA 136 01/13/2017 1038   K 4.2 01/13/2017 1038   CL 96 (L) 01/13/2017 1038   CO2 27 01/13/2017 1038   GLUCOSE 90 01/13/2017 1038   BUN 19 01/13/2017 1038   CREATININE 1.11 01/13/2017 1038   CALCIUM 9.7 01/13/2017 1038   GFRNONAA 66 01/13/2017 1038   GFRAA 76 01/13/2017 1038   Lab Results  Component Value Date   HGBA1C 5.6 01/13/2017   HGBA1C 5.3 10/10/2016   HGBA1C 5.4 07/12/2016   HGBA1C 5.4 04/04/2016  HGBA1C 5.4 12/22/2015   Lab Results  Component Value Date   INSULIN 10.1 01/26/2017   CBC    Component Value Date/Time   WBC 6.9 01/13/2017 1038   RBC 5.10 01/13/2017 1038   HGB 16.1 01/13/2017 1038   HCT 47.5 01/13/2017 1038   PLT 167 01/13/2017 1038   MCV 93.1 01/13/2017 1038   MCH 31.6 01/13/2017 1038   MCHC 33.9 01/13/2017 1038   RDW 14.5 01/13/2017 1038   LYMPHSABS 1,449 01/13/2017 1038   MONOABS 621 01/13/2017 1038   EOSABS 69 01/13/2017 1038   BASOSABS 69 01/13/2017 1038   Iron/TIBC/Ferritin/ %Sat    Component Value Date/Time   IRON 48 05/16/2013 1610   TIBC 306 05/16/2013 1610   IRONPCTSAT 16 (L) 05/16/2013 1610   Lipid Panel     Component Value Date/Time   CHOL 155 01/13/2017 1038   TRIG 154 (H) 01/13/2017 1038   HDL 54 01/13/2017 1038   CHOLHDL 2.9 01/13/2017 1038   VLDL 31 (H)  01/13/2017 1038   LDLCALC 70 01/13/2017 1038   LDLDIRECT 116.0 06/03/2013 1513   Hepatic Function Panel     Component Value Date/Time   PROT 6.9 01/13/2017 1038   ALBUMIN 3.7 01/13/2017 1038   AST 18 01/13/2017 1038   ALT 16 01/13/2017 1038   ALKPHOS 47 01/13/2017 1038   BILITOT 0.6 01/13/2017 1038   BILIDIR 0.1 01/13/2017 1038   IBILI 0.5 01/13/2017 1038      Component Value Date/Time   TSH 0.96 01/13/2017 1038   TSH 0.78 10/10/2016 1206   TSH 1.53 07/12/2016 1419    ASSESSMENT AND PLAN: Essential hypertension  Class 1 obesity with serious comorbidity and body mass index (BMI) of 30.0 to 30.9 in adult, unspecified obesity type - beginning BMI of over 30  PLAN:  Hypertension We discussed sodium restriction, working on healthy weight loss, and a regular exercise program as the means to achieve improved blood pressure control. [name] agreed with this plan and agreed to follow up as directed. We will continue to monitor his blood pressure as well as his progress with the above lifestyle modifications. He Mccullough advised to keep a log of BP reading at home and bring in for review.  He will continue his medications as prescribed and will watch for signs of hypotension as he continues his lifestyle modifications. [name] agrees to follow up with our clinic in 2 weeks.  We spent > than 50% of the 15 minute visit on the counseling as documented in the note.  Obesity [name] currently in the action stage of change. As such, his goal Mccullough to continue with weight loss efforts He Mccullough agreed to change to keep a food journal with 1200 calories and 85 grams of protein daily [name] been instructed to work up to a goal of 150 minutes of combined cardio and strengthening exercise per week for weight loss and overall health benefits. We discussed the following Behavioral Modification Strategies today: increasing lean protein intake and keep a strict food journal   [name] agreed to  follow up with our clinic in 2 weeks. He was informed of the importance of frequent follow up visits to maximize his success with intensive lifestyle modifications for his multiple health conditions.  I, Trixie Dredge, am acting as transcriptionist for Lacy Duverney, PA-C  I have reviewed the above documentation for accuracy and completeness, and I agree with the above. -Lacy Duverney, PA-C  I have reviewed the above note and  agree with the plan. -Dennard Nip, MD      Today's visit was # 6 out of 22.  Starting weight: 190 lbs Starting date: 01/26/17 Today's weight : 184 lbs  Today's date: 04/24/2017 Total lbs lost to date: 6 (Patients must lose 7 lbs in the first 6 months to continue with counseling)   ASK: We discussed the diagnosis of obesity with [name] today and [name] agreed to give [name] permission to discuss obesity behavioral modification therapy today.  ASSESS: [name] Mccullough the diagnosis of obesity and his BMI today Mccullough 29.71 [name] Mccullough in the action stage of change   ADVISE: [name] was educated on the multiple health risks of obesity as well as the benefit of weight loss to improve his health. He was advised of the need for long term treatment and the importance of lifestyle modifications.  AGREE: Multiple dietary modification options and treatment options were discussed and  Eldrige agreed to keep a food journal with 1200 calories and 85 grams of protein daily We discussed the following Behavioral Modification Strategies today: increasing lean protein intake and keep a strict food journal

## 2017-04-27 MED ORDER — DIAZEPAM 5 MG PO TABS: ORAL_TABLET | ORAL | 0 refills | Status: DC

## 2017-04-27 MED ORDER — MELOXICAM 15 MG PO TABS: ORAL_TABLET | ORAL | 1 refills | Status: DC

## 2017-04-27 NOTE — Progress Notes (Signed)
[facility]   [name], M.D.     Uvaldo Bristle. Silverio Lay, P.A.-C Liane Comber, [place]                [address] [place], [place] [address] Telephone [phone] Telefax [phone] Comprehensive Evaluation & Examination     This very nice 71 y.o. MWM presents for a  comprehensive evaluation and management of multiple medical co-morbidities.  Patient has been followed for HTN, ASCAD s/p stents, T2_NIDDM  Prediabetes, Hyperlipidemia and Vitamin D Deficiency. Patient has prior hx/o Gout w/o recent flare-ups..     HTN predates since 1999 and I 1992 , he had PCA/Stent. Heart cath in 2012 found patent stents.  Patient's BP has been controlled at home.  Today's BP is at goal - 136/80. Patient denies any cardiac symptoms as chest pain, palpitations, shortness of breath, dizziness or ankle swelling.     Patient's hyperlipidemia is not controlled with diet and medications. Patient denies myalgias or other medication SE's. Last lipids were not at goal:  Lab Results  Component Value Date   CHOL 155 01/13/2017   HDL 54 01/13/2017   LDLCALC 70 01/13/2017   LDLDIRECT 116.0 06/03/2013   TRIG 154 (H) 01/13/2017   CHOLHDL 2.9 01/13/2017      Patient has hx/o T2_NIDDM in 2012 and with better diet &weight loss in 2013 his A1c was 5.8% and in 2014 A1c was 5.5% and patient denies reactive hypoglycemic symptoms, visual blurring, diabetic polys or paresthesias. Last A1c was normal and at goal:  Lab Results  Component Value Date   HGBA1C 5.6 01/13/2017       Finally, patient has history of Vitamin D Deficiency ("45" on treatment in 2008) and last vitamin D was at goal: Lab Results  Component Value Date   VD25OH 89.5 01/26/2017   Current Outpatient Medications on File Prior to Visit  Medication Sig  . acetaminophen (TYLENOL) 500 MG tablet Take 1 tablet (500 mg total) by mouth every 6 (six) hours as needed.   . Ascorbic Acid (VITAMIN C) 1000 MG tablet Take 1,000 mg by mouth daily.  Marland Kitchen aspirin EC 81 MG tab Take  daily.  . B COMPLEX Take  daily.  . bisoprolol-hctz 5-6.25 Take 1 tab daily.  . CHOLINE CITRATE 650 MG  Take daily  . clobetasol  0.05 % ext soln Apply 1 application topically daily.  . clopidogrel  75 MG tablet Take 1 tab daily.  . fish oil-omega-3  1000 MG  Take 2 g by mouth daily.   Marland Kitchen FLAX SEED OIL 1000 MG  Take 2 (two) times daily.   . fluocinonide  0.05 % ext soln Apply 1 application topically 2 (two) times daily.   . folic acid  992 MCG tablet Take 800 mcg by mouth daily.  . ATROVENT nasal spray instill 1 to 2 sprays into each nostril every 6 hours if needed  . Magnesium 500 MG  Take 3  times daily.   Marland Kitchen MELATONIN30 mg   Take daily.  . Multiple Vitamin   Take 1 caps daily.    . niacin 500 MG tablet Take 2 x  daily.  Marland Kitchen NITROSTAT 0.4 MG SL As needed  . ALA(alpha lipoic) Takes 1 x / day  . lecithin 1200 mg Takes 2 times / daily.  . Valerian 400 mg  3 tabs at  night.  Marland Kitchen MIRALAX Take 17 g by mouth daily.  . Turmeric 500 MG CAPS Take 4 x daily.    No current facility-administered medications on file prior to visit.    Allergies  Allergen Reactions  . Codeine Itching and Other (See Comments)    Also hallucinations   . Morphine And Related Other (See Comments)    Hallucinations  . Nsaids Other (See Comments)    BLEEDING  . Crestor [Rosuvastatin]   . Cymbalta [Duloxetine Hcl]   . Dilaudid [Hydromorphone Hcl]   . Effexor [Venlafaxine]   . Gluten Meal   . Percocet [Oxycodone-Acetaminophen]   . Sulfa Antibiotics   . Topamax [Topiramate]   . Trazodone And Nefazodone   . Adhesive [Tape] Other (See Comments)    SKIN PEELS  . Penicillins Rash   Past Medical History:  Diagnosis Date  . Anxiety    treated /w Xanax for mild depression , using 2 times per day, not PRN  . Asthma   . Benign prostatic hypertrophy   . CAD (coronary artery disease)    pt last left heart cath was in  Nov 2008. EF was 55% on left ventriculogram. Circumflex was totally occluded after the 1st obtuse marginal with collaterals supplying the distal circumflex. LAD showed luminal irregularities. The stent in LAD was patent. The RCA showed luminal irregularities. The stent in th emid RCA and PDA were patent. There were no inverventions.   . Cancer ([place])    basal cell- face & head  . Chest pain   . Chronic obstructive pulmonary disease (HCC)    asthma  . Cluster headache    relative to sinus problems   . Cluster headache   . Constipation   . Depression   . Depression   . DM2 (diabetes mellitus, type 2) ([place])    well controlled  . GERD (gastroesophageal reflux disease)    hiatal hernia. Patient did hav ea Nissen fundoplication  . History of blood transfusion    need for Bld. transfusion relative to taking NSAIDS  . HTN (hypertension)    pt. followed by [facility], last cardiac visit 2012, one yr. ago  . Hyperlipidemia   . Joint pain   . Lactose intolerance   . Low back pain    chronic  . 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 Obesity   . OSA and COPD overlap syndrome ([place]) 02/25/2015   [employer] 5-20cmH2O autoCPAP  . Osteoarthritis   . Peptic ulcer disease   . Sleep concern    states the study (2003 )was done at [facility] it failed & he was told to return & he never did. Pt. states he has been found by prev. hosp. staff that he has to be told when to breathe & his wife does the same.   Marland Kitchen Spinal fracture    hx of traumatic in Jan 2009 after falling off the roof   Health Maintenance  Topic Date Due  . Hepatitis C Screening  12-Dec-1945  . INFLUENZA VACCINE  01/11/2017  . FOOT EXAM  04/04/2017  . HEMOGLOBIN A1C  07/16/2017  . URINE MICROALBUMIN  01/26/2018  . OPHTHALMOLOGY EXAM  02/09/2018  . TETANUS/TDAP  12/08/2024  . COLONOSCOPY  04/11/2026  . PNA vac Low Risk Adult  Completed   Immunization History   Administered Date(s) Administered  . DT 12/09/2014  . Influenza Split 02/17/2012  . Influenza, High  Dose Seasonal PF 03/03/2014, 03/11/2015, 04/04/2016  . Pneumococcal Conjugate-13 12/25/2014  . Pneumococcal Polysaccharide-23 01/19/2012  . Pneumococcal-Unspecified 06/13/1997  . Td 06/13/2004  . Zoster 06/14/2007   Past Surgical History:  Procedure Laterality Date  . BACK SURGERY     x3- last surgery lumbar- 1998- / fusion   . blepheroplasty     both eyes  . C5-T6 posterior fusion     with [place] and radius screws  . CARDIAC CATHETERIZATION     2008 & 2012 multiple stents    . cataracts     cataracts removed- /w IOL - both eyes   . ESOPHAGEAL MANOMETRY    . EYE SURGERY    . HARDWARE REMOVAL  02/20/2012   Procedure: HARDWARE REMOVAL;  Surgeon: [name], MD;  Location: [facility];  Service: Neurosurgery;  Laterality: N/A;  Hardware Removal  . HIATAL HERNIA REPAIR    . LAPAROSCOPIC CHOLECYSTECTOMY     & IOC  . LUMBAR SPINE SURGERY     x4  . multiple percutaneous coronary interventions    . NASAL SINUS SURGERY     x2, sees Dr. [name], still having problems, states he uses Benadryl PRN- up to 5 times per day   . right carpal tunnel release    . right temporal artery biopsy    . UMBILICAL HERNIA REPAIR     Family History  Problem Relation Age of Onset  . Heart failure Mother        in her 81s  . Hypertension Mother   . Heart attack Mother   . Obesity Mother   . Coronary artery disease Father        developed in his 68s  . Hypertension Father   . Heart attack Father   . Heart attack Brother        in there 59s  . Heart attack Brother        in there 41s  . Gout Sister    Social History   Socioeconomic History  . Marital status: Married  Social Needs  Occupational History  . Occupation: Retired Chief Strategy Officer  Tobacco Use  . Smoking status: Former Smoker    Last attempt to quit: 02/14/1978    Years since quitting: 39.2  . Smokeless tobacco: Never Used  Substance  and Sexual Activity  . Alcohol use: Yes    Alcohol/week: 0.0 oz    Comment: rare  . Drug use: No  . Sexual activity: Not on file    ROS Constitutional: Denies fever, chills, weight loss/gain, headaches, insomnia,  night sweats or change in appetite. Does c/o fatigue. Eyes: Denies redness, blurred vision, diplopia, discharge, itchy or watery eyes.  ENT: Denies discharge, congestion, post nasal drip, epistaxis, sore throat, earache, hearing loss, dental pain, Tinnitus, Vertigo, Sinus pain or snoring.  Cardio: Denies chest pain, palpitations, irregular heartbeat, syncope, dyspnea, diaphoresis, orthopnea, PND, claudication or edema Respiratory: denies cough, dyspnea, DOE, pleurisy, hoarseness, laryngitis or wheezing.  Gastrointestinal: Denies dysphagia, heartburn, reflux, water brash, pain, cramps, nausea, vomiting, bloating, diarrhea, constipation, hematemesis, melena, hematochezia, jaundice or hemorrhoids Genitourinary: Denies dysuria, nocturia, , discharge, hematuria or flank pain. C/o frequency, urgency, hesitancy and decreased flow.  Musculoskeletal: Denies arthralgia, myalgia, stiffness, Jt. Swelling, pain, limp or strain/sprain. Denies Falls. Skin: Denies puritis, rash, hives, warts, acne, eczema or change in skin lesion Neuro: No weakness, tremor, incoordination, spasms, paresthesia or pain Psychiatric: Denies confusion, memory loss or sensory loss. Denies Depression. Endocrine: Denies change in weight, skin, hair change,  nocturia, and paresthesia, diabetic polys, visual blurring or hyper / hypo glycemic episodes.  Heme/Lymph: No excessive bleeding, bruising or enlarged lymph nodes.  Physical Exam  BP 136/80   Pulse 63   Temp 97.7 F (36.5 C)   Resp 16   Ht 5\' 7"  (1.702 m)   Wt 190 lb 12.8 oz (86.5 kg)   SpO2 97%   BMI 29.88 kg/m   General Appearance: Well nourished and well groomed and in no apparent distress.  Eyes: PERRLA, EOMs, conjunctiva no swelling or erythema, normal  fundi and vessels. Sinuses: No frontal/maxillary tenderness ENT/Mouth: EACs patent / TMs  nl. Nares clear without erythema, swelling, mucoid exudates. Oral hygiene is good. No erythema, swelling, or exudate. Tongue normal, non-obstructing. Tonsils not swollen or erythematous. Hearing normal.  Neck: Supple, thyroid normal. No bruits, nodes or JVD. Respiratory: Respiratory effort normal.  BS equal and clear bilateral without rales, rhonci, wheezing or stridor. Cardio: Heart sounds are normal with regular rate and rhythm and no murmurs, rubs or gallops. Peripheral pulses are normal and equal bilaterally without edema. No aortic or femoral bruits. Chest: moderate severe gibbous deformity..  Abdomen: Soft, with Nl bowel sounds. Nontender, no guarding, rebound, hernias, masses, or organomegaly.  Lymphatics: Non tender without lymphadenopathy.  Genitourinary: No hernias.Testes nl. DRE - prostate 2 (+)  smooth & firm w/o nodules. Musculoskeletal: Full ROM all peripheral extremities, joint stability, 5/5 strength, and normal gait. Skin: Warm and dry without rashes, lesions, cyanosis, clubbing or  ecchymosis.  Neuro: Cranial nerves intact, reflexes equal bilaterally. Normal muscle tone, no cerebellar symptoms. Sensation intact. Sensation intact to touch, vibratory and Monofilament to the toes bilaterally. Pysch: Alert and oriented X 3 with normal affect, insight and judgment appropriate.   Assessment and Plan  1. Essential hypertension  - EKG 12-Lead - [name], RETROPERITNL ABD,  LTD - Urinalysis, Routine w reflex microscopic - Microalbumin / creatinine urine ratio - CBC with Differential/Platelet - BASIC METABOLIC PANEL WITH GFR - Magnesium - TSH  2. Hyperlipidemia, mixed  - EKG 12-Lead - [name], RETROPERITNL ABD,  LTD - Hepatic function panel - Lipid panel - TSH  3. Prediabetes  - EKG 12-Lead - [name], RETROPERITNL ABD,  LTD - Hemoglobin A1c - Insulin, random  4. Vitamin D deficiency  -  VITAMIN D 25 Hydroxy  5. Atherosclerosis of coronary artery bypass graft of native heart without angina pectoris  - EKG 12-Lead  6. COPD mixed type ([place])   7. OSA and COPD overlap syndrome ([place])   8. Benign localized prostatic hyperplasia with lower urinary tract symptoms (LUTS)  - PSA  9. Gout  - Uric acid  10. Screening for colorectal cancer  - POC Hemoccult Bld/Stl   11. Prostate cancer screening  - PSA  12. Screening for ischemic heart disease  - EKG 12-Lead  13. Screening for AAA (aortic abdominal aneurysm)  - [name], RETROPERITNL ABD,  LTD  14. Aortic atherosclerosis (HCC)  - [name], RETROPERITNL ABD,  LTD  15. Smoker  - [name], RETROPERITNL ABD,  LTD  16. Medication management  - Urinalysis, Routine w reflex microscopic - Microalbumin / creatinine urine ratio - Uric acid - CBC with Differential/Platelet - BASIC METABOLIC PANEL WITH GFR - Hepatic function panel - Magnesium - Lipid panel - TSH - Hemoglobin A1c - Insulin, random - VITAMIN D 25 Hydroxy  17. Needs flu shot  - Flu vaccine HIGH DOSE PF  18. Abnormal LFTs  - Hepatitis A antibody, total - Hepatitis B core antibody, total -  Hepatitis B e antibody - Hepatitis B surface antibody - Hepatitis C antibody        Patient was counseled in prudent diet, weight control to achieve/maintain BMI less than 25, BP monitoring, regular exercise and medications as discussed.  Discussed med effects and SE's. Routine screening labs and tests as requested with regular follow-up as recommended. Over 40 minutes of exam, counseling, chart review and high complex critical decision making was performed

## 2017-04-28 ENCOUNTER — Ambulatory Visit (INDEPENDENT_AMBULATORY_CARE_PROVIDER_SITE_OTHER): Payer: Medicare Other | Admitting: Internal Medicine

## 2017-04-28 ENCOUNTER — Encounter: Payer: Self-pay | Admitting: Internal Medicine

## 2017-04-28 VITALS — BP 136/80 | HR 63 | Temp 97.7°F | Resp 16 | Ht 67.0 in | Wt 190.8 lb

## 2017-04-28 DIAGNOSIS — Z136 Encounter for screening for cardiovascular disorders: Secondary | ICD-10-CM | POA: Diagnosis not present

## 2017-04-28 DIAGNOSIS — E559 Vitamin D deficiency, unspecified: Secondary | ICD-10-CM | POA: Diagnosis not present

## 2017-04-28 DIAGNOSIS — M109 Gout, unspecified: Secondary | ICD-10-CM | POA: Diagnosis not present

## 2017-04-28 DIAGNOSIS — Z23 Encounter for immunization: Secondary | ICD-10-CM | POA: Diagnosis not present

## 2017-04-28 DIAGNOSIS — I700 Atherosclerosis of aorta: Secondary | ICD-10-CM | POA: Insufficient documentation

## 2017-04-28 DIAGNOSIS — I25810 Atherosclerosis of coronary artery bypass graft(s) without angina pectoris: Secondary | ICD-10-CM | POA: Diagnosis not present

## 2017-04-28 DIAGNOSIS — F17200 Nicotine dependence, unspecified, uncomplicated: Secondary | ICD-10-CM

## 2017-04-28 DIAGNOSIS — R7303 Prediabetes: Secondary | ICD-10-CM

## 2017-04-28 DIAGNOSIS — E782 Mixed hyperlipidemia: Secondary | ICD-10-CM

## 2017-04-28 DIAGNOSIS — N401 Enlarged prostate with lower urinary tract symptoms: Secondary | ICD-10-CM

## 2017-04-28 DIAGNOSIS — Z79899 Other long term (current) drug therapy: Secondary | ICD-10-CM

## 2017-04-28 DIAGNOSIS — R945 Abnormal results of liver function studies: Secondary | ICD-10-CM | POA: Diagnosis not present

## 2017-04-28 DIAGNOSIS — Z125 Encounter for screening for malignant neoplasm of prostate: Secondary | ICD-10-CM | POA: Diagnosis not present

## 2017-04-28 DIAGNOSIS — J449 Chronic obstructive pulmonary disease, unspecified: Secondary | ICD-10-CM

## 2017-04-28 DIAGNOSIS — I10 Essential (primary) hypertension: Secondary | ICD-10-CM

## 2017-04-28 DIAGNOSIS — R7989 Other specified abnormal findings of blood chemistry: Secondary | ICD-10-CM

## 2017-04-28 DIAGNOSIS — N320 Bladder-neck obstruction: Secondary | ICD-10-CM | POA: Diagnosis not present

## 2017-04-28 DIAGNOSIS — Z1212 Encounter for screening for malignant neoplasm of rectum: Secondary | ICD-10-CM

## 2017-04-28 DIAGNOSIS — Z1211 Encounter for screening for malignant neoplasm of colon: Secondary | ICD-10-CM

## 2017-04-28 DIAGNOSIS — G4733 Obstructive sleep apnea (adult) (pediatric): Secondary | ICD-10-CM

## 2017-04-28 MED ORDER — TAMSULOSIN HCL 0.4 MG PO CAPS: ORAL_CAPSULE | ORAL | 3 refills | Status: DC

## 2017-04-28 NOTE — Patient Instructions (Signed)

## 2017-04-30 ENCOUNTER — Other Ambulatory Visit: Payer: Self-pay | Admitting: Internal Medicine

## 2017-04-30 DIAGNOSIS — E782 Mixed hyperlipidemia: Secondary | ICD-10-CM

## 2017-04-30 DIAGNOSIS — I25810 Atherosclerosis of coronary artery bypass graft(s) without angina pectoris: Secondary | ICD-10-CM

## 2017-04-30 MED ORDER — EZETIMIBE 10 MG PO TABS: 10.0000 mg | ORAL_TABLET | Freq: Every day | ORAL | 1 refills | Status: DC

## 2017-05-01 LAB — MICROALBUMIN / CREATININE URINE RATIO
Creatinine, Urine: 49 mg/dL (ref 20–320)
MICROALB UR: 14.8 mg/dL
MICROALB/CREAT RATIO: 302 ug/mg{creat} — AB (ref <30)

## 2017-05-01 LAB — URINALYSIS, ROUTINE W REFLEX MICROSCOPIC
BACTERIA UA: NONE SEEN /HPF
Bilirubin Urine: NEGATIVE
Glucose, UA: NEGATIVE
HYALINE CAST: NONE SEEN /LPF
Hgb urine dipstick: NEGATIVE
KETONES UR: NEGATIVE
LEUKOCYTES UA: NEGATIVE
Nitrite: NEGATIVE
PH: 7.5 (ref 5.0–8.0)
RBC / HPF: NONE SEEN /HPF (ref 0–2)
SQUAMOUS EPITHELIAL / LPF: NONE SEEN /HPF (ref <=5)
Specific Gravity, Urine: 1.01 (ref 1.001–1.03)
WBC UA: NONE SEEN /HPF (ref 0–5)

## 2017-05-01 LAB — BASIC METABOLIC PANEL WITH GFR
BUN: 17 mg/dL (ref 7–25)
CO2: 31 mmol/L (ref 20–32)
Calcium: 9.4 mg/dL (ref 8.6–10.3)
Chloride: 98 mmol/L (ref 98–110)
Creat: 1.15 mg/dL (ref 0.70–1.18)
GFR, EST NON AFRICAN AMERICAN: 63 mL/min/{1.73_m2} (ref >=60)
GFR, Est African American: 73 mL/min/{1.73_m2} (ref >=60)
GLUCOSE: 92 mg/dL (ref 65–99)
POTASSIUM: 3.9 mmol/L (ref 3.5–5.3)
SODIUM: 136 mmol/L (ref 135–146)

## 2017-05-01 LAB — CBC WITH DIFFERENTIAL/PLATELET
BASOS ABS: 29 {cells}/uL (ref 0–200)
Basophils Relative: 0.6 %
EOS PCT: 1.9 %
Eosinophils Absolute: 91 cells/uL (ref 15–500)
HCT: 49.7 % (ref 38.5–50.0)
Hemoglobin: 17.2 g/dL — ABNORMAL HIGH (ref 13.2–17.1)
Lymphs Abs: 1781 cells/uL (ref 850–3900)
MCH: 30.9 pg (ref 27.0–33.0)
MCHC: 34.6 g/dL (ref 32.0–36.0)
MCV: 89.4 fL (ref 80.0–100.0)
MONOS PCT: 10.1 %
MPV: 10.6 fL (ref 7.5–12.5)
NEUTROS PCT: 50.3 %
Neutro Abs: 2414 cells/uL (ref 1500–7800)
Platelets: 166 10*3/uL (ref 140–400)
RBC: 5.56 10*6/uL (ref 4.20–5.80)
RDW: 12.8 % (ref 11.0–15.0)
TOTAL LYMPHOCYTE: 37.1 %
WBC mixed population: 485 cells/uL (ref 200–950)
WBC: 4.8 10*3/uL (ref 3.8–10.8)

## 2017-05-01 LAB — MAGNESIUM: MAGNESIUM: 1.8 mg/dL (ref 1.5–2.5)

## 2017-05-01 LAB — HEPATIC FUNCTION PANEL
AG Ratio: 1.3 (calc) (ref 1.0–2.5)
ALBUMIN MSPROF: 4 g/dL (ref 3.6–5.1)
ALT: 16 U/L (ref 9–46)
AST: 22 U/L (ref 10–35)
Alkaline phosphatase (APISO): 43 U/L (ref 40–115)
BILIRUBIN INDIRECT: 0.6 mg/dL (ref 0.2–1.2)
Bilirubin, Direct: 0.1 mg/dL (ref 0.0–0.2)
Globulin: 3 g/dL (calc) (ref 1.9–3.7)
TOTAL PROTEIN: 7 g/dL (ref 6.1–8.1)
Total Bilirubin: 0.7 mg/dL (ref 0.2–1.2)

## 2017-05-01 LAB — HEPATITIS C ANTIBODY
Hepatitis C Ab: NONREACTIVE
SIGNAL TO CUT-OFF: 0.01 (ref <1.00)

## 2017-05-01 LAB — PSA: PSA: 2.2 ng/mL (ref <=4.0)

## 2017-05-01 LAB — LIPID PANEL
CHOLESTEROL: 193 mg/dL (ref <200)
HDL: 49 mg/dL (ref >40)
LDL Cholesterol (Calc): 116 mg/dL (calc) — ABNORMAL HIGH
Non-HDL Cholesterol (Calc): 144 mg/dL (calc) — ABNORMAL HIGH (ref <130)
TRIGLYCERIDES: 167 mg/dL — AB (ref <150)
Total CHOL/HDL Ratio: 3.9 (calc) (ref <5.0)

## 2017-05-01 LAB — URIC ACID: URIC ACID, SERUM: 8 mg/dL (ref 4.0–8.0)

## 2017-05-01 LAB — HEPATITIS B E ANTIBODY: Hep B E Ab: NONREACTIVE

## 2017-05-01 LAB — HEMOGLOBIN A1C
EAG (MMOL/L): 6.2 (calc)
Hgb A1c MFr Bld: 5.5 % of total Hgb (ref <5.7)
Mean Plasma Glucose: 111 (calc)

## 2017-05-01 LAB — TSH: TSH: 1.94 mIU/L (ref 0.40–4.50)

## 2017-05-01 LAB — HEPATITIS B CORE ANTIBODY, TOTAL: Hep B Core Total Ab: NONREACTIVE

## 2017-05-01 LAB — HEPATITIS A ANTIBODY, TOTAL: HEPATITIS A AB,TOTAL: NONREACTIVE

## 2017-05-01 LAB — HEPATITIS B SURFACE ANTIBODY,QUALITATIVE: HEP B S AB: NONREACTIVE

## 2017-05-01 LAB — VITAMIN D 25 HYDROXY (VIT D DEFICIENCY, FRACTURES): VIT D 25 HYDROXY: 102 ng/mL — AB (ref 30–100)

## 2017-05-01 LAB — INSULIN, RANDOM: Insulin: 19.9 u[IU]/mL — ABNORMAL HIGH (ref 2.0–19.6)

## 2017-05-10 ENCOUNTER — Ambulatory Visit (INDEPENDENT_AMBULATORY_CARE_PROVIDER_SITE_OTHER): Payer: Medicare Other | Admitting: Physician Assistant

## 2017-05-10 VITALS — BP 133/74 | HR 62 | Temp 98.3°F | Ht 67.0 in | Wt 185.0 lb

## 2017-05-10 DIAGNOSIS — E669 Obesity, unspecified: Secondary | ICD-10-CM

## 2017-05-10 DIAGNOSIS — Z6830 Body mass index (BMI) 30.0-30.9, adult: Secondary | ICD-10-CM | POA: Diagnosis not present

## 2017-05-10 DIAGNOSIS — I10 Essential (primary) hypertension: Secondary | ICD-10-CM | POA: Diagnosis not present

## 2017-05-10 NOTE — Progress Notes (Signed)
Office: [phone]  /  Fax: [phone]   HPI:   Chief Complaint: OBESITY [name] is here to discuss his progress with his obesity treatment plan. He is on the keep a food journal with 1200 calories and 85 grams of protein daily and is following his eating plan approximately 50 % of the time. He states he is walking and upper body strengthening for 30 minutes 3 times per week. [name] is retaining fluids. He is mindful of his eating and controls his portions. He would like more meal planning ideas.  His weight is 185 lb (83.9 kg) today and has gained 1 pound since his last visit. He has lost 5 lbs since starting treatment with [name].  Hypertension [name] is a 71 y.o. male with hypertension. [name]'s blood pressure is stable and he denies chest pain or shortness of breath. He is working weight loss to help control his blood pressure with the goal of decreasing his risk of heart attack and stroke. [name]'s blood pressure is not currently controlled.  ALLERGIES: Allergies  Allergen Reactions  . Codeine Itching and Other (See Comments)    Also hallucinations   . Morphine And Related Other (See Comments)    Hallucinations  . Nsaids Other (See Comments)    BLEEDING  . Crestor [Rosuvastatin]   . Cymbalta [Duloxetine Hcl]   . Dilaudid [Hydromorphone Hcl]   . Effexor [Venlafaxine]   . Gluten Meal   . Percocet [Oxycodone-Acetaminophen]   . Sulfa Antibiotics   . Topamax [Topiramate]   . Trazodone And Nefazodone   . Adhesive [Tape] Other (See Comments)    SKIN PEELS  . Penicillins Rash    MEDICATIONS: Current Outpatient Medications on File Prior to Visit  Medication Sig Dispense Refill  . acetaminophen (TYLENOL) 500 MG tablet Take 1 tablet (500 mg total) by mouth every 6 (six) hours as needed. 30 tablet 0  . Ascorbic Acid (VITAMIN C) 1000 MG tablet Take 1,000 mg by mouth daily.    Marland Kitchen aspirin EC 81 MG tablet Take 81 mg by mouth daily.    . B Complex Vitamins (B COMPLEX  PO) Take by mouth daily.    . bisoprolol-hydrochlorothiazide (ZIAC) 5-6.25 MG tablet Take 1 tablet by mouth daily. 90 tablet 1  . Choline Dihydrogen Citrate (CHOLINE CITRATE) 650 MG TABS Take by mouth.    . clobetasol (TEMOVATE) 0.05 % external solution Apply 1 application topically daily. 50 mL 0  . clopidogrel (PLAVIX) 75 MG tablet Take 1 tablet (75 mg total) by mouth daily. 90 tablet 1  . ezetimibe (ZETIA) 10 MG tablet Take 1 tablet (10 mg total) daily by mouth. 90 tablet 1  . fish oil-omega-3 fatty acids 1000 MG capsule Take 2 g by mouth daily.     . Flaxseed, Linseed, (FLAX SEED OIL) 1000 MG CAPS Take 1,000 mg by mouth 2 (two) times daily.     . fluocinonide (LIDEX) 0.05 % external solution Apply 1 application topically 2 (two) times daily.     . folic acid (FOLVITE) 092 MCG tablet Take 800 mcg by mouth daily.    Marland Kitchen ipratropium (ATROVENT) 0.03 % nasal spray instill 1 to 2 sprays into each nostril every 6 hours if needed  1  . Magnesium 500 MG TABS Take 500 mg by mouth 3 (three) times daily.     Marland Kitchen MELATONIN PO Take 30 mg by mouth daily.    . Multiple Vitamin (MULTIVITAMIN) capsule Take 1 capsule by mouth daily.      Marland Kitchen  niacin 500 MG tablet Take 500 mg by mouth 2 (two) times daily.    . nitroGLYCERIN (NITROSTAT) 0.4 MG SL tablet DISSOLVE 1 TABLET UNDER TONGUE EVERY 3 TO 5 MINUTES FOR ANGINA 225 tablet 98  . OVER THE COUNTER MEDICATION Takes ALA(alpha lipoic)    . OVER THE COUNTER MEDICATION Takes lecithin 1200 mg 2 times daily.    Marland Kitchen OVER THE COUNTER MEDICATION Valerian 400 mg 3 tabs at night.    . polyethylene glycol (MIRALAX / GLYCOLAX) packet Take 17 g by mouth daily.    . tamsulosin (FLOMAX) 0.4 MG CAPS capsule Take 1 capsule daily for Prostate 90 capsule 3  . Turmeric 500 MG CAPS Take 500 mg by mouth 4 (four) times daily.      No current facility-administered medications on file prior to visit.     PAST MEDICAL HISTORY: Past Medical History:  Diagnosis Date  . Anxiety    treated /w  Xanax for mild depression , using 2 times per day, not PRN  . Asthma   . Benign prostatic hypertrophy   . CAD (coronary artery disease)    pt last left heart cath was in Nov 2008. EF was 55% on left ventriculogram. Circumflex was totally occluded after the 1st obtuse marginal with collaterals supplying the distal circumflex. LAD showed luminal irregularities. The stent in LAD was patent. The RCA showed luminal irregularities. The stent in th emid RCA and PDA were patent. There were no inverventions.   . Cancer ([place])    basal cell- face & head  . Chest pain   . Chronic obstructive pulmonary disease (HCC)    asthma  . Cluster headache    relative to sinus problems   . Cluster headache   . Constipation   . Depression   . Depression   . DM2 (diabetes mellitus, type 2) ([place])    well controlled  . GERD (gastroesophageal reflux disease)    hiatal hernia. Patient did hav ea Nissen fundoplication  . History of blood transfusion    need for Bld. transfusion relative to taking NSAIDS  . HTN (hypertension)    pt. followed by [facility], last cardiac visit 2012, one yr. ago  . Hyperlipidemia   . Joint pain   . Lactose intolerance   . Low back pain    chronic  . 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 Obesity   . OSA and COPD overlap syndrome ([place]) 02/25/2015   [employer] 5-20cmH2O autoCPAP  . Osteoarthritis   . Peptic ulcer disease   . Sleep concern    states the study (2003 )was done at [facility] it failed & he was told to return & he never did. Pt. states he has been found by prev. hosp. staff that he has to be told when to breathe & his wife does the same.   Marland Kitchen Spinal fracture    hx of traumatic in Jan 2009 after falling off the roof    PAST SURGICAL HISTORY: Past Surgical History:  Procedure Laterality Date  . BACK SURGERY     x3- last surgery lumbar- 1998- / fusion   . blepheroplasty     both eyes  . C5-T6  posterior fusion     with Oasis and radius screws  . CARDIAC CATHETERIZATION     2008 & 2012 multiple stents    . cataracts     cataracts removed- /w IOL -  both eyes   . ESOPHAGEAL MANOMETRY    . EYE SURGERY    . HARDWARE REMOVAL  02/20/2012   Procedure: HARDWARE REMOVAL;  Surgeon: [name], MD;  Location: [facility];  Service: Neurosurgery;  Laterality: N/A;  Hardware Removal  . HIATAL HERNIA REPAIR    . LAPAROSCOPIC CHOLECYSTECTOMY     & IOC  . LUMBAR SPINE SURGERY     x4  . multiple percutaneous coronary interventions    . NASAL SINUS SURGERY     x2, sees Dr. [name], still having problems, states he uses Benadryl PRN- up to 5 times per day   . right carpal tunnel release    . right temporal artery biopsy    . UMBILICAL HERNIA REPAIR      SOCIAL HISTORY: Social History   Tobacco Use  . Smoking status: Former Smoker    Last attempt to quit: 02/14/1978    Years since quitting: 39.2  . Smokeless tobacco: Never Used  Substance Use Topics  . Alcohol use: Yes    Alcohol/week: 0.0 oz    Comment: rare  . Drug use: No    FAMILY HISTORY: Family History  Problem Relation Age of Onset  . Heart failure Mother        in her 63s  . Hypertension Mother   . Heart attack Mother   . Obesity Mother   . Coronary artery disease Father        developed in his 54s  . Hypertension Father   . Heart attack Father   . Heart attack Brother        in there 69s  . Heart attack Brother        in there 51s  . Gout Sister     ROS: Review of Systems  Constitutional: Negative for weight loss.  Respiratory: Negative for shortness of breath.   Cardiovascular: Negative for chest pain.    PHYSICAL EXAM: Blood pressure 133/74, pulse 62, temperature 98.3 F (36.8 C), temperature source Oral, height 5\' 7"  (1.702 m), weight 185 lb (83.9 kg), SpO2 96 %. Body mass index is 28.98 kg/m. Physical Exam  Constitutional: He is oriented to person, place, and time. He appears well-developed and  well-nourished.  Cardiovascular: Normal rate.  Pulmonary/Chest: Effort normal.  Musculoskeletal: Normal range of motion.  Neurological: He is oriented to person, place, and time.  Skin: Skin is warm and dry.  Psychiatric: He has a normal mood and affect. His behavior is normal.  Vitals reviewed.   RECENT LABS AND TESTS: BMET    Component Value Date/Time   NA 136 04/28/2017 0943   K 3.9 04/28/2017 0943   CL 98 04/28/2017 0943   CO2 31 04/28/2017 0943   GLUCOSE 92 04/28/2017 0943   BUN 17 04/28/2017 0943   CREATININE 1.15 04/28/2017 0943   CALCIUM 9.4 04/28/2017 0943   GFRNONAA 63 04/28/2017 0943   GFRAA 73 04/28/2017 0943   Lab Results  Component Value Date   HGBA1C 5.5 04/28/2017   HGBA1C 5.6 01/13/2017   HGBA1C 5.3 10/10/2016   HGBA1C 5.4 07/12/2016   HGBA1C 5.4 04/04/2016   Lab Results  Component Value Date   INSULIN 10.1 01/26/2017   CBC    Component Value Date/Time   WBC 4.8 04/28/2017 0943   RBC 5.56 04/28/2017 0943   HGB 17.2 (H) 04/28/2017 0943   HCT 49.7 04/28/2017 0943   PLT 166 04/28/2017 0943   MCV 89.4 04/28/2017 0943   MCH 30.9  04/28/2017 0943   MCHC 34.6 04/28/2017 0943   RDW 12.8 04/28/2017 0943   LYMPHSABS 1,781 04/28/2017 0943   MONOABS 621 01/13/2017 1038   EOSABS 91 04/28/2017 0943   BASOSABS 29 04/28/2017 0943   Iron/TIBC/Ferritin/ %Sat    Component Value Date/Time   IRON 48 05/16/2013 1610   TIBC 306 05/16/2013 1610   IRONPCTSAT 16 (L) 05/16/2013 1610   Lipid Panel     Component Value Date/Time   CHOL 193 04/28/2017 0943   TRIG 167 (H) 04/28/2017 0943   HDL 49 04/28/2017 0943   CHOLHDL 3.9 04/28/2017 0943   VLDL 31 (H) 01/13/2017 1038   LDLCALC 70 01/13/2017 1038   LDLDIRECT 116.0 06/03/2013 1513   Hepatic Function Panel     Component Value Date/Time   PROT 7.0 04/28/2017 0943   ALBUMIN 3.7 01/13/2017 1038   AST 22 04/28/2017 0943   ALT 16 04/28/2017 0943   ALKPHOS 47 01/13/2017 1038   BILITOT 0.7 04/28/2017 0943    BILIDIR 0.1 04/28/2017 0943   IBILI 0.6 04/28/2017 0943      Component Value Date/Time   TSH 1.94 04/28/2017 0943   TSH 0.96 01/13/2017 1038   TSH 0.78 10/10/2016 1206    ASSESSMENT AND PLAN: Essential hypertension  Class 1 obesity with serious comorbidity and body mass index (BMI) of 30.0 to 30.9 in adult, unspecified obesity type  PLAN:  Hypertension We discussed sodium restriction, working on healthy weight loss, and a regular exercise program as the means to achieve improved blood pressure control. [name] agreed with this plan and agreed to follow up as directed. We will continue to monitor his blood pressure as well as his progress with the above lifestyle modifications. He will continue his medications as prescribed and will watch for signs of hypotension as he continues his lifestyle modifications. [name] agrees to follow up with our clinic in 2 weeks.  We spent > than 50% of the 15 minute visit on the counseling as documented in the note.  Obesity [name] is currently in the action stage of change. As such, his goal is to continue with weight loss efforts He has agreed to keep a food journal with 1200 calories and 85 grams of protein daily [name] has been instructed to work up to a goal of 150 minutes of combined cardio and strengthening exercise per week for weight loss and overall health benefits. We discussed the following Behavioral Modification Strategies today: increasing lean protein intake and work on meal planning and easy cooking plans   [name] has agreed to follow up with our clinic in 2 weeks. He was informed of the importance of frequent follow up visits to maximize his success with intensive lifestyle modifications for his multiple health conditions.  I, Trixie Dredge, am acting as transcriptionist for Lacy Duverney, PA-C  I have reviewed the above documentation for accuracy and completeness, and I agree with the above. -Lacy Duverney, PA-C  I have reviewed  the above note and agree with the plan. -Dennard Nip, MD     Today's visit was # 7 out of 22.  Starting weight: 190 lbs Starting date: 01/26/17 Today's weight : 185 lbs  Today's date: 05/10/2017 Total lbs lost to date: 5 (Patients must lose 7 lbs in the first 6 months to continue with counseling)   ASK: We discussed the diagnosis of obesity with [name] today and [name] agreed to give [name] permission to discuss obesity behavioral modification therapy today.  ASSESS: [name] has  the diagnosis of obesity and his BMI today is 28.97 [name] is in the action stage of change   ADVISE: [name] was educated on the multiple health risks of obesity as well as the benefit of weight loss to improve his health. He was advised of the need for long term treatment and the importance of lifestyle modifications.  AGREE: Multiple dietary modification options and treatment options were discussed and  [name] agreed to keep a food journal with 1200 calories and 85 grams of protein daily We discussed the following Behavioral Modification Strategies today: increasing lean protein intake and work on meal planning and easy cooking plans

## 2017-05-19 ENCOUNTER — Other Ambulatory Visit: Payer: Self-pay | Admitting: Internal Medicine

## 2017-05-19 MED ORDER — MELOXICAM 15 MG PO TABS: ORAL_TABLET | ORAL | 0 refills | Status: DC

## 2017-05-22 ENCOUNTER — Ambulatory Visit (INDEPENDENT_AMBULATORY_CARE_PROVIDER_SITE_OTHER): Payer: Medicare Other | Admitting: Physician Assistant

## 2017-05-24 ENCOUNTER — Ambulatory Visit (INDEPENDENT_AMBULATORY_CARE_PROVIDER_SITE_OTHER): Payer: Medicare Other | Admitting: Physician Assistant

## 2017-05-24 VITALS — BP 146/73 | HR 61 | Temp 98.7°F | Ht 67.0 in | Wt 187.0 lb

## 2017-05-24 DIAGNOSIS — E669 Obesity, unspecified: Secondary | ICD-10-CM | POA: Diagnosis not present

## 2017-05-24 DIAGNOSIS — I10 Essential (primary) hypertension: Secondary | ICD-10-CM | POA: Diagnosis not present

## 2017-05-24 DIAGNOSIS — Z6830 Body mass index (BMI) 30.0-30.9, adult: Secondary | ICD-10-CM

## 2017-05-24 NOTE — Progress Notes (Signed)
Office: [phone]  /  Fax: [phone]   HPI:   Chief Complaint: OBESITY [name] is here to discuss his progress with his obesity treatment plan. He is on the keep a food journal with 1200 calories and 85 grams of protein daily and is following his eating plan approximately 60 % of the time. He states he is going to the gym, doing weights, nautilus, and treadmill for 30 minutes 2 times per week. [name] is retaining fluids. He has not been keeping up with his protein and has noticed increase in cravings.  His weight is 187 lb (84.8 kg) today and has gained 2 pounds since his last visit. He has lost 3 lbs since starting treatment with [name].  Hypertension [name] is a 71 y.o. male with hypertension. [name]'s blood pressure is elevated. He states he was running late to his appointment and states his blood pressure at home is stable. He denies any adjustments to his medications today. He denies chest pain or shortness of breath. He is working weight loss to help control his blood pressure with the goal of decreasing his risk of heart attack and stroke. [name]'s blood pressure is not currently controlled.  ALLERGIES: Allergies  Allergen Reactions  . Codeine Itching and Other (See Comments)    Also hallucinations   . Morphine And Related Other (See Comments)    Hallucinations  . Nsaids Other (See Comments)    BLEEDING  . Crestor [Rosuvastatin]   . Cymbalta [Duloxetine Hcl]   . Dilaudid [Hydromorphone Hcl]   . Effexor [Venlafaxine]   . Gluten Meal   . Percocet [Oxycodone-Acetaminophen]   . Sulfa Antibiotics   . Topamax [Topiramate]   . Trazodone And Nefazodone   . Adhesive [Tape] Other (See Comments)    SKIN PEELS  . Penicillins Rash    MEDICATIONS: Current Outpatient Medications on File Prior to Visit  Medication Sig Dispense Refill  . acetaminophen (TYLENOL) 500 MG tablet Take 1 tablet (500 mg total) by mouth every 6 (six) hours as needed. 30 tablet 0  . Ascorbic  Acid (VITAMIN C) 1000 MG tablet Take 1,000 mg by mouth daily.    Marland Kitchen aspirin EC 81 MG tablet Take 81 mg by mouth daily.    . B Complex Vitamins (B COMPLEX PO) Take by mouth daily.    . bisoprolol-hydrochlorothiazide (ZIAC) 5-6.25 MG tablet Take 1 tablet by mouth daily. 90 tablet 1  . Choline Dihydrogen Citrate (CHOLINE CITRATE) 650 MG TABS Take by mouth.    . clobetasol (TEMOVATE) 0.05 % external solution Apply 1 application topically daily. 50 mL 0  . clopidogrel (PLAVIX) 75 MG tablet Take 1 tablet (75 mg total) by mouth daily. 90 tablet 1  . ezetimibe (ZETIA) 10 MG tablet Take 1 tablet (10 mg total) daily by mouth. 90 tablet 1  . fish oil-omega-3 fatty acids 1000 MG capsule Take 2 g by mouth daily.     . Flaxseed, Linseed, (FLAX SEED OIL) 1000 MG CAPS Take 1,000 mg by mouth 2 (two) times daily.     . fluocinonide (LIDEX) 0.05 % external solution Apply 1 application topically 2 (two) times daily.     . folic acid (FOLVITE) 128 MCG tablet Take 800 mcg by mouth daily.    Marland Kitchen ipratropium (ATROVENT) 0.03 % nasal spray instill 1 to 2 sprays into each nostril every 6 hours if needed  1  . Magnesium 500 MG TABS Take 500 mg by mouth 3 (three) times daily.     Marland Kitchen  MELATONIN PO Take 30 mg by mouth daily.    . meloxicam (MOBIC) 15 MG tablet Take 1/2 to 1 tablet daily with food for pain & inflammation - limit to 4 tablets /week to prevent Kidney Damage 90 tablet 0  . Multiple Vitamin (MULTIVITAMIN) capsule Take 1 capsule by mouth daily.      . niacin 500 MG tablet Take 500 mg by mouth 2 (two) times daily.    . nitroGLYCERIN (NITROSTAT) 0.4 MG SL tablet DISSOLVE 1 TABLET UNDER TONGUE EVERY 3 TO 5 MINUTES FOR ANGINA 225 tablet 98  . OVER THE COUNTER MEDICATION Takes ALA(alpha lipoic)    . OVER THE COUNTER MEDICATION Takes lecithin 1200 mg 2 times daily.    Marland Kitchen OVER THE COUNTER MEDICATION Valerian 400 mg 3 tabs at night.    . polyethylene glycol (MIRALAX / GLYCOLAX) packet Take 17 g by mouth daily.    . tamsulosin  (FLOMAX) 0.4 MG CAPS capsule Take 1 capsule daily for Prostate 90 capsule 3  . Turmeric 500 MG CAPS Take 500 mg by mouth 4 (four) times daily.      No current facility-administered medications on file prior to visit.     PAST MEDICAL HISTORY: Past Medical History:  Diagnosis Date  . Anxiety    treated /w Xanax for mild depression , using 2 times per day, not PRN  . Asthma   . Benign prostatic hypertrophy   . CAD (coronary artery disease)    pt last left heart cath was in Nov 2008. EF was 55% on left ventriculogram. Circumflex was totally occluded after the 1st obtuse marginal with collaterals supplying the distal circumflex. LAD showed luminal irregularities. The stent in LAD was patent. The RCA showed luminal irregularities. The stent in th emid RCA and PDA were patent. There were no inverventions.   . Cancer ([place])    basal cell- face & head  . Chest pain   . Chronic obstructive pulmonary disease (HCC)    asthma  . Cluster headache    relative to sinus problems   . Cluster headache   . Constipation   . Depression   . Depression   . DM2 (diabetes mellitus, type 2) ([place])    well controlled  . GERD (gastroesophageal reflux disease)    hiatal hernia. Patient did hav ea Nissen fundoplication  . History of blood transfusion    need for Bld. transfusion relative to taking NSAIDS  . HTN (hypertension)    pt. followed by [facility], last cardiac visit 2012, one yr. ago  . Hyperlipidemia   . Joint pain   . Lactose intolerance   . Low back pain    chronic  . 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 Obesity   . OSA and COPD overlap syndrome ([place]) 02/25/2015   [employer] 5-20cmH2O autoCPAP  . Osteoarthritis   . Peptic ulcer disease   . Sleep concern    states the study (2003 )was done at [facility]., it failed & he was told to return & he never did. Pt. states he has been found by prev. hosp. staff that he has to be  told when to breathe & his wife does the same.   Marland Kitchen Spinal fracture    hx of traumatic in Jan 2009 after falling off the roof    PAST SURGICAL HISTORY: Past Surgical History:  Procedure Laterality Date  . BACK SURGERY  x3- last surgery lumbar- 1998- / fusion   . blepheroplasty     both eyes  . C5-T6 posterior fusion     with Oasis and radius screws  . CARDIAC CATHETERIZATION     2008 & 2012 multiple stents    . cataracts     cataracts removed- /w IOL - both eyes   . ESOPHAGEAL MANOMETRY    . EYE SURGERY    . HARDWARE REMOVAL  02/20/2012   Procedure: HARDWARE REMOVAL;  Surgeon: [name], MD;  Location: [facility];  Service: Neurosurgery;  Laterality: N/A;  Hardware Removal  . HIATAL HERNIA REPAIR    . LAPAROSCOPIC CHOLECYSTECTOMY     & IOC  . LUMBAR SPINE SURGERY     x4  . multiple percutaneous coronary interventions    . NASAL SINUS SURGERY     x2, sees Dr. [name], still having problems, states he uses Benadryl PRN- up to 5 times per day   . right carpal tunnel release    . right temporal artery biopsy    . UMBILICAL HERNIA REPAIR      SOCIAL HISTORY: Social History   Tobacco Use  . Smoking status: Former Smoker    Last attempt to quit: 02/14/1978    Years since quitting: 39.2  . Smokeless tobacco: Never Used  Substance Use Topics  . Alcohol use: Yes    Alcohol/week: 0.0 oz    Comment: rare  . Drug use: No    FAMILY HISTORY: Family History  Problem Relation Age of Onset  . Heart failure Mother        in her 12s  . Hypertension Mother   . Heart attack Mother   . Obesity Mother   . Coronary artery disease Father        developed in his 36s  . Hypertension Father   . Heart attack Father   . Heart attack Brother        in there 62s  . Heart attack Brother        in there 58s  . Gout Sister     ROS: Review of Systems  Constitutional: Negative for weight loss.  Respiratory: Negative for shortness of breath.   Cardiovascular: Negative for chest pain.     PHYSICAL EXAM: Blood pressure (!) 146/73, pulse 61, temperature 98.7 F (37.1 C), temperature source Oral, height 5\' 7"  (1.702 m), weight 187 lb (84.8 kg), SpO2 97 %. Body mass index is 29.29 kg/m. Physical Exam  Constitutional: He is oriented to person, place, and time. He appears well-developed and well-nourished.  Cardiovascular: Normal rate.  Pulmonary/Chest: Effort normal.  Musculoskeletal: Normal range of motion.  Neurological: He is oriented to person, place, and time.  Skin: Skin is warm and dry.  Psychiatric: He has a normal mood and affect. His behavior is normal.  Vitals reviewed.   RECENT LABS AND TESTS: BMET    Component Value Date/Time   NA 136 04/28/2017 0943   K 3.9 04/28/2017 0943   CL 98 04/28/2017 0943   CO2 31 04/28/2017 0943   GLUCOSE 92 04/28/2017 0943   BUN 17 04/28/2017 0943   CREATININE 1.15 04/28/2017 0943   CALCIUM 9.4 04/28/2017 0943   GFRNONAA 63 04/28/2017 0943   GFRAA 73 04/28/2017 0943   Lab Results  Component Value Date   HGBA1C 5.5 04/28/2017   HGBA1C 5.6 01/13/2017   HGBA1C 5.3 10/10/2016   HGBA1C 5.4 07/12/2016   HGBA1C 5.4 04/04/2016   Lab Results  Component Value Date   INSULIN 10.1 01/26/2017   CBC    Component Value Date/Time   WBC 4.8 04/28/2017 0943   RBC 5.56 04/28/2017 0943   HGB 17.2 (H) 04/28/2017 0943   HCT 49.7 04/28/2017 0943   PLT 166 04/28/2017 0943   MCV 89.4 04/28/2017 0943   MCH 30.9 04/28/2017 0943   MCHC 34.6 04/28/2017 0943   RDW 12.8 04/28/2017 0943   LYMPHSABS 1,781 04/28/2017 0943   MONOABS 621 01/13/2017 1038   EOSABS 91 04/28/2017 0943   BASOSABS 29 04/28/2017 0943   Iron/TIBC/Ferritin/ %Sat    Component Value Date/Time   IRON 48 05/16/2013 1610   TIBC 306 05/16/2013 1610   IRONPCTSAT 16 (L) 05/16/2013 1610   Lipid Panel     Component Value Date/Time   CHOL 193 04/28/2017 0943   TRIG 167 (H) 04/28/2017 0943   HDL 49 04/28/2017 0943   CHOLHDL 3.9 04/28/2017 0943   VLDL 31 (H)  01/13/2017 1038   LDLCALC 70 01/13/2017 1038   LDLDIRECT 116.0 06/03/2013 1513   Hepatic Function Panel     Component Value Date/Time   PROT 7.0 04/28/2017 0943   ALBUMIN 3.7 01/13/2017 1038   AST 22 04/28/2017 0943   ALT 16 04/28/2017 0943   ALKPHOS 47 01/13/2017 1038   BILITOT 0.7 04/28/2017 0943   BILIDIR 0.1 04/28/2017 0943   IBILI 0.6 04/28/2017 0943      Component Value Date/Time   TSH 1.94 04/28/2017 0943   TSH 0.96 01/13/2017 1038   TSH 0.78 10/10/2016 1206    ASSESSMENT AND PLAN: Essential hypertension  Class 1 obesity with serious comorbidity and body mass index (BMI) of 30.0 to 30.9 in adult, unspecified obesity type - Starting BMI greater then 30  PLAN:  Hypertension We discussed sodium restriction, working on healthy weight loss, and a regular exercise program as the means to achieve improved blood pressure control. Egidio agreed with this plan and agreed to follow up as directed. We will continue to monitor his blood pressure as well as his progress with the above lifestyle modifications. He will continue his medications as prescribed and will watch for signs of hypotension as he continues his lifestyle modifications. Kaulin agrees to follow up with our clinic in 3 weeks.  We spent > than 50% of the 15 minute visit on the counseling as documented in the note.  Obesity [name] is currently in the action stage of change. As such, his goal is to continue with weight loss efforts He has agreed to keep a food journal with 1200 calories and 85 grams of protein daily [name] has been instructed to work up to a goal of 150 minutes of combined cardio and strengthening exercise per week for weight loss and overall health benefits. We discussed the following Behavioral Modification Strategies today: increasing lean protein intake and work on meal planning and easy cooking plans   [name] has agreed to follow up with our clinic in 3 weeks. He was informed of the  importance of frequent follow up visits to maximize his success with intensive lifestyle modifications for his multiple health conditions.  [name], [name], am acting as transcriptionist for Darrell Duverney, PA-C  [name] have reviewed the above documentation for accuracy and completeness, and [name] agree with the above. -Darrell Duverney, PA-C  [name] have reviewed the above note and agree with the plan. -Dennard Nip, MD     Today's visit was # 8 out of 22.  Starting weight: 190 lbs  Starting date: 01/26/17 Today's weight : 187 lbs  Today's date: 05/24/2017 Total lbs lost to date: 3 (Patients must lose 7 lbs in the first 6 months to continue with counseling)   ASK: We discussed the diagnosis of obesity with [place] today and Qadir agreed to give [name] permission to discuss obesity behavioral modification therapy today.  ASSESS: Octavian has the diagnosis of obesity and his BMI today is 29.28 Avyaan is in the action stage of change   ADVISE: Zyeir was educated on the multiple health risks of obesity as well as the benefit of weight loss to improve his health. He was advised of the need for long term treatment and the importance of lifestyle modifications.  AGREE: Multiple dietary modification options and treatment options were discussed and  Kino agreed to keep a food journal with 1200 calories and 85 grams of protein daily We discussed the following Behavioral Modification Strategies today: increasing lean protein intake and work on meal planning and easy cooking plans

## 2017-06-14 ENCOUNTER — Ambulatory Visit (INDEPENDENT_AMBULATORY_CARE_PROVIDER_SITE_OTHER): Payer: Medicare Other | Admitting: Physician Assistant

## 2017-06-14 VITALS — BP 118/66 | HR 67 | Temp 98.3°F | Ht 67.0 in | Wt 186.0 lb

## 2017-06-14 DIAGNOSIS — Z6830 Body mass index (BMI) 30.0-30.9, adult: Secondary | ICD-10-CM | POA: Diagnosis not present

## 2017-06-14 DIAGNOSIS — E669 Obesity, unspecified: Secondary | ICD-10-CM

## 2017-06-14 DIAGNOSIS — I10 Essential (primary) hypertension: Secondary | ICD-10-CM

## 2017-06-14 NOTE — Progress Notes (Signed)
Office: [phone]  /  Fax: [phone]   HPI:   Chief Complaint: OBESITY [name] is here to discuss his progress with his obesity treatment plan. He is on the keep a food journal with 1200 calories and 85 grams of protein daily and is following his eating plan approximately 50 % of the time. He states he is exercising 0 minutes 0 times per week. [name] continues to do well with weight loss. He is mindful of his eating and he controls his portions well.  His weight is 186 lb (84.4 kg) today and has had a weight loss of 1 pound over a period of 3 weeks since his last visit. He has lost 4 lbs since starting treatment with [name].  Hypertension [name] is a 72 y.o. male with hypertension. Forest's blood pressure is stable and he denies chest pain or shortness of breath. He is working weight loss to help control his blood pressure with the goal of decreasing his risk of heart attack and stroke. [name]'s blood pressure is currently controlled.  ALLERGIES: Allergies  Allergen Reactions  . Codeine Itching and Other (See Comments)    Also hallucinations   . Morphine And Related Other (See Comments)    Hallucinations  . Nsaids Other (See Comments)    BLEEDING  . Crestor [Rosuvastatin]   . Cymbalta [Duloxetine Hcl]   . Dilaudid [Hydromorphone Hcl]   . Effexor [Venlafaxine]   . Gluten Meal   . Percocet [Oxycodone-Acetaminophen]   . Sulfa Antibiotics   . Topamax [Topiramate]   . Trazodone And Nefazodone   . Adhesive [Tape] Other (See Comments)    SKIN PEELS  . Penicillins Rash    MEDICATIONS: Current Outpatient Medications on File Prior to Visit  Medication Sig Dispense Refill  . acetaminophen (TYLENOL) 500 MG tablet Take 1 tablet (500 mg total) by mouth every 6 (six) hours as needed. 30 tablet 0  . Ascorbic Acid (VITAMIN C) 1000 MG tablet Take 1,000 mg by mouth daily.    Marland Kitchen aspirin EC 81 MG tablet Take 81 mg by mouth daily.    . B Complex Vitamins (B COMPLEX PO) Take by  mouth daily.    . bisoprolol-hydrochlorothiazide (ZIAC) 5-6.25 MG tablet Take 1 tablet by mouth daily. 90 tablet 1  . Choline Dihydrogen Citrate (CHOLINE CITRATE) 650 MG TABS Take by mouth.    . clobetasol (TEMOVATE) 0.05 % external solution Apply 1 application topically daily. 50 mL 0  . clopidogrel (PLAVIX) 75 MG tablet Take 1 tablet (75 mg total) by mouth daily. 90 tablet 1  . ezetimibe (ZETIA) 10 MG tablet Take 1 tablet (10 mg total) daily by mouth. 90 tablet 1  . fish oil-omega-3 fatty acids 1000 MG capsule Take 2 g by mouth daily.     . Flaxseed, Linseed, (FLAX SEED OIL) 1000 MG CAPS Take 1,000 mg by mouth 2 (two) times daily.     . fluocinonide (LIDEX) 0.05 % external solution Apply 1 application topically 2 (two) times daily.     . folic acid (FOLVITE) 678 MCG tablet Take 800 mcg by mouth daily.    Marland Kitchen ipratropium (ATROVENT) 0.03 % nasal spray instill 1 to 2 sprays into each nostril every 6 hours if needed  1  . Magnesium 500 MG TABS Take 500 mg by mouth 3 (three) times daily.     Marland Kitchen MELATONIN PO Take 30 mg by mouth daily.    . meloxicam (MOBIC) 15 MG tablet Take 1/2 to 1 tablet  daily with food for pain & inflammation - limit to 4 tablets /week to prevent Kidney Damage 90 tablet 0  . Multiple Vitamin (MULTIVITAMIN) capsule Take 1 capsule by mouth daily.      . niacin 500 MG tablet Take 500 mg by mouth 2 (two) times daily.    . nitroGLYCERIN (NITROSTAT) 0.4 MG SL tablet DISSOLVE 1 TABLET UNDER TONGUE EVERY 3 TO 5 MINUTES FOR ANGINA 225 tablet 98  . OVER THE COUNTER MEDICATION Takes ALA(alpha lipoic)    . OVER THE COUNTER MEDICATION Takes lecithin 1200 mg 2 times daily.    Marland Kitchen OVER THE COUNTER MEDICATION Valerian 400 mg 3 tabs at night.    . polyethylene glycol (MIRALAX / GLYCOLAX) packet Take 17 g by mouth daily.    . tamsulosin (FLOMAX) 0.4 MG CAPS capsule Take 1 capsule daily for Prostate 90 capsule 3  . Turmeric 500 MG CAPS Take 500 mg by mouth 4 (four) times daily.      No current  facility-administered medications on file prior to visit.     PAST MEDICAL HISTORY: Past Medical History:  Diagnosis Date  . Anxiety    treated /w Xanax for mild depression , using 2 times per day, not PRN  . Asthma   . Benign prostatic hypertrophy   . CAD (coronary artery disease)    pt last left heart cath was in Nov 2008. EF was 55% on left ventriculogram. Circumflex was totally occluded after the 1st obtuse marginal with collaterals supplying the distal circumflex. LAD showed luminal irregularities. The stent in LAD was patent. The RCA showed luminal irregularities. The stent in th emid RCA and PDA were patent. There were no inverventions.   . Cancer ([place])    basal cell- face & head  . Chest pain   . Chronic obstructive pulmonary disease (HCC)    asthma  . Cluster headache    relative to sinus problems   . Cluster headache   . Constipation   . Depression   . Depression   . DM2 (diabetes mellitus, type 2) ([place])    well controlled  . GERD (gastroesophageal reflux disease)    hiatal hernia. Patient did hav ea Nissen fundoplication  . History of blood transfusion    need for Bld. transfusion relative to taking NSAIDS  . HTN (hypertension)    pt. followed by [facility], last cardiac visit 2012, one yr. ago  . Hyperlipidemia   . Joint pain   . Lactose intolerance   . Low back pain    chronic  . 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 Obesity   . OSA and COPD overlap syndrome ([place]) 02/25/2015   [employer] 5-20cmH2O autoCPAP  . Osteoarthritis   . Peptic ulcer disease   . Sleep concern    states the study (2003 )was done at [facility] it failed & he was told to return & he never did. Pt. states he has been found by prev. hosp. staff that he has to be told when to breathe & his wife does the same.   Marland Kitchen Spinal fracture    hx of traumatic in Jan 2009 after falling off the roof    PAST SURGICAL HISTORY: Past  Surgical History:  Procedure Laterality Date  . BACK SURGERY     x3- last surgery lumbar- 1998- / fusion   . blepheroplasty     both eyes  .  C5-T6 posterior fusion     with [place]asis and radius screws  . CARDIAC CATHETERIZATION     2008 & 2012 multiple stents    . cataracts     cataracts removed- /w IOL - both eyes   . ESOPHAGEAL MANOMETRY    . EYE SURGERY    . HARDWARE REMOVAL  02/20/2012   Procedure: HARDWARE REMOVAL;  Surgeon: [name], MD;  Location: [facility];  Service: Neurosurgery;  Laterality: N/A;  Hardware Removal  . HIATAL HERNIA REPAIR    . LAPAROSCOPIC CHOLECYSTECTOMY     & IOC  . LUMBAR SPINE SURGERY     x4  . multiple percutaneous coronary interventions    . NASAL SINUS SURGERY     x2, sees Dr. [name], still having problems, states he uses Benadryl PRN- up to 5 times per day   . right carpal tunnel release    . right temporal artery biopsy    . UMBILICAL HERNIA REPAIR      SOCIAL HISTORY: Social History   Tobacco Use  . Smoking status: Former Smoker    Last attempt to quit: 02/14/1978    Years since quitting: 39.3  . Smokeless tobacco: Never Used  Substance Use Topics  . Alcohol use: Yes    Alcohol/week: 0.0 oz    Comment: rare  . Drug use: No    FAMILY HISTORY: Family History  Problem Relation Age of Onset  . Heart failure Mother        in her 53s  . Hypertension Mother   . Heart attack Mother   . Obesity Mother   . Coronary artery disease Father        developed in his 54s  . Hypertension Father   . Heart attack Father   . Heart attack Brother        in there 23s  . Heart attack Brother        in there 23s  . Gout Sister     ROS: Review of Systems  Constitutional: Positive for weight loss.  Respiratory: Negative for shortness of breath.   Cardiovascular: Negative for chest pain.    PHYSICAL EXAM: Blood pressure 118/66, pulse 67, temperature 98.3 F (36.8 C), temperature source Oral, height 5\' 7"  (1.702 m), weight 186 lb (84.4 kg),  SpO2 96 %. Body mass index is 29.13 kg/m. Physical Exam  Constitutional: He is oriented to person, place, and time. He appears well-developed.  Cardiovascular: Normal rate.  Pulmonary/Chest: Effort normal.  Musculoskeletal: Normal range of motion.  Neurological: He is oriented to person, place, and time.  Skin: Skin is warm and dry.  Psychiatric: He has a normal mood and affect. His behavior is normal.  Vitals reviewed.   RECENT LABS AND TESTS: BMET    Component Value Date/Time   NA 136 04/28/2017 0943   K 3.9 04/28/2017 0943   CL 98 04/28/2017 0943   CO2 31 04/28/2017 0943   GLUCOSE 92 04/28/2017 0943   BUN 17 04/28/2017 0943   CREATININE 1.15 04/28/2017 0943   CALCIUM 9.4 04/28/2017 0943   GFRNONAA 63 04/28/2017 0943   GFRAA 73 04/28/2017 0943   Lab Results  Component Value Date   HGBA1C 5.5 04/28/2017   HGBA1C 5.6 01/13/2017   HGBA1C 5.3 10/10/2016   HGBA1C 5.4 07/12/2016   HGBA1C 5.4 04/04/2016   Lab Results  Component Value Date   INSULIN 10.1 01/26/2017   CBC    Component Value Date/Time   WBC 4.8  04/28/2017 0943   RBC 5.56 04/28/2017 0943   HGB 17.2 (H) 04/28/2017 0943   HCT 49.7 04/28/2017 0943   PLT 166 04/28/2017 0943   MCV 89.4 04/28/2017 0943   MCH 30.9 04/28/2017 0943   MCHC 34.6 04/28/2017 0943   RDW 12.8 04/28/2017 0943   LYMPHSABS 1,781 04/28/2017 0943   MONOABS 621 01/13/2017 1038   EOSABS 91 04/28/2017 0943   BASOSABS 29 04/28/2017 0943   Iron/TIBC/Ferritin/ %Sat    Component Value Date/Time   IRON 48 05/16/2013 1610   TIBC 306 05/16/2013 1610   IRONPCTSAT 16 (L) 05/16/2013 1610   Lipid Panel     Component Value Date/Time   CHOL 193 04/28/2017 0943   TRIG 167 (H) 04/28/2017 0943   HDL 49 04/28/2017 0943   CHOLHDL 3.9 04/28/2017 0943   VLDL 31 (H) 01/13/2017 1038   LDLCALC 70 01/13/2017 1038   LDLDIRECT 116.0 06/03/2013 1513   Hepatic Function Panel     Component Value Date/Time   PROT 7.0 04/28/2017 0943   ALBUMIN 3.7  01/13/2017 1038   AST 22 04/28/2017 0943   ALT 16 04/28/2017 0943   ALKPHOS 47 01/13/2017 1038   BILITOT 0.7 04/28/2017 0943   BILIDIR 0.1 04/28/2017 0943   IBILI 0.6 04/28/2017 0943      Component Value Date/Time   TSH 1.94 04/28/2017 0943   TSH 0.96 01/13/2017 1038   TSH 0.78 10/10/2016 1206    ASSESSMENT AND PLAN: No diagnosis found.  PLAN:  Hypertension We discussed sodium restriction, working on healthy weight loss, and a regular exercise program as the means to achieve improved blood pressure control. Herman agreed with this plan and agreed to follow up as directed. We will continue to monitor his blood pressure as well as his progress with the above lifestyle modifications. He will continue his medications as prescribed and will watch for signs of hypotension as he continues his lifestyle modifications. Mohd agrees to follow up with our clinic in 2 weeks.  We spent > than 50% of the 15 minute visit on the counseling as documented in the note.  Obesity Kurk is currently in the action stage of change. As such, his goal is to continue with weight loss efforts He has agreed to keep a food journal with 1200 calories and 85 grams of protein daily Aldan has been instructed to work up to a goal of 150 minutes of combined cardio and strengthening exercise per week for weight loss and overall health benefits. We discussed the following Behavioral Modification Strategies today: increasing lean protein intake and work on meal planning and easy cooking plans   Kwame has agreed to follow up with our clinic in 2 weeks. He was informed of the importance of frequent follow up visits to maximize his success with intensive lifestyle modifications for his multiple health conditions.  I, Trixie Dredge, am acting as transcriptionist for Lacy Duverney, PA-C  I have reviewed the above documentation for accuracy and completeness, and I agree with the above. -Lacy Duverney, PA-C  I have  reviewed the above note and agree with the plan. -Dennard Nip, MD     Today's visit was # 9 out of 22.  Starting weight: 190 lbs Starting date: 01/26/17 Today's weight : 186 lbs  Today's date: 06/14/2017 Total lbs lost to date: 4 (Patients must lose 7 lbs in the first 6 months to continue with counseling)   ASK: We discussed the diagnosis of obesity with [place] today and  Stacey agreed to give [name] permission to discuss obesity behavioral modification therapy today.  ASSESS: Jailan has the diagnosis of obesity and his BMI today is 29.12 Zavian is in the action stage of change   ADVISE: Kolt was educated on the multiple health risks of obesity as well as the benefit of weight loss to improve his health. He was advised of the need for long term treatment and the importance of lifestyle modifications.  AGREE: Multiple dietary modification options and treatment options were discussed and  Osker agreed to keep a food journal with 1200 calories and 85 grams of protein daily We discussed the following Behavioral Modification Strategies today: increasing lean protein intake and work on meal planning and easy cooking plans

## 2017-06-27 ENCOUNTER — Ambulatory Visit (INDEPENDENT_AMBULATORY_CARE_PROVIDER_SITE_OTHER): Payer: Medicare Other | Admitting: Physician Assistant

## 2017-06-27 VITALS — BP 133/71 | HR 66 | Temp 98.0°F | Ht 67.0 in | Wt 188.0 lb

## 2017-06-27 DIAGNOSIS — Z6830 Body mass index (BMI) 30.0-30.9, adult: Secondary | ICD-10-CM | POA: Diagnosis not present

## 2017-06-27 DIAGNOSIS — I10 Essential (primary) hypertension: Secondary | ICD-10-CM | POA: Diagnosis not present

## 2017-06-27 DIAGNOSIS — E669 Obesity, unspecified: Secondary | ICD-10-CM | POA: Diagnosis not present

## 2017-06-27 DIAGNOSIS — H9190 Unspecified hearing loss, unspecified ear: Secondary | ICD-10-CM

## 2017-06-27 NOTE — Progress Notes (Addendum)
Office: [phone]  /  Fax: [phone]   HPI:   Chief Complaint: OBESITY [name] is here to discuss his progress with his obesity treatment plan. He is on the lower carbohydrate, vegetable and lean protein rich diet plan and is following his eating plan approximately 50 % of the time. He states he is exercising 0 minutes 0 times per week. [name] did not tolerate the low carbohydrate meal plan well. He states he has been eating out more. He is motivated to get back on track and continue with weight loss.  His weight is 188 lb (85.3 kg) today and has gained 2 pounds since his last visit. He has lost 2 lbs since starting treatment with [name].  Hearing Difficulty [name] states he has hearing difficulty, exacerbated by his tinnitus. He had a hearing test in the past, 2 years ago and was it was normal. Denies any ear pain, history of ear infections, or any injury to his ears.     Hypertension [name] is a 72 y.o. male with hypertension. [name]'s blood pressure is stable and he denies chest pain or shortness of breath. He is working weight loss to help control his blood pressure with the goal of decreasing his risk of heart attack and stroke. [name]'s blood pressure is currently controlled.  ALLERGIES: Allergies  Allergen Reactions  . Codeine Itching and Other (See Comments)    Also hallucinations   . Morphine And Related Other (See Comments)    Hallucinations  . Nsaids Other (See Comments)    BLEEDING  . Crestor [Rosuvastatin]   . Cymbalta [Duloxetine Hcl]   . Dilaudid [Hydromorphone Hcl]   . Effexor [Venlafaxine]   . Gluten Meal   . Percocet [Oxycodone-Acetaminophen]   . Sulfa Antibiotics   . Topamax [Topiramate]   . Trazodone And Nefazodone   . Adhesive [Tape] Other (See Comments)    SKIN PEELS  . Penicillins Rash    MEDICATIONS: Current Outpatient Medications on File Prior to Visit  Medication Sig Dispense Refill  . acetaminophen (TYLENOL) 500 MG tablet Take  1 tablet (500 mg total) by mouth every 6 (six) hours as needed. 30 tablet 0  . Ascorbic Acid (VITAMIN C) 1000 MG tablet Take 1,000 mg by mouth daily.    Marland Kitchen aspirin EC 81 MG tablet Take 81 mg by mouth daily.    . B Complex Vitamins (B COMPLEX PO) Take by mouth daily.    . bisoprolol-hydrochlorothiazide (ZIAC) 5-6.25 MG tablet Take 1 tablet by mouth daily. 90 tablet 1  . Choline Dihydrogen Citrate (CHOLINE CITRATE) 650 MG TABS Take by mouth.    . clobetasol (TEMOVATE) 0.05 % external solution Apply 1 application topically daily. 50 mL 0  . clopidogrel (PLAVIX) 75 MG tablet Take 1 tablet (75 mg total) by mouth daily. 90 tablet 1  . ezetimibe (ZETIA) 10 MG tablet Take 1 tablet (10 mg total) daily by mouth. 90 tablet 1  . fish oil-omega-3 fatty acids 1000 MG capsule Take 2 g by mouth daily.     . Flaxseed, Linseed, (FLAX SEED OIL) 1000 MG CAPS Take 1,000 mg by mouth 2 (two) times daily.     . fluocinonide (LIDEX) 0.05 % external solution Apply 1 application topically 2 (two) times daily.     . folic acid (FOLVITE) 211 MCG tablet Take 800 mcg by mouth daily.    Marland Kitchen ipratropium (ATROVENT) 0.03 % nasal spray instill 1 to 2 sprays into each nostril every 6 hours if needed  1  . Magnesium 500 MG TABS Take 500 mg by mouth 3 (three) times daily.     Marland Kitchen MELATONIN PO Take 30 mg by mouth daily.    . meloxicam (MOBIC) 15 MG tablet Take 1/2 to 1 tablet daily with food for pain & inflammation - limit to 4 tablets /week to prevent Kidney Damage 90 tablet 0  . Multiple Vitamin (MULTIVITAMIN) capsule Take 1 capsule by mouth daily.      . niacin 500 MG tablet Take 500 mg by mouth 2 (two) times daily.    . nitroGLYCERIN (NITROSTAT) 0.4 MG SL tablet DISSOLVE 1 TABLET UNDER TONGUE EVERY 3 TO 5 MINUTES FOR ANGINA 225 tablet 98  . OVER THE COUNTER MEDICATION Takes ALA(alpha lipoic)    . OVER THE COUNTER MEDICATION Takes lecithin 1200 mg 2 times daily.    Marland Kitchen OVER THE COUNTER MEDICATION Valerian 400 mg 3 tabs at night.    .  polyethylene glycol (MIRALAX / GLYCOLAX) packet Take 17 g by mouth daily.    . tamsulosin (FLOMAX) 0.4 MG CAPS capsule Take 1 capsule daily for Prostate 90 capsule 3  . Turmeric 500 MG CAPS Take 500 mg by mouth 4 (four) times daily.      No current facility-administered medications on file prior to visit.     PAST MEDICAL HISTORY: Past Medical History:  Diagnosis Date  . Anxiety    treated /w Xanax for mild depression , using 2 times per day, not PRN  . Asthma   . Benign prostatic hypertrophy   . CAD (coronary artery disease)    pt last left heart cath was in Nov 2008. EF was 55% on left ventriculogram. Circumflex was totally occluded after the 1st obtuse marginal with collaterals supplying the distal circumflex. LAD showed luminal irregularities. The stent in LAD was patent. The RCA showed luminal irregularities. The stent in th emid RCA and PDA were patent. There were no inverventions.   . Cancer ([place])    basal cell- face & head  . Chest pain   . Chronic obstructive pulmonary disease (HCC)    asthma  . Cluster headache    relative to sinus problems   . Cluster headache   . Constipation   . Depression   . Depression   . DM2 (diabetes mellitus, type 2) ([place])    well controlled  . GERD (gastroesophageal reflux disease)    hiatal hernia. Patient did hav ea Nissen fundoplication  . History of blood transfusion    need for Bld. transfusion relative to taking NSAIDS  . HTN (hypertension)    pt. followed by [facility], last cardiac visit 2012, one yr. ago  . Hyperlipidemia   . Joint pain   . Lactose intolerance   . Low back pain    chronic  . 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 Obesity   . OSA and COPD overlap syndrome ([place]) 02/25/2015   [employer] 5-20cmH2O autoCPAP  . Osteoarthritis   . Peptic ulcer disease   . Sleep concern    states the study (2003 )was done at [facility] it failed & he was told to  return & he never did. Pt. states he has been found by prev. hosp. staff that he has to be told when to breathe & his wife does the same.   Marland Kitchen Spinal fracture    hx of traumatic in Jan 2009 after falling  off the roof    PAST SURGICAL HISTORY: Past Surgical History:  Procedure Laterality Date  . BACK SURGERY     x3- last surgery lumbar- 1998- / fusion   . blepheroplasty     both eyes  . C5-T6 posterior fusion     with Oasis and radius screws  . CARDIAC CATHETERIZATION     2008 & 2012 multiple stents    . cataracts     cataracts removed- /w IOL - both eyes   . ESOPHAGEAL MANOMETRY    . EYE SURGERY    . HARDWARE REMOVAL  02/20/2012   Procedure: HARDWARE REMOVAL;  Surgeon: [name], MD;  Location: [facility];  Service: Neurosurgery;  Laterality: N/A;  Hardware Removal  . HIATAL HERNIA REPAIR    . LAPAROSCOPIC CHOLECYSTECTOMY     & IOC  . LUMBAR SPINE SURGERY     x4  . multiple percutaneous coronary interventions    . NASAL SINUS SURGERY     x2, sees Dr. [name], still having problems, states he uses Benadryl PRN- up to 5 times per day   . right carpal tunnel release    . right temporal artery biopsy    . UMBILICAL HERNIA REPAIR      SOCIAL HISTORY: Social History   Tobacco Use  . Smoking status: Former Smoker    Last attempt to quit: 02/14/1978    Years since quitting: 39.3  . Smokeless tobacco: Never Used  Substance Use Topics  . Alcohol use: Yes    Alcohol/week: 0.0 oz    Comment: rare  . Drug use: No    FAMILY HISTORY: Family History  Problem Relation Age of Onset  . Heart failure Mother        in her 61s  . Hypertension Mother   . Heart attack Mother   . Obesity Mother   . Coronary artery disease Father        developed in his 17s  . Hypertension Father   . Heart attack Father   . Heart attack Brother        in there 73s  . Heart attack Brother        in there 73s  . Gout Sister     ROS: Review of Systems  Constitutional: Negative for weight loss.    HENT: Positive for hearing loss and tinnitus. Negative for ear discharge and ear pain.        Negative injury to ear. Negative history of infections.  Respiratory: Negative for shortness of breath.   Cardiovascular: Negative for chest pain.    PHYSICAL EXAM: Blood pressure 133/71, pulse 66, temperature 98 F (36.7 C), temperature source Oral, height 5\' 7"  (1.702 m), weight 188 lb (85.3 kg), SpO2 100 %. Body mass index is 29.44 kg/m. Physical Exam  Constitutional: He is oriented to person, place, and time. He appears well-developed and well-nourished.  Cardiovascular: Normal rate.  Pulmonary/Chest: Effort normal.  Musculoskeletal: Normal range of motion.  Neurological: He is oriented to person, place, and time.  Skin: Skin is warm and dry.  Psychiatric: He has a normal mood and affect. His behavior is normal.  Vitals reviewed.   RECENT LABS AND TESTS: BMET    Component Value Date/Time   NA 136 04/28/2017 0943   K 3.9 04/28/2017 0943   CL 98 04/28/2017 0943   CO2 31 04/28/2017 0943   GLUCOSE 92 04/28/2017 0943   BUN 17 04/28/2017 0943   CREATININE 1.15 04/28/2017 0943  CALCIUM 9.4 04/28/2017 0943   GFRNONAA 63 04/28/2017 0943   GFRAA 73 04/28/2017 0943   Lab Results  Component Value Date   HGBA1C 5.5 04/28/2017   HGBA1C 5.6 01/13/2017   HGBA1C 5.3 10/10/2016   HGBA1C 5.4 07/12/2016   HGBA1C 5.4 04/04/2016   Lab Results  Component Value Date   INSULIN 10.1 01/26/2017   CBC    Component Value Date/Time   WBC 4.8 04/28/2017 0943   RBC 5.56 04/28/2017 0943   HGB 17.2 (H) 04/28/2017 0943   HCT 49.7 04/28/2017 0943   PLT 166 04/28/2017 0943   MCV 89.4 04/28/2017 0943   MCH 30.9 04/28/2017 0943   MCHC 34.6 04/28/2017 0943   RDW 12.8 04/28/2017 0943   LYMPHSABS 1,781 04/28/2017 0943   MONOABS 621 01/13/2017 1038   EOSABS 91 04/28/2017 0943   BASOSABS 29 04/28/2017 0943   Iron/TIBC/Ferritin/ %Sat    Component Value Date/Time   IRON 48 05/16/2013 1610    TIBC 306 05/16/2013 1610   IRONPCTSAT 16 (L) 05/16/2013 1610   Lipid Panel     Component Value Date/Time   CHOL 193 04/28/2017 0943   TRIG 167 (H) 04/28/2017 0943   HDL 49 04/28/2017 0943   CHOLHDL 3.9 04/28/2017 0943   VLDL 31 (H) 01/13/2017 1038   LDLCALC 70 01/13/2017 1038   LDLDIRECT 116.0 06/03/2013 1513   Hepatic Function Panel     Component Value Date/Time   PROT 7.0 04/28/2017 0943   ALBUMIN 3.7 01/13/2017 1038   AST 22 04/28/2017 0943   ALT 16 04/28/2017 0943   ALKPHOS 47 01/13/2017 1038   BILITOT 0.7 04/28/2017 0943   BILIDIR 0.1 04/28/2017 0943   IBILI 0.6 04/28/2017 0943      Component Value Date/Time   TSH 1.94 04/28/2017 0943   TSH 0.96 01/13/2017 1038   TSH 0.78 10/10/2016 1206    ASSESSMENT AND PLAN: Hearing difficulty, unspecified laterality - Plan: Ambulatory referral to ENT  Essential hypertension  Class 1 obesity with serious comorbidity and body mass index (BMI) of 30.0 to 30.9 in adult, unspecified obesity type - Starting BMI greater then 30  PLAN:  Hearing Difficulty Will refer to ENT, Dr. Kasandra Knudsen Raynelle Bring, MD for audiology hearing test evaluation and further management. [name] agrees to follow up with our clinic in 2 to 3 weeks.  Hypertension We discussed sodium restriction, working on healthy weight loss, and a regular exercise program as the means to achieve improved blood pressure control. [name] agreed with this plan and agreed to follow up as directed. We will continue to monitor his blood pressure as well as his progress with the above lifestyle modifications. He will continue his medications as prescribed and will watch for signs of hypotension as he continues his lifestyle modifications. [name] agrees to follow up with our clinic in 2 to 3 weeks.  Obesity [name] is currently in the action stage of change. As such, his goal is to continue with weight loss efforts He has agreed to change to keep a food journal with 1200 calories and 85  grams of protein daily [name] has been instructed to work up to a goal of 150 minutes of combined cardio and strengthening exercise per week for weight loss and overall health benefits. We discussed the following Behavioral Modification Strategies today: increasing lean protein intake and decrease eating out   [name] has agreed to follow up with our clinic in 2 to 3 weeks. He was informed of the importance of frequent  follow up visits to maximize his success with intensive lifestyle modifications for his multiple health conditions.    OBESITY BEHAVIORAL INTERVENTION VISIT  Today's visit was # 10 out of 22.  Starting weight: 190 lbs Starting date: 01/26/17 Today's weight : 188 lbs  Today's date: 06/27/2017 Total lbs lost to date: 2 (Patients must lose 7 lbs in the first 6 months to continue with counseling)   ASK: We discussed the diagnosis of obesity with [place] today and [place] agreed to give [name] permission to discuss obesity behavioral modification therapy today.  ASSESS: [name] has the diagnosis of obesity and his BMI today is 29.44 [name] is in the action stage of change   ADVISE: [name] was educated on the multiple health risks of obesity as well as the benefit of weight loss to improve his health. He was advised of the need for long term treatment and the importance of lifestyle modifications.  AGREE: Multiple dietary modification options and treatment options were discussed and  [name] agreed to the above obesity treatment plan.   Wilhemena Durie, am acting as transcriptionist for Lacy Duverney, [facility] have reviewed this note and agree with its contents  I have reviewed the above documentation for accuracy and completeness, and I agree with the above. -Dennard Nip, MD

## 2017-07-07 ENCOUNTER — Other Ambulatory Visit: Payer: Self-pay | Admitting: Physician Assistant

## 2017-07-18 ENCOUNTER — Ambulatory Visit (INDEPENDENT_AMBULATORY_CARE_PROVIDER_SITE_OTHER): Payer: Medicare Other | Admitting: Physician Assistant

## 2017-07-18 ENCOUNTER — Encounter (INDEPENDENT_AMBULATORY_CARE_PROVIDER_SITE_OTHER): Payer: Self-pay

## 2017-07-31 ENCOUNTER — Other Ambulatory Visit: Payer: Self-pay | Admitting: Physician Assistant

## 2017-08-08 DIAGNOSIS — F419 Anxiety disorder, unspecified: Secondary | ICD-10-CM | POA: Insufficient documentation

## 2017-08-08 NOTE — Progress Notes (Signed)
FOLLOW UP  Assessment and Plan:   Atherosclerosis of aorta Control blood pressure, cholesterol, glucose, increase exercise.   Hypertension Well controlled with current medications  Monitor blood pressure at home; patient to call if consistently greater than 130/80 Continue DASH diet.   Reminder to go to the ER if any CP, SOB, nausea, dizziness, severe HA, changes vision/speech, left arm numbness and tingling and jaw pain.  Cholesterol Currently at goal; continue medications Continue low cholesterol diet and exercise.  Check lipid panel.   Overweight Long discussion about weight loss, diet, and exercise Recommended diet heavy in fruits and veggies and low in animal meats, cheeses, and dairy products, appropriate calorie intake Discussed ideal weight for height - goal is 185 lb Patient will work on improving diet again, will discuss exercise with ortho - discussed tracking macros and caloric intake Will follow up in 3 months  Vitamin D Def At goal at last visit; continue supplementation to maintain goal of 70-100 Defer Vit D level  Anxiety/insomnia Well managed by current regimen; continue medications - discussed avoid taking valium every day, to limit to 5 days a week.  Stress management techniques discussed, increase water, good sleep hygiene discussed, increase exercise, and increase veggies.   Acute nasopharyngitis - Discussed the importance of avoiding unnecessary antibiotic therapy. Suggested symptomatic OTC remedies. Nasal saline spray for congestion. Nasal steroids, allergy pill, oral steroids Follow up as needed. -     promethazine-dextromethorphan (PROMETHAZINE-DM) 6.25-15 MG/5ML syrup; Take 5 mLs by mouth 4 (four) times daily as needed for cough. -     predniSONE (DELTASONE) 20 MG tablet; 2 tablets daily for 3 days, 1 tablet daily for 4 days.  Other orders Fill only if progressing symptoms, develop a fever:  -     azithromycin (ZITHROMAX) 250 MG tablet; Take 2  tablets (500 mg) on  Day 1,  followed by 1 tablet (250 mg) once daily on Days 2 through 5.   Continue diet and meds as discussed. Further disposition pending results of labs. Discussed med's effects and SE's.   Over 30 minutes of exam, counseling, chart review, and critical decision making was performed.   Future Appointments  Date Time Provider [place]  11/16/2017  9:30 AM [name], MD [facility] None  05/24/2018  9:00 AM [name], MD [facility] None    ----------------------------------------------------------------------------------------------------------------------  HPI 72 y.o. male  presents for 3 month follow up on hypertension, cholesterol, glucose management, weight and vitamin D deficiency. He also reports URI symptoms that had been coming on for a week or so that became suddenly worse last night. Reports productive cough, runny nose, chest congestion - denies chest pain, shortness of breath, dizziness, headaches. He reports some malaise. He denies any fever/chills.   he has a diagnosis of anxiety and is currently on valium - he currently takes 2.5 mg nightly to assist with sleep with melatonin, reports symptoms are well controlled on current regimen.   BMI is Body mass index is 30.54 kg/m., he has not been working on diet and exercise. Wt Readings from Last 3 Encounters:  08/09/17 195 lb (88.5 kg)  06/27/17 188 lb (85.3 kg)  06/14/17 186 lb (84.4 kg)   His blood pressure has been controlled at home, today their BP is BP: 124/76  He does not workout. He denies chest pain, shortness of breath, dizziness.   He is on cholesterol medication (zetia 10 mg daily) and denies myalgias. His cholesterol is at goal. The cholesterol last visit was:  Lab Results  Component Value Date   CHOL 193 04/28/2017   HDL 49 04/28/2017   LDLCALC 70 01/13/2017   LDLDIRECT 116.0 06/03/2013   TRIG 167 (H) 04/28/2017   CHOLHDL 3.9 04/28/2017    He has not been working on  diet and exercise for glucose management, and denies increased appetite, nausea, paresthesia of the feet, polydipsia, polyuria, visual disturbances, vomiting and weight loss. Last A1C in the office was:  Lab Results  Component Value Date   HGBA1C 5.5 04/28/2017   Patient is on Vitamin D supplement and above goal at recent check:  Lab Results  Component Value Date   VD25OH 102 (H) 04/28/2017        Current Medications:  Current Outpatient Medications on File Prior to Visit  Medication Sig  . acetaminophen (TYLENOL) 500 MG tablet Take 1 tablet (500 mg total) by mouth every 6 (six) hours as needed.  . Ascorbic Acid (VITAMIN C) 1000 MG tablet Take 1,000 mg by mouth daily.  Marland Kitchen aspirin EC 81 MG tablet Take 81 mg by mouth daily.  . B Complex Vitamins (B COMPLEX PO) Take by mouth daily.  . bisoprolol-hydrochlorothiazide (ZIAC) 5-6.25 MG tablet Take 1 tablet by mouth daily.  . Choline Dihydrogen Citrate (CHOLINE CITRATE) 650 MG TABS Take by mouth.  . clobetasol (TEMOVATE) 0.05 % external solution Apply 1 application topically daily.  . clopidogrel (PLAVIX) 75 MG tablet TAKE 1 TABLET BY MOUTH ONCE DAILY  . diazepam (VALIUM) 5 MG tablet TAKE 1 TABLET BY MOUTH EVERY 8 HOURS AS NEEDED FOR ANXIETY  . ezetimibe (ZETIA) 10 MG tablet Take 1 tablet (10 mg total) daily by mouth.  . fish oil-omega-3 fatty acids 1000 MG capsule Take 2 g by mouth daily.   . Flaxseed, Linseed, (FLAX SEED OIL) 1000 MG CAPS Take 1,000 mg by mouth 2 (two) times daily.   . fluocinonide (LIDEX) 0.05 % external solution Apply 1 application topically 2 (two) times daily.   . folic acid (FOLVITE) 400 MCG tablet Take 800 mcg by mouth daily.  Marland Kitchen ipratropium (ATROVENT) 0.03 % nasal spray instill 1 to 2 sprays into each nostril every 6 hours if needed  . Magnesium 500 MG TABS Take 500 mg by mouth 4 (four) times daily.   Marland Kitchen MELATONIN PO Take 30 mg by mouth daily.  . meloxicam (MOBIC) 15 MG tablet Take 1/2 to 1 tablet daily with food for  pain & inflammation - limit to 4 tablets /week to prevent Kidney Damage  . Multiple Vitamin (MULTIVITAMIN) capsule Take 1 capsule by mouth daily.    . niacin 500 MG tablet Take 500 mg by mouth 2 (two) times daily.  . nitroGLYCERIN (NITROSTAT) 0.4 MG SL tablet DISSOLVE 1 TABLET UNDER TONGUE EVERY 3 TO 5 MINUTES FOR ANGINA  . OVER THE COUNTER MEDICATION Takes ALA(alpha lipoic)  . OVER THE COUNTER MEDICATION Takes lecithin 1200 mg 2 times daily.  Marland Kitchen OVER THE COUNTER MEDICATION Valerian 400 mg 3 tabs at night.  . polyethylene glycol (MIRALAX / GLYCOLAX) packet Take 17 g by mouth daily.  . tamsulosin (FLOMAX) 0.4 MG CAPS capsule Take 1 capsule daily for Prostate  . Turmeric 500 MG CAPS Take 500 mg by mouth 4 (four) times daily.    No current facility-administered medications on file prior to visit.      Allergies:  Allergies  Allergen Reactions  . Codeine Itching and Other (See Comments)    Also hallucinations   . Morphine And Related Other (See  Comments)    Hallucinations  . Nsaids Other (See Comments)    BLEEDING  . Crestor [Rosuvastatin]   . Cymbalta [Duloxetine Hcl]   . Dilaudid [Hydromorphone Hcl]   . Effexor [Venlafaxine]   . Gluten Meal   . Percocet [Oxycodone-Acetaminophen]   . Sulfa Antibiotics   . Topamax [Topiramate]   . Trazodone And Nefazodone   . Adhesive [Tape] Other (See Comments)    SKIN PEELS  . Penicillins Rash     Medical History:  Past Medical History:  Diagnosis Date  . Anxiety    treated /w Xanax for mild depression , using 2 times per day, not PRN  . Asthma   . Benign prostatic hypertrophy   . CAD (coronary artery disease)    pt last left heart cath was in Nov 2008. EF was 55% on left ventriculogram. Circumflex was totally occluded after the 1st obtuse marginal with collaterals supplying the distal circumflex. LAD showed luminal irregularities. The stent in LAD was patent. The RCA showed luminal irregularities. The stent in th emid RCA and PDA were  patent. There were no inverventions.   . Cancer ([place])    basal cell- face & head  . Chest pain   . Chronic obstructive pulmonary disease (HCC)    asthma  . Cluster headache    relative to sinus problems   . Cluster headache   . Constipation   . Depression   . Depression   . DM2 (diabetes mellitus, type 2) ([place])    well controlled  . GERD (gastroesophageal reflux disease)    hiatal hernia. Patient did hav ea Nissen fundoplication  . History of blood transfusion    need for Bld. transfusion relative to taking NSAIDS  . HTN (hypertension)    pt. followed by [facility], last cardiac visit 2012, one yr. ago  . Hyperlipidemia   . Joint pain   . Lactose intolerance   . Low back pain    chronic  . 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 Obesity   . 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 Osteoarthritis   . Peptic ulcer disease   . Sleep concern    states the study (2003 )was done at [facility]., it failed & he was told to return & he never did. Pt. states he has been found by prev. hosp. staff that he has to be told when to breathe & his wife does the same.   Marland Kitchen Spinal fracture    hx of traumatic in Jan 2009 after falling off the roof   Family history- Reviewed and unchanged Social history- Reviewed and unchanged   Review of Systems:  Review of Systems  Constitutional: Positive for malaise/fatigue. Negative for chills, fever and weight loss.  HENT: Positive for congestion and sore throat. Negative for ear discharge, ear pain, hearing loss, sinus pain and tinnitus.   Eyes: Negative for blurred vision and double vision.  Respiratory: Positive for cough and sputum production. Negative for shortness of breath and wheezing.   Cardiovascular: Negative for chest pain, palpitations, orthopnea, claudication and leg swelling.  Gastrointestinal: Negative for abdominal pain, blood  in stool, constipation, diarrhea, heartburn, melena, nausea and vomiting.  Genitourinary: Negative.   Musculoskeletal: Positive for back pain. Negative for joint pain and myalgias.  Skin: Negative for rash.  Neurological: Negative for dizziness, tingling, sensory change, weakness and headaches.  Endo/Heme/Allergies: Positive for environmental allergies. Negative for polydipsia.  Psychiatric/Behavioral: Negative.  Negative for depression. The patient is not nervous/anxious and does not have insomnia.   All other systems reviewed and are negative.   Physical Exam: BP 124/76   Pulse 65   Temp 97.7 F (36.5 C)   Ht 5\' 7"  (1.702 m)   Wt 195 lb (88.5 kg)   SpO2 95%   BMI 30.54 kg/m  Wt Readings from Last 3 Encounters:  08/09/17 195 lb (88.5 kg)  06/27/17 188 lb (85.3 kg)  06/14/17 186 lb (84.4 kg)   General Appearance: Well nourished, in no apparent distress. Eyes: PERRLA, EOMs, conjunctiva no swelling or erythema Sinuses: No Frontal/maxillary tenderness ENT/Mouth: Ext aud canals clear, TMs without erythema, bulging. No erythema, swelling, or exudate on post pharynx.  Tonsils not swollen or erythematous. Hearing normal.  Neck: Supple, thyroid normal.  Respiratory: Respiratory effort normal, BS present throughout with rales to left base without rhonchi, wheezing or stridor.  Cardio: RRR with no MRGs. Brisk peripheral pulses without edema.  Abdomen: Soft, + BS.  Non tender, no guarding, rebound, hernias, masses. Lymphatics: Non tender without lymphadenopathy.  Musculoskeletal: Full ROM, 5/5 strength, Normal gait Skin: Warm, dry without rashes, lesions, ecchymosis.  Neuro: Cranial nerves intact. No cerebellar symptoms.  Psych: Awake and oriented X 3, normal affect, Insight and Judgment appropriate.    Izora Ribas, NP 9:52 AM [facility]

## 2017-08-09 ENCOUNTER — Ambulatory Visit (INDEPENDENT_AMBULATORY_CARE_PROVIDER_SITE_OTHER): Payer: Medicare Other | Admitting: Adult Health

## 2017-08-09 ENCOUNTER — Encounter: Payer: Self-pay | Admitting: Adult Health

## 2017-08-09 VITALS — BP 124/76 | HR 65 | Temp 97.7°F | Ht 67.0 in | Wt 195.0 lb

## 2017-08-09 DIAGNOSIS — Z6829 Body mass index (BMI) 29.0-29.9, adult: Secondary | ICD-10-CM | POA: Diagnosis not present

## 2017-08-09 DIAGNOSIS — E559 Vitamin D deficiency, unspecified: Secondary | ICD-10-CM

## 2017-08-09 DIAGNOSIS — I700 Atherosclerosis of aorta: Secondary | ICD-10-CM | POA: Diagnosis not present

## 2017-08-09 DIAGNOSIS — I10 Essential (primary) hypertension: Secondary | ICD-10-CM | POA: Diagnosis not present

## 2017-08-09 DIAGNOSIS — E782 Mixed hyperlipidemia: Secondary | ICD-10-CM | POA: Diagnosis not present

## 2017-08-09 DIAGNOSIS — J00 Acute nasopharyngitis [common cold]: Secondary | ICD-10-CM | POA: Diagnosis not present

## 2017-08-09 DIAGNOSIS — F419 Anxiety disorder, unspecified: Secondary | ICD-10-CM | POA: Diagnosis not present

## 2017-08-09 DIAGNOSIS — Z79899 Other long term (current) drug therapy: Secondary | ICD-10-CM | POA: Diagnosis not present

## 2017-08-09 LAB — LIPID PANEL
CHOLESTEROL: 167 mg/dL (ref <200)
HDL: 39 mg/dL — ABNORMAL LOW (ref >40)
LDL Cholesterol (Calc): 101 mg/dL (calc) — ABNORMAL HIGH
Non-HDL Cholesterol (Calc): 128 mg/dL (calc) (ref <130)
Total CHOL/HDL Ratio: 4.3 (calc) (ref <5.0)
Triglycerides: 162 mg/dL — ABNORMAL HIGH (ref <150)

## 2017-08-09 LAB — HEPATIC FUNCTION PANEL
AG Ratio: 1.3 (calc) (ref 1.0–2.5)
ALBUMIN MSPROF: 4 g/dL (ref 3.6–5.1)
ALT: 16 U/L (ref 9–46)
AST: 21 U/L (ref 10–35)
Alkaline phosphatase (APISO): 47 U/L (ref 40–115)
BILIRUBIN DIRECT: 0.1 mg/dL (ref 0.0–0.2)
BILIRUBIN INDIRECT: 0.6 mg/dL (ref 0.2–1.2)
GLOBULIN: 3.1 g/dL (ref 1.9–3.7)
Total Bilirubin: 0.7 mg/dL (ref 0.2–1.2)
Total Protein: 7.1 g/dL (ref 6.1–8.1)

## 2017-08-09 LAB — CBC WITH DIFFERENTIAL/PLATELET
BASOS ABS: 56 {cells}/uL (ref 0–200)
Basophils Relative: 0.5 %
Eosinophils Absolute: 155 cells/uL (ref 15–500)
Eosinophils Relative: 1.4 %
HEMATOCRIT: 47.8 % (ref 38.5–50.0)
Hemoglobin: 16.7 g/dL (ref 13.2–17.1)
LYMPHS ABS: 1110 {cells}/uL (ref 850–3900)
MCH: 31 pg (ref 27.0–33.0)
MCHC: 34.9 g/dL (ref 32.0–36.0)
MCV: 88.8 fL (ref 80.0–100.0)
MPV: 10.6 fL (ref 7.5–12.5)
Monocytes Relative: 7.7 %
NEUTROS PCT: 80.4 %
Neutro Abs: 8924 cells/uL — ABNORMAL HIGH (ref 1500–7800)
Platelets: 141 10*3/uL (ref 140–400)
RBC: 5.38 10*6/uL (ref 4.20–5.80)
RDW: 13 % (ref 11.0–15.0)
Total Lymphocyte: 10 %
WBC mixed population: 855 cells/uL (ref 200–950)
WBC: 11.1 10*3/uL — ABNORMAL HIGH (ref 3.8–10.8)

## 2017-08-09 LAB — BASIC METABOLIC PANEL WITH GFR
BUN / CREAT RATIO: 20 (calc) (ref 6–22)
BUN: 25 mg/dL (ref 7–25)
CALCIUM: 9.5 mg/dL (ref 8.6–10.3)
CO2: 29 mmol/L (ref 20–32)
Chloride: 99 mmol/L (ref 98–110)
Creat: 1.26 mg/dL — ABNORMAL HIGH (ref 0.70–1.18)
GFR, EST AFRICAN AMERICAN: 65 mL/min/{1.73_m2} (ref >=60)
GFR, EST NON AFRICAN AMERICAN: 56 mL/min/{1.73_m2} — AB (ref >=60)
Glucose, Bld: 84 mg/dL (ref 65–99)
POTASSIUM: 4.2 mmol/L (ref 3.5–5.3)
Sodium: 137 mmol/L (ref 135–146)

## 2017-08-09 LAB — TSH: TSH: 1.1 mIU/L (ref 0.40–4.50)

## 2017-08-09 MED ORDER — PREDNISONE 20 MG PO TABS
ORAL_TABLET | ORAL | 0 refills | Status: DC
Start: 2017-08-09 — End: 2017-09-20

## 2017-08-09 MED ORDER — AZITHROMYCIN 250 MG PO TABS: ORAL_TABLET | ORAL | 1 refills | Status: DC

## 2017-08-09 MED ORDER — PROMETHAZINE-DM 6.25-15 MG/5ML PO SYRP: 5.0000 mL | ORAL_SOLUTION | Freq: Four times a day (QID) | ORAL | 1 refills | Status: DC | PRN

## 2017-08-09 NOTE — Patient Instructions (Signed)
Look up an application on your phone called "Lose it!" - this will let you log your food and track macros (fat, protein, carbs) as well as calories         When it comes to diets, agreement about the perfect plan isn't easy to find, even among the experts. Experts at the [employer] developed an idea known as the Healthy Eating Plate. Just imagine a plate divided into logical, healthy portions.  The emphasis is on diet quality:  Load up on vegetables and fruits - one-half of your plate: Aim for color and variety, and remember that potatoes don't count.  Go for whole grains - one-quarter of your plate: Whole wheat, barley, wheat berries, quinoa, oats, brown rice, and foods made with them. If you want pasta, go with whole wheat pasta.  Protein power - one-quarter of your plate: Fish, chicken, beans, and nuts are all healthy, versatile protein sources. Limit red meat.  The diet, however, does go beyond the plate, offering a few other suggestions.  Use healthy plant oils, such as olive, canola, soy, corn, sunflower and peanut. Check the labels, and avoid partially hydrogenated oil, which have unhealthy trans fats.  If you're thirsty, drink water. Coffee and tea are good in moderation, but skip sugary drinks and limit milk and dairy products to one or two daily servings.  The type of carbohydrate in the diet is more important than the amount. Some sources of carbohydrates, such as vegetables, fruits, whole grains, and beans-are healthier than others.  Finally, stay active.  Intermittent fasting is more about strategy than starvation. It's meant to reset your body in different ways, hopefully with fitness and nutrition changes as a result.  Like any big switchover, though, results may vary when it comes down to the individual level. What works for your friends may not work for you, or vice versa. That's why it's helpful to play around with variations on intermittent  fasting and healthy habits and find what works best for you.  WHAT IS INTERMITTENT FASTING AND WHY DO IT?  Intermittent fasting doesn't involve specific foods, but rather, a strict schedule regarding when you eat. Also called "time-restricted eating," the tactic has been praised for its contribution to weight loss, improved body composition, and decreased cravings. Preliminary research also suggests it may be beneficial for glucose tolerance, hormone regulation, better muscle mass and lower body fat.  Part of its appeal is the simplicity of the effort. Unlike some other trends, there's no calculations to intermittent fasting.  You simply eat within a certain block of time, usually a window of 8-10 hours. In the other big block of time - about 14-16 hours, including when you're asleep - you don't eat anything, not even snacks. You can drink water, coffee, tea or any other beverage that doesn't have calories.  For example, if you like having a late dinner, you might skip breakfast and have your first meal at noon and your last meal of the day at 8 p.m., and then not eat until noon again the next day.  IDEAS FOR GETTING STARTED  If you're new to the strategy, it may be helpful to eat within the typical circadian rhythm and keep eating within daylight hours. This can be especially beneficial if you're looking at intermittent fasting for weight-loss goals.  So first try only eating between 12pm to 8pm.  Outside of this time you may have water, black coffee, and hot tea. You may not eat  it drink anything that has carbs, sugars, OR artificial sugars like diet soda.   Like any major eating and fitness shift, it can take time to find the perfect fit, so don't be afraid to experiment with different options - including ditching intermittent fasting altogether if it's simply not for you. But if it is, you may be surprised by some of the benefits that come along with the strategy.

## 2017-08-21 ENCOUNTER — Other Ambulatory Visit: Payer: Self-pay | Admitting: Cardiology

## 2017-09-11 ENCOUNTER — Other Ambulatory Visit: Payer: Self-pay | Admitting: Physician Assistant

## 2017-09-19 NOTE — Progress Notes (Signed)
Cardiology Office Note    Date:  09/20/2017   ID:  Darrell, Mccullough 1945-09-25, MRN [id number]  PCP:  [name], MD  Cardiologist: Dr. Merian Capron establish with Dr. Meda Coffee Chief Complaint  Patient presents with  . Follow-up    History of Present Illness:  [name] is a 72 y.o. male a history of CAD, HTN, hyperlipidemia presents for followup.  He has had multiple stents in his LAD and RCA.  Last cath in 7/12 showed a totally occluded CFX (known from prior caths) with collaterals from the RCA.  There was no other significant coronary disease and EF was 55%.  He is unable to tolerate statins.    I last saw the patient 08/2016 for preop clearance. BP was up & I increased Ziac to 5-12.5 mg daily.   Says he has rare chest pain if he becomes stress mild ache-eases quickly. No exertional symptoms. Not exercising. Lack of energy and increase weight.Says he stopped Ziac 1-2 yrs ago b/c of joint pain. Hospitalized with lipitor.Increase stress pass 2 weeks. BP running high. Not sure why his ziac is back down to 5/6.25.    Past Medical History:  Diagnosis Date  . Anxiety    treated /w Xanax for mild depression , using 2 times per day, not PRN  . Asthma   . Benign prostatic hypertrophy   . CAD (coronary artery disease)    pt last left heart cath was in Nov 2008. EF was 55% on left ventriculogram. Circumflex was totally occluded after the 1st obtuse marginal with collaterals supplying the distal circumflex. LAD showed luminal irregularities. The stent in LAD was patent. The RCA showed luminal irregularities. The stent in th emid RCA and PDA were patent. There were no inverventions.   . Cancer ([place])    basal cell- face & head  . Chest pain   . Chronic obstructive pulmonary disease (HCC)    asthma  . Cluster headache    relative to sinus problems   . Cluster headache   . Constipation   . Depression   . Depression   . DM2 (diabetes mellitus, type 2) ([place])    well  controlled  . GERD (gastroesophageal reflux disease)    hiatal hernia. Patient did hav ea Nissen fundoplication  . History of blood transfusion    need for Bld. transfusion relative to taking NSAIDS  . HTN (hypertension)    pt. followed by [facility], last cardiac visit 2012, one yr. ago  . Hyperlipidemia   . Joint pain   . Lactose intolerance   . Low back pain    chronic  . Neuromuscular disorder ([place])    nerve involvement in back & upper back relative to hardware in neck   . Obesity   . OSA and COPD overlap syndrome ([place]) 02/25/2015   [employer] 5-20cmH2O autoCPAP  . Osteoarthritis   . Peptic ulcer disease   . Sleep concern    states the study (2003 )was done at [facility] it failed & he was told to return & he never did. Pt. states he has been found by prev. hosp. staff that he has to be told when to breathe & his wife does the same.   Marland Kitchen Spinal fracture    hx of traumatic in Jan 2009 after falling off the roof    Past Surgical History:  Procedure Laterality Date  . BACK SURGERY     x3- last surgery lumbar- 1998- / fusion   .  blepheroplasty     both eyes  . C5-T6 posterior fusion     with Oasis and radius screws  . CARDIAC CATHETERIZATION     2008 & 2012 multiple stents    . cataracts     cataracts removed- /w IOL - both eyes   . ESOPHAGEAL MANOMETRY    . EYE SURGERY    . HARDWARE REMOVAL  02/20/2012   Procedure: HARDWARE REMOVAL;  Surgeon: [name], MD;  Location: [facility];  Service: Neurosurgery;  Laterality: N/A;  Hardware Removal  . HIATAL HERNIA REPAIR    . LAPAROSCOPIC CHOLECYSTECTOMY     & IOC  . LUMBAR SPINE SURGERY     x4  . multiple percutaneous coronary interventions    . NASAL SINUS SURGERY     x2, sees Dr. [name], still having problems, states he uses Benadryl PRN- up to 5 times per day   . right carpal tunnel release    . right temporal artery biopsy    . UMBILICAL HERNIA REPAIR      Current Medications: Current Meds  Medication Sig  .  acetaminophen (TYLENOL) 500 MG tablet Take 1 tablet (500 mg total) by mouth every 6 (six) hours as needed.  . Ascorbic Acid (VITAMIN C) 1000 MG tablet Take 1,000 mg by mouth daily.  Marland Kitchen aspirin EC 81 MG tablet Take 81 mg by mouth daily.  . B Complex Vitamins (B COMPLEX PO) Take 1 capsule by mouth daily.   . bisoprolol-hydrochlorothiazide (ZIAC) 5-6.25 MG tablet Take 1 tablet by mouth daily. Please keep upcoming appt before anymore refills. Thank you  . Choline Dihydrogen Citrate (CHOLINE CITRATE) 650 MG TABS Take 1 tablet by mouth daily.   . clopidogrel (PLAVIX) 75 MG tablet TAKE 1 TABLET BY MOUTH ONCE DAILY  . fish oil-omega-3 fatty acids 1000 MG capsule Take 2 g by mouth daily.   . Flaxseed, Linseed, (FLAX SEED OIL) 1000 MG CAPS Take 1,000 mg by mouth 2 (two) times daily.   . folic acid (FOLVITE) 960 MCG tablet Take 800 mcg by mouth daily.  Marland Kitchen ipratropium (ATROVENT) 0.03 % nasal spray Place 1-2 sprays into both nostrils every 6 (six) hours as needed for rhinitis.  . Magnesium 500 MG TABS Take 500 mg by mouth 4 (four) times daily.   Marland Kitchen MELATONIN PO Take 30 mg by mouth daily.  . meloxicam (MOBIC) 15 MG tablet Take 1/2 to 1 tablet daily with food for pain & inflammation - limit to 4 tablets /week to prevent Kidney Damage  . Multiple Vitamin (MULTIVITAMIN) capsule Take 1 capsule by mouth daily.    . niacin 500 MG tablet Take 500 mg by mouth 2 (two) times daily.  . nitroGLYCERIN (NITROSTAT) 0.4 MG SL tablet Place 0.4 mg under the tongue every 5 (five) minutes x 3 doses as needed for chest pain.  Marland Kitchen OVER THE COUNTER MEDICATION ALA(alpha lipoic) - Take one (1) capsule by mouth twice daily.  Marland Kitchen OVER THE COUNTER MEDICATION Lecithin - Take 1200 mg by mouth twice daily.  Marland Kitchen OVER THE COUNTER MEDICATION Valerian 400 mg - Take three (3) tablets by mouth at night.  . promethazine-dextromethorphan (PROMETHAZINE-DM) 6.25-15 MG/5ML syrup Take 5 mLs by mouth 4 (four) times daily as needed for cough.  . tamsulosin  (FLOMAX) 0.4 MG CAPS capsule Take 1 capsule daily for Prostate  . Turmeric 500 MG CAPS Take 500 mg by mouth 4 (four) times daily.      Allergies:   Codeine; Morphine  and related; Nsaids; Crestor [rosuvastatin]; Cymbalta [duloxetine hcl]; Dilaudid [hydromorphone hcl]; Effexor [venlafaxine]; Gluten meal; Percocet [oxycodone-acetaminophen]; Sulfa antibiotics; Topamax [topiramate]; Trazodone and nefazodone; Adhesive [tape]; and Penicillins   Social History   Socioeconomic History  . Marital status: Married    Spouse name: Not on file  . Number of children: Not on file  . Years of education: Not on file  . Highest education level: Not on file  Occupational History  . Occupation: Retired  Scientific laboratory technician  . Financial resource strain: Not on file  . Food insecurity:    Worry: Not on file    Inability: Not on file  . Transportation needs:    Medical: Not on file    Non-medical: Not on file  Tobacco Use  . Smoking status: Former Smoker    Last attempt to quit: 02/14/1978    Years since quitting: 39.6  . Smokeless tobacco: Never Used  Substance and Sexual Activity  . Alcohol use: Yes    Alcohol/week: 0.0 oz    Comment: rare  . Drug use: No  . Sexual activity: Not on file  Lifestyle  . Physical activity:    Days per week: Not on file    Minutes per session: Not on file  . Stress: Not on file  Relationships  . Social connections:    Talks on phone: Not on file    Gets together: Not on file    Attends religious service: Not on file    Active member of club or organization: Not on file    Attends meetings of clubs or organizations: Not on file    Relationship status: Not on file  Other Topics Concern  . Not on file  Social History Narrative  . Not on file     Family History:  The patient's family history includes Coronary artery disease in his father; Gout in his sister; Heart attack in his brother, brother, father, and mother; Heart failure in his mother; Hypertension in his  father and mother; Obesity in his mother.   ROS:   Please see the history of present illness.    Review of Systems  Constitution: Positive for malaise/fatigue and weight gain.  HENT: Negative.   Cardiovascular: Negative.   Respiratory: Negative.   Endocrine: Negative.   Hematologic/Lymphatic: Negative.   Musculoskeletal: Negative.   Gastrointestinal: Negative.   Genitourinary: Negative.   Neurological: Negative.    All other systems reviewed and are negative.   PHYSICAL EXAM:   VS:  BP (!) 154/86   Pulse 62   Ht 5\' 7"  (1.702 m)   Wt 200 lb 3.2 oz (90.8 kg)   BMI 31.36 kg/m   Physical Exam  GEN: Obese, in no acute distress  Neck: no JVD, carotid bruits, or masses Cardiac:RRR; positive S4 no murmurs, rubs, Respiratory:  clear to auscultation bilaterally, normal work of breathing GI: soft, nontender, nondistended, + BS Ext: without cyanosis, clubbing, or edema, Good distal pulses bilaterally Neuro:  Alert and Oriented x 3 Psych: euthymic mood, full affect  Wt Readings from Last 3 Encounters:  09/20/17 200 lb 3.2 oz (90.8 kg)  08/09/17 195 lb (88.5 kg)  06/27/17 188 lb (85.3 kg)      Studies/Labs Reviewed:   EKG:  EKG is ordered today.  The ekg reviewed from 04/2017 NSR without acute change. Recent Labs: 04/28/2017: Magnesium 1.8 08/09/2017: ALT 16; BUN 25; Creat 1.26; Hemoglobin 16.7; Platelets 141; Potassium 4.2; Sodium 137; TSH 1.10   Lipid Panel  Component Value Date/Time   CHOL 167 08/09/2017 1004   TRIG 162 (H) 08/09/2017 1004   HDL 39 (L) 08/09/2017 1004   CHOLHDL 4.3 08/09/2017 1004   VLDL 31 (H) 01/13/2017 1038   LDLCALC 101 (H) 08/09/2017 1004   LDLDIRECT 116.0 06/03/2013 1513    Additional studies/ records that were reviewed today include:    FINDINGS: 1. Hemodynamics:  LV 118/18, aorta 127/70. 2. Left ventriculography:  EF was estimated to be 55%.  There were no     regional wall motion abnormalities in the RAO projection. 3. Right coronary  artery:  The right coronary was a dominant vessel.     There was a proximal-to-mid right coronary artery stent.  There was     about 25% in-stent restenosis.  There was about 30% distal RCA     stenosis.  The PLV and PDA were both moderate-sized vessels.  There     was about 40-50% ostial PLV stenosis.  The stent in the PDA was     patent.  There were luminal irregularities diffusely in the PDA.     The PLV did provide right-to-left collaterals to the distal     circumflex and also supplied 2 small-to-moderate obtuse marginals. 4. Left main:  The left main had no angiographic coronary disease. 5. Left circumflex system:  The left circumflex was totally occluded     after the first obtuse marginal.  This was known from prior heart     catheterizations.  The first obtuse marginal was a small-to-     moderate vessel with luminal irregularities.  The distal circumflex     was reconstituted via collaterals in the PLV.  There were 2 small-     to-moderate obtuse marginals filled via the collaterals. 6. LAD system:  There was a proximal LAD stent with 30-40% in-stent     restenosis.  The remainder of the LAD had luminal irregularities.     There was a small first diagonal.  There was a moderate-sized     second diagonal that had about 40% ostial stenosis.   IMPRESSION:  The patient has a known occluded circumflex which was seen again today.  There were good collaterals from the right coronary artery system.  The patient's other vessels have no significant disease.  His stents were patent.  We will plan on medical treatment for now.  He will try Prilosec for possible reflux symptoms.     Loralie Champagne, MD DM/MEDQ  D:  01/07/2011  T:  01/07/2011  Job:  [id number]        ASSESSMENT:    1. Atherosclerosis of native coronary artery of native heart without angina pectoris   2. Essential hypertension   3. Hyperlipidemia, mixed   4. CKD stage 2 due to type 2 diabetes mellitus (HCC)   5. Obesity  (BMI 30.0-34.9)      PLAN:  In order of problems listed above:  Cad s/p multiple stents in LAD & RCA. Last cath 12/2010 100 Cfx with collaterals from RCA, EF 55%.  Rare nonexertional chest pain.  Continue to monitor.  Increase exercise, weight loss, better blood pressure control and referred to lipid clinic for possible Repatha.  Follow-up with Dr. Meda Coffee in 6 months to become established.  Hypertension BP up and Crt 1.26. Isn't on higher dose of Ziac. Will increase to 10/6.25 mg daily. He will check his BP's at home.  Hyperlipidemia LDL 101, 08/09/17 failed statins. Will refer to lipid clinic for repatha.  CKD stage III creatinine 1.26 08/09/17 told to limit meloxicam by PCP  Obesity: weight up-was seen at [facility] weight loss clinic      Medication Adjustments/Labs and Tests Ordered: Current medicines are reviewed at length with the patient today.  Concerns regarding medicines are outlined above.  Medication changes, Labs and Tests ordered today are listed in the Patient Instructions below. Patient Instructions  Medication Instructions:  Your physician recommends that you continue on your current medications as directed. Please refer to the Current Medication list given to you today.  Labwork: NONE  Testing/Procedures: NONE  Follow-Up: Your physician wants you to follow-up in: 6 months with Dr. Johnsie Cancel. You will receive a reminder letter in the mail two months in advance. If you don't receive a letter, please call our office to schedule the follow-up appointment.   If you need a refill on your cardiac medications before your next appointment, please call your pharmacy.       Sumner Boast, PA-C  09/20/2017 11:00 AM    [facility] [place], [place], [place]  [address] Phone: [phone]; Fax: [phone]

## 2017-09-20 ENCOUNTER — Ambulatory Visit (INDEPENDENT_AMBULATORY_CARE_PROVIDER_SITE_OTHER): Payer: Medicare Other | Admitting: Physician Assistant

## 2017-09-20 ENCOUNTER — Encounter: Payer: Self-pay | Admitting: Physician Assistant

## 2017-09-20 VITALS — BP 154/86 | HR 62 | Ht 67.0 in | Wt 200.2 lb

## 2017-09-20 DIAGNOSIS — I2510 Atherosclerotic heart disease of native coronary artery without angina pectoris: Secondary | ICD-10-CM

## 2017-09-20 DIAGNOSIS — N182 Chronic kidney disease, stage 2 (mild): Secondary | ICD-10-CM

## 2017-09-20 DIAGNOSIS — E782 Mixed hyperlipidemia: Secondary | ICD-10-CM | POA: Diagnosis not present

## 2017-09-20 DIAGNOSIS — I10 Essential (primary) hypertension: Secondary | ICD-10-CM

## 2017-09-20 DIAGNOSIS — E669 Obesity, unspecified: Secondary | ICD-10-CM | POA: Diagnosis not present

## 2017-09-20 DIAGNOSIS — E1122 Type 2 diabetes mellitus with diabetic chronic kidney disease: Secondary | ICD-10-CM | POA: Diagnosis not present

## 2017-09-20 MED ORDER — BISOPROLOL-HYDROCHLOROTHIAZIDE 10-6.25 MG PO TABS: 1.0000 | ORAL_TABLET | Freq: Every day | ORAL | 3 refills | Status: DC

## 2017-09-20 NOTE — Patient Instructions (Addendum)
Medication Instructions:  Your physician has recommended you make the following change in your medication:  1- Increase Ziac 10/6.25 mg by mouth daily.  Labwork: NONE  Testing/Procedures: NONE  Follow-Up: Your physician wants you to follow-up in: 6 months with Dr. Meda Mccullough to be established.  Your physician recommends that you schedule a follow-up appointment in: lipid clinic to discuss [place].    Your physician would like you to check your blood pressures at home and give our office a call if they continue to be high.  Your physician would like you to return to the weight lose clinic.   If you need a refill on your cardiac medications before your next appointment, please call your pharmacy.    Exercising to Lose Weight Exercising can help you to lose weight. In order to lose weight through exercise, you need to do vigorous-intensity exercise. You can tell that you are exercising with vigorous intensity if you are breathing very hard and fast and cannot hold a conversation while exercising. Moderate-intensity exercise helps to maintain your current weight. You can tell that you are exercising at a moderate level if you have a higher heart rate and faster breathing, but you are still able to hold a conversation. How often should I exercise? Choose an activity that you enjoy and set realistic goals. Your health care provider can help you to make an activity plan that works for you. Exercise regularly as directed by your health care provider. This may include:  Doing resistance training twice each week, such as: ? Push-ups. ? Sit-ups. ? Lifting weights. ? Using resistance bands.  Doing a given intensity of exercise for a given amount of time. Choose from these options: ? 150 minutes of moderate-intensity exercise every week. ? 75 minutes of vigorous-intensity exercise every week. ? A mix of moderate-intensity and vigorous-intensity exercise every week.  Children, pregnant women,  people who are out of shape, people who are overweight, and older adults may need to consult a health care provider for individual recommendations. If you have any sort of medical condition, be sure to consult your health care provider before starting a new exercise program. What are some activities that can help me to lose weight?  Walking at a rate of at least 4.5 miles an hour.  Jogging or running at a rate of 5 miles per hour.  Biking at a rate of at least 10 miles per hour.  Lap swimming.  Roller-skating or in-line skating.  Cross-country skiing.  Vigorous competitive sports, such as football, basketball, and soccer.  Jumping rope.  Aerobic dancing. How can I be more active in my day-to-day activities?  Use the stairs instead of the elevator.  Take a walk during your lunch break.  If you drive, park your car farther away from work or school.  If you take public transportation, get off one stop early and walk the rest of the way.  Make all of your phone calls while standing up and walking around.  Get up, stretch, and walk around every 30 minutes throughout the day. What guidelines should I follow while exercising?  Do not exercise so much that you hurt yourself, feel dizzy, or get very short of breath.  Consult your health care provider prior to starting a new exercise program.  Wear comfortable clothes and shoes with good support.  Drink plenty of water while you exercise to prevent dehydration or heat stroke. Body water is lost during exercise and must be replaced.  Work out  until you breathe faster and your heart beats faster. This information is not intended to replace advice given to you by your health care provider. Make sure you discuss any questions you have with your health care provider. Document Released: 07/02/2010 Document Revised: 11/05/2015 Document Reviewed: 10/31/2013 Elsevier Interactive Patient Education  [employer].

## 2017-10-09 ENCOUNTER — Ambulatory Visit: Payer: Self-pay | Admitting: Adult Health

## 2017-10-09 DIAGNOSIS — R310 Gross hematuria: Secondary | ICD-10-CM | POA: Diagnosis not present

## 2017-10-09 NOTE — Progress Notes (Deleted)
Assessment and Plan:  There are no diagnoses linked to this encounter.    Further disposition pending results of labs. Discussed med's effects and SE's.   Over 30 minutes of exam, counseling, chart review, and critical decision making was performed.   Future Appointments  Date Time Provider [place]  10/09/2017  4:15 PM Liane Comber, NP [facility] None  10/10/2017  9:30 AM Vicie Mutters, PA-C [facility] None  10/25/2017  9:30 AM [facility] PHARMACIST [facility]  11/16/2017  9:30 AM [name], MD [facility] None  03/09/2018 10:20 AM Dorothy Spark, MD [facility]  05/24/2018  9:00 AM [name], MD [facility] None    ------------------------------------------------------------------------------------------------------------------   HPI There were no vitals taken for this visit.  72 y.o.male presents for c/o blood clots in stool and burning  He is on plavix and ASA  Last colonoscopy 04/11/2016 by Dr. Oletta Lamas at [facility], which noted diverticulosis without polyps. He does have family hx of colonic neoplasia and has been advised to return for follow up in 2022.   Past Medical History:  Diagnosis Date  . Anxiety    treated /w Xanax for mild depression , using 2 times per day, not PRN  . Asthma   . Benign prostatic hypertrophy   . CAD (coronary artery disease)    pt last left heart cath was in Nov 2008. EF was 55% on left ventriculogram. Circumflex was totally occluded after the 1st obtuse marginal with collaterals supplying the distal circumflex. LAD showed luminal irregularities. The stent in LAD was patent. The RCA showed luminal irregularities. The stent in th emid RCA and PDA were patent. There were no inverventions.   . Cancer ([place])    basal cell- face & head  . Chest pain   . Chronic obstructive pulmonary disease (HCC)    asthma  . Cluster headache    relative to sinus problems   . Cluster headache   . Constipation    . Depression   . Depression   . DM2 (diabetes mellitus, type 2) ([place])    well controlled  . GERD (gastroesophageal reflux disease)    hiatal hernia. Patient did hav ea Nissen fundoplication  . History of blood transfusion    need for Bld. transfusion relative to taking NSAIDS  . HTN (hypertension)    pt. followed by [facility], last cardiac visit 2012, one yr. ago  . Hyperlipidemia   . Joint pain   . Lactose intolerance   . Low back pain    chronic  . Neuromuscular disorder ([place])    nerve involvement in back & upper back relative to hardware in neck   . Obesity   . OSA and COPD overlap syndrome ([place]) 02/25/2015   [employer] 5-20cmH2O autoCPAP  . Osteoarthritis   . Peptic ulcer disease   . Sleep concern    states the study (2003 )was done at [facility]., it failed & he was told to return & he never did. Pt. states he has been found by prev. hosp. staff that he has to be told when to breathe & his wife does the same.   Marland Kitchen Spinal fracture    hx of traumatic in Jan 2009 after falling off the roof     Allergies  Allergen Reactions  . Codeine Itching and Other (See Comments)    Also hallucinations   . Morphine And Related Other (See Comments)    Hallucinations  . Nsaids Other (See Comments)    BLEEDING  .  Crestor [Rosuvastatin]   . Cymbalta [Duloxetine Hcl]   . Dilaudid [Hydromorphone Hcl]   . Effexor [Venlafaxine]   . Gluten Meal   . Percocet [Oxycodone-Acetaminophen]   . Sulfa Antibiotics   . Topamax [Topiramate]   . Trazodone And Nefazodone   . Adhesive [Tape] Other (See Comments)    SKIN PEELS  . Penicillins Rash    Current Outpatient Medications on File Prior to Visit  Medication Sig  . acetaminophen (TYLENOL) 500 MG tablet Take 1 tablet (500 mg total) by mouth every 6 (six) hours as needed.  . Ascorbic Acid (VITAMIN C) 1000 MG tablet Take 1,000 mg by mouth daily.  Marland Kitchen aspirin EC 81 MG tablet Take 81 mg by mouth daily.  . B Complex Vitamins (B COMPLEX PO) Take  1 capsule by mouth daily.   . bisoprolol-hydrochlorothiazide (ZIAC) 10-6.25 MG tablet Take 1 tablet by mouth daily.  . Choline Dihydrogen Citrate (CHOLINE CITRATE) 650 MG TABS Take 1 tablet by mouth daily.   . clopidogrel (PLAVIX) 75 MG tablet TAKE 1 TABLET BY MOUTH ONCE DAILY  . fish oil-omega-3 fatty acids 1000 MG capsule Take 2 g by mouth daily.   . Flaxseed, Linseed, (FLAX SEED OIL) 1000 MG CAPS Take 1,000 mg by mouth 2 (two) times daily.   . folic acid (FOLVITE) 093 MCG tablet Take 800 mcg by mouth daily.  Marland Kitchen ipratropium (ATROVENT) 0.03 % nasal spray Place 1-2 sprays into both nostrils every 6 (six) hours as needed for rhinitis.  . Magnesium 500 MG TABS Take 500 mg by mouth 4 (four) times daily.   Marland Kitchen MELATONIN PO Take 30 mg by mouth daily.  . meloxicam (MOBIC) 15 MG tablet Take 1/2 to 1 tablet daily with food for pain & inflammation - limit to 4 tablets /week to prevent Kidney Damage  . Multiple Vitamin (MULTIVITAMIN) capsule Take 1 capsule by mouth daily.    . niacin 500 MG tablet Take 500 mg by mouth 2 (two) times daily.  . nitroGLYCERIN (NITROSTAT) 0.4 MG SL tablet Place 0.4 mg under the tongue every 5 (five) minutes x 3 doses as needed for chest pain.  Marland Kitchen OVER THE COUNTER MEDICATION ALA(alpha lipoic) - Take one (1) capsule by mouth twice daily.  Marland Kitchen OVER THE COUNTER MEDICATION Lecithin - Take 1200 mg by mouth twice daily.  Marland Kitchen OVER THE COUNTER MEDICATION Valerian 400 mg - Take three (3) tablets by mouth at night.  . promethazine-dextromethorphan (PROMETHAZINE-DM) 6.25-15 MG/5ML syrup Take 5 mLs by mouth 4 (four) times daily as needed for cough.  . tamsulosin (FLOMAX) 0.4 MG CAPS capsule Take 1 capsule daily for Prostate  . Turmeric 500 MG CAPS Take 500 mg by mouth 4 (four) times daily.    No current facility-administered medications on file prior to visit.     ROS: all negative except above.   Physical Exam:  There were no vitals taken for this visit.  General Appearance: Well  nourished, in no apparent distress. Eyes: PERRLA, EOMs, conjunctiva no swelling or erythema Sinuses: No Frontal/maxillary tenderness ENT/Mouth: Ext aud canals clear, TMs without erythema, bulging. No erythema, swelling, or exudate on post pharynx.  Tonsils not swollen or erythematous. Hearing normal.  Neck: Supple, thyroid normal.  Respiratory: Respiratory effort normal, BS equal bilaterally without rales, rhonchi, wheezing or stridor.  Cardio: RRR with no MRGs. Brisk peripheral pulses without edema.  Abdomen: Soft, + BS.  Non tender, no guarding, rebound, hernias, masses. Lymphatics: Non tender without lymphadenopathy.  Musculoskeletal: Full ROM, 5/5  strength, normal gait.  Skin: Warm, dry without rashes, lesions, ecchymosis.  Neuro: Cranial nerves intact. Normal muscle tone, no cerebellar symptoms. Sensation intact.  Psych: Awake and oriented X 3, normal affect, Insight and Judgment appropriate.     Izora Ribas, NP 11:24 AM [facility]

## 2017-10-10 ENCOUNTER — Ambulatory Visit: Payer: Self-pay | Admitting: Physician Assistant

## 2017-10-11 DIAGNOSIS — R310 Gross hematuria: Secondary | ICD-10-CM | POA: Diagnosis not present

## 2017-10-11 DIAGNOSIS — N200 Calculus of kidney: Secondary | ICD-10-CM | POA: Diagnosis not present

## 2017-10-12 ENCOUNTER — Ambulatory Visit: Payer: Medicare Other

## 2017-10-12 DIAGNOSIS — N200 Calculus of kidney: Secondary | ICD-10-CM | POA: Diagnosis not present

## 2017-10-12 DIAGNOSIS — R338 Other retention of urine: Secondary | ICD-10-CM | POA: Diagnosis not present

## 2017-10-12 DIAGNOSIS — D49512 Neoplasm of unspecified behavior of left kidney: Secondary | ICD-10-CM | POA: Diagnosis not present

## 2017-10-12 DIAGNOSIS — N281 Cyst of kidney, acquired: Secondary | ICD-10-CM | POA: Diagnosis not present

## 2017-10-12 DIAGNOSIS — R310 Gross hematuria: Secondary | ICD-10-CM | POA: Diagnosis not present

## 2017-10-12 DIAGNOSIS — R972 Elevated prostate specific antigen [PSA]: Secondary | ICD-10-CM | POA: Diagnosis not present

## 2017-10-16 ENCOUNTER — Other Ambulatory Visit (HOSPITAL_COMMUNITY): Payer: Self-pay | Admitting: Urology

## 2017-10-16 DIAGNOSIS — D49512 Neoplasm of unspecified behavior of left kidney: Secondary | ICD-10-CM

## 2017-10-18 ENCOUNTER — Ambulatory Visit (HOSPITAL_COMMUNITY)
Admission: RE | Admit: 2017-10-18 | Discharge: 2017-10-18 | Disposition: A | Discharge status: Home / Self Care | Payer: Medicare Other | Source: Ambulatory Visit | Attending: Urology | Admitting: Urology

## 2017-10-18 DIAGNOSIS — D49512 Neoplasm of unspecified behavior of left kidney: Secondary | ICD-10-CM | POA: Insufficient documentation

## 2017-10-18 DIAGNOSIS — R310 Gross hematuria: Secondary | ICD-10-CM | POA: Diagnosis not present

## 2017-10-18 MED ORDER — GADOBENATE DIMEGLUMINE 529 MG/ML IV SOLN
20.0000 mL | Freq: Once | INTRAVENOUS | Status: AC | PRN
  Administered 2017-10-18: 19 mL via INTRAVENOUS

## 2017-10-19 LAB — POCT I-STAT CREATININE: Creatinine, Ser: 1.1 mg/dL (ref 0.61–1.24)

## 2017-10-20 ENCOUNTER — Telehealth: Payer: Self-pay | Admitting: Cardiology

## 2017-10-20 NOTE — Telephone Encounter (Signed)
New Message:         [facility] Pre-operative Risk Assessment    Request for surgical clearance:  1. What type of surgery is being performed? Trans urethral resection of bladder tumor  2. When is this surgery scheduled? TBD  3. What type of clearance is required (medical clearance vs. Pharmacy clearance to hold med vs. Both)? Pharmacy  4. Are there any medications that need to be held prior to surgery and how long?clopidogrel (PLAVIX) 75 MG tablet two days prior to surgery  5. Practice name and name of physician performing surgery? Dr. Elta Guadeloupe Ottelin/ [facility]  6. What is your office phone number [phone]    7.   What is your office fax number [phone]  8.   Anesthesia type (None, local, MAC, general) ? General   [name] 10/20/2017, 2:04 PM  _________________________________________________________________   (provider comments below)

## 2017-10-24 NOTE — Telephone Encounter (Signed)
He can stop Plavix 5 days prior to procedure and restart 1 week afterwards

## 2017-10-25 ENCOUNTER — Ambulatory Visit: Payer: Medicare Other

## 2017-10-25 NOTE — Telephone Encounter (Signed)
   Primary Cardiologist: Dr Meda Coffee  Chart reviewed as part of pre-operative protocol coverage. Given past medical history and time since last visit, based on ACC/AHA guidelines, [name] would be at acceptable risk for the planned procedure without further cardiovascular testing.   OK to hold Plavix 5 days pre op and resume 5 days post op.  I will route this recommendation to the requesting party via Epic fax function and remove from pre-op pool.  Please call with questions.  Kerin Ransom, PA-C 10/25/2017, 8:11 AM

## 2017-10-27 ENCOUNTER — Other Ambulatory Visit: Payer: Self-pay | Admitting: Urology

## 2017-10-30 ENCOUNTER — Encounter (HOSPITAL_COMMUNITY): Payer: Self-pay

## 2017-10-30 NOTE — Pre-Procedure Instructions (Signed)
The following are in epic: Surgical clearance 10/20/2017 telephone note EKG 04/28/2017

## 2017-10-30 NOTE — Patient Instructions (Signed)
Your procedure is scheduled on: Friday, Nov 03, 2017   Surgery Time:  10:45AM-11:45AM   Report to [facility] Main  Entrance    Report to admitting at 8:45 AM   Call this number if you have problems the morning of surgery [phone]   Do not eat food or drink liquids :After Midnight.   Do NOT smoke after Midnight   Take these medicines the morning of surgery with A SIP OF WATER: Finasteride   Bring CPAP mask and tubing                               You may not have any metal on your body including jewelry, and body piercings             Do not wear lotions, powders, perfumes/cologne, or deodorant                          Men may shave face and neck.   Do not bring valuables to the hospital. [place].   Contacts, dentures or bridgework may not be worn into surgery.    Patients discharged the day of surgery will not be allowed to drive home.   Special Instructions: Bring a copy of your healthcare power of attorney and living will documents         the day of surgery if you haven't scanned them in before.              Please read over the following fact sheets you were given:  [facility] - Preparing for Surgery Before surgery, you can play an important role.  Because skin is not sterile, your skin needs to be as free of germs as possible.  You can reduce the number of germs on your skin by washing with CHG (chlorahexidine gluconate) soap before surgery.  CHG is an antiseptic cleaner which kills germs and bonds with the skin to continue killing germs even after washing. Please DO NOT use if you have an allergy to CHG or antibacterial soaps.  If your skin becomes reddened/irritated stop using the CHG and inform your nurse when you arrive at Short Stay. Do not shave (including legs and underarms) for at least 48 hours prior to the first CHG shower.  You may shave your face/neck.  Please follow these instructions  carefully:  1.  Shower with CHG Soap the night before surgery and the  morning of surgery.  2.  If you choose to wash your hair, wash your hair first as usual with your normal  shampoo.  3.  After you shampoo, rinse your hair and body thoroughly to remove the shampoo.                             4.  Use CHG as you would any other liquid soap.  You can apply chg directly to the skin and wash.  Gently with a scrungie or clean washcloth.  5.  Apply the CHG Soap to your body ONLY FROM THE NECK DOWN.   Do   not use on face/ open  Wound or open sores. Avoid contact with eyes, ears mouth and   genitals (private parts).                       Wash face,  Genitals (private parts) with your normal soap.             6.  Wash thoroughly, paying special attention to the area where your    surgery  will be performed.  7.  Thoroughly rinse your body with warm water from the neck down.  8.  DO NOT shower/wash with your normal soap after using and rinsing off the CHG Soap.                9.  Pat yourself dry with a clean towel.            10.  Wear clean pajamas.            11.  Place clean sheets on your bed the night of your first shower and do not  sleep with pets. Day of Surgery : Do not apply any lotions/deodorants the morning of surgery.  Please wear clean clothes to the hospital/surgery center.  FAILURE TO FOLLOW THESE INSTRUCTIONS MAY RESULT IN THE CANCELLATION OF YOUR SURGERY  PATIENT SIGNATURE_________________________________  NURSE SIGNATURE__________________________________  ________________________________________________________________________

## 2017-10-31 ENCOUNTER — Encounter (HOSPITAL_COMMUNITY)
Admission: RE | Admit: 2017-10-31 | Discharge: 2017-10-31 | Disposition: A | Discharge status: Home / Self Care | Payer: Medicare Other | Source: Ambulatory Visit | Attending: Urology | Admitting: Urology

## 2017-10-31 ENCOUNTER — Other Ambulatory Visit: Payer: Self-pay

## 2017-10-31 ENCOUNTER — Encounter (HOSPITAL_COMMUNITY): Payer: Self-pay

## 2017-10-31 DIAGNOSIS — I10 Essential (primary) hypertension: Secondary | ICD-10-CM | POA: Diagnosis not present

## 2017-10-31 DIAGNOSIS — E1151 Type 2 diabetes mellitus with diabetic peripheral angiopathy without gangrene: Secondary | ICD-10-CM | POA: Diagnosis not present

## 2017-10-31 DIAGNOSIS — Z79899 Other long term (current) drug therapy: Secondary | ICD-10-CM | POA: Diagnosis not present

## 2017-10-31 DIAGNOSIS — I2510 Atherosclerotic heart disease of native coronary artery without angina pectoris: Secondary | ICD-10-CM | POA: Diagnosis not present

## 2017-10-31 DIAGNOSIS — L814 Other melanin hyperpigmentation: Secondary | ICD-10-CM | POA: Diagnosis not present

## 2017-10-31 DIAGNOSIS — Z87891 Personal history of nicotine dependence: Secondary | ICD-10-CM | POA: Diagnosis not present

## 2017-10-31 DIAGNOSIS — D485 Neoplasm of uncertain behavior of skin: Secondary | ICD-10-CM | POA: Diagnosis not present

## 2017-10-31 DIAGNOSIS — N401 Enlarged prostate with lower urinary tract symptoms: Secondary | ICD-10-CM | POA: Diagnosis not present

## 2017-10-31 DIAGNOSIS — Z85828 Personal history of other malignant neoplasm of skin: Secondary | ICD-10-CM | POA: Diagnosis not present

## 2017-10-31 DIAGNOSIS — G709 Myoneural disorder, unspecified: Secondary | ICD-10-CM | POA: Diagnosis not present

## 2017-10-31 DIAGNOSIS — Z7982 Long term (current) use of aspirin: Secondary | ICD-10-CM | POA: Diagnosis not present

## 2017-10-31 DIAGNOSIS — N329 Bladder disorder, unspecified: Secondary | ICD-10-CM | POA: Diagnosis not present

## 2017-10-31 DIAGNOSIS — L570 Actinic keratosis: Secondary | ICD-10-CM | POA: Diagnosis not present

## 2017-10-31 DIAGNOSIS — R338 Other retention of urine: Secondary | ICD-10-CM | POA: Diagnosis not present

## 2017-10-31 DIAGNOSIS — D225 Melanocytic nevi of trunk: Secondary | ICD-10-CM | POA: Diagnosis not present

## 2017-10-31 DIAGNOSIS — Z791 Long term (current) use of non-steroidal anti-inflammatories (NSAID): Secondary | ICD-10-CM | POA: Diagnosis not present

## 2017-10-31 DIAGNOSIS — Z955 Presence of coronary angioplasty implant and graft: Secondary | ICD-10-CM | POA: Diagnosis not present

## 2017-10-31 DIAGNOSIS — R310 Gross hematuria: Secondary | ICD-10-CM | POA: Diagnosis not present

## 2017-10-31 DIAGNOSIS — J449 Chronic obstructive pulmonary disease, unspecified: Secondary | ICD-10-CM | POA: Diagnosis not present

## 2017-10-31 DIAGNOSIS — L708 Other acne: Secondary | ICD-10-CM | POA: Diagnosis not present

## 2017-10-31 DIAGNOSIS — Z7902 Long term (current) use of antithrombotics/antiplatelets: Secondary | ICD-10-CM | POA: Diagnosis not present

## 2017-10-31 DIAGNOSIS — L821 Other seborrheic keratosis: Secondary | ICD-10-CM | POA: Diagnosis not present

## 2017-10-31 HISTORY — DX: Cardiac murmur, unspecified: R01.1

## 2017-10-31 HISTORY — DX: Tinnitus, bilateral: H93.13

## 2017-10-31 HISTORY — PX: SKIN BIOPSY: SHX1

## 2017-10-31 HISTORY — DX: Pneumonia, unspecified organism: J18.9

## 2017-10-31 HISTORY — DX: Other specified disorders of kidney and ureter: N28.89

## 2017-10-31 HISTORY — DX: Vitamin D deficiency, unspecified: E55.9

## 2017-10-31 LAB — BASIC METABOLIC PANEL
ANION GAP: 10 (ref 5–15)
BUN: 25 mg/dL — ABNORMAL HIGH (ref 6–20)
CO2: 24 mmol/L (ref 22–32)
Calcium: 9.1 mg/dL (ref 8.9–10.3)
Chloride: 104 mmol/L (ref 101–111)
Creatinine, Ser: 1.17 mg/dL (ref 0.61–1.24)
GFR calc Af Amer: >60 mL/min (ref >60)
GFR, EST NON AFRICAN AMERICAN: 60 mL/min — AB (ref >60)
GLUCOSE: 98 mg/dL (ref 65–99)
POTASSIUM: 4.4 mmol/L (ref 3.5–5.1)
Sodium: 138 mmol/L (ref 135–145)

## 2017-10-31 LAB — CBC
HCT: 44.2 % (ref 39.0–52.0)
HEMOGLOBIN: 15.1 g/dL (ref 13.0–17.0)
MCH: 30.8 pg (ref 26.0–34.0)
MCHC: 34.2 g/dL (ref 30.0–36.0)
MCV: 90.2 fL (ref 78.0–100.0)
Platelets: 168 10*3/uL (ref 150–400)
RBC: 4.9 MIL/uL (ref 4.22–5.81)
RDW: 13.9 % (ref 11.5–15.5)
WBC: 5.2 10*3/uL (ref 4.0–10.5)

## 2017-10-31 LAB — HEMOGLOBIN A1C
Hgb A1c MFr Bld: 5.4 % (ref 4.8–5.6)
MEAN PLASMA GLUCOSE: 108.28 mg/dL

## 2017-10-31 NOTE — Pre-Procedure Instructions (Signed)
BMP results 10/31/2017 faxed to Dr. Karsten Ro via epic.

## 2017-11-01 DIAGNOSIS — D235 Other benign neoplasm of skin of trunk: Secondary | ICD-10-CM | POA: Diagnosis not present

## 2017-11-02 NOTE — H&P (Signed)
HPI: [name] is a 72 year-old male who presents for transurethral resection of a newly diagnosed bladder tumor.  He did see the blood in his urine. He first noticed the symptoms approximately 10/06/2017. He has seen blood clots.   He does have a burning sensation when he urinates. He is currently having trouble urinating.   He is not having pain. He has not recently had unwanted weight loss.   His last U/S or CT Scan was 10/11/2017.   10/12/17: He began developing gross hematuria after doing some stack in of would and this persisted for about 24 hr until he began to urinate clots and this resulted in clot retention. A Foley catheter was inserted at his last visit and his urine has now cleared. He is on Plavix and aspirin for multiple coronary stents.     ALLERGIES: Adhesive tape - Skin Rash Codeine Derivatives - Other Reaction, hallucination crestor - Other Reaction, muscle pain Cymbalta - Other Reaction, ineffective Dilaudid - Other Reaction, hallucination Effexor - Other Reaction, unknown Gluten Meal - Other Reaction, runny nose Morphine Sulfate - Other Reaction, hallucination NSAIDs - Other Reaction, GI Bleed Penicillins - Skin Rash, Itching Percocet - Other Reaction, hallucination Sulfa Antibiotics - Skin Rash, Itching Topamax - Other Reaction, visual changes "blind" Trazodone and Nefazodone - Other Reaction, Unknown    MEDICATIONS: Finasteride 5 mg tablet 1 tablet PO Daily  Plavix 75 mg tablet  Tamsulosin Hcl 0.4 mg capsule  Alpha Lipoic Acid  Aspirin Ec 81 mg tablet, delayed release  Atrovent Hfa  B Complex  Bisoprolol-Hydrochlorothiazide 5 mg-6.25 mg tablet  Cephalexin 500 mg capsule 1 capsule PO BID  Choline Citrate 650 mg tablet  Fish Oil-Omega-3-Vit D  Flax Seed Oil 9,323 mg capsule  Folic Acid  Lecithin  Magnesium 500 mg capsule  Melatonin  Meloxicam  Multiple Vitamin  Niacin 500 mg tablet capsule  Nitroglycerin 0.4 mg tablet, sublingual  Turmeric 500  mg capsule  Tylenol Extra Strength 500 mg tablet  Valerian  Vitamin C 1,000 mg tablet     GU PSH: Locm 300-399Mg /Ml Iodine,1Ml - 10/11/2017 Prostate Needle Biopsy - 2011      PSH Notes: Anterior Gastropexy For Hiatal Hernia   NON-GU PSH: Back surgery - 1998 Cardiac Stent Placement Carpal Tunnel Surgery.. Cataract Surgery.. Eye Surgery (Unspecified) Hernia Repair Neck Spine Fusion - 2009 Nose Surgery (Unspecified) Stomach Surgery Procedure - 2008    GU PMH: Gross hematuria (Acute), Culture urine. Empirically begin Cephalexin 500 mg 1 po BID X 7 days till culture complete. Begin Finasteride 5 mg 1 po daily. PSA/BUN/crea today. Will proceed with hematuria workup with CT and cysto w/Dr. 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 He then underwent surgery on his neck and at that time all of his prostate medications were stopped and it was felt he should be back on his 5 alpha reductase inhibitor but not an alpha-blocker because of the risk of dizziness and a possible fall with reinjury of his neck. It later turned out that his dizziness  was being caused by Lyrica and when he decreased the dosage the dizziness resolved. He has been taking tamsulosin again but continues to have obstructive voiding symptoms consisting of nocturia 2-3 times as well as significant slowing of his urinary stream.   Elevated PSA: His PSA has been elevated in the past and do to a PSA elevation of 9.05 he underwent transrectal  ultrasound and biopsy of the prostate on 11/24/04 which revealed BPH only. In 4/07 his PSA was as high as 10.98 but the last time it was checked in our office, in 2/09, it was 3.21/22.7%. The PSA on 03/22/09 was found to be 15.37 and a repeat PSA on 04/01/09 was 15.67. He reports that he was taking DHEA and as soon as he heard the laboratory results he stopped taking that. He said he switch to [facility] which seems to be as effective at helping with his mood which is why he took the supplements in the first place. He reports he is not taking any supplements anymore.     NON-GU PMH: Asthma, Asthma - 2014 Personal history of other diseases of the circulatory system, History of cardiac disorder - 2014, History of hypertension, - 2014 Personal history of other endocrine, nutritional and metabolic disease, History of diabetes mellitus - 2014, History of hypercholesterolemia, - 2014 Anxiety Arthritis Chest pain, unspecified Chronic obstructive pulmonary disease with (acute) exacerbation Cluster headache syndrome, unspecified, intractable Constipation, unspecified Coronary Artery Disease Depression Diabetes Type 2 GERD Hypercholesterolemia Hyperlipidemia, unspecified Hypertension Lactose intolerance, unspecified Obesity Osteoarthritis Pain in unspecified joint Personal history of peptic ulcer disease Skin Cancer, History Sleep Apnea    FAMILY HISTORY: 1 Daughter - Daughter Cardiac Failure - Father, Mother, Brother Diabetes - [place] Status Number - Runs In Family   SOCIAL HISTORY: Marital Status: Married Preferred Language: English; Ethnicity: Not Hispanic Or Latino; Race: White Current Smoking Status: Patient does not smoke anymore. Has not smoked since 06/13/1977. Smoked for 20 years. Smoked 2 packs per day.   Tobacco Use Assessment Completed: Used Tobacco in last 30 days? Does not use smokeless tobacco. Has never drank.  Does not use drugs. Drinks 3 caffeinated drinks  per day. Has had a blood transfusion. Patient's occupation is/was retired.    REVIEW OF SYSTEMS:    GU Review Male:   Patient denies frequent urination, hard to postpone urination, burning/ pain with urination, get up at night to urinate, leakage of urine, stream starts and stops, trouble starting your stream, have to strain to urinate , erection problems, and penile pain.  Gastrointestinal (Upper):   Patient denies nausea, vomiting, and indigestion/ heartburn.  Gastrointestinal (Lower):   Patient denies diarrhea and constipation.  Constitutional:   Patient denies fever, night sweats, weight loss, and fatigue.  Skin:   Patient denies skin rash/ lesion and itching.  Eyes:   Patient denies blurred vision and double vision.  Ears/ Nose/ Throat:   Patient denies sore throat and sinus problems.  Hematologic/Lymphatic:   Patient denies swollen glands and easy bruising.  Cardiovascular:   Patient denies leg swelling and chest pains.  Respiratory:   Patient denies cough and shortness of breath.  Endocrine:   Patient denies excessive thirst.  Musculoskeletal:   Patient denies back pain and joint pain.  Neurological:   Patient denies headaches and dizziness.  Psychologic:   Patient denies depression and anxiety.   VITAL SIGNS:    Weight 200 lb / 90.72 kg  Height 67 in / 170.18 cm  BP 162/86 mmHg  Pulse 64 /min  BMI 31.3 kg/m   GU PHYSICAL EXAMINATION:    Anus and Perineum: No hemorrhoids. No anal stenosis. No rectal fissure, no anal fissure. No edema, no dimple, no perineal tenderness, no anal tenderness.   Prostate: Prostate 3 + size. Left lobe normal consistency, right lobe normal consistency. Symmetrical lobes. No prostate nodule. Left lobe no tenderness, right lobe no tenderness.   Sphincter Tone: Normal sphincter. No rectal tenderness. No rectal mass.    MULTI-SYSTEM PHYSICAL EXAMINATION:    Constitutional: Well-nourished. No physical deformities. Normally developed. Good grooming.   Neck: Neck symmetrical, not swollen. Normal tracheal position.  Respiratory: No labored breathing, no use of accessory muscles. Normal breath sounds.  Cardiovascular: Regular rate and rhythm. No murmur, no gallop. Normal temperature, normal extremity pulses, no swelling, no varicosities.  Skin: No paleness, no jaundice, no cyanosis. No lesion, no ulcer, no rash.  Neurologic / Psychiatric: Oriented to time, oriented to place, oriented to person. No depression, no anxiety, no agitation.  Gastrointestinal: No mass, no tenderness, no rigidity, non obese abdomen.  Eyes: Normal conjunctivae. Normal eyelids.  Ears, Nose, Mouth, and Throat: Left ear no scars, no lesions, no masses. Right ear no scars, no lesions, no masses. Nose no scars, no lesions, no masses. Normal hearing. Normal lips.  Musculoskeletal: Normal gait and station of head and neck.    PAST DATA REVIEWED:  Source Of History:  Patient  Lab Test Review:   PSA, BUN/Creatinine  Urine Test Review:   Urine Culture  X-Ray Review: C.T. Hematuria: Reviewed Films. Reviewed Report. Discussed With Patient. EXAM: CT ABDOMEN AND PELVIS WITHOUT AND WITH CONTRAST TECHNIQUE: Multidetector CT imaging of the abdomen and pelvis was performed following the standard protocol before and following the bolus administration of intravenous contrast. CONTRAST: 125 cc Isovue-300 COMPARISON: CT scans from 2008 FINDINGS: Lower chest: Scarring or subsegmental atelectasis in both lower lobes. Lower thoracic aortic and coronary atherosclerosis. Hepatobiliary: Cholecystectomy. Pancreas: Unremarkable Spleen: Unremarkable Adrenals/Urinary Tract: Enhancing exophytic mass medially from the left kidney upper pole measuring 2.6 by 2.4 by 2.4 cm, no internal fat density, high suspicion for renal cell carcinoma. 1.1 cm exophytic hyperdense lesion from the left mid to lower kidney posterolaterally, difficult to assess for enhancement given the severity of streak artifact from the  patient's lumbar hardware interfering with density measurements on the precontrast images, as well as volume averaging on the precontrast images. This is considered indeterminate because of this, although likely a benign Bosniak category 2 cyst. Exophytic 1.8 by 1.4 cm lesion from the left kidney lower pole anteriorly appears mildly complex with precontrast density of 33 Hounsfield units, although adversely affected by streak artifact. No definite enhancement based on density measurements. Adrenal glands normal. 4 mm nonobstructive left kidney lower pole calculus. There is a Foley catheter in the urinary bladder. The urinary bladder is relatively empty. There is some filling defect posteriorly in the urinary bad bladder behind the Foley catheter balloon, which could be tumor or blood clot. Blood clot is favored given the lack of obvious enhancement, although not absolutely certain. Stomach/Bowel: Postoperative findings at the gastroesophageal junction. Mild sigmoid colon diverticulosis. Vascular/Lymphatic: Aortoiliac atherosclerotic vascular disease. No tumor thrombus is observed in the left renal vein. No appreciable pathologic adenopathy. Reproductive: Mild prostatomegaly. Other: Rim calcified lesions along the right omentum, along with a oval-shaped lesion in the right lower quadrant omentum favoring prior omental infarct. Musculoskeletal: Lucencies in the right iliac bone are compatible with prior bone graft harvesting. No compelling findings  of osseous metastatic disease. Posterolateral rod and pedicle screw fixation bilaterally at L3-L4-L5 with interbody cages at the L4-5 level, and mild levoconvex lumbar scoliosis. 5 mm anterolisthesis at L2-3 with evidence of degenerative disc disease at this level. I do not see findings of osseous metastatic disease. Remote inferior endplate compression at T12. IMPRESSION: 1. Exophytic enhancing 2.6 cm mass from the left kidney upper pole medially, compatible with renal  cell carcinoma. No tumor thrombus in the left renal vein or adenopathy, or evidence of distant metastatic spread. 2. Two complex additional lesions of the left kidney are likely complex cysts, although streak artifact from the patient's spinal orthopedic hardware restricts are assessment for enhancement. 3. Nonobstructive 4 mm left kidney lower pole calculus. 4. Filling defect in the urinary bladder posterior to the Foley catheter balloon, favoring blood clot, less likely to be urothelial tumor. 5. Other imaging findings of potential clinical significance: Bibasilar scarring. Aortic Atherosclerosis (ICD10-I70.0). Mild sigmoid colon diverticulosis. Prostatomegaly. 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 5 mm anterolisthesis at L2-3 with degenerative disc disease.     10/09/17 03/31/10 12/22/09 11/12/09 07/06/09 08/09/07 02/08/07 12/20/05  PSA  Total PSA 6.57 ng/mL 15.29  20.30  14.60  19.00  3.21  5.82  9.02   Free PSA      0.73  1.44  1.45   % Free PSA      22.7  24.7  16.1     09/14/05  Hormones  Testosterone, Total 4.21     10/09/17  General Chemistry  Creatinine 1.1 mg/dL  Notes:                     His urine culture was found to be negative. His creatinine was normal and his PSA was 6.57.   PROCEDURES:         Flexible Cystoscopy - done on 10/12/17. Risks, benefits, and some of the potential complications of the procedure were discussed at length with the patient including infection, bleeding, voiding discomfort, urinary retention, fever, chills, sepsis, and others. All questions were answered. Informed consent was obtained. Sterile technique and 2% Lidocaine intraurethral analgesia were used.  Meatus:  Normal size. Normal location. Normal condition.  Urethra:  No strictures.  External Sphincter:  Normal.  Verumontanum:  Normal.  Prostate:  There was evidence of prior surgery of his prostatic urethra. His prostate did not appear to be particularly obstructing.  Bladder Neck:   Non-obstructing.  Ureteral Orifices:  Normal location. Normal size. Normal shape. Effluxed clear urine.  Bladder:  There was mild trabeculation. I noted on odd smooth, pedunculated growth emanating from the left wall. There was erythema on the posterior wall consistent with catheter irritation as well.      The lower urinary tract was carefully examined. The procedure was well-tolerated and without complications. Instructions were given to call the office immediately for bloody urine, difficulty urinating, urinary retention, painful or frequent urination, fever or other illness. The patient stated that he understood these instructions and would comply with them.         Urinalysis Dipstick Dipstick Cont'd Micro  Color: Yellow Bilirubin: Neg WBC/hpf: 0 - 5/hpf  Appearance: Clear Ketones: Neg RBC/hpf: 10 - 20/hpf  Specific Gravity: 1.025 Blood: 3+ Bacteria: NS (Not Seen)  pH: 6.0 Protein: 3+ Cystals: NS (Not Seen)  Glucose: Neg Urobilinogen: 0.2 Casts: NS (Not Seen)    Nitrites: Neg Trichomonas: Not Present    Leukocyte Esterase: Trace Mucous: Not Present  Epithelial Cells: NS (Not Seen)      Yeast: NS (Not Seen)      Sperm: Not Present    ASSESSMENT/PLAN:       ICD-10 Details  1 GU:   Gross hematuria - R31.0 Stable - His gross hematuria appears to be secondary to a lesion noted in his bladder. We discussed the need for transurethral resection and I went over the procedure with him in detail. We discussed the risks and complications, the outpatient nature of the procedure and the anticipated postoperative course as well as the probability of success.  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  3   Renal cyst - N28.1 Left, The incidentally noted renal cysts are of no  clinical significance.  4   Renal calculus - N20.0 Left, His left renal calculus is nonobstructing and peripherally located. This can be monitored.  5   Urinary Retention - R33.8 Improving - He did not have a particularly obstructing prostate. He has had prior laser surgery of the prostate and his retention was clot retention. Since his urine is now clear I left his Foley catheter out today.  6   Elevated PSA - R97.20 Improving - His PSA at 6.57 is actually lower than it is been for many years. I will continue to monitor this.

## 2017-11-03 ENCOUNTER — Encounter (HOSPITAL_COMMUNITY): Payer: Self-pay

## 2017-11-03 ENCOUNTER — Ambulatory Visit (HOSPITAL_COMMUNITY)
Admission: RE | Admit: 2017-11-03 | Discharge: 2017-11-03 | Disposition: A | Discharge status: Home / Self Care | Payer: Medicare Other | Source: Ambulatory Visit | Attending: Urology | Admitting: Urology

## 2017-11-03 ENCOUNTER — Ambulatory Visit (HOSPITAL_COMMUNITY): Payer: Medicare Other | Admitting: Anesthesiology

## 2017-11-03 ENCOUNTER — Encounter (HOSPITAL_COMMUNITY)
Admission: RE | Disposition: A | Discharge status: Home / Self Care | Payer: Self-pay | Source: Ambulatory Visit | Attending: Urology

## 2017-11-03 DIAGNOSIS — G709 Myoneural disorder, unspecified: Secondary | ICD-10-CM | POA: Diagnosis not present

## 2017-11-03 DIAGNOSIS — R338 Other retention of urine: Secondary | ICD-10-CM | POA: Insufficient documentation

## 2017-11-03 DIAGNOSIS — Z7902 Long term (current) use of antithrombotics/antiplatelets: Secondary | ICD-10-CM | POA: Insufficient documentation

## 2017-11-03 DIAGNOSIS — Z79899 Other long term (current) drug therapy: Secondary | ICD-10-CM | POA: Insufficient documentation

## 2017-11-03 DIAGNOSIS — Z955 Presence of coronary angioplasty implant and graft: Secondary | ICD-10-CM | POA: Diagnosis not present

## 2017-11-03 DIAGNOSIS — Z87891 Personal history of nicotine dependence: Secondary | ICD-10-CM | POA: Diagnosis not present

## 2017-11-03 DIAGNOSIS — I2510 Atherosclerotic heart disease of native coronary artery without angina pectoris: Secondary | ICD-10-CM | POA: Diagnosis not present

## 2017-11-03 DIAGNOSIS — R310 Gross hematuria: Secondary | ICD-10-CM | POA: Insufficient documentation

## 2017-11-03 DIAGNOSIS — E1151 Type 2 diabetes mellitus with diabetic peripheral angiopathy without gangrene: Secondary | ICD-10-CM | POA: Diagnosis not present

## 2017-11-03 DIAGNOSIS — Z791 Long term (current) use of non-steroidal anti-inflammatories (NSAID): Secondary | ICD-10-CM | POA: Insufficient documentation

## 2017-11-03 DIAGNOSIS — Z7982 Long term (current) use of aspirin: Secondary | ICD-10-CM | POA: Insufficient documentation

## 2017-11-03 DIAGNOSIS — I10 Essential (primary) hypertension: Secondary | ICD-10-CM | POA: Insufficient documentation

## 2017-11-03 DIAGNOSIS — J449 Chronic obstructive pulmonary disease, unspecified: Secondary | ICD-10-CM | POA: Diagnosis not present

## 2017-11-03 DIAGNOSIS — N329 Bladder disorder, unspecified: Secondary | ICD-10-CM | POA: Diagnosis not present

## 2017-11-03 DIAGNOSIS — N3289 Other specified disorders of bladder: Secondary | ICD-10-CM | POA: Diagnosis not present

## 2017-11-03 DIAGNOSIS — N401 Enlarged prostate with lower urinary tract symptoms: Secondary | ICD-10-CM | POA: Diagnosis not present

## 2017-11-03 HISTORY — PX: TRANSURETHRAL RESECTION OF BLADDER TUMOR: SHX2575

## 2017-11-03 LAB — GLUCOSE, CAPILLARY
Glucose-Capillary: 88 mg/dL (ref 65–99)
Glucose-Capillary: 100 mg/dL — ABNORMAL HIGH (ref 65–99)

## 2017-11-03 SURGERY — TURBT (TRANSURETHRAL RESECTION OF BLADDER TUMOR)
Anesthesia: General

## 2017-11-03 MED ORDER — SODIUM CHLORIDE 0.9 % IV SOLN
1.0000 g | Freq: Once | INTRAVENOUS | Status: AC
  Administered 2017-11-03: 1 g via INTRAVENOUS
  Filled 2017-11-03: qty 1

## 2017-11-03 MED ORDER — ROCURONIUM BROMIDE 50 MG/5ML IV SOSY
PREFILLED_SYRINGE | INTRAVENOUS | Status: DC | PRN
  Administered 2017-11-03: 50 mg via INTRAVENOUS

## 2017-11-03 MED ORDER — SUGAMMADEX SODIUM 200 MG/2ML IV SOLN
INTRAVENOUS | Status: DC | PRN
  Administered 2017-11-03: 370 mg via INTRAVENOUS

## 2017-11-03 MED ORDER — ROCURONIUM BROMIDE 10 MG/ML (PF) SYRINGE
PREFILLED_SYRINGE | INTRAVENOUS | Status: AC
  Filled 2017-11-03: qty 5

## 2017-11-03 MED ORDER — FENTANYL CITRATE (PF) 100 MCG/2ML IJ SOLN
INTRAMUSCULAR | Status: DC | PRN
  Administered 2017-11-03 (×2): 50 ug via INTRAVENOUS

## 2017-11-03 MED ORDER — PHENYLEPHRINE HCL 10 MG/ML IJ SOLN
INTRAMUSCULAR | Status: AC
  Filled 2017-11-03: qty 1

## 2017-11-03 MED ORDER — PROMETHAZINE HCL 25 MG/ML IJ SOLN: 6.2500 mg | INTRAMUSCULAR | Status: DC | PRN

## 2017-11-03 MED ORDER — LACTATED RINGERS IV SOLN
INTRAVENOUS | Status: DC
  Administered 2017-11-03: 09:00:00 via INTRAVENOUS

## 2017-11-03 MED ORDER — DEXAMETHASONE SODIUM PHOSPHATE 10 MG/ML IJ SOLN
INTRAMUSCULAR | Status: AC
  Filled 2017-11-03: qty 1

## 2017-11-03 MED ORDER — LACTATED RINGERS IV SOLN: INTRAVENOUS | Status: DC

## 2017-11-03 MED ORDER — FENTANYL CITRATE (PF) 100 MCG/2ML IJ SOLN
INTRAMUSCULAR | Status: AC
  Filled 2017-11-03: qty 2

## 2017-11-03 MED ORDER — MEPERIDINE HCL 50 MG/ML IJ SOLN: 6.2500 mg | INTRAMUSCULAR | Status: DC | PRN

## 2017-11-03 MED ORDER — DEXAMETHASONE SODIUM PHOSPHATE 10 MG/ML IJ SOLN
INTRAMUSCULAR | Status: DC | PRN
  Administered 2017-11-03: 5 mg via INTRAVENOUS

## 2017-11-03 MED ORDER — ONDANSETRON HCL 4 MG/2ML IJ SOLN
INTRAMUSCULAR | Status: AC
  Filled 2017-11-03: qty 2

## 2017-11-03 MED ORDER — PROPOFOL 10 MG/ML IV BOLUS
INTRAVENOUS | Status: DC | PRN
  Administered 2017-11-03: 130 mg via INTRAVENOUS
  Administered 2017-11-03: 50 mg via INTRAVENOUS

## 2017-11-03 MED ORDER — LIDOCAINE 2% (20 MG/ML) 5 ML SYRINGE
INTRAMUSCULAR | Status: DC | PRN
  Administered 2017-11-03: 80 mg via INTRAVENOUS

## 2017-11-03 MED ORDER — FENTANYL CITRATE (PF) 100 MCG/2ML IJ SOLN: 25.0000 ug | INTRAMUSCULAR | Status: DC | PRN

## 2017-11-03 MED ORDER — SUGAMMADEX SODIUM 200 MG/2ML IV SOLN
INTRAVENOUS | Status: AC
  Filled 2017-11-03: qty 2

## 2017-11-03 MED ORDER — LIDOCAINE 2% (20 MG/ML) 5 ML SYRINGE
INTRAMUSCULAR | Status: AC
  Filled 2017-11-03: qty 5

## 2017-11-03 MED ORDER — ONDANSETRON HCL 4 MG/2ML IJ SOLN
INTRAMUSCULAR | Status: DC | PRN
  Administered 2017-11-03: 4 mg via INTRAVENOUS

## 2017-11-03 MED ORDER — PROPOFOL 10 MG/ML IV BOLUS
INTRAVENOUS | Status: AC
  Filled 2017-11-03: qty 20

## 2017-11-03 SURGICAL SUPPLY — 17 items
BAG URINE DRAINAGE (UROLOGICAL SUPPLIES) IMPLANT
BAG URO CATCHER STRL LF (MISCELLANEOUS) ×2 IMPLANT
COVER FOOTSWITCH UNIV (MISCELLANEOUS) ×2 IMPLANT
COVER SURGICAL LIGHT HANDLE (MISCELLANEOUS) IMPLANT
ELECT REM PT RETURN 15FT ADLT (MISCELLANEOUS) IMPLANT
EVACUATOR MICROVAS BLADDER (UROLOGICAL SUPPLIES) ×2 IMPLANT
GLOVE BIOGEL M 8.0 STRL (GLOVE) ×6 IMPLANT
GOWN STRL REUS W/TWL XL LVL3 (GOWN DISPOSABLE) ×4 IMPLANT
HOLDER FOLEY CATH W/STRAP (MISCELLANEOUS) IMPLANT
LOOP CUT BIPOLAR 24F LRG (ELECTROSURGICAL) IMPLANT
MANIFOLD NEPTUNE II (INSTRUMENTS) ×2 IMPLANT
NS IRRIG 1000ML POUR BTL (IV SOLUTION) ×2 IMPLANT
PACK CYSTO (CUSTOM PROCEDURE TRAY) ×2 IMPLANT
SET ASPIRATION TUBING (TUBING) ×2 IMPLANT
SYRINGE IRR TOOMEY STRL 70CC (SYRINGE) ×2 IMPLANT
TUBING CONNECTING 10 (TUBING) ×2 IMPLANT
TUBING UROLOGY SET (TUBING) ×2 IMPLANT

## 2017-11-03 NOTE — Discharge Instructions (Signed)

## 2017-11-03 NOTE — Anesthesia Procedure Notes (Signed)
Procedure Name: Intubation Date/Time: 11/03/2017 11:14 AM Performed by: Montel Clock, CRNA Pre-anesthesia Checklist: Patient identified, Emergency Drugs available, Suction available, Patient being monitored and Timeout performed Patient Re-evaluated:Patient Re-evaluated prior to induction Oxygen Delivery Method: Circle system utilized Preoxygenation: Pre-oxygenation with 100% oxygen Induction Type: IV induction Ventilation: Two handed mask ventilation required and Oral airway inserted - appropriate to patient size Laryngoscope Size: Mac and 3 Grade View: Grade II Tube type: Oral Tube size: 7.5 mm Number of attempts: 1 Airway Equipment and Method: Stylet Placement Confirmation: ETT inserted through vocal cords under direct vision,  positive ETCO2 and breath sounds checked- equal and bilateral Secured at: 23 cm Tube secured with: Tape Dental Injury: Teeth and Oropharynx as per pre-operative assessment  Comments: Initially required 2-hand mask with OA 9 to mask ventilate. Required one-hand after patient relaxed.

## 2017-11-03 NOTE — Op Note (Signed)
PATIENT:  [name]  PRE-OPERATIVE DIAGNOSIS: 1.  History of gross hematuria. 2.  Bladder lesion cystoscopy  POST-OPERATIVE DIAGNOSIS: Same  PROCEDURE: Cystoscopy 72 year old male who developed gross hematuria and clot retention.  A Foley catheter was inserted and  SURGEON:  Claybon Jabs  INDICATION: [name] is a a CT scan was obtained.  It revealed lesions within the kidneys but these were not felt to be the source of his hematuria as they were peripherally located.  There was an area in the bladder noted on CT scan posterior to the Foley catheter balloon that was felt to possibly represent a bladder tumor or possibly clot.  cystoscopic evaluation was undertaken and I found what appeared to be a lesion that was pedunculated and covered with normal-appearing mucosa to the left of midline on the left wall.  He is brought to the operating room for resection of the lesion.  He also had a slightly erythematous area on the posterior wall in the midline most consistent with Foley catheter irritation but this is going to be evaluated as well.  ANESTHESIA:  General  EBL: None  DRAINS: None  LOCAL MEDICATIONS USED:  None  SPECIMEN: None  Description of procedure: After informed consent the patient was taken to the operating room and placed on the table in a supine position. General anesthesia was then administered. Once fully anesthetized the patient was moved to the dorsal lithotomy position and the genitalia were sterilely prepped and draped in standard fashion. An official timeout was then performed.  The 24 French resectoscope with visual obturator was passed down the urethra using a 30 degree lens.  The urethra was noted to be entirely normal.  The prostatic urethra revealed some bilobar hypertrophy and elongation but no lesions.  Upon entering the bladder I noted 2+ trabeculation.  The right and left ureteral orifice were noted to be of normal configuration and position.   They were effluxing clear urine.  I did a full and systematic inspection of the bladder and found that the area on the posterior wall had completely resolved.  The mucosa was noted to be entirely normal and there was no evidence of papillary lesion or erythema.  I then searched for the pedunculated lesion and was unable to locate the lesion.  I reviewed the CT scan images once again in the operating room and again could identify the lesion but was unable to locate any pedunculated lesion within the bladder on the left wall, on the floor of the bladder or anywhere else within the bladder with the bladder filled to capacity and also with the bladder completely emptied.  I therefore emptied the bladder and removed the resectoscope.  The patient was awakened and taken to recovery room in stable satisfactory condition.  He tolerated the procedure well with no intraoperative complications.  PLAN OF CARE: Discharge to home after PACU  PATIENT DISPOSITION:  PACU - hemodynamically stable.

## 2017-11-03 NOTE — Transfer of Care (Signed)
Immediate Anesthesia Transfer of Care Note  Patient: [name]  Procedure(s) Performed: cystoscopy (N/A )  Patient Location: PACU  Anesthesia Type:General  Level of Consciousness: drowsy and patient cooperative  Airway & Oxygen Therapy: Patient Spontanous Breathing and Patient connected to face mask oxygen  Post-op Assessment: Report given to RN and Post -op Vital signs reviewed and stable  Post vital signs: Reviewed and stable  Last Vitals:  Vitals Value Taken Time  BP 102/75 11/03/2017 11:54 AM  Temp    Pulse 76 11/03/2017 11:55 AM  Resp 9 11/03/2017 11:55 AM  SpO2 95 % 11/03/2017 11:55 AM  Vitals shown include unvalidated device data.  Last Pain:  Vitals:   11/03/17 0911  TempSrc:   PainSc: 2       Patients Stated Pain Goal: 2 (67/89/38 [id number])  Complications: No apparent anesthesia complications

## 2017-11-03 NOTE — Anesthesia Postprocedure Evaluation (Signed)
Anesthesia Post Note  Patient: [name] [facility]  Procedure(s) Performed: cystoscopy (N/A )     Patient location during evaluation: PACU Anesthesia Type: General Level of consciousness: awake and alert Pain management: pain level controlled Vital Signs Assessment: post-procedure vital signs reviewed and stable Respiratory status: spontaneous breathing, nonlabored ventilation, respiratory function stable and patient connected to nasal cannula oxygen Cardiovascular status: blood pressure returned to baseline and stable Postop Assessment: no apparent nausea or vomiting Anesthetic complications: no    Last Vitals:  Vitals:   11/03/17 1300 11/03/17 1345  BP: (!) 149/83 (!) 152/96  Pulse: 62 73  Resp: 16 16  Temp: 36.4 C 36.5 C  SpO2: 99% 99%    Last Pain:  Vitals:   11/03/17 1345  TempSrc: Oral  PainSc: 0-No pain                 Effie Berkshire

## 2017-11-03 NOTE — Anesthesia Preprocedure Evaluation (Addendum)
Anesthesia Evaluation  Patient identified by MRN, date of birth, ID band Patient awake    Reviewed: Allergy & Precautions, NPO status , Patient's Chart, lab work & pertinent test results  Airway Mallampati: I  TM Distance: >3 FB Neck ROM: Full    Dental  (+) Upper Dentures, Partial Lower, Dental Advisory Given   Pulmonary asthma , sleep apnea , COPD, former smoker,    breath sounds clear to auscultation       Cardiovascular hypertension, Pt. on home beta blockers and Pt. on medications + CAD, + Cardiac Stents and + Peripheral Vascular Disease   Rhythm:Regular Rate:Normal     Neuro/Psych  Headaches, PSYCHIATRIC DISORDERS Anxiety Depression  Neuromuscular disease    GI/Hepatic Neg liver ROS, PUD, GERD  ,  Endo/Other  diabetes, Type 2  Renal/GU      Musculoskeletal  (+) Arthritis ,   Abdominal Normal abdominal exam  (+)   Peds  Hematology   Anesthesia Other Findings - HLD  Reproductive/Obstetrics                            Lab Results  Component Value Date   WBC 5.2 10/31/2017   HGB 15.1 10/31/2017   HCT 44.2 10/31/2017   MCV 90.2 10/31/2017   PLT 168 10/31/2017   Lab Results  Component Value Date   CREATININE 1.17 10/31/2017   BUN 25 (H) 10/31/2017   NA 138 10/31/2017   K 4.4 10/31/2017   CL 104 10/31/2017   CO2 24 10/31/2017   Lab Results  Component Value Date   INR 1.2 (H) 01/04/2011   INR 1.0 06/03/2007   EKG: normal sinus rhythm.  Anesthesia Physical Anesthesia Plan  ASA: III  Anesthesia Plan: General   Post-op Pain Management:    Induction: Intravenous  PONV Risk Score and Plan: 3 and Ondansetron, Dexamethasone, Midazolam and Treatment may vary due to age or medical condition  Airway Management Planned: Oral ETT  Additional Equipment: None  Intra-op Plan:   Post-operative Plan: Extubation in OR  Informed Consent: I have reviewed the patients History and  Physical, chart, labs and discussed the procedure including the risks, benefits and alternatives for the proposed anesthesia with the patient or authorized representative who has indicated his/her understanding and acceptance.   Dental advisory given  Plan Discussed with: CRNA  Anesthesia Plan Comments:       Anesthesia Quick Evaluation

## 2017-11-13 DIAGNOSIS — L570 Actinic keratosis: Secondary | ICD-10-CM | POA: Diagnosis not present

## 2017-11-13 DIAGNOSIS — D49512 Neoplasm of unspecified behavior of left kidney: Secondary | ICD-10-CM | POA: Diagnosis not present

## 2017-11-16 ENCOUNTER — Ambulatory Visit (INDEPENDENT_AMBULATORY_CARE_PROVIDER_SITE_OTHER): Payer: Medicare Other | Admitting: Internal Medicine

## 2017-11-16 VITALS — BP 134/84 | HR 76 | Temp 97.5°F | Resp 18 | Ht 67.0 in | Wt 202.8 lb

## 2017-11-16 DIAGNOSIS — R7303 Prediabetes: Secondary | ICD-10-CM

## 2017-11-16 DIAGNOSIS — R7309 Other abnormal glucose: Secondary | ICD-10-CM

## 2017-11-16 DIAGNOSIS — M109 Gout, unspecified: Secondary | ICD-10-CM | POA: Diagnosis not present

## 2017-11-16 DIAGNOSIS — E559 Vitamin D deficiency, unspecified: Secondary | ICD-10-CM

## 2017-11-16 DIAGNOSIS — Z79899 Other long term (current) drug therapy: Secondary | ICD-10-CM | POA: Diagnosis not present

## 2017-11-16 DIAGNOSIS — E782 Mixed hyperlipidemia: Secondary | ICD-10-CM

## 2017-11-16 DIAGNOSIS — I2510 Atherosclerotic heart disease of native coronary artery without angina pectoris: Secondary | ICD-10-CM

## 2017-11-16 DIAGNOSIS — I10 Essential (primary) hypertension: Secondary | ICD-10-CM

## 2017-11-16 NOTE — Progress Notes (Signed)
This very nice 72 y.o. MWM presents for 3 month follow up with HTN, HLD, Pre-Diabetes and Vitamin D Deficiency. In May, patient had TUR of a bladder tumor by Dr Karsten Ro.      Patient is treated for HTN (1999)  & BP has been controlled at home. Today's BP was initially elevated and rechecked at goal - 134/84. In 1992 , he had PCA/Stents locally by Dr Wynonia Lawman followed at [facility] for Laser angioplasty. Heart cath in 2012 found patent Stents. Patient has had no complaints of any cardiac type chest pain, palpitations, dyspnea / orthopnea / PND, dizziness, claudication, or dependent edema.     Hyperlipidemia is controlled with diet & meds. Patient denies myalgias or other med SE's. Last Lipids were  Lab Results  Component Value Date   CHOL 225 (H) 11/16/2017   HDL 37 (L) 11/16/2017   LDLCALC 142 (H) 11/16/2017   LDLDIRECT 116.0 06/03/2013   TRIG 324 (H) 11/16/2017   CHOLHDL 6.1 (H) 11/16/2017      Also, the patient has history of T2_DM (2012) and with improved diet & weight loss his A1c 5.8 in 2013 and then 5.5% in 2014.  He denies symptoms of reactive hypoglycemia, diabetic polys, paresthesias or visual blurring.  Last A1c remains in Nl range: Lab Results  Component Value Date   HGBA1C 5.4 11/16/2017      Further, the patient also has history of Vitamin D Deficiency and supplements vitamin D without any suspected side-effects. Last vitamin D was at goal:  Lab Results  Component Value Date   VD25OH 76 11/16/2017   Current Outpatient Medications on File Prior to Visit  Medication Sig  . acetaminophen  500 MG tablet Take 1 tablet  every 6  hrs as needed.   . ALPHA-LIPOIC ACID Take 1 capsule by mouth 2 (two) times daily.  Marland Kitchen VITAMIN C 1000 MG tablet Take 1,000 mg by mouth 2 (two) times daily.   Marland Kitchen aspirin EC 81 MG tablet Take 81 mg by mouth every evening.   . B Complex Vitamins  Take 1 capsule by mouth daily.   . bisoprolol-hctz 10-6.25 MG tablet Take 1 tablet by mouth daily.  . Choline   Citrate 650 MG  Take 650 mg by mouth daily.   . clopidogrel  75 MG tablet TAKE 1 TABLET BY MOUTH ONCE DAILY  . finasteride 5 MG tablet Take 5 mg by mouth daily.  . fish oil-omega-3  1000 MG c Take 1 g by mouth 2 (two) times daily.   Marland Kitchen FLAX SEED OIL 1000 MG Take 1,000 mg by mouth 2 (two) times daily.   . folic acid  099 MCG tablet Take 800 mcg by mouth at bedtime.   . ATROVENT 0.03 % nasal spry Place 1-2 sprays into both nostrils daily as needed (for allergies.).   Marland Kitchen Lecithin 1200 MG CAPS Take 1,200 mg by mouth 2 (two) times daily.  . Magnesium 500 MG TABS Take 1,500 mg by mouth at bedtime.   . Melatonin 10 MG TABS Take 30 mg by mouth at bedtime.  . meloxicam (15 MG tablet Take 1/2 to 1 tablet daily with food for pain & inflammation   . Multi-Vit W/Min Take 1 tablet by mouth daily.  . niacin 500 MG tablet Take 500 mg by mouth 2 (two) times daily.  Marland Kitchen NITROSTAT 0.4 MG  as needed for chest pain.  . tamsulosin  0.4 MG  Take 1 capsule daily for Prostate  .  VISINE-AC 0.05-0.25 %  1-2 drops into  eyes 3  times daily as needed  . Turmeric 500 MG CAPS Take 1,500 mg by mouth 2 (two) times daily.   Marland Kitchen VALERIAN  Take 1,200 mg by mouth at bedtime. 400 mg tablets   Allergies  Allergen Reactions  . Codeine Itching and Other (See Comments)    Also hallucinations   . Morphine And Related Other (See Comments)    Hallucinations  . Nsaids Other (See Comments)    BLEEDING--naprosyn  . Crestor [Rosuvastatin] Other (See Comments)    Soreness/aching  . Cymbalta [Duloxetine Hcl] Other (See Comments)    Pt is unsure of reaction type  . Dilaudid [Hydromorphone Hcl] Itching and Other (See Comments)    Hallucinations.  . Effexor [Venlafaxine] Other (See Comments)    Unsure of reactions type  . Gluten Meal Other (See Comments)    Runny nose (constant); bloating.  Marland Kitchen Percocet [Oxycodone-Acetaminophen] Itching    Hallucination.  . Topamax [Topiramate] Other (See Comments)    Caused visual impairments/swelling  in eyes--pinch off optic nerve (temporary blindness)  . Trazodone And Nefazodone Other (See Comments)    Unsure of reaction type  . Adhesive [Tape] Other (See Comments)    SKIN PEELS--PAPER TAPE IS OKAY  . Penicillins Rash  . Sulfa Antibiotics Swelling, Rash and Other (See Comments)    Mouth sores   PMHx:   Past Medical History:  Diagnosis Date  . Anxiety    treated /w Xanax for mild depression , using 2 times per day, not PRN  . Asthma   . Benign prostatic hypertrophy   . CAD (coronary artery disease)    pt last left heart cath was in Nov 2008. EF was 55% on left ventriculogram. Circumflex was totally occluded after the 1st obtuse marginal with collaterals supplying the distal circumflex. LAD showed luminal irregularities. The stent in LAD was patent. The RCA showed luminal irregularities. The stent in th emid RCA and PDA were patent. There were no inverventions.   . Cancer ([place])    basal cell- face & head  . Chest pain   . Chronic obstructive pulmonary disease (HCC)    asthma  . Cluster headache    relative to sinus problems   . Constipation   . Depression   . DM2 (diabetes mellitus, type 2) ([place])    well with diet and exercise controlled  . GERD (gastroesophageal reflux disease)    hiatal hernia. Patient did have a Nissen fundoplication  . Heart murmur   . History of blood transfusion    need for Bld. transfusion relative to taking NSAIDS  . HTN (hypertension)    pt. followed by [facility], last cardiac visit 2012, one yr. ago  . Hyperlipidemia   . Joint pain   . Lactose intolerance   . Left kidney mass   . Low back pain    chronic  . 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 Obesity   . 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 Osteoarthritis   . Peptic ulcer disease   . Pneumonia   . Sleep concern    states the study (2003 )was done at [facility]., it failed & he was told to  return & he never did. Pt. states he has been found by prev. hosp. staff that he has to be told when to breathe & his  wife does the same.   Marland Kitchen Spinal fracture    hx of traumatic in Jan 2009 after falling off the roof  . Tinnitus, bilateral   . Vitamin D deficiency    Immunization History  Administered Date(s) Administered  . DT 12/09/2014  . Influenza Split 02/17/2012  . Influenza, High Dose Seasonal PF 03/03/2014, 03/11/2015, 04/04/2016, 04/28/2017  . Pneumococcal Conjugate-13 12/25/2014  . Pneumococcal Polysaccharide-23 01/19/2012  . Pneumococcal-Unspecified 06/13/1997  . Td 06/13/2004  . Zoster 06/14/2007   Past Surgical History:  Procedure Laterality Date  . BACK SURGERY     x3- last surgery lumbar- 1998- / fusion   . blepheroplasty     both eyesx2  . C5-T6 posterior fusion     with [place] and radius screws  . [place], 2008 & 2012 multiple stents  total 4 times  . cataracts     cataracts removed- /w IOL - both eyes   . CERVICAL SPINE SURGERY    . COLONOSCOPY    . ESOPHAGEAL MANOMETRY    . HARDWARE REMOVAL  02/20/2012   Procedure: HARDWARE REMOVAL;  Surgeon: [name], MD;  Location: [facility]UR[facility] ORS;  Service: Neurosurgery;  Laterality: N/A;  Hardware Removal  . HIATAL HERNIA REPAIR    . LAPAROSCOPIC CHOLECYSTECTOMY     & IOC  . multiple percutaneous coronary interventions    . NASAL SINUS SURGERY     x2, sees Dr. [name], still having problems, states he uses Benadryl PRN- up to 5 times per day   . right carpal tunnel release    . right temporal artery biopsy    . Rotator cuff surgery Right   . SKIN BIOPSY Right 10/31/2017   Chest  . TRANSURETHRAL RESECTION OF BLADDER TUMOR N/A 11/03/2017   Procedure: cystoscopy;  Surgeon: Kathie Rhodes, MD;  Location: [facility] ORS;  Service: Urology;  Laterality: N/A;  . UMBILICAL HERNIA REPAIR    . UPPER GI ENDOSCOPY     FHx:    Reviewed / unchanged  SHx:    Reviewed / unchanged   Systems  Review:  Constitutional: Denies fever, chills, wt changes, headaches, insomnia, fatigue, night sweats, change in appetite. Eyes: Denies redness, blurred vision, diplopia, discharge, itchy, watery eyes.  ENT: Denies discharge, congestion, post nasal drip, epistaxis, sore throat, earache, hearing loss, dental pain, tinnitus, vertigo, sinus pain, snoring.  CV: Denies chest pain, palpitations, irregular heartbeat, syncope, dyspnea, diaphoresis, orthopnea, PND, claudication or edema. Respiratory: denies cough, dyspnea, DOE, pleurisy, hoarseness, laryngitis, wheezing.  Gastrointestinal: Denies dysphagia, odynophagia, heartburn, reflux, water brash, abdominal pain or cramps, nausea, vomiting, bloating, diarrhea, constipation, hematemesis, melena, hematochezia  or hemorrhoids. Genitourinary: Denies dysuria, frequency, urgency, nocturia, hesitancy, discharge, hematuria or flank pain. Musculoskeletal: Denies arthralgias, myalgias, stiffness, jt. swelling, pain, limping or strain/sprain.  Skin: Denies pruritus, rash, hives, warts, acne, eczema or change in skin lesion(s). Neuro: No weakness, tremor, incoordination, spasms, paresthesia or pain. Psychiatric: Denies confusion, memory loss or sensory loss. Endo: Denies change in weight, skin or hair change.  Heme/Lymph: No excessive bleeding, bruising or enlarged lymph nodes.  Physical Exam  BP 134/84   Pulse 76   Temp (!) 97.5 F (36.4 C)   Resp 18   Ht 5\' 7"  (1.702 m)   Wt 202 lb 12.8 oz (92 kg)   BMI 31.76 kg/m   Appears  well nourished, well groomed  and in no distress.  Eyes: PERRLA, EOMs, conjunctiva no swelling or erythema. Sinuses: No frontal/maxillary tenderness  ENT/Mouth: EAC's clear, TM's nl w/o erythema, bulging. Nares clear w/o erythema, swelling, exudates. Oropharynx clear without erythema or exudates. Oral hygiene is good. Tongue normal, non obstructing. Hearing intact.  Neck: Supple. Thyroid not palpable. Car 2+/2+ without bruits,  nodes or JVD. Chest: Respirations nl with BS clear & equal w/o rales, rhonchi, wheezing or stridor.  Cor: Heart sounds normal w/ regular rate and rhythm without sig. murmurs, gallops, clicks or rubs. Peripheral pulses normal and equal  without edema.  Abdomen: Soft & bowel sounds normal. Non-tender w/o guarding, rebound, hernias, masses or organomegaly.  Lymphatics: Unremarkable.  Musculoskeletal: Full ROM all peripheral extremities, joint stability, 5/5 strength and normal gait.  Skin: Warm, dry without exposed rashes, lesions or ecchymosis apparent.  Neuro: Cranial nerves intact, reflexes equal bilaterally. Sensory-motor testing grossly intact. Tendon reflexes grossly intact.  Pysch: Alert & oriented x 3.  Insight and judgement nl & appropriate. No ideations.  Assessment and Plan:  1. Essential hypertension  - Continue medication, monitor blood pressure at home.  - Continue DASH diet.  Reminder to go to the ER if any CP,  SOB, nausea, dizziness, severe HA, changes vision/speech.  - CBC with Differential/Platelet - COMPLETE METABOLIC PANEL WITH GFR - Magnesium - TSH  2. Hyperlipidemia, mixed  - Continue diet/meds, exercise,& lifestyle modifications.  - Continue monitor periodic cholesterol/liver & renal functions   - Lipid panel - TSH  3. Abnormal glucose  - Continue diet, exercise, lifestyle modifications.  - Monitor appropriate labs.  - Hemoglobin A1c - Insulin, random  4. Vitamin D deficiency  - Continue supplementation.  - VITAMIN D 25 Hydroxyl  5. Prediabetes  - Hemoglobin A1c - Insulin, random  6. Gout  - Uric acid  7. Medication management   - CBC with Differential/Platelet - COMPLETE METABOLIC PANEL WITH GFR - Magnesium - Lipid panel - TSH - Hemoglobin A1c - Insulin, random - VITAMIN D 25 Hydroxyl - Uric acid      Discussed  regular exercise, BP monitoring, weight control to achieve/maintain BMI less than 25 and discussed med and SE's.  Recommended labs to assess and monitor clinical status with further disposition pending results of labs. Over 30 minutes of exam, counseling, chart review was performed.

## 2017-11-16 NOTE — Patient Instructions (Signed)

## 2017-11-17 LAB — COMPLETE METABOLIC PANEL WITH GFR
AG RATIO: 1.3 (calc) (ref 1.0–2.5)
ALT: 20 U/L (ref 9–46)
AST: 24 U/L (ref 10–35)
Albumin: 4.1 g/dL (ref 3.6–5.1)
Alkaline phosphatase (APISO): 57 U/L (ref 40–115)
BUN: 22 mg/dL (ref 7–25)
CALCIUM: 9.6 mg/dL (ref 8.6–10.3)
CO2: 27 mmol/L (ref 20–32)
Chloride: 100 mmol/L (ref 98–110)
Creat: 0.98 mg/dL (ref 0.70–1.18)
GFR, EST AFRICAN AMERICAN: 88 mL/min/{1.73_m2} (ref >=60)
GFR, EST NON AFRICAN AMERICAN: 76 mL/min/{1.73_m2} (ref >=60)
Globulin: 3.2 g/dL (calc) (ref 1.9–3.7)
Glucose, Bld: 86 mg/dL (ref 65–99)
POTASSIUM: 3.9 mmol/L (ref 3.5–5.3)
Sodium: 137 mmol/L (ref 135–146)
TOTAL PROTEIN: 7.3 g/dL (ref 6.1–8.1)
Total Bilirubin: 0.6 mg/dL (ref 0.2–1.2)

## 2017-11-17 LAB — CBC WITH DIFFERENTIAL/PLATELET
BASOS ABS: 49 {cells}/uL (ref 0–200)
BASOS PCT: 0.7 %
EOS ABS: 119 {cells}/uL (ref 15–500)
Eosinophils Relative: 1.7 %
HEMATOCRIT: 47.1 % (ref 38.5–50.0)
Hemoglobin: 16.7 g/dL (ref 13.2–17.1)
LYMPHS ABS: 1561 {cells}/uL (ref 850–3900)
MCH: 31.3 pg (ref 27.0–33.0)
MCHC: 35.5 g/dL (ref 32.0–36.0)
MCV: 88.4 fL (ref 80.0–100.0)
MPV: 10.6 fL (ref 7.5–12.5)
Monocytes Relative: 8.8 %
Neutro Abs: 4655 cells/uL (ref 1500–7800)
Neutrophils Relative %: 66.5 %
Platelets: 155 10*3/uL (ref 140–400)
RBC: 5.33 10*6/uL (ref 4.20–5.80)
RDW: 13.3 % (ref 11.0–15.0)
Total Lymphocyte: 22.3 %
WBC mixed population: 616 cells/uL (ref 200–950)
WBC: 7 10*3/uL (ref 3.8–10.8)

## 2017-11-17 LAB — LIPID PANEL
CHOLESTEROL: 225 mg/dL — AB (ref <200)
HDL: 37 mg/dL — AB (ref >40)
LDL Cholesterol (Calc): 142 mg/dL (calc) — ABNORMAL HIGH
NON-HDL CHOLESTEROL (CALC): 188 mg/dL — AB (ref <130)
TRIGLYCERIDES: 324 mg/dL — AB (ref <150)
Total CHOL/HDL Ratio: 6.1 (calc) — ABNORMAL HIGH (ref <5.0)

## 2017-11-17 LAB — MAGNESIUM: Magnesium: 1.9 mg/dL (ref 1.5–2.5)

## 2017-11-17 LAB — INSULIN, RANDOM: Insulin: 17.2 u[IU]/mL (ref 2.0–19.6)

## 2017-11-17 LAB — VITAMIN D 25 HYDROXY (VIT D DEFICIENCY, FRACTURES): VIT D 25 HYDROXY: 76 ng/mL (ref 30–100)

## 2017-11-17 LAB — TSH: TSH: 1.08 mIU/L (ref 0.40–4.50)

## 2017-11-17 LAB — HEMOGLOBIN A1C
HEMOGLOBIN A1C: 5.4 %{Hb} (ref <5.7)
Mean Plasma Glucose: 108 (calc)
eAG (mmol/L): 6 (calc)

## 2017-11-17 LAB — URIC ACID: Uric Acid, Serum: 8.6 mg/dL — ABNORMAL HIGH (ref 4.0–8.0)

## 2017-11-19 ENCOUNTER — Encounter: Payer: Self-pay | Admitting: Internal Medicine

## 2017-11-27 ENCOUNTER — Ambulatory Visit: Payer: Self-pay | Admitting: Surgery

## 2017-11-27 DIAGNOSIS — Z01818 Encounter for other preprocedural examination: Secondary | ICD-10-CM | POA: Diagnosis not present

## 2017-11-27 DIAGNOSIS — K432 Incisional hernia without obstruction or gangrene: Secondary | ICD-10-CM | POA: Diagnosis not present

## 2017-11-29 ENCOUNTER — Telehealth: Payer: Self-pay

## 2017-11-29 NOTE — Telephone Encounter (Signed)
   [facility] Pre-operative Risk Assessment    Request for surgical clearance:  1. What type of surgery is being performed? Incisional Hernia Repair   2. When is this surgery scheduled? TBD   3. What type of clearance is required (medical clearance vs. Pharmacy clearance to hold med vs. Both)? Pharmacy  4. Are there any medications that need to be held prior to surgery and how long? Plavix   5. Practice name and name of physician performing surgery? [facility] Dr Johney Maine   6. What is your office phone number [phone]    7.   What is your office fax number [phone]  8.   Anesthesia type (None, local, MAC, general) ? General   Legrand Como  Kiyaan Haq 11/29/2017, 4:40 PM  _________________________________________________________________   (provider comments below)

## 2017-11-30 NOTE — Telephone Encounter (Signed)
Dr. Meda Coffee, this pt is planned for hernia repair and they are requesting to hold Plavix. Last stent in 2012. If can hold Plavix, how long?  Please route response back to CV DIV PREOP  Thank you

## 2017-11-30 NOTE — Telephone Encounter (Signed)
   Primary Cardiologist: Ena Dawley, MD  Chart reviewed as part of pre-operative protocol coverage. Patient was contacted 11/30/2017 in reference to pre-operative risk assessment for pending surgery as outlined below.  [name] was last seen on 09/20/17 by Ermalinda Barrios PA.  Since that day, TERRI MALERBA has done well with no chest discomfort or shortness of breath or any new symptoms.  Therefore, based on ACC/AHA guidelines, the patient would be at acceptable risk for the planned procedure without further cardiovascular testing.   Per Dr. Meda Coffee Plavix can be held for 7 days prior.   I will route this recommendation to the requesting party via Epic fax function and remove from pre-op pool.  Please call with questions.  Daune Perch, NP 11/30/2017, 3:24 PM

## 2017-11-30 NOTE — Telephone Encounter (Signed)
Yes he can hold plavix 7 days prior

## 2017-12-11 ENCOUNTER — Other Ambulatory Visit: Payer: Self-pay | Admitting: Urology

## 2017-12-21 DIAGNOSIS — M79674 Pain in right toe(s): Secondary | ICD-10-CM | POA: Diagnosis not present

## 2017-12-21 DIAGNOSIS — L03031 Cellulitis of right toe: Secondary | ICD-10-CM | POA: Diagnosis not present

## 2017-12-21 DIAGNOSIS — M19071 Primary osteoarthritis, right ankle and foot: Secondary | ICD-10-CM | POA: Diagnosis not present

## 2017-12-26 ENCOUNTER — Ambulatory Visit: Payer: Medicare Other | Admitting: Sports Medicine

## 2018-01-04 ENCOUNTER — Other Ambulatory Visit: Payer: Self-pay | Admitting: *Deleted

## 2018-01-04 MED ORDER — GLUCOSE BLOOD VI STRP: ORAL_STRIP | 12 refills | Status: DC

## 2018-01-04 MED ORDER — FREESTYLE FREEDOM LITE W/DEVICE KIT: PACK | 0 refills | Status: AC

## 2018-01-05 DIAGNOSIS — M12271 Villonodular synovitis (pigmented), right ankle and foot: Secondary | ICD-10-CM | POA: Diagnosis not present

## 2018-01-05 DIAGNOSIS — M85471 Solitary bone cyst, right ankle and foot: Secondary | ICD-10-CM | POA: Diagnosis not present

## 2018-02-11 DIAGNOSIS — C649 Malignant neoplasm of unspecified kidney, except renal pelvis: Secondary | ICD-10-CM

## 2018-02-11 HISTORY — DX: Malignant neoplasm of unspecified kidney, except renal pelvis: C64.9

## 2018-02-13 DIAGNOSIS — H04123 Dry eye syndrome of bilateral lacrimal glands: Secondary | ICD-10-CM | POA: Diagnosis not present

## 2018-02-13 DIAGNOSIS — E119 Type 2 diabetes mellitus without complications: Secondary | ICD-10-CM | POA: Diagnosis not present

## 2018-02-13 DIAGNOSIS — Z961 Presence of intraocular lens: Secondary | ICD-10-CM | POA: Diagnosis not present

## 2018-02-13 DIAGNOSIS — H26492 Other secondary cataract, left eye: Secondary | ICD-10-CM | POA: Diagnosis not present

## 2018-02-13 LAB — HM DIABETES EYE EXAM

## 2018-02-14 NOTE — Progress Notes (Signed)
MEDICARE ANNUAL WELLNESS VISIT AND FOLLOW UP Assessment:   Medicare annual wellness visit  Atherosclerosis of aorta Control blood pressure, cholesterol, glucose, increase exercise.   Essential hypertension - continue medications, DASH diet, exercise and monitor at home. Call if greater than 130/80.  -     CBC with Differential/Platelet -     CMP/GFR -     TSH  Atherosclerosis of coronary artery bypass graft of native heart without angina pectoris Control blood pressure, cholesterol, glucose, increase exercise.   COPD mixed type ([place]) Controlled without symptoms  OSA and COPD overlap syndrome ([place]) ? Resolved, weight loss advised, continue to monitor Established with Dr. Brett Fairy  Rhinitis, nonallergic, chronic - Allegra OTC, increase H20, allergy hygiene explained.  PUD Continue PPI/H2 blocker, diet discussed  Gastroesophageal reflux disease, esophagitis presence not specified Continue PPI/H2 blocker, diet discussed  Other abnormal glucose Recent A1Cs at goal Discussed diet/exercise, weight management  Defer A1C; check CMP  Chronic cluster headache, not intractable Monitor, avoid triggers  BPH/Prostatism monitor  Hyperlipidemia -continue medications, check lipids, decrease fatty foods, increase activity.  -     Lipid panel  Obesity with co morbidities - long discussion about weight loss, diet, and exercise  Chronic midline low back pain with right-sided sciatica -     meloxicam (MOBIC) 15 MG tablet; Take one daily with food for 2 weeks, can take with tylenol, can not take with aleve, iburpofen, then as needed daily for pain - has history of PUD,  take mobic sparingly with H2 blocker and food, will follow up with ortho  Medication management -     Magnesium  Vitamin D deficiency Continue supplement  Depression Start wellbutrin 150 mg SR daily; risks/benefits discussed, information provided on AVS Follow up 12 weeks Lifestyle discussed: diet/exerise,  sleep hygiene, stress management, hydration   Over 30 minutes of exam, counseling, chart review, and critical decision making was performed  Future Appointments  Date Time Provider [place]  02/21/2018 10:30 AM [facility] PAT 2 [facility] None  05/24/2018  9:00 AM [name], MD [facility] None     Plan:   During the course of the visit the patient was educated and counseled about appropriate screening and preventive services including:    Pneumococcal vaccine   Influenza vaccine  Prevnar 13  Td vaccine  Screening electrocardiogram  Colorectal cancer screening  Diabetes screening  Glaucoma screening  Nutrition counseling    Subjective:  [name] is a 72 y.o. male who presents for Medicare Annual Wellness Visit and 3 month follow up for HTN, hyperlipidemia, prediabetes, and vitamin D Def. Follows with Dr. Assunta Curtis for injections for lower back arthritis. In 1992 , he had PCA/Stents locally by Dr Wynonia Lawman followed at [facility] for Laser angioplasty. Heart cath in 2012 found patent Stents.   He is followed by Dr. 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 Johney Maine upcoming and will be resecting mass at same time per patient.   Has history of OSA, NOT on CPAP, but no OSA per neuro on most recent reevaluation. Reports he is sleeping better at night. Taking 1/2 tab valium and sleep supplements.   BMI is Body mass index is 31.32 kg/m., he has been working on diet and exercise. Wt Readings from Last 3 Encounters:  02/16/18 200 lb (90.7 kg)  11/16/17 202 lb 12.8 oz (92 kg)  11/03/17 204 lb (92.5 kg)   His blood pressure has  been controlled at home, today their BP is BP: 128/76 He does workout. He denies chest pain, shortness of breath, dizziness.    He is not on cholesterol medication due to hx of reacting very poorly to multiple agents and was hospitalized. His cholesterol is not  at goal. The cholesterol last visit was:   Lab Results  Component Value Date   CHOL 225 (H) 11/16/2017   HDL 37 (L) 11/16/2017   LDLCALC 142 (H) 11/16/2017   LDLDIRECT 116.0 06/03/2013   TRIG 324 (H) 11/16/2017   CHOLHDL 6.1 (H) 11/16/2017    He was in DM range several years ago, but with diet is back to normal range.  Lab Results  Component Value Date   HGBA1C 5.4 11/16/2017   Last GFR Lab Results  Component Value Date   GFRNONAA 76 11/16/2017    Patient is on Vitamin D supplement.   Lab Results  Component Value Date   VD25OH 76 11/16/2017       Medication Review: Current Outpatient Medications on File Prior to Visit  Medication Sig Dispense Refill  . acetaminophen (TYLENOL) 500 MG tablet Take 1 tablet (500 mg total) by mouth every 6 (six) hours as needed. (Patient taking differently: Take 500-1,000 mg by mouth every 6 (six) hours as needed (for pain.). ) 30 tablet 0  . ALPHA-LIPOIC ACID PO Take 1 capsule by mouth 2 (two) times daily.    . Ascorbic Acid (VITAMIN C) 1000 MG tablet Take 1,000 mg by mouth 2 (two) times daily.     Marland Kitchen aspirin EC 81 MG tablet Take 81 mg by mouth every evening.     . B Complex Vitamins (B COMPLEX PO) Take 1 capsule by mouth daily.     . bisoprolol-hydrochlorothiazide (ZIAC) 10-6.25 MG tablet Take 1 tablet by mouth daily. 90 tablet 3  . Blood Glucose Monitoring Suppl (FREESTYLE FREEDOM LITE) w/Device KIT Check blood sugar 1 time a day. DX-E11.21 1 each 0  . Choline Dihydrogen Citrate (CHOLINE CITRATE) 650 MG TABS Take 650 mg by mouth daily.     . clopidogrel (PLAVIX) 75 MG tablet Take 1 tablet (75 mg total) by mouth daily. 90 tablet 1  . diazepam (VALIUM) 5 MG tablet Take 1/2-1 tablet 2 - 3 x /day ONLY if needed for Anxiety Attack &  limit to 5 days /week to avoid addiction 90 tablet 0  . finasteride (PROSCAR) 5 MG tablet Take 5 mg by mouth daily.  6  . fish oil-omega-3 fatty acids 1000 MG capsule Take 1 g by mouth 2 (two) times daily.     .  Flaxseed, Linseed, (FLAX SEED OIL) 1000 MG CAPS Take 1,000 mg by mouth 2 (two) times daily.     . folic acid (FOLVITE) 030 MCG tablet Take 800 mcg by mouth at bedtime.     Marland Kitchen glucose blood (FREESTYLE LITE) test strip Check blood sugar 1 time a day.  DX-E11.21 100 each 12  . ipratropium (ATROVENT) 0.03 % nasal spray Place 1-2 sprays into both nostrils daily as needed (for allergies.).     Marland Kitchen Lecithin 1200 MG CAPS Take 1,200 mg by mouth 2 (two) times daily.    . Magnesium 500 MG TABS Take 1,500 mg by mouth at bedtime.     . Melatonin 10 MG TABS Take 30 mg by mouth at bedtime.    . meloxicam (MOBIC) 15 MG tablet Take 1/2 to 1 tablet daily with food for pain & inflammation - limit to 4 tablets /  week to prevent Kidney Damage 90 tablet 0  . Multiple Vitamin (MULTIVITAMIN WITH MINERALS) TABS tablet Take 1 tablet by mouth daily.    . niacin 500 MG tablet Take 500 mg by mouth 2 (two) times daily.    . nitroGLYCERIN (NITROSTAT) 0.4 MG SL tablet Place 0.4 mg under the tongue every 5 (five) minutes x 3 doses as needed for chest pain.    . tamsulosin (FLOMAX) 0.4 MG CAPS capsule Take 1 capsule daily for Prostate 90 capsule 1  . tetrahydrozoline-zinc (VISINE-AC) 0.05-0.25 % ophthalmic solution Place 1-2 drops into both eyes 3 (three) times daily as needed (for redness/irritation.).    Marland Kitchen Turmeric 500 MG CAPS Take 1,500 mg by mouth 2 (two) times daily.     Marland Kitchen VALERIAN PO Take 1,200 mg by mouth at bedtime. 400 mg tablets     No current facility-administered medications on file prior to visit.     Allergies: Allergies  Allergen Reactions  . Codeine Itching and Other (See Comments)    Also hallucinations   . Morphine And Related Other (See Comments)    Hallucinations  . Nsaids Other (See Comments)    BLEEDING--naprosyn  . Oxycodone     Hallucinations  . Crestor [Rosuvastatin] Other (See Comments)    Soreness/aching  . Cymbalta [Duloxetine Hcl] Other (See Comments)    Pt is unsure of reaction type  .  Dilaudid [Hydromorphone Hcl] Itching and Other (See Comments)    Hallucinations.  . Effexor [Venlafaxine] Other (See Comments)    Unsure of reactions type  . Gluten Meal Other (See Comments)    Runny nose (constant); bloating.  . Topamax [Topiramate] Other (See Comments)    Caused visual impairments/swelling in eyes--pinch off optic nerve (temporary blindness)  . Trazodone And Nefazodone Other (See Comments)    Unsure of reaction type  . Adhesive [Tape] Other (See Comments)    SKIN PEELS--PAPER TAPE IS OKAY  . Penicillins Rash  . Sulfa Antibiotics Swelling, Rash and Other (See Comments)    Mouth sores    Current Problems (verified) has Hyperlipidemia; Cluster headache syndrome; Essential hypertension; Coronary atherosclerosis; COPD mixed type ([place]); GERD; Lumbago, Chronic; PUD; BPH/Prostatism; Vitamin D deficiency; Medication management; Rhinitis, nonallergic, chronic; Obesity (BMI 30.0-34.9); Encounter for Medicare annual wellness exam; OSA and COPD overlap syndrome ([place]); Aortic atherosclerosis ([place]); Anxiety; and Other abnormal glucose on their problem list.  Screening Tests Immunization History  Administered Date(s) Administered  . DT 12/09/2014  . Influenza Split 02/17/2012  . Influenza, High Dose Seasonal PF 03/03/2014, 03/11/2015, 04/04/2016, 04/28/2017  . Pneumococcal Conjugate-13 12/25/2014  . Pneumococcal Polysaccharide-23 01/19/2012  . Pneumococcal-Unspecified 06/13/1997  . Td 06/13/2004  . Zoster 06/14/2007   Preventative care: Last colonoscopy: 03/2016  Prior vaccinations: TD or Tdap: 6/16 Influenza: 2018 Pneumococcal: 8/13 Prevnar13: 2016 Shingles/Zostavax: 2009  Names of Other Physician/Practitioners you currently use: 1. [facility] here for primary care 2. Dr. Katy Fitch, eye doctor, last visit 02/13/2018 - need report 3. 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, community, last visit 2019  Patient Care Team: [name], MD as PCP - General  (Internal Medicine) Dorothy Spark, MD as PCP - Cardiology (Cardiology) Kathie Rhodes, MD as Consulting Physician (Urology) Warden Fillers, MD as Consulting Physician (Optometry) Larey Dresser, MD as Consulting Physician (Cardiology) Laurence Spates, MD as Consulting Physician (Gastroenterology) Deneise Lever, MD as Consulting Physician (Pulmonary Disease) Dorothy Spark, MD as Consulting Physician (Cardiology) Michael Boston, MD as Consulting Physician (General Surgery) Ardis Hughs, MD as  Attending Physician (Urology) Laurence Spates, MD as Consulting Physician (Gastroenterology)  Surgical: He  has a past surgical history that includes Hiatal hernia repair; right temporal artery biopsy; C5-T6 posterior fusion; right carpal tunnel release; Esophageal manometry; Laparoscopic cholecystectomy; Umbilical hernia repair; multiple percutaneous coronary interventions; Back surgery; cataracts; blepheroplasty; Nasal sinus surgery; Hardware Removal (02/20/2012); Rotator cuff surgery (Right); Cardiac catheterization; Cervical spine surgery; Colonoscopy; Upper gi endoscopy; Skin biopsy (Right, 10/31/2017); and Transurethral resection of bladder tumor (N/A, 11/03/2017). Family His family history includes Coronary artery disease in his father; Gout in his sister; Heart attack in his brother, brother, father, and mother; Heart failure in his mother; Hypertension in his father and mother; Obesity in his mother. Social history  He reports that he quit smoking about 40 years ago. He has never used smokeless tobacco. He reports that he drank alcohol. He reports that he does not use drugs.  MEDICARE WELLNESS OBJECTIVES: Physical activity:   Cardiac risk factors:   Depression/mood screen:   Depression screen [facility] 2/9 02/16/2018  Decreased Interest 3  Down, Depressed, Hopeless 0  PHQ - 2 Score 3  Altered sleeping 1  Tired, decreased energy 3  Change in appetite 1  Feeling bad or failure about  yourself  0  Trouble concentrating 1  Moving slowly or fidgety/restless 0  Suicidal thoughts 0  PHQ-9 Score 9  Difficult doing work/chores Somewhat difficult    ADLs:  In your present state of health, do you have any difficulty performing the following activities: 11/19/2017 10/31/2017  Hearing? N N  Vision? N N  Difficulty concentrating or making decisions? N N  Walking or climbing stairs? N N  Dressing or bathing? N N  Doing errands, shopping? N N  Some recent data might be hidden     Cognitive Testing  Alert? Yes  Normal Appearance?Yes  Oriented to person? Yes  Place? Yes   Time? Yes  Recall of three objects?  Yes  Can perform simple calculations? Yes  Displays appropriate judgment?Yes  Can read the correct time from a watch face?Yes  EOL planning: Does Patient Have a Medical Advance Directive?: Yes Type of Advance Directive: Healthcare Power of Attorney, Living will Does patient want to make changes to medical advance directive?: No - Patient declined Copy of [place] in Chart?: No - copy requested   Objective:   Today's Vitals   02/16/18 1104  BP: 128/76  Pulse: (!) 58  Temp: 97.7 F (36.5 C)  SpO2: 97%  Weight: 200 lb (90.7 kg)  Height: 5' 7"  (1.702 m)  PainSc: 2   PainLoc: Back   Body mass index is 31.32 kg/m.  General appearance: alert, no distress, WD/WN, male HEENT: normocephalic, sclerae anicteric, TMs pearly, nares patent, no discharge or erythema, pharynx normal Oral cavity: MMM, no lesions Neck: supple, no lymphadenopathy, no thyromegaly, no masses Heart: RRR, normal S1, S2, no murmurs Lungs: CTA bilaterally, no wheezes, rhonchi, or rales Abdomen: +bs, soft, non tender, non distended, no masses, no hepatomegaly, no splenomegaly Musculoskeletal: nontender, no swelling, no obvious deformity Extremities: no edema, no cyanosis, no clubbing Pulses: 2+ symmetric, upper and lower extremities, normal cap refill Neurological: alert,  oriented x 3, CN2-12 intact, strength normal upper extremities and lower extremities, sensation normal throughout, DTRs 2+ throughout, no cerebellar signs, gait normal Psychiatric: normal affect, behavior normal, pleasant   Medicare Attestation I have personally reviewed: The patient's medical and social history Their use of alcohol, tobacco or illicit drugs Their current medications and supplements  The patient's functional ability including ADLs,fall risks, home safety risks, cognitive, and hearing and visual impairment Diet and physical activities Evidence for depression or mood disorders  The patient's weight, height, BMI, and visual acuity have been recorded in the chart.  I have made referrals, counseling, and provided education to the patient based on review of the above and I have provided the patient with a written personalized care plan for preventive services.     Izora Ribas, NP   02/16/2018

## 2018-02-15 ENCOUNTER — Other Ambulatory Visit: Payer: Self-pay | Admitting: *Deleted

## 2018-02-15 ENCOUNTER — Other Ambulatory Visit: Payer: Self-pay | Admitting: Internal Medicine

## 2018-02-15 DIAGNOSIS — R7309 Other abnormal glucose: Secondary | ICD-10-CM | POA: Insufficient documentation

## 2018-02-15 MED ORDER — MELOXICAM 15 MG PO TABS: ORAL_TABLET | ORAL | 0 refills | Status: DC

## 2018-02-15 MED ORDER — CLOPIDOGREL BISULFATE 75 MG PO TABS: 75.0000 mg | ORAL_TABLET | Freq: Every day | ORAL | 1 refills | Status: DC

## 2018-02-15 MED ORDER — DIAZEPAM 5 MG PO TABS: ORAL_TABLET | ORAL | 0 refills | Status: DC

## 2018-02-15 MED ORDER — BISOPROLOL-HYDROCHLOROTHIAZIDE 10-6.25 MG PO TABS: 1.0000 | ORAL_TABLET | Freq: Every day | ORAL | 3 refills | Status: DC

## 2018-02-15 MED ORDER — TAMSULOSIN HCL 0.4 MG PO CAPS: ORAL_CAPSULE | ORAL | 1 refills | Status: DC

## 2018-02-16 ENCOUNTER — Ambulatory Visit (INDEPENDENT_AMBULATORY_CARE_PROVIDER_SITE_OTHER): Payer: Medicare Other | Admitting: Adult Health

## 2018-02-16 ENCOUNTER — Encounter: Payer: Self-pay | Admitting: Adult Health

## 2018-02-16 ENCOUNTER — Encounter (HOSPITAL_COMMUNITY): Payer: Self-pay

## 2018-02-16 VITALS — BP 128/76 | HR 58 | Temp 97.7°F | Ht 67.0 in | Wt 200.0 lb

## 2018-02-16 DIAGNOSIS — I10 Essential (primary) hypertension: Secondary | ICD-10-CM | POA: Diagnosis not present

## 2018-02-16 DIAGNOSIS — E66811 Obesity, class 1: Secondary | ICD-10-CM

## 2018-02-16 DIAGNOSIS — E559 Vitamin D deficiency, unspecified: Secondary | ICD-10-CM | POA: Diagnosis not present

## 2018-02-16 DIAGNOSIS — K279 Peptic ulcer, site unspecified, unspecified as acute or chronic, without hemorrhage or perforation: Secondary | ICD-10-CM | POA: Diagnosis not present

## 2018-02-16 DIAGNOSIS — Z79899 Other long term (current) drug therapy: Secondary | ICD-10-CM

## 2018-02-16 DIAGNOSIS — M5441 Lumbago with sciatica, right side: Secondary | ICD-10-CM

## 2018-02-16 DIAGNOSIS — K219 Gastro-esophageal reflux disease without esophagitis: Secondary | ICD-10-CM

## 2018-02-16 DIAGNOSIS — G8929 Other chronic pain: Secondary | ICD-10-CM

## 2018-02-16 DIAGNOSIS — Z0001 Encounter for general adult medical examination with abnormal findings: Secondary | ICD-10-CM | POA: Diagnosis not present

## 2018-02-16 DIAGNOSIS — G4733 Obstructive sleep apnea (adult) (pediatric): Secondary | ICD-10-CM

## 2018-02-16 DIAGNOSIS — I700 Atherosclerosis of aorta: Secondary | ICD-10-CM | POA: Diagnosis not present

## 2018-02-16 DIAGNOSIS — E782 Mixed hyperlipidemia: Secondary | ICD-10-CM

## 2018-02-16 DIAGNOSIS — N320 Bladder-neck obstruction: Secondary | ICD-10-CM | POA: Diagnosis not present

## 2018-02-16 DIAGNOSIS — R6889 Other general symptoms and signs: Secondary | ICD-10-CM | POA: Diagnosis not present

## 2018-02-16 DIAGNOSIS — G44029 Chronic cluster headache, not intractable: Secondary | ICD-10-CM

## 2018-02-16 DIAGNOSIS — J449 Chronic obstructive pulmonary disease, unspecified: Secondary | ICD-10-CM

## 2018-02-16 DIAGNOSIS — E669 Obesity, unspecified: Secondary | ICD-10-CM | POA: Diagnosis not present

## 2018-02-16 DIAGNOSIS — R7309 Other abnormal glucose: Secondary | ICD-10-CM

## 2018-02-16 DIAGNOSIS — I2510 Atherosclerotic heart disease of native coronary artery without angina pectoris: Secondary | ICD-10-CM | POA: Diagnosis not present

## 2018-02-16 DIAGNOSIS — Z0000 Encounter for general adult medical examination without abnormal findings: Secondary | ICD-10-CM

## 2018-02-16 DIAGNOSIS — J310 Chronic rhinitis: Secondary | ICD-10-CM | POA: Diagnosis not present

## 2018-02-16 DIAGNOSIS — F320 Major depressive disorder, single episode, mild: Secondary | ICD-10-CM

## 2018-02-16 DIAGNOSIS — F419 Anxiety disorder, unspecified: Secondary | ICD-10-CM

## 2018-02-16 LAB — COMPLETE METABOLIC PANEL WITH GFR
AG Ratio: 1.3 (calc) (ref 1.0–2.5)
ALBUMIN MSPROF: 4.2 g/dL (ref 3.6–5.1)
ALKALINE PHOSPHATASE (APISO): 60 U/L (ref 40–115)
ALT: 17 U/L (ref 9–46)
AST: 23 U/L (ref 10–35)
BUN / CREAT RATIO: 24 (calc) — AB (ref 6–22)
BUN: 27 mg/dL — AB (ref 7–25)
CO2: 27 mmol/L (ref 20–32)
Calcium: 9.5 mg/dL (ref 8.6–10.3)
Chloride: 98 mmol/L (ref 98–110)
Creat: 1.14 mg/dL (ref 0.70–1.18)
GFR, EST NON AFRICAN AMERICAN: 63 mL/min/{1.73_m2} (ref >=60)
GFR, Est African American: 73 mL/min/{1.73_m2} (ref >=60)
GLOBULIN: 3.2 g/dL (ref 1.9–3.7)
GLUCOSE: 89 mg/dL (ref 65–99)
Potassium: 4.6 mmol/L (ref 3.5–5.3)
SODIUM: 135 mmol/L (ref 135–146)
Total Bilirubin: 0.5 mg/dL (ref 0.2–1.2)
Total Protein: 7.4 g/dL (ref 6.1–8.1)

## 2018-02-16 LAB — LIPID PANEL
Cholesterol: 215 mg/dL — ABNORMAL HIGH (ref <200)
HDL: 35 mg/dL — ABNORMAL LOW (ref >40)
Non-HDL Cholesterol (Calc): 180 mg/dL (calc) — ABNORMAL HIGH (ref <130)
TRIGLYCERIDES: 579 mg/dL — AB (ref <150)
Total CHOL/HDL Ratio: 6.1 (calc) — ABNORMAL HIGH (ref <5.0)

## 2018-02-16 LAB — CBC WITH DIFFERENTIAL/PLATELET
BASOS ABS: 51 {cells}/uL (ref 0–200)
BASOS PCT: 0.7 %
EOS PCT: 1.4 %
Eosinophils Absolute: 102 cells/uL (ref 15–500)
HEMATOCRIT: 49.6 % (ref 38.5–50.0)
HEMOGLOBIN: 17.2 g/dL — AB (ref 13.2–17.1)
LYMPHS ABS: 1920 {cells}/uL (ref 850–3900)
MCH: 31.2 pg (ref 27.0–33.0)
MCHC: 34.7 g/dL (ref 32.0–36.0)
MCV: 89.9 fL (ref 80.0–100.0)
MONOS PCT: 8.1 %
MPV: 10.4 fL (ref 7.5–12.5)
NEUTROS ABS: 4636 {cells}/uL (ref 1500–7800)
Neutrophils Relative %: 63.5 %
Platelets: 170 10*3/uL (ref 140–400)
RBC: 5.52 10*6/uL (ref 4.20–5.80)
RDW: 13.1 % (ref 11.0–15.0)
Total Lymphocyte: 26.3 %
WBC mixed population: 591 cells/uL (ref 200–950)
WBC: 7.3 10*3/uL (ref 3.8–10.8)

## 2018-02-16 LAB — TSH: TSH: 1.45 m[IU]/L (ref 0.40–4.50)

## 2018-02-16 LAB — MAGNESIUM: MAGNESIUM: 2 mg/dL (ref 1.5–2.5)

## 2018-02-16 MED ORDER — BUPROPION HCL ER (XL) 150 MG PO TB24: 150.0000 mg | ORAL_TABLET | Freq: Every day | ORAL | 1 refills | Status: DC

## 2018-02-16 NOTE — Patient Instructions (Addendum)
[place]  02/16/2018   Your procedure is scheduled on: 03-02-18  Report to [facility] Main  Entrance  Report to admitting at 530  AM    Call this number if you have problems the morning of surgery [phone]   Remember: Do not eat food :After Midnight.  NO SOLID FOOD AFTER MIDNIGHT THE NIGHT PRIOR TO SURGERY. NOTHING BY MOUTH EXCEPT CLEAR LIQUIDS UNTIL 3 HOURS PRIOR TO [place] SURGERY. PLEASE FINISH ENSURE DRINK PER SURGEON ORDER 3 HOURS PRIOR TO SCHEDULED SURGERY TIME WHICH NEEDS TO BE COMPLETED AT 415 AM.   CLEAR LIQUID DIET   Foods Allowed                                                                     Foods Excluded  Coffee and tea, regular and decaf                             liquids that you cannot  Plain Jell-O in any flavor                                             see through such as: Fruit ices (not with fruit pulp)                                     milk, soups, orange juice  Iced Popsicles                                    All solid food Carbonated beverages, regular and diet                                    Cranberry, grape and apple juices Sports drinks like Gatorade Lightly seasoned clear broth or consume(fat free) Sugar, honey syrup  Sample Menu Breakfast                                Lunch                                     Supper Cranberry juice                    Beef broth                            Chicken broth Jell-O                                     Grape juice  Apple juice Coffee or tea                        Jell-O                                      Popsicle                                                Coffee or tea                        Coffee or tea  _____________________________________________________________________    Take these medicines the morning of surgery with A SIP OF WATER: valium if needed, finasteride (proscar), tamsulosin (flomax)              You may not  have any metal on your body including hair pins and              piercings  Do not wear jewelry, make-up, lotions, powders or perfumes, deodorant             Do not wear nail polish.  Do not shave  48 hours prior to surgery.              Men may shave face and neck.   Do not bring valuables to the hospital. [place].  Contacts, dentures or bridgework may not be worn into surgery.  Leave suitcase in the car. After surgery it may be brought to your room.                  Please read over the following fact sheets you were given: _____________________________________________________________________  [facility] - Preparing for Surgery Before surgery, you can play an important role.  Because skin is not sterile, your skin needs to be as free of germs as possible.  You can reduce the number of germs on your skin by washing with CHG (chlorahexidine gluconate) soap before surgery.  CHG is an antiseptic cleaner which kills germs and bonds with the skin to continue killing germs even after washing. Please DO NOT use if you have an allergy to CHG or antibacterial soaps.  If your skin becomes reddened/irritated stop using the CHG and inform your nurse when you arrive at Short Stay. Do not shave (including legs and underarms) for at least 48 hours prior to the first CHG shower.  You may shave your face/neck. Please follow these instructions carefully:  1.  Shower with CHG Soap the night before surgery and the  morning of Surgery.  2.  If you choose to wash your hair, wash your hair first as usual with your  normal  shampoo.  3.  After you shampoo, rinse your hair and body thoroughly to remove the  shampoo.                           4.  Use CHG as you would any other liquid soap.  You can apply chg directly  to the skin and wash  Gently with a scrungie or clean washcloth.  5.  Apply the CHG Soap to your body ONLY FROM THE NECK DOWN.    Do not use on face/ open                           Wound or open sores. Avoid contact with eyes, ears mouth and genitals (private parts).                       Wash face,  Genitals (private parts) with your normal soap.             6.  Wash thoroughly, paying special attention to the area where your surgery  will be performed.  7.  Thoroughly rinse your body with warm water from the neck down.  8.  DO NOT shower/wash with your normal soap after using and rinsing off  the CHG Soap.                9.  Pat yourself dry with a clean towel.            10.  Wear clean pajamas.            11.  Place clean sheets on your bed the night of your first shower and do not  sleep with pets. Day of Surgery : Do not apply any lotions/deodorants the morning of surgery.  Please wear clean clothes to the hospital/surgery center.  FAILURE TO FOLLOW THESE INSTRUCTIONS MAY RESULT IN THE CANCELLATION OF YOUR SURGERY PATIENT SIGNATURE_________________________________  NURSE SIGNATURE__________________________________  ________________________________________________________________________             WHAT IS A BLOOD TRANSFUSION? Blood Transfusion Information  A transfusion is the replacement of blood or some of its parts. Blood is made up of multiple cells which provide different functions.  Red blood cells carry oxygen and are used for blood loss replacement.  White blood cells fight against infection.  Platelets control bleeding.  Plasma helps clot blood.  Other blood products are available for specialized needs, such as hemophilia or other clotting disorders. BEFORE THE TRANSFUSION  Who gives blood for transfusions?   Healthy volunteers who are fully evaluated to make sure their blood is safe. This is blood bank blood. Transfusion therapy is the safest it has ever been in the practice of medicine. Before blood is taken from a donor, a complete history is taken to make sure that person has no  history of diseases nor engages in risky social behavior (examples are intravenous drug use or sexual activity with multiple partners). The donor's travel history is screened to minimize risk of transmitting infections, such as malaria. The donated blood is tested for signs of infectious diseases, such as HIV and hepatitis. The blood is then tested to be sure it is compatible with you in order to minimize the chance of a transfusion reaction. If you or a relative donates blood, this is often done in anticipation of surgery and is not appropriate for emergency situations. It takes many days to process the donated blood. RISKS AND COMPLICATIONS Although transfusion therapy is very safe and saves many lives, the main dangers of transfusion include:   Getting an infectious disease.  Developing a transfusion reaction. This is an allergic reaction to something in the blood you were given. Every precaution is taken to prevent this. The decision to have a blood transfusion has been considered carefully by your caregiver  before blood is given. Blood is not given unless the benefits outweigh the risks. AFTER THE TRANSFUSION  Right after receiving a blood transfusion, you will usually feel much better and more energetic. This is especially true if your red blood cells have gotten low (anemic). The transfusion raises the level of the red blood cells which carry oxygen, and this usually causes an energy increase.  The nurse administering the transfusion will monitor you carefully for complications. HOME CARE INSTRUCTIONS  No special instructions are needed after a transfusion. You may find your energy is better. Speak with your caregiver about any limitations on activity for underlying diseases you may have. SEEK MEDICAL CARE IF:   Your condition is not improving after your transfusion.  You develop redness or irritation at the intravenous (IV) site. SEEK IMMEDIATE MEDICAL CARE IF:  Any of the following  symptoms occur over the next 12 hours:  Shaking chills.  You have a temperature by mouth above 102 F (38.9 C), not controlled by medicine.  Chest, back, or muscle pain.  People around you feel you are not acting correctly or are confused.  Shortness of breath or difficulty breathing.  Dizziness and fainting.  You get a rash or develop hives.  You have a decrease in urine output.  Your urine turns a dark color or changes to pink, red, or brown. Any of the following symptoms occur over the next 10 days:  You have a temperature by mouth above 102 F (38.9 C), not controlled by medicine.  Shortness of breath.  Weakness after normal activity.  The white part of the eye turns yellow (jaundice).  You have a decrease in the amount of urine or are urinating less often.  Your urine turns a dark color or changes to pink, red, or brown. Document Released: 05/27/2000 Document Revised: 08/22/2011 Document Reviewed: 01/14/2008 ExitCare Patient Information 2014 Exit[employer], [place].  _______________________________________________________________________ WHAT IS A BLOOD TRANSFUSION? Blood Transfusion Information  A transfusion is the replacement of blood or some of its parts. Blood is made up of multiple cells which provide different functions.  Red blood cells carry oxygen and are used for blood loss replacement.  White blood cells fight against infection.  Platelets control bleeding.  Plasma helps clot blood.  Other blood products are available for specialized needs, such as hemophilia or other clotting disorders. BEFORE THE TRANSFUSION  Who gives blood for transfusions?   Healthy volunteers who are fully evaluated to make sure their blood is safe. This is blood bank blood. Transfusion therapy is the safest it has ever been in the practice of medicine. Before blood is taken from a donor, a complete history is taken to make sure that person has no history of diseases nor engages  in risky social behavior (examples are intravenous drug use or sexual activity with multiple partners). The donor's travel history is screened to minimize risk of transmitting infections, such as malaria. The donated blood is tested for signs of infectious diseases, such as HIV and hepatitis. The blood is then tested to be sure it is compatible with you in order to minimize the chance of a transfusion reaction. If you or a relative donates blood, this is often done in anticipation of surgery and is not appropriate for emergency situations. It takes many days to process the donated blood. RISKS AND COMPLICATIONS Although transfusion therapy is very safe and saves many lives, the main dangers of transfusion include:   Getting an infectious disease.  Developing a transfusion reaction.  This is an allergic reaction to something in the blood you were given. Every precaution is taken to prevent this. The decision to have a blood transfusion has been considered carefully by your caregiver before blood is given. Blood is not given unless the benefits outweigh the risks. AFTER THE TRANSFUSION  Right after receiving a blood transfusion, you will usually feel much better and more energetic. This is especially true if your red blood cells have gotten low (anemic). The transfusion raises the level of the red blood cells which carry oxygen, and this usually causes an energy increase.  The nurse administering the transfusion will monitor you carefully for complications. HOME CARE INSTRUCTIONS  No special instructions are needed after a transfusion. You may find your energy is better. Speak with your caregiver about any limitations on activity for underlying diseases you may have. SEEK MEDICAL CARE IF:   Your condition is not improving after your transfusion.  You develop redness or irritation at the intravenous (IV) site. SEEK IMMEDIATE MEDICAL CARE IF:  Any of the following symptoms occur over the next 12  hours:  Shaking chills.  You have a temperature by mouth above 102 F (38.9 C), not controlled by medicine.  Chest, back, or muscle pain.  People around you feel you are not acting correctly or are confused.  Shortness of breath or difficulty breathing.  Dizziness and fainting.  You get a rash or develop hives.  You have a decrease in urine output.  Your urine turns a dark color or changes to pink, red, or brown. Any of the following symptoms occur over the next 10 days:  You have a temperature by mouth above 102 F (38.9 C), not controlled by medicine.  Shortness of breath.  Weakness after normal activity.  The white part of the eye turns yellow (jaundice).  You have a decrease in the amount of urine or are urinating less often.  Your urine turns a dark color or changes to pink, red, or brown. Document Released: 05/27/2000 Document Revised: 08/22/2011 Document Reviewed: 01/14/2008 [facility] Patient Information 2014 [place], [place].  _______________________________________________________________________

## 2018-02-16 NOTE — Progress Notes (Signed)
Cardiac clearance [name] no 11-29-17 epic ekg 11-161-8 epic

## 2018-02-16 NOTE — Patient Instructions (Addendum)
[name] , Thank you for taking time to come for your Medicare Wellness Visit. I appreciate your ongoing commitment to your health goals. Please review the following plan we discussed and let me know if I can assist you in the future.   These are the goals we discussed: Goals    . Weight (lb) < 180 lb (81.6 kg)       This is a list of the screening recommended for you and due dates:  Health Maintenance  Topic Date Due  . Flu Shot  02/23/2018*  . Tetanus Vaccine  12/08/2024  . Colon Cancer Screening  04/11/2026  .  Hepatitis C: One time screening is recommended by Center for Disease Control  (CDC) for  adults born from [birth year] through 1967.   Completed  . Pneumonia vaccines  Completed  *Topic was postponed. The date shown is not the original due date.    Recommend making a list of things you need to get done, checking daily and crossing off items  Follow a regular/daily routine - try to be consistent in the times that you wake up, go to sleep, exercise, etc   Aim for 7+ servings of fruits and vegetables daily  65-80+ fluid ounces of water or unsweet tea for healthy kidneys  Limit to max 1 drink of alcohol per day; avoid smoking/tobacco  Limit animal fats in diet for cholesterol and heart health - choose grass fed whenever available  Avoid highly processed foods, and foods high in saturated/trans fats  Aim for low stress - take time to unwind and care for your mental health  Aim for 150 min of moderate intensity exercise weekly for heart health, and weights twice weekly for bone health  Aim for 7-9 hours of sleep daily   Intermittent fasting is more about strategy than starvation. It's meant to reset your body in different ways, hopefully with fitness and nutrition changes as a result.  Like any big switchover, though, results may vary when it comes down to the individual level. What works for your friends may not work for you, or vice versa. That's why it's helpful to  play around with variations on intermittent fasting and healthy habits and find what works best for you.  WHAT IS INTERMITTENT FASTING AND WHY DO IT?  Intermittent fasting doesn't involve specific foods, but rather, a strict schedule regarding when you eat. Also called "time-restricted eating," the tactic has been praised for its contribution to weight loss, improved body composition, and decreased cravings. Preliminary research also suggests it may be beneficial for glucose tolerance, hormone regulation, better muscle mass and lower body fat.  Part of its appeal is the simplicity of the effort. Unlike some other trends, there's no calculations to intermittent fasting.  You simply eat within a certain block of time, usually a window of 8-10 hours. In the other big block of time - about 14-16 hours, including when you're asleep - you don't eat anything, not even snacks. You can drink water, coffee, tea or any other beverage that doesn't have calories.  For example, if you like having a late dinner, you might skip breakfast and have your first meal at noon and your last meal of the day at 8 p.m., and then not eat until noon again the next day.  IDEAS FOR GETTING STARTED  If you're new to the strategy, it may be helpful to eat within the typical circadian rhythm and keep eating within daylight hours. This can be especially beneficial  if you're looking at intermittent fasting for weight-loss goals.  So first try only eating between 12pm to 8pm.  Outside of this time you may have water, black coffee, and hot tea. You may not eat it drink anything that has carbs, sugars, OR artificial sugars like diet soda.   Like any major eating and fitness shift, it can take time to find the perfect fit, so don't be afraid to experiment with different options - including ditching intermittent fasting altogether if it's simply not for you. But if it is, you may be surprised by some of the benefits that come along with  the strategy.    Bupropion sustained-release tablets (Depression/Mood Disorders) What is this medicine? BUPROPION (byoo PROE pee on) is used to treat depression. This medicine may be used for other purposes; ask your health care provider or pharmacist if you have questions. COMMON BRAND NAME(S): Budeprion SR, Wellbutrin SR What should I tell my health care provider before I take this medicine? They need to know if you have any of these conditions: -an eating disorder, such as anorexia or bulimia -bipolar disorder or psychosis -diabetes or high blood sugar, treated with medication -glaucoma -head injury or brain tumor -heart disease, previous heart attack, or irregular heart beat -high blood pressure -kidney or liver disease -seizures -suicidal thoughts or a previous suicide attempt -Tourette's syndrome -weight loss -an unusual or allergic reaction to bupropion, other medicines, foods, dyes, or preservatives -breast-feeding -pregnant or trying to become pregnant How should I use this medicine? Take this medicine by mouth with a glass of water. Follow the directions on the prescription label. You can take it with or without food. If it upsets your stomach, take it with food. Do not cut, crush or chew this medicine. Take your medicine at regular intervals. If you take this medicine more than once a day, take your second dose at least 8 hours after you take your first dose. To limit difficulty in sleeping, avoid taking this medicine at bedtime. Do not take your medicine more often than directed. Do not stop taking this medicine suddenly except upon the advice of your doctor. Stopping this medicine too quickly may cause serious side effects or your condition may worsen. A special MedGuide will be given to you by the pharmacist with each prescription and refill. Be sure to read this information carefully each time. Talk to your pediatrician regarding the use of this medicine in children.  Special care may be needed. Overdosage: If you think you have taken too much of this medicine contact a poison control center or emergency room at once. NOTE: This medicine is only for you. Do not share this medicine with others. What if I miss a dose? If you miss a dose, skip the missed dose and take your next tablet at the regular time. There should be at least 8 hours between doses. Do not take double or extra doses. What may interact with this medicine? Do not take this medicine with any of the following medications: -linezolid -MAOIs like Azilect, Carbex, Eldepryl, Marplan, Nardil, and Parnate -methylene blue (injected into a vein) -other medicines that contain bupropion like Zyban This medicine may also interact with the following medications: -alcohol -certain medicines for anxiety or sleep -certain medicines for blood pressure like metoprolol, propranolol -certain medicines for depression or psychotic disturbances -certain medicines for HIV or AIDS like efavirenz, lopinavir, nelfinavir, ritonavir -certain medicines for irregular heart beat like propafenone, flecainide -certain medicines for Parkinson's disease like amantadine, levodopa -  certain medicines for seizures like carbamazepine, phenytoin, phenobarbital -cimetidine -clopidogrel -cyclophosphamide -digoxin -furazolidone -isoniazid -nicotine -orphenadrine -procarbazine -steroid medicines like prednisone or cortisone -stimulant medicines for attention disorders, weight loss, or to stay awake -tamoxifen -theophylline -thiotepa -ticlopidine -tramadol -warfarin This list may not describe all possible interactions. Give your health care provider a list of all the medicines, herbs, non-prescription drugs, or dietary supplements you use. Also tell them if you smoke, drink alcohol, or use illegal drugs. Some items may interact with your medicine. What should I watch for while using this medicine? Tell your doctor if your  symptoms do not get better or if they get worse. Visit your doctor or health care professional for regular checks on your progress. Because it may take several weeks to see the full effects of this medicine, it is important to continue your treatment as prescribed by your doctor. Patients and their families should watch out for new or worsening thoughts of suicide or depression. Also watch out for sudden changes in feelings such as feeling anxious, agitated, panicky, irritable, hostile, aggressive, impulsive, severely restless, overly excited and hyperactive, or not being able to sleep. If this happens, especially at the beginning of treatment or after a change in dose, call your health care professional. Avoid alcoholic drinks while taking this medicine. Drinking excessive alcoholic beverages, using sleeping or anxiety medicines, or quickly stopping the use of these agents while taking this medicine may increase your risk for a seizure. Do not drive or use heavy machinery until you know how this medicine affects you. This medicine can impair your ability to perform these tasks. Do not take this medicine close to bedtime. It may prevent you from sleeping. Your mouth may get dry. Chewing sugarless gum or sucking hard candy, and drinking plenty of water may help. Contact your doctor if the problem does not go away or is severe. What side effects may I notice from receiving this medicine? Side effects that you should report to your doctor or health care professional as soon as possible: -allergic reactions like skin rash, itching or hives, swelling of the face, lips, or tongue -breathing problems -changes in vision -confusion -elevated mood, decreased need for sleep, racing thoughts, impulsive behavior -fast or irregular heartbeat -hallucinations, loss of contact with reality -increased blood pressure -redness, blistering, peeling or loosening of the skin, including inside the  mouth -seizures -suicidal thoughts or other mood changes -unusually weak or tired -vomiting Side effects that usually do not require medical attention (report to your doctor or health care professional if they continue or are bothersome): -constipation -headache -loss of appetite -nausea -tremors -weight loss This list may not describe all possible side effects. Call your doctor for medical advice about side effects. You may report side effects to FDA at 1-800-FDA-1088. Where should I keep my medicine? Keep out of the reach of children. Store at room temperature between 20 and 25 degrees C (68 and 77 degrees F), away from direct sunlight and moisture. Keep tightly closed. Throw away any unused medicine after the expiration date. NOTE: This sheet is a summary. It may not cover all possible information. If you have questions about this medicine, talk to your doctor, pharmacist, or health care provider.  2018 Elsevier/Gold Standard (2015-11-20 13:52:19)

## 2018-02-19 ENCOUNTER — Other Ambulatory Visit: Payer: Self-pay | Admitting: Internal Medicine

## 2018-02-19 ENCOUNTER — Other Ambulatory Visit: Payer: Self-pay | Admitting: *Deleted

## 2018-02-19 ENCOUNTER — Encounter: Payer: Self-pay | Admitting: *Deleted

## 2018-02-19 DIAGNOSIS — F320 Major depressive disorder, single episode, mild: Secondary | ICD-10-CM

## 2018-02-19 MED ORDER — BUPROPION HCL ER (XL) 150 MG PO TB24: 150.0000 mg | ORAL_TABLET | Freq: Every day | ORAL | 1 refills | Status: DC

## 2018-02-19 MED ORDER — BISOPROLOL-HYDROCHLOROTHIAZIDE 10-6.25 MG PO TABS: 1.0000 | ORAL_TABLET | Freq: Every day | ORAL | 3 refills | Status: DC

## 2018-02-19 MED ORDER — DIAZEPAM 5 MG PO TABS: ORAL_TABLET | ORAL | 0 refills | Status: DC

## 2018-02-19 MED ORDER — MELOXICAM 15 MG PO TABS: ORAL_TABLET | ORAL | 0 refills | Status: DC

## 2018-02-20 DIAGNOSIS — D3000 Benign neoplasm of unspecified kidney: Secondary | ICD-10-CM | POA: Diagnosis not present

## 2018-02-21 ENCOUNTER — Encounter (HOSPITAL_COMMUNITY): Payer: Self-pay

## 2018-02-21 ENCOUNTER — Other Ambulatory Visit: Payer: Self-pay

## 2018-02-21 ENCOUNTER — Encounter (HOSPITAL_COMMUNITY)
Admission: RE | Admit: 2018-02-21 | Discharge: 2018-02-21 | Disposition: A | Discharge status: Home / Self Care | Payer: Medicare Other | Source: Ambulatory Visit | Attending: Surgery | Admitting: Surgery

## 2018-02-21 DIAGNOSIS — R001 Bradycardia, unspecified: Secondary | ICD-10-CM | POA: Diagnosis not present

## 2018-02-21 DIAGNOSIS — Z01818 Encounter for other preprocedural examination: Secondary | ICD-10-CM | POA: Diagnosis not present

## 2018-02-21 DIAGNOSIS — R079 Chest pain, unspecified: Secondary | ICD-10-CM | POA: Insufficient documentation

## 2018-02-21 LAB — CBC
HCT: 47.7 % (ref 39.0–52.0)
Hemoglobin: 16.5 g/dL (ref 13.0–17.0)
MCH: 31.5 pg (ref 26.0–34.0)
MCHC: 34.6 g/dL (ref 30.0–36.0)
MCV: 91.2 fL (ref 78.0–100.0)
Platelets: 151 10*3/uL (ref 150–400)
RBC: 5.23 MIL/uL (ref 4.22–5.81)
RDW: 13.6 % (ref 11.5–15.5)
WBC: 5.6 10*3/uL (ref 4.0–10.5)

## 2018-02-21 LAB — COMPREHENSIVE METABOLIC PANEL
ALBUMIN: 3.7 g/dL (ref 3.5–5.0)
ALT: 19 U/L (ref 0–44)
AST: 28 U/L (ref 15–41)
Alkaline Phosphatase: 50 U/L (ref 38–126)
Anion gap: 10 (ref 5–15)
BUN: 27 mg/dL — ABNORMAL HIGH (ref 8–23)
CALCIUM: 9.2 mg/dL (ref 8.9–10.3)
CO2: 27 mmol/L (ref 22–32)
CREATININE: 1.05 mg/dL (ref 0.61–1.24)
Chloride: 101 mmol/L (ref 98–111)
GFR calc Af Amer: >60 mL/min (ref >60)
GFR calc non Af Amer: >60 mL/min (ref >60)
GLUCOSE: 86 mg/dL (ref 70–99)
Potassium: 4.1 mmol/L (ref 3.5–5.1)
SODIUM: 138 mmol/L (ref 135–145)
Total Bilirubin: 0.8 mg/dL (ref 0.3–1.2)
Total Protein: 7.3 g/dL (ref 6.5–8.1)

## 2018-02-21 LAB — ABO/RH: ABO/RH(D): O POS

## 2018-02-21 LAB — GLUCOSE, CAPILLARY: Glucose-Capillary: 111 mg/dL — ABNORMAL HIGH (ref 70–99)

## 2018-02-21 LAB — HEMOGLOBIN A1C
Hgb A1c MFr Bld: 5.7 % — ABNORMAL HIGH (ref 4.8–5.6)
MEAN PLASMA GLUCOSE: 116.89 mg/dL

## 2018-02-21 NOTE — Progress Notes (Signed)
CMET RESULTS ROUTED TO DR [place]

## 2018-02-22 ENCOUNTER — Other Ambulatory Visit: Payer: Self-pay | Admitting: *Deleted

## 2018-02-22 DIAGNOSIS — F320 Major depressive disorder, single episode, mild: Secondary | ICD-10-CM

## 2018-02-22 MED ORDER — BUPROPION HCL ER (XL) 150 MG PO TB24: 150.0000 mg | ORAL_TABLET | Freq: Every day | ORAL | 1 refills | Status: DC

## 2018-03-01 NOTE — Anesthesia Preprocedure Evaluation (Addendum)
Anesthesia Evaluation  Patient identified by MRN, date of birth, ID band Patient awake    Reviewed: Allergy & Precautions, NPO status , Patient's Chart, lab work & pertinent test results  Airway Mallampati: III  TM Distance: >3 FB Neck ROM: Full    Dental  (+) Partial Lower, Upper Dentures   Pulmonary asthma , sleep apnea , COPD,  COPD inhaler, former smoker,    Pulmonary exam normal breath sounds clear to auscultation       Cardiovascular hypertension, Pt. on home beta blockers and Pt. on medications + angina + CAD and + Cardiac Stents (x 4)  Normal cardiovascular exam Rhythm:Regular Rate:Normal  ECG: SB, rate 59  Primary Cardiologist: Ena Dawley, MD   Neuro/Psych  Headaches, PSYCHIATRIC DISORDERS Anxiety Depression    GI/Hepatic Neg liver ROS, PUD, GERD  ,  Endo/Other  diabetes  Renal/GU negative Renal ROS     Musculoskeletal negative musculoskeletal ROS (+)   Abdominal   Peds  Hematology HLD   Anesthesia Other Findings Recurrent incisional hernia  Reproductive/Obstetrics                           Anesthesia Physical Anesthesia Plan  ASA: IV  Anesthesia Plan: General   Post-op Pain Management:    Induction: Intravenous  PONV Risk Score and Plan: 2 and Treatment may vary due to age or medical condition, Ondansetron and Dexamethasone  Airway Management Planned: Oral ETT  Additional Equipment: Arterial line  Intra-op Plan:   Post-operative Plan: Extubation in OR and Possible Post-op intubation/ventilation  Informed Consent: I have reviewed the patients History and Physical, chart, labs and discussed the procedure including the risks, benefits and alternatives for the proposed anesthesia with the patient or authorized representative who has indicated his/her understanding and acceptance.   Dental advisory given  Plan Discussed with: CRNA  Anesthesia Plan Comments:         Anesthesia Quick Evaluation

## 2018-03-02 ENCOUNTER — Observation Stay (HOSPITAL_COMMUNITY)
Admission: RE | Admit: 2018-03-02 | Discharge: 2018-03-05 | Disposition: A | Discharge status: Home / Self Care | Payer: Medicare Other | Source: Ambulatory Visit | Attending: Urology | Admitting: Urology

## 2018-03-02 ENCOUNTER — Encounter (HOSPITAL_COMMUNITY): Payer: Self-pay

## 2018-03-02 ENCOUNTER — Inpatient Hospital Stay (HOSPITAL_COMMUNITY): Payer: Medicare Other | Admitting: Certified Registered Nurse Anesthetist

## 2018-03-02 ENCOUNTER — Other Ambulatory Visit: Payer: Self-pay

## 2018-03-02 ENCOUNTER — Encounter (HOSPITAL_COMMUNITY)
Admission: RE | Disposition: A | Discharge status: Home / Self Care | Payer: Self-pay | Source: Ambulatory Visit | Attending: Urology

## 2018-03-02 DIAGNOSIS — E669 Obesity, unspecified: Secondary | ICD-10-CM | POA: Insufficient documentation

## 2018-03-02 DIAGNOSIS — K5900 Constipation, unspecified: Secondary | ICD-10-CM | POA: Insufficient documentation

## 2018-03-02 DIAGNOSIS — E785 Hyperlipidemia, unspecified: Secondary | ICD-10-CM | POA: Diagnosis not present

## 2018-03-02 DIAGNOSIS — K219 Gastro-esophageal reflux disease without esophagitis: Secondary | ICD-10-CM | POA: Diagnosis not present

## 2018-03-02 DIAGNOSIS — K430 Incisional hernia with obstruction, without gangrene: Principal | ICD-10-CM | POA: Insufficient documentation

## 2018-03-02 DIAGNOSIS — Z7982 Long term (current) use of aspirin: Secondary | ICD-10-CM | POA: Diagnosis not present

## 2018-03-02 DIAGNOSIS — Z6830 Body mass index (BMI) 30.0-30.9, adult: Secondary | ICD-10-CM | POA: Insufficient documentation

## 2018-03-02 DIAGNOSIS — Z794 Long term (current) use of insulin: Secondary | ICD-10-CM | POA: Diagnosis not present

## 2018-03-02 DIAGNOSIS — Z886 Allergy status to analgesic agent status: Secondary | ICD-10-CM | POA: Insufficient documentation

## 2018-03-02 DIAGNOSIS — F419 Anxiety disorder, unspecified: Secondary | ICD-10-CM | POA: Diagnosis not present

## 2018-03-02 DIAGNOSIS — M545 Low back pain: Secondary | ICD-10-CM | POA: Insufficient documentation

## 2018-03-02 DIAGNOSIS — J310 Chronic rhinitis: Secondary | ICD-10-CM | POA: Insufficient documentation

## 2018-03-02 DIAGNOSIS — K432 Incisional hernia without obstruction or gangrene: Secondary | ICD-10-CM | POA: Diagnosis not present

## 2018-03-02 DIAGNOSIS — J439 Emphysema, unspecified: Secondary | ICD-10-CM | POA: Insufficient documentation

## 2018-03-02 DIAGNOSIS — F329 Major depressive disorder, single episode, unspecified: Secondary | ICD-10-CM | POA: Diagnosis not present

## 2018-03-02 DIAGNOSIS — Z87891 Personal history of nicotine dependence: Secondary | ICD-10-CM | POA: Insufficient documentation

## 2018-03-02 DIAGNOSIS — Z7902 Long term (current) use of antithrombotics/antiplatelets: Secondary | ICD-10-CM | POA: Insufficient documentation

## 2018-03-02 DIAGNOSIS — I25119 Atherosclerotic heart disease of native coronary artery with unspecified angina pectoris: Secondary | ICD-10-CM | POA: Insufficient documentation

## 2018-03-02 DIAGNOSIS — G4730 Sleep apnea, unspecified: Secondary | ICD-10-CM | POA: Insufficient documentation

## 2018-03-02 DIAGNOSIS — Z9841 Cataract extraction status, right eye: Secondary | ICD-10-CM | POA: Diagnosis not present

## 2018-03-02 DIAGNOSIS — K660 Peritoneal adhesions (postprocedural) (postinfection): Secondary | ICD-10-CM | POA: Insufficient documentation

## 2018-03-02 DIAGNOSIS — Z800 Family history of malignant neoplasm of digestive organs: Secondary | ICD-10-CM | POA: Insufficient documentation

## 2018-03-02 DIAGNOSIS — Z888 Allergy status to other drugs, medicaments and biological substances status: Secondary | ICD-10-CM | POA: Insufficient documentation

## 2018-03-02 DIAGNOSIS — J45909 Unspecified asthma, uncomplicated: Secondary | ICD-10-CM | POA: Insufficient documentation

## 2018-03-02 DIAGNOSIS — N400 Enlarged prostate without lower urinary tract symptoms: Secondary | ICD-10-CM | POA: Diagnosis not present

## 2018-03-02 DIAGNOSIS — M6208 Separation of muscle (nontraumatic), other site: Secondary | ICD-10-CM | POA: Insufficient documentation

## 2018-03-02 DIAGNOSIS — G4733 Obstructive sleep apnea (adult) (pediatric): Secondary | ICD-10-CM | POA: Diagnosis not present

## 2018-03-02 DIAGNOSIS — E559 Vitamin D deficiency, unspecified: Secondary | ICD-10-CM | POA: Insufficient documentation

## 2018-03-02 DIAGNOSIS — Z905 Acquired absence of kidney: Secondary | ICD-10-CM | POA: Insufficient documentation

## 2018-03-02 DIAGNOSIS — Z880 Allergy status to penicillin: Secondary | ICD-10-CM | POA: Insufficient documentation

## 2018-03-02 DIAGNOSIS — E7800 Pure hypercholesterolemia, unspecified: Secondary | ICD-10-CM | POA: Insufficient documentation

## 2018-03-02 DIAGNOSIS — N2889 Other specified disorders of kidney and ureter: Secondary | ICD-10-CM | POA: Insufficient documentation

## 2018-03-02 DIAGNOSIS — I700 Atherosclerosis of aorta: Secondary | ICD-10-CM | POA: Insufficient documentation

## 2018-03-02 DIAGNOSIS — Z23 Encounter for immunization: Secondary | ICD-10-CM | POA: Diagnosis not present

## 2018-03-02 DIAGNOSIS — K449 Diaphragmatic hernia without obstruction or gangrene: Secondary | ICD-10-CM | POA: Insufficient documentation

## 2018-03-02 DIAGNOSIS — Z9842 Cataract extraction status, left eye: Secondary | ICD-10-CM | POA: Insufficient documentation

## 2018-03-02 DIAGNOSIS — I10 Essential (primary) hypertension: Secondary | ICD-10-CM | POA: Insufficient documentation

## 2018-03-02 DIAGNOSIS — Z8249 Family history of ischemic heart disease and other diseases of the circulatory system: Secondary | ICD-10-CM | POA: Insufficient documentation

## 2018-03-02 DIAGNOSIS — I252 Old myocardial infarction: Secondary | ICD-10-CM | POA: Insufficient documentation

## 2018-03-02 DIAGNOSIS — D49512 Neoplasm of unspecified behavior of left kidney: Secondary | ICD-10-CM | POA: Diagnosis not present

## 2018-03-02 DIAGNOSIS — R109 Unspecified abdominal pain: Secondary | ICD-10-CM | POA: Insufficient documentation

## 2018-03-02 DIAGNOSIS — Z85820 Personal history of malignant melanoma of skin: Secondary | ICD-10-CM | POA: Insufficient documentation

## 2018-03-02 DIAGNOSIS — Z955 Presence of coronary angioplasty implant and graft: Secondary | ICD-10-CM | POA: Insufficient documentation

## 2018-03-02 DIAGNOSIS — Z882 Allergy status to sulfonamides status: Secondary | ICD-10-CM | POA: Insufficient documentation

## 2018-03-02 DIAGNOSIS — Z79899 Other long term (current) drug therapy: Secondary | ICD-10-CM | POA: Insufficient documentation

## 2018-03-02 DIAGNOSIS — E119 Type 2 diabetes mellitus without complications: Secondary | ICD-10-CM | POA: Insufficient documentation

## 2018-03-02 DIAGNOSIS — G44009 Cluster headache syndrome, unspecified, not intractable: Secondary | ICD-10-CM | POA: Insufficient documentation

## 2018-03-02 DIAGNOSIS — K259 Gastric ulcer, unspecified as acute or chronic, without hemorrhage or perforation: Secondary | ICD-10-CM | POA: Insufficient documentation

## 2018-03-02 DIAGNOSIS — Z885 Allergy status to narcotic agent status: Secondary | ICD-10-CM | POA: Insufficient documentation

## 2018-03-02 DIAGNOSIS — D4102 Neoplasm of uncertain behavior of left kidney: Secondary | ICD-10-CM | POA: Diagnosis not present

## 2018-03-02 DIAGNOSIS — M1990 Unspecified osteoarthritis, unspecified site: Secondary | ICD-10-CM | POA: Insufficient documentation

## 2018-03-02 DIAGNOSIS — C642 Malignant neoplasm of left kidney, except renal pelvis: Secondary | ICD-10-CM | POA: Diagnosis not present

## 2018-03-02 HISTORY — PX: VENTRAL HERNIA REPAIR: SHX424

## 2018-03-02 HISTORY — PX: ROBOTIC ASSITED PARTIAL NEPHRECTOMY: SHX6087

## 2018-03-02 LAB — TYPE AND SCREEN
ABO/RH(D): O POS
Antibody Screen: NEGATIVE

## 2018-03-02 LAB — GLUCOSE, CAPILLARY
GLUCOSE-CAPILLARY: 69 mg/dL — AB (ref 70–99)
GLUCOSE-CAPILLARY: 137 mg/dL — AB (ref 70–99)
Glucose-Capillary: 167 mg/dL — ABNORMAL HIGH (ref 70–99)
Glucose-Capillary: 119 mg/dL — ABNORMAL HIGH (ref 70–99)

## 2018-03-02 LAB — HEMOGLOBIN AND HEMATOCRIT, BLOOD
HEMATOCRIT: 46.2 % (ref 39.0–52.0)
HEMOGLOBIN: 15.5 g/dL (ref 13.0–17.0)

## 2018-03-02 SURGERY — REPAIR, HERNIA, VENTRAL, LAPAROSCOPIC
Anesthesia: General | Site: Abdomen

## 2018-03-02 MED ORDER — NITROGLYCERIN 0.4 MG SL SUBL: 0.4000 mg | SUBLINGUAL_TABLET | SUBLINGUAL | Status: DC | PRN

## 2018-03-02 MED ORDER — THROMBIN 5000 UNITS EX SOLR
CUTANEOUS | Status: DC | PRN
  Administered 2018-03-02: 5000 [IU] via TOPICAL

## 2018-03-02 MED ORDER — ATROPINE SULFATE 0.4 MG/ML IV SOSY
PREFILLED_SYRINGE | INTRAVENOUS | Status: DC | PRN
  Administered 2018-03-02: .2 mg via INTRAVENOUS

## 2018-03-02 MED ORDER — FENTANYL CITRATE (PF) 100 MCG/2ML IJ SOLN
INTRAMUSCULAR | Status: AC
  Filled 2018-03-02: qty 2

## 2018-03-02 MED ORDER — EPHEDRINE SULFATE-NACL 50-0.9 MG/10ML-% IV SOSY
PREFILLED_SYRINGE | INTRAVENOUS | Status: DC | PRN
  Administered 2018-03-02: 20 mg via INTRAVENOUS

## 2018-03-02 MED ORDER — SODIUM CHLORIDE 0.45 % IV SOLN
INTRAVENOUS | Status: DC
  Administered 2018-03-02 – 2018-03-03 (×3): via INTRAVENOUS

## 2018-03-02 MED ORDER — SODIUM CHLORIDE 0.9 % IJ SOLN
INTRAMUSCULAR | Status: DC | PRN
  Administered 2018-03-02: 50 mL

## 2018-03-02 MED ORDER — KETAMINE HCL 10 MG/ML IJ SOLN
INTRAMUSCULAR | Status: AC
  Filled 2018-03-02: qty 1

## 2018-03-02 MED ORDER — TAMSULOSIN HCL 0.4 MG PO CAPS
0.4000 mg | ORAL_CAPSULE | Freq: Every day | ORAL | Status: DC
  Administered 2018-03-03 – 2018-03-05 (×3): 0.4 mg via ORAL
  Filled 2018-03-02 (×3): qty 1

## 2018-03-02 MED ORDER — ROCURONIUM BROMIDE 100 MG/10ML IV SOLN
INTRAVENOUS | Status: DC | PRN
  Administered 2018-03-02 (×4): 30 mg via INTRAVENOUS
  Administered 2018-03-02: 60 mg via INTRAVENOUS
  Administered 2018-03-02: 30 mg via INTRAVENOUS
  Administered 2018-03-02: 20 mg via INTRAVENOUS
  Administered 2018-03-02: 30 mg via INTRAVENOUS

## 2018-03-02 MED ORDER — ACETAMINOPHEN 10 MG/ML IV SOLN
1000.0000 mg | Freq: Four times a day (QID) | INTRAVENOUS | Status: AC
  Administered 2018-03-02 – 2018-03-03 (×3): 1000 mg via INTRAVENOUS
  Filled 2018-03-02 (×4): qty 100

## 2018-03-02 MED ORDER — BUPIVACAINE LIPOSOME 1.3 % IJ SUSP
20.0000 mL | Freq: Once | INTRAMUSCULAR | Status: DC
  Filled 2018-03-02: qty 20

## 2018-03-02 MED ORDER — ALUM & MAG HYDROXIDE-SIMETH 200-200-20 MG/5ML PO SUSP: 30.0000 mL | Freq: Four times a day (QID) | ORAL | Status: DC | PRN

## 2018-03-02 MED ORDER — INSULIN ASPART 100 UNIT/ML ~~LOC~~ SOLN: 0.0000 [IU] | Freq: Three times a day (TID) | SUBCUTANEOUS | Status: DC

## 2018-03-02 MED ORDER — CEFAZOLIN SODIUM-DEXTROSE 2-3 GM-%(50ML) IV SOLR
INTRAVENOUS | Status: DC | PRN
  Administered 2018-03-02 (×2): 2 g via INTRAVENOUS

## 2018-03-02 MED ORDER — MORPHINE SULFATE (PF) 2 MG/ML IV SOLN
2.0000 mg | INTRAVENOUS | Status: DC | PRN
  Administered 2018-03-02 – 2018-03-03 (×2): 2 mg via INTRAVENOUS
  Filled 2018-03-02 (×3): qty 1

## 2018-03-02 MED ORDER — PROPOFOL 10 MG/ML IV BOLUS
INTRAVENOUS | Status: AC
  Filled 2018-03-02: qty 20

## 2018-03-02 MED ORDER — LIP MEDEX EX OINT
1.0000 "application " | TOPICAL_OINTMENT | Freq: Two times a day (BID) | CUTANEOUS | Status: DC
  Administered 2018-03-02 – 2018-03-05 (×6): 1 via TOPICAL
  Filled 2018-03-02: qty 7

## 2018-03-02 MED ORDER — SUGAMMADEX SODIUM 200 MG/2ML IV SOLN
INTRAVENOUS | Status: DC | PRN
  Administered 2018-03-02: 200 mg via INTRAVENOUS

## 2018-03-02 MED ORDER — BUPROPION HCL ER (XL) 150 MG PO TB24
150.0000 mg | ORAL_TABLET | Freq: Every day | ORAL | Status: DC
  Administered 2018-03-02 – 2018-03-05 (×4): 150 mg via ORAL
  Filled 2018-03-02 (×4): qty 1

## 2018-03-02 MED ORDER — ROCURONIUM BROMIDE 10 MG/ML (PF) SYRINGE
PREFILLED_SYRINGE | INTRAVENOUS | Status: AC
  Filled 2018-03-02: qty 10

## 2018-03-02 MED ORDER — THROMBIN (RECOMBINANT) 5000 UNITS EX SOLR
CUTANEOUS | Status: AC
  Filled 2018-03-02: qty 5000

## 2018-03-02 MED ORDER — PHENYLEPHRINE 40 MCG/ML (10ML) SYRINGE FOR IV PUSH (FOR BLOOD PRESSURE SUPPORT)
PREFILLED_SYRINGE | INTRAVENOUS | Status: DC | PRN
  Administered 2018-03-02 (×4): 80 ug via INTRAVENOUS

## 2018-03-02 MED ORDER — KETAMINE HCL 10 MG/ML IJ SOLN
INTRAMUSCULAR | Status: DC | PRN
  Administered 2018-03-02: 30 mg via INTRAVENOUS
  Administered 2018-03-02 (×3): 10 mg via INTRAVENOUS

## 2018-03-02 MED ORDER — MIDAZOLAM HCL 2 MG/2ML IJ SOLN
INTRAMUSCULAR | Status: AC
  Filled 2018-03-02: qty 2

## 2018-03-02 MED ORDER — NIACIN 500 MG PO TABS
500.0000 mg | ORAL_TABLET | Freq: Two times a day (BID) | ORAL | Status: DC
  Administered 2018-03-02 – 2018-03-04 (×5): 500 mg via ORAL
  Filled 2018-03-02 (×6): qty 1

## 2018-03-02 MED ORDER — IPRATROPIUM BROMIDE 0.03 % NA SOLN: 1.0000 | Freq: Every day | NASAL | Status: DC | PRN

## 2018-03-02 MED ORDER — HYDROCORTISONE 2.5 % RE CREA: 1.0000 "application " | TOPICAL_CREAM | Freq: Four times a day (QID) | RECTAL | Status: DC | PRN

## 2018-03-02 MED ORDER — ACETAMINOPHEN 500 MG PO TABS
1000.0000 mg | ORAL_TABLET | Freq: Three times a day (TID) | ORAL | Status: DC
  Administered 2018-03-02: 1000 mg via ORAL
  Filled 2018-03-02: qty 2

## 2018-03-02 MED ORDER — GABAPENTIN 300 MG PO CAPS
300.0000 mg | ORAL_CAPSULE | Freq: Two times a day (BID) | ORAL | Status: DC
  Administered 2018-03-02 – 2018-03-05 (×6): 300 mg via ORAL
  Filled 2018-03-02 (×6): qty 1

## 2018-03-02 MED ORDER — FENTANYL CITRATE (PF) 100 MCG/2ML IJ SOLN
INTRAMUSCULAR | Status: AC
  Administered 2018-03-02: 50 ug via INTRAVENOUS
  Filled 2018-03-02: qty 2

## 2018-03-02 MED ORDER — FENTANYL CITRATE (PF) 100 MCG/2ML IJ SOLN
INTRAMUSCULAR | Status: DC | PRN
  Administered 2018-03-02 (×2): 100 ug via INTRAVENOUS
  Administered 2018-03-02 (×5): 50 ug via INTRAVENOUS

## 2018-03-02 MED ORDER — BUPIVACAINE LIPOSOME 1.3 % IJ SUSP
INTRAMUSCULAR | Status: DC | PRN
  Administered 2018-03-02: 20 mL

## 2018-03-02 MED ORDER — CEFAZOLIN SODIUM-DEXTROSE 2-4 GM/100ML-% IV SOLN
INTRAVENOUS | Status: AC
  Filled 2018-03-02: qty 100

## 2018-03-02 MED ORDER — CHOLINE CITRATE 650 MG PO TABS: 650.0000 mg | ORAL_TABLET | Freq: Every day | ORAL | Status: DC

## 2018-03-02 MED ORDER — PHENOL 1.4 % MT LIQD: 1.0000 | OROMUCOSAL | Status: DC | PRN

## 2018-03-02 MED ORDER — SODIUM CHLORIDE 0.9 % IJ SOLN
INTRAMUSCULAR | Status: AC
  Filled 2018-03-02: qty 50

## 2018-03-02 MED ORDER — FENTANYL CITRATE (PF) 100 MCG/2ML IJ SOLN
25.0000 ug | INTRAMUSCULAR | Status: DC | PRN
  Administered 2018-03-02: 50 ug via INTRAVENOUS

## 2018-03-02 MED ORDER — LIDOCAINE 2% (20 MG/ML) 5 ML SYRINGE
INTRAMUSCULAR | Status: AC
  Filled 2018-03-02: qty 5

## 2018-03-02 MED ORDER — BISOPROLOL-HYDROCHLOROTHIAZIDE 10-6.25 MG PO TABS
1.0000 | ORAL_TABLET | Freq: Every day | ORAL | Status: DC
  Administered 2018-03-02 – 2018-03-05 (×4): 1 via ORAL
  Filled 2018-03-02 (×4): qty 1

## 2018-03-02 MED ORDER — ACETAMINOPHEN 325 MG PO TABS: 650.0000 mg | ORAL_TABLET | ORAL | Status: DC | PRN

## 2018-03-02 MED ORDER — ONDANSETRON HCL 4 MG/2ML IJ SOLN: 4.0000 mg | INTRAMUSCULAR | Status: DC | PRN

## 2018-03-02 MED ORDER — MIDAZOLAM HCL 5 MG/5ML IJ SOLN
INTRAMUSCULAR | Status: DC | PRN
  Administered 2018-03-02: 2 mg via INTRAVENOUS

## 2018-03-02 MED ORDER — CEFAZOLIN SODIUM-DEXTROSE 2-4 GM/100ML-% IV SOLN
2.0000 g | Freq: Three times a day (TID) | INTRAVENOUS | Status: AC
  Administered 2018-03-02 – 2018-03-03 (×2): 2 g via INTRAVENOUS
  Filled 2018-03-02 (×2): qty 100

## 2018-03-02 MED ORDER — MENTHOL 3 MG MT LOZG: 1.0000 | LOZENGE | OROMUCOSAL | Status: DC | PRN

## 2018-03-02 MED ORDER — HYDROCORTISONE 1 % EX CREA: 1.0000 "application " | TOPICAL_CREAM | Freq: Three times a day (TID) | CUTANEOUS | Status: DC | PRN

## 2018-03-02 MED ORDER — INFLUENZA VAC SPLIT HIGH-DOSE 0.5 ML IM SUSY
0.5000 mL | PREFILLED_SYRINGE | INTRAMUSCULAR | Status: AC
  Administered 2018-03-05: 0.5 mL via INTRAMUSCULAR
  Filled 2018-03-02 (×2): qty 0.5

## 2018-03-02 MED ORDER — DIPHENHYDRAMINE HCL 50 MG/ML IJ SOLN: 12.5000 mg | Freq: Four times a day (QID) | INTRAMUSCULAR | Status: DC | PRN

## 2018-03-02 MED ORDER — GUAIFENESIN-DM 100-10 MG/5ML PO SYRP: 10.0000 mL | ORAL_SOLUTION | ORAL | Status: DC | PRN

## 2018-03-02 MED ORDER — SODIUM CHLORIDE 0.9 % IV SOLN
INTRAVENOUS | Status: DC | PRN
  Administered 2018-03-02: 100 ug/min via INTRAVENOUS

## 2018-03-02 MED ORDER — LIDOCAINE 2% (20 MG/ML) 5 ML SYRINGE
INTRAMUSCULAR | Status: DC | PRN
  Administered 2018-03-02: 1 mg/kg/h via INTRAVENOUS

## 2018-03-02 MED ORDER — FENTANYL CITRATE (PF) 250 MCG/5ML IJ SOLN
INTRAMUSCULAR | Status: AC
  Filled 2018-03-02: qty 5

## 2018-03-02 MED ORDER — PSYLLIUM 95 % PO PACK
1.0000 | PACK | Freq: Every day | ORAL | Status: DC
  Administered 2018-03-02 – 2018-03-05 (×4): 1 via ORAL
  Filled 2018-03-02 (×4): qty 1

## 2018-03-02 MED ORDER — MAGIC MOUTHWASH
15.0000 mL | Freq: Four times a day (QID) | ORAL | Status: DC | PRN
  Filled 2018-03-02: qty 15

## 2018-03-02 MED ORDER — BISOPROLOL FUMARATE 10 MG PO TABS
10.0000 mg | ORAL_TABLET | Freq: Once | ORAL | Status: AC
  Administered 2018-03-02: 10 mg via ORAL
  Filled 2018-03-02: qty 1

## 2018-03-02 MED ORDER — FINASTERIDE 5 MG PO TABS
5.0000 mg | ORAL_TABLET | Freq: Every day | ORAL | Status: DC
  Administered 2018-03-02 – 2018-03-05 (×4): 5 mg via ORAL
  Filled 2018-03-02 (×4): qty 1

## 2018-03-02 MED ORDER — BUPIVACAINE-EPINEPHRINE 0.25% -1:200000 IJ SOLN
INTRAMUSCULAR | Status: DC | PRN
  Administered 2018-03-02: 30 mL

## 2018-03-02 MED ORDER — TRAMADOL HCL 50 MG PO TABS: 50.0000 mg | ORAL_TABLET | Freq: Four times a day (QID) | ORAL | 0 refills | Status: DC | PRN

## 2018-03-02 MED ORDER — BUPIVACAINE-EPINEPHRINE (PF) 0.25% -1:200000 IJ SOLN
INTRAMUSCULAR | Status: AC
  Filled 2018-03-02: qty 30

## 2018-03-02 MED ORDER — ONDANSETRON HCL 4 MG/2ML IJ SOLN
INTRAMUSCULAR | Status: DC | PRN
  Administered 2018-03-02: 4 mg via INTRAVENOUS

## 2018-03-02 MED ORDER — BISACODYL 10 MG RE SUPP
10.0000 mg | Freq: Two times a day (BID) | RECTAL | Status: DC | PRN
  Administered 2018-03-04: 10 mg via RECTAL
  Filled 2018-03-02: qty 1

## 2018-03-02 MED ORDER — LACTATED RINGERS IV SOLN
INTRAVENOUS | Status: DC
  Administered 2018-03-02: 1000 mL via INTRAVENOUS
  Administered 2018-03-02 (×2): via INTRAVENOUS

## 2018-03-02 MED ORDER — DIPHENHYDRAMINE HCL 12.5 MG/5ML PO ELIX: 12.5000 mg | ORAL_SOLUTION | Freq: Four times a day (QID) | ORAL | Status: DC | PRN

## 2018-03-02 MED ORDER — PROPOFOL 10 MG/ML IV BOLUS
INTRAVENOUS | Status: DC | PRN
  Administered 2018-03-02: 40 mg via INTRAVENOUS
  Administered 2018-03-02: 120 mg via INTRAVENOUS
  Administered 2018-03-02: 40 mg via INTRAVENOUS

## 2018-03-02 MED ORDER — ONDANSETRON HCL 4 MG/2ML IJ SOLN: 4.0000 mg | Freq: Once | INTRAMUSCULAR | Status: DC | PRN

## 2018-03-02 MED ORDER — STERILE WATER FOR IRRIGATION IR SOLN
Status: DC | PRN
  Administered 2018-03-02: 1000 mL

## 2018-03-02 MED ORDER — LIDOCAINE HCL (CARDIAC) PF 100 MG/5ML IV SOSY
PREFILLED_SYRINGE | INTRAVENOUS | Status: DC | PRN
  Administered 2018-03-02: 100 mg via INTRAVENOUS

## 2018-03-02 SURGICAL SUPPLY — 92 items
APPLICATOR ARISTA FLEXITIP XL (MISCELLANEOUS) IMPLANT
APPLICATOR SURGIFLO ENDO (HEMOSTASIS) ×2 IMPLANT
APPLIER CLIP 5 13 M/L LIGAMAX5 (MISCELLANEOUS)
BINDER ABDOMINAL 12 ML 46-62 (SOFTGOODS) ×2 IMPLANT
CABLE HIGH FREQUENCY MONO STRZ (ELECTRODE) ×4 IMPLANT
CHLORAPREP W/TINT 26ML (MISCELLANEOUS) ×8 IMPLANT
CLIP APPLIE 5 13 M/L LIGAMAX5 (MISCELLANEOUS) IMPLANT
CLIP VESOLOCK LG 6/CT PURPLE (CLIP) ×4 IMPLANT
CLIP VESOLOCK MED LG 6/CT (CLIP) ×8 IMPLANT
CLIP VESOLOCK XL 6/CT (CLIP) IMPLANT
CLOSURE WOUND 1/2 X4 (GAUZE/BANDAGES/DRESSINGS) ×2
COVER SURGICAL LIGHT HANDLE (MISCELLANEOUS) ×8 IMPLANT
COVER TIP SHEARS 8 DVNC (MISCELLANEOUS) ×2 IMPLANT
COVER TIP SHEARS 8MM DA VINCI (MISCELLANEOUS) ×2
DECANTER SPIKE VIAL GLASS SM (MISCELLANEOUS) ×8 IMPLANT
DERMABOND ADVANCED (GAUZE/BANDAGES/DRESSINGS) ×2
DERMABOND ADVANCED .7 DNX12 (GAUZE/BANDAGES/DRESSINGS) ×2 IMPLANT
DEVICE SECURE STRAP 25 ABSORB (INSTRUMENTS) ×2 IMPLANT
DEVICE TROCAR PUNCTURE CLOSURE (ENDOMECHANICALS) ×4 IMPLANT
DRAIN CHANNEL 15F RND FF 3/16 (WOUND CARE) ×4 IMPLANT
DRAPE ARM DVNC X/XI (DISPOSABLE) ×8 IMPLANT
DRAPE COLUMN DVNC XI (DISPOSABLE) ×2 IMPLANT
DRAPE DA VINCI XI ARM (DISPOSABLE) ×8
DRAPE DA VINCI XI COLUMN (DISPOSABLE) ×2
DRAPE INCISE IOBAN 66X45 STRL (DRAPES) ×4 IMPLANT
DRAPE INCISE IOBAN 85X60 (DRAPES) ×2 IMPLANT
DRAPE SHEET LG 3/4 BI-LAMINATE (DRAPES) ×4 IMPLANT
DRAPE WARM FLUID 44X44 (DRAPE) ×4 IMPLANT
DRSG TEGADERM 2-3/8X2-3/4 SM (GAUZE/BANDAGES/DRESSINGS) ×16 IMPLANT
DRSG TEGADERM 4X4.75 (GAUZE/BANDAGES/DRESSINGS) ×4 IMPLANT
ELECT PENCIL ROCKER SW 15FT (MISCELLANEOUS) ×4 IMPLANT
ELECT REM PT RETURN 15FT ADLT (MISCELLANEOUS) ×12 IMPLANT
EVACUATOR SILICONE 100CC (DRAIN) ×4 IMPLANT
GAUZE SPONGE 2X2 8PLY STRL LF (GAUZE/BANDAGES/DRESSINGS) IMPLANT
GLOVE BIO SURGEON STRL SZ 6.5 (GLOVE) ×3 IMPLANT
GLOVE BIO SURGEONS STRL SZ 6.5 (GLOVE) ×1
GLOVE BIOGEL M STRL SZ7.5 (GLOVE) ×8 IMPLANT
GLOVE ECLIPSE 8.0 STRL XLNG CF (GLOVE) ×4 IMPLANT
GLOVE INDICATOR 8.0 STRL GRN (GLOVE) ×4 IMPLANT
GOWN STRL REUS W/TWL LRG LVL3 (GOWN DISPOSABLE) ×12 IMPLANT
GOWN STRL REUS W/TWL XL LVL3 (GOWN DISPOSABLE) ×8 IMPLANT
IRRIG SUCT STRYKERFLOW 2 WTIP (MISCELLANEOUS) ×4
IRRIGATION SUCT STRKRFLW 2 WTP (MISCELLANEOUS) ×2 IMPLANT
KIT BASIN OR (CUSTOM PROCEDURE TRAY) ×8 IMPLANT
LOOP VESSEL MAXI BLUE (MISCELLANEOUS) IMPLANT
MARKER SKIN DUAL TIP RULER LAB (MISCELLANEOUS) ×8 IMPLANT
MESH VENTRALIGHT ST 7X9N (Mesh General) ×2 IMPLANT
NDL INSUFFLATION 14GA 120MM (NEEDLE) IMPLANT
NDL SPNL 22GX3.5 QUINCKE BK (NEEDLE) IMPLANT
NEEDLE INSUFFLATION 14GA 120MM (NEEDLE) IMPLANT
NEEDLE SPNL 22GX3.5 QUINCKE BK (NEEDLE) IMPLANT
NS IRRIG 1000ML POUR BTL (IV SOLUTION) ×6 IMPLANT
PACK GENERAL/GYN (CUSTOM PROCEDURE TRAY) ×2 IMPLANT
PACK ROBOT UROLOGY CUSTOM (CUSTOM PROCEDURE TRAY) ×2 IMPLANT
PAD POSITIONING PINK XL (MISCELLANEOUS) ×6 IMPLANT
PORT ACCESS TROCAR AIRSEAL 12 (TROCAR) ×2 IMPLANT
PORT ACCESS TROCAR AIRSEAL 5M (TROCAR) ×2
POSITIONER SURGICAL ARM (MISCELLANEOUS) ×8 IMPLANT
POUCH SPECIMEN RETRIEVAL 10MM (ENDOMECHANICALS) ×4 IMPLANT
SCISSORS LAP 5X35 DISP (ENDOMECHANICALS) ×4 IMPLANT
SEAL CANN UNIV 5-8 DVNC XI (MISCELLANEOUS) ×8 IMPLANT
SEAL XI 5MM-8MM UNIVERSAL (MISCELLANEOUS) ×8
SET TRI-LUMEN FLTR TB AIRSEAL (TUBING) ×4 IMPLANT
SHEARS HARMONIC ACE PLUS 36CM (ENDOMECHANICALS) IMPLANT
SLEEVE ADV FIXATION 5X100MM (TROCAR) ×6 IMPLANT
SOLUTION ELECTROLUBE (MISCELLANEOUS) ×4 IMPLANT
SPOGE SURGIFLO 8M (HEMOSTASIS)
SPONGE GAUZE 2X2 STER 10/PKG (GAUZE/BANDAGES/DRESSINGS) ×2
SPONGE SURGIFLO 8M (HEMOSTASIS) IMPLANT
STRIP CLOSURE SKIN 1/2X4 (GAUZE/BANDAGES/DRESSINGS) ×6 IMPLANT
SURGIFLO W/THROMBIN 8M KIT (HEMOSTASIS) ×4 IMPLANT
SUT ETHILON 3 0 PS 1 (SUTURE) ×6 IMPLANT
SUT MNCRL AB 4-0 PS2 18 (SUTURE) ×12 IMPLANT
SUT PDS AB 1 CT1 27 (SUTURE) ×8 IMPLANT
SUT PROLENE 1 CT 1 30 (SUTURE) ×24 IMPLANT
SUT V-LOC BARB 180 2/0GR6 GS22 (SUTURE) ×4
SUT VIC AB 0 CT1 27 (SUTURE) ×2
SUT VIC AB 0 CT1 27XBRD ANTBC (SUTURE) ×2 IMPLANT
SUT VICRYL 0 UR6 27IN ABS (SUTURE) IMPLANT
SUT VLOC BARB 180 ABS3/0GR12 (SUTURE) ×4
SUTURE V-LC BRB 180 2/0GR6GS22 (SUTURE) ×2 IMPLANT
SUTURE VLOC BRB 180 ABS3/0GR12 (SUTURE) ×2 IMPLANT
TOWEL OR 17X26 10 PK STRL BLUE (TOWEL DISPOSABLE) ×8 IMPLANT
TOWEL OR NON WOVEN STRL DISP B (DISPOSABLE) ×4 IMPLANT
TRAY FOLEY MTR SLVR 16FR STAT (SET/KITS/TRAYS/PACK) ×4 IMPLANT
TRAY LAPAROSCOPIC (CUSTOM PROCEDURE TRAY) ×8 IMPLANT
TROCAR ADV FIXATION 11X100MM (TROCAR) IMPLANT
TROCAR ADV FIXATION 5X100MM (TROCAR) ×4 IMPLANT
TROCAR BLADELESS OPT 5 100 (ENDOMECHANICALS) ×4 IMPLANT
TROCAR XCEL 12X100 BLDLESS (ENDOMECHANICALS) IMPLANT
TUBING INSUF HEATED (TUBING) ×4 IMPLANT
WATER STERILE IRR 1000ML POUR (IV SOLUTION) ×4 IMPLANT

## 2018-03-02 NOTE — Op Note (Signed)
Preoperative diagnosis:  1. Left renal mass   Postoperative diagnosis:  1. same   Procedure: 1. Robotic assisted laparoscopic left partial nephrectomy 2. Lysis of adhesions - 30 minutes  Surgeon: Ardis Hughs, MD 1st assistant: Debbrah Alar, PA-C Resident surgeon: Basilio Cairo, MD  Anesthesia: General  Complications: None  Intraoperative findings:  #1. Posterior hilar mass - frozen section at base of mass was negative for cancer #2. Warm ischemia time  minutes  EBL: 150cc  Specimens: left renal mass   Indication: [name] is a 72 y.o. patient with left renal mass.  After reviewing the management options for treatment, he elected to proceed with the above surgical procedure(s). We have discussed the potential benefits and risks of the procedure, side effects of the proposed treatment, the likelihood of the patient achieving the goals of the procedure, and any potential problems that might occur during the procedure or recuperation. Informed consent has been obtained.  Description of procedure: An assistant was required for this surgical procedure.  The duties of the assistant included but were not limited to suctioning, passing suture, camera manipulation, retraction. This procedure would not be able to be performed without an Environmental consultant.   The patient was taken to the operating room and a general anesthetic was administered. The patient was given preoperative antibiotics, placed in the right modified flank position with care to pad all potential pressure points, and prepped and draped in the usual sterile fashion. Next a preoperative timeout was performed.  A site was selected on the left side of the umbilicus for placement of the camera port. This was placed using a standard modified Hassan technique with entry into the peritoneum with a  8 mm tocar. We entered the peritoneum without incident and established pneumoperitoneum. The camera was then used to inspect  the abdomen and there was no evidence of any intra-abdominal injuries or other abnormalities.  I then spent the next 30 minutes taking down adhesions of the omentum from the anterior abdominal wall to allow port placement.  This was done sharply.  The remaining abdominal ports were then placed. 8 mm robotic ports were placed in the left upper quadrant, left lower quadrant, and left lateral abdominal wall, making a soft J. A 12 mm port was placed in the upper midline for laparoscopic assistance. All ports were placed under direct vision without difficulty. The surgical cart was then docked.   Utilizing the cautery scissors, the white line of Toldt was incised allowing the colon to be mobilized medially and the plane between the mesocolon and the anterior layer of Gerota's fascia to be developed and the kidney to be exposed. The ureter and gonadal vein were identified inferiorly and the ureter was lifted anteriorly off the psoas muscle. Dissection proceeded superiorly along the gonadal vein until the renal vein was identified. The renal hilum was then carefully isolated with a combination of blunt and sharp dissection allowing the renal arterial and venous structures to be separated and isolated in preparation for renal hilar vessel clamping.  Attention turned to the kidney and the perinephric fat surrounding the renal mass was removed and the kidney was mobilized sufficiently for exposure and resection of the renal mass. This required the kidney to be flipped completely so that I could access the posterior aspect of the hilum.    Once the renal mass was properly isolated, preparations were made for resection of the tumor. The renal artery was then clamped with a bulldog clamp. There were two  renal arteries that we clamped.   The tumor was then excised with cold scissor dissection along with an adequate visible gross margin of normal renal parenchyma. The tumor appeared to be excised without any  gross violation of the tumor. The renal collecting system was not entered during removal of the tumor. A running 3-0 V-lock suture was then brought through the capsule of the kidney and run along the base of the renal defect to provide hemostasis and close any entry into the renal collecting system if present. Weck clips were used to secure this suture outside the renal capsule at the proximal and distal ends. The bulldog clamps were then removed from the renal hilar vessel. A running 2-0 V lock suture was then used to close the capsule of the kidney using a sliding clip technique which resulted in excellent hemostasis. An additional hemostatic agent (Surgiflo) was then placed into the renal defect.   Total warm renal ischemia time was 21 minutes. The renal tumor resection site was examined. Hemostasis appeared adequate.   The kidney was placed back into its normal anatomic position and covered with perinephric fat as needed. A # 41 Blake drain was then brought through the lateral lower port site and positioned in the perinephric space. It was secured to the skin with a nylon suture. The surgical robotic cart was undocked. The renal tumor specimen was removed intact within an endopouch retrieval bag via the camera port sites. The camera port site and the other 12 mm port site were then closed at the fascial level with 0-vicryl suture. All other laparoscopic/robotic ports were removed under direct vision and the pneumoperitoneum let down with inspection of the operative field performed and hemostasis again confirmed. All incision sites were then injected with local anesthetic and reapproximated at the skin level with 4-0 monocryl subcuticular closures. Dermabond was applied to the skin. The patient tolerated the procedure well and without complications.   The patient was then repositioned for ventral hernia repair which is being performed by Dr. Johney Maine.   Ardis Hughs, M.D.

## 2018-03-02 NOTE — Transfer of Care (Signed)
Immediate Anesthesia Transfer of Care Note  Patient: [name] [facility]  Procedure(s) Performed: LAPAROSCOPIC REPAIR OF INCARCERATED INCISIONAL HERNIA WITH LYSIS OF ADHESIONS (N/A Abdomen) XI ROBOTIC ASSITED LEFT PARTIAL NEPHRECTOMY WITH LYSIS OF ADHESIONS X30 MINUTES. (Left Abdomen)  Patient Location: PACU  Anesthesia Type:General  Level of Consciousness: awake, alert  and oriented  Airway & Oxygen Therapy: Patient Spontanous Breathing and Patient connected to face mask oxygen  Post-op Assessment: Report given to RN and Post -op Vital signs reviewed and stable  Post vital signs: Reviewed and stable  Last Vitals:  Vitals Value Taken Time  BP    Temp    Pulse    Resp    SpO2      Last Pain:  Vitals:   03/02/18 0652  TempSrc:   PainSc: 2       Patients Stated Pain Goal: 4 (83/66/29 4765)  Complications: No apparent anesthesia complications

## 2018-03-02 NOTE — Progress Notes (Signed)
Prior to leaving PACU, pt reported pain as 6, returned to bay and monitors, medicated.

## 2018-03-02 NOTE — H&P (Signed)
[name]  DOB: 09-May-1946    History of Present Illness  ` Patient sent for surgical consultation at the request of Dr. Louis Meckel, [facility]  Chief Complaint: Recurrent incisional hernia. Request for hernia repair at time of partial nephrectomy  The patient is a pleasant gentleman who had had a prior open Nissen repair done over 4 decades ago. He had recurrent hiatal hernia. I did laparoscopic lysis lesions and redo repair 10 years ago in 2009. He had a small central abdominal hernia that I primarily repaired at the time of surgery. We'll recently was found to have a kidney mass. Left upper pole. Rather exophytic. Suspicious for neoplasm by MRI. Urology consulted. a few other renal lesions noted as well but less suspicious. Felt to benefit from partial nephrectomy. Evidence of recurrent incisional hernia. Request for consideration of repeat repair at the timing of his robotic partial nephrectomy.  Patient notes that the lump in his upper abdomen came back a few years after the hilar hernia repair. He thinks it's gradually gotten larger and is out all the time now. No major discomfort at the hernia. He moves his bowels twice a day. Denies any heartburn or reflux. Used to do 15 minute walks a few times a day. However he's been a little wiped out with adjustment in his blood pressure medications so backed off. Some energy back since think that his blood pressure meds back down. No fevers or chills. No nausea or vomiting. No hematochezia or melena. He does not smoke. He is not a diabetic. He is on Plavix for prior stenting and coronary disease over a decade ago  (Review of systems as stated in this history (HPI) or in the review of systems. Otherwise all other 12 point ROS are negative) ` ` `   Past Surgical History [name]; 11/27/2017 11:28 AM) Appendectomy  Cataract Surgery  Bilateral. Oral Surgery  Shoulder Surgery  Right. Spinal Surgery  - Lower Back  Spinal Surgery - Neck   Diagnostic Studies History [name]; 11/27/2017 11:28 AM) Colonoscopy  1-5 years ago  Allergies [name]; 11/27/2017 11:29 AM) No Known Drug Allergies [11/27/2017]: Allergies Reconciled   Medication History [name]; 11/27/2017 11:31 AM) Bisoprolol-Hydrochlorothiazide (10-6.25MG  Tablet, Oral) Active. Bisoprolol-Hydrochlorothiazide (5-6.25MG  Tablet, Oral) Active. Cephalexin (500MG  Capsule, Oral) Active. Finasteride (5MG  Tablet, Oral) Active. Clopidogrel Bisulfate (75MG  Tablet, Oral) Active. Medications Reconciled  Social History [name]; 11/27/2017 11:28 AM) Alcohol use  Remotely quit alcohol use. Caffeine use  Coffee. Illicit drug use  Remotely quit drug use. Tobacco use  Former smoker.  Family History [name]; 11/27/2017 11:28 AM) Colon Cancer  Brother. Family history unknown  First Degree Relatives  Heart Disease  Brother, Father, Mother. Heart disease in male family member before age 61   Other Problems [name]; 11/27/2017 11:28 AM) Alcohol Abuse  Anxiety Disorder  Arthritis  Asthma  Back Pain  Cancer  Chronic Obstructive Lung Disease  Depression  Diabetes Mellitus  Emphysema Of Lung  Gastric Ulcer  Gastroesophageal Reflux Disease  Hemorrhoids  High blood pressure  Hypercholesterolemia  Transfusion history     Review of Systems [name]; 11/27/2017 11:29 AM) General Present- Weight Gain. Not Present- Appetite Loss, Chills, Fatigue, Fever, Night Sweats and Weight Loss. HEENT Present- Ringing in the Ears and Seasonal Allergies. Not Present- Earache, Hearing Loss, Hoarseness, Nose Bleed, Oral Ulcers, Sinus Pain, Sore Throat, Visual Disturbances, Wears glasses/contact lenses and Yellow Eyes. Respiratory Present- Snoring. Not Present- Bloody sputum, Chronic Cough, Difficulty Breathing and  Wheezing. Breast Not Present- Breast Mass, Breast Pain, Nipple  Discharge and Skin Changes. Male Genitourinary Present- Impotence. Not Present- Blood in Urine, Change in Urinary Stream, Frequency, Nocturia, Painful Urination, Urgency and Urine Leakage. Musculoskeletal Present- Back Pain, Joint Pain and Muscle Weakness. Not Present- Joint Stiffness, Muscle Pain and Swelling of Extremities. Psychiatric Present- Anxiety and Depression. Not Present- Bipolar, Change in Sleep Pattern, Fearful and Frequent crying. Hematology Present- Blood Thinners and Easy Bruising. Not Present- Excessive bleeding, Gland problems, HIV and Persistent Infections.  Vitals [name]; 11/27/2017 11:30 AM) 11/27/2017 11:30 AM Weight: 199.13 lb Height: 67in Body Surface Area: 2.02 m Body Mass Index: 31.19 kg/m  Temp.: 98.83F(Oral)  Pulse: 64 (Regular)  BP: 132/82 (Sitting, Left Arm, Standard)     BP (!) 150/95   Pulse 68   Temp 98.1 F (36.7 C) (Oral)   Resp 18   Ht 5\' 7"  (1.702 m)   Wt 87.1 kg   SpO2 100%   BMI 30.07 kg/m   General: Pt awake/alert/oriented x4 in no major acute distress Eyes: PERRL, normal EOM. Sclera nonicteric Neuro: CN II-XII intact w/o focal sensory/motor deficits. Lymph: No head/neck/groin lymphadenopathy Psych:  No delerium/psychosis/paranoia HENT: Normocephalic, Mucus membranes moist.  No thrush Neck: Supple, No tracheal deviation Chest: No pain.  Good respiratory excursion. CV:  Pulses intact.  Regular rhythm MS: Normal AROM mjr joints.  No obvious deformity Abdomen: Soft, Nondistended.  Upper abdominal wall hernia.  Reducible.  Nontender.  No incarcerated hernias. Ext:  SCDs BLE.  No significant edema.  No cyanosis Skin: No petechiae / purpura   Assessment & Plan Adin Hector MD; 11/27/2017 4:04 PM) INCISIONAL HERNIA, WITHOUT OBSTRUCTION OR GANGRENE (K43.2) Impression: Recurrent incisional hernia near his prior open cholecystectomy and midline incisions. Because it is not larger, and make sense to get it repaired. I  would use mesh this time since suture repair failed.  Makes sense to coordinate at the same time of his left partial versus total nephrectomy by Dr [facility]. Most likely could expand that hernia for to be the nephrectomy extraction incision to avoid creating a new hernia somewhere else. Then do mesh underlay repair this time since stitches failed the last time. I think his perioperative risk from hernia surgery standpoint are low. The main concern would be discomfort. Use enhanced recovery protocol and use sex peripheral field block to minimize at soreness. He is an obese but very thin abdominal wall. Should their be concerns of contamination, may transition to a more absorbable mesh but usually long-term outcomes are inferior. risk of mesh infection or contamination rather unlikely with just anephrectomy as compared to the prior digestive tract surgery & gastrotomy repair.  Looks like he has pretty decent exercise tolerance although he is a little fatigued with change in his blood pressure medicine. Because of his prior coronary disease on anticoagulation, he received cardiac clearance.    The anatomy & physiology of the abdominal wall was discussed. The pathophysiology of hernias was discussed. Natural history risks without surgery including progeressive enlargement, pain, incarceration, & strangulation was discussed. Contributors to complications such as smoking, obesity, diabetes, prior surgery, etc were discussed.  I feel the risks of no intervention will lead to serious problems that outweigh the operative risks; therefore, I recommended surgery to reduce and repair the hernia. I explained laparoscopic techniques with possible need for an open approach. I noted the probable use of mesh to patch and/or buttress the hernia repair  Risks such as bleeding,  infection, abscess, need for further treatment, heart attack, death, and other risks were discussed. I noted a good likelihood  this will help address the problem. Goals of post-operative recovery were discussed as well. Possibility that this will not correct all symptoms was explained. I stressed the importance of low-impact activity, aggressive pain control, avoiding constipation, & not pushing through pain to minimize risk of post-operative chronic pain or injury. Possibility of reherniation especially with smoking, obesity, diabetes, immunosuppression, and other health conditions was discussed. We will work to minimize complications.  An educational handout further explaining the pathology & treatment options was given as well. Questions were answered. The patient expresses understanding & wishes to proceed with surgery.  Adin Hector, MD, FACS, MASCRS Gastrointestinal and Minimally Invasive Surgery    [address], [place], [place] [address] [phone] Main / Paging [phone] Fax

## 2018-03-02 NOTE — Anesthesia Procedure Notes (Signed)
Arterial Line Insertion Start/End9/20/2019 7:10 AM, 03/02/2018 7:20 AM Performed by: Murvin Natal, MD, [place] Ledora Delker C, CRNA, CRNA  Patient location: Pre-op. Preanesthetic checklist: patient identified, IV checked, site marked, risks and benefits discussed, surgical consent, monitors and equipment checked, pre-op evaluation and timeout performed Lidocaine 1% used for infiltration and patient sedated Right, radial was placed Catheter size: 20 G Hand hygiene performed , maximum sterile barriers used  and Seldinger technique used Allen's test indicative of satisfactory collateral circulation Attempts: 2 Procedure performed without using ultrasound guided technique. Ultrasound Notes:anatomy identified Following insertion, Biopatch and dressing applied. Post procedure assessment: normal  Patient tolerated the procedure well with no immediate complications. Additional procedure comments: Placed by Viann Fish, SRNA.

## 2018-03-02 NOTE — Anesthesia Postprocedure Evaluation (Signed)
Anesthesia Post Note  Patient: [name] [facility]  Procedure(s) Performed: LAPAROSCOPIC REPAIR OF INCARCERATED INCISIONAL HERNIA WITH LYSIS OF ADHESIONS (N/A Abdomen) XI ROBOTIC ASSITED LEFT PARTIAL NEPHRECTOMY WITH LYSIS OF ADHESIONS X30 MINUTES. (Left Abdomen)     Patient location during evaluation: PACU Anesthesia Type: General Level of consciousness: awake and alert Pain management: pain level controlled Vital Signs Assessment: post-procedure vital signs reviewed and stable Respiratory status: spontaneous breathing, nonlabored ventilation, respiratory function stable and patient connected to nasal cannula oxygen Cardiovascular status: blood pressure returned to baseline and stable Postop Assessment: no apparent nausea or vomiting Anesthetic complications: no    Last Vitals:  Vitals:   03/02/18 1541 03/02/18 1545  BP: (!) 155/86 (!) 153/93  Pulse: 64 67  Resp: 12 11  Temp: (!) 36.2 C   SpO2: 100% 100%    Last Pain:  Vitals:   03/02/18 1605  TempSrc:   PainSc: 3                  Ryan P Ellender

## 2018-03-02 NOTE — Interval H&P Note (Signed)
History and Physical Interval Note:  03/02/2018 6:56 AM  [name]  has presented today for surgery, with the diagnosis of recurrent incisional hernia  The various methods of treatment have been discussed with the patient and family. After consideration of risks, benefits and other options for treatment, the patient has consented to  Procedure(s): [place] (N/A) XI ROBOTIC ASSITED LEFT PARTIAL NEPHRECTOMY (Left) as a surgical intervention .  The patient's history has been reviewed, patient examined, no change in status, stable for surgery.  I have reviewed the patient's chart and labs.  Questions were answered to the patient's satisfaction.    I have re-reviewed the the patient's records, history, medications, and allergies.  I have re-examined the patient.  I again discussed intraoperative plans and goals of post-operative recovery.  The patient agrees to proceed.  [place]  Mar 18, 1946 [id number]  Patient Care Team: [name], MD as PCP - General (Internal Medicine) Dorothy Spark, MD as PCP - Cardiology (Cardiology) Kathie Rhodes, MD as Consulting Physician (Urology) Warden Fillers, MD as Consulting Physician (Optometry) Larey Dresser, MD as Consulting Physician (Cardiology) Laurence Spates, MD as Consulting Physician (Gastroenterology) Deneise Lever, MD as Consulting Physician (Pulmonary Disease) Dorothy Spark, MD as Consulting Physician (Cardiology) Michael Boston, MD as Consulting Physician (General Surgery) Ardis Hughs, MD as Attending Physician (Urology) Laurence Spates, MD as Consulting Physician (Gastroenterology)  Patient Active Problem List   Diagnosis Date Noted  . Other abnormal glucose 02/15/2018  . Anxiety 08/08/2017  . Aortic atherosclerosis ([place]) 04/28/2017  . OSA and COPD overlap syndrome ([place]) 09/14/2015  . Obesity (BMI 30.0-34.9) 03/11/2015  . Encounter for Medicare  annual wellness exam 03/11/2015  . Rhinitis, nonallergic, chronic 07/01/2014  . Vitamin D deficiency 07/25/2013  . Medication management 07/25/2013  . Hyperlipidemia 01/13/2009  . Cluster headache syndrome 01/13/2009  . Essential hypertension 01/13/2009  . Coronary atherosclerosis 01/13/2009  . COPD mixed type ([place]) 01/13/2009  . GERD 01/13/2009  . Lumbago, Chronic 01/13/2009  . PUD 01/13/2009  . BPH/Prostatism 01/13/2009    Past Medical History:  Diagnosis Date  . Anxiety    treated /w Xanax for mild depression , using 2 times per day, not PRN  . Asthma   . Benign prostatic hypertrophy   . CAD (coronary artery disease)    pt last left heart cath was in Nov 2008. EF was 55% on left ventriculogram. Circumflex was totally occluded after the 1st obtuse marginal with collaterals supplying the distal circumflex. LAD showed luminal irregularities. The stent in LAD was patent. The RCA showed luminal irregularities. The stent in th emid RCA and PDA were patent. There were no inverventions.   . 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 Chest pain    from anxiety last time 1 month ago  . Chronic obstructive pulmonary disease (HCC)    asthma  . Cluster headache    relative to sinus problems none recnt  . Constipation   . Depression   . DM2 (diabetes mellitus, type 2) ([place])    well with diet and exercise controlled  . GERD (gastroesophageal reflux disease)    hiatal hernia. Patient did have a Nissen fundoplication rare  . Heart murmur    heard 30 yrs ago  . History of blood transfusion    need for Bld. transfusion relative to taking NSAIDS  . HTN (hypertension)  pt. followed by [facility], last cardiac visit 2012, one yr. ago  . Hyperlipidemia   . Joint pain   . Lactose intolerance   . Left kidney mass   . Low back pain    chronic  . 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 Obesity   . OSA and COPD overlap syndrome ([place]) 02/25/2015   no cpap used pt denies sleep apnea  . Osteoarthritis   . Peptic ulcer disease   . Pneumonia not recent per pt  . Sleep concern    states the study (2003 )was done at [facility] it failed & he was told to return & he never did. Pt. states he has been found by prev. hosp. staff that he has to be told when to breathe & his wife does the same.   Marland Kitchen Spinal fracture    hx of traumatic in Jun 04, 2007 after falling off the roof  . Tinnitus, bilateral   . Vitamin D deficiency     Past Surgical History:  Procedure Laterality Date  . BACK SURGERY     x3- last surgery lumbar- 1998- / fusion   . blepheroplasty     both eyesx2  . C5-T6 posterior fusion     with [place] and radius screws  . [place], 2008 & 2012 multiple stents  total 4 times  . cataracts     cataracts removed- /w IOL - both eyes   . CERVICAL SPINE SURGERY    . COLONOSCOPY    . ESOPHAGEAL MANOMETRY    . HARDWARE REMOVAL  02/20/2012   Procedure: HARDWARE REMOVAL;  Surgeon: [name], MD;  Location: [facility];  Service: Neurosurgery;  Laterality: N/A;  Hardware Removal  . HIATAL HERNIA REPAIR  1979, spring 2009  . LAPAROSCOPIC CHOLECYSTECTOMY     & IOC  . multiple percutaneous coronary interventions    . NASAL SINUS SURGERY     x2, sees Dr. [name], still having problems, states he uses Benadryl PRN- up to 5 times per day   . right temporal artery biopsy    . right wrist plate insertion  [id number]/[id number]  . Rotator cuff surgery Right   . SKIN BIOPSY Right 10/31/2017   Chest  . TRANSURETHRAL RESECTION OF BLADDER TUMOR N/A 11/03/2017   Procedure: cystoscopy;  Surgeon: Kathie Rhodes, MD;  Location: [facility];  Service: Urology;  Laterality: N/A;  . UMBILICAL HERNIA REPAIR    . UPPER GI ENDOSCOPY      Social History   Socioeconomic History  . Marital status: Married    Spouse name: Not on file  . Number of children: Not on file  .  Years of education: Not on file  . Highest education level: Not on file  Occupational History  . Occupation: Retired  Scientific laboratory technician  . Financial resource strain: Not on file  . Food insecurity:    Worry: Not on file    Inability: Not on file  . Transportation needs:    Medical: Not on file    Non-medical: Not on file  Tobacco Use  . Smoking status: Former Smoker    Packs/day: 1.00    Years: 20.00    Pack years: 20.00    Types: Cigarettes    Last attempt to quit: 02/14/1978    Years since quitting: 40.0  . Smokeless tobacco: Never Used  Substance  and Sexual Activity  . Alcohol use: Not Currently    Alcohol/week: 0.0 standard drinks    Comment: rare  . Drug use: No  . Sexual activity: Not on file  Lifestyle  . Physical activity:    Days per week: Not on file    Minutes per session: Not on file  . Stress: Not on file  Relationships  . Social connections:    Talks on phone: Not on file    Gets together: Not on file    Attends religious service: Not on file    Active member of club or organization: Not on file    Attends meetings of clubs or organizations: Not on file    Relationship status: Not on file  . Intimate partner violence:    Fear of current or ex partner: Not on file    Emotionally abused: Not on file    Physically abused: Not on file    Forced sexual activity: Not on file  Other Topics Concern  . Not on file  Social History Narrative  . Not on file    Family History  Problem Relation Age of Onset  . Heart failure Mother        in her 85s  . Hypertension Mother   . Heart attack Mother   . Obesity Mother   . Coronary artery disease Father        developed in his 110s  . Hypertension Father   . Heart attack Father   . Heart attack Brother        in there 5s  . Heart attack Brother        in there 42s  . Gout Sister     Medications Prior to Admission  Medication Sig Dispense Refill Last Dose  . acetaminophen (TYLENOL) 500 MG tablet Take 1 tablet  (500 mg total) by mouth every 6 (six) hours as needed. (Patient taking differently: Take 500-1,000 mg by mouth every 6 (six) hours as needed (for pain.). ) 30 tablet 0 03/01/2018 at Unknown time  . ALPHA-LIPOIC ACID PO Take 1 capsule by mouth 2 (two) times daily.   03/01/2018 at Unknown time  . Ascorbic Acid (VITAMIN C) 1000 MG tablet Take 1,000 mg by mouth 2 (two) times daily.    03/01/2018 at Unknown time  . aspirin EC 81 MG tablet Take 81 mg by mouth every evening.    02/23/2018  . B Complex Vitamins (B COMPLEX PO) Take 1 capsule by mouth daily.    03/01/2018 at Unknown time  . bisoprolol-hydrochlorothiazide (ZIAC) 10-6.25 MG tablet Take 1 tablet by mouth daily. 90 tablet 3 03/01/2018 at Unknown time  . buPROPion (WELLBUTRIN XL) 150 MG 24 hr tablet Take 1 tablet (150 mg total) by mouth daily. 90 tablet 1 03/01/2018 at Unknown time  . Choline Dihydrogen Citrate (CHOLINE CITRATE) 650 MG TABS Take 650 mg by mouth daily.    03/01/2018 at Unknown time  . clopidogrel (PLAVIX) 75 MG tablet Take 1 tablet (75 mg total) by mouth daily. 90 tablet 1 02/23/2018  . diazepam (VALIUM) 5 MG tablet Take 1/2-1 tablet 2 - 3 x /day ONLY if needed for Anxiety Attack &  limit to 5 days /week to avoid addiction 90 tablet 0 03/01/2018 at Unknown time  . finasteride (PROSCAR) 5 MG tablet Take 5 mg by mouth daily.  6 02/16/2018  . fish oil-omega-3 fatty acids 1000 MG capsule Take 1 g by mouth 2 (two) times daily.  03/01/2018 at Unknown time  . Flaxseed, Linseed, (FLAX SEED OIL) 1000 MG CAPS Take 1,000 mg by mouth 2 (two) times daily.    03/01/2018 at Unknown time  . folic acid (FOLVITE) 497 MCG tablet Take 800 mcg by mouth at bedtime.    03/01/2018 at Unknown time  . ipratropium (ATROVENT) 0.03 % nasal spray Place 1-2 sprays into both nostrils daily as needed (for allergies.).    03/02/2018 at 0400  . Lecithin 1200 MG CAPS Take 1,200 mg by mouth 2 (two) times daily.   03/01/2018 at Unknown time  . Magnesium 500 MG TABS Take 1,500 mg by  mouth at bedtime.    03/01/2018 at Unknown time  . Melatonin 10 MG TABS Take 30 mg by mouth at bedtime.   03/01/2018 at Unknown time  . meloxicam (MOBIC) 15 MG tablet Take 1/2 to 1 tablet daily with food for pain & inflammation - limit to 4 tablets /week to prevent Kidney Damage 90 tablet 0 02/23/2018 at Unknown time  . Multiple Vitamin (MULTIVITAMIN WITH MINERALS) TABS tablet Take 1 tablet by mouth daily.   03/01/2018 at Unknown time  . niacin 500 MG tablet Take 500 mg by mouth 2 (two) times daily.   03/01/2018 at Unknown time  . nitroGLYCERIN (NITROSTAT) 0.4 MG SL tablet Place 0.4 mg under the tongue every 5 (five) minutes x 3 doses as needed for chest pain.   02/16/2018  . tamsulosin (FLOMAX) 0.4 MG CAPS capsule Take 1 capsule daily for Prostate 90 capsule 1 03/02/2018 at 0400  . tetrahydrozoline-zinc (VISINE-AC) 0.05-0.25 % ophthalmic solution Place 1-2 drops into both eyes 3 (three) times daily as needed (for redness/irritation.).   Past Week at Unknown time  . Turmeric 500 MG CAPS Take 1,500 mg by mouth 2 (two) times daily.    03/01/2018 at Unknown time  . VALERIAN PO Take 1,200 mg by mouth at bedtime. 400 mg tablets   03/01/2018 at Unknown time  . Blood Glucose Monitoring Suppl (FREESTYLE FREEDOM LITE) w/Device KIT Check blood sugar 1 time a day. DX-E11.21 1 each 0 02/23/2018  . glucose blood (FREESTYLE LITE) test strip Check blood sugar 1 time a day.  DX-E11.21 100 each 12 02/23/2018    Current Facility-Administered Medications  Medication Dose Route Frequency Provider Last Rate Last Dose  . bisoprolol (ZEBETA) tablet 10 mg  10 mg Oral Once Belinda Block, MD      . ceFAZolin (ANCEF) 2-4 GM/100ML-% IVPB           . lactated ringers infusion   Intravenous Continuous Belinda Block, MD         Allergies  Allergen Reactions  . Codeine Itching and Other (See Comments)    Also hallucinations   . Morphine And Related Other (See Comments)    Hallucinations  . Nsaids Other (See Comments)     BLEEDING--naprosyn  . Oxycodone     Hallucinations  . Crestor [Rosuvastatin] Other (See Comments)    Soreness/aching  . Cymbalta [Duloxetine Hcl] Other (See Comments)    Pt is unsure of reaction type  . Dilaudid [Hydromorphone Hcl] Itching and Other (See Comments)    Hallucinations.  . Effexor [Venlafaxine] Other (See Comments)    Unsure of reactions type  . Gluten Meal Other (See Comments)    Runny nose (constant); bloating.  . Topamax [Topiramate] Other (See Comments)    Caused visual impairments/swelling in eyes--pinch off optic nerve (temporary blindness)  . Trazodone And Nefazodone Other (See Comments)  Unsure of reaction type  . Adhesive [Tape] Other (See Comments)    SKIN PEELS--PAPER TAPE IS OKAY  . Penicillins Rash  . Sulfa Antibiotics Swelling, Rash and Other (See Comments)    Mouth sores    BP (!) 150/95   Pulse 68   Temp 98.1 F (36.7 C) (Oral)   Resp 18   Ht 5' 7"  (1.702 m)   Wt 87.1 kg   SpO2 100%   BMI 30.07 kg/m   Labs: Results for orders placed or performed during the hospital encounter of 03/02/18 (from the past 48 hour(s))  Glucose, capillary     Status: Abnormal   Collection Time: 03/02/18  6:35 AM  Result Value Ref Range   Glucose-Capillary 69 (L) 70 - 99 mg/dL    Imaging / Studies: No results found.   Adin Hector, M.D., F.A.C.S. Gastrointestinal and Minimally Invasive Surgery [facility] [address] [place], [place] [address] [phone] Main / Paging  03/02/2018 6:56 AM    Adin Hector

## 2018-03-02 NOTE — Anesthesia Procedure Notes (Addendum)
Procedure Name: Intubation Date/Time: 03/02/2018 7:55 AM Performed by: [place] Johndavid Geralds C, CRNA Pre-anesthesia Checklist: Patient identified, Emergency Drugs available, Suction available, Patient being monitored and Timeout performed Patient Re-evaluated:Patient Re-evaluated prior to induction Oxygen Delivery Method: Circle system utilized Preoxygenation: Pre-oxygenation with 100% oxygen Induction Type: IV induction Ventilation: Mask ventilation without difficulty Laryngoscope Size: Glidescope and 4 Grade View: Grade I Tube type: Oral Tube size: 7.5 mm Number of attempts: 1 Airway Equipment and Method: Patient positioned with wedge pillow and Video-laryngoscopy Placement Confirmation: ETT inserted through vocal cords under direct vision,  positive ETCO2 and breath sounds checked- equal and bilateral Secured at: 23 cm Tube secured with: Tape Dental Injury: Teeth and Oropharynx as per pre-operative assessment  Difficulty Due To: Difficult Airway-  due to neck instability Comments: Intubated by Viann Fish, SRNA. Elective glidescope used due to cervical hardware.

## 2018-03-02 NOTE — H&P (Signed)
Pt presents today for pre-operative history and physical exam in anticipation of robotic assisted left partial nephrectomy on 03/02/18 by Dr. Louis Meckel. Dr. Johney Maine plans to do a ventral hernia repair immediately following the kidney procedure.   Cardiologist is Dr. Meda Coffee but he has not received pre-operative clearance yet.    Pt denies F/C, HA, CP, SOB, N/V, diarrhea/constipation, flank pain, hematuria, and dysuria.     CC: Renal Mass Surveillance  HPI: [name] is a 72 year-old male established patient who is here ongoing eval and management of a renal mass.  The patient has a history of heart attack and has 4 stents. These were placed nearly 20 years ago.   Imaging clearly delineates an enhancing renal mass in the medial aspect of the upper pole of the left kidney. There are 2 other tumors on the left that are poorly characterized due to artifact and no significant enhancement.   The mass is on the left side.   The mass was seen on CT Scan.   The mass was last imaged 1 month ago. The area was described as 2.6cm left upper pole medially on the posterior aspect.   He has had no symptoms. He has not seen blood in his urine. He does have a good appetite. He is not having pain in new locations. He has not recently had unwanted weight loss.   He has had previous abdominal surgery. His past surgical history includes: Numerous open Nissen and Ventral hernia repair surgerys. The patient can walk a flight of steps.   The patient's past medical history is significant for heart attack. There is not a a family history of kidney cancer. There is no family history of brain tumors (AMLs), seizures or brain aneurysm's.     ALLERGIES: Adhesive tape - Skin Rash Codeine Derivatives - Other Reaction, hallucination crestor - Other Reaction, muscle pain Cymbalta - Other Reaction, ineffective Dilaudid - Other Reaction, hallucination Effexor - Other Reaction, unknown Gluten Meal - Other Reaction,  runny nose Morphine Sulfate - Other Reaction, hallucination NSAIDs - Other Reaction, GI Bleed Penicillins - Skin Rash, Itching Percocet - Other Reaction, hallucination Sulfa Antibiotics - Skin Rash, Itching Topamax - Other Reaction, visual changes "blind" Trazodone and Nefazodone - Other Reaction, Unknown    MEDICATIONS: Finasteride 5 mg tablet 1 tablet PO Daily  Plavix 75 mg tablet  Tamsulosin Hcl 0.4 mg capsule  Alpha Lipoic Acid  Aspirin Ec 81 mg tablet, delayed release  B Complex  Bisoprolol-Hydrochlorothiazide 5 mg-6.25 mg tablet  Choline Citrate 650 mg tablet  Fish Oil-Omega-3-Vit D  Flax Seed Oil 9,628 mg capsule  Folic Acid  Lecithin  Magnesium 500 mg capsule  Melatonin  Meloxicam  Multiple Vitamin  Niacin 500 mg tablet capsule  Nitroglycerin 0.4 mg tablet, sublingual  Turmeric 500 mg capsule  Tylenol Extra Strength 500 mg tablet  Valerian  Vitamin C 1,000 mg tablet  Wellbutrin Xl     Notes: wellbutrin is being mailed to pt, he has not started this medication at this time. (02/20/2018)   GU PSH: Cystoscopy - 11/03/2017, 10/12/2017 Locm 300-399Mg /Ml Iodine,1Ml - 10/11/2017 Prostate Needle Biopsy - 2011      PSH Notes: Anterior Gastropexy For Hiatal Hernia x 2   3 ventral hernia repairs (no mesh)   green light laser of prostate approx 10 years ago   NON-GU PSH: Back surgery - 1998 Cardiac Stent Placement Carpal Tunnel Surgery.. Cataract Surgery.. Eye Surgery (Unspecified) Hernia Repair Neck Spine Fusion - 2009 Nose Surgery (Unspecified)  Stomach Surgery Procedure - 2008    GU PMH: Left renal neoplasm, Left, I am going to evaluate his kidneys more thoroughly with an MRI scan just because of all of the streak artifact present on his CT scan in order to be sure this is the only lesion and to characterize the lesion more thoroughly. - 10/12/2017 Renal calculus, Left, His left renal calculus is nonobstructing and peripherally located. This can be monitored. -  10/12/2017 Renal cyst, Left, The incidentally noted renal cysts are of no clinical significance. - 10/12/2017 Urinary Retention (Improving), He did not have a particularly obstructing prostate. He has had prior laser surgery of the prostate and his retention was clot retention. Since his urine is now clear I left his Foley catheter out today. - 10/12/2017 Gross hematuria (Acute), Culture urine. Empirically begin Cephalexin 500 mg 1 po BID X 7 days till culture complete. Begin Finasteride 5 mg 1 po daily. PSA/BUN/crea today. Will proceed with hematuria workup with CT and cysto w/Dr. 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 He then underwent surgery on his neck and at that time all of his prostate medications were stopped and it was felt he should be back on his 5 alpha reductase inhibitor but not an alpha-blocker because of the risk of dizziness and a possible fall with reinjury of his neck. It later turned out that his dizziness was being caused by Lyrica and when he decreased the dosage the dizziness resolved. He has been taking tamsulosin again but continues to have obstructive voiding symptoms consisting of nocturia 2-3 times as well as significant slowing of his urinary stream.   Elevated PSA: His PSA has been elevated in the past and do to a PSA elevation of 9.05 he underwent transrectal ultrasound and biopsy of the prostate on 11/24/04 which revealed BPH only. In 4/07 his PSA was as high as 10.98 but the last time  it was checked in our office, in 2/09, it was 3.21/22.7%. The PSA on 03/22/09 was found to be 15.37 and a repeat PSA on 04/01/09 was 15.67. He reports that he was taking DHEA and as soon as he heard the laboratory results he stopped taking that. He said he switch to [facility] which seems to be as effective at helping with his mood which is why he took the supplements in the first place. He reports he is not taking any supplements anymore.     NON-GU PMH: Asthma, Asthma - 2014 Personal history of other diseases of the circulatory system, History of cardiac disorder - 2014, History of hypertension, - 2014 Personal history of other endocrine, nutritional and metabolic disease, History of diabetes mellitus - 2014, History of hypercholesterolemia, - 2014 Anxiety Arthritis Chest pain, unspecified Chronic obstructive pulmonary disease with (acute) exacerbation Cluster headache syndrome, unspecified, intractable Constipation, unspecified Coronary Artery Disease Depression Diabetes Type 2 GERD Hypercholesterolemia Hyperlipidemia, unspecified Hypertension Lactose intolerance, unspecified Obesity Osteoarthritis Pain in unspecified joint Personal history of peptic ulcer disease Skin Cancer, History Sleep Apnea    FAMILY HISTORY: 1 Daughter -  Daughter Cardiac Failure - Father, Mother, Brother Diabetes - Grandmother Family Health Status Number - Runs In Family   SOCIAL HISTORY: Marital Status: Married Preferred Language: English; Ethnicity: Not Hispanic Or Latino; Race: White Current Smoking Status: Patient does not smoke anymore. Has not smoked since 06/13/1977. Smoked for 20 years. Smoked 2 packs per day.   Tobacco Use Assessment Completed: Used Tobacco in last 30 days? Does not use smokeless tobacco. Has never drank.  Does not use drugs. Drinks 3 caffeinated drinks per day. Has had a blood transfusion. Patient's occupation is/was retired.    REVIEW OF SYSTEMS:    GU Review Male:   Patient  denies frequent urination, hard to postpone urination, burning/ pain with urination, get up at night to urinate, leakage of urine, stream starts and stops, trouble starting your stream, have to strain to urinate , erection problems, and penile pain.  Gastrointestinal (Upper):   Patient denies nausea, vomiting, and indigestion/ heartburn.  Gastrointestinal (Lower):   Patient denies diarrhea and constipation.  Constitutional:   Patient denies fever, night sweats, weight loss, and fatigue.  Skin:   Patient denies skin rash/ lesion and itching.  Eyes:   Patient denies blurred vision and double vision.  Ears/ Nose/ Throat:   Patient denies sore throat and sinus problems.  Hematologic/Lymphatic:   Patient denies swollen glands and easy bruising.  Cardiovascular:   Patient denies leg swelling and chest pains.  Respiratory:   Patient denies cough and shortness of breath.  Endocrine:   Patient denies excessive thirst.  Musculoskeletal:   Patient reports back pain. Patient denies joint pain.  Neurological:   Patient denies headaches and dizziness.  Psychologic:   Patient reports depression. Patient denies anxiety.   VITAL SIGNS:      02/20/2018 02:02 PM  Weight 190 lb / 86.18 kg  Height 67 in / 170.18 cm  BP 133/81 mmHg  Pulse 67 /min  Temperature 97.7 F / 36.5 C  BMI 29.8 kg/m   MULTI-SYSTEM PHYSICAL EXAMINATION:    Constitutional: Well-nourished. No physical deformities. Normally developed. Good grooming.  Neck: Neck symmetrical, not swollen. Normal tracheal position.  Respiratory: Normal breath sounds. No labored breathing, no use of accessory muscles.   Cardiovascular: Regular rate and rhythm. No murmur, no gallop. Normal temperature, normal extremity pulses.   Lymphatic: No enlargement of neck, axillae, groin.  Skin: No paleness, no jaundice, no cyanosis. No lesion, no ulcer, no rash.  Neurologic / Psychiatric: Oriented to time, oriented to place, oriented to person. No depression, no  anxiety, no agitation.  Gastrointestinal: No mass, no tenderness, no rigidity, non obese abdomen.  Eyes: Normal conjunctivae. Normal eyelids.  Ears, Nose, Mouth, and Throat: Left ear no scars, no lesions, no masses. Right ear no scars, no lesions, no masses. Nose no scars, no lesions, no masses. Normal hearing. Normal lips.  Musculoskeletal: Normal gait and station of head and neck.     PAST DATA REVIEWED:  Source Of History:  Patient  Records Review:   Previous Patient Records  Urine Test Review:   Urinalysis   09/14/05  Hormones  Testosterone, Total 4.21     02/20/18  Urinalysis  Urine Appearance Clear   Urine Color Yellow   Urine Glucose Neg mg/dL  Urine Bilirubin Neg mg/dL  Urine Ketones Neg mg/dL  Urine Specific Gravity 1.020   Urine Blood Neg ery/uL  Urine pH 6.0   Urine Protein 2+ mg/dL  Urine Urobilinogen 0.2 mg/dL  Urine Nitrites Neg   Urine Leukocyte  Esterase Neg leu/uL  Urine WBC/hpf NS (Not Seen)   Urine RBC/hpf NS (Not Seen)   Urine Epithelial Cells 0 - 5/hpf   Urine Bacteria NS (Not Seen)   Urine Mucous Not Present   Urine Yeast NS (Not Seen)   Urine Trichomonas Not Present   Urine Cystals NS (Not Seen)   Urine Casts NS (Not Seen)   Urine Sperm Not Present    PROCEDURES:          Urinalysis w/Scope Dipstick Dipstick Cont'd Micro  Color: Yellow Bilirubin: Neg mg/dL WBC/hpf: NS (Not Seen)  Appearance: Clear Ketones: Neg mg/dL RBC/hpf: NS (Not Seen)  Specific Gravity: 1.020 Blood: Neg ery/uL Bacteria: NS (Not Seen)  pH: 6.0 Protein: 2+ mg/dL Cystals: NS (Not Seen)  Glucose: Neg mg/dL Urobilinogen: 0.2 mg/dL Casts: NS (Not Seen)    Nitrites: Neg Trichomonas: Not Present    Leukocyte Esterase: Neg leu/uL Mucous: Not Present      Epithelial Cells: 0 - 5/hpf      Yeast: NS (Not Seen)      Sperm: Not Present    ASSESSMENT:      ICD-10 Details  1 GU:   Benign Neo Kidney, Unspec - D30.00    PLAN:            Medications Stop Meds: Atrovent Hfa   Discontinue: 02/20/2018  - Reason: The medication cycle was completed.            Schedule Return Visit/Planned Activity: Keep Scheduled Appointment - Schedule Surgery          Document Letter(s):  Created for Patient: Clinical Summary         Notes:   There are no changes in the patients history or physical exam since last evaluation by Dr. Louis Meckel. Pt is scheduled to undergo robotic assisted left partial nephrectomy and ventral hernia repair by Dr. Johney Maine on 03/02/18.   Pt was instructed to contact his cardiologist for pre-operative clearance and to have that information forwarded to [name] and Dr. Johney Maine.   All pt's questions were answered to the best of my ability.

## 2018-03-02 NOTE — Op Note (Signed)
03/02/2018  PATIENT:  [name]  72 y.o. male  Patient Care Team: [name], MD as PCP - General (Internal Medicine) Dorothy Spark, MD as PCP - Cardiology (Cardiology) Kathie Rhodes, MD as Consulting Physician (Urology) Warden Fillers, MD as Consulting Physician (Optometry) Larey Dresser, MD as Consulting Physician (Cardiology) Laurence Spates, MD as Consulting Physician (Gastroenterology) Deneise Lever, MD as Consulting Physician (Pulmonary Disease) Dorothy Spark, MD as Consulting Physician (Cardiology) Michael Boston, MD as Consulting Physician (General Surgery) Ardis Hughs, MD as Attending Physician (Urology) Laurence Spates, MD as Consulting Physician (Gastroenterology)  PRE-OPERATIVE DIAGNOSIS:  Recurrent incisional hernia  POST-OPERATIVE DIAGNOSIS:    Recurrent incisional incarcerated hernia Diastasis recti   PROCEDURE:    LAPAROSCOPIC LYSIS OF ADHESIONS X 28 MINUTES LAPAROSCOPIC REPAIR OF INCARCERATED INCISIONAL HERNIA  INSERTION OF MESH  SURGEON:  Adin Hector, MD  ASSISTANT: Ave Filter, PA-S, [employer]    ANESTHESIA:     General  Nerve block provided with liposomal bupivacaine (Experel) mixed with 0.25% bupivacaine as a Bilateral TAP block x30 mL per side at the level of the transverse abdominis & preperitoneal spaces along the flank at the anterior axillary line, from subcostal ridge to iliac crest under laparoscopic guidance   Local anesthesia field block: (0.25% bupivacaine & liposomal  Bupivacaine [Experel]) around port sites  EBL:  Total I/O In: 2100 [I.V.:2100] Out: 980 [Urine:830; Blood:150]  Per anesthesia record  Delay start of Pharmacological VTE agent (>24hrs) due to surgical blood loss or risk of bleeding:  no  DRAINS: none   SPECIMEN:  No Specimen  DISPOSITION OF SPECIMEN:  N/A  COUNTS:  YES  PLAN OF CARE: Admit for overnight observation  PATIENT DISPOSITION:  PACU - hemodynamically  stable.  INDICATION: Pleasant patient has developed a recurrent ventral wall abdominal hernia.  Also found to have left renal mass suspicious for possible renal cell neoplasm or other concern.  Recommendation made for a partial nephrectomy by his urologist.  Desire for combination will repair of hernia at the same time requested.  Recommendation was made for surgical repair:  The anatomy & physiology of the abdominal wall was discussed. The pathophysiology of hernias was discussed. Natural history risks without surgery including progeressive enlargement, pain, incarceration & strangulation was discussed. Contributors to complications such as smoking, obesity, diabetes, prior surgery, etc were discussed.  I feel the risks of no intervention will lead to serious problems that outweigh the operative risks; therefore, I recommended surgery to reduce and repair the hernia. I explained laparoscopic techniques with possible need for an open approach. I noted the probable use of mesh to patch and/or buttress the hernia repair  Risks such as bleeding, infection, abscess, need for further treatment, heart attack, death, and other risks were discussed. I noted a good likelihood this will help address the problem. Goals of post-operative recovery were discussed as well. Possibility that this will not correct all symptoms was explained. I stressed the importance of low-impact activity, aggressive pain control, avoiding constipation, & not pushing through pain to minimize risk of post-operative chronic pain or injury. Possibility of reherniation especially with smoking, obesity, diabetes, immunosuppression, and other health conditions was discussed. We will work to minimize complications.  An educational handout further explaining the pathology & treatment options was given as well. Questions were answered. The patient expresses understanding & wishes to proceed with surgery.   OR FINDINGS: Supraumbilical hernia  incarcerated with omentum.  Moderate diastases recti with some laxity supraumbilically is  worse periumbilical hernia.  11 x 3 cm region.  Type of repair: Laparoscopic underlay repair   Placement of mesh: Intraperitoneal underlay repair  Name of mesh: Bard Ventralight dual sided (polypropylene / Seprafilm)  Size of mesh: 23x17cm  Orientation: Vertical  Mesh overlap:  5-7cm   DESCRIPTION:   Informed consent was confirmed.  Patient had already undergone robotically assisted left partial nephrectomy.  Please see Dr. Carlton Adam note for further details.  His ports had been removed and those small incisional port site wounds were covered and protected.  Patient was already under general anesthesia & had been repositioned supine.  Urology surgical drain in left lower quadrant was protected and covered.  Patient abdomen was reprepped and redraped in sterile fashion.  Surgical timeout confirmed our plan.  I placed a port through the supraumbilical prior incision from urology.  I induced insufflation.  Extra ports were carefully placed in the left abdomen under direct visualization.  Prior urology ports were used appropriate.  I could see adhesions on the parietal peritoneum under the abdominal wall.  I did laparoscopic lysis of adhesions to expose the entire anterior abdominal wall.  I primarily used focused sharp dissection.  Most was greater omentum and falciform ligament but there also were a few loops of small bowel densely adherent to supraumbilical diastases and hernias.  I carefully freed that off.  I did lyse adhesions to free the greater omentum off the small bowel and also to release a hairpin turn causing a subtle transition point in small bowel.  I did careful inspection to make sure there is no evidence of any serosal injury other abnormality. Rest of the intestine and abdomen was explored proving no other abnormalities.  I made sure hemostasis was good.    I mapped out the region using a  needle passer.   To ensure that I would have at least 5 cm radial coverage outside of the hernia defect, I chose a 23x17cm dual sided mesh.  I placed #1 Prolene stitches around its edge about every 5 cm = 10 total.  I rolled the mesh & placed into the peritoneal cavity through the supraumbilical 12 mm port site was near 1 of the hernia defects..  I unrolled the mesh and positioned it appropriately.  I secured the mesh to cover up the hernia defect using a laparoscopic suture passer to pass the tails of the Prolene through the abdominal wall & tagged them with clamps for good transfascial suturing.  I started out in four corners to make sure I had the mesh centered under the hernia defect appropriately, and then proceeded to work in quadrants.    We evacuated CO2 & desufflated the abdomen.  I tied the fascial stitches down. I closed the 69mm Urology port site defect supraumbilical with hernia using #1 PDS interrupted transverse stitches primarily.  I reinsufflated the abdomen. The mesh provided at least circumferential coverage around the entire region of hernia defects.   I secured the mesh centrally with an additional trans fascial stitch in & out the mesh using #1 PDS under laparoscopic visualization.   I also used another stitch to help close the epigastric incisional hernia that had been incarcerated with omentum.  I use that stitch to help tacked the mesh centrally as well.  In fascia and out mesh out fascia and tied down.  I tacked the edges & central part of the mesh to the peritoneum/posterior rectus fascia with SecureStrap absorbable tacks.  I used that to help  re-tacked the infraumbilical peritoneum to talk to the lower third of the mesh into the retrorectus/preperitoneal space..  I did reinspection. Hemostasis was good. Mesh laid well. I completed a broad field block of local anesthesia at fascial stitch sites & fascial closure areas.  TAP blocks had been done as well under direct laparoscopic  visualization.  Reinspection revealed no injury.  Greater omentum allowed to come down towards the suprapubic region and protect between bowel and dual sided mesh.  Capnoperitoneum was evacuated. Ports were removed. The skin was closed with Monocryl at the port sites and Steri-Strips on the fascial stitch puncture sites.  Patient is being extubated to go to the recovery room.  I discussed operative findings, updated the patient's status, discussed probable steps to recovery, and gave postoperative recommendations to the patient's spouse.  Recommendations were made.  Questions were answered.  She expressed understanding & appreciation.  Adin Hector, M.D., F.A.C.S. Gastrointestinal and Minimally Invasive Surgery [facility] [address] [place], [place] [address] [phone] Main / Paging  03/02/2018 2:17 PM

## 2018-03-02 NOTE — Progress Notes (Signed)
There are conflicting orders for ambulation for this patient.  Due to the severity of the possible bleeding risk due to partial nephrectomy and strict bedrest x 12 hours order per the urology surgeon, patient kept on strict bedrest for the first 12 hours of admittance to the floor.  [name]

## 2018-03-02 NOTE — Progress Notes (Signed)
PHARMACIST - PHYSICIAN ORDER COMMUNICATION  CONCERNING: P&T Medication Policy on Herbal Medications  DESCRIPTION:  This patient's order for:  choline  has been noted.  This product(s) is classified as an "herbal" or natural product. Due to a lack of definitive safety studies or FDA approval, nonstandard manufacturing practices, plus the potential risk of unknown drug-drug interactions while on inpatient medications, the Pharmacy and Therapeutics Committee does not permit the use of "herbal" or natural products of this type within [facility].   ACTION TAKEN: The pharmacy department is unable to verify this order at this time and your patient has been informed of this safety policy. Please reevaluate patient's clinical condition at discharge and address if the herbal or natural product(s) should be resumed at that time. Eudelia Bunch, Pharm.D [phone] 03/02/2018 4:50 PM

## 2018-03-03 ENCOUNTER — Encounter (HOSPITAL_COMMUNITY): Payer: Self-pay

## 2018-03-03 DIAGNOSIS — K430 Incisional hernia with obstruction, without gangrene: Secondary | ICD-10-CM | POA: Diagnosis not present

## 2018-03-03 DIAGNOSIS — M6208 Separation of muscle (nontraumatic), other site: Secondary | ICD-10-CM | POA: Diagnosis not present

## 2018-03-03 DIAGNOSIS — F329 Major depressive disorder, single episode, unspecified: Secondary | ICD-10-CM | POA: Diagnosis not present

## 2018-03-03 DIAGNOSIS — Z23 Encounter for immunization: Secondary | ICD-10-CM | POA: Diagnosis not present

## 2018-03-03 DIAGNOSIS — F419 Anxiety disorder, unspecified: Secondary | ICD-10-CM | POA: Diagnosis not present

## 2018-03-03 DIAGNOSIS — K660 Peritoneal adhesions (postprocedural) (postinfection): Secondary | ICD-10-CM | POA: Diagnosis not present

## 2018-03-03 LAB — GLUCOSE, CAPILLARY
GLUCOSE-CAPILLARY: 104 mg/dL — AB (ref 70–99)
GLUCOSE-CAPILLARY: 96 mg/dL (ref 70–99)
Glucose-Capillary: 101 mg/dL — ABNORMAL HIGH (ref 70–99)
Glucose-Capillary: 94 mg/dL (ref 70–99)

## 2018-03-03 LAB — BASIC METABOLIC PANEL
Anion gap: 12 (ref 5–15)
BUN: 23 mg/dL (ref 8–23)
CO2: 23 mmol/L (ref 22–32)
Calcium: 8.2 mg/dL — ABNORMAL LOW (ref 8.9–10.3)
Chloride: 100 mmol/L (ref 98–111)
Creatinine, Ser: 1.43 mg/dL — ABNORMAL HIGH (ref 0.61–1.24)
GFR calc Af Amer: 54 mL/min — ABNORMAL LOW (ref >60)
GFR, EST NON AFRICAN AMERICAN: 47 mL/min — AB (ref >60)
Glucose, Bld: 131 mg/dL — ABNORMAL HIGH (ref 70–99)
POTASSIUM: 4.2 mmol/L (ref 3.5–5.1)
SODIUM: 135 mmol/L (ref 135–145)

## 2018-03-03 LAB — HEMOGLOBIN AND HEMATOCRIT, BLOOD
HCT: 40.5 % (ref 39.0–52.0)
HEMOGLOBIN: 13.5 g/dL (ref 13.0–17.0)

## 2018-03-03 MED ORDER — ACETAMINOPHEN 10 MG/ML IV SOLN
1000.0000 mg | Freq: Four times a day (QID) | INTRAVENOUS | Status: AC
  Administered 2018-03-04 (×3): 1000 mg via INTRAVENOUS
  Filled 2018-03-03 (×4): qty 100

## 2018-03-03 MED ORDER — ACETAMINOPHEN 10 MG/ML IV SOLN: 1000.0000 mg | Freq: Four times a day (QID) | INTRAVENOUS | Status: DC

## 2018-03-03 MED ORDER — TRAMADOL HCL 50 MG PO TABS: 50.0000 mg | ORAL_TABLET | ORAL | Status: DC | PRN

## 2018-03-03 MED ORDER — TRAMADOL HCL 50 MG PO TABS
50.0000 mg | ORAL_TABLET | ORAL | Status: DC | PRN
  Administered 2018-03-03 – 2018-03-05 (×5): 50 mg via ORAL
  Filled 2018-03-03 (×5): qty 1

## 2018-03-03 NOTE — Progress Notes (Signed)
Patient has ambulated x3 this shift- most recent time was around 180 feet in hallway. Pt tolerates well with walker and standby assist. Requesting pain medication less strong than ordered Morphine- will notify MD.

## 2018-03-03 NOTE — Progress Notes (Signed)
1 Day Post-Op Subjective: Patient denies nausea. Pain well controlled. Has walked already.  Objective: Vital signs in last 24 hours: Temp:  [96.6 F (35.9 C)-98.7 F (37.1 C)] 98.7 F (37.1 C) (09/21 0405) Pulse Rate:  [63-77] 67 (09/21 0405) Resp:  [8-18] 18 (09/21 0405) BP: (112-160)/(70-93) 112/70 (09/21 0405) SpO2:  [94 %-100 %] 94 % (09/21 0405) Arterial Line BP: (149)/(69) 149/69 (09/20 1430)  Intake/Output from previous day: 09/20 0701 - 09/21 0700 In: 3881.5 [P.O.:240; I.V.:3337; IV Piggyback:304.4] Out: 2250 [Urine:1905; Drains:195; Blood:150] Intake/Output this shift: No intake/output data recorded.  Physical Exam:  Constitutional: Vital signs reviewed. WD WN in NAD   Eyes: PERRL, No scleral icterus.   Cardiovascular: RRR Pulmonary/Chest: Normal effort Abdominal: Distended, non tender. Tympanitic. organomegaly, or guarding present.  Extremities: No cyanosis or edema   Blake drain output bloody.  Lab Results: Recent Labs    03/02/18 1448 03/03/18 0452  HGB 15.5 13.5  HCT 46.2 40.5   BMET Recent Labs    03/03/18 0452  NA 135  K 4.2  CL 100  CO2 23  GLUCOSE 131*  BUN 23  CREATININE 1.43*  CALCIUM 8.2*   No results for input(s): LABPT, INR in the last 72 hours. No results for input(s): LABURIN in the last 72 hours. Results for orders placed or performed during the hospital encounter of 02/20/12  Wound culture     Status: None   Collection Time: 02/20/12 11:16 AM  Result Value Ref Range Status   Specimen Description WOUND  Final   Special Requests TAKEN FROM AROUND THORACIC HARDWARE PT ON VANCO  Final   Gram Stain   Final    RARE WBC PRESENT,BOTH PMN AND MONONUCLEAR NO ORGANISMS SEEN Gram Stain Report Called to,Read Back By and Verified With: Gram Stain Report Called to,Read Back By and Verified With: [place] PACU AT 4403 02/20/12 BY K SCHULTZ Performed at [facility]   Culture NO GROWTH 3 DAYS  Final   Report Status 02/24/2012 FINAL   Final  Anaerobic culture     Status: None   Collection Time: 02/20/12 11:16 AM  Result Value Ref Range Status   Specimen Description WOUND  Final   Special Requests TAKEN FROM AROUND THORACIC HARDWARE PT ON VANCO  Final   Gram Stain   Final    RARE WBC PRESENT,BOTH PMN AND MONONUCLEAR NO ORGANISMS SEEN Gram Stain Report Called to,Read Back By and Verified With: Gram Stain Report Called to,Read Back By and Verified With: [place] PACU AT 4742 02/20/12 BY K SCHULTZ Performed at [facility]   Culture NO ANAEROBES ISOLATED  Final   Report Status 02/25/2012 FINAL  Final  Gram stain     Status: None   Collection Time: 02/20/12 11:16 AM  Result Value Ref Range Status   Specimen Description WOUND  Final   Special Requests TAKEN FROM AROUND THORACIC HARDWARE PT ON VANCO  Final   Gram Stain   Final    RARE WBC PRESENT,BOTH PMN AND MONONUCLEAR NO ORGANISMS SEEN CALLED TO DAVID IN PACU 02/20/12 1240 BY K SCHULTZ   Report Status 02/20/2012 FINAL  Final  Wound culture     Status: None   Collection Time: 02/20/12 11:28 AM  Result Value Ref Range Status   Specimen Description WOUND  Final   Special Requests HARDWARE REMOVED FROM THORACIC WOUND PT ON VANCO  Final   Gram Stain   Final    RARE WBC PRESENT,BOTH PMN AND MONONUCLEAR NO  ORGANISMS SEEN Gram Stain Report Called to,Read Back By and Verified With: Gram Stain Report Called to,Read Back By and Verified With: [place] PACU AT 1325 02/20/12 BY K SCHULTZ Performed at [facility]   Culture   Final    MULTIPLE ORGANISMS PRESENT, NONE PREDOMINANT Note: NO STAPHYLOCOCCUS AUREUS ISOLATED NO GROUP A STREP (S.PYOGENES) ISOLATED   Report Status 02/23/2012 FINAL  Final  Anaerobic culture     Status: None   Collection Time: 02/20/12 11:28 AM  Result Value Ref Range Status   Specimen Description WOUND  Final   Special Requests HARDWARE REMOVED FROM THORACIC WOUND PT ON VANCO  Final   Gram Stain   Final    RARE WBC PRESENT,BOTH PMN AND  MONONUCLEAR NO ORGANISMS SEEN Gram Stain Report Called to,Read Back By and Verified With: Gram Stain Report Called to,Read Back By and Verified With: [place] PACU 1325 02/20/12 BY K SCHULTZ Performed at [facility]   Culture NO ANAEROBES ISOLATED  Final   Report Status 02/25/2012 FINAL  Final  Gram stain     Status: None   Collection Time: 02/20/12 11:28 AM  Result Value Ref Range Status   Specimen Description WOUND  Final   Special Requests HARDWARE REMOVED FROM THORACIC WOUND PT ON VANCO  Final   Gram Stain   Final    RARE WBC PRESENT,BOTH PMN AND MONONUCLEAR NO ORGANISMS SEEN CALLED TO S JONES IN PACU 02/20/12 1325 BY K SCHULTZ   Report Status 02/20/2012 FINAL  Final  Tissue culture     Status: None   Collection Time: 02/20/12 11:35 AM  Result Value Ref Range Status   Specimen Description TISSUE  Final   Special Requests THORACIC  PT ON VANCO  Final   Gram Stain   Final    NO WBC SEEN NO ORGANISMS SEEN Gram Stain Report Called to,Read Back By and Verified With: Gram Stain Report Called to,Read Back By and Verified With: [place] AT 1325 02/20/12 BY K SCHULTZ Performed at [facility]   Culture NO GROWTH 3 DAYS  Final   Report Status 02/24/2012 FINAL  Final  Anaerobic culture     Status: None   Collection Time: 02/20/12 11:35 AM  Result Value Ref Range Status   Specimen Description TISSUE  Final   Special Requests THORACIC PT ON VANCO  Final   Gram Stain   Final    NO WBC SEEN NO ORGANISMS SEEN Gram Stain Report Called to,Read Back By and Verified With: Gram Stain Report Called to,Read Back By and Verified With: Adah Salvage AT 2952 02/20/12 BY K SCHULTZ Performed at [facility]   Culture NO ANAEROBES ISOLATED  Final   Report Status 02/25/2012 FINAL  Final  Gram stain     Status: None   Collection Time: 02/20/12 11:35 AM  Result Value Ref Range Status   Specimen Description TISSUE  Final   Special Requests THORACIC PT ON VANCO  Final   Gram Stain   Final    NO  WBC SEEN NO ORGANISMS SEEN CALLED TO S JONES IN PACU 02/20/12 1325 BY K SCHULTZ   Report Status 02/20/2012 FINAL  Final  Wound culture     Status: None   Collection Time: 02/20/12 11:51 AM  Result Value Ref Range Status   Specimen Description WOUND  Final   Special Requests SECOND NO1 THORACIC WOUND PT ON VANCO  Final   Gram Stain   Final  FEW WBC PRESENT,BOTH PMN AND MONONUCLEAR NO ORGANISMS SEEN Gram Stain Report Called to,Read Back By and Verified With: Gram Stain Report Called to,Read Back By and Verified With: [place] PACU AT 6160 02/20/12 BY K SCHULTZ Performed at [facility]   Culture NO GROWTH 2 DAYS  Final   Report Status 02/23/2012 FINAL  Final  Anaerobic culture     Status: None   Collection Time: 02/20/12 11:51 AM  Result Value Ref Range Status   Specimen Description WOUND  Final   Special Requests SECOND NO1 THORACIC WOUND PT ON VANCO  Final   Gram Stain   Final    FEW WBC PRESENT,BOTH PMN AND MONONUCLEAR NO ORGANISMS SEEN Gram Stain Report Called to,Read Back By and Verified With: Gram Stain Report Called to,Read Back By and Verified With: [place] PACU AT 7371 02/20/12 BY K SCHULTZ Performed at [facility]   Culture NO ANAEROBES ISOLATED  Final   Report Status 02/25/2012 FINAL  Final  Gram stain     Status: None   Collection Time: 02/20/12 11:51 AM  Result Value Ref Range Status   Specimen Description WOUND  Final   Special Requests SECOND NO1 THORACIC WOUND PT ON VANCO  Final   Gram Stain   Final    FEW WBC PRESENT,BOTH PMN AND MONONUCLEAR NO ORGANISMS SEEN CALLED TO DAVID IN PACU 02/20/12 1240 BY K SCHULTZ   Report Status 02/20/2012 FINAL  Final    Studies/Results: No results found.  Assessment/Plan:   POD 1 partial nephrectomy. He is doing well. Belly appropriately distended. Not yet ready for reg diet  D/C foley, ambulate   LOS: 1 day   Jorja Loa 03/03/2018, 8:06 AM

## 2018-03-03 NOTE — Progress Notes (Signed)
Patient ID: [name], male   DOB: October 01, 1945, 72 y.o.   MRN: [id number] [facility] Surgery Progress Note:   1 Day Post-Op  Subjective: Mental status is alert;  appropriate Objective: Vital signs in last 24 hours: Temp:  [96.6 F (35.9 C)-98.7 F (37.1 C)] 98.7 F (37.1 C) (09/21 0405) Pulse Rate:  [63-77] 67 (09/21 0405) Resp:  [8-18] 18 (09/21 0405) BP: (112-160)/(70-93) 112/70 (09/21 0405) SpO2:  [94 %-100 %] 94 % (09/21 0405) Arterial Line BP: (149)/(69) 149/69 (09/20 1430)  Intake/Output from previous day: 09/20 0701 - 09/21 0700 In: 3881.5 [P.O.:240; I.V.:3337; IV Piggyback:304.4] Out: 2250 [Urine:1905; Drains:195; Blood:150] Intake/Output this shift: No intake/output data recorded.  Physical Exam: Work of breathing is OK;  Abdomen with binder;  Incisions look OK;  distended  Lab Results:  Results for orders placed or performed during the hospital encounter of 03/02/18 (from the past 48 hour(s))  Glucose, capillary     Status: Abnormal   Collection Time: 03/02/18  6:35 AM  Result Value Ref Range   Glucose-Capillary 69 (L) 70 - 99 mg/dL  Glucose, capillary     Status: Abnormal   Collection Time: 03/02/18 10:33 AM  Result Value Ref Range   Glucose-Capillary 137 (H) 70 - 99 mg/dL  Glucose, capillary     Status: Abnormal   Collection Time: 03/02/18  2:47 PM  Result Value Ref Range   Glucose-Capillary 167 (H) 70 - 99 mg/dL  Hemoglobin and hematocrit, blood     Status: None   Collection Time: 03/02/18  2:48 PM  Result Value Ref Range   Hemoglobin 15.5 13.0 - 17.0 g/dL   HCT 46.2 39.0 - 52.0 %    Comment: Performed at [facility], [place] [address], [place], [place] [address]  Glucose, capillary     Status: Abnormal   Collection Time: 03/02/18  5:41 PM  Result Value Ref Range   Glucose-Capillary 119 (H) 70 - 99 mg/dL  Basic metabolic panel     Status: Abnormal   Collection Time: 03/03/18  4:52 AM  Result Value Ref Range   Sodium 135 135  - 145 mmol/L   Potassium 4.2 3.5 - 5.1 mmol/L   Chloride 100 98 - 111 mmol/L   CO2 23 22 - 32 mmol/L   Glucose, Bld 131 (H) 70 - 99 mg/dL   BUN 23 8 - 23 mg/dL   Creatinine, Ser 1.43 (H) 0.61 - 1.24 mg/dL   Calcium 8.2 (L) 8.9 - 10.3 mg/dL   GFR calc non Af Amer 47 (L) >60 mL/min   GFR calc Af Amer 54 (L) >60 mL/min    Comment: (NOTE) The eGFR has been calculated using the CKD EPI equation. This calculation has not been validated in all clinical situations. eGFR's persistently <60 mL/min signify possible Chronic Kidney Disease.    Anion gap 12 5 - 15    Comment: Performed at [facility], [place] [address], [place], [place] [address]  Hemoglobin and hematocrit, blood     Status: None   Collection Time: 03/03/18  4:52 AM  Result Value Ref Range   Hemoglobin 13.5 13.0 - 17.0 g/dL   HCT 40.5 39.0 - 52.0 %    Comment: Performed at [facility], [place] [address] [place], [place] [address]  Glucose, capillary     Status: Abnormal   Collection Time: 03/03/18  7:58 AM  Result Value Ref Range   Glucose-Capillary 104 (H) 70 - 99 mg/dL  Radiology/Results: No results found.  Anti-infectives: Anti-infectives (From admission, onward)   Start     Dose/Rate Route Frequency Ordered Stop   03/02/18 2000  ceFAZolin (ANCEF) IVPB 2g/100 mL premix     2 g 200 mL/hr over 30 Minutes Intravenous Every 8 hours 03/02/18 1636 03/03/18 0659   03/02/18 1123  ceFAZolin (ANCEF) 2-4 GM/100ML-% IVPB    Note to Pharmacy:  Bridget Hartshorn   : cabinet override      03/02/18 1123 03/02/18 2329   03/02/18 0554  ceFAZolin (ANCEF) 2-4 GM/100ML-% IVPB    Note to Pharmacy:  Waldron Session   : cabinet override      03/02/18 0554 03/02/18 1759      Assessment/Plan: Problem List: Patient Active Problem List   Diagnosis Date Noted  . 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 Other abnormal glucose 02/15/2018  . Anxiety 08/08/2017  . Aortic atherosclerosis ([place]) 04/28/2017  . OSA and COPD overlap syndrome ([place]) 09/14/2015  . Obesity (BMI 30.0-34.9) 03/11/2015  . Encounter for Medicare annual wellness exam 03/11/2015  . Rhinitis, nonallergic, chronic 07/01/2014  . Vitamin D deficiency 07/25/2013  . Medication management 07/25/2013  . Hyperlipidemia 01/13/2009  . Cluster headache syndrome 01/13/2009  . Essential hypertension 01/13/2009  . Coronary atherosclerosis 01/13/2009  . COPD mixed type ([place]) 01/13/2009  . GERD 01/13/2009  . Lumbago, Chronic 01/13/2009  . PUD 01/13/2009  . BPH/Prostatism 01/13/2009    Stable postop -- advancing slowly.   1 Day Post-Op    LOS: 1 day   Matt B. Hassell Done, MD, [facility] [phone] beeper [phone]  03/03/2018 9:23 AM

## 2018-03-04 DIAGNOSIS — M6208 Separation of muscle (nontraumatic), other site: Secondary | ICD-10-CM | POA: Diagnosis not present

## 2018-03-04 DIAGNOSIS — F419 Anxiety disorder, unspecified: Secondary | ICD-10-CM | POA: Diagnosis not present

## 2018-03-04 DIAGNOSIS — F329 Major depressive disorder, single episode, unspecified: Secondary | ICD-10-CM | POA: Diagnosis not present

## 2018-03-04 DIAGNOSIS — Z23 Encounter for immunization: Secondary | ICD-10-CM | POA: Diagnosis not present

## 2018-03-04 DIAGNOSIS — K660 Peritoneal adhesions (postprocedural) (postinfection): Secondary | ICD-10-CM | POA: Diagnosis not present

## 2018-03-04 DIAGNOSIS — K430 Incisional hernia with obstruction, without gangrene: Secondary | ICD-10-CM | POA: Diagnosis not present

## 2018-03-04 LAB — GLUCOSE, CAPILLARY
GLUCOSE-CAPILLARY: 94 mg/dL (ref 70–99)
GLUCOSE-CAPILLARY: 119 mg/dL — AB (ref 70–99)
GLUCOSE-CAPILLARY: 104 mg/dL — AB (ref 70–99)
Glucose-Capillary: 95 mg/dL (ref 70–99)

## 2018-03-04 LAB — CREATININE, FLUID (PLEURAL, PERITONEAL, JP DRAINAGE): Creat, Fluid: 1.2 mg/dL

## 2018-03-04 MED ORDER — SODIUM CHLORIDE 0.9 % IV SOLN: INTRAVENOUS | Status: DC | PRN

## 2018-03-04 MED ORDER — FLEET ENEMA 7-19 GM/118ML RE ENEM
1.0000 | ENEMA | Freq: Once | RECTAL | Status: AC
  Administered 2018-03-04: 1 via RECTAL
  Filled 2018-03-04: qty 1

## 2018-03-04 MED ORDER — MAGNESIUM CITRATE PO SOLN: 0.5000 | Freq: Once | ORAL | Status: DC

## 2018-03-04 MED ORDER — MAGNESIUM CITRATE PO SOLN: 0.5000 | Freq: Once | ORAL | Status: DC | PRN

## 2018-03-04 NOTE — Progress Notes (Signed)
Pt feels as though he needs to have a BM but is unable to. PRN Dulcolax suppository given with no success. Pt feels as though he is "all bound up." States he has many issues with constipation at home requiring Fleets enemas often. Requesting one at this time. MD Dahlstedt notified- see new orders. Will administer Fleet enema and repeat in 3 hours if no success. Also per MD, pt may have 1/2 bottle of Magnesium Citrate if no BM by 2200.

## 2018-03-04 NOTE — Progress Notes (Signed)
2 Days Post-Op Subjective: Patient reports passing some flatus  Objective: Vital signs in last 24 hours: Temp:  [98 F (36.7 C)-98.8 F (37.1 C)] 98.6 F (37 C) (09/22 0516) Pulse Rate:  [65-77] 77 (09/22 0516) Resp:  [16-20] 16 (09/22 0516) BP: (118-143)/(67-81) 132/74 (09/22 0516) SpO2:  [95 %-97 %] 95 % (09/22 0516)  Intake/Output from previous day: 09/21 0701 - 09/22 0700 In: 2497.8 [P.O.:600; I.V.:1701.9; IV Piggyback:195.9] Out: 2345 [Urine:2250; Drains:95] Intake/Output this shift: No intake/output data recorded.  Physical Exam:  Constitutional: Vital signs reviewed. WD WN in NAD   Eyes: PERRL, No scleral icterus.   Cardiovascular: RRR Pulmonary/Chest: Normal effort Abdominal: Distended, tympanitic. Appropriately tender.  Extremities: No cyanosis or edema   Lab Results: Recent Labs    03/02/18 1448 03/03/18 0452  HGB 15.5 13.5  HCT 46.2 40.5   BMET Recent Labs    03/03/18 0452  NA 135  K 4.2  CL 100  CO2 23  GLUCOSE 131*  BUN 23  CREATININE 1.43*  CALCIUM 8.2*   No results for input(s): LABPT, INR in the last 72 hours. No results for input(s): LABURIN in the last 72 hours. Results for orders placed or performed during the hospital encounter of 02/20/12  Wound culture     Status: None   Collection Time: 02/20/12 11:16 AM  Result Value Ref Range Status   Specimen Description WOUND  Final   Special Requests TAKEN FROM AROUND THORACIC HARDWARE PT ON VANCO  Final   Gram Stain   Final    RARE WBC PRESENT,BOTH PMN AND MONONUCLEAR NO ORGANISMS SEEN Gram Stain Report Called to,Read Back By and Verified With: Gram Stain Report Called to,Read Back By and Verified With: [place] PACU AT 7517 02/20/12 BY K SCHULTZ Performed at [facility]   Culture NO GROWTH 3 DAYS  Final   Report Status 02/24/2012 FINAL  Final  Anaerobic culture     Status: None   Collection Time: 02/20/12 11:16 AM  Result Value Ref Range Status   Specimen Description WOUND  Final    Special Requests TAKEN FROM AROUND THORACIC HARDWARE PT ON VANCO  Final   Gram Stain   Final    RARE WBC PRESENT,BOTH PMN AND MONONUCLEAR NO ORGANISMS SEEN Gram Stain Report Called to,Read Back By and Verified With: Gram Stain Report Called to,Read Back By and Verified With: [place] PACU AT 0017 02/20/12 BY K SCHULTZ Performed at [facility]   Culture NO ANAEROBES ISOLATED  Final   Report Status 02/25/2012 FINAL  Final  Gram stain     Status: None   Collection Time: 02/20/12 11:16 AM  Result Value Ref Range Status   Specimen Description WOUND  Final   Special Requests TAKEN FROM AROUND THORACIC HARDWARE PT ON VANCO  Final   Gram Stain   Final    RARE WBC PRESENT,BOTH PMN AND MONONUCLEAR NO ORGANISMS SEEN CALLED TO DAVID IN PACU 02/20/12 1240 BY K SCHULTZ   Report Status 02/20/2012 FINAL  Final  Wound culture     Status: None   Collection Time: 02/20/12 11:28 AM  Result Value Ref Range Status   Specimen Description WOUND  Final   Special Requests HARDWARE REMOVED FROM THORACIC WOUND PT ON VANCO  Final   Gram Stain   Final    RARE WBC PRESENT,BOTH PMN AND MONONUCLEAR NO ORGANISMS SEEN Gram Stain Report Called to,Read Back By and Verified With: Gram Stain Report Called to,Read Back By and Verified  With: [place] PACU AT 1325 02/20/12 BY K SCHULTZ Performed at [facility]   Culture   Final    MULTIPLE ORGANISMS PRESENT, NONE PREDOMINANT Note: NO STAPHYLOCOCCUS AUREUS ISOLATED NO GROUP A STREP (S.PYOGENES) ISOLATED   Report Status 02/23/2012 FINAL  Final  Anaerobic culture     Status: None   Collection Time: 02/20/12 11:28 AM  Result Value Ref Range Status   Specimen Description WOUND  Final   Special Requests HARDWARE REMOVED FROM THORACIC WOUND PT ON VANCO  Final   Gram Stain   Final    RARE WBC PRESENT,BOTH PMN AND MONONUCLEAR NO ORGANISMS SEEN Gram Stain Report Called to,Read Back By and Verified With: Gram Stain Report Called to,Read Back By and Verified With: [place] PACU 1325 02/20/12 BY K SCHULTZ Performed at [facility]   Culture NO ANAEROBES ISOLATED  Final   Report Status 02/25/2012 FINAL  Final  Gram stain     Status: None   Collection Time: 02/20/12 11:28 AM  Result Value Ref Range Status   Specimen Description WOUND  Final   Special Requests HARDWARE REMOVED FROM THORACIC WOUND PT ON VANCO  Final   Gram Stain   Final    RARE WBC PRESENT,BOTH PMN AND MONONUCLEAR NO ORGANISMS SEEN CALLED TO S JONES IN PACU 02/20/12 1325 BY K SCHULTZ   Report Status 02/20/2012 FINAL  Final  Tissue culture     Status: None   Collection Time: 02/20/12 11:35 AM  Result Value Ref Range Status   Specimen Description TISSUE  Final   Special Requests THORACIC  PT ON VANCO  Final   Gram Stain   Final    NO WBC SEEN NO ORGANISMS SEEN Gram Stain Report Called to,Read Back By and Verified With: Gram Stain Report Called to,Read Back By and Verified With: Adah Salvage AT 1325 02/20/12 BY K SCHULTZ Performed at [facility]   Culture NO GROWTH 3 DAYS  Final   Report Status 02/24/2012 FINAL  Final  Anaerobic culture     Status: None   Collection Time: 02/20/12 11:35 AM  Result Value Ref Range Status   Specimen Description TISSUE  Final   Special Requests THORACIC PT ON VANCO  Final   Gram Stain   Final    NO WBC SEEN NO ORGANISMS SEEN Gram Stain Report Called to,Read Back By and Verified With: Gram Stain Report Called to,Read Back By and Verified With: Adah Salvage AT 4782 02/20/12 BY K SCHULTZ Performed at [facility]   Culture NO ANAEROBES ISOLATED  Final   Report Status 02/25/2012 FINAL  Final  Gram stain     Status: None   Collection Time: 02/20/12 11:35 AM  Result Value Ref Range Status   Specimen Description TISSUE  Final   Special Requests THORACIC PT ON VANCO  Final   Gram Stain   Final    NO WBC SEEN NO ORGANISMS SEEN CALLED TO S JONES IN PACU 02/20/12 1325 BY K SCHULTZ   Report Status 02/20/2012 FINAL  Final  Wound culture     Status: None    Collection Time: 02/20/12 11:51 AM  Result Value Ref Range Status   Specimen Description WOUND  Final   Special Requests SECOND NO1 THORACIC WOUND PT ON VANCO  Final   Gram Stain   Final    FEW WBC PRESENT,BOTH PMN AND MONONUCLEAR NO ORGANISMS SEEN Gram Stain Report Called to,Read Back By and Verified With: Gram  Stain Report Called to,Read Back By and Verified With: [place] PACU AT 0350 02/20/12 BY K SCHULTZ Performed at [facility]   Culture NO GROWTH 2 DAYS  Final   Report Status 02/23/2012 FINAL  Final  Anaerobic culture     Status: None   Collection Time: 02/20/12 11:51 AM  Result Value Ref Range Status   Specimen Description WOUND  Final   Special Requests SECOND NO1 THORACIC WOUND PT ON VANCO  Final   Gram Stain   Final    FEW WBC PRESENT,BOTH PMN AND MONONUCLEAR NO ORGANISMS SEEN Gram Stain Report Called to,Read Back By and Verified With: Gram Stain Report Called to,Read Back By and Verified With: [place] PACU AT 0938 02/20/12 BY K SCHULTZ Performed at [facility]   Culture NO ANAEROBES ISOLATED  Final   Report Status 02/25/2012 FINAL  Final  Gram stain     Status: None   Collection Time: 02/20/12 11:51 AM  Result Value Ref Range Status   Specimen Description WOUND  Final   Special Requests SECOND NO1 THORACIC WOUND PT ON VANCO  Final   Gram Stain   Final    FEW WBC PRESENT,BOTH PMN AND MONONUCLEAR NO ORGANISMS SEEN CALLED TO DAVID IN PACU 02/20/12 1240 BY K SCHULTZ   Report Status 02/20/2012 FINAL  Final    Studies/Results: No results found.  Assessment/Plan:   POD 2 rt part nephr. And ventral hernia repair. Some flatus but distended. Pt reticenty to go home w/ several c/o's. Will ambulate more, check drain fluid and possibly remove drain later. Keep pt 1 more night.   LOS: 1 day   Jorja Loa 03/04/2018, 8:17 AM

## 2018-03-04 NOTE — Progress Notes (Addendum)
Patient ID: [name], male   DOB: [birth year], 72 y.o.   MRN: [id number] Doing well. OK for discharge from ventral hernia repair.  Will sign off. Kaylyn Lim, MD, FACS for [facility]

## 2018-03-04 NOTE — Progress Notes (Signed)
Pt states he did have a small, hard BM. Stated he had to "put on a glove and help it come out." This was after enema- pt administered to himself. Pt states he still feels as though he needs to have a BM. Will administer another enema 3 hours after the first as ordered, and Magnesium citrate later PRN.

## 2018-03-05 ENCOUNTER — Encounter (HOSPITAL_COMMUNITY): Payer: Self-pay | Admitting: Surgery

## 2018-03-05 DIAGNOSIS — K660 Peritoneal adhesions (postprocedural) (postinfection): Secondary | ICD-10-CM | POA: Diagnosis not present

## 2018-03-05 DIAGNOSIS — M6208 Separation of muscle (nontraumatic), other site: Secondary | ICD-10-CM | POA: Diagnosis not present

## 2018-03-05 DIAGNOSIS — Z23 Encounter for immunization: Secondary | ICD-10-CM | POA: Diagnosis not present

## 2018-03-05 DIAGNOSIS — F329 Major depressive disorder, single episode, unspecified: Secondary | ICD-10-CM | POA: Diagnosis not present

## 2018-03-05 DIAGNOSIS — F419 Anxiety disorder, unspecified: Secondary | ICD-10-CM | POA: Diagnosis not present

## 2018-03-05 DIAGNOSIS — K430 Incisional hernia with obstruction, without gangrene: Secondary | ICD-10-CM | POA: Diagnosis not present

## 2018-03-05 LAB — GLUCOSE, CAPILLARY: GLUCOSE-CAPILLARY: 95 mg/dL (ref 70–99)

## 2018-03-05 MED ORDER — TRAMADOL HCL 50 MG PO TABS: 50.0000 mg | ORAL_TABLET | Freq: Four times a day (QID) | ORAL | 0 refills | Status: DC | PRN

## 2018-03-05 NOTE — Discharge Summary (Signed)
Date of admission: 03/02/2018  Date of discharge: 03/05/2018  Admission diagnosis: Left renal mass, ventral hernia  Discharge diagnosis: same  Secondary diagnoses:  Patient Active Problem List   Diagnosis Date Noted  . 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 Other abnormal glucose 02/15/2018  . Anxiety 08/08/2017  . Aortic atherosclerosis ([place]) 04/28/2017  . OSA and COPD overlap syndrome ([place]) 09/14/2015  . Obesity (BMI 30.0-34.9) 03/11/2015  . Encounter for Medicare annual wellness exam 03/11/2015  . Rhinitis, nonallergic, chronic 07/01/2014  . Vitamin D deficiency 07/25/2013  . Medication management 07/25/2013  . Hyperlipidemia 01/13/2009  . Cluster headache syndrome 01/13/2009  . Essential hypertension 01/13/2009  . Coronary atherosclerosis 01/13/2009  . COPD mixed type ([place]) 01/13/2009  . GERD 01/13/2009  . Lumbago, Chronic 01/13/2009  . PUD 01/13/2009  . BPH/Prostatism 01/13/2009    Procedures performed: Procedure(s): LAPAROSCOPIC REPAIR OF INCARCERATED INCISIONAL HERNIA WITH LYSIS OF ADHESIONS XI ROBOTIC ASSITED LEFT PARTIAL NEPHRECTOMY WITH LYSIS OF ADHESIONS X30 MINUTES.  History and Physical: For full details, please see admission history and physical. Briefly, [name] is a 72 y.o. year old patient with small left renal mass and ventral hernia.   Hospital Course: Patient tolerated the procedure well.  He was then transferred to the floor after an uneventful PACU stay.  His hospital course was uncomplicated.  Was kept an extra day because of abdominal pain and constipation.    On POD#3 he had met discharge criteria: was eating a regular diet, was up and ambulating independently,  pain was well controlled, was voiding without a catheter, and was ready to for discharge.     Laboratory values:  Recent Labs    03/02/18 1448  03/03/18 0452  HGB 15.5 13.5  HCT 46.2 40.5   Recent Labs    03/03/18 0452  NA 135  K 4.2  CL 100  CO2 23  GLUCOSE 131*  BUN 23  CREATININE 1.43*  CALCIUM 8.2*   No results for input(s): LABPT, INR in the last 72 hours. No results for input(s): LABURIN in the last 72 hours. Results for orders placed or performed during the hospital encounter of 02/20/12  Wound culture     Status: None   Collection Time: 02/20/12 11:16 AM  Result Value Ref Range Status   Specimen Description WOUND  Final   Special Requests TAKEN FROM AROUND THORACIC HARDWARE PT ON VANCO  Final   Gram Stain   Final    RARE WBC PRESENT,BOTH PMN AND MONONUCLEAR NO ORGANISMS SEEN Gram Stain Report Called to,Read Back By and Verified With: Gram Stain Report Called to,Read Back By and Verified With: [place] PACU AT 9381 02/20/12 BY K SCHULTZ Performed at [facility]   Culture NO GROWTH 3 DAYS  Final   Report Status 02/24/2012 FINAL  Final  Anaerobic culture     Status: None   Collection Time: 02/20/12 11:16 AM  Result Value Ref Range Status   Specimen Description WOUND  Final   Special Requests TAKEN FROM AROUND THORACIC HARDWARE PT ON VANCO  Final   Gram Stain   Final    RARE WBC PRESENT,BOTH PMN AND MONONUCLEAR NO ORGANISMS SEEN Gram Stain Report Called to,Read Back By and Verified With: Gram Stain Report Called to,Read Back By and Verified With: [place] PACU AT 0175 02/20/12 BY K SCHULTZ Performed at [facility]  Culture NO ANAEROBES ISOLATED  Final   Report Status 02/25/2012 FINAL  Final  Gram stain     Status: None   Collection Time: 02/20/12 11:16 AM  Result Value Ref Range Status   Specimen Description WOUND  Final   Special Requests TAKEN FROM AROUND THORACIC HARDWARE PT ON VANCO  Final   Gram Stain   Final    RARE WBC PRESENT,BOTH PMN AND MONONUCLEAR NO ORGANISMS SEEN CALLED TO DAVID IN PACU 02/20/12 1240 BY K SCHULTZ   Report Status 02/20/2012 FINAL  Final  Wound culture     Status:  None   Collection Time: 02/20/12 11:28 AM  Result Value Ref Range Status   Specimen Description WOUND  Final   Special Requests HARDWARE REMOVED FROM THORACIC WOUND PT ON VANCO  Final   Gram Stain   Final    RARE WBC PRESENT,BOTH PMN AND MONONUCLEAR NO ORGANISMS SEEN Gram Stain Report Called to,Read Back By and Verified With: Gram Stain Report Called to,Read Back By and Verified With: [place] PACU AT 1325 02/20/12 BY K SCHULTZ Performed at [facility]   Culture   Final    MULTIPLE ORGANISMS PRESENT, NONE PREDOMINANT Note: NO STAPHYLOCOCCUS AUREUS ISOLATED NO GROUP A STREP (S.PYOGENES) ISOLATED   Report Status 02/23/2012 FINAL  Final  Anaerobic culture     Status: None   Collection Time: 02/20/12 11:28 AM  Result Value Ref Range Status   Specimen Description WOUND  Final   Special Requests HARDWARE REMOVED FROM THORACIC WOUND PT ON VANCO  Final   Gram Stain   Final    RARE WBC PRESENT,BOTH PMN AND MONONUCLEAR NO ORGANISMS SEEN Gram Stain Report Called to,Read Back By and Verified With: Gram Stain Report Called to,Read Back By and Verified With: S Ronnald Ramp IN PACU 1325 02/20/12 BY K SCHULTZ Performed at [facility]   Culture NO ANAEROBES ISOLATED  Final   Report Status 02/25/2012 FINAL  Final  Gram stain     Status: None   Collection Time: 02/20/12 11:28 AM  Result Value Ref Range Status   Specimen Description WOUND  Final   Special Requests HARDWARE REMOVED FROM THORACIC WOUND PT ON VANCO  Final   Gram Stain   Final    RARE WBC PRESENT,BOTH PMN AND MONONUCLEAR NO ORGANISMS SEEN CALLED TO S JONES IN PACU 02/20/12 1325 BY K SCHULTZ   Report Status 02/20/2012 FINAL  Final  Tissue culture     Status: None   Collection Time: 02/20/12 11:35 AM  Result Value Ref Range Status   Specimen Description TISSUE  Final   Special Requests THORACIC  PT ON VANCO  Final   Gram Stain   Final    NO WBC SEEN NO ORGANISMS SEEN Gram Stain Report Called to,Read Back By and Verified With:  Gram Stain Report Called to,Read Back By and Verified With: Adah Salvage AT 1325 02/20/12 BY K SCHULTZ Performed at [facility]   Culture NO GROWTH 3 DAYS  Final   Report Status 02/24/2012 FINAL  Final  Anaerobic culture     Status: None   Collection Time: 02/20/12 11:35 AM  Result Value Ref Range Status   Specimen Description TISSUE  Final   Special Requests THORACIC PT ON VANCO  Final   Gram Stain   Final    NO WBC SEEN NO ORGANISMS SEEN Gram Stain Report Called to,Read Back By and Verified With: Gram Stain Report Called to,Read Back By and Verified With:  Chauncey Cruel JONES AT 7416 02/20/12 BY K SCHULTZ Performed at [facility]   Culture NO ANAEROBES ISOLATED  Final   Report Status 02/25/2012 FINAL  Final  Gram stain     Status: None   Collection Time: 02/20/12 11:35 AM  Result Value Ref Range Status   Specimen Description TISSUE  Final   Special Requests THORACIC PT ON VANCO  Final   Gram Stain   Final    NO WBC SEEN NO ORGANISMS SEEN CALLED TO S JONES IN PACU 02/20/12 1325 BY K SCHULTZ   Report Status 02/20/2012 FINAL  Final  Wound culture     Status: None   Collection Time: 02/20/12 11:51 AM  Result Value Ref Range Status   Specimen Description WOUND  Final   Special Requests SECOND NO1 THORACIC WOUND PT ON VANCO  Final   Gram Stain   Final    FEW WBC PRESENT,BOTH PMN AND MONONUCLEAR NO ORGANISMS SEEN Gram Stain Report Called to,Read Back By and Verified With: Gram Stain Report Called to,Read Back By and Verified With: [place] PACU AT 3845 02/20/12 BY K SCHULTZ Performed at [facility]   Culture NO GROWTH 2 DAYS  Final   Report Status 02/23/2012 FINAL  Final  Anaerobic culture     Status: None   Collection Time: 02/20/12 11:51 AM  Result Value Ref Range Status   Specimen Description WOUND  Final   Special Requests SECOND NO1 THORACIC WOUND PT ON VANCO  Final   Gram Stain   Final    FEW WBC PRESENT,BOTH PMN AND MONONUCLEAR NO ORGANISMS SEEN Gram Stain Report Called  to,Read Back By and Verified With: Gram Stain Report Called to,Read Back By and Verified With: [place] PACU AT 3646 02/20/12 BY K SCHULTZ Performed at [facility]   Culture NO ANAEROBES ISOLATED  Final   Report Status 02/25/2012 FINAL  Final  Gram stain     Status: None   Collection Time: 02/20/12 11:51 AM  Result Value Ref Range Status   Specimen Description WOUND  Final   Special Requests SECOND NO1 THORACIC WOUND PT ON VANCO  Final   Gram Stain   Final    FEW WBC PRESENT,BOTH PMN AND MONONUCLEAR NO ORGANISMS SEEN CALLED TO DAVID IN PACU 02/20/12 1240 BY K SCHULTZ   Report Status 02/20/2012 FINAL  Final    Disposition: Home  Discharge instruction: The patient was instructed to be ambulatory but told to refrain from heavy lifting, strenuous activity, or driving.   Patient should resume his Plavix on Wednesday.  Discharge medications:  Allergies as of 03/05/2018      Reactions   Codeine Itching, Other (See Comments)   Also hallucinations    Morphine And Related Other (See Comments)   Hallucinations   Nsaids Other (See Comments)   BLEEDING--naprosyn   Oxycodone    Hallucinations   Crestor [rosuvastatin] Other (See Comments)   Soreness/aching   Cymbalta [duloxetine Hcl] Other (See Comments)   Pt is unsure of reaction type   Dilaudid [hydromorphone Hcl] Itching, Other (See Comments)   Hallucinations.   Effexor [venlafaxine] Other (See Comments)   Unsure of reactions type   Gluten Meal Other (See Comments)   Runny nose (constant); bloating.   Topamax [topiramate] Other (See Comments)   Caused visual impairments/swelling in eyes--pinch off optic nerve (temporary blindness)   Trazodone And Nefazodone Other (See Comments)   Unsure of reaction type   Adhesive [tape] Other (See Comments)  SKIN PEELS--PAPER TAPE IS OKAY   Penicillins Rash   Sulfa Antibiotics Swelling, Rash, Other (See Comments)   Mouth sores      Medication List    STOP taking these medications    ALPHA-LIPOIC ACID PO   aspirin EC 81 MG tablet   B COMPLEX PO   Choline Citrate 650 MG Tabs   clopidogrel 75 MG tablet Commonly known as:  PLAVIX   fish oil-omega-3 fatty acids 1000 MG capsule   Flax Seed Oil 4650 MG Caps   folic acid 354 MCG tablet Commonly known as:  FOLVITE   glucose blood test strip   Lecithin 1200 MG Caps   Melatonin 10 MG Tabs   meloxicam 15 MG tablet Commonly known as:  MOBIC   multivitamin with minerals Tabs tablet   niacin 500 MG tablet   Turmeric 500 MG Caps   VALERIAN PO   vitamin C 1000 MG tablet     TAKE these medications   acetaminophen 500 MG tablet Commonly known as:  TYLENOL Take 1 tablet (500 mg total) by mouth every 6 (six) hours as needed. What changed:    how much to take  reasons to take this   bisoprolol-hydrochlorothiazide 10-6.25 MG tablet Commonly known as:  ZIAC Take 1 tablet by mouth daily.   buPROPion 150 MG 24 hr tablet Commonly known as:  WELLBUTRIN XL Take 1 tablet (150 mg total) by mouth daily.   diazepam 5 MG tablet Commonly known as:  VALIUM Take 1/2-1 tablet 2 - 3 x /day ONLY if needed for Anxiety Attack &  limit to 5 days /week to avoid addiction   finasteride 5 MG tablet Commonly known as:  PROSCAR Take 5 mg by mouth daily.   FREESTYLE FREEDOM LITE w/Device Kit Check blood sugar 1 time a day. DX-E11.21   ipratropium 0.03 % nasal spray Commonly known as:  ATROVENT Place 1-2 sprays into both nostrils daily as needed (for allergies.).   Magnesium 500 MG Tabs Take 1,500 mg by mouth at bedtime.   nitroGLYCERIN 0.4 MG SL tablet Commonly known as:  NITROSTAT Place 0.4 mg under the tongue every 5 (five) minutes x 3 doses as needed for chest pain.   tamsulosin 0.4 MG Caps capsule Commonly known as:  FLOMAX Take 1 capsule daily for Prostate   tetrahydrozoline-zinc 0.05-0.25 % ophthalmic solution Commonly known as:  VISINE-AC Place 1-2 drops into both eyes 3 (three) times daily as needed  (for redness/irritation.).   traMADol 50 MG tablet Commonly known as:  ULTRAM Take 1-2 tablets (50-100 mg total) by mouth every 6 (six) hours as needed for moderate pain or severe pain.       Followup:  Follow-up Information    Ardis Hughs, MD Follow up.   Specialty:  Urology Why:  office will call you with date and time of appt.  Contact information: [place] [address] [phone]        Michael Boston, MD. Schedule an appointment as soon as possible for a visit in 3 weeks.   Specialty:  General Surgery Why:  To follow up after your operation Contact information: [address] [place] [address] [phone]

## 2018-03-05 NOTE — Discharge Instructions (Signed)
1.  Activity:  You are encouraged to ambulate frequently (about every hour during waking hours) to help prevent blood clots from forming in your legs or lungs.  However, you should not engage in any heavy lifting (> 10-15 lbs), strenuous activity, or straining. 2. Diet: You should advance your diet as instructed by your physician.  It will be normal to have some bloating, nausea, and abdominal discomfort intermittently. 3. Prescriptions:  You will be provided a prescription for pain medication to take as needed.  If your pain is not severe enough to require the prescription pain medication, you may take extra strength Tylenol instead which will have less side effects.  You should also take a prescribed stool softener to avoid straining with bowel movements as the prescription pain medication may constipate you. 4. Incisions: You may remove your dressing bandages 48 hours after surgery if not removed in the hospital.  You will either have some small staples or special tissue glue at each of the incision sites. Once the bandages are removed (if present), the incisions may stay open to air.  You may start showering (but not soaking or bathing in water) the 2nd day after surgery and the incisions simply need to be patted dry after the shower.  No additional care is needed. 5. What to call us about: You should call the office [phone]) if you develop fever > 101 or develop persistent vomiting.  You may resume aspirin, advil, aleve, vitamins, and supplements 7 days after surgery.  Patient should resume his Plavix on Wednesday.   HERNIA REPAIR: POST OP INSTRUCTIONS  ######################################################################  EAT Gradually transition to a high fiber diet with a fiber supplement over the next few weeks after discharge.  Start with a pureed / full liquid diet (see below)  WALK Walk an hour a day.  Control your pain to do that.    CONTROL PAIN Control pain so that you can  walk, sleep, tolerate sneezing/coughing, and go up/down stairs.  HAVE A BOWEL MOVEMENT DAILY Keep your bowels regular to avoid problems.  OK to try a laxative to override constipation.  OK to use an antidairrheal to slow down diarrhea.  Call if not better after 2 tries  CALL IF YOU HAVE PROBLEMS/CONCERNS Call if you are still struggling despite following these instructions. Call if you have concerns not answered by these instructions  ######################################################################    1. DIET: Follow a light bland diet the first 24 hours after arrival home, such as soup, liquids, crackers, etc.  Be sure to include lots of fluids daily.  Advance to a low fat / high fiber diet over the next few days after surgery.  Avoid fast food or heavy meals the first week as your are more likely to get nauseated.    2. Take your usually prescribed home medications unless otherwise directed.  3. PAIN CONTROL: a. Pain is best controlled by a usual combination of three different methods TOGETHER: i. Ice/Heat ii. Over the counter pain medication iii. Prescription pain medication b. Most patients will experience some swelling and bruising around the hernia(s) such as the bellybutton, groins, or old incisions.  Ice packs or heating pads (30-60 minutes up to 6 times a day) will help. Use ice for the first few days to help decrease swelling and bruising, then switch to heat to help relax tight/sore spots and speed recovery.  Some people prefer to use ice alone, heat alone, alternating between ice & heat.  Experiment to what works for  you.  Swelling and bruising can take several weeks to resolve.   c. It is helpful to take an over-the-counter pain medication regularly for the first few weeks.  Choose one of the following that works best for you: i. Naproxen (Aleve, etc)  Two 220mg  tabs twice a day ii. Ibuprofen (Advil, etc) Three 200mg  tabs four times a day (every meal &  bedtime) iii. Acetaminophen (Tylenol, etc) 325-650mg  four times a day (every meal & bedtime) d. A  prescription for pain medication should be given to you upon discharge.  Take your pain medication as prescribed.  i. If you are having problems/concerns with the prescription medicine (does not control pain, nausea, vomiting, rash, itching, etc), please call us [phone] to see if we need to switch you to a different pain medicine that will work better for you and/or control your side effect better. ii. If you need a refill on your pain medication, please contact your pharmacy.  They will contact our office to request authorization. Prescriptions will not be filled after 5 pm or on week-ends.  4. Avoid getting constipated.  Between the surgery and the pain medications, it is common to experience some constipation.  Increasing fluid intake and taking a fiber supplement (such as Metamucil, Citrucel, FiberCon, MiraLax, etc) 1-2 times a day regularly will usually help prevent this problem from occurring.  A mild laxative (prune juice, Milk of Magnesia, MiraLax, etc) should be taken according to package directions if there are no bowel movements after 48 hours.    5. Wash / shower every day.  You may shower over the dressings as they are waterproof.    6. Remove your waterproof bandages, skin tapes, and other bandages 5 days after surgery. You may replace a dressing/Band-Aid to cover the incision for comfort if you wish. You may leave the incisions open to air.  You may replace a dressing/Band-Aid to cover an incision for comfort if you wish.  Continue to shower over incision(s) after the dressing is off.  7. ACTIVITIES as tolerated:   a. You may resume regular (light) daily activities beginning the next day--such as daily self-care, walking, climbing stairs--gradually increasing activities as tolerated.  Control your pain so that you can walk an hour a day.  If you can walk 30 minutes without difficulty,  it is safe to try more intense activity such as jogging, treadmill, bicycling, low-impact aerobics, swimming, etc. b. Save the most intensive and strenuous activity for last such as sit-ups, heavy lifting, contact sports, etc  Refrain from any heavy lifting or straining until you are off narcotics for pain control.   c. DO NOT PUSH THROUGH PAIN.  Let pain be your guide: If it hurts to do something, don't do it.  Pain is your body warning you to avoid that activity for another week until the pain goes down. d. You may drive when you are no longer taking prescription pain medication, you can comfortably wear a seatbelt, and you can safely maneuver your car and apply brakes. e. Dennis Bast may have sexual intercourse when it is comfortable.   8. FOLLOW UP in our office a. Please call [facility] at [phone] to set up an appointment to see your surgeon in the office for a follow-up appointment approximately 2-3 weeks after your surgery. b. Make sure that you call for this appointment the day you arrive home to insure a convenient appointment time.  9.  If you have disability of FMLA / Family leave forms,  please bring the forms to the office for processing.  (do not give to your surgeon).  WHEN TO CALL us [phone]: 1. Poor pain control 2. Reactions / problems with new medications (rash/itching, nausea, etc)  3. Fever over 101.5 F (38.5 C) 4. Inability to urinate 5. Nausea and/or vomiting 6. Worsening swelling or bruising 7. Continued bleeding from incision. 8. Increased pain, redness, or drainage from the incision   The clinic staff is available to answer your questions during regular business hours (8:30am-5pm).  Please dont hesitate to call and ask to speak to one of our nurses for clinical concerns.   If you have a medical emergency, go to the nearest emergency room or call 911.  A surgeon from [facility] is always on call at the hospitals in [facility],  [place], [place], [place], [place]  [address] ?  [address] [address], [place], [place]   [address] MAIN: [phone] ? TOLL FREE: [phone] ? FAX: [phone] [email or web address]

## 2018-03-05 NOTE — Progress Notes (Signed)
Patient and his wife given discharge, follow up, and medication instructions, verbalized understanding, IV and JP drain removed prior to DC, personal belongings and prescription with patient, family to transport home

## 2018-03-05 NOTE — Progress Notes (Signed)
Progress Note    [name] [facility] [id number] 11/01/1945  CARE TEAM:  PCP: [name], MD  Outpatient Care Team: Patient Care Team: [name], MD as PCP - General (Internal Medicine) Dorothy Spark, MD as PCP - Cardiology (Cardiology) Kathie Rhodes, MD as Consulting Physician (Urology) Warden Fillers, MD as Consulting Physician (Optometry) Larey Dresser, MD as Consulting Physician (Cardiology) Laurence Spates, MD as Consulting Physician (Gastroenterology) Deneise Lever, MD as Consulting Physician (Pulmonary Disease) Dorothy Spark, MD as Consulting Physician (Cardiology) Michael Boston, MD as Consulting Physician (General Surgery) Ardis Hughs, MD as Attending Physician (Urology) Laurence Spates, MD as Consulting Physician (Gastroenterology)  Inpatient Treatment Team: Treatment Team: Attending Provider: Ardis Hughs, MD; Attending Physician: Ardis Hughs, MD; Consulting Physician: Michael Boston, MD; Registered Nurse: Ophelia Shoulder, RN; Technician: Layla Maw, NT; Technician: Loree Fee, NT; Registered Nurse: Arville Lime, RN; Technician: Tawanna Sat, NT; Registered Nurse: Saunders Glance, RN   Problem List:   Principal Problem:   Left renal mass s/p robotic partial nephrectomy 03/02/2018 Active Problems:   Obesity (BMI 30.0-34.9)   Recurrent incisional hernia with incarceration s/p lap repair w mesh 03/02/2018   3 Days Post-Op  03/02/2018  Procedure(s): LAPAROSCOPIC REPAIR OF INCARCERATED INCISIONAL HERNIA WITH LYSIS OF ADHESIONS XI ROBOTIC ASSITED LEFT PARTIAL NEPHRECTOMY WITH LYSIS OF ADHESIONS X30 MINUTES.  Hospital Stay = 1 days   Assessment:    Patient appears to be healing well. BM this weekend, passing flatus, and urinating without issue. Able to mobilize through the hallways and sitting in chair without issue. Incision sites are clean and healing well, no drainage from those sites.  The drain has no sign of purulence. No erythema around incisions, mild soreness.   Plan:    - Discharge today, refrain from heavy lifting, strenuous activity, or driving for now. Schedule f/u appointment in office in 2/3 weeks. - Continue daily mobilization to facilitate improved healing. - BM - Initiate use of fiber supplement to facilitate softer and regular BM. - Pain - Continue management with extra strength tylenol/ibuprofen, gabapentin, and ultram prn.     Family Communication/Anticipated D/C date and plan/Code Status   DVT prophylaxis: SCDs Code Status: Full code Family Communication: Discussed with patient, no family at bedside Disposition Plan: Home   Antimicrobials:    Anti-infectives (From admission, onward)   Start     Dose/Rate Route Frequency Ordered Stop   03/02/18 2000  ceFAZolin (ANCEF) IVPB 2g/100 mL premix     2 g 200 mL/hr over 30 Minutes Intravenous Every 8 hours 03/02/18 1636 03/03/18 0659   03/02/18 1123  ceFAZolin (ANCEF) 2-4 GM/100ML-% IVPB    Note to Pharmacy:  Bridget Hartshorn   : cabinet override      03/02/18 1123 03/02/18 2329   03/02/18 0554  ceFAZolin (ANCEF) 2-4 GM/100ML-% IVPB    Note to Pharmacy:  Waldron Session   : cabinet override      03/02/18 0554 03/02/18 1759       Subjective:    Patient feels much better today now that he has had a BM. Overall feels continual improvement and is able to ambulate through halls. Feels ready to go home.  Objective:   Vitals:   03/04/18 1341 03/04/18 2126 03/05/18 0612 03/05/18 0846  BP: 132/71 (!) 149/85 (!) 141/84   Pulse: 74 80 79   Resp: 16 20 16    Temp: 98.8 F (37.1 C) 99.3 F (37.4 C) 98.3 F (36.8  C)   TempSrc: Oral Oral Oral   SpO2: 99% 100% 96% 94%  Weight:      Height:        Last BM Date: 03/05/18  Intake/Output   Yesterday:  09/22 0701 - 09/23 0700 In: 9485 [P.O.:1160; I.V.:287; IV Piggyback:100] Out: 3100 [Urine:3025; Drains:75] This shift:  No intake/output data  recorded.  Bowel function:  Flatus: YES  BM:  YES  Drain: no purulence or clots  Physical Exam:    General: Pt awake/alert/oriented x4 in no acute distress Skin: no rashes, lesions, ulcers. No induration Eyes: PERRL, lids and conjunctivae normal ENMT: Mucous membranes are moist. No oropharyngeal exudates Neck: normal, supple, no masses, no thyromegaly Cardiovascular: Regular rate and rhythm, no murmurs / rubs / gallops. No LE edema. 2+ pedal pulses. No carotid bruits.  Respiratory: clear to auscultation bilaterally, no wheezing, no crackles. Normal respiratory effort. No accessory muscle use.  Abdomen: Somewhat firm.  Nondistended.  Mildly tender at incisions only.  No evidence of peritonitis.  No incarcerated hernias. Musculoskeletal: no clubbing / cyanosis. No joint deformity upper and lower extremities. No contractures. Normal muscle tone.  Neurologic: CN 2-12 grossly intact. Strength 5/5 in all 4.  Psychiatric: Normal judgment and insight. Alert and oriented x 3. Normal mood.     Labs:   Results for orders placed or performed during the hospital encounter of 03/02/18 (from the past 48 hour(s))  Glucose, capillary     Status: Abnormal   Collection Time: 03/03/18 12:24 PM  Result Value Ref Range   Glucose-Capillary 101 (H) 70 - 99 mg/dL  Glucose, capillary     Status: None   Collection Time: 03/03/18  5:16 PM  Result Value Ref Range   Glucose-Capillary 96 70 - 99 mg/dL  Glucose, capillary     Status: None   Collection Time: 03/03/18 11:27 PM  Result Value Ref Range   Glucose-Capillary 94 70 - 99 mg/dL   Comment 1 Notify RN   Glucose, capillary     Status: None   Collection Time: 03/04/18  8:08 AM  Result Value Ref Range   Glucose-Capillary 95 70 - 99 mg/dL  Creatinine, fluid (pleural, peritoneal, JP Drainage)     Status: None   Collection Time: 03/04/18  9:32 AM  Result Value Ref Range   Creat, Fluid 1.2 mg/dL    Comment: (NOTE) No normal range established for this  test Results should be evaluated in conjunction with serum values    Fluid Type-FCRE JP DRAINAGE     Comment: Performed at [facility], [place] [address], [place], [place] [address] CORRECTED ON 09/22 AT 1035: PREVIOUSLY REPORTED AS PERITONEAL CAVITY   Glucose, capillary     Status: None   Collection Time: 03/04/18  1:44 PM  Result Value Ref Range   Glucose-Capillary 94 70 - 99 mg/dL  Glucose, capillary     Status: Abnormal   Collection Time: 03/04/18  5:25 PM  Result Value Ref Range   Glucose-Capillary 119 (H) 70 - 99 mg/dL  Glucose, capillary     Status: Abnormal   Collection Time: 03/04/18  9:26 PM  Result Value Ref Range   Glucose-Capillary 104 (H) 70 - 99 mg/dL  Glucose, capillary     Status: None   Collection Time: 03/05/18  7:54 AM  Result Value Ref Range   Glucose-Capillary 95 70 - 99 mg/dL     Imaging / Studies: No results found.    Medication:    . bisoprolol-hydrochlorothiazide  1 tablet Oral Daily  . buPROPion  150 mg Oral Daily  . finasteride  5 mg Oral Daily  . gabapentin  300 mg Oral BID  . Influenza vac split quadrivalent PF  0.5 mL Intramuscular Tomorrow-1000  . insulin aspart  0-15 Units Subcutaneous TID WC  . lip balm  1 application Topical BID  . niacin  500 mg Oral BID  . psyllium  1 packet Oral Daily  . tamsulosin  0.4 mg Oral Daily    Continuous Infusions: . sodium chloride Stopped (03/04/18 0949)  . sodium chloride      Time spent: 15 minutes  Signed, Ave Filter, PA-S [place] PA Class of [phone]

## 2018-03-09 ENCOUNTER — Ambulatory Visit: Payer: Medicare Other | Admitting: Cardiology

## 2018-03-13 DIAGNOSIS — C642 Malignant neoplasm of left kidney, except renal pelvis: Secondary | ICD-10-CM | POA: Diagnosis not present

## 2018-03-26 ENCOUNTER — Ambulatory Visit (INDEPENDENT_AMBULATORY_CARE_PROVIDER_SITE_OTHER): Payer: Medicare Other | Admitting: Adult Health

## 2018-03-26 ENCOUNTER — Encounter: Payer: Self-pay | Admitting: Adult Health

## 2018-03-26 VITALS — BP 106/72 | HR 72 | Temp 97.1°F | Ht 67.0 in | Wt 196.0 lb

## 2018-03-26 DIAGNOSIS — I2510 Atherosclerotic heart disease of native coronary artery without angina pectoris: Secondary | ICD-10-CM | POA: Diagnosis not present

## 2018-03-26 DIAGNOSIS — L299 Pruritus, unspecified: Secondary | ICD-10-CM

## 2018-03-26 LAB — COMPLETE METABOLIC PANEL WITH GFR
AG Ratio: 1.2 (calc) (ref 1.0–2.5)
ALBUMIN MSPROF: 3.9 g/dL (ref 3.6–5.1)
ALT: 14 U/L (ref 9–46)
AST: 19 U/L (ref 10–35)
Alkaline phosphatase (APISO): 62 U/L (ref 40–115)
BILIRUBIN TOTAL: 0.4 mg/dL (ref 0.2–1.2)
BUN/Creatinine Ratio: 22 (calc) (ref 6–22)
BUN: 26 mg/dL — AB (ref 7–25)
CHLORIDE: 97 mmol/L — AB (ref 98–110)
CO2: 28 mmol/L (ref 20–32)
Calcium: 9.6 mg/dL (ref 8.6–10.3)
Creat: 1.18 mg/dL (ref 0.70–1.18)
GFR, Est African American: 70 mL/min/{1.73_m2} (ref >=60)
GFR, Est Non African American: 60 mL/min/{1.73_m2} (ref >=60)
GLUCOSE: 101 mg/dL — AB (ref 65–99)
Globulin: 3.2 g/dL (calc) (ref 1.9–3.7)
Potassium: 4.5 mmol/L (ref 3.5–5.3)
Sodium: 132 mmol/L — ABNORMAL LOW (ref 135–146)
Total Protein: 7.1 g/dL (ref 6.1–8.1)

## 2018-03-26 MED ORDER — TRIAMCINOLONE ACETONIDE 0.1 % EX OINT: 1.0000 "application " | TOPICAL_OINTMENT | Freq: Two times a day (BID) | CUTANEOUS | 1 refills | Status: AC

## 2018-03-26 NOTE — Progress Notes (Signed)
Cardiology Office Note    Date:  03/27/2018   ID:  [name], [name] Dec 24, 1945, MRN [id number]  PCP:  [name], MD  Cardiologist: Ena Dawley, MD EPS: None  Chief Complaint  Patient presents with  . Follow-up    History of Present Illness:  [name] is a 72 y.o. male with history of CAD with multiple stents in his LAD and RCA.  Last cath 12/2010 showed total occluded circumflex known from prior cast with collaterals from the RCA.  There was no other significant coronary disease EF 55%.  He is intolerant to statins.  Also has hypertension, HLD.  Patient just underwent robotic partial nephrectomy for left renal mass and repair of incisional hernia 03/02/2018.  I last saw the patient 09/2017 at which timeI increased his Ziac for hypertension and referred him to the lipid clinic for [place] but do not see that he was ever seen.  Lipid profile 02/16/2018 cholesterol 215 triglycerides 579 and non-HDL cholesterol 180 LDL not calculated.  Patient here for f/u. BP running low so cut back to 1/2 Ziac and doing better. Has gained 14 lbs since coming off the keto diet. Couldn't maintain it. Is having trouble recovering from surgery. Was under anesthesia for 7 hrs. Just starting to feel better.  No recent angina.  Rarely uses nitroglycerin.  Past Medical History:  Diagnosis Date  . Anxiety    treated /w Xanax for mild depression , using 2 times per day, not PRN  . Asthma   . Benign prostatic hypertrophy   . CAD (coronary artery disease)    pt last left heart cath was in Nov 2008. EF was 55% on left ventriculogram. Circumflex was totally occluded after the 1st obtuse marginal with collaterals supplying the distal circumflex. LAD showed luminal irregularities. The stent in LAD was patent. The RCA showed luminal irregularities. The stent in th emid RCA and PDA were patent. There were no inverventions.   . 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 Chest pain    from anxiety last time 1 month ago  . Chronic obstructive pulmonary disease (HCC)    asthma  . Cluster headache    relative to sinus problems none recnt  . Constipation   . Depression   . DM2 (diabetes mellitus, type 2) ([place])    well with diet and exercise controlled  . GERD (gastroesophageal reflux disease)    hiatal hernia. Patient did have a Nissen fundoplication rare  . Heart murmur    heard 30 yrs ago  . History of blood transfusion    need for Bld. transfusion relative to taking NSAIDS  . HTN (hypertension)    pt. followed by [facility], last cardiac visit 2012, one yr. ago  . Hyperlipidemia   . Joint pain   . Lactose intolerance   . Left kidney mass   . Low back pain    chronic  . 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 Obesity   . OSA and COPD overlap syndrome ([place]) 02/25/2015   no cpap used pt denies sleep apnea  . Osteoarthritis   . Peptic ulcer disease   . Pneumonia not recent per pt  . Sleep concern    states the study (2003 )was done at [facility] it failed & he was told to return & he  never did. Pt. states he has been found by prev. hosp. staff that he has to be told when to breathe & his wife does the same.   Marland Kitchen Spinal fracture    hx of traumatic in Jun 04, 2007 after falling off the roof  . Tinnitus, bilateral   . Vitamin D deficiency     Past Surgical History:  Procedure Laterality Date  . BACK SURGERY     x3- last surgery lumbar- 1998- / fusion   . blepheroplasty     both eyesx2  . C5-T6 posterior fusion     with [place] and radius screws  . [facility], 2008 & 2012 multiple stents  total 4 times  . cataracts     cataracts removed- /w IOL - both eyes   . CERVICAL SPINE SURGERY    . COLONOSCOPY    . ESOPHAGEAL MANOMETRY    . HARDWARE REMOVAL  02/20/2012   Procedure: HARDWARE REMOVAL;  Surgeon: [name], MD;  Location:  [facility];  Service: Neurosurgery;  Laterality: N/A;  Hardware Removal  . HIATAL HERNIA REPAIR  1979, spring 2009  . LAPAROSCOPIC CHOLECYSTECTOMY     & IOC  . multiple percutaneous coronary interventions    . NASAL SINUS SURGERY     x2, sees Dr. [name], still having problems, states he uses Benadryl PRN- up to 5 times per day   . right temporal artery biopsy    . right wrist plate insertion  54/6503  . ROBOTIC ASSITED PARTIAL NEPHRECTOMY Left 03/02/2018   Procedure: XI ROBOTIC ASSITED LEFT PARTIAL NEPHRECTOMY WITH LYSIS OF ADHESIONS X30 MINUTES.;  Surgeon: Ardis Hughs, MD;  Location: [facility];  Service: Urology;  Laterality: Left;  CLAMP TIME FOR KIDNEY 1053-1110 TOTAL 18MINUTES  . Rotator cuff surgery Right   . SKIN BIOPSY Right 10/31/2017   Chest  . TRANSURETHRAL RESECTION OF BLADDER TUMOR N/A 11/03/2017   Procedure: cystoscopy;  Surgeon: Kathie Rhodes, MD;  Location: [facility];  Service: Urology;  Laterality: N/A;  . UMBILICAL HERNIA REPAIR    . UPPER GI ENDOSCOPY    . VENTRAL HERNIA REPAIR N/A 03/02/2018   Procedure: LAPAROSCOPIC REPAIR OF INCARCERATED INCISIONAL HERNIA WITH LYSIS OF ADHESIONS;  Surgeon: Michael Boston, MD;  Location: [facility];  Service: General;  Laterality: N/A;    Current Medications: Current Meds  Medication Sig  . acetaminophen (TYLENOL) 500 MG tablet Take 1 tablet (500 mg total) by mouth every 6 (six) hours as needed.  . bisoprolol-hydrochlorothiazide (ZIAC) 10-6.25 MG tablet Take 1 tablet by mouth daily.  . Blood Glucose Monitoring Suppl (FREESTYLE FREEDOM LITE) w/Device KIT Check blood sugar 1 time a day. [id number]  . diazepam (VALIUM) 5 MG tablet Take 1/2-1 tablet 2 - 3 x /day ONLY if needed for Anxiety Attack &  limit to 5 days /week to avoid addiction  . finasteride (PROSCAR) 5 MG tablet Take 5 mg by mouth daily.  Marland Kitchen ipratropium (ATROVENT) 0.03 % nasal spray Place 1-2 sprays into both nostrils daily as needed (for allergies.).   Marland Kitchen Magnesium 500 MG TABS Take  400 mg by mouth at bedtime. Take 468m QID  . nitroGLYCERIN (NITROSTAT) 0.4 MG SL tablet Place 0.4 mg under the tongue every 5 (five) minutes x 3 doses as needed for chest pain.  . predniSONE (DELTASONE) 10 MG tablet Take 10 mg by mouth daily as needed.  . tamsulosin (FLOMAX) 0.4 MG CAPS capsule Take 1 capsule daily for Prostate  .  tetrahydrozoline-zinc (VISINE-AC) 0.05-0.25 % ophthalmic solution Place 1-2 drops into both eyes 3 (three) times daily as needed (for redness/irritation.).  Marland Kitchen triamcinolone ointment (KENALOG) 0.1 % Apply 1 application topically 2 (two) times daily.     Allergies:   Codeine; Morphine and related; Nsaids; Oxycodone; Crestor [rosuvastatin]; Cymbalta [duloxetine hcl]; Dilaudid [hydromorphone hcl]; Effexor [venlafaxine]; Gluten meal; Topamax [topiramate]; Tramadol hcl; Trazodone and nefazodone; Adhesive [tape]; Penicillins; and Sulfa antibiotics   Social History   Socioeconomic History  . Marital status: Married    Spouse name: Not on file  . Number of children: Not on file  . Years of education: Not on file  . Highest education level: Not on file  Occupational History  . Occupation: Retired  Scientific laboratory technician  . Financial resource strain: Not on file  . Food insecurity:    Worry: Not on file    Inability: Not on file  . Transportation needs:    Medical: Not on file    Non-medical: Not on file  Tobacco Use  . Smoking status: Former Smoker    Packs/day: 1.00    Years: 20.00    Pack years: 20.00    Types: Cigarettes    Last attempt to quit: 02/14/1978    Years since quitting: 40.1  . Smokeless tobacco: Never Used  Substance and Sexual Activity  . Alcohol use: Not Currently    Alcohol/week: 0.0 standard drinks    Comment: rare  . Drug use: No  . Sexual activity: Not on file  Lifestyle  . Physical activity:    Days per week: Not on file    Minutes per session: Not on file  . Stress: Not on file  Relationships  . Social connections:    Talks on phone: Not  on file    Gets together: Not on file    Attends religious service: Not on file    Active member of club or organization: Not on file    Attends meetings of clubs or organizations: Not on file    Relationship status: Not on file  Other Topics Concern  . Not on file  Social History Narrative  . Not on file     Family History:  The patient's family history includes Coronary artery disease in his father; Gout in his sister; Heart attack in his brother, brother, father, and mother; Heart failure in his mother; Hypertension in his father and mother; Obesity in his mother.   ROS:   Please see the history of present illness.    Review of Systems  Constitution: Positive for malaise/fatigue and weight gain.  HENT: Negative.   Cardiovascular: Negative.   Respiratory: Negative.   Endocrine: Negative.   Hematologic/Lymphatic: Negative.   Musculoskeletal: Negative.   Gastrointestinal: Negative.        Abdominal soreness from recent surgery  Genitourinary: Negative.   Neurological: Positive for weakness.   All other systems reviewed and are negative.   PHYSICAL EXAM:   VS:  BP 120/74   Pulse 72   Ht 5' 7"  (1.702 m)   Wt 194 lb 6.4 oz (88.2 kg)   SpO2 97%   BMI 30.45 kg/m   Physical Exam  GEN: Obese, in no acute distress  Neck: no JVD, carotid bruits, or masses Cardiac:RRR; positive S4 no murmurs, rubs  Respiratory:  clear to auscultation bilaterally, normal work of breathing GI: soft, nontender, nondistended, + BS Ext: without cyanosis, clubbing, or edema, Good distal pulses bilaterally Neuro:  Alert and Oriented x 3, Strength and  sensation are intact Psych: euthymic mood, full affect  Wt Readings from Last 3 Encounters:  03/27/18 194 lb 6.4 oz (88.2 kg)  03/26/18 196 lb (88.9 kg)  03/02/18 192 lb (87.1 kg)      Studies/Labs Reviewed:   EKG:  EKG is not ordered today.   Recent Labs: 02/16/2018: Magnesium 2.0; TSH 1.45 02/21/2018: Platelets 151 03/03/2018: Hemoglobin  13.5 03/26/2018: ALT 14; BUN 26; Creat 1.18; Potassium 4.5; Sodium 132   Lipid Panel    Component Value Date/Time   CHOL 215 (H) 02/16/2018 1203   TRIG 579 (H) 02/16/2018 1203   HDL 35 (L) 02/16/2018 1203   CHOLHDL 6.1 (H) 02/16/2018 1203   VLDL 31 (H) 01/13/2017 1038   [place]  02/16/2018 1203     Comment:     . LDL cholesterol not calculated. Triglyceride levels greater than 400 mg/dL invalidate calculated LDL results. . Reference range: <100 . Desirable range <100 mg/dL for primary prevention;   <70 mg/dL for patients with CHD or diabetic patients  with > or = 2 CHD risk factors. Marland Kitchen LDL-C is now calculated using the Martin-Hopkins  calculation, which is a validated novel method providing  better accuracy than the Friedewald equation in the  estimation of LDL-C.  Cresenciano Genre et al. Annamaria Helling. 7124;580(99): 2061-2068  (http://education.[employer].com/faq/FAQ164)    LDLDIRECT 116.0 06/03/2013 1513    Additional studies/ records that were reviewed today include:  Cardiac catheterization 12/2010  FINDINGS: 1. Hemodynamics:  LV 118/18, aorta 127/70. 2. Left ventriculography:  EF was estimated to be 55%.  There were no     regional wall motion abnormalities in the RAO projection. 3. Right coronary artery:  The right coronary was a dominant vessel.     There was a proximal-to-mid right coronary artery stent.  There was     about 25% in-stent restenosis.  There was about 30% distal RCA     stenosis.  The PLV and PDA were both moderate-sized vessels.  There     was about 40-50% ostial PLV stenosis.  The stent in the PDA was     patent.  There were luminal irregularities diffusely in the PDA.     The PLV did provide right-to-left collaterals to the distal     circumflex and also supplied 2 small-to-moderate obtuse marginals. 4. Left main:  The left main had no angiographic coronary disease. 5. Left circumflex system:  The left circumflex was totally occluded     after the first  obtuse marginal.  This was known from prior heart     catheterizations.  The first obtuse marginal was a small-to-     moderate vessel with luminal irregularities.  The distal circumflex     was reconstituted via collaterals in the PLV.  There were 2 small-     to-moderate obtuse marginals filled via the collaterals. 6. LAD system:  There was a proximal LAD stent with 30-40% in-stent     restenosis.  The remainder of the LAD had luminal irregularities.     There was a small first diagonal.  There was a moderate-sized     second diagonal that had about 40% ostial stenosis.   IMPRESSION:  The patient has a known occluded circumflex which was seen again today.  There were good collaterals from the right coronary artery system.  The patient's other vessels have no significant disease.  His stents were patent.  We will plan on medical treatment for now.  He will try Prilosec for possible  reflux symptoms.     Loralie Champagne, MD DM/MEDQ  D:  01/07/2011  T:  01/07/2011  Job:  [id number]         ASSESSMENT:    1. Atherosclerosis of native coronary artery of native heart without angina pectoris   2. Essential hypertension   3. Mixed hyperlipidemia   4. Chronic kidney disease (CKD), stage III (moderate) (HCC)   5. Obesity (BMI 30.0-34.9)      PLAN:  In order of problems listed above:  CAD status post multiple stents in the LAD and RCA.  Last cath 12/2010 with total circumflex with collaterals from RCA, EF 55%.  Hypertension on Ziac and well-controlled on half a tablet daily.  Hyperlipidemia has failed statins.  I referred to lipid clinic for [place] 09/2017 but he never went.  Most recent lipids 02/16/2018 cholesterol 215 triglycerides 579, LDL not calculated.  Refer back to the lipid clinic.  CKD stage III creatinine 1.43 03/03/2018  Obesity exercise and weight loss program such as weight watchers recommended.   Medication Adjustments/Labs and Tests Ordered: Current medicines are  reviewed at length with the patient today.  Concerns regarding medicines are outlined above.  Medication changes, Labs and Tests ordered today are listed in the Patient Instructions below. Patient Instructions  Medication Instructions:  Your physician recommends that you continue on your current medications as directed. Please refer to the Current Medication list given to you today.  If you need a refill on your cardiac medications before your next appointment, please call your pharmacy.   Lab work: None ordered  If you have labs (blood work) drawn today and your tests are completely normal, you will receive your results only by: Marland Kitchen MyChart Message (if you have MyChart) OR . A paper copy in the mail If you have any lab test that is abnormal or we need to change your treatment, we will call you to review the results.  Testing/Procedures: None ordered   You have been referred to [facility]: At [facility], you and your health needs are our priority.  As part of our continuing mission to provide you with exceptional heart care, we have created designated Provider Care Teams.  These Care Teams include your primary Cardiologist (physician) and Advanced Practice Providers (APPs -  Physician Assistants and Nurse Practitioners) who all work together to provide you with the care you need, when you need it. You will need a follow up appointment in 6 months.  Please call our office 2 months in advance to schedule this appointment.  You may see Ena Dawley, MD or one of the following Advanced Practice Providers on your designated Care Team:   [place], PA-C Melina Copa, PA-C . Ermalinda Barrios, PA-C  Any Other Special Instructions Will Be Listed Below (If Applicable). -- I recommend you try a weight loss program. Examples:  Weight Watchers -- I recommend you exercise at least 30 minutes a day      Signed, Ermalinda Barrios, PA-C  03/27/2018 9:04 AM    [facility] [place], [place], [place]  [address] Phone: [phone]; Fax: [phone]

## 2018-03-26 NOTE — Patient Instructions (Signed)
Pruritus  Pruritus is an itching feeling. There are many different conditions and factors that can make your skin itchy. Dry skin is one of the most common causes of itching. Most cases of itching do not require medical attention. Itchy skin can turn into a rash.  Follow these instructions at home:  Watch your pruritus for any changes. Take these steps to help with your condition:  Skin Care  · Moisturize your skin as needed. A moisturizer that contains petroleum jelly is best for keeping moisture in your skin.  · Take or apply medicines only as directed by your health care provider. This may include:  ? Corticosteroid cream.  ? Anti-itch lotions.  ? Oral anti-histamines.  · Apply cool compresses to the affected areas.  · Try taking a bath with:  ? Epsom salts. Follow the instructions on the packaging. You can get these at your local pharmacy or grocery store.  ? Baking soda. Pour a small amount into the bath as directed by your health care provider.  ? Colloidal oatmeal. Follow the instructions on the packaging. You can get this at your local pharmacy or grocery store.  · Try applying baking soda paste to your skin. Stir water into baking soda until it reaches a paste-like consistency.  · Do not scratch your skin.  · Avoid hot showers or baths, which can make itching worse. A cold shower may help with itching as long as you use a moisturizer after.  · Avoid scented soaps, detergents, and perfumes. Use gentle soaps, detergents, perfumes, and other cosmetic products.  General instructions  · Avoid wearing tight clothes.  · Keep a journal to help track what causes your itch. Write down:  ? What you eat.  ? What cosmetic products you use.  ? What you drink.  ? What you wear. This includes jewelry.  · Use a humidifier. This keeps the air moist, which helps to prevent dry skin.  Contact a health care provider if:  · The itching does not go away after several days.  · You sweat at night.  · You have weight loss.  · You  are unusually thirsty.  · You urinate more than normal.  · You are more tired than normal.  · You have abdominal pain.  · Your skin tingles.  · You feel weak.  · Your skin or the whites of your eyes look yellow (jaundice).  · Your skin feels numb.  This information is not intended to replace advice given to you by your health care provider. Make sure you discuss any questions you have with your health care provider.  Document Released: 02/09/2011 Document Revised: 11/05/2015 Document Reviewed: 05/26/2014  Elsevier Interactive Patient Education © 2018 Elsevier Inc.

## 2018-03-26 NOTE — Progress Notes (Signed)
Assessment and Plan:  Melanie was seen today for pruritis.  Diagnoses and all orders for this visit:  Pruritic disorder Unclear etiology, no rash Recommended he cut back supplements at home, check all skin/laundry products, keep log Recommended mild products without fragrance, can try aveeno/oatmeal baths, avoid scratching, try topical triamcinolone, can continue benadryl -     COMPLETE METABOLIC PANEL WITH GFR -     triamcinolone ointment (KENALOG) 0.1 %; Apply 1 application topically 2 (two) times daily.  Further disposition pending results of labs. Discussed med's effects and SE's.   Over 15 minutes of exam, counseling, chart review, and critical decision making was performed.   Future Appointments  Date Time Provider [place]  03/27/2018  8:30 AM Imogene Burn, PA-C [facility]  05/24/2018  9:00 AM [name], MD [facility] None  02/28/2019 11:15 AM Liane Comber, NP [facility] None    ------------------------------------------------------------------------------------------------------------------   HPI BP 106/72   Pulse 72   Temp (!) 97.1 F (36.2 C)   Ht 5' 7"  (1.702 m)   Wt 196 lb (88.9 kg)   SpO2 92%   BMI 30.70 kg/m   72 y.o.male presents for evaluation of itching through his torso x 3-4 weeks that began after his surgery on 9/20. He also itchy rash in groin intermittent for several years, that he has treated with topical vinegar, has been prescribed prescription creams and oral medications that has not been effective- may improve temporarily, but reoccurs.   He has tried benadryl and topical hydrocortisone which provide very brief temporary relief. He is moisturizing liberally, denies any new medications, lotions, bath products or detergent. He has been on prednisone taper in past recent weeks, didn't seem to make a difference.   Past Medical History:  Diagnosis Date  . Anxiety    treated /w Xanax for mild depression , using 2  times per day, not PRN  . Asthma   . Benign prostatic hypertrophy   . CAD (coronary artery disease)    pt last left heart cath was in Nov 2008. EF was 55% on left ventriculogram. Circumflex was totally occluded after the 1st obtuse marginal with collaterals supplying the distal circumflex. LAD showed luminal irregularities. The stent in LAD was patent. The RCA showed luminal irregularities. The stent in th emid RCA and PDA were patent. There were no inverventions.   . 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 Chest pain    from anxiety last time 1 month ago  . Chronic obstructive pulmonary disease (HCC)    asthma  . Cluster headache    relative to sinus problems none recnt  . Constipation   . Depression   . DM2 (diabetes mellitus, type 2) ([place])    well with diet and exercise controlled  . GERD (gastroesophageal reflux disease)    hiatal hernia. Patient did have a Nissen fundoplication rare  . Heart murmur    heard 30 yrs ago  . History of blood transfusion    need for Bld. transfusion relative to taking NSAIDS  . HTN (hypertension)    pt. followed by [facility], last cardiac visit 2012, one yr. ago  . Hyperlipidemia   . Joint pain   . Lactose intolerance   . Left kidney mass   . Low back pain    chronic  . 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 Obesity   . OSA and COPD overlap syndrome ([place]) 02/25/2015   no cpap used pt denies sleep apnea  . Osteoarthritis   . Peptic ulcer disease   . Pneumonia not recent per pt  . Sleep concern    states the study (2003 )was done at [facility]., it failed & he was told to return & he never did. Pt. states he has been found by prev. hosp. staff that he has to be told when to breathe & his wife does the same.   Marland Kitchen Spinal fracture    hx of traumatic in Jun 04, 2007 after falling off the roof  . Tinnitus, bilateral   . Vitamin D  deficiency      Allergies  Allergen Reactions  . Codeine Itching and Other (See Comments)    Also hallucinations   . Morphine And Related Other (See Comments)    Hallucinations  . Nsaids Other (See Comments)    BLEEDING--naprosyn  . Oxycodone     Hallucinations  . Crestor [Rosuvastatin] Other (See Comments)    Soreness/aching  . Cymbalta [Duloxetine Hcl] Other (See Comments)    Pt is unsure of reaction type  . Dilaudid [Hydromorphone Hcl] Itching and Other (See Comments)    Hallucinations.  . Effexor [Venlafaxine] Other (See Comments)    Unsure of reactions type  . Gluten Meal Other (See Comments)    Runny nose (constant); bloating.  . Topamax [Topiramate] Other (See Comments)    Caused visual impairments/swelling in eyes--pinch off optic nerve (temporary blindness)  . Trazodone And Nefazodone Other (See Comments)    Unsure of reaction type  . Adhesive [Tape] Other (See Comments)    SKIN PEELS--PAPER TAPE IS OKAY  . Penicillins Rash  . Sulfa Antibiotics Swelling, Rash and Other (See Comments)    Mouth sores    Current Outpatient Medications on File Prior to Visit  Medication Sig  . acetaminophen (TYLENOL) 500 MG tablet Take 1 tablet (500 mg total) by mouth every 6 (six) hours as needed. (Patient taking differently: Take 500-1,000 mg by mouth every 6 (six) hours as needed (for pain.). )  . bisoprolol-hydrochlorothiazide (ZIAC) 10-6.25 MG tablet Take 1 tablet by mouth daily.  . Blood Glucose Monitoring Suppl (FREESTYLE FREEDOM LITE) w/Device KIT Check blood sugar 1 time a day. DX-E11.21  . buPROPion (WELLBUTRIN XL) 150 MG 24 hr tablet Take 1 tablet (150 mg total) by mouth daily.  . diazepam (VALIUM) 5 MG tablet Take 1/2-1 tablet 2 - 3 x /day ONLY if needed for Anxiety Attack &  limit to 5 days /week to avoid addiction  . finasteride (PROSCAR) 5 MG tablet Take 5 mg by mouth daily.  Marland Kitchen ipratropium (ATROVENT) 0.03 % nasal spray Place 1-2 sprays into both nostrils daily as needed  (for allergies.).   Marland Kitchen Magnesium 500 MG TABS Take 400 mg by mouth at bedtime. Take 442m QID  . nitroGLYCERIN (NITROSTAT) 0.4 MG SL tablet Place 0.4 mg under the tongue every 5 (five) minutes x 3 doses as needed for chest pain.  . tamsulosin (FLOMAX) 0.4 MG CAPS capsule Take 1 capsule daily for Prostate  . tetrahydrozoline-zinc (VISINE-AC) 0.05-0.25 % ophthalmic solution Place 1-2 drops into both eyes 3 (three) times daily as needed (for redness/irritation.).  .Marland KitchentraMADol (ULTRAM) 50 MG tablet Take 1-2 tablets (50-100 mg total) by mouth every 6 (six) hours as needed for moderate pain or severe pain.   No current facility-administered medications on file prior to  visit.     ROS: all negative except above.   Physical Exam:  BP 106/72   Pulse 72   Temp (!) 97.1 F (36.2 C)   Ht 5' 7"  (1.702 m)   Wt 196 lb (88.9 kg)   SpO2 92%   BMI 30.70 kg/m   General Appearance: Well nourished, in no apparent distress. Eyes: conjunctiva no swelling or erythema ENT/Mouth: Hearing normal.  Neck: Supple Respiratory: Respiratory effort normal, BS equal bilaterally without rales, rhonchi, wheezing or stridor.  Cardio: RRR with no MRGs. Brisk peripheral pulses without edema.  Abdomen: Firm, distended + BS.  Non tender. Healing laparoscopic port sites intact without erythema/drainage/tenderness.  Lymphatics: Non tender without lymphadenopathy.  Musculoskeletal: normal gait.  Skin: Warm, dry without rashes, lesions, ecchymosis. Excoriation marks to back. Groin without inflammation/rash/discharge/lesions.  GU: uncircumcised penis without rashes/lesions.  Neuro: Sensation intact.  Psych: Awake and oriented X 3, normal affect, Insight and Judgment appropriate.     Izora Ribas, NP 4:16 PM [facility]

## 2018-03-27 ENCOUNTER — Encounter: Payer: Self-pay | Admitting: Physician Assistant

## 2018-03-27 ENCOUNTER — Ambulatory Visit (INDEPENDENT_AMBULATORY_CARE_PROVIDER_SITE_OTHER): Payer: Medicare Other | Admitting: Physician Assistant

## 2018-03-27 VITALS — BP 120/74 | HR 72 | Ht 67.0 in | Wt 194.4 lb

## 2018-03-27 DIAGNOSIS — E782 Mixed hyperlipidemia: Secondary | ICD-10-CM

## 2018-03-27 DIAGNOSIS — N183 Chronic kidney disease, stage 3 unspecified: Secondary | ICD-10-CM

## 2018-03-27 DIAGNOSIS — E66811 Obesity, class 1: Secondary | ICD-10-CM

## 2018-03-27 DIAGNOSIS — E669 Obesity, unspecified: Secondary | ICD-10-CM | POA: Diagnosis not present

## 2018-03-27 DIAGNOSIS — I10 Essential (primary) hypertension: Secondary | ICD-10-CM | POA: Diagnosis not present

## 2018-03-27 DIAGNOSIS — I2510 Atherosclerotic heart disease of native coronary artery without angina pectoris: Secondary | ICD-10-CM | POA: Diagnosis not present

## 2018-03-27 NOTE — Patient Instructions (Addendum)
Medication Instructions:  Your physician recommends that you continue on your current medications as directed. Please refer to the Current Medication list given to you today.  If you need a refill on your cardiac medications before your next appointment, please call your pharmacy.   Lab work: None ordered  If you have labs (blood work) drawn today and your tests are completely normal, you will receive your results only by: Marland Kitchen MyChart Message (if you have MyChart) OR . A paper copy in the mail If you have any lab test that is abnormal or we need to change your treatment, we will call you to review the results.  Testing/Procedures: None ordered   You have been referred to [place]: At [facility], you and your health needs are our priority.  As part of our continuing mission to provide you with exceptional heart care, we have created designated Provider Care Teams.  These Care Teams include your primary Cardiologist (physician) and Advanced Practice Providers (APPs -  Physician Assistants and Nurse Practitioners) who all work together to provide you with the care you need, when you need it. You will need a follow up appointment in 6 months.  Please call our office 2 months in advance to schedule this appointment.  You may see Ena Dawley, MD or one of the following Advanced Practice Providers on your designated Care Team:   [place], PA-C Melina Copa, PA-C . Ermalinda Barrios, PA-C  Any Other Special Instructions Will Be Listed Below (If Applicable). -- I recommend you try a weight loss program. Examples:  Weight Watchers -- I recommend you exercise at least 30 minutes a day

## 2018-04-12 ENCOUNTER — Encounter: Payer: Self-pay | Admitting: Pharmacist

## 2018-04-12 ENCOUNTER — Ambulatory Visit (INDEPENDENT_AMBULATORY_CARE_PROVIDER_SITE_OTHER): Payer: Medicare Other | Admitting: Pharmacist

## 2018-04-12 DIAGNOSIS — E782 Mixed hyperlipidemia: Secondary | ICD-10-CM | POA: Diagnosis not present

## 2018-04-12 DIAGNOSIS — I2510 Atherosclerotic heart disease of native coronary artery without angina pectoris: Secondary | ICD-10-CM

## 2018-04-12 MED ORDER — ICOSAPENT ETHYL 1 G PO CAPS: 2.0000 g | ORAL_CAPSULE | Freq: Two times a day (BID) | ORAL | 11 refills | Status: DC

## 2018-04-12 NOTE — Patient Instructions (Addendum)
Your Triglycerides are elevated above our goal <150.  STOP Lecithin and over the counter fish oil  START VASCEPA 2g TWICE daily - if this is cost prohibitive please call the clinic [phone]) and we will start Lovaza 2g TWICE daily   We will recheck your cholesterol panel in 2 months with follow up in lipid clinic.   Cholesterol Cholesterol is a fat. Your body needs a small amount of cholesterol. Cholesterol (plaque) may build up in your blood vessels (arteries). That makes you more likely to have a heart attack or stroke. You cannot feel your cholesterol level. Having a blood test is the only way to find out if your level is high. Keep your test results. Work with your doctor to keep your cholesterol at a good level. What do the results mean?  Total cholesterol is how much cholesterol is in your blood.  LDL is bad cholesterol. This is the type that can build up. Try to have low LDL.  HDL is good cholesterol. It cleans your blood vessels and carries LDL away. Try to have high HDL.  Triglycerides are fat that the body can store or burn for energy. What are good levels of cholesterol?  Total cholesterol below 200.  LDL below 100 is good for people who have health risks. LDL below 70 is good for people who have very high risks.  HDL above 40 is good. It is best to have HDL of 60 or higher.  Triglycerides below 150. How can I lower my cholesterol? Diet Follow your diet program as told by your doctor.  Choose fish, white meat chicken, or [place] that is roasted or baked. Try not to eat red meat, fried foods, sausage, or lunch meats.  Eat lots of fresh fruits and vegetables.  Choose whole grains, beans, pasta, potatoes, and cereals.  Choose olive oil, corn oil, or canola oil. Only use small amounts.  Try not to eat butter, mayonnaise, shortening, or palm kernel oils.  Try not to eat foods with trans fats.  Choose low-fat or nonfat dairy foods. ? Drink skim or nonfat  milk. ? Eat low-fat or nonfat yogurt and cheeses. ? Try not to drink whole milk or cream. ? Try not to eat ice cream, egg yolks, or full-fat cheeses.  Healthy desserts include angel food cake, ginger snaps, animal crackers, hard candy, popsicles, and low-fat or nonfat frozen yogurt. Try not to eat pastries, cakes, pies, and cookies.  Exercise Follow your exercise program as told by your doctor.  Be more active. Try gardening, walking, and taking the stairs.  Ask your doctor about ways that you can be more active.  Medicine  Take over-the-counter and prescription medicines only as told by your doctor. This information is not intended to replace advice given to you by your health care provider. Make sure you discuss any questions you have with your health care provider. Document Released: 08/26/2008 Document Revised: 12/30/2015 Document Reviewed: 12/10/2015 Elsevier Interactive Patient Education  [employer].

## 2018-04-12 NOTE — Progress Notes (Signed)
Patient ID: [name]                 DOB: 06/18/1945                    MRN: [id number]     HPI: [name] is a 72 y.o. male patient of Dr. Meda Coffee, referred by Estella Husk, PA that presents today for lipid evaluation.  PMH includes CAD with multiple stents in his LAD and RCA.  Last cath 12/2010 showed total occluded circumflex known from prior cast with collaterals from the RCA.  There was no other significant coronary disease EF 55%.  He is intolerant to statins.  Also has hypertension, HLD.  He presents today for discussion of cholesterol. He states he is here for weight management and not cholesterol. He states that he was not fasting on most recent lab results. He states that his TG have been high his whole life and he has several family members that have had heart disease or have died from heart disease.   Risk Factors: CAD s/p multiple stents  LDL Goal: <70  Current Medications: Fish oil 1257m BID,  Intolerances: Crestor, Lipitor, Pravachol, Zocor, lovastatin (soreness/aching), ezetimibe 150mdaily (cost -prohibitive), niacin (stopped due to lack of efficacy), Welchol   Diet: He eats his about half out and half at home. He does eat meat. He eats mostly chicken (baked and fried) and some red meat. He avoids red meat some times. He states that staying off red meat improved his bone pain. He has had pancreatitis. He does eat vegetables. He does not eat pasta or bread due to an intolerance to wheat. He drinks mostly water. He does drinks diet drinks occasionally.   Exercise: He does not really exercise in the last year due to health problems.   Family History: Coronary artery disease in his father; Gout in his sister; Heart attack in his brother, brother, father, and mother; Heart failure in his mother; Hypertension in his father and mother; Obesity in his mother.   Social History: former smoker, occasional alcohol  Labs: 02/16/18:  TC 215, HDL 35, TG 579, LDL unable to  calc, nonHDL 180 (no therapy)  Past Medical History:  Diagnosis Date  . Anxiety    treated /w Xanax for mild depression , using 2 times per day, not PRN  . Asthma   . Benign prostatic hypertrophy   . CAD (coronary artery disease)    pt last left heart cath was in Nov 2008. EF was 55% on left ventriculogram. Circumflex was totally occluded after the 1st obtuse marginal with collaterals supplying the distal circumflex. LAD showed luminal irregularities. The stent in LAD was patent. The RCA showed luminal irregularities. The stent in th emid RCA and PDA were patent. There were no inverventions.   . [place]   basal cell- face & head, 1 melanoma revoved from left side of face  . Chest pain    from anxiety last time 1 month ago  . Chronic obstructive pulmonary disease (HCC)    asthma  . Cluster headache    relative to sinus problems none recnt  . Constipation   . Depression   . [place]   well with diet and exercise controlled  . GERD (gastroesophageal reflux disease)    hiatal hernia. Patient did have a Nissen fundoplication rare  . Heart murmur    heard 30 yrs ago  . History of  blood transfusion    need for Bld. transfusion relative to taking NSAIDS  . HTN (hypertension)    pt. followed by [facility], last cardiac visit 2012, one yr. ago  . Hyperlipidemia   . Joint pain   . Lactose intolerance   . Left kidney mass   . Low back pain    chronic  . 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 Obesity   . OSA and COPD overlap syndrome ([place]) 02/25/2015   no cpap used pt denies sleep apnea  . Osteoarthritis   . Peptic ulcer disease   . Pneumonia not recent per pt  . Sleep concern    states the study (2003 )was done at [facility]., it failed & he was told to return & he never did. Pt. states he has been found by prev. hosp. staff that he has to be told when to breathe & his wife  does the same.   Marland Kitchen Spinal fracture    hx of traumatic in Jun 04, 2007 after falling off the roof  . Tinnitus, bilateral   . Vitamin D deficiency     Current Outpatient Medications on File Prior to Visit  Medication Sig Dispense Refill  . acetaminophen (TYLENOL) 500 MG tablet Take 1 tablet (500 mg total) by mouth every 6 (six) hours as needed. 30 tablet 0  . Alpha-Lipoic Acid 200 MG CAPS Take by mouth 2 (two) times daily.    . Ascorbic Acid (VITAMIN C) 1000 MG tablet Take 1,000 mg by mouth 2 (two) times daily.    . bisoprolol-hydrochlorothiazide (ZIAC) 10-6.25 MG tablet Take 1 tablet by mouth daily. 90 tablet 3  . Blood Glucose Monitoring Suppl (FREESTYLE FREEDOM LITE) w/Device KIT Check blood sugar 1 time a day. DX-E11.21 1 each 0  . Cholecalciferol (VITAMIN D3) 5000 units CAPS Take 5,000 Units by mouth 2 (two) times daily.    . CHOLINE PO Take 600 mg by mouth daily.    . diazepam (VALIUM) 5 MG tablet Take 1/2-1 tablet 2 - 3 x /day ONLY if needed for Anxiety Attack &  limit to 5 days /week to avoid addiction 90 tablet 0  . finasteride (PROSCAR) 5 MG tablet Take 5 mg by mouth daily.  6  . Flaxseed, Linseed, (FLAX SEED OIL PO) Take 1,200 mg by mouth 2 (two) times daily.    Marland Kitchen FOLIC ACID PO Take 235 mcg by mouth daily.    Marland Kitchen ipratropium (ATROVENT) 0.03 % nasal spray Place 1-2 sprays into both nostrils daily as needed (for allergies.).     Marland Kitchen Magnesium 500 MG TABS Take 400 mg by mouth at bedtime. Take 473m QID    . MELATONIN PO Take 30 mg by mouth at bedtime.    . Multiple Vitamins-Minerals (MULTIVITAMIN ADULT PO) Take by mouth daily.    . nitroGLYCERIN (NITROSTAT) 0.4 MG SL tablet Place 0.4 mg under the tongue every 5 (five) minutes x 3 doses as needed for chest pain.    . Omega-3 Fatty Acids (FISH OIL) 1200 MG CAPS Take by mouth 2 (two) times daily.    . predniSONE (DELTASONE) 10 MG tablet Take 10 mg by mouth daily as needed.    . SUPER B COMPLEX/C PO Take by mouth daily.    . tamsulosin  (FLOMAX) 0.4 MG CAPS capsule Take 1 capsule daily for Prostate 90 capsule 1  . tetrahydrozoline-zinc (VISINE-AC) 0.05-0.25 % ophthalmic solution  Place 1-2 drops into both eyes 3 (three) times daily as needed (for redness/irritation.).    Marland Kitchen triamcinolone ointment (KENALOG) 0.1 % Apply 1 application topically 2 (two) times daily. 80 g 1  . Turmeric 500 MG TABS Take by mouth. Takes 3 in the morning and 3 in the evening    . VALERIAN PO Take 400 mg by mouth daily.     No current facility-administered medications on file prior to visit.     Allergies  Allergen Reactions  . Codeine Itching and Other (See Comments)    Also hallucinations   . Morphine And Related Other (See Comments)    Hallucinations  . Nsaids Other (See Comments)    BLEEDING--naprosyn  . Oxycodone     Hallucinations  . Crestor [Rosuvastatin] Other (See Comments)    Soreness/aching  . Cymbalta [Duloxetine Hcl] Other (See Comments)    Pt is unsure of reaction type  . Dilaudid [Hydromorphone Hcl] Itching and Other (See Comments)    Hallucinations.  . Effexor [Venlafaxine] Other (See Comments)    Unsure of reactions type  . Gluten Meal Other (See Comments)    Runny nose (constant); bloating.  . Topamax [Topiramate] Other (See Comments)    Caused visual impairments/swelling in eyes--pinch off optic nerve (temporary blindness)  . Tramadol Hcl   . Trazodone And Nefazodone Other (See Comments)    Unsure of reaction type  . Adhesive [Tape] Other (See Comments)    SKIN PEELS--PAPER TAPE IS OKAY  . Penicillins Rash  . Sulfa Antibiotics Swelling, Rash and Other (See Comments)    Mouth sores    Assessment/Plan: Hyperlipidemia: TG not at goal <150. Will focus on TG and then LDL once we are able to evaluate. Will stop lecithin and OTC fish oil (he has been taking for cholesterol). Will start Vascepa 2g BID. He will call if cost-prohibitive and will change to Lovaza 2g BID. He will have a cholesterol panel with his PCP visit in 1  month. Will call to schedule appropriate follow up after these result.    Thank you,  Lelan Pons. Patterson Hammersmith, [facility]  04/12/2018 8:15 AM

## 2018-05-15 ENCOUNTER — Other Ambulatory Visit: Payer: Self-pay

## 2018-05-15 MED ORDER — TAMSULOSIN HCL 0.4 MG PO CAPS: ORAL_CAPSULE | ORAL | 1 refills | Status: DC

## 2018-05-23 ENCOUNTER — Encounter: Payer: Self-pay | Admitting: Internal Medicine

## 2018-05-23 NOTE — Progress Notes (Signed)
ADULT & ADOLESCENT INTERNAL MEDICINE   [name], M.D.     Uvaldo Bristle. Silverio Lay, P.A.-C Liane Comber, [place]                [address] [place], [place] [address] Telephone [phone] Telefax [phone] Comprehensive Evaluation & Examination     This very nice 72 y.o. MWM presents for a  comprehensive evaluation and management of multiple medical co-morbidities.  Patient has been followed for HTN, ASCAD, HLD, Prediabetes and Vitamin D Deficiency.     On Sept 20, patient underwent a Robotic Partial Lt Nephrectomy for a Lt Renal mass by Dr Burman Nieves & repair of a ventral hernia by Dr Neysa Bonito.         HTN predates circa 1999. In 1992 he had ACS and had PCA/Stent. In 2012 , heart cath found patent stents. Patient's BP has been controlled at home.  Today's BP is at goal - 124/68. Patient denies any cardiac symptoms as chest pain, palpitations, shortness of breath, dizziness or ankle swelling.     Patient's hyperlipidemia is controlled with diet and medications. Patient denies myalgias or other medication SE's. Last lipids were  Lab Results  Component Value Date   CHOL 215 (H) 02/16/2018   HDL 35 (L) 02/16/2018   LDLDIRECT 116.0 06/03/2013   TRIG 579 (H) 02/16/2018   CHOLHDL 6.1 (H) 02/16/2018      Patient has hx/o T2_NIDDM (2012) and after weight loss his A1c improved to 5.8%  and then 5.5% in 2014. Patient denies reactive hypoglycemic symptoms, visual blurring, diabetic polys or paresthesias. Last A1c was not at goal:  Lab Results  Component Value Date   HGBA1C 5.4 05/24/2018       Finally, patient has history of Vitamin D Deficiency ("45" / 2008 on treatment w/Vit D) and last vitamin D was at goal; Lab Results  Component Value Date   VD25OH 86 05/24/2018   Current Outpatient Medications on File Prior to Visit  Medication Sig  . acetaminophen 500 MG tablet Take 1 tablet  every 6  hours as needed.  .  Alpha-Lipoic Acid 200 MG CAPS Take  2 times daily.  Marland Kitchen VITAMIN C 1000 MG tablet Take  2  times daily.  . bisoprolol-hctz 10-6.25 MG tablet Take 1 tablet  daily - taking 0.5 tablet  . VITAMIN D 5000 units CAPS Take  2 times daily.  . CHOLINE 600 mg  Take  daily.  . diazepam  5 MG tablet Take 1/2-1 tablet 2 - 3 x /day   . DOXYLAMINE SUCCINATE  Take 1 tablet  at bedtime.  . finasteride5 MG tablet Take  daily.  Marland Kitchen FLAX SEED OIL Take 1,200 mg by mouth 2 (two) times daily.  Marland Kitchen FOLIC ACID  Take 419 mcg by mouth daily.  Vanessa Kick Ethyl (VASCEPA) 1 g CAPS Take 2 capsules  2 times daily.  . Magnesium 500 MG TABS Take 400mg  QID  . MELATONIN  30 mg  Take at bedtime.  . Multiple Vitamins-Minerals Take  daily.  Marland Kitchen NITROSTAT 0.4 MG SL tablet as needed for chest pain.  . SUPER B COMPLEX/C  Take  daily.  . tamsulosin 0.4 MG CAPS Take 1 capsule daily for Prostate  . VISINE-AC  ophth soln Place 1-2 drops into eyes 3 x /daily   . KENALOG ung 0.1 % Apply 1 application  topically 2 x /daily.  . Turmeric 500 MG  Takes 3 - morning and 3 -  evening  . VALERIAN 400 mg  Take  daily.   Allergies  Allergen Reactions  . Codeine Itching and Other (See Comments)    Also hallucinations   . Morphine And Related Other (See Comments)    Hallucinations  . Nsaids Other (See Comments)    BLEEDING--naprosyn  . Oxycodone     Hallucinations  . Crestor [Rosuvastatin] Other (See Comments)    Soreness/aching  . Cymbalta [Duloxetine Hcl] Other (See Comments)    Pt is unsure of reaction type  . Dilaudid [Hydromorphone Hcl] Itching and Other (See Comments)    Hallucinations.  . Effexor [Venlafaxine] Other (See Comments)    Unsure of reactions type  . Gluten Meal Other (See Comments)    Runny nose (constant); bloating.  . Topamax [Topiramate] Other (See Comments)    Caused visual impairments/swelling in eyes--pinch off optic nerve (temporary blindness)  . Tramadol Hcl   . Trazodone And Nefazodone Other (See Comments)     Unsure of reaction type  . Adhesive [Tape] Other (See Comments)    SKIN PEELS--PAPER TAPE IS OKAY  . Penicillins Rash  . Sulfa Antibiotics Swelling, Rash and Other (See Comments)    Mouth sores   Past Medical History:  Diagnosis Date  . Anxiety    treated /w Xanax for mild depression , using 2 times per day, not PRN  . Asthma   . Benign prostatic hypertrophy   . CAD (coronary artery disease)    pt last left heart cath was in Nov 2008. EF was 55% on left ventriculogram. Circumflex was totally occluded after the 1st obtuse marginal with collaterals supplying the distal circumflex. LAD showed luminal irregularities. The stent in LAD was patent. The RCA showed luminal irregularities. The stent in th emid RCA and PDA were patent. There were no inverventions.   . 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 Chest pain    from anxiety last time 1 month ago  . Chronic obstructive pulmonary disease (HCC)    asthma  . Cluster headache    relative to sinus problems none recnt  . Constipation   . Depression   . DM2 (diabetes mellitus, type 2) ([place])    well with diet and exercise controlled  . GERD (gastroesophageal reflux disease)    hiatal hernia. Patient did have a Nissen fundoplication rare  . Heart murmur    heard 30 yrs ago  . History of blood transfusion    need for Bld. transfusion relative to taking NSAIDS  . HTN (hypertension)    pt. followed by [facility], last cardiac visit 2012, one yr. ago  . Hyperlipidemia   . Joint pain   . Lactose intolerance   . Left kidney mass   . Low back pain    chronic  . 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 Obesity   . OSA and COPD overlap syndrome ([place]) 02/25/2015   no cpap used pt denies sleep apnea  . Osteoarthritis   . Peptic ulcer disease   . Pneumonia not recent per pt  . Sleep concern    states the study (2003 )was done  at [facility]., it failed & he was told to return & he never did. Pt. states he has  been found by prev. hosp. staff that he has to be told when to breathe & his wife does the same.   Marland Kitchen Spinal fracture    hx of traumatic in Jun 04, 2007 after falling off the roof  . Tinnitus, bilateral   . Vitamin D deficiency    Health Maintenance  Topic Date Due  . COLONOSCOPY  04/11/2021  . TETANUS/TDAP  12/08/2024  . INFLUENZA VACCINE  Completed  . Hepatitis C Screening  Completed  . PNA vac Low Risk Adult  Completed   Immunization History  Administered Date(s) Administered  . DT 12/09/2014  . Influenza Split 02/17/2012  . Influenza, High Dose Seasonal PF 03/03/2014, 03/11/2015, 04/04/2016, 04/28/2017, 03/05/2018  . Pneumococcal Conjugate-13 12/25/2014  . Pneumococcal Polysaccharide-23 01/19/2012  . Pneumococcal-Unspecified 06/13/1997  . Td 06/13/2004  . Zoster 06/14/2007   Last Colon -  04/11/2016 - Diverticulosis Dr Thomasene Lot - Recc 5 yr f/u - due Oct 2022  Past Surgical History:  Procedure Laterality Date  . BACK SURGERY     x3- last surgery lumbar- 1998- / fusion   . blepheroplasty     both eyesx2  . C5-T6 posterior fusion     with [place] and radius screws  . [place], 2008 & 2012 multiple stents  total 4 times  . cataracts     cataracts removed- /w IOL - both eyes   . CERVICAL SPINE SURGERY    . COLONOSCOPY    . ESOPHAGEAL MANOMETRY    . HARDWARE REMOVAL  02/20/2012   Procedure: HARDWARE REMOVAL;  Surgeon: [name], MD;  Location: [facility];  Service: Neurosurgery;  Laterality: N/A;  Hardware Removal  . HIATAL HERNIA REPAIR  1979, spring 2009  . LAPAROSCOPIC CHOLECYSTECTOMY     & IOC  . multiple percutaneous coronary interventions    . NASAL SINUS SURGERY     x2, sees Dr. [name], still having problems, states he uses Benadryl PRN- up to 5 times per day   . right temporal artery biopsy    . right wrist plate insertion  [id number]/7341  . ROBOTIC ASSITED  PARTIAL NEPHRECTOMY Left 03/02/2018   Procedure: XI ROBOTIC ASSITED LEFT PARTIAL NEPHRECTOMY WITH LYSIS OF ADHESIONS X30 MINUTES.;  Surgeon: Ardis Hughs, MD;  Location: [facility];  Service: Urology;  Laterality: Left;  CLAMP TIME FOR KIDNEY 1053-1110 TOTAL 18MINUTES  . Rotator cuff surgery Right   . SKIN BIOPSY Right 10/31/2017   Chest  . TRANSURETHRAL RESECTION OF BLADDER TUMOR N/A 11/03/2017   Procedure: cystoscopy;  Surgeon: Kathie Rhodes, MD;  Location: [facility];  Service: Urology;  Laterality: N/A;  . UMBILICAL HERNIA REPAIR    . UPPER GI ENDOSCOPY    . VENTRAL HERNIA REPAIR N/A 03/02/2018   Procedure: LAPAROSCOPIC REPAIR OF INCARCERATED INCISIONAL HERNIA WITH LYSIS OF ADHESIONS;  Surgeon: Michael Boston, MD;  Location: [facility];  Service: General;  Laterality: N/A;   Family History  Problem Relation Age of Onset  . Heart failure Mother        in her 31s  . Hypertension Mother   . Heart attack Mother   . Obesity Mother   . Coronary artery disease Father        developed in his 19s  . Hypertension Father   . Heart attack Father   . Heart attack Brother        in there 51s  . Heart attack Brother  in there 22s  . Gout Sister    Social History   Socioeconomic History  . Marital status: Married    Spouse name: Not on file  . Number of children: Not on file  Occupational History  . Occupation: Retired  Tobacco Use  . Smoking status: Former Smoker    Packs/day: 1.00    Years: 20.00    Pack years: 20.00    Types: Cigarettes    Last attempt to quit: 02/14/1978    Years since quitting: 40.2  . Smokeless tobacco: Never Used  Substance and Sexual Activity  . Alcohol use: Not Currently    Alcohol/week: 0.0 standard drinks    Comment: rare  . Drug use: No  . Sexual activity: Not on file    ROS Constitutional: Denies fever, chills, weight loss/gain, headaches, insomnia,  night sweats or change in appetite. Does c/o fatigue. Eyes: Denies redness, blurred vision,  diplopia, discharge, itchy or watery eyes.  ENT: Denies discharge, congestion, post nasal drip, epistaxis, sore throat, earache, hearing loss, dental pain, Tinnitus, Vertigo, Sinus pain or snoring.  Cardio: Denies chest pain, palpitations, irregular heartbeat, syncope, dyspnea, diaphoresis, orthopnea, PND, claudication or edema Respiratory: denies cough, dyspnea, DOE, pleurisy, hoarseness, laryngitis or wheezing.  Gastrointestinal: Denies dysphagia, heartburn, reflux, water brash, pain, cramps, nausea, vomiting, bloating, diarrhea, constipation, hematemesis, melena, hematochezia, jaundice or hemorrhoids Genitourinary: Denies dysuria, frequency, discharge, hematuria or flank pain. Has urgency, nocturia x 2-3 & occasional hesitancy. Musculoskeletal: Denies arthralgia, myalgia, stiffness, Jt. Swelling, pain, limp or strain/sprain. Denies Falls. Skin: Denies puritis, rash, hives, warts, acne, eczema or change in skin lesion Neuro: No weakness, tremor, incoordination, spasms, paresthesia or pain Psychiatric: Denies confusion, memory loss or sensory loss. Denies Depression. Endocrine: Denies change in weight, skin, hair change, nocturia, and paresthesia, diabetic polys, visual blurring or hyper / hypo glycemic episodes.  Heme/Lymph: No excessive bleeding, bruising or enlarged lymph nodes.  Physical Exam  BP 124/68   Pulse 72   Temp 97.9 F (36.6 C)   Resp 16   Ht 5\' 7"  (1.702 m)   Wt 197 lb (89.4 kg)   BMI 30.85 kg/m   General Appearance: Well nourished and well groomed and in no apparent distress.  Eyes: PERRLA, EOMs, conjunctiva no swelling or erythema, normal fundi and vessels. Sinuses: No frontal/maxillary tenderness ENT/Mouth: EACs patent / TMs  nl. Nares clear without erythema, swelling, mucoid exudates. Oral hygiene is good. No erythema, swelling, or exudate. Tongue normal, non-obstructing. Tonsils not swollen or erythematous. Hearing normal.  Neck: Supple, thyroid not palpable. No  bruits, nodes or JVD. Respiratory: Respiratory effort normal.  BS equal and clear bilateral without rales, rhonci, wheezing or stridor. Cardio: Heart sounds are normal with regular rate and rhythm and no murmurs, rubs or gallops. Peripheral pulses are normal and equal bilaterally without edema. No aortic or femoral bruits. Chest: symmetric with normal excursions and percussion.  Abdomen: Soft, with Nl bowel sounds. Nontender, no guarding, rebound, hernias, masses, or organomegaly.  Lymphatics: Non tender without lymphadenopathy.  Musculoskeletal: Full ROM all peripheral extremities, joint stability, 5/5 strength, and normal gait. Skin: Warm and dry without rashes, lesions, cyanosis, clubbing or  ecchymosis.  Neuro: Cranial nerves intact, reflexes equal bilaterally. Normal muscle tone, no cerebellar symptoms. Sensation intact to touch, vibratory and Monofilament to the toes bilaterally. Pysch: Alert and oriented X 3 with normal affect, insight and judgment appropriate.   Assessment and Plan  1. Essential hypertension  - EKG 12-Lead - [name], RETROPERITNL ABD,  LTD -  Urinalysis, Routine w reflex microscopic - Microalbumin / creatinine urine ratio - CBC with Differential/Platelet - COMPLETE METABOLIC PANEL WITH GFR - Magnesium - TSH  2. Hyperlipidemia, mixed  - EKG 12-Lead - [name], RETROPERITNL ABD,  LTD - Lipid panel - TSH  3. Abnormal glucose  - EKG 12-Lead - [name], RETROPERITNL ABD,  LTD - LOW EXTREMITY NEUR EXAM DOCUM - HM DIABETES FOOT EXAM - Hemoglobin A1c - Insulin, random  4. Vitamin D deficiency  - VITAMIN D 25 Hydroxyl  5. Prediabetes  - EKG 12-Lead - [name], RETROPERITNL ABD,  LTD - LOW EXTREMITY NEUR EXAM DOCUM - HM DIABETES FOOT EXAM - Hemoglobin A1c - Insulin, random  6. Gout  - Uric acid  7. Atherosclerosis of coronary artery bypass graft of native heart without angina pectoris  - EKG 12-Lead  8. OSA and COPD overlap syndrome ([place])   9. Aortic  atherosclerosis (HCC)  - [name], RETROPERITNL ABD,  LTD  10. Gastroesophageal reflux disease   11. COPD mixed type ([place])   12. Screening for colorectal cancer  - POC Hemoccult Bld/Stl   13. BPH with obstruction/lower urinary tract symptoms  - Urinalysis, Routine w reflex microscopic  14. Screening for ischemic heart disease  - EKG 12-Lead  15. Screening for AAA (aortic abdominal aneurysm)  - [name], RETROPERITNL ABD,  LTD  16. Medication management  - Urinalysis, Routine w reflex microscopic - Microalbumin / creatinine urine ratio - CBC with Differential/Platelet - COMPLETE METABOLIC PANEL WITH GFR - Magnesium - Lipid panel - TSH - Hemoglobin A1c - Insulin, random - VITAMIN D 25 Hydroxyl - Uric acid        Patient was counseled in prudent diet, weight control to achieve/maintain BMI less than 25, BP monitoring, regular exercise and medications as discussed.  Discussed med effects and SE's. Routine screening labs and tests as requested with regular follow-up as recommended. Over 40 minutes of exam, counseling, chart review and high complex critical decision making was performed

## 2018-05-23 NOTE — Patient Instructions (Signed)

## 2018-05-24 ENCOUNTER — Other Ambulatory Visit: Payer: Self-pay | Admitting: Internal Medicine

## 2018-05-24 ENCOUNTER — Ambulatory Visit (INDEPENDENT_AMBULATORY_CARE_PROVIDER_SITE_OTHER): Payer: Medicare Other | Admitting: Internal Medicine

## 2018-05-24 ENCOUNTER — Other Ambulatory Visit: Payer: Self-pay | Admitting: *Deleted

## 2018-05-24 ENCOUNTER — Encounter: Payer: Self-pay | Admitting: Internal Medicine

## 2018-05-24 VITALS — BP 124/68 | HR 72 | Temp 97.9°F | Resp 16 | Ht 67.0 in | Wt 197.0 lb

## 2018-05-24 DIAGNOSIS — N401 Enlarged prostate with lower urinary tract symptoms: Secondary | ICD-10-CM | POA: Diagnosis not present

## 2018-05-24 DIAGNOSIS — Z136 Encounter for screening for cardiovascular disorders: Secondary | ICD-10-CM | POA: Diagnosis not present

## 2018-05-24 DIAGNOSIS — Z79899 Other long term (current) drug therapy: Secondary | ICD-10-CM

## 2018-05-24 DIAGNOSIS — E782 Mixed hyperlipidemia: Secondary | ICD-10-CM | POA: Diagnosis not present

## 2018-05-24 DIAGNOSIS — R7309 Other abnormal glucose: Secondary | ICD-10-CM

## 2018-05-24 DIAGNOSIS — K219 Gastro-esophageal reflux disease without esophagitis: Secondary | ICD-10-CM

## 2018-05-24 DIAGNOSIS — Z1211 Encounter for screening for malignant neoplasm of colon: Secondary | ICD-10-CM | POA: Diagnosis not present

## 2018-05-24 DIAGNOSIS — G4733 Obstructive sleep apnea (adult) (pediatric): Secondary | ICD-10-CM | POA: Diagnosis not present

## 2018-05-24 DIAGNOSIS — M109 Gout, unspecified: Secondary | ICD-10-CM

## 2018-05-24 DIAGNOSIS — Z1212 Encounter for screening for malignant neoplasm of rectum: Secondary | ICD-10-CM

## 2018-05-24 DIAGNOSIS — R7303 Prediabetes: Secondary | ICD-10-CM

## 2018-05-24 DIAGNOSIS — E559 Vitamin D deficiency, unspecified: Secondary | ICD-10-CM | POA: Diagnosis not present

## 2018-05-24 DIAGNOSIS — I25810 Atherosclerosis of coronary artery bypass graft(s) without angina pectoris: Secondary | ICD-10-CM

## 2018-05-24 DIAGNOSIS — J449 Chronic obstructive pulmonary disease, unspecified: Secondary | ICD-10-CM | POA: Diagnosis not present

## 2018-05-24 DIAGNOSIS — I700 Atherosclerosis of aorta: Secondary | ICD-10-CM

## 2018-05-24 DIAGNOSIS — I10 Essential (primary) hypertension: Secondary | ICD-10-CM | POA: Diagnosis not present

## 2018-05-24 DIAGNOSIS — N138 Other obstructive and reflux uropathy: Secondary | ICD-10-CM

## 2018-05-24 MED ORDER — IPRATROPIUM BROMIDE 0.03 % NA SOLN: 1.0000 | Freq: Every day | NASAL | 3 refills | Status: DC | PRN

## 2018-05-26 ENCOUNTER — Encounter: Payer: Self-pay | Admitting: Internal Medicine

## 2018-05-30 ENCOUNTER — Telehealth: Payer: Self-pay | Admitting: Pharmacist

## 2018-05-30 NOTE — Telephone Encounter (Signed)
TG improved, but still above goal on Vascepa. Agree with Dr. Idell Pickles assessment and plan for low cholesterol diet and to avoid sweets, carbs, and sugary/alcoholic beverages.   LDL is also improved from previous, but also still above goal <70. Can consider starting a low dose statin (has been intolerant to Crestor, Lipitor, Pravachol, Zocor, lovastatin (soreness/aching), ezetimibe 10mg  daily (cost -prohibitive)), such as Livalo, or starting PCSK9i if patient willing.   Will call to discuss options.

## 2018-05-31 NOTE — Telephone Encounter (Signed)
Spoke with patient about cholesterol. He was aware of results from Dr. Melford Aase. He will continue to watch carbohydrates to reduce TG. He will come for a visit with the pharmacist to discuss PCSK9i therapy.

## 2018-06-04 LAB — LIPID PANEL
Cholesterol: 190 mg/dL (ref <200)
HDL: 37 mg/dL — ABNORMAL LOW (ref >40)
LDL Cholesterol (Calc): 105 mg/dL (calc) — ABNORMAL HIGH
Non-HDL Cholesterol (Calc): 153 mg/dL (calc) — ABNORMAL HIGH (ref <130)
Total CHOL/HDL Ratio: 5.1 (calc) — ABNORMAL HIGH (ref <5.0)
Triglycerides: 360 mg/dL — ABNORMAL HIGH (ref <150)

## 2018-06-04 LAB — CBC WITH DIFFERENTIAL/PLATELET
Absolute Monocytes: 576 cells/uL (ref 200–950)
Basophils Absolute: 52 cells/uL (ref 0–200)
Basophils Relative: 0.6 %
Eosinophils Absolute: 95 cells/uL (ref 15–500)
Eosinophils Relative: 1.1 %
HCT: 45.7 % (ref 38.5–50.0)
Hemoglobin: 15.8 g/dL (ref 13.2–17.1)
LYMPHS ABS: 1479 {cells}/uL (ref 850–3900)
MCH: 31.3 pg (ref 27.0–33.0)
MCHC: 34.6 g/dL (ref 32.0–36.0)
MCV: 90.7 fL (ref 80.0–100.0)
MPV: 10.3 fL (ref 7.5–12.5)
Monocytes Relative: 6.7 %
Neutro Abs: 6398 cells/uL (ref 1500–7800)
Neutrophils Relative %: 74.4 %
Platelets: 190 10*3/uL (ref 140–400)
RBC: 5.04 10*6/uL (ref 4.20–5.80)
RDW: 13.1 % (ref 11.0–15.0)
Total Lymphocyte: 17.2 %
WBC: 8.6 10*3/uL (ref 3.8–10.8)

## 2018-06-04 LAB — URINALYSIS, ROUTINE W REFLEX MICROSCOPIC
Bacteria, UA: NONE SEEN /HPF
Bilirubin Urine: NEGATIVE
Glucose, UA: NEGATIVE
Hgb urine dipstick: NEGATIVE
Hyaline Cast: NONE SEEN /LPF
KETONES UR: NEGATIVE
LEUKOCYTES UA: NEGATIVE
Nitrite: NEGATIVE
RBC / HPF: NONE SEEN /HPF (ref 0–2)
SPECIFIC GRAVITY, URINE: 1.014 (ref 1.001–1.03)
Squamous Epithelial / HPF: NONE SEEN /HPF (ref <=5)
WBC, UA: NONE SEEN /HPF (ref 0–5)
pH: 7 (ref 5.0–8.0)

## 2018-06-04 LAB — MAGNESIUM: Magnesium: 1.9 mg/dL (ref 1.5–2.5)

## 2018-06-04 LAB — COMPLETE METABOLIC PANEL WITH GFR
AG RATIO: 1.3 (calc) (ref 1.0–2.5)
ALT: 16 U/L (ref 9–46)
AST: 20 U/L (ref 10–35)
Albumin: 4.2 g/dL (ref 3.6–5.1)
Alkaline phosphatase (APISO): 59 U/L (ref 40–115)
BUN/Creatinine Ratio: 27 (calc) — ABNORMAL HIGH (ref 6–22)
BUN: 29 mg/dL — ABNORMAL HIGH (ref 7–25)
CALCIUM: 9.6 mg/dL (ref 8.6–10.3)
CO2: 26 mmol/L (ref 20–32)
Chloride: 99 mmol/L (ref 98–110)
Creat: 1.06 mg/dL (ref 0.70–1.18)
GFR, Est African American: 80 mL/min/{1.73_m2} (ref >=60)
GFR, Est Non African American: 69 mL/min/{1.73_m2} (ref >=60)
Globulin: 3.2 g/dL (calc) (ref 1.9–3.7)
Glucose, Bld: 92 mg/dL (ref 65–99)
Potassium: 4.1 mmol/L (ref 3.5–5.3)
Sodium: 136 mmol/L (ref 135–146)
Total Bilirubin: 0.4 mg/dL (ref 0.2–1.2)
Total Protein: 7.4 g/dL (ref 6.1–8.1)

## 2018-06-04 LAB — HEMOGLOBIN A1C
EAG (MMOL/L): 6 (calc)
Hgb A1c MFr Bld: 5.4 % of total Hgb (ref <5.7)
Mean Plasma Glucose: 108 (calc)

## 2018-06-04 LAB — VITAMIN D 25 HYDROXY (VIT D DEFICIENCY, FRACTURES): Vit D, 25-Hydroxy: 86 ng/mL (ref 30–100)

## 2018-06-04 LAB — MICROALBUMIN / CREATININE URINE RATIO
Creatinine, Urine: 44 mg/dL (ref 20–320)
Microalb Creat Ratio: 370 mcg/mg creat — ABNORMAL HIGH (ref <30)
Microalb, Ur: 16.3 mg/dL

## 2018-06-04 LAB — TSH: TSH: 1.56 mIU/L (ref 0.40–4.50)

## 2018-06-04 LAB — INSULIN, RANDOM: Insulin: 7.6 u[IU]/mL (ref 2.0–19.6)

## 2018-06-19 ENCOUNTER — Ambulatory Visit (INDEPENDENT_AMBULATORY_CARE_PROVIDER_SITE_OTHER): Payer: Medicare Other | Admitting: Pharmacist

## 2018-06-19 DIAGNOSIS — E782 Mixed hyperlipidemia: Secondary | ICD-10-CM | POA: Diagnosis not present

## 2018-06-19 NOTE — Progress Notes (Signed)
Patient ID: [name] [facility]                 DOB: [birth year]                    MRN: [id number]     HPI: [name] is a 73 y.o. male patient of [name], referred by Darrell Husk, PA that presents today for lipid evaluation.  PMH includes CAD with multiple stents in his LAD and RCA. Last cath 12/2010 showed total occluded circumflex known from prior cast with collaterals from the RCA.  There was no other significant coronary disease, EF 55%.  He is intolerant to statins. Also has hypertension, HLD.  At last visit patient TG were elevated and LDL was unable to be calculated. He was started on Vascepa 2g BID and was asked to stop taking OTC fish oil and lecithin. Patient did stop taking the OTC fish oil but continued to take the lecithin. In the last 2 weeks patient has decreased his Vascepa to 1g BID due to diarrhea. Although patient states that he just realized he also changed to a liquid magnesium and thinks it may have been partly due to that.  Patient states that his diet is good but does not eat many complex carbs. Patient insists that his diet is good. States that over the last 7 months or so he has had many surgeries and has not had the drive to exercise. States yesterday he was able to go for a good 20 min walk for the first time in almost a year.   Risk Factors: CAD s/p multiple stents  LDL Goal: <70  Current Medications: Vascepa 2g BID Intolerances: Crestor, Lipitor, Pravachol, Zocor, lovastatin (soreness/aching), ezetimibe 54m daily (cost -prohibitive), niacin (stopped due to lack of efficacy), Welchol   Diet: He eats his about half out and half at home. He does eat meat. He eats mostly chicken (baked and fried) and some red meat. He avoids red meat some times. He states that staying off red meat improved his bone pain. He has had pancreatitis. He does eat vegetables. He does not eat pasta or bread due to an intolerance to wheat. He drinks mostly water. He does drinks diet  drinks occasionally.   Exercise: He does not really exercise in the last year due to health problems.   Family History: Coronary artery disease in his father; Gout in his sister; Heart attack in his brother, brother, father, and mother; Heart failure in his mother; Hypertension in his father and mother; Obesity in his mother.   Social History: former smoker, occasional alcohol  Labs: 05/24/18 TC 190 HDL 37, TG 360, LDL 105, nonHDL 153 (on Vascepa 2g BID) 02/16/18:  TC 215, HDL 35, TG 579, LDL unable to calc, nonHDL 180 (no therapy)  Past Medical History:  Diagnosis Date  . Anxiety    treated /w Xanax for mild depression , using 2 times per day, not PRN  . Asthma   . Benign prostatic hypertrophy   . CAD (coronary artery disease)    pt last left heart cath was in Nov 2008. EF was 55% on left ventriculogram. Circumflex was totally occluded after the 1st obtuse marginal with collaterals supplying the distal circumflex. LAD showed luminal irregularities. The stent in LAD was patent. The RCA showed luminal irregularities. The stent in th emid RCA and PDA were patent. There were no inverventions.   . Cancer ([place]    basal cell- face & head,  1 melanoma revoved from left side of face  . Chest pain    from anxiety last time 1 month ago  . Chronic obstructive pulmonary disease (HCC)    asthma  . Cluster headache    relative to sinus problems none recnt  . Constipation   . Depression   . DM2 (diabetes mellitus, type 2) ([place])    well with diet and exercise controlled  . GERD (gastroesophageal reflux disease)    hiatal hernia. Patient did have a Nissen fundoplication rare  . Heart murmur    heard 30 yrs ago  . History of blood transfusion    need for Bld. transfusion relative to taking NSAIDS  . HTN (hypertension)    pt. followed by [facility], last cardiac visit 2012, one yr. ago  . Hyperlipidemia   . Joint pain   . Lactose intolerance   . Left kidney mass   . Low back pain     chronic  . 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 Obesity   . OSA and COPD overlap syndrome ([place]) 02/25/2015   no cpap used pt denies sleep apnea  . Osteoarthritis   . Peptic ulcer disease   . Pneumonia not recent per pt  . Sleep concern    states the study (2003 )was done at [facility] it failed & he was told to return & he never did. Pt. states he has been found by prev. hosp. staff that he has to be told when to breathe & his wife does the same.   Marland Kitchen Spinal fracture    hx of traumatic in Jun 04, 2007 after falling off the roof  . Tinnitus, bilateral   . Vitamin D deficiency     Current Outpatient Medications on File Prior to Visit  Medication Sig Dispense Refill  . acetaminophen (TYLENOL) 500 MG tablet Take 1 tablet (500 mg total) by mouth every 6 (six) hours as needed. 30 tablet 0  . Alpha-Lipoic Acid 200 MG CAPS Take by mouth 2 (two) times daily.    . Ascorbic Acid (VITAMIN C) 1000 MG tablet Take 1,000 mg by mouth 2 (two) times daily.    . bisoprolol-hydrochlorothiazide (ZIAC) 10-6.25 MG tablet Take 1 tablet by mouth daily. (Patient taking differently: Take 0.5 tablets by mouth daily. ) 90 tablet 3  . Blood Glucose Monitoring Suppl (FREESTYLE FREEDOM LITE) w/Device KIT Check blood sugar 1 time a day. DX-E11.21 1 each 0  . Cholecalciferol (VITAMIN D3) 5000 units CAPS Take 5,000 Units by mouth 2 (two) times daily.    . CHOLINE PO Take 600 mg by mouth daily.    . diazepam (VALIUM) 5 MG tablet Take 1/2-1 tablet 2 - 3 x /day ONLY if needed for Anxiety Attack &  limit to 5 days /week to avoid addiction 90 tablet 0  . DOXYLAMINE SUCCINATE PO Take 1 tablet by mouth at bedtime.    . finasteride (PROSCAR) 5 MG tablet Take 5 mg by mouth daily.  6  . Flaxseed, Linseed, (FLAX SEED OIL PO) Take 1,200 mg by mouth 2 (two) times daily.    Marland Kitchen FOLIC ACID PO Take 008 mcg by mouth daily.    Vanessa Kick Ethyl (VASCEPA) 1 g CAPS Take 2  capsules (2 g total) by mouth 2 (two) times daily. 120 capsule 11  . ipratropium (ATROVENT) 0.03 % nasal spray Place 1-2 sprays into both nostrils daily as  needed (for allergies.). 30 mL 3  . Magnesium 500 MG TABS Take 400 mg by mouth at bedtime. Take 434m QID    . MELATONIN PO Take 30 mg by mouth at bedtime.    . Multiple Vitamins-Minerals (MULTIVITAMIN ADULT PO) Take by mouth daily.    . nitroGLYCERIN (NITROSTAT) 0.4 MG SL tablet Place 0.4 mg under the tongue every 5 (five) minutes x 3 doses as needed for chest pain.    . SUPER B COMPLEX/C PO Take by mouth daily.    . tamsulosin (FLOMAX) 0.4 MG CAPS capsule Take 1 capsule daily for Prostate 90 capsule 1  . tetrahydrozoline-zinc (VISINE-AC) 0.05-0.25 % ophthalmic solution Place 1-2 drops into both eyes 3 (three) times daily as needed (for redness/irritation.).    .Marland Kitchentriamcinolone ointment (KENALOG) 0.1 % Apply 1 application topically 2 (two) times daily. 80 g 1  . Turmeric 500 MG TABS Take by mouth. Takes 3 in the morning and 3 in the evening    . VALERIAN PO Take 400 mg by mouth daily.     No current facility-administered medications on file prior to visit.     Allergies  Allergen Reactions  . Codeine Itching and Other (See Comments)    Also hallucinations   . Morphine And Related Other (See Comments)    Hallucinations  . Nsaids Other (See Comments)    BLEEDING--naprosyn  . Oxycodone     Hallucinations  . Crestor [Rosuvastatin] Other (See Comments)    Soreness/aching  . Cymbalta [Duloxetine Hcl] Other (See Comments)    Pt is unsure of reaction type  . Dilaudid [Hydromorphone Hcl] Itching and Other (See Comments)    Hallucinations.  . Effexor [Venlafaxine] Other (See Comments)    Unsure of reactions type  . Gluten Meal Other (See Comments)    Runny nose (constant); bloating.  . Topamax [Topiramate] Other (See Comments)    Caused visual impairments/swelling in eyes--pinch off optic nerve (temporary blindness)  . Tramadol Hcl     . Trazodone And Nefazodone Other (See Comments)    Unsure of reaction type  . Adhesive [Tape] Other (See Comments)    SKIN PEELS--PAPER TAPE IS OKAY  . Penicillins Rash  . Sulfa Antibiotics Swelling, Rash and Other (See Comments)    Mouth sores    Assessment/Plan:  1. Hyperlipidemia: Patient LDL is not at goal of <70. TG are improved but also not at goal. Talked with patient about trying Livalo or a PCSK9 inhibitor. Patient does not wish to try another statin due to multiple failures, but is willing to try a PCSK9 inhibitor. Explained how the medication works, inject technique, dosing and PA process. Will submit PA to insurance and determine if medication is affordable. I asked patient to stop lecithin and flaxseed oil as I'm concerned that these products might be worsening patient's triglycerides. Patient is agreeable. I also asked patient to try switching or decreasing magnesium products since they can cause diarrhea and attempting to increase Vascepa back to 2g BID for maximum effect. Further follow up to be scheduled after determining if we can start a PCSK9 inhibitor.  Thank you,   MRamond Dial Pharm.D, [place] [id number] [place] [address] Phone: [phone] Fax: [phone] 06/19/2018 2:16 PM

## 2018-06-19 NOTE — Patient Instructions (Addendum)
Stop taking lecithin and flax seed oil.  Try switching magnesium brands or decrease the amount you are taking to see if this improves your loose stools.  Attempt to increase Vascepa back to 2 capsules twice a day  I will submit a prior authorization for either Repatha or Praluent. I will call you once I have the price.  Call [phone] with any questions or concerns

## 2018-06-20 MED ORDER — EVOLOCUMAB 140 MG/ML ~~LOC~~ SOAJ: 1.0000 "pen " | SUBCUTANEOUS | 11 refills | Status: DC

## 2018-06-21 ENCOUNTER — Telehealth: Payer: Self-pay | Admitting: Pharmacist

## 2018-06-21 NOTE — Telephone Encounter (Signed)
Called pt to let him know PA was approved for repatha. Rx sent to [facility]. copay $30. Patient concerned about hitting coverage gap, but says $30 is affordable Pt to call once he gives first injection so we can set up labs

## 2018-06-26 ENCOUNTER — Other Ambulatory Visit: Payer: Self-pay

## 2018-06-26 DIAGNOSIS — Z1212 Encounter for screening for malignant neoplasm of rectum: Principal | ICD-10-CM

## 2018-06-26 DIAGNOSIS — Z1211 Encounter for screening for malignant neoplasm of colon: Secondary | ICD-10-CM

## 2018-06-26 LAB — POC HEMOCCULT BLD/STL (HOME/3-CARD/SCREEN)
Card #2 Fecal Occult Blod, POC: NEGATIVE
Card #3 Fecal Occult Blood, POC: NEGATIVE
Fecal Occult Blood, POC: NEGATIVE

## 2018-06-27 DIAGNOSIS — Z1211 Encounter for screening for malignant neoplasm of colon: Secondary | ICD-10-CM | POA: Diagnosis not present

## 2018-07-06 ENCOUNTER — Telehealth: Payer: Self-pay | Admitting: Pharmacist

## 2018-07-06 DIAGNOSIS — E785 Hyperlipidemia, unspecified: Secondary | ICD-10-CM

## 2018-07-06 NOTE — Telephone Encounter (Signed)
Reviewed for Ermalinda Barrios, PA  No other recommendations from my standpoint - maybe he would like to follow back up with PCP to discuss.   Will leave for Selinda Eon to review as well.

## 2018-07-06 NOTE — Telephone Encounter (Signed)
This encounter was created in error - please disregard.

## 2018-07-06 NOTE — Telephone Encounter (Signed)
Called pt to see if he had started taking Repatha yet which turned into patient becoming very irate regarding his medications and [place] in general.  His Repatha copay was affordable at $30 per month, however he is upset that his insurance will be charged $600 for the medication which may send him into the donut hole. He expressed the same concern with his Vascepa, which also has an affordable for him. Explained to pt that insurance companies are always charged the full rate of the medication, and that brand name medications are often high in price and we cannot change this. Advised pt that the only way to avoid this is to use generic medications, but he is already intolerant to 4 statins and ezetimibe and he does not wish to take another statin.   Explained that we could try fenofibrate for his elevated TG since this is cheaper, although it does not have the same cardiovascular benefit as Vascepa. His baseline TG are > 500 and he has a history of pancreatitis. He states that he has had elevated TG for a long time and wants [name] to look at the cause of this rather than treat it with medication. Advised pt that both lipid clinic and PCP have discussed low carb/sugar/alcohol diet which can contribute to elevated TG and that genetically high TG generally need to be treated with medication. He does not wish to take medication for his TG.  Advised pt that both Repatha and Vascepa are excellent drugs that will lower his risk of heart attack and stroke, in addition to lowering his cholesterol, and that without therapy, he would be at a higher risk of future ASCVD events. His response to this was "or so Ashley says." Tried to explain to pt that these medications are recommended by our national guidelines which are created by many cardiologists and lipidologists and that we have the same goal to keep him as healthy as possible and prevent future ASCVD events. He stated that he will not take Repatha or refill  his Vascepa. Unable to convince pt to remain on guideline recommended therapy that is also affordable for him.  He also expressed frustration with [place] in general that he was switched from Dr Aundra Dubin to Dr Meda Coffee but has not been able to see Dr Meda Coffee yet in clinic. Advised him that I would remove lipid lowering medications from his list and route to MD as an FYI.

## 2018-08-09 DIAGNOSIS — M12811 Other specific arthropathies, not elsewhere classified, right shoulder: Secondary | ICD-10-CM | POA: Diagnosis not present

## 2018-08-09 DIAGNOSIS — S46011A Strain of muscle(s) and tendon(s) of the rotator cuff of right shoulder, initial encounter: Secondary | ICD-10-CM | POA: Diagnosis not present

## 2018-08-09 DIAGNOSIS — M67912 Unspecified disorder of synovium and tendon, left shoulder: Secondary | ICD-10-CM | POA: Diagnosis not present

## 2018-08-09 DIAGNOSIS — M75121 Complete rotator cuff tear or rupture of right shoulder, not specified as traumatic: Secondary | ICD-10-CM | POA: Diagnosis not present

## 2018-08-18 ENCOUNTER — Encounter (HOSPITAL_COMMUNITY): Payer: Self-pay

## 2018-08-18 ENCOUNTER — Emergency Department (HOSPITAL_COMMUNITY)
Admission: EM | Admit: 2018-08-18 | Discharge: 2018-08-18 | Disposition: A | Discharge status: Home / Self Care | Payer: No Typology Code available for payment source | Attending: Emergency Medicine | Admitting: Emergency Medicine

## 2018-08-18 ENCOUNTER — Emergency Department (HOSPITAL_COMMUNITY): Payer: No Typology Code available for payment source

## 2018-08-18 DIAGNOSIS — Z79899 Other long term (current) drug therapy: Secondary | ICD-10-CM | POA: Insufficient documentation

## 2018-08-18 DIAGNOSIS — Y9389 Activity, other specified: Secondary | ICD-10-CM | POA: Insufficient documentation

## 2018-08-18 DIAGNOSIS — I2510 Atherosclerotic heart disease of native coronary artery without angina pectoris: Secondary | ICD-10-CM | POA: Diagnosis not present

## 2018-08-18 DIAGNOSIS — M542 Cervicalgia: Secondary | ICD-10-CM | POA: Insufficient documentation

## 2018-08-18 DIAGNOSIS — E1122 Type 2 diabetes mellitus with diabetic chronic kidney disease: Secondary | ICD-10-CM | POA: Diagnosis not present

## 2018-08-18 DIAGNOSIS — R079 Chest pain, unspecified: Secondary | ICD-10-CM | POA: Diagnosis not present

## 2018-08-18 DIAGNOSIS — C4339 Malignant melanoma of other parts of face: Secondary | ICD-10-CM | POA: Diagnosis not present

## 2018-08-18 DIAGNOSIS — Z905 Acquired absence of kidney: Secondary | ICD-10-CM | POA: Insufficient documentation

## 2018-08-18 DIAGNOSIS — R0789 Other chest pain: Secondary | ICD-10-CM | POA: Insufficient documentation

## 2018-08-18 DIAGNOSIS — Z87891 Personal history of nicotine dependence: Secondary | ICD-10-CM | POA: Insufficient documentation

## 2018-08-18 DIAGNOSIS — J45909 Unspecified asthma, uncomplicated: Secondary | ICD-10-CM | POA: Insufficient documentation

## 2018-08-18 DIAGNOSIS — S39012A Strain of muscle, fascia and tendon of lower back, initial encounter: Secondary | ICD-10-CM | POA: Insufficient documentation

## 2018-08-18 DIAGNOSIS — N2889 Other specified disorders of kidney and ureter: Secondary | ICD-10-CM | POA: Insufficient documentation

## 2018-08-18 DIAGNOSIS — Y999 Unspecified external cause status: Secondary | ICD-10-CM | POA: Diagnosis not present

## 2018-08-18 DIAGNOSIS — S60221A Contusion of right hand, initial encounter: Secondary | ICD-10-CM

## 2018-08-18 DIAGNOSIS — N183 Chronic kidney disease, stage 3 (moderate): Secondary | ICD-10-CM | POA: Insufficient documentation

## 2018-08-18 DIAGNOSIS — I129 Hypertensive chronic kidney disease with stage 1 through stage 4 chronic kidney disease, or unspecified chronic kidney disease: Secondary | ICD-10-CM | POA: Insufficient documentation

## 2018-08-18 DIAGNOSIS — M79641 Pain in right hand: Secondary | ICD-10-CM | POA: Diagnosis not present

## 2018-08-18 DIAGNOSIS — Y92410 Unspecified street and highway as the place of occurrence of the external cause: Secondary | ICD-10-CM | POA: Insufficient documentation

## 2018-08-18 DIAGNOSIS — S299XXA Unspecified injury of thorax, initial encounter: Secondary | ICD-10-CM | POA: Diagnosis not present

## 2018-08-18 DIAGNOSIS — S6991XA Unspecified injury of right wrist, hand and finger(s), initial encounter: Secondary | ICD-10-CM | POA: Diagnosis not present

## 2018-08-18 NOTE — ED Notes (Signed)
Per pfiepher, MD hold off on CT or DG of spine unless patient is experiencing tingling. Patient denies tingling. A/Ox4. Denies dizziness at this time.
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
## 2018-08-18 NOTE — Discharge Instructions (Signed)
1.  Keep your hand elevated and apply a well wrapped ice pack for about 20 minutes every few hours for the next 2 days.  You may reapply your Ace wrap if you need to for bathing and handwashing.  If your fingers are feeling tingly or cool, immediately remove your Ace wrap. 2.  Continue your Tylenol as per usual for pain control.  You may use a heating pad or warm moist heat on your back and neck for soreness. 3.  Schedule a recheck with your family doctor within the next 2 to 3 days.  Often, people have worsening pain from muscle spasm and strain between 2 to 5 days after an injury. 4.  Follow the instructions and return precautions in your discharge instructions.  Return to the emergency department if you feel that new symptoms are developing, worsening or other concerns arise.
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
## 2018-08-20 ENCOUNTER — Other Ambulatory Visit: Payer: Self-pay | Admitting: *Deleted

## 2018-08-20 MED ORDER — BISOPROLOL-HYDROCHLOROTHIAZIDE 5-6.25 MG PO TABS: 1.0000 | ORAL_TABLET | Freq: Every day | ORAL | 1 refills | Status: DC

## 2018-08-22 ENCOUNTER — Ambulatory Visit (INDEPENDENT_AMBULATORY_CARE_PROVIDER_SITE_OTHER): Payer: Medicare Other | Admitting: Internal Medicine

## 2018-08-22 ENCOUNTER — Other Ambulatory Visit: Payer: Self-pay

## 2018-08-22 VITALS — BP 124/62 | HR 72 | Temp 97.1°F | Resp 16 | Ht 67.0 in | Wt 201.8 lb

## 2018-08-22 DIAGNOSIS — J849 Interstitial pulmonary disease, unspecified: Secondary | ICD-10-CM | POA: Diagnosis not present

## 2018-08-22 DIAGNOSIS — R911 Solitary pulmonary nodule: Secondary | ICD-10-CM | POA: Diagnosis not present
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
## 2018-08-27 NOTE — Progress Notes (Addendum)
FOLLOW UP  Assessment and Plan:   CAD Control blood pressure, cholesterol, glucose, increase exercise.  Follow up cardiology  Hypertension Well controlled with current medications  Monitor blood pressure at home; patient to call if consistently greater than 130/80 Continue DASH diet.   Reminder to go to the ER if any CP, SOB, nausea, dizziness, severe HA, changes vision/speech, left arm numbness and tingling and jaw pain.  Cholesterol Currently above goal (LDL <70 for CAD); hx of multitude of intolerance to medications, followed by lipid clinic pharmacist, will forward results Continue low cholesterol diet and exercise.  Check lipid panel.   Abnormal glucose Recent A1Cs at goal Discussed diet/exercise, weight management  Defer A1C; check CMP  Overweight Long discussion about weight loss, diet, and exercise Recommended diet heavy in fruits and veggies and low in animal meats, cheeses, and dairy products, appropriate calorie intake Discussed ideal weight for height - goal is 185 lb Patient will work on improving diet again, will discuss exercise with ortho - discussed tracking macros and caloric intake Will follow up in 3 months  Vitamin D Def At goal at last visit; continue supplementation to maintain goal of 60-100 Defer Vit D level  Anxiety/insomnia Well managed by current regimen; continue melatonin; he is cutting down on valium dose Stress management techniques discussed, increase water, good sleep hygiene discussed, increase exercise, and increase veggies.   Urinary frequency Check UA today; has follow up scheduled with urology next week   Continue diet and meds as discussed. Further disposition pending results of labs. Discussed med's effects and SE's.   Over 30 minutes of exam, counseling, chart review, and critical decision making was performed.   Future Appointments  Date Time Provider [place]  08/29/2018  9:00 AM [facility] 1 [facility] [facility]   11/28/2018 10:30 AM [name], MD [facility] None  02/28/2019 11:15 AM Liane Comber, NP [facility] None  05/27/2019  9:00 AM Liane Comber, NP [facility] None    ----------------------------------------------------------------------------------------------------------------------  HPI 73 y.o. male  presents for 3 month follow up on hypertension, cholesterol, glucose management, weight and vitamin D deficiency. In 1992 he had ACS and had PCA/Stent. In 2012 , heart cath found patent stents.  On Sept 20, patient underwent a Robotic Partial Lt Nephrectomy for a Lt Renal mass by Dr Burman Nieves and repair of a ventral hernia by Dr Neysa Bonito. He has residual abdominal wall cramping since surgery, wants electrolytes checked but declines any medications to manage.   He does endorse ongoing frequency; having to urinate every 2 hours even throughout the night; he denies any other changes, difficulty initiating urine stream, split stream, straining, urgency, dysuria, changes in urine character. He is on finasteride and flomax. 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   he has a diagnosis of anxiety and insomnia and uses valium 5 mg PRN at night in conjuncture with melatonin for sleep.   BMI is Body mass index is 31.32 kg/m., he has not been working on diet and exercise due to recent surgery.  Wt Readings from Last 3 Encounters:  08/28/18 200 lb (90.7 kg)  08/22/18 201 lb 12.8 oz (91.5 kg)  05/24/18 197 lb (89.4 kg)   His blood pressure has been controlled at home, today their BP is BP: 110/64  He does not workout. He denies chest pain, shortness of breath, dizziness.   He is not on cholesterol medication (extensive history of intolerance, crestor, lipitor,  pravachol, zocor, lovastatin, ezetimibe, niacin, welchol, he is working with lipid pharmacist) and denies myalgias. His cholesterol is not at goal. The cholesterol last visit was:   Lab  Results  Component Value Date   CHOL 190 05/24/2018   HDL 37 (L) 05/24/2018   LDLCALC 105 (H) 05/24/2018   LDLDIRECT 116.0 06/03/2013   TRIG 360 (H) 05/24/2018   CHOLHDL 5.1 (H) 05/24/2018    He has not been working on diet and exercise for glucose management, and denies increased appetite, nausea, paresthesia of the feet, polydipsia, polyuria, visual disturbances, vomiting and weight loss. Last A1C in the office was:  Lab Results  Component Value Date   HGBA1C 5.4 05/24/2018   Patient is on Vitamin D supplement and above goal at recent check:  Lab Results  Component Value Date   VD25OH 86 05/24/2018        Current Medications:  Current Outpatient Medications on File Prior to Visit  Medication Sig  . acetaminophen (TYLENOL) 500 MG tablet Take 1 tablet (500 mg total) by mouth every 6 (six) hours as needed.  . Alpha-Lipoic Acid 200 MG CAPS Take by mouth 2 (two) times daily.  . Ascorbic Acid (VITAMIN C) 1000 MG tablet Take 1,000 mg by mouth 2 (two) times daily.  . bisoprolol-hydrochlorothiazide (ZIAC) 5-6.25 MG tablet Take 1 tablet by mouth daily.  . Blood Glucose Monitoring Suppl (FREESTYLE FREEDOM LITE) w/Device KIT Check blood sugar 1 time a day. [id number]  . Cholecalciferol (VITAMIN D3) 5000 units CAPS Take 5,000 Units by mouth 2 (two) times daily.  . CHOLINE PO Take 600 mg by mouth daily.  Marland Kitchen DOXYLAMINE SUCCINATE PO Take 1 tablet by mouth at bedtime.  . finasteride (PROSCAR) 5 MG tablet Take 5 mg by mouth daily.  Marland Kitchen FOLIC ACID PO Take 676 mcg by mouth daily.  Marland Kitchen ipratropium (ATROVENT) 0.03 % nasal spray Place 1-2 sprays into both nostrils daily as needed (for allergies.).  Marland Kitchen Magnesium 500 MG TABS Take 400 mg by mouth at bedtime. Take '400mg'$  QID  . MELATONIN PO Take 30 mg by mouth at bedtime.  . Multiple Vitamins-Minerals (MULTIVITAMIN ADULT PO) Take by mouth daily.  . nitroGLYCERIN (NITROSTAT) 0.4 MG SL tablet Place 0.4 mg under the tongue every 5 (five) minutes x 3 doses as  needed for chest pain.  . SUPER B COMPLEX/C PO Take by mouth daily.  . tamsulosin (FLOMAX) 0.4 MG CAPS capsule Take 1 capsule daily for Prostate  . tetrahydrozoline-zinc (VISINE-AC) 0.05-0.25 % ophthalmic solution Place 1-2 drops into both eyes 3 (three) times daily as needed (for redness/irritation.).  Marland Kitchen triamcinolone ointment (KENALOG) 0.1 % Apply 1 application topically 2 (two) times daily.  . Turmeric 500 MG TABS Take by mouth. Takes 3 in the morning and 3 in the evening  . VALERIAN PO Take 400 mg by mouth daily.   No current facility-administered medications on file prior to visit.      Allergies:  Allergies  Allergen Reactions  . Codeine Itching and Other (See Comments)    Also hallucinations   . Morphine And Related Other (See Comments)    Hallucinations  . Nsaids Other (See Comments)    BLEEDING--naprosyn  . Oxycodone     Hallucinations  . Crestor [Rosuvastatin] Other (See Comments)    Soreness/aching  . Cymbalta [Duloxetine Hcl] Other (See Comments)    Pt is unsure of reaction type  . Dilaudid [Hydromorphone Hcl] Itching and Other (See Comments)    Hallucinations.  . Effexor [Venlafaxine]  Other (See Comments)    Unsure of reactions type  . Gluten Meal Other (See Comments)    Runny nose (constant); bloating.  . Topamax [Topiramate] Other (See Comments)    Caused visual impairments/swelling in eyes--pinch off optic nerve (temporary blindness)  . Tramadol Hcl   . Trazodone And Nefazodone Other (See Comments)    Unsure of reaction type  . Adhesive [Tape] Other (See Comments)    SKIN PEELS--PAPER TAPE IS OKAY  . Penicillins Rash  . Sulfa Antibiotics Swelling, Rash and Other (See Comments)    Mouth sores     Medical History:  Past Medical History:  Diagnosis Date  . Anxiety    treated /w Xanax for mild depression , using 2 times per day, not PRN  . Asthma   . Benign prostatic hypertrophy   . CAD (coronary artery disease)    pt last left heart cath was in Nov  2008. EF was 55% on left ventriculogram. Circumflex was totally occluded after the 1st obtuse marginal with collaterals supplying the distal circumflex. LAD showed luminal irregularities. The stent in LAD was patent. The RCA showed luminal irregularities. The stent in th emid RCA and PDA were patent. There were no inverventions.   . 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 Chest pain    from anxiety last time 1 month ago  . Chronic obstructive pulmonary disease (HCC)    asthma  . Cluster headache    relative to sinus problems none recnt  . Constipation   . Depression   . DM2 (diabetes mellitus, type 2) ([place])    well with diet and exercise controlled  . GERD (gastroesophageal reflux disease)    hiatal hernia. Patient did have a Nissen fundoplication rare  . Heart murmur    heard 30 yrs ago  . History of blood transfusion    need for Bld. transfusion relative to taking NSAIDS  . HTN (hypertension)    pt. followed by [facility], last cardiac visit 2012, one yr. ago  . Hyperlipidemia   . Joint pain   . Lactose intolerance   . Left kidney mass   . Low back pain    chronic  . 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 Obesity   . OSA and COPD overlap syndrome ([place]) 02/25/2015   no cpap used pt denies sleep apnea  . Osteoarthritis   . Peptic ulcer disease   . Pneumonia not recent per pt  . Sleep concern    states the study (2003 )was done at [facility] it failed & he was told to return & he never did. Pt. states he has been found by prev. hosp. staff that he has to be told when to breathe & his wife does the same.   Marland Kitchen Spinal fracture    hx of traumatic in Jun 04, 2007 after falling off the roof  . Tinnitus, bilateral   . Vitamin D deficiency    Family history- Reviewed and unchanged Social history- Reviewed and unchanged   Review of Systems:  Review of Systems   Constitutional: Negative for chills, fever, malaise/fatigue and weight loss.  HENT: Negative for congestion, ear discharge, ear pain, hearing loss, sinus pain, sore throat and tinnitus.   Eyes: Negative for blurred vision and double vision.  Respiratory: Negative for cough, sputum production, shortness of  breath and wheezing.   Cardiovascular: Negative for chest pain, palpitations, orthopnea, claudication and leg swelling.  Gastrointestinal: Negative for abdominal pain, blood in stool, constipation, diarrhea, heartburn, melena, nausea and vomiting.  Genitourinary: Positive for frequency. Negative for dysuria, flank pain, hematuria and urgency.  Musculoskeletal: Negative for back pain, joint pain and myalgias.       Generalized cramps   Skin: Negative for rash.  Neurological: Negative for dizziness, tingling, sensory change, weakness and headaches.  Endo/Heme/Allergies: Positive for environmental allergies. Negative for polydipsia.  Psychiatric/Behavioral: Negative.  Negative for depression. The patient is not nervous/anxious and does not have insomnia.   All other systems reviewed and are negative.   Physical Exam: BP 110/64   Pulse 68   Temp (!) 97.5 F (36.4 C)   Ht 5' 7" (1.702 m)   Wt 200 lb (90.7 kg)   SpO2 96%   BMI 31.32 kg/m  Wt Readings from Last 3 Encounters:  08/28/18 200 lb (90.7 kg)  08/22/18 201 lb 12.8 oz (91.5 kg)  05/24/18 197 lb (89.4 kg)   General Appearance: Well nourished, in no apparent distress. Eyes: PERRLA, EOMs, conjunctiva no swelling or erythema Sinuses: No Frontal/maxillary tenderness ENT/Mouth: Ext aud canals clear, TMs without erythema, bulging. No erythema, swelling, or exudate on post pharynx.  Tonsils not swollen or erythematous. Hearing normal.  Neck: Supple, thyroid normal.  Respiratory: Respiratory effort normal, BS present throughout with rales to left base without rhonchi, wheezing or stridor.  Cardio: RRR with no MRGs. Brisk peripheral  pulses without edema.  Abdomen: Soft, + BS.  Non tender, no guarding, rebound, hernias, masses. Lymphatics: Non tender without lymphadenopathy.  Musculoskeletal: Full ROM, 5/5 strength, Normal gait Skin: Warm, dry without rashes, lesions, ecchymosis.  Neuro: Cranial nerves intact. No cerebellar symptoms.  Psych: Awake and oriented X 3, normal affect, Insight and Judgment appropriate.    Izora Ribas, NP 10:55 AM [facility]

## 2018-08-28 ENCOUNTER — Other Ambulatory Visit: Payer: Self-pay

## 2018-08-28 ENCOUNTER — Ambulatory Visit (INDEPENDENT_AMBULATORY_CARE_PROVIDER_SITE_OTHER): Payer: Medicare Other | Admitting: Adult Health

## 2018-08-28 ENCOUNTER — Encounter: Payer: Self-pay | Admitting: Adult Health

## 2018-08-28 VITALS — BP 110/64 | HR 68 | Temp 97.5°F | Ht 67.0 in | Wt 200.0 lb

## 2018-08-28 DIAGNOSIS — J449 Chronic obstructive pulmonary disease, unspecified: Secondary | ICD-10-CM | POA: Diagnosis not present

## 2018-08-28 DIAGNOSIS — Z79899 Other long term (current) drug therapy: Secondary | ICD-10-CM | POA: Diagnosis not present

## 2018-08-28 DIAGNOSIS — E559 Vitamin D deficiency, unspecified: Secondary | ICD-10-CM

## 2018-08-28 DIAGNOSIS — K219 Gastro-esophageal reflux disease without esophagitis: Secondary | ICD-10-CM | POA: Diagnosis not present

## 2018-08-28 DIAGNOSIS — I2510 Atherosclerotic heart disease of native coronary artery without angina pectoris: Secondary | ICD-10-CM | POA: Diagnosis not present

## 2018-08-28 DIAGNOSIS — R350 Frequency of micturition: Secondary | ICD-10-CM | POA: Diagnosis not present

## 2018-08-28 DIAGNOSIS — I10 Essential (primary) hypertension: Secondary | ICD-10-CM

## 2018-08-28 DIAGNOSIS — E669 Obesity, unspecified: Secondary | ICD-10-CM | POA: Diagnosis not present

## 2018-08-28 DIAGNOSIS — G4733 Obstructive sleep apnea (adult) (pediatric): Secondary | ICD-10-CM | POA: Diagnosis not present

## 2018-08-28 DIAGNOSIS — N183 Chronic kidney disease, stage 3 unspecified: Secondary | ICD-10-CM

## 2018-08-28 DIAGNOSIS — E785 Hyperlipidemia, unspecified: Secondary | ICD-10-CM | POA: Diagnosis not present

## 2018-08-28 DIAGNOSIS — R7309 Other abnormal glucose: Secondary | ICD-10-CM

## 2018-08-28 MED ORDER — CLOPIDOGREL BISULFATE 75 MG PO TABS: 75.0000 mg | ORAL_TABLET | Freq: Every day | ORAL | 1 refills | Status: DC

## 2018-08-28 NOTE — Patient Instructions (Addendum)
Goals    . Exercise 3x per week (10 min per time)    . LDL CALC < 70    . Weight (lb) < 180 lb (81.6 kg)       Deconditioning Deconditioning refers to the changes in your body that occur during a period of inactivity. The changes happen in your heart, lungs, and muscles. They decrease your ability to be active, and they make you feel tired and weak. There are three stages of deconditioning:  Mild deconditioning. At this stage, you will notice a change in your ability to do your usual exercise activities, such as running, biking, or swimming.  Moderate deconditioning. At this stage, you will notice a change in your ability to do normal everyday activities, such as walking, grocery shopping, and doing chores.  Severe deconditioning. At this stage, you will notice a change in your ability to do minimal activity or normal self-care. Deconditioning can occur after only a few days of inactivity. The longer the period of inactivity, the more severe the deconditioning will be, and the longer it will take to return to your previous level of functioning. What are the causes? Deconditioning is often caused by inactivity due to:  Illnesses, such as cancer, stroke, heart attack, fibromyalgia, and chronic fatigue syndrome.  Injuries, especially back injuries, broken bones, and ligament and tendon injuries.  A long stay in the hospital.  Pregnancy, especially if long periods of bed rest are needed. What increases the risk? This condition is more likely to develop in:  People who are hospitalized.  People on bed rest.  People who are obese.  People with poor nutrition.  Elderly adults.  People with injuries or illnesses that interfere with movement and activity. What are the signs or symptoms? Symptoms of deconditioning include:  Weakness.  Tiredness.  Shortness of breath with minor exertion.  A faster-than-normal heartbeat. You may not notice this without taking your pulse.  Pain  or discomfort with activity.  Decreased strength.  Decreased sense of balance.  Decreased endurance.  Difficulty doing your usual forms of exercise.  Difficulty doing activities of daily living, such as grocery shopping or chores.  Difficulty walking around the house and doing basic self-care, such as getting to the bathroom, preparing meals, or doing laundry. How is this diagnosed? Deconditioning is diagnosed based on your medical history and a physical exam. During the physical exam, your health care provider will check for signs of deconditioning, such as:  Decreased size of muscles.  Decreased strength.  Trouble with balance.  Shortness of breath or abnormally increased heart rate after minor exertion. How is this treated? Treatment for deconditioning usually involves following a structured exercise program in which activity is increased gradually. Your health care provider will determine which exercises are right for you. The exercise program will likely include aerobic exercise and strength training:  Aerobic exercise helps improve the functioning of the heart and lungs as well as the muscles.  Strength training helps improve muscle size and strength. Both of these types of exercise will improve your endurance. You may be referred to a physical therapist who can create a safe strengthening program for you to follow. Follow these instructions at home:  Follow the exercise program that is recommended by your health care provider or physical therapist.  Do not increase your exercise any faster than directed.  Eat a healthy diet.  Do not use any products that contain nicotine or tobacco, such as cigarettes and e-cigarettes. If you need  help quitting, ask your health care provider.  Take over-the-counter and prescription medicines only as told by your health care provider.  Keep all follow-up visits as told by your health care provider. This is important. Contact a health  care provider if:  You are not able to carry out the prescribed exercise program.  You are becoming more and more fatigued and weak.  You become light-headed when rising to a sitting or standing position.  Your level of endurance decreases after it has improved. Get help right away if:  You have chest pain.  You are very short of breath.  You have any episodes of passing out. This information is not intended to replace advice given to you by your health care provider. Make sure you discuss any questions you have with your health care provider. Document Released: 10/14/2013 Document Revised: 12/18/2015 Document Reviewed: 08/29/2015 Elsevier Interactive Patient Education  2019 [employer]     Muscle Cramps and Spasms Muscle cramps and spasms occur when a muscle or muscles tighten and you have no control over this tightening (involuntary muscle contraction). They are a common problem and can develop in any muscle. The most common place is in the calf muscles of the leg. Muscle cramps and muscle spasms are both involuntary muscle contractions, but there are some differences between the two:  Muscle cramps are painful. They come and go and may last for a few seconds or up to 15 minutes. Muscle cramps are often more forceful and last longer than muscle spasms.  Muscle spasms may or may not be painful. They may also last just a few seconds or much longer. Certain medical conditions, such as diabetes or Parkinson's disease, can make it more likely to develop cramps or spasms. However, cramps or spasms are usually not caused by a serious underlying problem. Common causes include:  Doing more physical work or exercise than your body is ready for (overexertion).  Overuse from repeating certain movements too many times.  Remaining in a certain position for a long period of time.  Improper preparation, form, or technique while playing a sport or doing an  activity.  Dehydration.  Injury.  Side effects of some medicines.  Abnormally low levels of the salts and minerals in your blood (electrolytes), especially potassium and calcium. This could happen if you are taking water pills (diuretics) or if you are pregnant. In many cases, the cause of muscle cramps or spasms is not known. Follow these instructions at home: Managing pain and stiffness      Try massaging, stretching, and relaxing the affected muscle. Do this for several minutes at a time.  If directed, apply heat to tight or tense muscles as often as told by your health care provider. Use the heat source that your health care provider recommends, such as a moist heat pack or a heating pad. ? Place a towel between your skin and the heat source. ? Leave the heat on for 20-30 minutes. ? Remove the heat if your skin turns bright red. This is especially important if you are unable to feel pain, heat, or cold. You may have a greater risk of getting burned.  If directed, put ice on the affected area. This may help if you are sore or have pain after a cramp or spasm. ? Put ice in a plastic bag. ? Place a towel between your skin and the bag. ? Leavethe ice on for 20 minutes, 2-3 times a day.  Try taking hot showers  or baths to help relax tight muscles. Eating and drinking  Drink enough fluid to keep your urine pale yellow. Staying well hydrated may help prevent cramps or spasms.  Eat a healthy diet that includes plenty of nutrients to help your muscles function. A healthy diet includes fruits and vegetables, lean protein, whole grains, and low-fat or nonfat dairy products. General instructions  If you are having frequent cramps, avoid intense exercise for several days.  Take over-the-counter and prescription medicines only as told by your health care provider.  Pay attention to any changes in your symptoms.  Keep all follow-up visits as told by your health care provider. This is  important. Contact a health care provider if:  Your cramps or spasms get more severe or happen more often.  Your cramps or spasms do not improve over time. Summary  Muscle cramps and spasms occur when a muscle or muscles tighten and you have no control over this tightening (involuntary muscle contraction).  The most common place for cramps or spasms to occur is in the calf muscles of the leg.  Massaging, stretching, and relaxing the affected muscle may relieve the cramp or spasm.  Drink enough fluid to keep your urine pale yellow. Staying well hydrated may help prevent cramps or spasms. This information is not intended to replace advice given to you by your health care provider. Make sure you discuss any questions you have with your health care provider. Document Released: 11/19/2001 Document Revised: 10/23/2017 Document Reviewed: 10/23/2017 Elsevier Interactive Patient Education  2019 [employer].

## 2018-08-29 ENCOUNTER — Other Ambulatory Visit: Payer: Self-pay

## 2018-08-29 ENCOUNTER — Other Ambulatory Visit: Payer: Self-pay | Admitting: Internal Medicine

## 2018-08-29 ENCOUNTER — Other Ambulatory Visit: Payer: Self-pay | Admitting: Adult Health

## 2018-08-29 ENCOUNTER — Ambulatory Visit
Admission: RE | Admit: 2018-08-29 | Discharge: 2018-08-29 | Disposition: A | Discharge status: Home / Self Care | Payer: Medicare Other | Source: Ambulatory Visit | Attending: Internal Medicine | Admitting: Internal Medicine

## 2018-08-29 DIAGNOSIS — R911 Solitary pulmonary nodule: Secondary | ICD-10-CM | POA: Diagnosis not present

## 2018-08-29 DIAGNOSIS — J449 Chronic obstructive pulmonary disease, unspecified: Secondary | ICD-10-CM

## 2018-08-29 DIAGNOSIS — J849 Interstitial pulmonary disease, unspecified: Secondary | ICD-10-CM

## 2018-08-29 LAB — CBC WITH DIFFERENTIAL/PLATELET
Absolute Monocytes: 598 cells/uL (ref 200–950)
Basophils Absolute: 52 cells/uL (ref 0–200)
Basophils Relative: 0.8 %
Eosinophils Absolute: 117 cells/uL (ref 15–500)
Eosinophils Relative: 1.8 %
HCT: 46.1 % (ref 38.5–50.0)
Hemoglobin: 15.5 g/dL (ref 13.2–17.1)
Lymphs Abs: 1937 cells/uL (ref 850–3900)
MCH: 30.4 pg (ref 27.0–33.0)
MCHC: 33.6 g/dL (ref 32.0–36.0)
MCV: 90.4 fL (ref 80.0–100.0)
MPV: 10 fL (ref 7.5–12.5)
Monocytes Relative: 9.2 %
Neutro Abs: 3796 cells/uL (ref 1500–7800)
Neutrophils Relative %: 58.4 %
Platelets: 161 10*3/uL (ref 140–400)
RBC: 5.1 10*6/uL (ref 4.20–5.80)
RDW: 13.5 % (ref 11.0–15.0)
Total Lymphocyte: 29.8 %
WBC: 6.5 10*3/uL (ref 3.8–10.8)

## 2018-08-29 LAB — COMPLETE METABOLIC PANEL WITH GFR
AG Ratio: 1.3 (calc) (ref 1.0–2.5)
ALT: 17 U/L (ref 9–46)
AST: 23 U/L (ref 10–35)
Albumin: 3.9 g/dL (ref 3.6–5.1)
Alkaline phosphatase (APISO): 59 U/L (ref 35–144)
BUN/Creatinine Ratio: 23 (calc) — ABNORMAL HIGH (ref 6–22)
BUN: 27 mg/dL — ABNORMAL HIGH (ref 7–25)
CO2: 25 mmol/L (ref 20–32)
Calcium: 9.6 mg/dL (ref 8.6–10.3)
Chloride: 97 mmol/L — ABNORMAL LOW (ref 98–110)
Creat: 1.18 mg/dL (ref 0.70–1.18)
GFR, Est African American: 70 mL/min/{1.73_m2} (ref >=60)
GFR, Est Non African American: 60 mL/min/{1.73_m2} (ref >=60)
Globulin: 2.9 g/dL (calc) (ref 1.9–3.7)
Glucose, Bld: 100 mg/dL — ABNORMAL HIGH (ref 65–99)
Potassium: 4.3 mmol/L (ref 3.5–5.3)
Sodium: 132 mmol/L — ABNORMAL LOW (ref 135–146)
Total Bilirubin: 0.5 mg/dL (ref 0.2–1.2)
Total Protein: 6.8 g/dL (ref 6.1–8.1)

## 2018-08-29 LAB — LIPID PANEL
Cholesterol: 184 mg/dL (ref <200)
HDL: 46 mg/dL (ref >=40)
LDL CHOLESTEROL (CALC): 100 mg/dL — AB
Non-HDL Cholesterol (Calc): 138 mg/dL (calc) — ABNORMAL HIGH (ref <130)
Total CHOL/HDL Ratio: 4 (calc) (ref <5.0)
Triglycerides: 265 mg/dL — ABNORMAL HIGH (ref <150)

## 2018-08-29 LAB — URINALYSIS W MICROSCOPIC + REFLEX CULTURE
Bacteria, UA: NONE SEEN /HPF
Bilirubin Urine: NEGATIVE
GLUCOSE, UA: NEGATIVE
Hgb urine dipstick: NEGATIVE
Hyaline Cast: NONE SEEN /LPF
Ketones, ur: NEGATIVE
Leukocyte Esterase: NEGATIVE
NITRITES URINE, INITIAL: NEGATIVE
RBC / HPF: NONE SEEN /HPF (ref 0–2)
Specific Gravity, Urine: 1.016 (ref 1.001–1.03)
Squamous Epithelial / HPF: NONE SEEN /HPF (ref <=5)
WBC, UA: NONE SEEN /HPF (ref 0–5)
pH: 6 (ref 5.0–8.0)

## 2018-08-29 LAB — TSH: TSH: 1.7 mIU/L (ref 0.40–4.50)

## 2018-08-29 LAB — NO CULTURE INDICATED

## 2018-08-29 LAB — MAGNESIUM: Magnesium: 1.9 mg/dL (ref 1.5–2.5)

## 2018-08-29 MED ORDER — DIAZEPAM 5 MG PO TABS: ORAL_TABLET | ORAL | 0 refills | Status: DC

## 2018-08-30 ENCOUNTER — Telehealth: Payer: Self-pay | Admitting: Pharmacist

## 2018-08-30 MED ORDER — ROSUVASTATIN CALCIUM 5 MG PO TABS: 5.0000 mg | ORAL_TABLET | ORAL | 3 refills | Status: DC

## 2018-08-30 NOTE — Telephone Encounter (Signed)
Attempted to call patient to discuss recent lipid panel. Left message to return call.

## 2018-08-30 NOTE — Telephone Encounter (Signed)
Labs were forwarded from patients PCP. Patient was seen in lipid clinic in January. Repatha was approved for patient, but he later decided he did not want it because his insurance would be charged too much. Copay was affordable, however. Patient has not been on any cholesterol medications.  Called and spoke with patient TG are better than 3 months ago. These were not fasting labs, therefore TG may actually be lower.  Spoke with patient about trying rosuvastatin 5mg  once-twice weekly. Explained that this can be an effective in lowers LDL and is tolerated in a lot of people who cannot tolerate rosuvastatin daily. Patient instructed to start taking 5mg  once a week. If tolerating after a few weeks, increase to twice a week. Patient agreeable to try. Patient is not interested in medication for his TG. Patient states he has a lot of health issues going on and he is very leary of his medications. Patient TG are not dangerously high. Therefore I think it is acceptable to just focus on LDL for now. (rosuvastatin will lower TG a small amount as well) Will recheck lipid panel in 3 months. Patient agreeable to the plan.

## 2018-08-31 ENCOUNTER — Ambulatory Visit (INDEPENDENT_AMBULATORY_CARE_PROVIDER_SITE_OTHER): Payer: Medicare Other | Admitting: Internal Medicine

## 2018-08-31 ENCOUNTER — Other Ambulatory Visit: Payer: Self-pay

## 2018-08-31 DIAGNOSIS — J449 Chronic obstructive pulmonary disease, unspecified: Secondary | ICD-10-CM | POA: Diagnosis not present

## 2018-08-31 DIAGNOSIS — R911 Solitary pulmonary nodule: Secondary | ICD-10-CM

## 2018-08-31 LAB — PULMONARY FUNCTION TEST
DL/VA % pred: 92 %
DL/VA: 3.74 ml/min/mmHg/L
DLCO COR: 19.11 ml/min/mmHg
DLCO cor % pred: 83 %
DLCO unc % pred: 85 %
DLCO unc: 19.58 ml/min/mmHg
FEF 25-75 PRE: 2.17 L/s
FEF 25-75 Post: 2.94 L/sec
FEF2575-%Change-Post: 35 %
FEF2575-%PRED-PRE: 108 %
FEF2575-%Pred-Post: 147 %
FEV1-%Change-Post: 8 %
FEV1-%Pred-Post: 93 %
FEV1-%Pred-Pre: 86 %
FEV1-PRE: 2.36 L
FEV1-Post: 2.56 L
FEV1FVC-%Change-Post: 3 %
FEV1FVC-%PRED-PRE: 106 %
FEV6-%CHANGE-POST: 4 %
FEV6-%Pred-Post: 90 %
FEV6-%Pred-Pre: 86 %
FEV6-Post: 3.19 L
FEV6-Pre: 3.04 L
FEV6FVC-%Pred-Post: 106 %
FEV6FVC-%Pred-Pre: 106 %
FVC-%Change-Post: 4 %
FVC-%Pred-Post: 84 %
FVC-%Pred-Pre: 80 %
FVC-Post: 3.19 L
FVC-Pre: 3.04 L
Post FEV1/FVC ratio: 80 %
Post FEV6/FVC ratio: 100 %
Pre FEV1/FVC ratio: 78 %
Pre FEV6/FVC Ratio: 100 %
RV % pred: 118 %
RV: 2.82 L
TLC % PRED: 98 %
TLC: 6.3 L

## 2018-08-31 NOTE — Progress Notes (Signed)
Full PFT performed. 

## 2018-09-04 ENCOUNTER — Other Ambulatory Visit: Payer: Self-pay | Admitting: Internal Medicine

## 2018-09-04 ENCOUNTER — Ambulatory Visit (HOSPITAL_COMMUNITY)
Admission: RE | Admit: 2018-09-04 | Discharge: 2018-09-04 | Disposition: A | Discharge status: Home / Self Care | Payer: Medicare Other | Source: Ambulatory Visit | Attending: Internal Medicine | Admitting: Internal Medicine

## 2018-09-04 ENCOUNTER — Other Ambulatory Visit: Payer: Self-pay

## 2018-09-04 DIAGNOSIS — J439 Emphysema, unspecified: Secondary | ICD-10-CM | POA: Insufficient documentation

## 2018-09-04 DIAGNOSIS — I700 Atherosclerosis of aorta: Secondary | ICD-10-CM | POA: Diagnosis not present

## 2018-09-04 DIAGNOSIS — M899 Disorder of bone, unspecified: Secondary | ICD-10-CM | POA: Insufficient documentation

## 2018-09-04 DIAGNOSIS — R911 Solitary pulmonary nodule: Secondary | ICD-10-CM | POA: Insufficient documentation

## 2018-09-04 DIAGNOSIS — Z905 Acquired absence of kidney: Secondary | ICD-10-CM | POA: Insufficient documentation

## 2018-09-04 DIAGNOSIS — R948 Abnormal results of function studies of other organs and systems: Secondary | ICD-10-CM

## 2018-09-04 DIAGNOSIS — I2510 Atherosclerotic heart disease of native coronary artery without angina pectoris: Secondary | ICD-10-CM | POA: Insufficient documentation

## 2018-09-04 DIAGNOSIS — N2889 Other specified disorders of kidney and ureter: Secondary | ICD-10-CM

## 2018-09-04 LAB — GLUCOSE, CAPILLARY: Glucose-Capillary: 85 mg/dL (ref 70–99)

## 2018-09-04 MED ORDER — FLUDEOXYGLUCOSE F - 18 (FDG) INJECTION
10.1100 | Freq: Once | INTRAVENOUS | Status: AC | PRN
  Administered 2018-09-04: 10.11 via INTRAVENOUS

## 2018-09-05 ENCOUNTER — Telehealth: Payer: Self-pay | Admitting: Internal Medicine

## 2018-09-05 NOTE — Telephone Encounter (Signed)
A new patient appt has been scheduled for the pt to see Dr. Julien Nordmann on 3/31 at 2:15pm with labs at 1:45pm. Pt has been cld and made aware to the appt date and time.

## 2018-09-10 ENCOUNTER — Other Ambulatory Visit: Payer: Self-pay | Admitting: Physician Assistant

## 2018-09-10 DIAGNOSIS — R911 Solitary pulmonary nodule: Secondary | ICD-10-CM

## 2018-09-11 ENCOUNTER — Other Ambulatory Visit: Payer: Self-pay

## 2018-09-11 ENCOUNTER — Inpatient Hospital Stay: Payer: Medicare Other | Attending: Internal Medicine | Admitting: Internal Medicine

## 2018-09-11 ENCOUNTER — Encounter: Payer: Self-pay | Admitting: Internal Medicine

## 2018-09-11 ENCOUNTER — Inpatient Hospital Stay: Payer: Medicare Other

## 2018-09-11 VITALS — BP 142/71 | HR 69 | Temp 98.5°F | Resp 18 | Ht 67.0 in | Wt 203.2 lb

## 2018-09-11 DIAGNOSIS — M899 Disorder of bone, unspecified: Secondary | ICD-10-CM | POA: Diagnosis not present

## 2018-09-11 DIAGNOSIS — E119 Type 2 diabetes mellitus without complications: Secondary | ICD-10-CM | POA: Insufficient documentation

## 2018-09-11 DIAGNOSIS — Z87891 Personal history of nicotine dependence: Secondary | ICD-10-CM | POA: Insufficient documentation

## 2018-09-11 DIAGNOSIS — C642 Malignant neoplasm of left kidney, except renal pelvis: Secondary | ICD-10-CM

## 2018-09-11 DIAGNOSIS — R911 Solitary pulmonary nodule: Secondary | ICD-10-CM

## 2018-09-11 DIAGNOSIS — I10 Essential (primary) hypertension: Secondary | ICD-10-CM | POA: Diagnosis not present

## 2018-09-11 DIAGNOSIS — N2889 Other specified disorders of kidney and ureter: Secondary | ICD-10-CM

## 2018-09-11 DIAGNOSIS — Z85528 Personal history of other malignant neoplasm of kidney: Secondary | ICD-10-CM | POA: Insufficient documentation

## 2018-09-11 DIAGNOSIS — Z905 Acquired absence of kidney: Secondary | ICD-10-CM | POA: Diagnosis not present

## 2018-09-11 DIAGNOSIS — C7951 Secondary malignant neoplasm of bone: Secondary | ICD-10-CM

## 2018-09-11 LAB — CMP (CANCER CENTER ONLY)
ALT: 18 U/L (ref 0–44)
AST: 23 U/L (ref 15–41)
Albumin: 3.6 g/dL (ref 3.5–5.0)
Alkaline Phosphatase: 83 U/L (ref 38–126)
Anion gap: 12 (ref 5–15)
BUN: 28 mg/dL — AB (ref 8–23)
CO2: 23 mmol/L (ref 22–32)
CREATININE: 1.17 mg/dL (ref 0.61–1.24)
Calcium: 9 mg/dL (ref 8.9–10.3)
Chloride: 96 mmol/L — ABNORMAL LOW (ref 98–111)
GFR, Est AFR Am: >60 mL/min (ref >60)
Glucose, Bld: 102 mg/dL — ABNORMAL HIGH (ref 70–99)
Potassium: 4 mmol/L (ref 3.5–5.1)
Sodium: 131 mmol/L — ABNORMAL LOW (ref 135–145)
Total Bilirubin: 0.4 mg/dL (ref 0.3–1.2)
Total Protein: 7.4 g/dL (ref 6.5–8.1)

## 2018-09-11 LAB — CBC WITH DIFFERENTIAL (CANCER CENTER ONLY)
Abs Immature Granulocytes: 0.02 10*3/uL (ref 0.00–0.07)
Basophils Absolute: 0.1 10*3/uL (ref 0.0–0.1)
Basophils Relative: 1 %
EOS PCT: 2 %
Eosinophils Absolute: 0.1 10*3/uL (ref 0.0–0.5)
HCT: 43.6 % (ref 39.0–52.0)
Hemoglobin: 14.9 g/dL (ref 13.0–17.0)
Immature Granulocytes: 0 %
Lymphocytes Relative: 25 %
Lymphs Abs: 1.8 10*3/uL (ref 0.7–4.0)
MCH: 31 pg (ref 26.0–34.0)
MCHC: 34.2 g/dL (ref 30.0–36.0)
MCV: 90.6 fL (ref 80.0–100.0)
Monocytes Absolute: 0.6 10*3/uL (ref 0.1–1.0)
Monocytes Relative: 8 %
NRBC: 0 % (ref 0.0–0.2)
Neutro Abs: 4.7 10*3/uL (ref 1.7–7.7)
Neutrophils Relative %: 64 %
Platelet Count: 154 10*3/uL (ref 150–400)
RBC: 4.81 MIL/uL (ref 4.22–5.81)
RDW: 13.9 % (ref 11.5–15.5)
WBC: 7.3 10*3/uL (ref 4.0–10.5)

## 2018-09-11 NOTE — Addendum Note (Signed)
Addended by: Aura Fey A on: 09/11/2018 04:59 PM   Modules accepted: Orders
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
## 2018-09-12 ENCOUNTER — Encounter: Payer: Self-pay | Admitting: Internal Medicine

## 2018-09-13 ENCOUNTER — Telehealth: Payer: Self-pay | Admitting: Internal Medicine

## 2018-09-13 NOTE — Telephone Encounter (Signed)
Called regarding 4/14

## 2018-09-17 ENCOUNTER — Other Ambulatory Visit: Payer: Self-pay | Admitting: Medical Oncology

## 2018-09-17 DIAGNOSIS — R972 Elevated prostate specific antigen [PSA]: Secondary | ICD-10-CM | POA: Diagnosis not present

## 2018-09-17 DIAGNOSIS — C642 Malignant neoplasm of left kidney, except renal pelvis: Secondary | ICD-10-CM | POA: Diagnosis not present

## 2018-09-18 ENCOUNTER — Other Ambulatory Visit: Payer: Self-pay | Admitting: Radiology

## 2018-09-20 ENCOUNTER — Encounter (HOSPITAL_COMMUNITY): Payer: Self-pay

## 2018-09-20 ENCOUNTER — Other Ambulatory Visit: Payer: Self-pay

## 2018-09-20 ENCOUNTER — Ambulatory Visit (HOSPITAL_COMMUNITY)
Admission: RE | Admit: 2018-09-20 | Discharge: 2018-09-20 | Disposition: A | Discharge status: Home / Self Care | Payer: Medicare Other | Source: Ambulatory Visit | Attending: Internal Medicine | Admitting: Internal Medicine

## 2018-09-20 DIAGNOSIS — F419 Anxiety disorder, unspecified: Secondary | ICD-10-CM | POA: Insufficient documentation

## 2018-09-20 DIAGNOSIS — F329 Major depressive disorder, single episode, unspecified: Secondary | ICD-10-CM | POA: Insufficient documentation

## 2018-09-20 DIAGNOSIS — N400 Enlarged prostate without lower urinary tract symptoms: Secondary | ICD-10-CM | POA: Insufficient documentation

## 2018-09-20 DIAGNOSIS — E785 Hyperlipidemia, unspecified: Secondary | ICD-10-CM | POA: Diagnosis not present

## 2018-09-20 DIAGNOSIS — C3491 Malignant neoplasm of unspecified part of right bronchus or lung: Secondary | ICD-10-CM | POA: Diagnosis not present

## 2018-09-20 DIAGNOSIS — M8588 Other specified disorders of bone density and structure, other site: Secondary | ICD-10-CM | POA: Diagnosis not present

## 2018-09-20 DIAGNOSIS — I700 Atherosclerosis of aorta: Secondary | ICD-10-CM | POA: Diagnosis not present

## 2018-09-20 DIAGNOSIS — J45909 Unspecified asthma, uncomplicated: Secondary | ICD-10-CM | POA: Diagnosis not present

## 2018-09-20 DIAGNOSIS — Z85828 Personal history of other malignant neoplasm of skin: Secondary | ICD-10-CM | POA: Insufficient documentation

## 2018-09-20 DIAGNOSIS — Z885 Allergy status to narcotic agent status: Secondary | ICD-10-CM | POA: Insufficient documentation

## 2018-09-20 DIAGNOSIS — J439 Emphysema, unspecified: Secondary | ICD-10-CM | POA: Insufficient documentation

## 2018-09-20 DIAGNOSIS — Z87891 Personal history of nicotine dependence: Secondary | ICD-10-CM | POA: Diagnosis not present

## 2018-09-20 DIAGNOSIS — E119 Type 2 diabetes mellitus without complications: Secondary | ICD-10-CM | POA: Insufficient documentation

## 2018-09-20 DIAGNOSIS — Z91018 Allergy to other foods: Secondary | ICD-10-CM | POA: Insufficient documentation

## 2018-09-20 DIAGNOSIS — C7951 Secondary malignant neoplasm of bone: Secondary | ICD-10-CM | POA: Insufficient documentation

## 2018-09-20 DIAGNOSIS — I2510 Atherosclerotic heart disease of native coronary artery without angina pectoris: Secondary | ICD-10-CM | POA: Insufficient documentation

## 2018-09-20 DIAGNOSIS — M4326 Fusion of spine, lumbar region: Secondary | ICD-10-CM | POA: Insufficient documentation

## 2018-09-20 DIAGNOSIS — Z79899 Other long term (current) drug therapy: Secondary | ICD-10-CM | POA: Insufficient documentation

## 2018-09-20 DIAGNOSIS — Z8553 Personal history of malignant neoplasm of renal pelvis: Secondary | ICD-10-CM | POA: Diagnosis not present

## 2018-09-20 DIAGNOSIS — C3490 Malignant neoplasm of unspecified part of unspecified bronchus or lung: Secondary | ICD-10-CM | POA: Diagnosis not present

## 2018-09-20 DIAGNOSIS — E669 Obesity, unspecified: Secondary | ICD-10-CM | POA: Diagnosis not present

## 2018-09-20 DIAGNOSIS — M898X8 Other specified disorders of bone, other site: Secondary | ICD-10-CM | POA: Diagnosis not present

## 2018-09-20 DIAGNOSIS — I10 Essential (primary) hypertension: Secondary | ICD-10-CM | POA: Diagnosis not present

## 2018-09-20 DIAGNOSIS — Z905 Acquired absence of kidney: Secondary | ICD-10-CM | POA: Insufficient documentation

## 2018-09-20 DIAGNOSIS — M899 Disorder of bone, unspecified: Secondary | ICD-10-CM | POA: Insufficient documentation

## 2018-09-20 DIAGNOSIS — E739 Lactose intolerance, unspecified: Secondary | ICD-10-CM | POA: Diagnosis not present

## 2018-09-20 DIAGNOSIS — Z888 Allergy status to other drugs, medicaments and biological substances status: Secondary | ICD-10-CM | POA: Insufficient documentation

## 2018-09-20 DIAGNOSIS — Z886 Allergy status to analgesic agent status: Secondary | ICD-10-CM | POA: Diagnosis not present

## 2018-09-20 DIAGNOSIS — R911 Solitary pulmonary nodule: Secondary | ICD-10-CM | POA: Diagnosis not present

## 2018-09-20 DIAGNOSIS — Z882 Allergy status to sulfonamides status: Secondary | ICD-10-CM | POA: Insufficient documentation

## 2018-09-20 DIAGNOSIS — Z880 Allergy status to penicillin: Secondary | ICD-10-CM | POA: Insufficient documentation

## 2018-09-20 DIAGNOSIS — C3412 Malignant neoplasm of upper lobe, left bronchus or lung: Secondary | ICD-10-CM | POA: Diagnosis not present

## 2018-09-20 LAB — CBC WITH DIFFERENTIAL/PLATELET
Abs Immature Granulocytes: 0.03 10*3/uL (ref 0.00–0.07)
Basophils Absolute: 0.1 10*3/uL (ref 0.0–0.1)
Basophils Relative: 1 %
Eosinophils Absolute: 0.2 10*3/uL (ref 0.0–0.5)
Eosinophils Relative: 3 %
HCT: 44.6 % (ref 39.0–52.0)
Hemoglobin: 15.4 g/dL (ref 13.0–17.0)
Immature Granulocytes: 1 %
Lymphocytes Relative: 32 %
Lymphs Abs: 2 10*3/uL (ref 0.7–4.0)
MCH: 31.8 pg (ref 26.0–34.0)
MCHC: 34.5 g/dL (ref 30.0–36.0)
MCV: 92.1 fL (ref 80.0–100.0)
Monocytes Absolute: 0.6 10*3/uL (ref 0.1–1.0)
Monocytes Relative: 11 %
Neutro Abs: 3.2 10*3/uL (ref 1.7–7.7)
Neutrophils Relative %: 52 %
Platelets: 158 10*3/uL (ref 150–400)
RBC: 4.84 MIL/uL (ref 4.22–5.81)
RDW: 14.2 % (ref 11.5–15.5)
WBC: 6.1 10*3/uL (ref 4.0–10.5)
nRBC: 0 % (ref 0.0–0.2)

## 2018-09-20 LAB — GLUCOSE, CAPILLARY: Glucose-Capillary: 100 mg/dL — ABNORMAL HIGH (ref 70–99)

## 2018-09-20 LAB — PROTIME-INR
INR: 1.1 (ref 0.8–1.2)
Prothrombin Time: 14 seconds (ref 11.4–15.2)

## 2018-09-20 MED ORDER — FENTANYL CITRATE (PF) 100 MCG/2ML IJ SOLN
INTRAMUSCULAR | Status: AC
  Filled 2018-09-20: qty 2

## 2018-09-20 MED ORDER — LIDOCAINE HCL (PF) 1 % IJ SOLN
INTRAMUSCULAR | Status: AC | PRN
  Administered 2018-09-20: 10 mL

## 2018-09-20 MED ORDER — MIDAZOLAM HCL 2 MG/2ML IJ SOLN
INTRAMUSCULAR | Status: AC
  Filled 2018-09-20: qty 2

## 2018-09-20 MED ORDER — FENTANYL CITRATE (PF) 100 MCG/2ML IJ SOLN
INTRAMUSCULAR | Status: AC | PRN
  Administered 2018-09-20 (×2): 50 ug via INTRAVENOUS

## 2018-09-20 MED ORDER — NALOXONE HCL 0.4 MG/ML IJ SOLN
INTRAMUSCULAR | Status: AC
  Filled 2018-09-20: qty 1

## 2018-09-20 MED ORDER — FLUMAZENIL 0.5 MG/5ML IV SOLN
INTRAVENOUS | Status: AC
  Filled 2018-09-20: qty 5

## 2018-09-20 MED ORDER — MIDAZOLAM HCL 2 MG/2ML IJ SOLN
INTRAMUSCULAR | Status: AC | PRN
  Administered 2018-09-20 (×3): 1 mg via INTRAVENOUS

## 2018-09-20 MED ORDER — SODIUM CHLORIDE 0.9 % IV SOLN
INTRAVENOUS | Status: DC
  Administered 2018-09-20: 10:00:00 via INTRAVENOUS

## 2018-09-20 NOTE — Consult Note (Addendum)
Chief Complaint: Patient was seen in consultation today for CT-guided biopsy of left iliac crest lytic lesion  Referring Physician(s): Mohamed,Mohamed  Supervising Physician: Aletta Edouard  Patient Status: [facility] - Out-pt  History of Present Illness: [name] is a 73 y.o. 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  Recent PET scan has revealed:  1. Left upper lobe pulmonary nodule exhibits moderate increased uptake and is worrisome for primary bronchogenic carcinoma. No hypermetabolic hilar or mediastinal adenopathy. 2. There is a bandlike area of increased soft tissue surrounding the left renal artery and vein. This is nonspecific and could conceivably represent postoperative change status post partial left nephrectomy. Residual/recurrent renal cell carcinoma not excluded. 3. Hypermetabolic lytic lesion identified involving the left iliac bone is new from 10/11/2017. Worrisome for osseous metastasis. Hypermetabolic sclerotic lesion within the posterior left iliac bone is also new in concerning for metastasis. Increased uptake within the anterior right seventh rib is noted which may represent an area of metastasis or nondisplaced rib fracture. Within the tip of the clivus there is a small focus of increased uptake. Can not rule out metastatic disease. 4. Aortic Atherosclerosis (ICD10-I70.0) and Emphysema (ICD10-J43.9). 5. Multi vessel coronary artery calcifications.  He presents today for CT-guided left iliac crest lytic lesion biopsy for further evaluation.  Past Medical History:  Diagnosis Date  . Anxiety    treated /w Xanax for mild depression , using 2 times per day, not PRN  . Asthma   . Benign prostatic hypertrophy   . CAD (coronary artery disease)    pt  last left heart cath was in Nov 2008. EF was 55% on left ventriculogram. Circumflex was totally occluded after the 1st obtuse marginal with collaterals supplying the distal circumflex. LAD showed luminal irregularities. The stent in LAD was patent. The RCA showed luminal irregularities. The stent in th emid RCA and PDA were patent. There were no inverventions.   . 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 Chest pain    from anxiety last time 1 month ago  . Chronic obstructive pulmonary disease (HCC)    asthma  . Cluster headache    relative to sinus problems none recnt  . Constipation   . Depression   . DM2 (diabetes mellitus, type 2) ([place])    well with diet and exercise controlled  . GERD (gastroesophageal reflux disease)    hiatal hernia. Patient did have a Nissen fundoplication rare  . Heart murmur    heard 30 yrs ago  . History of blood transfusion    need for Bld. transfusion relative to taking NSAIDS  . HTN (hypertension)    pt. followed by [facility], last cardiac visit 2012, one yr. ago  . Hyperlipidemia   . Joint pain   . Lactose intolerance   . Left kidney mass   . Low back pain    chronic  . 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 Obesity   . OSA and COPD overlap syndrome ([place]) 02/25/2015   no cpap used pt denies sleep apnea  . Osteoarthritis   . Peptic ulcer disease   . Pneumonia not recent per pt  . Sleep concern    states  the study (2003 )was done at [facility] it failed & he was told to return & he never did. Pt. states he has been found by prev. hosp. staff that he has to be told when to breathe & his wife does the same.   Marland Kitchen Spinal fracture    hx of traumatic in Jun 04, 2007 after falling off the roof  . Tinnitus, bilateral   . Vitamin D deficiency     Past Surgical History:  Procedure Laterality Date  . BACK SURGERY     x3- last surgery  lumbar- 1998- / fusion   . blepheroplasty     both eyesx2  . C5-T6 posterior fusion     with [place] and radius screws  . [place], 2008 & 2012 multiple stents  total 4 times  . cataracts     cataracts removed- /w IOL - both eyes   . CERVICAL SPINE SURGERY    . COLONOSCOPY    . ESOPHAGEAL MANOMETRY    . HARDWARE REMOVAL  02/20/2012   Procedure: HARDWARE REMOVAL;  Surgeon: [name], MD;  Location: [facility];  Service: Neurosurgery;  Laterality: N/A;  Hardware Removal  . HIATAL HERNIA REPAIR  1979, spring 2009  . LAPAROSCOPIC CHOLECYSTECTOMY     & IOC  . multiple percutaneous coronary interventions    . NASAL SINUS SURGERY     x2, sees Dr. [name], still having problems, states he uses Benadryl PRN- up to 5 times per day   . right temporal artery biopsy    . right wrist plate insertion  [id number]/6144  . ROBOTIC ASSITED PARTIAL NEPHRECTOMY Left 03/02/2018   Procedure: XI ROBOTIC ASSITED LEFT PARTIAL NEPHRECTOMY WITH LYSIS OF ADHESIONS X30 MINUTES.;  Surgeon: Ardis Hughs, MD;  Location: [facility];  Service: Urology;  Laterality: Left;  CLAMP TIME FOR KIDNEY 1053-1110 TOTAL 18MINUTES  . Rotator cuff surgery Right   . SKIN BIOPSY Right 10/31/2017   Chest  . TRANSURETHRAL RESECTION OF BLADDER TUMOR N/A 11/03/2017   Procedure: cystoscopy;  Surgeon: Kathie Rhodes, MD;  Location: [facility];  Service: Urology;  Laterality: N/A;  . UMBILICAL HERNIA REPAIR    . UPPER GI ENDOSCOPY    . VENTRAL HERNIA REPAIR N/A 03/02/2018   Procedure: LAPAROSCOPIC REPAIR OF INCARCERATED INCISIONAL HERNIA WITH LYSIS OF ADHESIONS;  Surgeon: Michael Boston, MD;  Location: [facility];  Service: General;  Laterality: N/A;    Allergies: Codeine; Morphine and related; Nsaids; Oxycodone; Crestor [rosuvastatin]; Cymbalta [duloxetine hcl]; Dilaudid [hydromorphone hcl]; Effexor [venlafaxine]; Gluten meal; Topamax [topiramate]; Tramadol hcl; Trazodone and nefazodone; Adhesive [tape]; Penicillins; and Sulfa  antibiotics  Medications: Prior to Admission medications   Medication Sig Start Date End Date Taking? Authorizing Provider  acetaminophen (TYLENOL) 500 MG tablet Take 1 tablet (500 mg total) by mouth every 6 (six) hours as needed. 02/28/17  Yes Beasley, Caren D, MD  Alpha-Lipoic Acid 200 MG CAPS Take by mouth 2 (two) times daily.   Yes [provider]  Ascorbic Acid (VITAMIN C) 1000 MG tablet Take 1,000 mg by mouth 2 (two) times daily.   Yes [provider]  bisoprolol-hydrochlorothiazide (ZIAC) 5-6.25 MG tablet Take 1 tablet by mouth daily. 08/20/18  Yes [name], MD  Cholecalciferol (VITAMIN D3) 5000 units CAPS Take 5,000 Units by mouth 2 (two) times daily.   Yes [provider]  CHOLINE PO Take 600 mg by mouth daily.   Yes [provider]  diazepam (VALIUM) 5  MG tablet TAKE 1/2 TO 1 TABLET BY MOUTH TWICE DAILY ONLY IF NEEDED FOR ANXIETY ATTACK AND LIMIT TO 5 DAYS A WEEK TO AVOID ADDICTION 08/29/18  Yes Liane Comber, NP  DOXYLAMINE SUCCINATE PO Take 1 tablet by mouth at bedtime.   Yes [provider]  FOLIC ACID PO Take 295 mcg by mouth daily.   Yes [provider]  ipratropium (ATROVENT) 0.03 % nasal spray Place 1-2 sprays into both nostrils daily as needed (for allergies.). 05/24/18  Yes [name], MD  Magnesium 500 MG TABS Take 400 mg by mouth at bedtime. Take 454m QID   Yes [provider]  MELATONIN PO Take 30 mg by mouth at bedtime.   Yes [provider]  Multiple Vitamins-Minerals (MULTIVITAMIN ADULT PO) Take by mouth daily.   Yes [provider]  SUPER B COMPLEX/C PO Take by mouth daily.   Yes [provider]  tamsulosin (FLOMAX) 0.4 MG CAPS capsule Take 1 capsule daily for Prostate 05/15/18 05/15/19 Yes MUnk Pinto MD  tetrahydrozoline-zinc (VISINE-AC) 0.05-0.25 % ophthalmic solution Place 1-2 drops into both eyes 3 (three) times daily as needed (for redness/irritation.).   Yes  [provider]  Turmeric 500 MG TABS Take by mouth. Takes 3 in the morning and 3 in the evening   Yes [provider]  VALERIAN PO Take 400 mg by mouth daily.   Yes [provider]  Blood Glucose Monitoring Suppl (FREESTYLE FREEDOM LITE) w/Device KIT Check blood sugar 1 time a day. DJO-A41.667/25/19   MUnk Pinto MD  clopidogrel (PLAVIX) 75 MG tablet Take 1 tablet (75 mg total) by mouth daily after breakfast. 08/28/18   CLiane Comber NP  nitroGLYCERIN (NITROSTAT) 0.4 MG SL tablet Place 0.4 mg under the tongue every 5 (five) minutes x 3 doses as needed for chest pain.    [provider]  rosuvastatin (CRESTOR) 5 MG tablet Take 1 tablet (5 mg total) by mouth 2 (two) times a week. Take one tablet by mouth once a week for a few weeks, increase to twice weekly as tolerated 08/30/18   NDorothy Spark MD  triamcinolone ointment (KENALOG) 0.1 % Apply 1 application topically 2 (two) times daily. 03/26/18   CLiane Comber NP     Family History  Problem Relation Age of Onset  . Heart failure Mother        in her 453s . Hypertension Mother   . Heart attack Mother   . Obesity Mother   . Coronary artery disease Father        developed in his 41s . Hypertension Father   . Heart attack Father   . Heart attack Brother        in there 661s . Heart attack Brother        in there 624s . Gout Sister     Social History   Socioeconomic History  . Marital status: Married    Spouse name: Not on file  . Number of children: Not on file  . Years of education: Not on file  . Highest education level: Not on file  Occupational History  . Occupation: Retired  SScientific laboratory technician . Financial resource strain: Not on file  . Food insecurity:    Worry: Not on file    Inability: Not on file  . Transportation needs:    Medical: Not on file    Non-medical: Not on file  Tobacco Use  . Smoking status: Former  Smoker    Packs/day: 1.00    Years: 20.00    Pack  years: 20.00    Types: Cigarettes    Last attempt to quit: 02/14/1978    Years since quitting: 40.6  . Smokeless tobacco: Never Used  Substance and Sexual Activity  . Alcohol use: Not Currently    Alcohol/week: 0.0 standard drinks    Comment: rare  . Drug use: No  . Sexual activity: Not on file  Lifestyle  . Physical activity:    Days per week: Not on file    Minutes per session: Not on file  . Stress: Not on file  Relationships  . Social connections:    Talks on phone: Not on file    Gets together: Not on file    Attends religious service: Not on file    Active member of club or organization: Not on file    Attends meetings of clubs or organizations: Not on file    Relationship status: Not on file  Other Topics Concern  . Not on file  Social History Narrative  . Not on file      Review of Systems currently denies fever, headache, chest pain, dyspnea, cough, nausea, vomiting or bleeding.  He does have some intermittent right abdominal and low back pain.  Vital Signs: BP (!) 148/84   Pulse 60   Temp 98 F (36.7 C) (Oral)   Resp 18   SpO2 98%   Physical Exam awake, alert.  Chest with distant but clear breath sounds bilaterally.  Heart with regular rate and rhythm.  Abdomen obese, soft, positive bowel sounds, mildly tender right lateral/lower abdominal region.  Trace pretibial edema bilaterally.  Imaging: Ct Chest Wo Contrast  Result Date: 08/29/2018 CLINICAL DATA:  New left upper lobe nodule on recent chest radiographs. EXAM: CT CHEST WITHOUT CONTRAST TECHNIQUE: Multidetector CT imaging of the chest was performed following the standard protocol without IV contrast. COMPARISON:  Chest radiographs dated 08/18/2018 and chest CT dated 06/03/2007. FINDINGS: Cardiovascular: Atheromatous calcifications, including the coronary arteries and aorta. Mediastinum/Nodes: 8 mm left lobe thyroid nodule, not previously included. No enlarged lymph nodes. Lungs/Pleura: Irregular left upper  lobe nodule measuring 2.0 x 1.6 cm on image number 40 series 3, corresponding to the nodule seen on the recent chest radiographs. No other nodules are seen. Bilateral lower lobe linear atelectasis and/or scarring. Minimal bilateral bullous changes with a centrilobular distribution. Mild peribronchial thickening. Upper Abdomen: Left perinephric soft tissue stranding. Only the upper portion of the left kidney is included. Cholecystectomy clips. Multiple additional surgical clips in the left upper abdomen and gastroesophageal junction region. Musculoskeletal: Thoracic spine degenerative changes. Cervicothoracic pedicle screw and rod fixation and old compression fractures. IMPRESSION: 1. 2.0 x 1.6 cm irregular left upper lobe nodule. This is highly suspicious for a primary lung carcinoma. 2. 8 mm left lobe thyroid nodule, too small to characterize, but most likely benign in the absence of known clinical risk factors for thyroid carcinoma. 3. Mild changes of COPD and chronic bronchitis. 4. Dense calcific coronary artery and aortic atherosclerosis. Aortic Atherosclerosis (ICD10-I70.0) and Emphysema (ICD10-J43.9). Electronically Signed   By: Claudie Revering M.D.   On: 08/29/2018 13:12   Nm Pet Image Initial (pi) Skull Base To Thigh  Result Date: 09/04/2018 CLINICAL DATA:  Initial treatment strategy for lung nodule. EXAM: NUCLEAR MEDICINE PET SKULL BASE TO THIGH TECHNIQUE: 10.1 mCi F-18 FDG was injected intravenously. Full-ring PET imaging was performed from the skull base to thigh after the  radiotracer. CT data was obtained and used for attenuation correction and anatomic localization. Fasting blood glucose: 85 mg/dl COMPARISON:  CT chest 08/29/2018 FINDINGS: Mediastinal blood pool activity: SUV max 2.64 NECK: There is a FDG avid nodule in left lobe of thyroid gland measuring 8 mm within SUV max of 6.49. No hypermetabolic lymph nodes identified within the soft tissues of the neck. Incidental CT findings: none CHEST: No  hypermetabolic axillary, supraclavicular, mediastinal, or hilar lymph nodes. Round solid spiculated nodule in the left upper lobe measures 1.7 cm and has an SUV max of 6.53. No additional hypermetabolic lung nodules. Incidental CT findings: Changes of emphysema identified. Aortic atherosclerosis. Calcifications in the LAD, RCA, left circumflex and left main coronary arteries. ABDOMEN/PELVIS: No abnormal radiotracer activity identified within the liver, pancreas, or spleen. Normal appearance of the adrenal glands. Postoperative change from partial left nephrectomy identified. Low level FDG uptake is identified within this area corresponding to soft tissue stranding throughout the perinephric fat. Focal area of increased soft tissue which appears to encase the left renal artery and vein is identified measuring 2.2 cm within SUV max of 2.90. No hypermetabolic lymph nodes within the abdomen or pelvis. Incidental CT findings: none SKELETON: Multifocal hypermetabolic bone lesions are identified. There is a permeative lesion within the left iliac bone measuring 2.8 cm within SUV max of 9.04, image 143/4. Sclerotic lesion within the posterior left iliac bone is new from previous exam measuring 1.3 cm within SUV max of 6.9. There is a large defect within the posterior right iliac bone which may correspond to previous bone graft harvesting site. There is increased uptake within this area within SUV max of 3.90. Focal area of increased uptake localizing to the anterior aspect of the right seventh rib has an SUV max of 6.90. Small focus of increased uptake localizing to the tip of the clivus has an SUV max of 5.77. Incidental CT findings: none IMPRESSION: 1. Left upper lobe pulmonary nodule exhibits moderate increased uptake and is worrisome for primary bronchogenic carcinoma. No hypermetabolic hilar or mediastinal adenopathy. 2. There is a bandlike area of increased soft tissue surrounding the left renal artery and vein. This  is nonspecific and could conceivably represent postoperative change status post partial left nephrectomy. Residual/recurrent renal cell carcinoma not excluded. 3. Hypermetabolic lytic lesion identified involving the left iliac bone is new from 10/11/2017. Worrisome for osseous metastasis. Hypermetabolic sclerotic lesion within the posterior left iliac bone is also new in concerning for metastasis. Increased uptake within the anterior right seventh rib is noted which may represent an area of metastasis or nondisplaced rib fracture. Within the tip of the clivus there is a small focus of increased uptake. Can not rule out metastatic disease. 4. Aortic Atherosclerosis (ICD10-I70.0) and Emphysema (ICD10-J43.9). 5. Multi vessel coronary artery calcifications. Electronically Signed   By: Kerby Moors M.D.   On: 09/04/2018 09:53    Labs:  CBC: Recent Labs    05/24/18 0000 08/28/18 1055 09/11/18 1355 09/20/18 0925  WBC 8.6 6.5 7.3 6.1  HGB 15.8 15.5 14.9 15.4  HCT 45.7 46.1 43.6 44.6  PLT 190 161 154 158    COAGS: Recent Labs    09/20/18 0925  INR 1.1    BMP: Recent Labs    03/26/18 1644 05/24/18 0000 08/28/18 1055 09/11/18 1355  NA 132* 136 132* 131*  K 4.5 4.1 4.3 4.0  CL 97* 99 97* 96*  CO2 28 26 25 23   GLUCOSE 101* 92 100* 102*  BUN 26*  29* 27* 28*  CALCIUM 9.6 9.6 9.6 9.0  CREATININE 1.18 1.06 1.18 1.17  GFRNONAA 60 69 60 >60  GFRAA 70 80 70 >60    LIVER FUNCTION TESTS: Recent Labs    02/21/18 1128 03/26/18 1644 05/24/18 0000 08/28/18 1055 09/11/18 1355  BILITOT 0.8 0.4 0.4 0.5 0.4  AST 28 19 20 23 23   ALT 19 14 16 17 18   ALKPHOS 50  --   --   --  83  PROT 7.3 7.1 7.4 6.8 7.4  ALBUMIN 3.7  --   --   --  3.6    TUMOR MARKERS: No results for input(s): AFPTM, CEA, CA199, CHROMGRNA in the last 8760 hours.  Assessment and Plan: 73 y.o. male with past medical history significant for hypertension, diabetes, dyslipidemia, GERD, depression, coronary artery disease  with prior stenting, on Plavix, asthma/COPD, BPH, left renal cell carcinoma in September 2019 status post wedge resection.  Recent PET scan has revealed:  1. Left upper lobe pulmonary nodule exhibits moderate increased uptake and is worrisome for primary bronchogenic carcinoma. No hypermetabolic hilar or mediastinal adenopathy. 2. There is a bandlike area of increased soft tissue surrounding the left renal artery and vein. This is nonspecific and could conceivably represent postoperative change status post partial left nephrectomy. Residual/recurrent renal cell carcinoma not excluded. 3. Hypermetabolic lytic lesion identified involving the left iliac bone is new from 10/11/2017. Worrisome for osseous metastasis. Hypermetabolic sclerotic lesion within the posterior left iliac bone is also new in concerning for metastasis. Increased uptake within the anterior right seventh rib is noted which may represent an area of metastasis or nondisplaced rib fracture. Within the tip of the clivus there is a small focus of increased uptake. Can not rule out metastatic disease. 4. Aortic Atherosclerosis (ICD10-I70.0) and Emphysema (ICD10-J43.9). 5. Multi vessel coronary artery calcifications.  He presents today for CT-guided left iliac crest lytic lesion biopsy for further evaluation.Risks and benefits of procedure was discussed with the patient and/or patient's family including, but not limited to bleeding, infection, damage to adjacent structures or low yield requiring additional tests.  All of the questions were answered and there is agreement to proceed.  Consent signed and in chart.     Thank you for this interesting consult.  I greatly enjoyed meeting [name] and look forward to participating in their care.  A copy of this report was sent to the requesting provider on this date.  Electronically Signed: D. Rowe Robert, PA-C 09/20/2018, 10:07 AM   I spent a total of 25 minutes in  face to face in clinical consultation, greater than 50% of which was counseling/coordinating care for CT-guided biopsy of left iliac crest lytic lesion

## 2018-09-20 NOTE — Discharge Instructions (Signed)
oModerate Conscious Sedation, Adult, Care After These instructions provide you with information about caring for yourself after your procedure. Your health care provider may also give you more specific instructions. Your treatment has been planned according to current medical practices, but problems sometimes occur. Call your health care provider if you have any problems or questions after your procedure. What can I expect after the procedure? After your procedure, it is common:  To feel sleepy for several hours.  To feel clumsy and have poor balance for several hours.  To have poor judgment for several hours.  To vomit if you eat too soon. Follow these instructions at home: For at least 24 hours after the procedure:   Do not: ? Participate in activities where you could fall or become injured. ? Drive. ? Use heavy machinery. ? Drink alcohol. ? Take sleeping pills or medicines that cause drowsiness. ? Make important decisions or sign legal documents. ? Take care of children on your own.  Rest. Eating and drinking  Follow the diet recommended by your health care provider.  If you vomit: ? Drink water, juice, or soup when you can drink without vomiting. ? Make sure you have little or no nausea before eating solid foods. General instructions  Have a responsible adult stay with you until you are awake and alert.  Take over-the-counter and prescription medicines only as told by your health care provider.  If you smoke, do not smoke without supervision.  Keep all follow-up visits as told by your health care provider. This is important. Contact a health care provider if:  You keep feeling nauseous or you keep vomiting.  You feel light-headed.  You develop a rash.  You have a fever. Get help right away if:  You have trouble breathing. This information is not intended to replace advice given to you by your health care provider. Make sure you discuss any questions you have  with your health care provider. Document Released: 03/20/2013 Document Revised: 11/02/2015 Document Reviewed: 09/19/2015 Elsevier Interactive Patient Education  2019 [employer].

## 2018-09-20 NOTE — Progress Notes (Signed)
Spoke with Sol Passer, pt's wife, and confirmed she is the ride for pt home.  Informed her we would call her when pt returns from procedure with a d/c time home and d/c instructions.  She voiced understanding.

## 2018-09-20 NOTE — Procedures (Signed)
Interventional Radiology Procedure Note  Procedure: CT Guided Biopsy of left iliac bone lesion  Complications: None  Estimated Blood Loss: < 10 mL  Findings: 11 G core biopsy of left iliac bone performed under CT guidance.  Two core samples obtained and sent to Pathology.  Venetia Night. Kathlene Cote, M.D Pager:  [phone]

## 2018-09-25 ENCOUNTER — Encounter: Payer: Self-pay | Admitting: *Deleted

## 2018-09-25 ENCOUNTER — Encounter: Payer: Self-pay | Admitting: Internal Medicine

## 2018-09-25 ENCOUNTER — Inpatient Hospital Stay: Payer: Medicare Other

## 2018-09-25 ENCOUNTER — Inpatient Hospital Stay: Payer: Medicare Other | Attending: Internal Medicine | Admitting: Internal Medicine

## 2018-09-25 ENCOUNTER — Other Ambulatory Visit: Payer: Self-pay

## 2018-09-25 VITALS — BP 132/78 | HR 64 | Temp 97.8°F | Resp 18 | Ht 67.0 in | Wt 199.4 lb

## 2018-09-25 DIAGNOSIS — C3492 Malignant neoplasm of unspecified part of left bronchus or lung: Secondary | ICD-10-CM

## 2018-09-25 DIAGNOSIS — Z7189 Other specified counseling: Secondary | ICD-10-CM | POA: Insufficient documentation

## 2018-09-25 DIAGNOSIS — C642 Malignant neoplasm of left kidney, except renal pelvis: Secondary | ICD-10-CM

## 2018-09-25 DIAGNOSIS — D49512 Neoplasm of unspecified behavior of left kidney: Secondary | ICD-10-CM | POA: Diagnosis not present

## 2018-09-25 DIAGNOSIS — C7951 Secondary malignant neoplasm of bone: Secondary | ICD-10-CM | POA: Diagnosis not present

## 2018-09-25 DIAGNOSIS — C3490 Malignant neoplasm of unspecified part of unspecified bronchus or lung: Secondary | ICD-10-CM

## 2018-09-25 DIAGNOSIS — C3412 Malignant neoplasm of upper lobe, left bronchus or lung: Secondary | ICD-10-CM | POA: Diagnosis not present

## 2018-09-25 DIAGNOSIS — I10 Essential (primary) hypertension: Secondary | ICD-10-CM

## 2018-09-25 DIAGNOSIS — N401 Enlarged prostate with lower urinary tract symptoms: Secondary | ICD-10-CM | POA: Diagnosis not present

## 2018-09-25 DIAGNOSIS — Z85528 Personal history of other malignant neoplasm of kidney: Secondary | ICD-10-CM | POA: Diagnosis not present

## 2018-09-25 NOTE — Progress Notes (Signed)
Oncology Nurse Navigator Documentation  Oncology Nurse Navigator Flowsheets 09/25/2018  Navigator Location [facility]  Navigator Encounter Type Other/per Dr. Julien Nordmann, I requested molecular on recent pathology  Barriers/Navigation Needs Coordination of Care  Interventions Coordination of Care  Coordination of Care Other  Acuity Level 2  Time Spent with Patient 15

## 2018-09-25 NOTE — Progress Notes (Signed)
[place] [phone]   [phone]  OFFICE PROGRESS NOTE  [name], MD [address] [place] [address]  DIAGNOSIS: Stage IV (T1c, N0, M1 C) non-small cell lung cancer, adenocarcinoma presented with left upper lobe lung nodule in addition to metastatic disease to the bone diagnosed in April 2020. 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   PRIOR THERAPY: None  CURRENT THERAPY: None  INTERVAL HISTORY: [name] 73 y.o. male returns to the clinic today for follow-up visit.  His wife was available by phone.  The patient is feeling fine today with no concerning complaints.  He denied having any current chest pain, shortness of breath, cough or hemoptysis.  He denied having any fever or chills.  He has no nausea, vomiting, diarrhea or constipation.  He has no headache or visual changes.  He has no recent weight loss or night sweats.  The patient recently underwent CT-guided core biopsy of the destructive lesion of the left iliac bone by interventional radiology.  The final pathology was consistent with metastatic adenocarcinoma of lung primary.  The patient is here today for evaluation and discussion of his treatment options.  MEDICAL HISTORY: Past Medical History:  Diagnosis Date   Anxiety    treated /w Xanax for mild depression , using 2 times per day, not PRN   Asthma    Benign prostatic hypertrophy    CAD (coronary artery disease)    pt last left heart cath was in Nov 2008. EF was 55% on left ventriculogram. Circumflex was totally occluded after the 1st obtuse marginal with collaterals supplying the distal circumflex. LAD showed luminal irregularities. The stent in LAD was patent. The RCA showed luminal irregularities. The stent in th emid RCA and PDA were patent. There were no inverventions.    Cancer (HCC)    basal cell- face &  head, 1 melanoma revoved from left side of face   Chest pain    from anxiety last time 1 month ago   Chronic obstructive pulmonary disease (HCC)    asthma   Cluster headache    relative to sinus problems none recnt   Constipation    Depression    DM2 (diabetes mellitus, type 2) ([place])    well with diet and exercise controlled   GERD (gastroesophageal reflux disease)    hiatal hernia. Patient did have a Nissen fundoplication rare   Heart murmur    heard 30 yrs ago   History of blood transfusion    need for Bld. transfusion relative to taking NSAIDS   HTN (hypertension)    pt. followed by [facility], last cardiac visit 2012, one yr. ago   Hyperlipidemia    Joint pain    Lactose intolerance    Left kidney mass    Low back pain    chronic   Neuromuscular disorder ([place])    nerve involvement in back & upper back relative to hardware in neck    Obesity    OSA and COPD overlap syndrome ([place]) 02/25/2015   no cpap used pt denies sleep apnea   Osteoarthritis    Peptic ulcer disease    Pneumonia not recent per pt   Sleep concern    states the study (2003 )was done at [facility] it failed & he was told to return & he never did. Pt. states he has been  found by prev. hosp. staff that he has to be told when to breathe & his wife does the same.    Spinal fracture    hx of traumatic in Jun 04, 2007 after falling off the roof   Tinnitus, bilateral    Vitamin D deficiency     ALLERGIES:  is allergic to codeine; morphine and related; nsaids; oxycodone; crestor [rosuvastatin]; cymbalta [duloxetine hcl]; dilaudid [hydromorphone hcl]; effexor [venlafaxine]; gluten meal; topamax [topiramate]; tramadol hcl; trazodone and nefazodone; adhesive [tape]; penicillins; and sulfa antibiotics.  MEDICATIONS:  Current Outpatient Medications  Medication Sig Dispense Refill   acetaminophen (TYLENOL) 500 MG tablet Take 1 tablet (500 mg total) by mouth every 6 (six) hours as  needed. 30 tablet 0   Alpha-Lipoic Acid 200 MG CAPS Take by mouth 2 (two) times daily.     Ascorbic Acid (VITAMIN C) 1000 MG tablet Take 1,000 mg by mouth 2 (two) times daily.     bisoprolol-hydrochlorothiazide (ZIAC) 5-6.25 MG tablet Take 1 tablet by mouth daily. 90 tablet 1   Blood Glucose Monitoring Suppl (FREESTYLE FREEDOM LITE) w/Device KIT Check blood sugar 1 time a day. DX-E11.21 1 each 0   Cholecalciferol (VITAMIN D3) 5000 units CAPS Take 5,000 Units by mouth 2 (two) times daily.     CHOLINE PO Take 600 mg by mouth daily.     clopidogrel (PLAVIX) 75 MG tablet Take 1 tablet (75 mg total) by mouth daily after breakfast. 90 tablet 1   diazepam (VALIUM) 5 MG tablet TAKE 1/2 TO 1 TABLET BY MOUTH TWICE DAILY ONLY IF NEEDED FOR ANXIETY ATTACK AND LIMIT TO 5 DAYS A WEEK TO AVOID ADDICTION 60 tablet 0   DOXYLAMINE SUCCINATE PO Take 1 tablet by mouth at bedtime.     FOLIC ACID PO Take 481 mcg by mouth daily.     ipratropium (ATROVENT) 0.03 % nasal spray Place 1-2 sprays into both nostrils daily as needed (for allergies.). 30 mL 3   Magnesium 500 MG TABS Take 400 mg by mouth at bedtime. Take 49m QID     MELATONIN PO Take 30 mg by mouth at bedtime.     Multiple Vitamins-Minerals (MULTIVITAMIN ADULT PO) Take by mouth daily.     nitroGLYCERIN (NITROSTAT) 0.4 MG SL tablet Place 0.4 mg under the tongue every 5 (five) minutes x 3 doses as needed for chest pain.     rosuvastatin (CRESTOR) 5 MG tablet Take 1 tablet (5 mg total) by mouth 2 (two) times a week. Take one tablet by mouth once a week for a few weeks, increase to twice weekly as tolerated 24 tablet 3   SUPER B COMPLEX/C PO Take by mouth daily.     tamsulosin (FLOMAX) 0.4 MG CAPS capsule Take 1 capsule daily for Prostate 90 capsule 1   tetrahydrozoline-zinc (VISINE-AC) 0.05-0.25 % ophthalmic solution Place 1-2 drops into both eyes 3 (three) times daily as needed (for redness/irritation.).     triamcinolone ointment (KENALOG)  0.1 % Apply 1 application topically 2 (two) times daily. 80 g 1   Turmeric 500 MG TABS Take by mouth. Takes 3 in the morning and 3 in the evening     VALERIAN PO Take 400 mg by mouth daily.     No current facility-administered medications for this visit.     SURGICAL HISTORY:  Past Surgical History:  Procedure Laterality Date   BACK SURGERY     x3- last surgery lumbar- 1998- / fusion    blepheroplasty  both eyesx2   C5-T6 posterior fusion     with [place] and radius screws   [place], 2008 & 2012 multiple stents  total 4 times   cataracts     cataracts removed- /w IOL - both eyes    CERVICAL SPINE SURGERY     COLONOSCOPY     ESOPHAGEAL MANOMETRY     HARDWARE REMOVAL  02/20/2012   Procedure: HARDWARE REMOVAL;  Surgeon: [name], MD;  Location: [facility];  Service: Neurosurgery;  Laterality: N/A;  Hardware Removal   HIATAL HERNIA REPAIR  1979, spring 2009   LAPAROSCOPIC CHOLECYSTECTOMY     & IOC   multiple percutaneous coronary interventions     NASAL SINUS SURGERY     x2, sees Dr. [name], still having problems, states he uses Benadryl PRN- up to 5 times per day    right temporal artery biopsy     right wrist plate insertion  01/[id number]   ROBOTIC ASSITED PARTIAL NEPHRECTOMY Left 03/02/2018   Procedure: XI ROBOTIC ASSITED LEFT PARTIAL NEPHRECTOMY WITH LYSIS OF ADHESIONS X30 MINUTES.;  Surgeon: Ardis Hughs, MD;  Location: [facility];  Service: Urology;  Laterality: Left;  CLAMP TIME FOR KIDNEY 1053-1110 TOTAL 18MINUTES   Rotator cuff surgery Right    SKIN BIOPSY Right 10/31/2017   Chest   TRANSURETHRAL RESECTION OF BLADDER TUMOR N/A 11/03/2017   Procedure: cystoscopy;  Surgeon: Kathie Rhodes, MD;  Location: [facility];  Service: Urology;  Laterality: N/A;   UMBILICAL HERNIA REPAIR     UPPER GI ENDOSCOPY     VENTRAL HERNIA REPAIR N/A 03/02/2018   Procedure: LAPAROSCOPIC REPAIR OF INCARCERATED INCISIONAL HERNIA WITH LYSIS OF ADHESIONS;   Surgeon: Michael Boston, MD;  Location: [facility];  Service: General;  Laterality: N/A;    REVIEW OF SYSTEMS:  Constitutional: negative Eyes: negative Ears, nose, mouth, throat, and face: negative Respiratory: negative Cardiovascular: negative Gastrointestinal: negative Genitourinary:negative Integument/breast: negative Hematologic/lymphatic: negative Musculoskeletal:negative Neurological: negative Behavioral/Psych: negative Endocrine: negative Allergic/Immunologic: negative   PHYSICAL EXAMINATION: General appearance: alert, cooperative and no distress Head: Normocephalic, without obvious abnormality, atraumatic Neck: no adenopathy, no JVD, supple, symmetrical, trachea midline and thyroid not enlarged, symmetric, no tenderness/mass/nodules Lymph nodes: Cervical, supraclavicular, and axillary nodes normal. Resp: clear to auscultation bilaterally Back: symmetric, no curvature. ROM normal. No CVA tenderness. Cardio: regular rate and rhythm, S1, S2 normal, no murmur, click, rub or gallop GI: soft, non-tender; bowel sounds normal; no masses,  no organomegaly Extremities: extremities normal, atraumatic, no cyanosis or edema Neurologic: Alert and oriented X 3, normal strength and tone. Normal symmetric reflexes. Normal coordination and gait  ECOG PERFORMANCE STATUS: 1 - Symptomatic but completely ambulatory  Blood pressure 132/78, pulse 64, temperature 97.8 F (36.6 C), temperature source Oral, resp. rate 18, height 5' 7"  (1.702 m), weight 199 lb 6.4 oz (90.4 kg), SpO2 98 %.  LABORATORY DATA: Lab Results  Component Value Date   WBC 6.1 09/20/2018   HGB 15.4 09/20/2018   HCT 44.6 09/20/2018   MCV 92.1 09/20/2018   PLT 158 09/20/2018      Chemistry      Component Value Date/Time   NA 131 (L) 09/11/2018 1355   K 4.0 09/11/2018 1355   CL 96 (L) 09/11/2018 1355   CO2 23 09/11/2018 1355   BUN 28 (H) 09/11/2018 1355   CREATININE 1.17 09/11/2018 1355   CREATININE 1.18 08/28/2018  1055      Component Value Date/Time   CALCIUM  9.0 09/11/2018 1355   ALKPHOS 83 09/11/2018 1355   AST 23 09/11/2018 1355   ALT 18 09/11/2018 1355   BILITOT 0.4 09/11/2018 1355       RADIOGRAPHIC STUDIES: Ct Chest Wo Contrast  Result Date: 08/29/2018 CLINICAL DATA:  New left upper lobe nodule on recent chest radiographs. EXAM: CT CHEST WITHOUT CONTRAST TECHNIQUE: Multidetector CT imaging of the chest was performed following the standard protocol without IV contrast. COMPARISON:  Chest radiographs dated 08/18/2018 and chest CT dated 06/03/2007. FINDINGS: Cardiovascular: Atheromatous calcifications, including the coronary arteries and aorta. Mediastinum/Nodes: 8 mm left lobe thyroid nodule, not previously included. No enlarged lymph nodes. Lungs/Pleura: Irregular left upper lobe nodule measuring 2.0 x 1.6 cm on image number 40 series 3, corresponding to the nodule seen on the recent chest radiographs. No other nodules are seen. Bilateral lower lobe linear atelectasis and/or scarring. Minimal bilateral bullous changes with a centrilobular distribution. Mild peribronchial thickening. Upper Abdomen: Left perinephric soft tissue stranding. Only the upper portion of the left kidney is included. Cholecystectomy clips. Multiple additional surgical clips in the left upper abdomen and gastroesophageal junction region. Musculoskeletal: Thoracic spine degenerative changes. Cervicothoracic pedicle screw and rod fixation and old compression fractures. IMPRESSION: 1. 2.0 x 1.6 cm irregular left upper lobe nodule. This is highly suspicious for a primary lung carcinoma. 2. 8 mm left lobe thyroid nodule, too small to characterize, but most likely benign in the absence of known clinical risk factors for thyroid carcinoma. 3. Mild changes of COPD and chronic bronchitis. 4. Dense calcific coronary artery and aortic atherosclerosis. Aortic Atherosclerosis (ICD10-I70.0) and Emphysema (ICD10-J43.9). Electronically Signed   By:  Claudie Revering M.D.   On: 08/29/2018 13:12   Nm Pet Image Initial (pi) Skull Base To Thigh  Result Date: 09/04/2018 CLINICAL DATA:  Initial treatment strategy for lung nodule. EXAM: NUCLEAR MEDICINE PET SKULL BASE TO THIGH TECHNIQUE: 10.1 mCi F-18 FDG was injected intravenously. Full-ring PET imaging was performed from the skull base to thigh after the radiotracer. CT data was obtained and used for attenuation correction and anatomic localization. Fasting blood glucose: 85 mg/dl COMPARISON:  CT chest 08/29/2018 FINDINGS: Mediastinal blood pool activity: SUV max 2.64 NECK: There is a FDG avid nodule in left lobe of thyroid gland measuring 8 mm within SUV max of 6.49. No hypermetabolic lymph nodes identified within the soft tissues of the neck. Incidental CT findings: none CHEST: No hypermetabolic axillary, supraclavicular, mediastinal, or hilar lymph nodes. Round solid spiculated nodule in the left upper lobe measures 1.7 cm and has an SUV max of 6.53. No additional hypermetabolic lung nodules. Incidental CT findings: Changes of emphysema identified. Aortic atherosclerosis. Calcifications in the LAD, RCA, left circumflex and left main coronary arteries. ABDOMEN/PELVIS: No abnormal radiotracer activity identified within the liver, pancreas, or spleen. Normal appearance of the adrenal glands. Postoperative change from partial left nephrectomy identified. Low level FDG uptake is identified within this area corresponding to soft tissue stranding throughout the perinephric fat. Focal area of increased soft tissue which appears to encase the left renal artery and vein is identified measuring 2.2 cm within SUV max of 2.90. No hypermetabolic lymph nodes within the abdomen or pelvis. Incidental CT findings: none SKELETON: Multifocal hypermetabolic bone lesions are identified. There is a permeative lesion within the left iliac bone measuring 2.8 cm within SUV max of 9.04, image 143/4. Sclerotic lesion within the posterior  left iliac bone is new from previous exam measuring 1.3 cm within SUV max of 6.9. There is  a large defect within the posterior right iliac bone which may correspond to previous bone graft harvesting site. There is increased uptake within this area within SUV max of 3.90. Focal area of increased uptake localizing to the anterior aspect of the right seventh rib has an SUV max of 6.90. Small focus of increased uptake localizing to the tip of the clivus has an SUV max of 5.77. Incidental CT findings: none IMPRESSION: 1. Left upper lobe pulmonary nodule exhibits moderate increased uptake and is worrisome for primary bronchogenic carcinoma. No hypermetabolic hilar or mediastinal adenopathy. 2. There is a bandlike area of increased soft tissue surrounding the left renal artery and vein. This is nonspecific and could conceivably represent postoperative change status post partial left nephrectomy. Residual/recurrent renal cell carcinoma not excluded. 3. Hypermetabolic lytic lesion identified involving the left iliac bone is new from 10/11/2017. Worrisome for osseous metastasis. Hypermetabolic sclerotic lesion within the posterior left iliac bone is also new in concerning for metastasis. Increased uptake within the anterior right seventh rib is noted which may represent an area of metastasis or nondisplaced rib fracture. Within the tip of the clivus there is a small focus of increased uptake. Can not rule out metastatic disease. 4. Aortic Atherosclerosis (ICD10-I70.0) and Emphysema (ICD10-J43.9). 5. Multi vessel coronary artery calcifications. Electronically Signed   By: Kerby Moors M.D.   On: 09/04/2018 09:53   Ct Biopsy  Result Date: 09/20/2018 CLINICAL DATA:  Left upper lobe lung mass and hypermetabolic lytic lesion of the left iliac bone. EXAM: CT GUIDED CORE BIOPSY OF LEFT ILIAC BONE LESION ANESTHESIA/SEDATION: 3.0 mg IV Versed; 100 mcg IV Fentanyl Total Moderate Sedation Time:  25 minutes. The patient's level of  consciousness and physiologic status were continuously monitored during the procedure by Radiology nursing. PROCEDURE: The procedure risks, benefits, and alternatives were explained to the patient. Questions regarding the procedure were encouraged and answered. The patient understands and consents to the procedure. A time-out was performed prior to initiating the procedure. The left gluteal region was prepped with chlorhexidine in a sterile fashion, and a sterile drape was applied covering the operative field. A sterile gown and sterile gloves were used for the procedure. Local anesthesia was provided with 1% Lidocaine. CT was performed in a prone position. Initially, a 36 gauge needle was advanced to the level of a left iliac bone lesion. A single coaxial 18 gauge core biopsy sample was obtained with tissue submitted in formalin. An 11 gauge bone biopsy needle was then utilized in obtaining 2 additional core biopsy samples at the level of the bone lesion. COMPLICATIONS: None FINDINGS: Imaging demonstrates a permeative destructive lesion involving the upper medial aspect of the posterior left iliac bone measuring just over 2 cm in estimated maximal transverse diameter. Initial 18 gauge sampling yielded only a small amount of tissue. Therefore, a larger bone cutting needle system was utilized and 11 gauge samples obtained. This yielded two intact tissue specimens. IMPRESSION: CT-guided core biopsy performed of destructive lesion of the left iliac bone. Electronically Signed   By: Aletta Edouard M.D.   On: 09/20/2018 13:06    ASSESSMENT AND PLAN: This is a very pleasant 73 years old white male recently diagnosed with metastatic non-small cell lung cancer, adenocarcinoma presented with left upper lobe lung nodule in addition to metastatic bone disease diagnosed in April 2020. The patient also has a history of renal cell carcinoma status post wedge resection of the left kidney in September 2019. I had a lengthy  discussion  with the patient and his wife today about his current condition and treatment options. I recommended for the patient to complete the staging work-up by ordering MRI of the brain to rule out brain metastasis. I will also refer the patient to radiation oncology for consideration of palliative radiotherapy to the metastatic disease in the left iliac area. I will request his tissue block to be [facility] to [facility] for molecular studies and PDL 1.  In the meantime I will also send blood sample to [facility] for molecular studies and if we receive the positive results sooner we will cancel the [facility] test. Patient will come back for follow-up visit in 2 weeks for evaluation and repeat blood work as well as discussion of his treatment options based on the molecular studies and the staging work-up. He was advised to call immediately if he has any concerning symptoms in the interval. The patient voices understanding of current disease status and treatment options and is in agreement with the current care plan.  All questions were answered. The patient knows to call the clinic with any problems, questions or concerns. We can certainly see the patient much sooner if necessary.  I spent 15 minutes counseling the patient face to face. The total time spent in the appointment was 25 minutes.  Disclaimer: This note was dictated with voice recognition software. Similar sounding words can inadvertently be transcribed and may not be corrected upon review.

## 2018-09-28 ENCOUNTER — Telehealth: Payer: Self-pay | Admitting: Internal Medicine

## 2018-09-28 NOTE — Telephone Encounter (Signed)
Called regarding 4/28

## 2018-10-02 ENCOUNTER — Encounter (HOSPITAL_COMMUNITY): Payer: Self-pay | Admitting: Internal Medicine

## 2018-10-03 ENCOUNTER — Ambulatory Visit
Admission: RE | Admit: 2018-10-03 | Discharge: 2018-10-03 | Disposition: A | Discharge status: Home / Self Care | Payer: Medicare Other | Source: Ambulatory Visit | Attending: Radiation Oncology | Admitting: Radiation Oncology

## 2018-10-03 ENCOUNTER — Other Ambulatory Visit: Payer: Self-pay

## 2018-10-03 ENCOUNTER — Encounter: Payer: Self-pay | Admitting: Radiation Oncology

## 2018-10-03 VITALS — BP 156/84 | HR 65 | Temp 97.8°F | Resp 20 | Ht 67.0 in | Wt 205.0 lb

## 2018-10-03 DIAGNOSIS — I10 Essential (primary) hypertension: Secondary | ICD-10-CM | POA: Insufficient documentation

## 2018-10-03 DIAGNOSIS — Z87891 Personal history of nicotine dependence: Secondary | ICD-10-CM | POA: Insufficient documentation

## 2018-10-03 DIAGNOSIS — C7951 Secondary malignant neoplasm of bone: Secondary | ICD-10-CM

## 2018-10-03 DIAGNOSIS — G4733 Obstructive sleep apnea (adult) (pediatric): Secondary | ICD-10-CM | POA: Diagnosis not present

## 2018-10-03 DIAGNOSIS — I700 Atherosclerosis of aorta: Secondary | ICD-10-CM | POA: Diagnosis not present

## 2018-10-03 DIAGNOSIS — N400 Enlarged prostate without lower urinary tract symptoms: Secondary | ICD-10-CM | POA: Insufficient documentation

## 2018-10-03 DIAGNOSIS — C7802 Secondary malignant neoplasm of left lung: Secondary | ICD-10-CM | POA: Diagnosis not present

## 2018-10-03 DIAGNOSIS — Z9889 Other specified postprocedural states: Secondary | ICD-10-CM | POA: Diagnosis not present

## 2018-10-03 DIAGNOSIS — J449 Chronic obstructive pulmonary disease, unspecified: Secondary | ICD-10-CM | POA: Diagnosis not present

## 2018-10-03 DIAGNOSIS — F329 Major depressive disorder, single episode, unspecified: Secondary | ICD-10-CM | POA: Insufficient documentation

## 2018-10-03 DIAGNOSIS — M2550 Pain in unspecified joint: Secondary | ICD-10-CM | POA: Insufficient documentation

## 2018-10-03 DIAGNOSIS — E119 Type 2 diabetes mellitus without complications: Secondary | ICD-10-CM | POA: Insufficient documentation

## 2018-10-03 DIAGNOSIS — M545 Low back pain: Secondary | ICD-10-CM | POA: Diagnosis not present

## 2018-10-03 DIAGNOSIS — I2510 Atherosclerotic heart disease of native coronary artery without angina pectoris: Secondary | ICD-10-CM | POA: Insufficient documentation

## 2018-10-03 DIAGNOSIS — E041 Nontoxic single thyroid nodule: Secondary | ICD-10-CM | POA: Insufficient documentation

## 2018-10-03 DIAGNOSIS — R011 Cardiac murmur, unspecified: Secondary | ICD-10-CM | POA: Insufficient documentation

## 2018-10-03 DIAGNOSIS — Z85528 Personal history of other malignant neoplasm of kidney: Secondary | ICD-10-CM | POA: Insufficient documentation

## 2018-10-03 DIAGNOSIS — R079 Chest pain, unspecified: Secondary | ICD-10-CM | POA: Insufficient documentation

## 2018-10-03 DIAGNOSIS — C3412 Malignant neoplasm of upper lobe, left bronchus or lung: Secondary | ICD-10-CM | POA: Diagnosis not present

## 2018-10-03 DIAGNOSIS — E559 Vitamin D deficiency, unspecified: Secondary | ICD-10-CM | POA: Diagnosis not present

## 2018-10-03 DIAGNOSIS — C7952 Secondary malignant neoplasm of bone marrow: Secondary | ICD-10-CM

## 2018-10-03 DIAGNOSIS — C3492 Malignant neoplasm of unspecified part of left bronchus or lung: Secondary | ICD-10-CM

## 2018-10-03 DIAGNOSIS — Z79899 Other long term (current) drug therapy: Secondary | ICD-10-CM | POA: Diagnosis not present

## 2018-10-03 DIAGNOSIS — K219 Gastro-esophageal reflux disease without esophagitis: Secondary | ICD-10-CM | POA: Insufficient documentation

## 2018-10-03 DIAGNOSIS — E785 Hyperlipidemia, unspecified: Secondary | ICD-10-CM | POA: Insufficient documentation

## 2018-10-03 HISTORY — DX: Malignant neoplasm of unspecified part of unspecified bronchus or lung: C34.90

## 2018-10-03 HISTORY — DX: Malignant neoplasm of unspecified kidney, except renal pelvis: C64.9

## 2018-10-03 HISTORY — DX: Unspecified malignant neoplasm of skin, unspecified: C44.90

## 2018-10-03 NOTE — Progress Notes (Signed)
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  Sites of Visceral and Bony Metastatic Disease: Per PET 09/04/18:  IMPRESSION: 1. Left upper lobe pulmonary nodule exhibits moderate increased uptake and is worrisome for primary bronchogenic carcinoma. No hypermetabolic hilar or mediastinal adenopathy. 2. There is a bandlike area of increased soft tissue surrounding the left renal artery and vein. This is nonspecific and could conceivably represent postoperative change status post partial left nephrectomy. Residual/recurrent renal cell carcinoma not excluded. 3. Hypermetabolic lytic lesion identified involving the left iliac bone is new from 10/11/2017. Worrisome for osseous metastasis. Hypermetabolic sclerotic lesion within the posterior left iliac bone is also new in concerning for metastasis. Increased uptake within the anterior right seventh rib is noted which may represent an area of metastasis or nondisplaced rib fracture. Within the tip of the clivus there is a small focus of increased uptake. Can not rule out metastatic disease.  Location(s) of Symptomatic Metastases: Hypermetabolic lytic lesion identified involving the left iliac bone is new from 10/11/2017. Worrisome for osseous metastasis. Hypermetabolic sclerotic lesion within the posterior left iliac bone is also new in concerning for metastasis.  Past/Anticipated chemotherapy by medical oncology, if any: Per Dr. Julien Nordmann 09/25/18:  ASSESSMENT AND PLAN: This is a very pleasant 73 years old white male recently diagnosed with metastatic non-small cell lung cancer, adenocarcinoma presented with left upper lobe lung nodule in addition to metastatic bone disease diagnosed in April 2020. The patient also has a history of renal cell carcinoma status post  wedge resection of the left kidney in September 2019. I had a lengthy discussion with the patient and his wife today about his current condition and treatment options. I recommended for the patient to complete the staging work-up by ordering MRI of the brain to rule out brain metastasis. I will also refer the patient to radiation oncology for consideration of palliative radiotherapy to the metastatic disease in the left iliac area. I will request his tissue block to be [facility] for molecular studies and PDL 1.  In the meantime I will also send blood sample to [facility] for molecular studies and if we receive the positive results sooner we will cancel the [facility] test. Patient will come back for follow-up visit in 2 weeks for evaluation and repeat blood work as well as discussion of his treatment options based on the molecular studies and the staging work-up. He was advised to call immediately if he has any concerning symptoms in the interval. The patient voices understanding of current disease status and treatment options and is in agreement with the current care plan.  Pain on a scale of 0-10 is: Pt denies c/o pain   Ambulatory status? Walker? Wheelchair?: ambulatory without assistive device. Steady gait.   SAFETY ISSUES:  Prior radiation? No  Pacemaker/ICD? No  Possible current pregnancy? N/A  Is the patient on methotrexate? No  Current Complaints / other details:  Pt presents today for initial consult with Dr. Sondra Come for Radiation Oncology. Pt is unaccompanied.   BP (!) 156/84 (BP Location: Left Arm, Patient Position: Sitting)   Pulse 65   Temp 97.8 F (36.6 C) (Oral)   Resp 20   Ht 5\' 7"  (1.702 m)   Wt 205 lb (93 kg)   SpO2 99%   BMI 32.11 kg/m   Wt  Readings from Last 3 Encounters:  10/03/18 205 lb (93 kg)  09/25/18 199 lb 6.4 oz (90.4 kg)  09/11/18 203 lb 3.2 oz (92.2 kg)   Loma Sousa, RN BSN
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
## 2018-10-03 NOTE — Patient Instructions (Signed)
Coronavirus (COVID-19) Are you at risk?  Are you at risk for the Coronavirus (COVID-19)?  To be considered HIGH RISK for Coronavirus (COVID-19), you have to meet the following criteria:  . Traveled to [place], [place], [name], [place] or [place]; or in the [place] to [place], [place], [place], or [place]; and have fever, cough, and shortness of breath within the last 2 weeks of travel OR . Been in close contact with a person diagnosed with COVID-19 within the last 2 weeks and have fever, cough, and shortness of breath . IF YOU DO NOT MEET THESE CRITERIA, YOU ARE CONSIDERED LOW RISK FOR COVID-19.  What to do if you are HIGH RISK for COVID-19?  . If you are having a medical emergency, call 911. . Seek medical care right away. Before you go to a doctor's office, urgent care or emergency department, call ahead and tell them about your recent travel, contact with someone diagnosed with COVID-19, and your symptoms. You should receive instructions from your physician's office regarding next steps of care.  . When you arrive at healthcare provider, tell the healthcare staff immediately you have returned from visiting [place], [place], [place], [place] or [name]; or traveled in the [place] to [place], [place], [place], or [place]; in the last two weeks or you have been in close contact with a person diagnosed with COVID-19 in the last 2 weeks.   . Tell the health care staff about your symptoms: fever, cough and shortness of breath. . After you have been seen by a medical provider, you will be either: o Tested for (COVID-19) and discharged home on quarantine except to seek medical care if symptoms worsen, and asked to  - Stay home and avoid contact with others until you get your results (4-5 days)  - Avoid travel on public transportation if possible (such as bus, train, or airplane) or o Sent to the Emergency Department by EMS for evaluation, COVID-19 testing, and possible  admission depending on your condition and test results.  What to do if you are LOW RISK for COVID-19?  Reduce your risk of any infection by using the same precautions used for avoiding the common cold or flu:  . Wash your hands often with soap and warm water for at least 20 seconds.  If soap and water are not readily available, use an alcohol-based hand sanitizer with at least 60% alcohol.  . If coughing or sneezing, cover your mouth and nose by coughing or sneezing into the elbow areas of your shirt or coat, into a tissue or into your sleeve (not your hands). . Avoid shaking hands with others and consider head nods or verbal greetings only. . Avoid touching your eyes, nose, or mouth with unwashed hands.  . Avoid close contact with people who are sick. . Avoid places or events with large numbers of people in one location, like concerts or sporting events. . Carefully consider travel plans you have or are making. . If you are planning any travel outside or inside the [place], visit the CDC's Travelers' Health webpage for the latest health notices. . If you have some symptoms but not all symptoms, continue to monitor at home and seek medical attention if your symptoms worsen. . If you are having a medical emergency, call 911.   ADDITIONAL HEALTHCARE OPTIONS FOR PATIENTS  [facility] / e-Visit: [email or web address]         [facility]: [phone]  [facility]: [phone]                   [facility]: [phone]   

## 2018-10-04 ENCOUNTER — Ambulatory Visit: Admission: RE | Admit: 2018-10-04 | Payer: Medicare Other | Source: Ambulatory Visit | Admitting: Radiation Oncology

## 2018-10-04 ENCOUNTER — Ambulatory Visit
Admission: RE | Admit: 2018-10-04 | Discharge: 2018-10-04 | Disposition: A | Discharge status: Home / Self Care | Payer: Medicare Other | Source: Ambulatory Visit | Attending: Internal Medicine | Admitting: Internal Medicine

## 2018-10-04 DIAGNOSIS — C3490 Malignant neoplasm of unspecified part of unspecified bronchus or lung: Secondary | ICD-10-CM

## 2018-10-04 MED ORDER — GADOBENATE DIMEGLUMINE 529 MG/ML IV SOLN
20.0000 mL | Freq: Once | INTRAVENOUS | Status: AC | PRN
  Administered 2018-10-04: 20 mL via INTRAVENOUS

## 2018-10-08 ENCOUNTER — Other Ambulatory Visit: Payer: Self-pay | Admitting: Medical Oncology

## 2018-10-08 ENCOUNTER — Encounter: Payer: Self-pay | Admitting: *Deleted

## 2018-10-08 DIAGNOSIS — C3492 Malignant neoplasm of unspecified part of left bronchus or lung: Secondary | ICD-10-CM

## 2018-10-08 NOTE — Progress Notes (Signed)
Oncology Nurse Navigator Documentation  Oncology Nurse Navigator Flowsheets 10/08/2018  Navigator Location [facility]  Navigator Encounter Type Other/Dr. Julien Nordmann updated on PDL 1 results.   Barriers/Navigation Needs Coordination of Care  Interventions Coordination of Care  Coordination of Care Other  Acuity Level 2  Time Spent with Patient 30

## 2018-10-08 NOTE — Progress Notes (Signed)
Per Dr. Julien Nordmann, I called to order foundation one testing. I was updated there in not enough DNA for testing.  Dr. Julien Nordmann updated and testing is not ordered at this time.

## 2018-10-09 ENCOUNTER — Other Ambulatory Visit: Payer: Self-pay

## 2018-10-09 ENCOUNTER — Inpatient Hospital Stay: Payer: Medicare Other

## 2018-10-09 ENCOUNTER — Encounter: Payer: Self-pay | Admitting: Internal Medicine

## 2018-10-09 ENCOUNTER — Inpatient Hospital Stay (HOSPITAL_BASED_OUTPATIENT_CLINIC_OR_DEPARTMENT_OTHER): Payer: Medicare Other | Admitting: Internal Medicine

## 2018-10-09 VITALS — BP 137/73 | HR 66 | Temp 97.6°F | Resp 18 | Ht 67.0 in | Wt 201.6 lb

## 2018-10-09 DIAGNOSIS — Z5112 Encounter for antineoplastic immunotherapy: Secondary | ICD-10-CM

## 2018-10-09 DIAGNOSIS — Z85528 Personal history of other malignant neoplasm of kidney: Secondary | ICD-10-CM

## 2018-10-09 DIAGNOSIS — C3492 Malignant neoplasm of unspecified part of left bronchus or lung: Secondary | ICD-10-CM

## 2018-10-09 DIAGNOSIS — R5382 Chronic fatigue, unspecified: Secondary | ICD-10-CM

## 2018-10-09 DIAGNOSIS — C3412 Malignant neoplasm of upper lobe, left bronchus or lung: Secondary | ICD-10-CM

## 2018-10-09 DIAGNOSIS — C7951 Secondary malignant neoplasm of bone: Secondary | ICD-10-CM

## 2018-10-09 DIAGNOSIS — Z5111 Encounter for antineoplastic chemotherapy: Secondary | ICD-10-CM | POA: Insufficient documentation

## 2018-10-09 LAB — CBC WITH DIFFERENTIAL (CANCER CENTER ONLY)
Abs Immature Granulocytes: 0.03 10*3/uL (ref 0.00–0.07)
Basophils Absolute: 0 10*3/uL (ref 0.0–0.1)
Basophils Relative: 1 %
Eosinophils Absolute: 0.2 10*3/uL (ref 0.0–0.5)
Eosinophils Relative: 3 %
HCT: 44.2 % (ref 39.0–52.0)
Hemoglobin: 15.4 g/dL (ref 13.0–17.0)
Immature Granulocytes: 1 %
Lymphocytes Relative: 28 %
Lymphs Abs: 1.6 10*3/uL (ref 0.7–4.0)
MCH: 31 pg (ref 26.0–34.0)
MCHC: 34.8 g/dL (ref 30.0–36.0)
MCV: 88.9 fL (ref 80.0–100.0)
Monocytes Absolute: 0.5 10*3/uL (ref 0.1–1.0)
Monocytes Relative: 9 %
Neutro Abs: 3.5 10*3/uL (ref 1.7–7.7)
Neutrophils Relative %: 58 %
Platelet Count: 166 10*3/uL (ref 150–400)
RBC: 4.97 MIL/uL (ref 4.22–5.81)
RDW: 13.7 % (ref 11.5–15.5)
WBC Count: 5.8 10*3/uL (ref 4.0–10.5)
nRBC: 0 % (ref 0.0–0.2)

## 2018-10-09 LAB — CMP (CANCER CENTER ONLY)
ALT: 19 U/L (ref 0–44)
AST: 26 U/L (ref 15–41)
Albumin: 3.6 g/dL (ref 3.5–5.0)
Alkaline Phosphatase: 82 U/L (ref 38–126)
Anion gap: 10 (ref 5–15)
BUN: 21 mg/dL (ref 8–23)
CO2: 24 mmol/L (ref 22–32)
Calcium: 9.1 mg/dL (ref 8.9–10.3)
Chloride: 97 mmol/L — ABNORMAL LOW (ref 98–111)
Creatinine: 1.11 mg/dL (ref 0.61–1.24)
GFR, Est AFR Am: >60 mL/min (ref >60)
GFR, Estimated: >60 mL/min (ref >60)
Glucose, Bld: 102 mg/dL — ABNORMAL HIGH (ref 70–99)
Potassium: 4.5 mmol/L (ref 3.5–5.1)
Sodium: 131 mmol/L — ABNORMAL LOW (ref 135–145)
Total Bilirubin: 0.4 mg/dL (ref 0.3–1.2)
Total Protein: 7.7 g/dL (ref 6.5–8.1)

## 2018-10-09 MED ORDER — FOLIC ACID 1 MG PO TABS: 1.0000 mg | ORAL_TABLET | Freq: Every day | ORAL | 4 refills | Status: AC

## 2018-10-09 MED ORDER — CYANOCOBALAMIN 1000 MCG/ML IJ SOLN
INTRAMUSCULAR | Status: AC
  Filled 2018-10-09: qty 1

## 2018-10-09 MED ORDER — CYANOCOBALAMIN 1000 MCG/ML IJ SOLN
1000.0000 ug | Freq: Once | INTRAMUSCULAR | Status: AC
  Administered 2018-10-09: 1000 ug via INTRAMUSCULAR

## 2018-10-09 MED ORDER — PROCHLORPERAZINE MALEATE 10 MG PO TABS: 10.0000 mg | ORAL_TABLET | Freq: Four times a day (QID) | ORAL | 0 refills | Status: AC | PRN

## 2018-10-09 NOTE — Progress Notes (Signed)
START ON PATHWAY REGIMEN - Non-Small Cell Lung     A cycle is every 21 days:     Pembrolizumab      Pemetrexed      Carboplatin   **Always confirm dose/schedule in your pharmacy ordering system**  Patient Characteristics: Stage IV Metastatic, Nonsquamous, Initial Chemotherapy/Immunotherapy, PS = 0, 1, ALK Rearrangement Negative and EGFR Mutation Negative/Non-Sensitizing, PD-L1 Expression Positive 1-49% (TPS) / Negative / Not Tested / Awaiting Test Results and  Immunotherapy Candidate AJCC T Category: T1c Current Disease Status: Distant Metastases AJCC N Category: N0 AJCC M Category: M1c AJCC 8 Stage Grouping: IVB Histology: Nonsquamous Cell ROS1 Rearrangement Status: Negative T790M Mutation Status: Not Applicable - EGFR Mutation Negative/Unknown Other Mutations/Biomarkers: No Other Actionable Mutations NTRK Gene Fusion Status: Negative PD-L1 Expression Status: PD-L1 Negative Chemotherapy/Immunotherapy LOT: Initial Chemotherapy/Immunotherapy Molecular Targeted Therapy: Not Appropriate ALK Rearrangement Status: Negative EGFR Mutation Status: Negative/Wild Type BRAF V600E Mutation Status: Negative ECOG Performance Status: 1 Immunotherapy Candidate Status: Candidate for Immunotherapy Intent of Therapy: Non-Curative / Palliative Intent, Discussed with Patient

## 2018-10-09 NOTE — Progress Notes (Signed)
[facility] Telephone:([phone]) [phone]   [phone]  OFFICE PROGRESS NOTE  [name], MD [address] [place] [address]  DIAGNOSIS: Stage IV (T1c, N0, M1 C) non-small cell lung cancer, adenocarcinoma presented with left upper lobe lung nodule in addition to metastatic disease to the bone diagnosed in April 2020.  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   Molecular studies by [id number]  STK11Splice Site SNV 2.1% Everolimus,Temsirolimus  [id number] 0.8% None (VUS), No (VUS)  NTRK3 L629L 1.5%  (Synonymous) No (Synonymous)  KRAS G12D 1.1% Binimetinib  CDKN2A H83Y 1.6% Abemaciclib, Palbociclib, Ribociclib  PDL1 TPS was 0%  PRIOR THERAPY: None  CURRENT THERAPY: Systemic chemotherapy with carboplatin for AUC of 5, Alimta 500 mg/M2 and Keytruda 200 mg IV every 3 weeks.  First dose Oct 16, 2018.  INTERVAL HISTORY: [name] returns to the clinic today for follow-up visit.  The patient is feeling fine today with no concerning complaints.  He denied having any chest pain, shortness of breath, cough or hemoptysis.  He denied having any fever or chills.  He has no nausea, vomiting, diarrhea or constipation.  He has no headache or visual changes.  The patient denied having any recent weight loss or night sweats.  He had molecular studies done by [employer] that showed no actionable mutations.  His PDL 1 expression was negative.  The patient is here today for evaluation and discussion of his treatment options.  MEDICAL HISTORY: Past Medical History:  Diagnosis Date   Anxiety    treated /w Xanax for mild depression , using 2 times per day, not PRN   Asthma    Benign prostatic hypertrophy    CAD (coronary artery disease)    pt last left heart cath was in Nov 2008. EF was 55% on left ventriculogram. Circumflex was totally  occluded after the 1st obtuse marginal with collaterals supplying the distal circumflex. LAD showed luminal irregularities. The stent in LAD was patent. The RCA showed luminal irregularities. The stent in th emid RCA and PDA were patent. There were no inverventions.    Chest pain    from anxiety last time 1 month ago   Chromophobe renal cell carcinoma ([place] 02/2018   s/p left kidney wedge resection   Chronic obstructive pulmonary disease (HCC)    asthma   Cluster headache    relative to sinus problems none recnt   Constipation    Depression    DM2 (diabetes mellitus, type 2) ([facility]    well with diet and exercise controlled   GERD (gastroesophageal reflux disease)    hiatal hernia. Patient did have a Nissen fundoplication rare   Heart murmur    heard 30 yrs ago   History of blood transfusion    need for Bld. transfusion relative to taking NSAIDS   HTN (hypertension)    pt. followed by [facility], last cardiac visit 2012, one yr. 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 Pt. states he has been found by prev. hosp. staff that he has to be told when to breathe & his wife does the same.    Spinal fracture    hx of traumatic in Jun 04, 2007 after falling off the roof   Tinnitus, bilateral    Vitamin D deficiency      ALLERGIES:  is allergic to codeine; morphine and related; nsaids; oxycodone; crestor [rosuvastatin]; cymbalta [duloxetine hcl]; dilaudid [hydromorphone hcl]; effexor [venlafaxine]; gluten meal; topamax [topiramate]; tramadol hcl; trazodone and nefazodone; adhesive [tape]; penicillins; and sulfa antibiotics.  MEDICATIONS:  Current Outpatient Medications  Medication Sig Dispense Refill   acetaminophen (TYLENOL) 500 MG tablet Take 1 tablet (500 mg total) by mouth every 6 (six) hours as needed. 30 tablet 0   Alpha-Lipoic Acid 200 MG CAPS Take by mouth 2 (two) times daily.     Ascorbic Acid (VITAMIN C) 1000 MG tablet Take 1,000 mg by mouth 2 (two) times daily.     bisoprolol-hydrochlorothiazide (ZIAC) 5-6.25 MG tablet Take 1 tablet by mouth daily. 90 tablet 1   Blood Glucose Monitoring Suppl (FREESTYLE FREEDOM LITE) w/Device KIT Check blood sugar 1 time a day. DX-E11.21 1 each 0   Cholecalciferol (VITAMIN D3) 5000 units CAPS Take 5,000 Units by mouth 2 (two) times daily.     CHOLINE PO Take 600 mg by mouth daily.     clopidogrel (PLAVIX) 75 MG tablet Take 1 tablet (75 mg total) by mouth daily after breakfast. 90 tablet 1   diazepam (VALIUM) 5 MG tablet TAKE 1/2 TO 1 TABLET BY MOUTH TWICE DAILY ONLY IF NEEDED FOR ANXIETY ATTACK AND LIMIT TO 5 DAYS A WEEK TO AVOID ADDICTION 60 tablet 0   DOXYLAMINE SUCCINATE PO Take 0.5 tablets by mouth at bedtime.      FOLIC ACID PO Take 992 mcg by mouth daily.     ipratropium (ATROVENT) 0.03 % nasal spray Place 1-2 sprays into both nostrils daily as needed (for allergies.). 30 mL 3   Magnesium 500 MG TABS Take 400 mg by mouth at bedtime. Take 439m QID     MELATONIN PO Take 30 mg by mouth at bedtime.     Multiple Vitamins-Minerals (MULTIVITAMIN ADULT PO) Take by mouth daily.     nitroGLYCERIN (NITROSTAT) 0.4 MG SL tablet Place 0.4 mg under the tongue every 5 (five) minutes x 3 doses as needed for chest pain.     SUPER B COMPLEX/C PO Take by  mouth daily.     tamsulosin (FLOMAX) 0.4 MG CAPS capsule Take 1 capsule daily for Prostate 90 capsule 1   tetrahydrozoline-zinc (VISINE-AC) 0.05-0.25 % ophthalmic solution Place 1-2 drops into both eyes 3 (three) times daily as needed (for redness/irritation.).     triamcinolone ointment (KENALOG) 0.1 % Apply 1 application topically 2 (two) times daily. (Patient not taking: Reported on 10/03/2018) 80 g 1   Turmeric 500 MG TABS Take by mouth. Takes 3 in the morning and 3 in the evening     VALERIAN PO Take 400 mg by  mouth daily.     No current facility-administered medications for this visit.     SURGICAL HISTORY:  Past Surgical History:  Procedure Laterality Date   BACK SURGERY     x3- last surgery lumbar- 1998- / fusion    blepheroplasty     both eyesx2   C5-T6 posterior fusion     with [place] and radius screws   [place], 2008 & 2012 multiple stents  total 4 times   cataracts     cataracts removed- /w IOL - both eyes    CERVICAL SPINE SURGERY     COLONOSCOPY     ESOPHAGEAL MANOMETRY     HARDWARE REMOVAL  02/20/2012   Procedure: HARDWARE REMOVAL;  Surgeon: [name], MD;  Location: [facility];  Service: Neurosurgery;  Laterality: N/A;  Hardware Removal   HIATAL HERNIA REPAIR  1979, spring 2009   LAPAROSCOPIC CHOLECYSTECTOMY     & IOC   multiple percutaneous coronary interventions     NASAL SINUS SURGERY     x2, sees Dr. [name], still having problems, states he uses Benadryl PRN- up to 5 times per day    right temporal artery biopsy     right wrist plate insertion  [id number]   ROBOTIC ASSITED PARTIAL NEPHRECTOMY Left 03/02/2018   Procedure: XI ROBOTIC ASSITED LEFT PARTIAL NEPHRECTOMY WITH LYSIS OF ADHESIONS X30 MINUTES.;  Surgeon: Ardis Hughs, MD;  Location: [facility];  Service: Urology;  Laterality: Left;  CLAMP TIME FOR KIDNEY 1053-1110 TOTAL 18MINUTES   Rotator cuff surgery Right    SKIN BIOPSY Right 10/31/2017   Chest    TRANSURETHRAL RESECTION OF BLADDER TUMOR N/A 11/03/2017   Procedure: cystoscopy;  Surgeon: Kathie Rhodes, MD;  Location: [facility];  Service: Urology;  Laterality: N/A;   UMBILICAL HERNIA REPAIR     UPPER GI ENDOSCOPY     VENTRAL HERNIA REPAIR N/A 03/02/2018   Procedure: LAPAROSCOPIC REPAIR OF INCARCERATED INCISIONAL HERNIA WITH LYSIS OF ADHESIONS;  Surgeon: Michael Boston, MD;  Location: [facility];  Service: General;  Laterality: N/A;    REVIEW OF SYSTEMS:  Constitutional: positive for fatigue Eyes: negative Ears, nose, mouth, throat, and face: negative Respiratory: negative Cardiovascular: negative Gastrointestinal: negative Genitourinary:negative Integument/breast: negative Hematologic/lymphatic: negative Musculoskeletal:negative Neurological: negative Behavioral/Psych: negative Endocrine: negative Allergic/Immunologic: negative   PHYSICAL EXAMINATION: General appearance: alert, cooperative, fatigued and no distress Head: Normocephalic, without obvious abnormality, atraumatic Neck: no adenopathy, no JVD, supple, symmetrical, trachea midline and thyroid not enlarged, symmetric, no tenderness/mass/nodules Lymph nodes: Cervical, supraclavicular, and axillary nodes normal. Resp: clear to auscultation bilaterally Back: symmetric, no curvature. ROM normal. No CVA tenderness. Cardio: regular rate and rhythm, S1, S2 normal, no murmur, click, rub or gallop GI: soft, non-tender; bowel sounds normal; no masses,  no organomegaly Extremities: extremities normal, atraumatic, no cyanosis or edema Neurologic: Alert and oriented X 3, normal strength and tone. Normal symmetric reflexes. Normal coordination and gait  ECOG PERFORMANCE STATUS: 1 - Symptomatic but completely ambulatory  Blood pressure 137/73, pulse 66, temperature 97.6 F (36.4 C), temperature source Oral, resp. rate 18, height _0  (1.702 m), weight 201 lb 9.6 oz (91.4 kg), SpO2 95 %.  LABORATORY DATA: Lab Results  Component Value  Date   WBC 5.8 10/09/2018   HGB 15.4 10/09/2018   HCT 44.2 10/09/2018   MCV 88.9 10/09/2018   PLT 166 10/09/2018      Chemistry      Component Value Date/Time   NA  131 (L) 09/11/2018 1355   K 4.0 09/11/2018 1355   CL 96 (L) 09/11/2018 1355   CO2 23 09/11/2018 1355   BUN 28 (H) 09/11/2018 1355   CREATININE 1.17 09/11/2018 1355   CREATININE 1.18 08/28/2018 1055      Component Value Date/Time   CALCIUM 9.0 09/11/2018 1355   ALKPHOS 83 09/11/2018 1355   AST 23 09/11/2018 1355   ALT 18 09/11/2018 1355   BILITOT 0.4 09/11/2018 1355       RADIOGRAPHIC STUDIES: Mr Jeri Cos JH Contrast  Result Date: 10/04/2018 CLINICAL DATA:  Recently diagnosed non-small cell lung cancer. Staging. EXAM: MRI HEAD WITHOUT AND WITH CONTRAST TECHNIQUE: Multiplanar, multiecho pulse sequences of the brain and surrounding structures were obtained without and with intravenous contrast. CONTRAST:  22m MULTIHANCE GADOBENATE DIMEGLUMINE 529 MG/ML IV SOLN COMPARISON:  Head CT 06/09/2007 FINDINGS: Brain: There is no evidence of acute infarct, intracranial hemorrhage, mass, midline shift, or extra-axial fluid collection. Mild cerebral atrophy is within normal limits for age. Small foci of T2 hyperintensity scattered throughout the cerebral white matter bilaterally are nonspecific but compatible with mild chronic small vessel ischemic disease. No abnormal enhancement is identified. Vascular: Major intracranial vascular flow voids are preserved. Skull and upper cervical spine: Unremarkable bone marrow signal. Sinuses/Orbits: Bilateral cataract extraction. At most minimal mucosal thickening in the paranasal sinuses and at most trace right mastoid fluid. Other: None. IMPRESSION: 1. No evidence of intracranial metastases. 2. Mild chronic small vessel ischemic disease. Electronically Signed   By: ALogan BoresM.D.   On: 10/04/2018 13:58   Ct Biopsy  Result Date: 09/20/2018 CLINICAL DATA:  Left upper lobe lung mass and  hypermetabolic lytic lesion of the left iliac bone. EXAM: CT GUIDED CORE BIOPSY OF LEFT ILIAC BONE LESION ANESTHESIA/SEDATION: 3.0 mg IV Versed; 100 mcg IV Fentanyl Total Moderate Sedation Time:  25 minutes. The patient's level of consciousness and physiologic status were continuously monitored during the procedure by Radiology nursing. PROCEDURE: The procedure risks, benefits, and alternatives were explained to the patient. Questions regarding the procedure were encouraged and answered. The patient understands and consents to the procedure. A time-out was performed prior to initiating the procedure. The left gluteal region was prepped with chlorhexidine in a sterile fashion, and a sterile drape was applied covering the operative field. A sterile gown and sterile gloves were used for the procedure. Local anesthesia was provided with 1% Lidocaine. CT was performed in a prone position. Initially, a 135gauge needle was advanced to the level of a left iliac bone lesion. A single coaxial 18 gauge core biopsy sample was obtained with tissue submitted in formalin. An 11 gauge bone biopsy needle was then utilized in obtaining 2 additional core biopsy samples at the level of the bone lesion. COMPLICATIONS: None FINDINGS: Imaging demonstrates a permeative destructive lesion involving the upper medial aspect of the posterior left iliac bone measuring just over 2 cm in estimated maximal transverse diameter. Initial 18 gauge sampling yielded only a small amount of tissue. Therefore, a larger bone cutting needle system was utilized and 11 gauge samples obtained. This yielded two intact tissue specimens. IMPRESSION: CT-guided core biopsy performed of destructive lesion of the left iliac bone. Electronically Signed   By: GAletta EdouardM.D.   On: 09/20/2018 13:06    ASSESSMENT AND PLAN: This is a very pleasant 73years old white male recently diagnosed with metastatic non-small cell lung cancer, adenocarcinoma presented with  left upper lobe lung nodule in addition to  metastatic bone disease diagnosed in April 2020. The molecular studies showed no actionable mutations and PDL 1 expression was negative. The patient also has a history of renal cell carcinoma status post wedge resection of the left kidney in September 2019. I had a lengthy discussion with the patient today about his current condition and treatment options.  He understand that he has incurable condition and or the treatment will be of palliative nature.   The patient was given the option of palliative care versus palliative systemic chemotherapy with carboplatin for AUC of 5, Alimta 500 mg/M2 and Keytruda 200 mg IV every 3 weeks. I discussed with the patient the adverse effect of the chemotherapy including but not limited to alopecia, myelosuppression, nausea and vomiting, peripheral neuropathy, liver or renal dysfunction. The patient would like to proceed with the systemic chemotherapy. He will receive vitamin B12 injection today. I will call his pharmacy with prescription for folic acid 1 mg p.o. daily in addition to Compazine 10 mg p.o. every 6 hours as needed for nausea. The patient is expected to start the first cycle of his treatment on Oct 16, 2018. I will arrange for him to have a chemotherapy education class before the first dose of his treatment. He will come back for follow-up visit in 2 weeks for evaluation and management of any adverse effect of his treatment. The patient was advised to call immediately if he has any concerning symptoms in the interval. The patient voices understanding of current disease status and treatment options and is in agreement with the current care plan. All questions were answered. The patient knows to call the clinic with any problems, questions or concerns. We can certainly see the patient much sooner if necessary.  I spent 15 minutes counseling the patient face to face. The total time spent in the appointment was 25  minutes.  Disclaimer: This note was dictated with voice recognition software. Similar sounding words can inadvertently be transcribed and may not be corrected upon review.

## 2018-10-10 LAB — GUARDANT 360

## 2018-10-12 ENCOUNTER — Telehealth: Payer: Self-pay | Admitting: *Deleted

## 2018-10-12 ENCOUNTER — Other Ambulatory Visit: Payer: Medicare Other | Admitting: Internal Medicine

## 2018-10-12 DIAGNOSIS — J0110 Acute frontal sinusitis, unspecified: Secondary | ICD-10-CM

## 2018-10-12 MED ORDER — LEVOFLOXACIN 500 MG PO TABS: ORAL_TABLET | ORAL | 1 refills | Status: DC

## 2018-10-12 NOTE — Telephone Encounter (Signed)
Received call from patient inquiring about his upcoming 1st chemotherapy treatment. Per Dr. Worthy Flank notes, pt is to have chemo education class and then start chemo on Tuesday, 10/16/18. Pt states he has not heard about any appts yet. No appts appear on pt's schedule. High priority scheduling message sent for appts next week.  Informed pt he should hear from a scheduler today. Advised that if he does not, he should call back. Pt voiced understanding.

## 2018-10-12 NOTE — Progress Notes (Signed)
THIS ENCOUNTER IS A VIRTUAL VISIT DUE TO COVID-19 - PATIENT WAS NOT SEEN IN THE OFFICE.  PATIENT HAS CONSENTED TO VIRTUAL VISIT / TELEMEDICINE VISIT   Virtual Visit via telephone Note  I connected with  [name]   on  10/12/2018  by telephone.  I verified that I am speaking with the correct person using two identifiers.    I discussed the limitations of evaluation and management by telemedicine and the availability of in person appointments. The patient expressed understanding and agreed to proceed.  History of Present Illness:         This very nice 73 y.o. MWM  followed with HTN, ASCAD, HLD, Prediabetes and Vitamin D Deficiency and recently s/p Lt Kidney cancer wedge resection  (02/2018) and more recently dx'd with a LUL non-small cell Lung cancer (bone mets)  and scheduled to start Chemo Tx per Dr Julien Nordmann on May 5.      Patient presents now with a 1 week prodrome of increasing sinus pressure / post nasal drainage &  congestion in the frontal brow area with associated sore throat and expectoration of  thick yellow mucopurulent secretions.  The patient does not symptoms concerning for COVID-19 infection such as CP, fever, chills, cough, loss of smell or taste or new SHORTNESS OF BREATH that suggest any further testing/ screening at this time.  Social distancing reinforced today. Medications  .  bisoprolol-hydrochlorothiazide (ZIAC) 5-6.25 MG tablet, Take 1 tablet by mouth daily. .  nitroGLYCERIN (NITROSTAT) 0.4 MG SL tablet, Place 0.4 mg under the tongue every 5 (five) minutes x 3 doses as needed for chest pain. Marland Kitchen  DOXYLAMINE SUCCINATE PO, Take 0.5 tablets by mouth at bedtime.  Marland Kitchen  ipratropium (ATROVENT) 0.03 % nasal spray, Place 1-2 sprays into both nostrils daily as needed (for allergies.).  Marland Kitchen  acetaminophen (TYLENOL) 500 MG tablet, Take 1 tablet (500 mg total) by mouth every 6 (six) hours as needed. .  clopidogrel (PLAVIX) 75 MG tablet, Take 1 tablet (75 mg total) by mouth daily  after breakfast. .  folic acid (FOLVITE) 1 MG tablet, Take 1 tablet (1 mg total) by mouth daily.  .  Alpha-Lipoic Acid 200 MG CAPS, Take by mouth 2 (two) times daily. .  Ascorbic Acid (VITAMIN C) 1000 MG tablet, Take 1,000 mg by mouth 2 (two) times daily. .  Blood Glucose Monitoring Suppl (FREESTYLE FREEDOM LITE) w/Device KIT, Check blood sugar 1 time a day. DX-E11.21 .  Cholecalciferol (VITAMIN D3) 5000 units CAPS, Take 5,000 Units by mouth 2 (two) times daily. .  CHOLINE PO, Take 600 mg by mouth daily. .  diazepam (VALIUM) 5 MG tablet, TAKE 1/2 TO 1 TABLET BY MOUTH TWICE DAILY ONLY IF NEEDED FOR ANXIETY ATTACK AND LIMIT TO 5 DAYS A WEEK TO AVOID ADDICTION .  Magnesium 500 MG TABS, Take 400 mg by mouth at bedtime. Take 445m QID .  MELATONIN PO, Take 30 mg by mouth at bedtime. .  Multiple Vitamins-Minerals (MULTIVITAMIN ADULT PO), Take by mouth daily. .  prochlorperazine (COMPAZINE) 10 MG tablet, Take 1 tablet (10 mg total) by mouth every 6 (six) hours as needed for nausea or vomiting. .  SUPER B COMPLEX/C PO, Take by mouth daily. .  tamsulosin (FLOMAX) 0.4 MG CAPS capsule, Take 1 capsule daily for Prostate .  tetrahydrozoline-zinc (VISINE-AC) 0.05-0.25 % ophthalmic solution, Place 1-2 drops into both eyes 3 (three) times daily as needed (for redness/irritation.). .Marland Kitchen triamcinolone ointment (KENALOG) 0.1 %, Apply 1 application  topically 2 (two) times daily. (Patient not taking: Reported on 10/03/2018) .  Turmeric 500 MG TABS, Take by mouth. Takes 3 in the morning and 3 in the evening .  VALERIAN PO, Take 400 mg by mouth daily.  Problem list He has Hyperlipidemia; Cluster headache syndrome; Essential hypertension; Coronary atherosclerosis; COPD mixed type ([place]); GERD; Lumbago, Chronic; PUD; BPH/Prostatism; Vitamin D deficiency; Medication management; Rhinitis, nonallergic, chronic; Obesity (BMI 30.0-34.9); Encounter for Medicare annual wellness exam; OSA and COPD overlap syndrome ([place]); Aortic  atherosclerosis ([place]); Anxiety; Other abnormal glucose; Left renal mass s/p robotic partial nephrectomy 03/02/2018; Recurrent incisional hernia with incarceration s/p lap repair w mesh 03/02/2018; Chronic kidney disease (CKD), stage III (moderate) ([place]); Renal cell carcinoma, left ([place]); Adenocarcinoma of left lung, stage 4 ([place]); Goals of care, counseling/discussion; Encounter for antineoplastic chemotherapy; and Encounter for antineoplastic immunotherapy on their problem list.   Observations/Objective:  There were no vitals taken for this visit.  General : Well sounding patient in no apparent distress HEENT: no hoarseness, no cough for duration of visit Lungs: speaks in complete sentences, no audible wheezing, no apparent distress Neurological: alert, oriented x 3 Psychiatric: pleasant, judgement appropriate   Assessment and Plan:  1. Subacute frontal sinusitis  - levofloxacin (LEVAQUIN) 500 MG tablet; Take 1 tablet daily with food for infection  Dispense: 15 tablet; Refill: 1  Follow Up Instructions:    I discussed the assessment and treatment plan with the patient. The patient was provided an opportunity to ask questions and all were answered. The patient agreed with the plan and demonstrated an understanding of the instructions.   The patient was advised to call back or seek an in-person evaluation if the symptoms worsen or if the condition fails to improve as anticipated.  I provided 17 minutes of non-face-to-face time during this encounter.  Kirtland Bouchard, MD

## 2018-10-15 ENCOUNTER — Inpatient Hospital Stay: Payer: Medicare Other | Attending: Internal Medicine

## 2018-10-15 ENCOUNTER — Telehealth: Payer: Self-pay | Admitting: *Deleted

## 2018-10-15 DIAGNOSIS — Z5111 Encounter for antineoplastic chemotherapy: Secondary | ICD-10-CM | POA: Insufficient documentation

## 2018-10-15 DIAGNOSIS — C7951 Secondary malignant neoplasm of bone: Secondary | ICD-10-CM | POA: Insufficient documentation

## 2018-10-15 DIAGNOSIS — Z79899 Other long term (current) drug therapy: Secondary | ICD-10-CM | POA: Insufficient documentation

## 2018-10-15 DIAGNOSIS — Z5112 Encounter for antineoplastic immunotherapy: Secondary | ICD-10-CM | POA: Insufficient documentation

## 2018-10-15 DIAGNOSIS — C3412 Malignant neoplasm of upper lobe, left bronchus or lung: Secondary | ICD-10-CM | POA: Insufficient documentation

## 2018-10-16 ENCOUNTER — Encounter: Payer: Self-pay | Admitting: Internal Medicine

## 2018-10-16 NOTE — Telephone Encounter (Signed)
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 ( Hiatal hernia repair? ) . I told pt thank you for information and it should not interfere with him getting treatment.

## 2018-10-16 NOTE — Progress Notes (Signed)
Called pt to introduce myself as his Arboriculturist.  Unfortunately there aren't any foundations offering copay assistance for his Dx and the type of insurance he has.  I offered the [place] and went over what it covers but he declined wanting to leave it for someone in greater need.  I will give him my card on 10/17/18 for any questions or concerns he may have in the future.

## 2018-10-17 ENCOUNTER — Inpatient Hospital Stay: Payer: Medicare Other

## 2018-10-17 ENCOUNTER — Other Ambulatory Visit: Payer: Self-pay | Admitting: Medical Oncology

## 2018-10-17 ENCOUNTER — Other Ambulatory Visit: Payer: Self-pay

## 2018-10-17 ENCOUNTER — Other Ambulatory Visit: Payer: Self-pay | Admitting: Internal Medicine

## 2018-10-17 VITALS — BP 101/67 | HR 72 | Temp 98.9°F | Resp 16

## 2018-10-17 DIAGNOSIS — Z79899 Other long term (current) drug therapy: Secondary | ICD-10-CM | POA: Diagnosis not present

## 2018-10-17 DIAGNOSIS — C3492 Malignant neoplasm of unspecified part of left bronchus or lung: Secondary | ICD-10-CM

## 2018-10-17 DIAGNOSIS — I878 Other specified disorders of veins: Secondary | ICD-10-CM

## 2018-10-17 DIAGNOSIS — C3412 Malignant neoplasm of upper lobe, left bronchus or lung: Secondary | ICD-10-CM | POA: Diagnosis not present

## 2018-10-17 DIAGNOSIS — Z5112 Encounter for antineoplastic immunotherapy: Secondary | ICD-10-CM | POA: Diagnosis not present

## 2018-10-17 DIAGNOSIS — C7951 Secondary malignant neoplasm of bone: Secondary | ICD-10-CM | POA: Diagnosis not present

## 2018-10-17 DIAGNOSIS — Z5111 Encounter for antineoplastic chemotherapy: Secondary | ICD-10-CM | POA: Diagnosis not present

## 2018-10-17 DIAGNOSIS — R5382 Chronic fatigue, unspecified: Secondary | ICD-10-CM

## 2018-10-17 LAB — CBC WITH DIFFERENTIAL (CANCER CENTER ONLY)
Abs Immature Granulocytes: 0.03 10*3/uL (ref 0.00–0.07)
Basophils Absolute: 0 10*3/uL (ref 0.0–0.1)
Basophils Relative: 1 %
Eosinophils Absolute: 0.1 10*3/uL (ref 0.0–0.5)
Eosinophils Relative: 2 %
HCT: 43.7 % (ref 39.0–52.0)
Hemoglobin: 14.9 g/dL (ref 13.0–17.0)
Immature Granulocytes: 1 %
Lymphocytes Relative: 27 %
Lymphs Abs: 1.5 10*3/uL (ref 0.7–4.0)
MCH: 31 pg (ref 26.0–34.0)
MCHC: 34.1 g/dL (ref 30.0–36.0)
MCV: 91 fL (ref 80.0–100.0)
Monocytes Absolute: 0.5 10*3/uL (ref 0.1–1.0)
Monocytes Relative: 10 %
Neutro Abs: 3.4 10*3/uL (ref 1.7–7.7)
Neutrophils Relative %: 59 %
Platelet Count: 162 10*3/uL (ref 150–400)
RBC: 4.8 MIL/uL (ref 4.22–5.81)
RDW: 13.6 % (ref 11.5–15.5)
WBC Count: 5.6 10*3/uL (ref 4.0–10.5)
nRBC: 0 % (ref 0.0–0.2)

## 2018-10-17 LAB — CMP (CANCER CENTER ONLY)
ALT: 16 U/L (ref 0–44)
AST: 19 U/L (ref 15–41)
Albumin: 3.5 g/dL (ref 3.5–5.0)
Alkaline Phosphatase: 75 U/L (ref 38–126)
Anion gap: 10 (ref 5–15)
BUN: 23 mg/dL (ref 8–23)
CO2: 21 mmol/L — ABNORMAL LOW (ref 22–32)
Calcium: 9.2 mg/dL (ref 8.9–10.3)
Chloride: 100 mmol/L (ref 98–111)
Creatinine: 1.13 mg/dL (ref 0.61–1.24)
GFR, Est AFR Am: >60 mL/min (ref >60)
GFR, Estimated: >60 mL/min (ref >60)
Glucose, Bld: 112 mg/dL — ABNORMAL HIGH (ref 70–99)
Potassium: 4.1 mmol/L (ref 3.5–5.1)
Sodium: 131 mmol/L — ABNORMAL LOW (ref 135–145)
Total Bilirubin: 0.4 mg/dL (ref 0.3–1.2)
Total Protein: 7.1 g/dL (ref 6.5–8.1)

## 2018-10-17 LAB — TSH: TSH: 1.557 u[IU]/mL (ref 0.320–4.118)

## 2018-10-17 MED ORDER — SODIUM CHLORIDE 0.9 % IV SOLN
Freq: Once | INTRAVENOUS | Status: AC
  Administered 2018-10-17: 09:00:00 via INTRAVENOUS
  Filled 2018-10-17: qty 5

## 2018-10-17 MED ORDER — PALONOSETRON HCL INJECTION 0.25 MG/5ML
0.2500 mg | Freq: Once | INTRAVENOUS | Status: AC
  Administered 2018-10-17: 0.25 mg via INTRAVENOUS

## 2018-10-17 MED ORDER — SODIUM CHLORIDE 0.9 % IV SOLN
Freq: Once | INTRAVENOUS | Status: AC
  Administered 2018-10-17: 09:00:00 via INTRAVENOUS
  Filled 2018-10-17: qty 250

## 2018-10-17 MED ORDER — PALONOSETRON HCL INJECTION 0.25 MG/5ML
INTRAVENOUS | Status: AC
  Filled 2018-10-17: qty 5

## 2018-10-17 MED ORDER — SODIUM CHLORIDE 0.9 % IV SOLN
485.0000 mg/m2 | Freq: Once | INTRAVENOUS | Status: AC
  Administered 2018-10-17: 1000 mg via INTRAVENOUS
  Filled 2018-10-17: qty 40

## 2018-10-17 MED ORDER — SODIUM CHLORIDE 0.9 % IV SOLN
200.0000 mg | Freq: Once | INTRAVENOUS | Status: AC
  Administered 2018-10-17: 200 mg via INTRAVENOUS
  Filled 2018-10-17: qty 8

## 2018-10-17 MED ORDER — SODIUM CHLORIDE 0.9 % IV SOLN
495.5000 mg | Freq: Once | INTRAVENOUS | Status: AC
  Administered 2018-10-17: 500 mg via INTRAVENOUS
  Filled 2018-10-17: qty 50

## 2018-10-17 NOTE — Progress Notes (Signed)
Per Dr. Julien Nordmann, [place] to give premeds before labs result.

## 2018-10-17 NOTE — Patient Instructions (Signed)
[facility] Discharge Instructions for Patients Receiving Chemotherapy  Today you received the following chemotherapy agents: Keytruda, Alimta, and Carboplatin.  To help prevent nausea and vomiting after your treatment, we encourage you to take your nausea medication as prescribed.   If you develop nausea and vomiting that is not controlled by your nausea medication, call the clinic.   BELOW ARE SYMPTOMS THAT SHOULD BE REPORTED IMMEDIATELY:  *FEVER GREATER THAN 100.5 F  *CHILLS WITH OR WITHOUT FEVER  NAUSEA AND VOMITING THAT IS NOT CONTROLLED WITH YOUR NAUSEA MEDICATION  *UNUSUAL SHORTNESS OF BREATH  *UNUSUAL BRUISING OR BLEEDING  TENDERNESS IN MOUTH AND THROAT WITH OR WITHOUT PRESENCE OF ULCERS  *URINARY PROBLEMS  *BOWEL PROBLEMS  UNUSUAL RASH Items with * indicate a potential emergency and should be followed up as soon as possible.  Feel free to call the clinic should you have any questions or concerns. The clinic phone number is [phone].  Please show the [facility] at check-in to the Emergency Department and triage nurse.   Pembrolizumab injection Beryle Flock) What is this medicine? PEMBROLIZUMAB (pem broe liz ue mab) is a monoclonal antibody. It is used to treat cervical cancer, esophageal cancer, head and neck cancer, hepatocellular cancer, Hodgkin lymphoma, kidney cancer, lymphoma, melanoma, Merkel cell carcinoma, lung cancer, stomach cancer, urothelial cancer, and cancers that have a certain genetic condition. This medicine may be used for other purposes; ask your health care provider or pharmacist if you have questions. COMMON BRAND NAME(S): Keytruda What should I tell my health care provider before I take this medicine? They need to know if you have any of these conditions: -diabetes -immune system problems -inflammatory bowel disease -liver disease -lung or breathing disease -lupus -received or scheduled to receive an organ transplant  or a stem-cell transplant that uses donor stem cells -an unusual or allergic reaction to pembrolizumab, other medicines, foods, dyes, or preservatives -pregnant or trying to get pregnant -breast-feeding How should I use this medicine? This medicine is for infusion into a vein. It is given by a health care professional in a hospital or clinic setting. A special MedGuide will be given to you before each treatment. Be sure to read this information carefully each time. Talk to your pediatrician regarding the use of this medicine in children. While this drug may be prescribed for selected conditions, precautions do apply. Overdosage: If you think you have taken too much of this medicine contact a poison control center or emergency room at once. NOTE: This medicine is only for you. Do not share this medicine with others. What if I miss a dose? It is important not to miss your dose. Call your doctor or health care professional if you are unable to keep an appointment. What may interact with this medicine? Interactions have not been studied. Give your health care provider a list of all the medicines, herbs, non-prescription drugs, or dietary supplements you use. Also tell them if you smoke, drink alcohol, or use illegal drugs. Some items may interact with your medicine. This list may not describe all possible interactions. Give your health care provider a list of all the medicines, herbs, non-prescription drugs, or dietary supplements you use. Also tell them if you smoke, drink alcohol, or use illegal drugs. Some items may interact with your medicine. What should I watch for while using this medicine? Your condition will be monitored carefully while you are receiving this medicine. You may need blood work done while you are taking this medicine.  Do not become pregnant while taking this medicine or for 4 months after stopping it. Women should inform their doctor if they wish to become pregnant or think  they might be pregnant. There is a potential for serious side effects to an unborn child. Talk to your health care professional or pharmacist for more information. Do not breast-feed an infant while taking this medicine or for 4 months after the last dose. What side effects may I notice from receiving this medicine? Side effects that you should report to your doctor or health care professional as soon as possible: -allergic reactions like skin rash, itching or hives, swelling of the face, lips, or tongue -bloody or black, tarry -breathing problems -changes in vision -chest pain -chills -confusion -constipation -cough -diarrhea -dizziness or feeling faint or lightheaded -fast or irregular heartbeat -fever -flushing -hair loss -joint pain -low blood counts - this medicine may decrease the number of white blood cells, red blood cells and platelets. You may be at increased risk for infections and bleeding. -muscle pain -muscle weakness -persistent headache -redness, blistering, peeling or loosening of the skin, including inside the mouth -signs and symptoms of high blood sugar such as dizziness; dry mouth; dry skin; fruity breath; nausea; stomach pain; increased hunger or thirst; increased urination -signs and symptoms of kidney injury like trouble passing urine or change in the amount of urine -signs and symptoms of liver injury like dark urine, light-colored stools, loss of appetite, nausea, right upper belly pain, yellowing of the eyes or skin -sweating -swollen lymph nodes -weight loss Side effects that usually do not require medical attention (report to your doctor or health care professional if they continue or are bothersome): -decreased appetite -muscle pain -tiredness This list may not describe all possible side effects. Call your doctor for medical advice about side effects. You may report side effects to FDA at 1-800-FDA-1088. Where should I keep my medicine? This drug is  given in a hospital or clinic and will not be stored at home. NOTE: This sheet is a summary. It may not cover all possible information. If you have questions about this medicine, talk to your doctor, pharmacist, or health care provider.  2019 Elsevier/Gold Standard (2018-01-11 15:06:10)  Pemetrexed injection (Alimta) What is this medicine? PEMETREXED (PEM e TREX ed) is a chemotherapy drug used to treat lung cancers like non-small cell lung cancer and mesothelioma. It may also be used to treat other cancers. This medicine may be used for other purposes; ask your health care provider or pharmacist if you have questions. COMMON BRAND NAME(S): Alimta What should I tell my health care provider before I take this medicine? They need to know if you have any of these conditions: -infection (especially a virus infection such as chickenpox, cold sores, or herpes) -kidney disease -low blood counts, like low white cell, platelet, or red cell counts -lung or breathing disease, like asthma -radiation therapy -an unusual or allergic reaction to pemetrexed, other medicines, foods, dyes, or preservative -pregnant or trying to get pregnant -breast-feeding How should I use this medicine? This drug is given as an infusion into a vein. It is administered in a hospital or clinic by a specially trained health care professional. Talk to your pediatrician regarding the use of this medicine in children. Special care may be needed. Overdosage: If you think you have taken too much of this medicine contact a poison control center or emergency room at once. NOTE: This medicine is only for you. Do not share this  medicine with others. What if I miss a dose? It is important not to miss your dose. Call your doctor or health care professional if you are unable to keep an appointment. What may interact with this medicine? This medicine may interact with the following medications: -Ibuprofen This list may not describe all  possible interactions. Give your health care provider a list of all the medicines, herbs, non-prescription drugs, or dietary supplements you use. Also tell them if you smoke, drink alcohol, or use illegal drugs. Some items may interact with your medicine. What should I watch for while using this medicine? Visit your doctor for checks on your progress. This drug may make you feel generally unwell. This is not uncommon, as chemotherapy can affect healthy cells as well as cancer cells. Report any side effects. Continue your course of treatment even though you feel ill unless your doctor tells you to stop. In some cases, you may be given additional medicines to help with side effects. Follow all directions for their use. Call your doctor or health care professional for advice if you get a fever, chills or sore throat, or other symptoms of a cold or flu. Do not treat yourself. This drug decreases your body's ability to fight infections. Try to avoid being around people who are sick. This medicine may increase your risk to bruise or bleed. Call your doctor or health care professional if you notice any unusual bleeding. Be careful brushing and flossing your teeth or using a toothpick because you may get an infection or bleed more easily. If you have any dental work done, tell your dentist you are receiving this medicine. Avoid taking products that contain aspirin, acetaminophen, ibuprofen, naproxen, or ketoprofen unless instructed by your doctor. These medicines may hide a fever. Call your doctor or health care professional if you get diarrhea or mouth sores. Do not treat yourself. To protect your kidneys, drink water or other fluids as directed while you are taking this medicine. Do not become pregnant while taking this medicine or for 6 months after stopping it. Women should inform their doctor if they wish to become pregnant or think they might be pregnant. Men should not father a child while taking this  medicine and for 3 months after stopping it. This may interfere with the ability to father a child. You should talk to your doctor or health care professional if you are concerned about your fertility. There is a potential for serious side effects to an unborn child. Talk to your health care professional or pharmacist for more information. Do not breast-feed an infant while taking this medicine or for 1 week after stopping it. What side effects may I notice from receiving this medicine? Side effects that you should report to your doctor or health care professional as soon as possible: -allergic reactions like skin rash, itching or hives, swelling of the face, lips, or tongue -breathing problems -redness, blistering, peeling or loosening of the skin, including inside the mouth -signs and symptoms of bleeding such as bloody or black, tarry stools; red or dark-brown urine; spitting up blood or brown material that looks like coffee grounds; red spots on the skin; unusual bruising or bleeding from the eye, gums, or nose -signs and symptoms of infection like fever or chills; cough; sore throat; pain or trouble passing urine -signs and symptoms of kidney injury like trouble passing urine or change in the amount of urine -signs and symptoms of liver injury like dark yellow or brown urine; general  ill feeling or flu-like symptoms; light-colored stools; loss of appetite; nausea; right upper belly pain; unusually weak or tired; yellowing of the eyes or skin Side effects that usually do not require medical attention (report to your doctor or health care professional if they continue or are bothersome): -constipation -mouth sores -nausea, vomiting -unusually weak or tired This list may not describe all possible side effects. Call your doctor for medical advice about side effects. You may report side effects to FDA at 1-800-FDA-1088. Where should I keep my medicine? This drug is given in a hospital or clinic and  will not be stored at home. NOTE: This sheet is a summary. It may not cover all possible information. If you have questions about this medicine, talk to your doctor, pharmacist, or health care provider.  2019 Elsevier/Gold Standard (2017-07-19 16:11:33)  Carboplatin injection What is this medicine? CARBOPLATIN (KAR boe pla tin) is a chemotherapy drug. It targets fast dividing cells, like cancer cells, and causes these cells to die. This medicine is used to treat ovarian cancer and many other cancers. This medicine may be used for other purposes; ask your health care provider or pharmacist if you have questions. COMMON BRAND NAME(S): Paraplatin What should I tell my health care provider before I take this medicine? They need to know if you have any of these conditions: -blood disorders -hearing problems -kidney disease -recent or ongoing radiation therapy -an unusual or allergic reaction to carboplatin, cisplatin, other chemotherapy, other medicines, foods, dyes, or preservatives -pregnant or trying to get pregnant -breast-feeding How should I use this medicine? This drug is usually given as an infusion into a vein. It is administered in a hospital or clinic by a specially trained health care professional. Talk to your pediatrician regarding the use of this medicine in children. Special care may be needed. Overdosage: If you think you have taken too much of this medicine contact a poison control center or emergency room at once. NOTE: This medicine is only for you. Do not share this medicine with others. What if I miss a dose? It is important not to miss a dose. Call your doctor or health care professional if you are unable to keep an appointment. What may interact with this medicine? -medicines for seizures -medicines to increase blood counts like filgrastim, pegfilgrastim, sargramostim -some antibiotics like amikacin, gentamicin, neomycin, streptomycin, tobramycin -vaccines Talk to  your doctor or health care professional before taking any of these medicines: -acetaminophen -aspirin -ibuprofen -ketoprofen -naproxen This list may not describe all possible interactions. Give your health care provider a list of all the medicines, herbs, non-prescription drugs, or dietary supplements you use. Also tell them if you smoke, drink alcohol, or use illegal drugs. Some items may interact with your medicine. What should I watch for while using this medicine? Your condition will be monitored carefully while you are receiving this medicine. You will need important blood work done while you are taking this medicine. This drug may make you feel generally unwell. This is not uncommon, as chemotherapy can affect healthy cells as well as cancer cells. Report any side effects. Continue your course of treatment even though you feel ill unless your doctor tells you to stop. In some cases, you may be given additional medicines to help with side effects. Follow all directions for their use. Call your doctor or health care professional for advice if you get a fever, chills or sore throat, or other symptoms of a cold or flu. Do not treat yourself.  This drug decreases your body's ability to fight infections. Try to avoid being around people who are sick. This medicine may increase your risk to bruise or bleed. Call your doctor or health care professional if you notice any unusual bleeding. Be careful brushing and flossing your teeth or using a toothpick because you may get an infection or bleed more easily. If you have any dental work done, tell your dentist you are receiving this medicine. Avoid taking products that contain aspirin, acetaminophen, ibuprofen, naproxen, or ketoprofen unless instructed by your doctor. These medicines may hide a fever. Do not become pregnant while taking this medicine. Women should inform their doctor if they wish to become pregnant or think they might be pregnant. There is a  potential for serious side effects to an unborn child. Talk to your health care professional or pharmacist for more information. Do not breast-feed an infant while taking this medicine. What side effects may I notice from receiving this medicine? Side effects that you should report to your doctor or health care professional as soon as possible: -allergic reactions like skin rash, itching or hives, swelling of the face, lips, or tongue -signs of infection - fever or chills, cough, sore throat, pain or difficulty passing urine -signs of decreased platelets or bleeding - bruising, pinpoint red spots on the skin, black, tarry stools, nosebleeds -signs of decreased red blood cells - unusually weak or tired, fainting spells, lightheadedness -breathing problems -changes in hearing -changes in vision -chest pain -high blood pressure -low blood counts - This drug may decrease the number of white blood cells, red blood cells and platelets. You may be at increased risk for infections and bleeding. -nausea and vomiting -pain, swelling, redness or irritation at the injection site -pain, tingling, numbness in the hands or feet -problems with balance, talking, walking -trouble passing urine or change in the amount of urine Side effects that usually do not require medical attention (report to your doctor or health care professional if they continue or are bothersome): -hair loss -loss of appetite -metallic taste in the mouth or changes in taste This list may not describe all possible side effects. Call your doctor for medical advice about side effects. You may report side effects to FDA at 1-800-FDA-1088. Where should I keep my medicine? This drug is given in a hospital or clinic and will not be stored at home. NOTE: This sheet is a summary. It may not cover all possible information. If you have questions about this medicine, talk to your doctor, pharmacist, or health care provider.  2019 Elsevier/Gold  Standard (2007-09-04 14:38:05)

## 2018-10-18 ENCOUNTER — Telehealth: Payer: Self-pay | Admitting: *Deleted

## 2018-10-18 NOTE — Telephone Encounter (Signed)
-----   Message from Levada Schilling, RN sent at 10/17/2018 12:22 PM EDT ----- Regarding: [name] 1st tx f/u call 1st tx f/u call; pt is also waiting to hear about port placement. Thanks!  Colen Darling Alimta Norma Fredrickson

## 2018-10-18 NOTE — Telephone Encounter (Signed)
TCT patient to f/u with him after his first chemo on 10/17/18. Spoke with patient. He states he feels fairly well. C/o dry mouth and some fatigue.  Encouraged oral hydration. He voiced understanding. Denies n/v/d. No fever or chills.  He is aware of port placement date and time.  He knows to call with any questions or concerns

## 2018-10-19 ENCOUNTER — Telehealth: Payer: Self-pay | Admitting: *Deleted

## 2018-10-19 NOTE — Telephone Encounter (Signed)
Received call from patient seeking to clarify his upcominf appts. Reviewed appts with him. No other questions or concerns

## 2018-10-24 ENCOUNTER — Inpatient Hospital Stay: Payer: Medicare Other

## 2018-10-24 ENCOUNTER — Encounter: Payer: Self-pay | Admitting: Physician Assistant

## 2018-10-24 ENCOUNTER — Other Ambulatory Visit: Payer: Self-pay

## 2018-10-24 ENCOUNTER — Inpatient Hospital Stay (HOSPITAL_BASED_OUTPATIENT_CLINIC_OR_DEPARTMENT_OTHER): Payer: Medicare Other | Admitting: Physician Assistant

## 2018-10-24 VITALS — BP 130/76 | HR 76 | Temp 97.8°F | Resp 18 | Ht 67.0 in | Wt 204.2 lb

## 2018-10-24 DIAGNOSIS — Z79899 Other long term (current) drug therapy: Secondary | ICD-10-CM | POA: Diagnosis not present

## 2018-10-24 DIAGNOSIS — C7951 Secondary malignant neoplasm of bone: Secondary | ICD-10-CM | POA: Diagnosis not present

## 2018-10-24 DIAGNOSIS — Z95828 Presence of other vascular implants and grafts: Secondary | ICD-10-CM

## 2018-10-24 DIAGNOSIS — R5383 Other fatigue: Secondary | ICD-10-CM | POA: Diagnosis not present

## 2018-10-24 DIAGNOSIS — Z5111 Encounter for antineoplastic chemotherapy: Secondary | ICD-10-CM | POA: Diagnosis not present

## 2018-10-24 DIAGNOSIS — C3412 Malignant neoplasm of upper lobe, left bronchus or lung: Secondary | ICD-10-CM | POA: Diagnosis not present

## 2018-10-24 DIAGNOSIS — C3492 Malignant neoplasm of unspecified part of left bronchus or lung: Secondary | ICD-10-CM

## 2018-10-24 DIAGNOSIS — K5900 Constipation, unspecified: Secondary | ICD-10-CM | POA: Diagnosis not present

## 2018-10-24 DIAGNOSIS — Z5112 Encounter for antineoplastic immunotherapy: Secondary | ICD-10-CM | POA: Diagnosis not present

## 2018-10-24 LAB — CBC WITH DIFFERENTIAL (CANCER CENTER ONLY)
Abs Immature Granulocytes: 0.01 10*3/uL (ref 0.00–0.07)
Basophils Absolute: 0 10*3/uL (ref 0.0–0.1)
Basophils Relative: 1 %
Eosinophils Absolute: 0.2 10*3/uL (ref 0.0–0.5)
Eosinophils Relative: 6 %
HCT: 39 % (ref 39.0–52.0)
Hemoglobin: 13.5 g/dL (ref 13.0–17.0)
Immature Granulocytes: 0 %
Lymphocytes Relative: 31 %
Lymphs Abs: 1.1 10*3/uL (ref 0.7–4.0)
MCH: 31.2 pg (ref 26.0–34.0)
MCHC: 34.6 g/dL (ref 30.0–36.0)
MCV: 90.1 fL (ref 80.0–100.0)
Monocytes Absolute: 0.3 10*3/uL (ref 0.1–1.0)
Monocytes Relative: 8 %
Neutro Abs: 2 10*3/uL (ref 1.7–7.7)
Neutrophils Relative %: 54 %
Platelet Count: 130 10*3/uL — ABNORMAL LOW (ref 150–400)
RBC: 4.33 MIL/uL (ref 4.22–5.81)
RDW: 13.3 % (ref 11.5–15.5)
WBC Count: 3.7 10*3/uL — ABNORMAL LOW (ref 4.0–10.5)
nRBC: 0 % (ref 0.0–0.2)

## 2018-10-24 LAB — CMP (CANCER CENTER ONLY)
ALT: 34 U/L (ref 0–44)
AST: 31 U/L (ref 15–41)
Albumin: 3.1 g/dL — ABNORMAL LOW (ref 3.5–5.0)
Alkaline Phosphatase: 85 U/L (ref 38–126)
Anion gap: 9 (ref 5–15)
BUN: 29 mg/dL — ABNORMAL HIGH (ref 8–23)
CO2: 23 mmol/L (ref 22–32)
Calcium: 8.6 mg/dL — ABNORMAL LOW (ref 8.9–10.3)
Chloride: 96 mmol/L — ABNORMAL LOW (ref 98–111)
Creatinine: 1.24 mg/dL (ref 0.61–1.24)
GFR, Est AFR Am: >60 mL/min (ref >60)
GFR, Estimated: 57 mL/min — ABNORMAL LOW (ref >60)
Glucose, Bld: 94 mg/dL (ref 70–99)
Potassium: 4.3 mmol/L (ref 3.5–5.1)
Sodium: 128 mmol/L — ABNORMAL LOW (ref 135–145)
Total Bilirubin: 0.3 mg/dL (ref 0.3–1.2)
Total Protein: 6.6 g/dL (ref 6.5–8.1)

## 2018-10-24 MED ORDER — LIDOCAINE-PRILOCAINE 2.5-2.5 % EX CREA: 1.0000 "application " | TOPICAL_CREAM | CUTANEOUS | 0 refills | Status: AC | PRN

## 2018-10-24 NOTE — Progress Notes (Signed)
[name] OFFICE PROGRESS NOTE  [name], [address] [place] [address]  DIAGNOSIS:  1)Stage IV (T1c, N0, M1 C) non-small cell lung cancer, adenocarcinoma presented with left upper lobe lung nodule in addition to metastatic disease to the bone diagnosed in April 2020. 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   Molecular studies by [facility] 211  STK11Splice Site SNV 9.4% Everolimus,Temsirolimus  [id number] 0.8% None (VUS), No (VUS)  NTRK3 L629L 1.5%  (Synonymous)          No (Synonymous)  KRAS G12D 1.1% Binimetinib  CDKN2A H83Y 1.6% Abemaciclib, Palbociclib, Ribociclib  PDL1 TPS was 0%  PRIOR THERAPY: None  CURRENT THERAPY: Systemic chemotherapy with carboplatin for AUC of 5, Alimta 500 mg/M2 and Keytruda 200 mg IV every 3 weeks.  First dose Oct 16, 2018. Status post 1 cycle of treatment.   INTERVAL HISTORY: Darrell WeatherlySouthern 73y.o. male returns to the clinic for follow-up visit.  The patient is feeling feeling well today. He recently completed his first cycle of treatment and he tolerated it well without any adverse effects except for mild fatigue and mild constipation which was managed successfully with OTC medications. He denies any fever, chills, night sweats, or weight loss.  He denies any chest pain, shortness of breath, cough, or hemoptysis.  He denies any significant nausea. He states he had to take 1 nausea medication and denies vomiting.  He denies any headache or visual changes.  He denies any rashes or skin changes.  He is here today for a one-week follow-up after completing his first cycle of treatment.   MEDICAL HISTORY: Past Medical History:  Diagnosis Date  . Anxiety    treated /w Xanax for mild depression , using 2 times per day, not PRN  . Asthma   . Benign prostatic hypertrophy   . CAD (coronary artery disease)    pt  last left heart cath was in Nov 2008. EF was 55% on left ventriculogram. Circumflex was totally occluded after the 1st obtuse marginal with collaterals supplying the distal circumflex. LAD showed luminal irregularities. The stent in LAD was patent. The RCA showed luminal irregularities. The stent in th emid RCA and PDA were patent. There were no inverventions.   . Chest pain    from anxiety last time 1 month ago  . Chromophobe renal cell carcinoma ([place] 02[place]   s/p left kidney wedge resection  . Chronic obstructive pulmonary disease (HCC)    asthma  . Cluster headache    relative to sinus problems none recnt  . Constipation   . Depression   . DM2 (diabetes mellitus, type 2) ([place]    well with diet and exercise controlled  . GERD (gastroesophageal reflux disease)    hiatal hernia. Patient did have a Nissen fundoplication rare  . Heart murmur    heard 30 yrs ago  . History of blood transfusion    need for Bld. transfusion relative to taking NSAIDS  . HTN (hypertension)    pt. followed by [facility], last cardiac visit 2012, one yr. ago  . Hyperlipidemia   . Joint pain   . Lactose intolerance   . Left kidney mass   . Low back pain    chronic  . Metastatic non-small cell lung cancer (HCC)    adenocarcinoma, LUL, bone metastasis in 09/2018  . Neuromuscular disorder ([place]    nerve  involvement in back & upper back relative to hardware in neck   . Obesity   . OSA and COPD overlap syndrome ([place]) 02/25/2015   no cpap used pt denies sleep apnea  . Osteoarthritis   . Peptic ulcer disease   . Pneumonia not recent per pt  . 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 Sleep concern    states the study (2003 )was done at [facility] it failed & he was told to return & he never did. Pt. states he has been found by prev. hosp. staff that he has to be told when to breathe & his wife does the same.   Marland Kitchen Spinal fracture    hx of traumatic in Jun 04, 2007 after falling off the roof  . Tinnitus, bilateral   . Vitamin D deficiency     ALLERGIES:  is allergic to codeine; morphine and related; nsaids; oxycodone; crestor [rosuvastatin]; cymbalta [duloxetine hcl]; dilaudid [hydromorphone hcl]; effexor [venlafaxine]; gluten meal; topamax [topiramate]; tramadol hcl; trazodone and nefazodone; adhesive [tape]; penicillins; and sulfa antibiotics.  MEDICATIONS:  Current Outpatient Medications  Medication Sig Dispense Refill  . acetaminophen (TYLENOL) 500 MG tablet Take 1 tablet (500 mg total) by mouth every 6 (six) hours as needed. 30 tablet 0  . Alpha-Lipoic Acid 200 MG CAPS Take by mouth 2 (two) times daily.    . Ascorbic Acid (VITAMIN C) 1000 MG tablet Take 1,000 mg by mouth 2 (two) times daily.    . bisoprolol-hydrochlorothiazide (ZIAC) 5-6.25 MG tablet Take 1 tablet by mouth daily. 90 tablet 1  . Blood Glucose Monitoring Suppl (FREESTYLE FREEDOM LITE) w/Device KIT Check blood sugar 1 time a day. DX-E11.21 1 each 0  . Cholecalciferol (VITAMIN D3) 5000 units CAPS Take 5,000 Units by mouth 2 (two) times daily.    . CHOLINE PO Take 600 mg by mouth daily.    . clopidogrel (PLAVIX) 75 MG tablet Take 1 tablet (75 mg total) by mouth daily after breakfast. 90 tablet 1  . diazepam (VALIUM) 5 MG tablet TAKE 1/2 TO 1 TABLET BY MOUTH TWICE DAILY ONLY IF NEEDED FOR ANXIETY ATTACK AND LIMIT TO 5 DAYS A WEEK TO AVOID ADDICTION 60 tablet 0  . DOXYLAMINE SUCCINATE PO Take 0.5 tablets by mouth at bedtime.     . folic acid (FOLVITE) 1 MG tablet Take 1 tablet (1 mg total) by mouth daily. 30 tablet 4  . ipratropium (ATROVENT) 0.03 % nasal spray Place 1-2 sprays into both nostrils daily as needed (for allergies.). 30 mL 3  . levofloxacin (LEVAQUIN) 500 MG tablet Take 1 tablet daily with food for infection 15 tablet 1  . lidocaine-prilocaine (EMLA) cream Apply 1 application topically as needed. 30 g 0  . Magnesium 500 MG TABS Take 400 mg by mouth at bedtime. Take  461m QID    . MELATONIN PO Take 30 mg by mouth at bedtime.    . Multiple Vitamins-Minerals (MULTIVITAMIN ADULT PO) Take by mouth daily.    . nitroGLYCERIN (NITROSTAT) 0.4 MG SL tablet Place 0.4 mg under the tongue every 5 (five) minutes x 3 doses as needed for chest pain.    .Marland Kitchenprochlorperazine (COMPAZINE) 10 MG tablet Take 1 tablet (10 mg total) by mouth every 6 (six) hours as needed for nausea or vomiting. 30 tablet 0  . SUPER B COMPLEX/C PO Take by mouth daily.    . tamsulosin (FLOMAX) 0.4 MG CAPS  capsule Take 1 capsule daily for Prostate 90 capsule 1  . tetrahydrozoline-zinc (VISINE-AC) 0.05-0.25 % ophthalmic solution Place 1-2 drops into both eyes 3 (three) times daily as needed (for redness/irritation.).    Marland Kitchen triamcinolone ointment (KENALOG) 0.1 % Apply 1 application topically 2 (two) times daily. (Patient not taking: Reported on 10/03/2018) 80 g 1  . Turmeric 500 MG TABS Take by mouth. Takes 3 in the morning and 3 in the evening    . VALERIAN PO Take 400 mg by mouth daily.     No current facility-administered medications for this visit.     SURGICAL HISTORY:  Past Surgical History:  Procedure Laterality Date  . BACK SURGERY     x3- last surgery lumbar- 1998- / fusion   . blepheroplasty     both eyesx2  . C5-T6 posterior fusion     with Oasis and radius screws  . [place], 2008 & 2012 multiple stents  total 4 times  . cataracts     cataracts removed- /w IOL - both eyes   . CERVICAL SPINE SURGERY    . COLONOSCOPY    . ESOPHAGEAL MANOMETRY    . HARDWARE REMOVAL  02/20/2012   Procedure: HARDWARE REMOVAL;  Surgeon: [name], MD;  Location: [facility];  Service: Neurosurgery;  Laterality: N/A;  Hardware Removal  . HIATAL HERNIA REPAIR  1979, spring 2009  . LAPAROSCOPIC CHOLECYSTECTOMY     & IOC  . multiple percutaneous coronary interventions    . NASAL SINUS SURGERY     x2, sees Dr. [name], still having problems, states he uses Benadryl PRN- up to 5  times per day   . right temporal artery biopsy    . right wrist plate insertion  15/1761  . ROBOTIC ASSITED PARTIAL NEPHRECTOMY Left 03/02/2018   Procedure: XI ROBOTIC ASSITED LEFT PARTIAL NEPHRECTOMY WITH LYSIS OF ADHESIONS X30 MINUTES.;  Surgeon: Ardis Hughs, MD;  Location: [facility];  Service: Urology;  Laterality: Left;  CLAMP TIME FOR KIDNEY 1053-1110 TOTAL 18MINUTES  . Rotator cuff surgery Right   . SKIN BIOPSY Right 10/31/2017   Chest  . TRANSURETHRAL RESECTION OF BLADDER TUMOR N/A 11/03/2017   Procedure: cystoscopy;  Surgeon: Kathie Rhodes, MD;  Location: [facility];  Service: Urology;  Laterality: N/A;  . UMBILICAL HERNIA REPAIR    . UPPER GI ENDOSCOPY    . VENTRAL HERNIA REPAIR N/A 03/02/2018   Procedure: LAPAROSCOPIC REPAIR OF INCARCERATED INCISIONAL HERNIA WITH LYSIS OF ADHESIONS;  Surgeon: Michael Boston, MD;  Location: [facility];  Service: General;  Laterality: N/A;    REVIEW OF SYSTEMS:   Review of Systems  Constitutional: Positive for fatigue. Negative for appetite change, chills, fever and unexpected weight change.  HENT:   Negative for mouth sores, nosebleeds, sore throat and trouble swallowing.   Eyes: Negative for eye problems and icterus.  Respiratory: Negative for cough, hemoptysis, shortness of breath and wheezing.   Cardiovascular: Negative for chest pain and leg swelling.  Gastrointestinal: Positive for occasional constipation. Negative for abdominal pain, diarrhea, nausea and vomiting.  Genitourinary: Negative for bladder incontinence, difficulty urinating, dysuria, frequency and hematuria.   Musculoskeletal: Negative for back pain, gait problem, neck pain and neck stiffness.  Skin: Negative for itching and rash.  Neurological: Negative for dizziness, extremity weakness, gait problem, headaches, light-headedness and seizures.  Hematological: Negative for adenopathy. Does not bruise/bleed easily.  Psychiatric/Behavioral: Negative for confusion, depression and sleep  disturbance. The patient is not  nervous/anxious.     PHYSICAL EXAMINATION:  Blood pressure 130/76, pulse 76, temperature 97.8 F (36.6 C), temperature source Oral, resp. rate 18, height _0  (1.702 m), weight 204 lb 3.2 oz (92.6 kg), SpO2 99 %.  ECOG PERFORMANCE STATUS: 1 - Symptomatic but completely ambulatory  Physical Exam  Constitutional: Oriented to person, place, and time and well-developed, well-nourished, and in no distress.  HENT:  Head: Normocephalic and atraumatic.  Mouth/Throat: Oropharynx is clear and moist. No oropharyngeal exudate.  Eyes: Conjunctivae are normal. Right eye exhibits no discharge. Left eye exhibits no discharge. No scleral icterus.  Neck: Normal range of motion. Neck supple.  Cardiovascular: Normal rate, regular rhythm, normal heart sounds and intact distal pulses.   Pulmonary/Chest: Effort normal and breath sounds normal. No respiratory distress. No wheezes. No rales.  Abdominal: Soft. Bowel sounds are normal. Exhibits no distension and no mass. There is no tenderness.  Musculoskeletal: Normal range of motion. Exhibits no edema.  Lymphadenopathy:    No cervical adenopathy.  Neurological: Alert and oriented to person, place, and time. Exhibits normal muscle tone. Gait normal. Coordination normal.  Skin: Skin is warm and dry. No rash noted. Not diaphoretic. No erythema. No pallor.  Psychiatric: Mood, memory and judgment normal.  Vitals reviewed.  LABORATORY DATA: Lab Results  Component Value Date   WBC 3.7 (L) 10/24/2018   HGB 13.5 10/24/2018   HCT 39.0 10/24/2018   MCV 90.1 10/24/2018   PLT 130 (L) 10/24/2018      Chemistry      Component Value Date/Time   NA 128 (L) 10/24/2018 1446   K 4.3 10/24/2018 1446   CL 96 (L) 10/24/2018 1446   CO2 23 10/24/2018 1446   BUN 29 (H) 10/24/2018 1446   CREATININE 1.24 10/24/2018 1446   CREATININE 1.18 08/28/2018 1055      Component Value Date/Time   CALCIUM 8.6 (L) 10/24/2018 1446   ALKPHOS 85  10/24/2018 1446   AST 31 10/24/2018 1446   ALT 34 10/24/2018 1446   BILITOT 0.3 10/24/2018 1446       RADIOGRAPHIC STUDIES:  Mr Jeri Cos NI Contrast  Result Date: 10/04/2018 CLINICAL DATA:  Recently diagnosed non-small cell lung cancer. Staging. EXAM: MRI HEAD WITHOUT AND WITH CONTRAST TECHNIQUE: Multiplanar, multiecho pulse sequences of the brain and surrounding structures were obtained without and with intravenous contrast. CONTRAST:  50m MULTIHANCE GADOBENATE DIMEGLUMINE 529 MG/ML IV SOLN COMPARISON:  Head CT 06/09/2007 FINDINGS: Brain: There is no evidence of acute infarct, intracranial hemorrhage, mass, midline shift, or extra-axial fluid collection. Mild cerebral atrophy is within normal limits for age. Small foci of T2 hyperintensity scattered throughout the cerebral white matter bilaterally are nonspecific but compatible with mild chronic small vessel ischemic disease. No abnormal enhancement is identified. Vascular: Major intracranial vascular flow voids are preserved. Skull and upper cervical spine: Unremarkable bone marrow signal. Sinuses/Orbits: Bilateral cataract extraction. At most minimal mucosal thickening in the paranasal sinuses and at most trace right mastoid fluid. Other: None. IMPRESSION: 1. No evidence of intracranial metastases. 2. Mild chronic small vessel ischemic disease. Electronically Signed   By: ALogan BoresM.D.   On: 10/04/2018 13:58     ASSESSMENT/PLAN:  This is a very pleasant 73year old Caucasian male recently diagnosed with non-small cell lung cancer, adenocarcinoma.  He presented with a left upper lobe lung nodule in addition to metastatic bone disease.  He was diagnosed in April 2020.  His PDL 1 expression is negative and he has no actionable mutations.  The patient also has a history of renal cell carcinoma.  He is status post a wedge resection of the left kidney in September 2019.  The patient is currently undergoing palliative systemic chemotherapy with  carboplatin for an AUC of 5, Alimta 500 mg/m, and Keytruda 200 mg IV every 3 weeks.  The patient is status post his first cycle of treatment.  He tolerated it well without any adverse effects except for mild constipation and fatigue   The patient was seen with Dr. Julien Nordmann today.  Labs were reviewed with the patient. The patient's sodium continues to run low. This patient is aware of this and has had this for about 5 years. He states this is followed by his PCP. We will continue to monitor his labs closely every week.   We will see him back in a follow-up visit for 2 weeks for evaluation prior to starting cycle #2 of his treatment.  We will continue taking his folic acid 1 mg p.o. daily.  He is getting a port-a-cath placed on Monday. I have sent a prescription for the EMLA cream to his pharmacy.  The patient was advised to call immediately if he has any concerning symptoms in the interval. The patient voices understanding of current disease status and treatment options and is in agreement with the current care plan. All questions were answered. The patient knows to call the clinic with any problems, questions or concerns. We can certainly see the patient much sooner if necessary  No orders of the defined types were placed in this encounter.    Deray Dawes L Teona Vargus, PA-C 10/24/18  ADDENDUM: Hematology/Oncology Attending: I had a face-to-face encounter with the patient today.  I recommended his care plan.  This is a very pleasant 73 years old white male with metastatic non-small cell lung cancer syndrome and he is no actionable suppression.  The patient is currently undergoing systemic chemotherapy with carboplatin, Alimta and Ketruda status post 1 cycle.  The patient tolerated the first with no concerning adverse effect except for one episode of nausea and diarrhea. He is expected to come back for follow-up visit in 2 weeks for evaluation before starting cycle #2. He was advised to call  immediately if he has any concerning symptoms in the interval.  Disclaimer: This note was dictated with voice recognition software. Similar sounding words can inadvertently be transcribed and may be missed upon review. Eilleen Kempf, MD 10/24/18

## 2018-10-25 ENCOUNTER — Telehealth: Payer: Self-pay | Admitting: Physician Assistant

## 2018-10-25 NOTE — Telephone Encounter (Signed)
Scheduled appt per 5/13 los - added additional cycles to appts already on schedule. Pt to get an updated schedule next visit

## 2018-10-26 ENCOUNTER — Telehealth: Payer: Self-pay | Admitting: *Deleted

## 2018-10-26 ENCOUNTER — Other Ambulatory Visit: Payer: Self-pay | Admitting: Student

## 2018-10-26 NOTE — Telephone Encounter (Signed)
Received call from patient. He states he has developed a rash on his face, head, neck and back this week. He most recent treatment was on 10/17/18 with Keytruda, Alimta and Carboplatin. This was his first treatment. He also informed me that he took meloxicam for 3 days in a row for "athritis pain " in his lower back. It was after that when he developed the current rash.  He states he had a the same reaction to the meloxicam previously. He has tried hydrocortisone cream without much relief. He states he used benadryl last time for the reaction to the meloxicam and it cleared up quickly. Advised pt to try the benadryl again, making sure he does not drive or use machinery as he states it makes him very sleepy.  Also advised not to use meloxicam again as he seems to be allergic to it.  Suggested he call his PCP regarding relief of arthritis pain issues. Advised pt to call back if the rash does not calm down.  Pt voiced understanding and stated he would call back if no relief from benadryl. Meloxicam added to pt's allergy list

## 2018-10-29 ENCOUNTER — Other Ambulatory Visit: Payer: Self-pay | Admitting: Internal Medicine

## 2018-10-29 ENCOUNTER — Ambulatory Visit (HOSPITAL_COMMUNITY)
Admission: RE | Admit: 2018-10-29 | Discharge: 2018-10-29 | Disposition: A | Discharge status: Home / Self Care | Payer: Medicare Other | Source: Ambulatory Visit | Attending: Internal Medicine | Admitting: Internal Medicine

## 2018-10-29 ENCOUNTER — Encounter (HOSPITAL_COMMUNITY): Payer: Self-pay

## 2018-10-29 ENCOUNTER — Other Ambulatory Visit: Payer: Self-pay

## 2018-10-29 ENCOUNTER — Other Ambulatory Visit (HOSPITAL_COMMUNITY): Payer: Self-pay | Admitting: Vascular Surgery

## 2018-10-29 DIAGNOSIS — I10 Essential (primary) hypertension: Secondary | ICD-10-CM | POA: Diagnosis not present

## 2018-10-29 DIAGNOSIS — C3490 Malignant neoplasm of unspecified part of unspecified bronchus or lung: Secondary | ICD-10-CM | POA: Diagnosis not present

## 2018-10-29 DIAGNOSIS — C3492 Malignant neoplasm of unspecified part of left bronchus or lung: Secondary | ICD-10-CM | POA: Insufficient documentation

## 2018-10-29 DIAGNOSIS — Z85528 Personal history of other malignant neoplasm of kidney: Secondary | ICD-10-CM | POA: Diagnosis not present

## 2018-10-29 DIAGNOSIS — I878 Other specified disorders of veins: Secondary | ICD-10-CM

## 2018-10-29 DIAGNOSIS — E119 Type 2 diabetes mellitus without complications: Secondary | ICD-10-CM | POA: Insufficient documentation

## 2018-10-29 DIAGNOSIS — E785 Hyperlipidemia, unspecified: Secondary | ICD-10-CM | POA: Insufficient documentation

## 2018-10-29 DIAGNOSIS — J449 Chronic obstructive pulmonary disease, unspecified: Secondary | ICD-10-CM | POA: Insufficient documentation

## 2018-10-29 DIAGNOSIS — F329 Major depressive disorder, single episode, unspecified: Secondary | ICD-10-CM | POA: Insufficient documentation

## 2018-10-29 DIAGNOSIS — F419 Anxiety disorder, unspecified: Secondary | ICD-10-CM | POA: Diagnosis not present

## 2018-10-29 DIAGNOSIS — C7951 Secondary malignant neoplasm of bone: Secondary | ICD-10-CM | POA: Insufficient documentation

## 2018-10-29 DIAGNOSIS — Z452 Encounter for adjustment and management of vascular access device: Secondary | ICD-10-CM | POA: Diagnosis not present

## 2018-10-29 DIAGNOSIS — Z79899 Other long term (current) drug therapy: Secondary | ICD-10-CM | POA: Insufficient documentation

## 2018-10-29 DIAGNOSIS — K219 Gastro-esophageal reflux disease without esophagitis: Secondary | ICD-10-CM | POA: Diagnosis not present

## 2018-10-29 DIAGNOSIS — G4733 Obstructive sleep apnea (adult) (pediatric): Secondary | ICD-10-CM | POA: Diagnosis not present

## 2018-10-29 DIAGNOSIS — I25119 Atherosclerotic heart disease of native coronary artery with unspecified angina pectoris: Secondary | ICD-10-CM | POA: Insufficient documentation

## 2018-10-29 HISTORY — PX: IR IMAGING GUIDED PORT INSERTION: IMG5740

## 2018-10-29 HISTORY — DX: Angina pectoris, unspecified: I20.9

## 2018-10-29 LAB — CBC
HCT: 40.7 % (ref 39.0–52.0)
Hemoglobin: 13.8 g/dL (ref 13.0–17.0)
MCH: 31.8 pg (ref 26.0–34.0)
MCHC: 33.9 g/dL (ref 30.0–36.0)
MCV: 93.8 fL (ref 80.0–100.0)
Platelets: 161 10*3/uL (ref 150–400)
RBC: 4.34 MIL/uL (ref 4.22–5.81)
RDW: 13.7 % (ref 11.5–15.5)
WBC: 5.2 10*3/uL (ref 4.0–10.5)
nRBC: 0 % (ref 0.0–0.2)

## 2018-10-29 LAB — PROTIME-INR
INR: 1.1 (ref 0.8–1.2)
Prothrombin Time: 14.4 seconds (ref 11.4–15.2)

## 2018-10-29 LAB — APTT: aPTT: 31 seconds (ref 24–36)

## 2018-10-29 LAB — GLUCOSE, CAPILLARY: Glucose-Capillary: 92 mg/dL (ref 70–99)

## 2018-10-29 MED ORDER — HEPARIN SOD (PORK) LOCK FLUSH 100 UNIT/ML IV SOLN
INTRAVENOUS | Status: AC
  Filled 2018-10-29: qty 5

## 2018-10-29 MED ORDER — VANCOMYCIN HCL IN DEXTROSE 1-5 GM/200ML-% IV SOLN
INTRAVENOUS | Status: AC
  Filled 2018-10-29: qty 200

## 2018-10-29 MED ORDER — LIDOCAINE-EPINEPHRINE 2 %-1:100000 IJ SOLN
INTRAMUSCULAR | Status: AC | PRN
  Administered 2018-10-29 (×2): 10 mL

## 2018-10-29 MED ORDER — HEPARIN SOD (PORK) LOCK FLUSH 100 UNIT/ML IV SOLN
INTRAVENOUS | Status: AC | PRN
  Administered 2018-10-29: 500 [IU] via INTRAVENOUS

## 2018-10-29 MED ORDER — LIDOCAINE-EPINEPHRINE 1 %-1:100000 IJ SOLN
INTRAMUSCULAR | Status: AC
  Filled 2018-10-29: qty 1

## 2018-10-29 MED ORDER — SODIUM CHLORIDE 0.9 % IV SOLN
INTRAVENOUS | Status: DC
  Administered 2018-10-29: 14:00:00 via INTRAVENOUS

## 2018-10-29 MED ORDER — FENTANYL CITRATE (PF) 100 MCG/2ML IJ SOLN
INTRAMUSCULAR | Status: AC | PRN
  Administered 2018-10-29 (×2): 50 ug via INTRAVENOUS

## 2018-10-29 MED ORDER — FENTANYL CITRATE (PF) 100 MCG/2ML IJ SOLN
INTRAMUSCULAR | Status: AC
  Filled 2018-10-29: qty 2

## 2018-10-29 MED ORDER — MIDAZOLAM HCL 2 MG/2ML IJ SOLN
INTRAMUSCULAR | Status: AC | PRN
  Administered 2018-10-29 (×2): 1 mg via INTRAVENOUS

## 2018-10-29 MED ORDER — VANCOMYCIN HCL IN DEXTROSE 1-5 GM/200ML-% IV SOLN
1000.0000 mg | Freq: Once | INTRAVENOUS | Status: AC
  Administered 2018-10-29: 1000 mg via INTRAVENOUS

## 2018-10-29 MED ORDER — MIDAZOLAM HCL 2 MG/2ML IJ SOLN
INTRAMUSCULAR | Status: AC
  Filled 2018-10-29: qty 4

## 2018-10-29 NOTE — Discharge Instructions (Signed)
Implanted Port Home Guide °An implanted port is a device that is placed under the skin. It is usually placed in the chest. The device can be used to give IV medicine, to take blood, or for dialysis. You may have an implanted port if: °· You need IV medicine that would be irritating to the small veins in your hands or arms. °· You need IV medicines, such as antibiotics, for a long period of time. °· You need IV nutrition for a long period of time. °· You need dialysis. °Having a port means that your health care provider will not need to use the veins in your arms for these procedures. You may have fewer limitations when using a port than you would if you used other types of long-term IVs, and you will likely be able to return to normal activities after your incision heals. °An implanted port has two main parts: °· Reservoir. The reservoir is the part where a needle is inserted to give medicines or draw blood. The reservoir is round. After it is placed, it appears as a small, raised area under your skin. °· Catheter. The catheter is a thin, flexible tube that connects the reservoir to a vein. Medicine that is inserted into the reservoir goes into the catheter and then into the vein. °How is my port accessed? °To access your port: °· A numbing cream may be placed on the skin over the port site. °· Your health care provider will put on a mask and sterile gloves. °· The skin over your port will be cleaned carefully with a germ-killing soap and allowed to dry. °· Your health care provider will gently pinch the port and insert a needle into it. °· Your health care provider will check for a blood return to make sure the port is in the vein and is not clogged. °· If your port needs to remain accessed to get medicine continuously (constant infusion), your health care provider will place a clear bandage (dressing) over the needle site. The dressing and needle will need to be changed every week, or as told by your health care  provider. °What is flushing? °Flushing helps keep the port from getting clogged. Follow instructions from your health care provider about how and when to flush the port. Ports are usually flushed with saline solution or a medicine called heparin. The need for flushing will depend on how the port is used: °· If the port is only used from time to time to give medicines or draw blood, the port may need to be flushed: °? Before and after medicines have been given. °? Before and after blood has been drawn. °? As part of routine maintenance. Flushing may be recommended every 4-6 weeks. °· If a constant infusion is running, the port may not need to be flushed. °· Throw away any syringes in a disposal container that is meant for sharp items (sharps container). You can buy a sharps container from a pharmacy, or you can make one by using an empty hard plastic bottle with a cover. °How long will my port stay implanted? °The port can stay in for as long as your health care provider thinks it is needed. When it is time for the port to come out, a surgery will be done to remove it. The surgery will be similar to the procedure that was done to put the port in. °Follow these instructions at home: ° °· Flush your port as told by your health care provider. °·   If you need an infusion over several days, follow instructions from your health care provider about how to take care of your port site. Make sure you: ? Wash your hands with soap and water before you change your dressing. If soap and water are not available, use alcohol-based hand sanitizer. ? Change your dressing as told by your health care provider. ? Place any used dressings or infusion bags into a plastic bag. Throw that bag in the trash. ? Keep the dressing that covers the needle clean and dry. Do not get it wet. ? Do not use scissors or sharp objects near the tube. ? Keep the tube clamped, unless it is being used.  Check your port site every day for signs of  infection. Check for: ? Redness, swelling, or pain. ? Fluid or blood. ? Pus or a bad smell.  Protect the skin around the port site. ? Avoid wearing bra straps that rub or irritate the site. ? Protect the skin around your port from seat belts. Place a soft pad over your chest if needed.  Bathe or shower as told by your health care provider. The site may get wet as long as you are not actively receiving an infusion.  Return to your normal activities as told by your health care provider. Ask your health care provider what activities are safe for you.  Carry a medical alert card or wear a medical alert bracelet at all times. This will let health care providers know that you have an implanted port in case of an emergency. Get help right away if:  You have redness, swelling, or pain at the port site.  You have fluid or blood coming from your port site.  You have pus or a bad smell coming from the port site.  You have a fever. Summary  Implanted ports are usually placed in the chest for long-term IV access.  Follow instructions from your health care provider about flushing the port and changing bandages (dressings).  Take care of the area around your port by avoiding clothing that puts pressure on the area, and by watching for signs of infection.  Protect the skin around your port from seat belts. Place a soft pad over your chest if needed.  Get help right away if you have a fever or you have redness, swelling, pain, drainage, or a bad smell at the port site. This information is not intended to replace advice given to you by your health care provider. Make sure you discuss any questions you have with your health care provider. Document Released: 05/30/2005 Document Revised: 07/02/2016 Document Reviewed: 07/02/2016 Elsevier Interactive Patient Education  2019 [place]. Moderate Conscious Sedation, Adult, Care After These instructions provide you with information about caring for  yourself after your procedure. Your health care provider may also give you more specific instructions. Your treatment has been planned according to current medical practices, but problems sometimes occur. Call your health care provider if you have any problems or questions after your procedure. What can I expect after the procedure? After your procedure, it is common:  To feel sleepy for several hours.  To feel clumsy and have poor balance for several hours.  To have poor judgment for several hours.  To vomit if you eat too soon. Follow these instructions at home: For at least 24 hours after the procedure:   Do not: ? Participate in activities where you could fall or become injured. ? Drive. ? Use heavy machinery. ? Drink  alcohol. ? Take sleeping pills or medicines that cause drowsiness. ? Make important decisions or sign legal documents. ? Take care of children on your own.  Rest. Eating and drinking  Follow the diet recommended by your health care provider.  If you vomit: ? Drink water, juice, or soup when you can drink without vomiting. ? Make sure you have little or no nausea before eating solid foods. General instructions  Have a responsible adult stay with you until you are awake and alert.  Take over-the-counter and prescription medicines only as told by your health care provider.  If you smoke, do not smoke without supervision.  Keep all follow-up visits as told by your health care provider. This is important. Contact a health care provider if:  You keep feeling nauseous or you keep vomiting.  You feel light-headed.  You develop a rash.  You have a fever. Get help right away if:  You have trouble breathing. This information is not intended to replace advice given to you by your health care provider. Make sure you discuss any questions you have with your health care provider. Document Released: 03/20/2013 Document Revised: 11/02/2015 Document Reviewed:  09/19/2015 Elsevier Interactive Patient Education  2019 [employer].

## 2018-10-29 NOTE — Procedures (Signed)
  Procedure: R IJ Port catheter placement   EBL:   minimal Complications:  none immediate  See full dictation in [facility].  Dillard Cannon MD Main # [phone] Pager  [phone]

## 2018-10-29 NOTE — H&P (Signed)
Referring Physician(s): Mohamed,Mohamed  Supervising Physician: Arne Cleveland  Patient Status:  [facility]  Chief Complaint: "I'm getting a port a cath"   Subjective: Patient familiar to IR service from prior left iliac bone lesion biopsy on 09/20/2018.  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 He has poor venous access and presents today for Port-A-Cath placement for palliative chemotherapy.  He denies fever, headache, chest pain, dyspnea, cough, abdominal/back pain, nausea, vomiting or bleeding.  Past Medical History:  Diagnosis Date  . Anginal pain ([place])   . Anxiety    treated /w Xanax for mild depression , using 2 times per day, not PRN  . Asthma   . Benign prostatic hypertrophy   . CAD (coronary artery disease)    pt last left heart cath was in Nov 2008. EF was 55% on left ventriculogram. Circumflex was totally occluded after the 1st obtuse marginal with collaterals supplying the distal circumflex. LAD showed luminal irregularities. The stent in LAD was patent. The RCA showed luminal irregularities. The stent in th emid RCA and PDA were patent. There were no inverventions.   . Chest pain    from anxiety last time 1 month ago  . 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 Chronic obstructive pulmonary disease (HCC)    asthma  . Cluster headache    relative to sinus problems none recnt  . Constipation   . Depression   . DM2 (diabetes mellitus, type 2) ([place])    well with diet and exercise controlled  . GERD (gastroesophageal reflux disease)    hiatal hernia. Patient did have a Nissen fundoplication rare  . Heart murmur    heard 30 yrs ago  . History of blood transfusion    need for Bld. transfusion relative to taking NSAIDS  . HTN (hypertension)    pt. followed by [facility],  last cardiac visit 2012, one yr. ago  . Hyperlipidemia   . Joint pain   . Lactose intolerance   . Left kidney mass   . Low back pain    chronic  . Metastatic non-small cell lung cancer (HCC)    adenocarcinoma, LUL, bone metastasis in 09/2018  . Neuromuscular disorder ([place])    nerve involvement in back & upper back relative to hardware in neck   . Obesity   . OSA and COPD overlap syndrome ([place]) 02/25/2015   no cpap used pt denies sleep apnea  . Osteoarthritis   . Peptic ulcer disease   . Pneumonia not recent per pt  . 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 Sleep concern    states the study (2003 )was done at [facility] it failed & he was told to return & he never did. Pt. states he has been found by prev. hosp. staff that he has to be told when to breathe & his wife does the same.   Marland Kitchen Spinal fracture    hx of traumatic in Jun 04, 2007 after falling off the roof  . Tinnitus, bilateral   . Vitamin D deficiency    Past Surgical History:  Procedure Laterality Date  . BACK SURGERY     x3- last surgery lumbar- 1998- / fusion   .  blepheroplasty     both eyesx2  . C5-T6 posterior fusion     with Oasis and radius screws  . [place], 2008 & 2012 multiple stents  total 4 times  . cataracts     cataracts removed- /w IOL - both eyes   . CERVICAL SPINE SURGERY    . COLONOSCOPY    . ESOPHAGEAL MANOMETRY    . HARDWARE REMOVAL  02/20/2012   Procedure: HARDWARE REMOVAL;  Surgeon: [name], MD;  [facility];  Service: Neurosurgery;  Laterality: N/A;  Hardware Removal  . HIATAL HERNIA REPAIR  1979, spring 2009  . LAPAROSCOPIC CHOLECYSTECTOMY     & IOC  . multiple percutaneous coronary interventions    . NASAL SINUS SURGERY     x2, sees Dr. [name], still having problems, states he uses Benadryl PRN- up to 5 times per day   . right temporal artery biopsy    . right wrist plate insertion  57/3220  . ROBOTIC  ASSITED PARTIAL NEPHRECTOMY Left 03/02/2018   Procedure: XI ROBOTIC ASSITED LEFT PARTIAL NEPHRECTOMY WITH LYSIS OF ADHESIONS X30 MINUTES.;  Surgeon: Ardis Hughs, MD;  Location: [facility];  Service: Urology;  Laterality: Left;  CLAMP TIME FOR KIDNEY 1053-1110 TOTAL 18MINUTES  . Rotator cuff surgery Right   . SKIN BIOPSY Right 10/31/2017   Chest  . TRANSURETHRAL RESECTION OF BLADDER TUMOR N/A 11/03/2017   Procedure: cystoscopy;  Surgeon: Kathie Rhodes, MD;  Location: [facility];  Service: Urology;  Laterality: N/A;  . UMBILICAL HERNIA REPAIR    . UPPER GI ENDOSCOPY    . VENTRAL HERNIA REPAIR N/A 03/02/2018   Procedure: LAPAROSCOPIC REPAIR OF INCARCERATED INCISIONAL HERNIA WITH LYSIS OF ADHESIONS;  Surgeon: Michael Boston, MD;  Location: [facility];  Service: General;  Laterality: N/A;      Allergies: Codeine; Meloxicam; Morphine and related; Nsaids; Oxycodone; Crestor [rosuvastatin]; Cymbalta [duloxetine hcl]; Dilaudid [hydromorphone hcl]; Effexor [venlafaxine]; Gluten meal; Topamax [topiramate]; Tramadol hcl; Trazodone and nefazodone; Adhesive [tape]; Penicillins; and Sulfa antibiotics  Medications: Prior to Admission medications   Medication Sig Start Date End Date Taking? Authorizing Provider  acetaminophen (TYLENOL) 500 MG tablet Take 1 tablet (500 mg total) by mouth every 6 (six) hours as needed. 02/28/17  Yes Beasley, Caren D, MD  Alpha-Lipoic Acid 200 MG CAPS Take by mouth 2 (two) times daily.   Yes [provider]  Ascorbic Acid (VITAMIN C) 1000 MG tablet Take 1,000 mg by mouth 2 (two) times daily.   Yes [provider]  bisoprolol-hydrochlorothiazide (ZIAC) 5-6.25 MG tablet Take 1 tablet by mouth daily. 08/20/18  Yes [name], MD  Cholecalciferol (VITAMIN D3) 5000 units CAPS Take 5,000 Units by mouth 2 (two) times daily.   Yes [provider]  CHOLINE PO Take 600 mg by mouth daily.   Yes [provider]  clopidogrel (PLAVIX) 75 MG tablet Take 1  tablet (75 mg total) by mouth daily after breakfast. 08/28/18  Yes Liane Comber, NP  DOXYLAMINE SUCCINATE PO Take 0.5 tablets by mouth at bedtime.    Yes [provider]  folic acid (FOLVITE) 1 MG tablet Take 1 tablet (1 mg total) by mouth daily. 10/09/18  Yes Curt Bears, MD  ipratropium (ATROVENT) 0.03 % nasal spray Place 1-2 sprays into both nostrils daily as needed (for allergies.). 05/24/18  Yes [name], MD  levofloxacin [facility]) 500 MG tablet Take 1 tablet daily with food for infection 10/12/18  Yes McKeown,  Gwyndolyn Saxon, MD  Magnesium 500 MG TABS Take 400 mg by mouth at bedtime. Take 455m QID   Yes [provider]  MELATONIN PO Take 30 mg by mouth at bedtime.   Yes [provider]  Multiple Vitamins-Minerals (MULTIVITAMIN ADULT PO) Take by mouth daily.   Yes [provider]  nitroGLYCERIN (NITROSTAT) 0.4 MG SL tablet Place 0.4 mg under the tongue every 5 (five) minutes x 3 doses as needed for chest pain.   Yes [provider]  SUPER B COMPLEX/C PO Take by mouth daily.   Yes [provider]  tamsulosin (FLOMAX) 0.4 MG CAPS capsule Take 1 capsule daily for Prostate 05/15/18 05/15/19 Yes MUnk Pinto MD  tetrahydrozoline-zinc (VISINE-AC) 0.05-0.25 % ophthalmic solution Place 1-2 drops into both eyes 3 (three) times daily as needed (for redness/irritation.).   Yes [provider]  Turmeric 500 MG TABS Take by mouth. Takes 3 in the morning and 3 in the evening   Yes [provider]  VALERIAN PO Take 400 mg by mouth daily.   Yes [provider]  Blood Glucose Monitoring Suppl (FREESTYLE FREEDOM LITE) w/Device KIT Check blood sugar 1 time a day. [id number]-E17.407/25/19   MUnk Pinto MD  diazepam (VALIUM) 5 MG tablet TAKE 1/2 TO 1 TABLET BY MOUTH TWICE DAILY ONLY IF NEEDED FOR ANXIETY ATTACK AND LIMIT TO 5 DAYS A WEEK TO AVOID ADDICTION 08/29/18   CLiane Comber NP  lidocaine-prilocaine (EMLA) cream Apply 1  application topically as needed. 10/24/18   Heilingoetter, Cassandra L, PA-C  prochlorperazine (COMPAZINE) 10 MG tablet Take 1 tablet (10 mg total) by mouth every 6 (six) hours as needed for nausea or vomiting. 10/09/18   MCurt Bears MD  triamcinolone ointment (KENALOG) 0.1 % Apply 1 application topically 2 (two) times daily. Patient not taking: Reported on 10/03/2018 03/26/18   CLiane Comber NP     Vital Signs: Blood pressure 119/77, heart rate 74, respirations 18, O2 sats 98% room air, temp 99.1   Physical Exam awake, alert.  Chest with distant breath sounds bilaterally.  Heart with regular rate and rhythm.  Abdomen soft, protuberant, positive bowel sounds, nontender.  Extremities with full range of motion, trace pretibial edema.  Imaging: No results found.  Labs:  CBC: Recent Labs    10/09/18 1312 10/17/18 0829 10/24/18 1446 10/29/18 1328  WBC 5.8 5.6 3.7* 5.2  HGB 15.4 14.9 13.5 13.8  HCT 44.2 43.7 39.0 40.7  PLT 166 162 130* 161    COAGS: Recent Labs    09/20/18 0925 10/29/18 1328  INR 1.1 1.1  APTT  --  31    BMP: Recent Labs    09/11/18 1355 10/09/18 1312 10/17/18 0829 10/24/18 1446  NA 131* 131* 131* 128*  K 4.0 4.5 4.1 4.3  CL 96* 97* 100 96*  CO2 23 24 21* 23  GLUCOSE 102* 102* 112* 94  BUN 28* 21 23 29*  CALCIUM 9.0 9.1 9.2 8.6*  CREATININE 1.17 1.11 1.13 1.24  GFRNONAA >60 >60 >60 57*  GFRAA >60 >60 >60 >60    LIVER FUNCTION TESTS: Recent Labs    09/11/18 1355 10/09/18 1312 10/17/18 0829 10/24/18 1446  BILITOT 0.4 0.4 0.4 0.3  AST 23 26 19 31   ALT 18 19 16  34  ALKPHOS 83 82 75 85  PROT 7.4 7.7 7.1 6.6  ALBUMIN 3.6 3.6 3.5 3.1*    Assessment and Plan: Pt with history of stage IV adenocarcinoma of the left lung with metastatic  disease to the bone diagnosed in April of this year as well as prior history of renal cell carcinoma with  wedge resection of the left kidney. He has poor venous access and presents today for Port-A-Cath  placement for palliative chemotherapy.Risks and benefits of image guided port-a-catheter placement was discussed with the patient including, but not limited to bleeding, infection, pneumothorax, or fibrin sheath development and need for additional procedures.  All of the patient's questions were answered, patient is agreeable to proceed. Consent signed and in chart.     Electronically Signed: D. Rowe Robert, PA-C 10/29/2018, 2:08 PM   I spent a total of 25 minutes at the the patient's bedside AND on the patient's hospital floor or unit, greater than 50% of which was counseling/coordinating care for port a cath placement

## 2018-10-31 ENCOUNTER — Inpatient Hospital Stay: Payer: Medicare Other

## 2018-10-31 ENCOUNTER — Other Ambulatory Visit: Payer: Self-pay

## 2018-10-31 DIAGNOSIS — Z79899 Other long term (current) drug therapy: Secondary | ICD-10-CM | POA: Diagnosis not present

## 2018-10-31 DIAGNOSIS — C3412 Malignant neoplasm of upper lobe, left bronchus or lung: Secondary | ICD-10-CM | POA: Diagnosis not present

## 2018-10-31 DIAGNOSIS — Z5111 Encounter for antineoplastic chemotherapy: Secondary | ICD-10-CM | POA: Diagnosis not present

## 2018-10-31 DIAGNOSIS — Z5112 Encounter for antineoplastic immunotherapy: Secondary | ICD-10-CM | POA: Diagnosis not present

## 2018-10-31 DIAGNOSIS — C7951 Secondary malignant neoplasm of bone: Secondary | ICD-10-CM | POA: Diagnosis not present

## 2018-10-31 DIAGNOSIS — C3492 Malignant neoplasm of unspecified part of left bronchus or lung: Secondary | ICD-10-CM

## 2018-10-31 LAB — CBC WITH DIFFERENTIAL (CANCER CENTER ONLY)
Abs Immature Granulocytes: 0.02 10*3/uL (ref 0.00–0.07)
Basophils Absolute: 0 10*3/uL (ref 0.0–0.1)
Basophils Relative: 1 %
Eosinophils Absolute: 0.2 10*3/uL (ref 0.0–0.5)
Eosinophils Relative: 4 %
HCT: 40.2 % (ref 39.0–52.0)
Hemoglobin: 13.5 g/dL (ref 13.0–17.0)
Immature Granulocytes: 0 %
Lymphocytes Relative: 24 %
Lymphs Abs: 1.2 10*3/uL (ref 0.7–4.0)
MCH: 30.9 pg (ref 26.0–34.0)
MCHC: 33.6 g/dL (ref 30.0–36.0)
MCV: 92 fL (ref 80.0–100.0)
Monocytes Absolute: 0.6 10*3/uL (ref 0.1–1.0)
Monocytes Relative: 13 %
Neutro Abs: 2.8 10*3/uL (ref 1.7–7.7)
Neutrophils Relative %: 58 %
Platelet Count: 152 10*3/uL (ref 150–400)
RBC: 4.37 MIL/uL (ref 4.22–5.81)
RDW: 14 % (ref 11.5–15.5)
WBC Count: 4.8 10*3/uL (ref 4.0–10.5)
nRBC: 0 % (ref 0.0–0.2)

## 2018-10-31 LAB — CMP (CANCER CENTER ONLY)
ALT: 35 U/L (ref 0–44)
AST: 27 U/L (ref 15–41)
Albumin: 3.2 g/dL — ABNORMAL LOW (ref 3.5–5.0)
Alkaline Phosphatase: 91 U/L (ref 38–126)
Anion gap: 10 (ref 5–15)
BUN: 23 mg/dL (ref 8–23)
CO2: 23 mmol/L (ref 22–32)
Calcium: 8.9 mg/dL (ref 8.9–10.3)
Chloride: 102 mmol/L (ref 98–111)
Creatinine: 1.06 mg/dL (ref 0.61–1.24)
GFR, Est AFR Am: >60 mL/min (ref >60)
GFR, Estimated: >60 mL/min (ref >60)
Glucose, Bld: 103 mg/dL — ABNORMAL HIGH (ref 70–99)
Potassium: 4.3 mmol/L (ref 3.5–5.1)
Sodium: 135 mmol/L (ref 135–145)
Total Bilirubin: <0.2 mg/dL — ABNORMAL LOW (ref 0.3–1.2)
Total Protein: 6.8 g/dL (ref 6.5–8.1)

## 2018-11-08 ENCOUNTER — Encounter: Payer: Self-pay | Admitting: Physician Assistant

## 2018-11-08 ENCOUNTER — Inpatient Hospital Stay: Payer: Medicare Other

## 2018-11-08 ENCOUNTER — Inpatient Hospital Stay (HOSPITAL_BASED_OUTPATIENT_CLINIC_OR_DEPARTMENT_OTHER): Payer: Medicare Other | Admitting: Physician Assistant

## 2018-11-08 ENCOUNTER — Other Ambulatory Visit: Payer: Self-pay

## 2018-11-08 VITALS — BP 127/73 | HR 71 | Temp 98.7°F | Resp 18 | Ht 67.0 in | Wt 201.7 lb

## 2018-11-08 DIAGNOSIS — R0609 Other forms of dyspnea: Secondary | ICD-10-CM

## 2018-11-08 DIAGNOSIS — R5383 Other fatigue: Secondary | ICD-10-CM | POA: Diagnosis not present

## 2018-11-08 DIAGNOSIS — Z5111 Encounter for antineoplastic chemotherapy: Secondary | ICD-10-CM | POA: Diagnosis not present

## 2018-11-08 DIAGNOSIS — Z79899 Other long term (current) drug therapy: Secondary | ICD-10-CM | POA: Diagnosis not present

## 2018-11-08 DIAGNOSIS — K5900 Constipation, unspecified: Secondary | ICD-10-CM

## 2018-11-08 DIAGNOSIS — Z5112 Encounter for antineoplastic immunotherapy: Secondary | ICD-10-CM

## 2018-11-08 DIAGNOSIS — R5382 Chronic fatigue, unspecified: Secondary | ICD-10-CM

## 2018-11-08 DIAGNOSIS — C7951 Secondary malignant neoplasm of bone: Secondary | ICD-10-CM | POA: Diagnosis not present

## 2018-11-08 DIAGNOSIS — C3492 Malignant neoplasm of unspecified part of left bronchus or lung: Secondary | ICD-10-CM

## 2018-11-08 DIAGNOSIS — Z85528 Personal history of other malignant neoplasm of kidney: Secondary | ICD-10-CM | POA: Diagnosis not present

## 2018-11-08 DIAGNOSIS — C3412 Malignant neoplasm of upper lobe, left bronchus or lung: Secondary | ICD-10-CM

## 2018-11-08 LAB — CBC WITH DIFFERENTIAL (CANCER CENTER ONLY)
Abs Immature Granulocytes: 0.03 10*3/uL (ref 0.00–0.07)
Basophils Absolute: 0 10*3/uL (ref 0.0–0.1)
Basophils Relative: 1 %
Eosinophils Absolute: 0.1 10*3/uL (ref 0.0–0.5)
Eosinophils Relative: 4 %
HCT: 41.7 % (ref 39.0–52.0)
Hemoglobin: 14.4 g/dL (ref 13.0–17.0)
Immature Granulocytes: 1 %
Lymphocytes Relative: 33 %
Lymphs Abs: 1.3 10*3/uL (ref 0.7–4.0)
MCH: 31.2 pg (ref 26.0–34.0)
MCHC: 34.5 g/dL (ref 30.0–36.0)
MCV: 90.5 fL (ref 80.0–100.0)
Monocytes Absolute: 0.5 10*3/uL (ref 0.1–1.0)
Monocytes Relative: 12 %
Neutro Abs: 2 10*3/uL (ref 1.7–7.7)
Neutrophils Relative %: 49 %
Platelet Count: 139 10*3/uL — ABNORMAL LOW (ref 150–400)
RBC: 4.61 MIL/uL (ref 4.22–5.81)
RDW: 14.1 % (ref 11.5–15.5)
WBC Count: 4 10*3/uL (ref 4.0–10.5)
nRBC: 0 % (ref 0.0–0.2)

## 2018-11-08 LAB — CMP (CANCER CENTER ONLY)
ALT: 34 U/L (ref 0–44)
AST: 30 U/L (ref 15–41)
Albumin: 3.3 g/dL — ABNORMAL LOW (ref 3.5–5.0)
Alkaline Phosphatase: 69 U/L (ref 38–126)
Anion gap: 10 (ref 5–15)
BUN: 22 mg/dL (ref 8–23)
CO2: 22 mmol/L (ref 22–32)
Calcium: 8.8 mg/dL — ABNORMAL LOW (ref 8.9–10.3)
Chloride: 99 mmol/L (ref 98–111)
Creatinine: 1.12 mg/dL (ref 0.61–1.24)
GFR, Est AFR Am: >60 mL/min (ref >60)
GFR, Estimated: >60 mL/min (ref >60)
Glucose, Bld: 122 mg/dL — ABNORMAL HIGH (ref 70–99)
Potassium: 4.1 mmol/L (ref 3.5–5.1)
Sodium: 131 mmol/L — ABNORMAL LOW (ref 135–145)
Total Bilirubin: 0.3 mg/dL (ref 0.3–1.2)
Total Protein: 7 g/dL (ref 6.5–8.1)

## 2018-11-08 LAB — TSH: TSH: 1.004 u[IU]/mL (ref 0.320–4.118)

## 2018-11-08 MED ORDER — SODIUM CHLORIDE 0.9 % IV SOLN
485.0000 mg/m2 | Freq: Once | INTRAVENOUS | Status: AC
  Administered 2018-11-08: 1000 mg via INTRAVENOUS
  Filled 2018-11-08: qty 40

## 2018-11-08 MED ORDER — SODIUM CHLORIDE 0.9 % IV SOLN
Freq: Once | INTRAVENOUS | Status: AC
  Administered 2018-11-08: 11:00:00 via INTRAVENOUS
  Filled 2018-11-08: qty 5

## 2018-11-08 MED ORDER — SODIUM CHLORIDE 0.9% FLUSH
10.0000 mL | INTRAVENOUS | Status: DC | PRN
  Administered 2018-11-08: 10 mL
  Filled 2018-11-08: qty 10

## 2018-11-08 MED ORDER — SODIUM CHLORIDE 0.9 % IV SOLN
Freq: Once | INTRAVENOUS | Status: AC
  Administered 2018-11-08: 11:00:00 via INTRAVENOUS
  Filled 2018-11-08: qty 250

## 2018-11-08 MED ORDER — SODIUM CHLORIDE 0.9 % IV SOLN
200.0000 mg | Freq: Once | INTRAVENOUS | Status: AC
  Administered 2018-11-08: 200 mg via INTRAVENOUS
  Filled 2018-11-08: qty 8

## 2018-11-08 MED ORDER — PALONOSETRON HCL INJECTION 0.25 MG/5ML
0.2500 mg | Freq: Once | INTRAVENOUS | Status: AC
  Administered 2018-11-08: 0.25 mg via INTRAVENOUS

## 2018-11-08 MED ORDER — PALONOSETRON HCL INJECTION 0.25 MG/5ML
INTRAVENOUS | Status: AC
  Filled 2018-11-08: qty 5

## 2018-11-08 MED ORDER — SODIUM CHLORIDE 0.9 % IV SOLN
500.0000 mg | Freq: Once | INTRAVENOUS | Status: AC
  Administered 2018-11-08: 500 mg via INTRAVENOUS
  Filled 2018-11-08: qty 50

## 2018-11-08 MED ORDER — HEPARIN SOD (PORK) LOCK FLUSH 100 UNIT/ML IV SOLN
500.0000 [IU] | Freq: Once | INTRAVENOUS | Status: AC | PRN
  Administered 2018-11-08: 500 [IU]
  Filled 2018-11-08: qty 5

## 2018-11-08 NOTE — Patient Instructions (Signed)
[facility] Discharge Instructions for Patients Receiving Chemotherapy  Today you received the following chemotherapy agents: Keytruda, Alimta, and Carboplatin.  To help prevent nausea and vomiting after your treatment, we encourage you to take your nausea medication as prescribed.   If you develop nausea and vomiting that is not controlled by your nausea medication, call the clinic.   BELOW ARE SYMPTOMS THAT SHOULD BE REPORTED IMMEDIATELY:  *FEVER GREATER THAN 100.5 F  *CHILLS WITH OR WITHOUT FEVER  NAUSEA AND VOMITING THAT IS NOT CONTROLLED WITH YOUR NAUSEA MEDICATION  *UNUSUAL SHORTNESS OF BREATH  *UNUSUAL BRUISING OR BLEEDING  TENDERNESS IN MOUTH AND THROAT WITH OR WITHOUT PRESENCE OF ULCERS  *URINARY PROBLEMS  *BOWEL PROBLEMS  UNUSUAL RASH Items with * indicate a potential emergency and should be followed up as soon as possible.  Feel free to call the clinic should you have any questions or concerns. The clinic phone number is [phone].  Please show the [facility] at check-in to the Emergency Department and triage nurse.   Pembrolizumab injection Beryle Flock) What is this medicine? PEMBROLIZUMAB (pem broe liz ue mab) is a monoclonal antibody. It is used to treat cervical cancer, esophageal cancer, head and neck cancer, hepatocellular cancer, Hodgkin lymphoma, kidney cancer, lymphoma, melanoma, Merkel cell carcinoma, lung cancer, stomach cancer, urothelial cancer, and cancers that have a certain genetic condition. This medicine may be used for other purposes; ask your health care provider or pharmacist if you have questions. COMMON BRAND NAME(S): Keytruda What should I tell my health care provider before I take this medicine? They need to know if you have any of these conditions: -diabetes -immune system problems -inflammatory bowel disease -liver disease -lung or breathing disease -lupus -received or scheduled to receive an organ transplant  or a stem-cell transplant that uses donor stem cells -an unusual or allergic reaction to pembrolizumab, other medicines, foods, dyes, or preservatives -pregnant or trying to get pregnant -breast-feeding How should I use this medicine? This medicine is for infusion into a vein. It is given by a health care professional in a hospital or clinic setting. A special MedGuide will be given to you before each treatment. Be sure to read this information carefully each time. Talk to your pediatrician regarding the use of this medicine in children. While this drug may be prescribed for selected conditions, precautions do apply. Overdosage: If you think you have taken too much of this medicine contact a poison control center or emergency room at once. NOTE: This medicine is only for you. Do not share this medicine with others. What if I miss a dose? It is important not to miss your dose. Call your doctor or health care professional if you are unable to keep an appointment. What may interact with this medicine? Interactions have not been studied. Give your health care provider a list of all the medicines, herbs, non-prescription drugs, or dietary supplements you use. Also tell them if you smoke, drink alcohol, or use illegal drugs. Some items may interact with your medicine. This list may not describe all possible interactions. Give your health care provider a list of all the medicines, herbs, non-prescription drugs, or dietary supplements you use. Also tell them if you smoke, drink alcohol, or use illegal drugs. Some items may interact with your medicine. What should I watch for while using this medicine? Your condition will be monitored carefully while you are receiving this medicine. You may need blood work done while you are taking this medicine.  Do not become pregnant while taking this medicine or for 4 months after stopping it. Women should inform their doctor if they wish to become pregnant or think  they might be pregnant. There is a potential for serious side effects to an unborn child. Talk to your health care professional or pharmacist for more information. Do not breast-feed an infant while taking this medicine or for 4 months after the last dose. What side effects may I notice from receiving this medicine? Side effects that you should report to your doctor or health care professional as soon as possible: -allergic reactions like skin rash, itching or hives, swelling of the face, lips, or tongue -bloody or black, tarry -breathing problems -changes in vision -chest pain -chills -confusion -constipation -cough -diarrhea -dizziness or feeling faint or lightheaded -fast or irregular heartbeat -fever -flushing -hair loss -joint pain -low blood counts - this medicine may decrease the number of white blood cells, red blood cells and platelets. You may be at increased risk for infections and bleeding. -muscle pain -muscle weakness -persistent headache -redness, blistering, peeling or loosening of the skin, including inside the mouth -signs and symptoms of high blood sugar such as dizziness; dry mouth; dry skin; fruity breath; nausea; stomach pain; increased hunger or thirst; increased urination -signs and symptoms of kidney injury like trouble passing urine or change in the amount of urine -signs and symptoms of liver injury like dark urine, light-colored stools, loss of appetite, nausea, right upper belly pain, yellowing of the eyes or skin -sweating -swollen lymph nodes -weight loss Side effects that usually do not require medical attention (report to your doctor or health care professional if they continue or are bothersome): -decreased appetite -muscle pain -tiredness This list may not describe all possible side effects. Call your doctor for medical advice about side effects. You may report side effects to FDA at 1-800-FDA-1088. Where should I keep my medicine? This drug is  given in a hospital or clinic and will not be stored at home. NOTE: This sheet is a summary. It may not cover all possible information. If you have questions about this medicine, talk to your doctor, pharmacist, or health care provider.  2019 Elsevier/Gold Standard (2018-01-11 15:06:10)  Pemetrexed injection (Alimta) What is this medicine? PEMETREXED (PEM e TREX ed) is a chemotherapy drug used to treat lung cancers like non-small cell lung cancer and mesothelioma. It may also be used to treat other cancers. This medicine may be used for other purposes; ask your health care provider or pharmacist if you have questions. COMMON BRAND NAME(S): Alimta What should I tell my health care provider before I take this medicine? They need to know if you have any of these conditions: -infection (especially a virus infection such as chickenpox, cold sores, or herpes) -kidney disease -low blood counts, like low white cell, platelet, or red cell counts -lung or breathing disease, like asthma -radiation therapy -an unusual or allergic reaction to pemetrexed, other medicines, foods, dyes, or preservative -pregnant or trying to get pregnant -breast-feeding How should I use this medicine? This drug is given as an infusion into a vein. It is administered in a hospital or clinic by a specially trained health care professional. Talk to your pediatrician regarding the use of this medicine in children. Special care may be needed. Overdosage: If you think you have taken too much of this medicine contact a poison control center or emergency room at once. NOTE: This medicine is only for you. Do not share this  medicine with others. What if I miss a dose? It is important not to miss your dose. Call your doctor or health care professional if you are unable to keep an appointment. What may interact with this medicine? This medicine may interact with the following medications: -Ibuprofen This list may not describe all  possible interactions. Give your health care provider a list of all the medicines, herbs, non-prescription drugs, or dietary supplements you use. Also tell them if you smoke, drink alcohol, or use illegal drugs. Some items may interact with your medicine. What should I watch for while using this medicine? Visit your doctor for checks on your progress. This drug may make you feel generally unwell. This is not uncommon, as chemotherapy can affect healthy cells as well as cancer cells. Report any side effects. Continue your course of treatment even though you feel ill unless your doctor tells you to stop. In some cases, you may be given additional medicines to help with side effects. Follow all directions for their use. Call your doctor or health care professional for advice if you get a fever, chills or sore throat, or other symptoms of a cold or flu. Do not treat yourself. This drug decreases your body's ability to fight infections. Try to avoid being around people who are sick. This medicine may increase your risk to bruise or bleed. Call your doctor or health care professional if you notice any unusual bleeding. Be careful brushing and flossing your teeth or using a toothpick because you may get an infection or bleed more easily. If you have any dental work done, tell your dentist you are receiving this medicine. Avoid taking products that contain aspirin, acetaminophen, ibuprofen, naproxen, or ketoprofen unless instructed by your doctor. These medicines may hide a fever. Call your doctor or health care professional if you get diarrhea or mouth sores. Do not treat yourself. To protect your kidneys, drink water or other fluids as directed while you are taking this medicine. Do not become pregnant while taking this medicine or for 6 months after stopping it. Women should inform their doctor if they wish to become pregnant or think they might be pregnant. Men should not father a child while taking this  medicine and for 3 months after stopping it. This may interfere with the ability to father a child. You should talk to your doctor or health care professional if you are concerned about your fertility. There is a potential for serious side effects to an unborn child. Talk to your health care professional or pharmacist for more information. Do not breast-feed an infant while taking this medicine or for 1 week after stopping it. What side effects may I notice from receiving this medicine? Side effects that you should report to your doctor or health care professional as soon as possible: -allergic reactions like skin rash, itching or hives, swelling of the face, lips, or tongue -breathing problems -redness, blistering, peeling or loosening of the skin, including inside the mouth -signs and symptoms of bleeding such as bloody or black, tarry stools; red or dark-brown urine; spitting up blood or brown material that looks like coffee grounds; red spots on the skin; unusual bruising or bleeding from the eye, gums, or nose -signs and symptoms of infection like fever or chills; cough; sore throat; pain or trouble passing urine -signs and symptoms of kidney injury like trouble passing urine or change in the amount of urine -signs and symptoms of liver injury like dark yellow or brown urine; general  ill feeling or flu-like symptoms; light-colored stools; loss of appetite; nausea; right upper belly pain; unusually weak or tired; yellowing of the eyes or skin Side effects that usually do not require medical attention (report to your doctor or health care professional if they continue or are bothersome): -constipation -mouth sores -nausea, vomiting -unusually weak or tired This list may not describe all possible side effects. Call your doctor for medical advice about side effects. You may report side effects to FDA at 1-800-FDA-1088. Where should I keep my medicine? This drug is given in a hospital or clinic and  will not be stored at home. NOTE: This sheet is a summary. It may not cover all possible information. If you have questions about this medicine, talk to your doctor, pharmacist, or health care provider.  2019 Elsevier/Gold Standard (2017-07-19 16:11:33)  Carboplatin injection What is this medicine? CARBOPLATIN (KAR boe pla tin) is a chemotherapy drug. It targets fast dividing cells, like cancer cells, and causes these cells to die. This medicine is used to treat ovarian cancer and many other cancers. This medicine may be used for other purposes; ask your health care provider or pharmacist if you have questions. COMMON BRAND NAME(S): Paraplatin What should I tell my health care provider before I take this medicine? They need to know if you have any of these conditions: -blood disorders -hearing problems -kidney disease -recent or ongoing radiation therapy -an unusual or allergic reaction to carboplatin, cisplatin, other chemotherapy, other medicines, foods, dyes, or preservatives -pregnant or trying to get pregnant -breast-feeding How should I use this medicine? This drug is usually given as an infusion into a vein. It is administered in a hospital or clinic by a specially trained health care professional. Talk to your pediatrician regarding the use of this medicine in children. Special care may be needed. Overdosage: If you think you have taken too much of this medicine contact a poison control center or emergency room at once. NOTE: This medicine is only for you. Do not share this medicine with others. What if I miss a dose? It is important not to miss a dose. Call your doctor or health care professional if you are unable to keep an appointment. What may interact with this medicine? -medicines for seizures -medicines to increase blood counts like filgrastim, pegfilgrastim, sargramostim -some antibiotics like amikacin, gentamicin, neomycin, streptomycin, tobramycin -vaccines Talk to  your doctor or health care professional before taking any of these medicines: -acetaminophen -aspirin -ibuprofen -ketoprofen -naproxen This list may not describe all possible interactions. Give your health care provider a list of all the medicines, herbs, non-prescription drugs, or dietary supplements you use. Also tell them if you smoke, drink alcohol, or use illegal drugs. Some items may interact with your medicine. What should I watch for while using this medicine? Your condition will be monitored carefully while you are receiving this medicine. You will need important blood work done while you are taking this medicine. This drug may make you feel generally unwell. This is not uncommon, as chemotherapy can affect healthy cells as well as cancer cells. Report any side effects. Continue your course of treatment even though you feel ill unless your doctor tells you to stop. In some cases, you may be given additional medicines to help with side effects. Follow all directions for their use. Call your doctor or health care professional for advice if you get a fever, chills or sore throat, or other symptoms of a cold or flu. Do not treat yourself.  This drug decreases your body's ability to fight infections. Try to avoid being around people who are sick. This medicine may increase your risk to bruise or bleed. Call your doctor or health care professional if you notice any unusual bleeding. Be careful brushing and flossing your teeth or using a toothpick because you may get an infection or bleed more easily. If you have any dental work done, tell your dentist you are receiving this medicine. Avoid taking products that contain aspirin, acetaminophen, ibuprofen, naproxen, or ketoprofen unless instructed by your doctor. These medicines may hide a fever. Do not become pregnant while taking this medicine. Women should inform their doctor if they wish to become pregnant or think they might be pregnant. There is a  potential for serious side effects to an unborn child. Talk to your health care professional or pharmacist for more information. Do not breast-feed an infant while taking this medicine. What side effects may I notice from receiving this medicine? Side effects that you should report to your doctor or health care professional as soon as possible: -allergic reactions like skin rash, itching or hives, swelling of the face, lips, or tongue -signs of infection - fever or chills, cough, sore throat, pain or difficulty passing urine -signs of decreased platelets or bleeding - bruising, pinpoint red spots on the skin, black, tarry stools, nosebleeds -signs of decreased red blood cells - unusually weak or tired, fainting spells, lightheadedness -breathing problems -changes in hearing -changes in vision -chest pain -high blood pressure -low blood counts - This drug may decrease the number of white blood cells, red blood cells and platelets. You may be at increased risk for infections and bleeding. -nausea and vomiting -pain, swelling, redness or irritation at the injection site -pain, tingling, numbness in the hands or feet -problems with balance, talking, walking -trouble passing urine or change in the amount of urine Side effects that usually do not require medical attention (report to your doctor or health care professional if they continue or are bothersome): -hair loss -loss of appetite -metallic taste in the mouth or changes in taste This list may not describe all possible side effects. Call your doctor for medical advice about side effects. You may report side effects to FDA at 1-800-FDA-1088. Where should I keep my medicine? This drug is given in a hospital or clinic and will not be stored at home. NOTE: This sheet is a summary. It may not cover all possible information. If you have questions about this medicine, talk to your doctor, pharmacist, or health care provider.  2019 Elsevier/Gold  Standard (2007-09-04 14:38:05)

## 2018-11-08 NOTE — Progress Notes (Signed)
[facility] OFFICE PROGRESS NOTE  [name], [address] [place] [address]  DIAGNOSIS:  1)Stage IV (T1c, N0, M1 C) non-small cell lung cancer, adenocarcinoma presented with left upper lobe lung nodule in addition to metastatic disease to the bone diagnosed in April 2020. 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  Molecular studies by [employer] 588  STK11Splice Site SNV 5.0% Everolimus,Temsirolimus  [id number] 0.8% None (VUS),No (VUS)  NTRK3 L629L 1.5% (Synonymous)No (Synonymous)  KRAS G12D 1.1% Binimetinib  CDKN2A H83Y 1.6% Abemaciclib, Palbociclib, Ribociclib  PDL1 TPS was 0%  PRIOR THERAPY: None  CURRENT THERAPY: Systemic chemotherapy with carboplatin for AUC of 5, Alimta 500 mg/M2 and Keytruda 200 mg IV every 3 weeks. First dose Oct 16, 2018. Status post 1 cycle of treatment.  INTERVAL HISTORY: [name] 73y.o. male returns to the clinic for a follow-up visit.  The patient is feeling fairly well today without any concerning complaints. He recently completed his first cycle of treatment he tolerated well without any adverse effects except for some mild fatigue. He denies any fever, chills, or weight loss. He denies any chest pain or hemoptysis. He has some baseline shortness of breath with exertion but he is still able to perform daily activities such as mowing the lawn with intermittent breaks. He also reports a mild cough associated with post nasal drainage from his allergies. He denies any current nausea and vomiting except for one episode of nausea following treatment. The patient reported a few episodes of insignificant diarrhea as well as constipation since his last treatment. Denies any headaches or visual changes.  He reports occasional rashes on his scalp and shoulders. He is unsure if this is related to his treatment  or if it is related to his meloxicam use since he had something similar in the past. He is here today for evaluation before starting cycle #2 of his treatment.  MEDICAL HISTORY: Past Medical History:  Diagnosis Date  . Anginal pain ([facility]   . Anxiety    treated /w Xanax for mild depression , using 2 times per day, not PRN  . Asthma   . Benign prostatic hypertrophy   . CAD (coronary artery disease)    pt last left heart cath was in Nov 2008. EF was 55% on left ventriculogram. Circumflex was totally occluded after the 1st obtuse marginal with collaterals supplying the distal circumflex. LAD showed luminal irregularities. The stent in LAD was patent. The RCA showed luminal irregularities. The stent in th emid RCA and PDA were patent. There were no inverventions.   . Chest pain    from anxiety last time 1 month ago  . [place]ophobe [place]   s/p left kidney wedge resection  . Chronic obstructive pulmonary disease (HCC)    asthma  . Cluster headache    relative to sinus problems none recnt  . Constipation   . Depression   . DM2 (diabetes mellitus, type 2) ([facility]    well with diet and exercise controlled  . GERD (gastroesophageal reflux disease)    hiatal hernia. Patient did have a Nissen fundoplication rare  . Heart murmur    heard 30 yrs ago  . History of blood transfusion    need for Bld. transfusion relative to taking NSAIDS  . HTN (hypertension)    pt. followed by [facility], last cardiac visit 2012, one yr. ago  .  Hyperlipidemia   . Joint pain   . Lactose intolerance   . Left kidney mass   . Low back pain    chronic  . Metastatic non-small cell lung cancer (HCC)    adenocarcinoma, LUL, bone metastasis in 09/2018  . Neuromuscular disorder ([place])    nerve involvement in back & upper back relative to hardware in neck   . Obesity   . OSA and COPD overlap syndrome ([place]) 02/25/2015   no cpap used pt denies sleep apnea  . Osteoarthritis   . Peptic  ulcer disease   . Pneumonia not recent per pt  . 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 Sleep concern    states the study (2003 )was done at [facility] it failed & he was told to return & he never did. Pt. states he has been found by prev. hosp. staff that he has to be told when to breathe & his wife does the same.   Marland Kitchen Spinal fracture    hx of traumatic in Jun 04, 2007 after falling off the roof  . Tinnitus, bilateral   . Vitamin D deficiency     ALLERGIES:  is allergic to codeine; meloxicam; morphine and related; nsaids; oxycodone; crestor [rosuvastatin]; cymbalta [duloxetine hcl]; dilaudid [hydromorphone hcl]; effexor [venlafaxine]; gluten meal; topamax [topiramate]; tramadol hcl; trazodone and nefazodone; adhesive [tape]; penicillins; and sulfa antibiotics.  MEDICATIONS:  Current Outpatient Medications  Medication Sig Dispense Refill  . acetaminophen (TYLENOL) 500 MG tablet Take 1 tablet (500 mg total) by mouth every 6 (six) hours as needed. 30 tablet 0  . Alpha-Lipoic Acid 200 MG CAPS Take by mouth 2 (two) times daily.    . Ascorbic Acid (VITAMIN C) 1000 MG tablet Take 1,000 mg by mouth 2 (two) times daily.    . bisoprolol-hydrochlorothiazide (ZIAC) 5-6.25 MG tablet Take 1 tablet by mouth daily. 90 tablet 1  . Blood Glucose Monitoring Suppl (FREESTYLE FREEDOM LITE) w/Device KIT Check blood sugar 1 time a day. DX-E11.21 1 each 0  . Cholecalciferol (VITAMIN D3) 5000 units CAPS Take 5,000 Units by mouth 2 (two) times daily.    . CHOLINE PO Take 600 mg by mouth daily.    . clopidogrel (PLAVIX) 75 MG tablet Take 1 tablet (75 mg total) by mouth daily after breakfast. 90 tablet 1  . diazepam (VALIUM) 5 MG tablet TAKE 1/2 TO 1 TABLET BY MOUTH TWICE DAILY ONLY IF NEEDED FOR ANXIETY ATTACK AND LIMIT TO 5 DAYS A WEEK TO AVOID ADDICTION 60 tablet 0  . DOXYLAMINE SUCCINATE PO Take 0.5 tablets by mouth at bedtime.     . folic acid (FOLVITE) 1 MG  tablet Take 1 tablet (1 mg total) by mouth daily. 30 tablet 4  . ipratropium (ATROVENT) 0.03 % nasal spray Place 1-2 sprays into both nostrils daily as needed (for allergies.). 30 mL 3  . levofloxacin (LEVAQUIN) 500 MG tablet Take 1 tablet daily with food for infection 15 tablet 1  . lidocaine-prilocaine (EMLA) cream Apply 1 application topically as needed. 30 g 0  . Magnesium 500 MG TABS Take 400 mg by mouth at bedtime. Take 436m QID    . MELATONIN PO Take 30 mg by mouth at bedtime.    . Multiple Vitamins-Minerals (MULTIVITAMIN ADULT PO) Take by mouth daily.    . nitroGLYCERIN (NITROSTAT) 0.4 MG SL tablet Place 0.4 mg under the tongue every 5 (five) minutes x 3 doses  as needed for chest pain.    Marland Kitchen prochlorperazine (COMPAZINE) 10 MG tablet Take 1 tablet (10 mg total) by mouth every 6 (six) hours as needed for nausea or vomiting. 30 tablet 0  . SUPER B COMPLEX/C PO Take by mouth daily.    . tamsulosin (FLOMAX) 0.4 MG CAPS capsule Take 1 capsule daily for Prostate 90 capsule 1  . tetrahydrozoline-zinc (VISINE-AC) 0.05-0.25 % ophthalmic solution Place 1-2 drops into both eyes 3 (three) times daily as needed (for redness/irritation.).    Marland Kitchen triamcinolone ointment (KENALOG) 0.1 % Apply 1 application topically 2 (two) times daily. 80 g 1  . Turmeric 500 MG TABS Take by mouth. Takes 3 in the morning and 3 in the evening    . VALERIAN PO Take 400 mg by mouth daily.     No current facility-administered medications for this visit.     SURGICAL HISTORY:  Past Surgical History:  Procedure Laterality Date  . BACK SURGERY     x3- last surgery lumbar- 1998- / fusion   . blepheroplasty     both eyesx2  . C5-T6 posterior fusion     with [place] and radius screws  . [place], 2008 & 2012 multiple stents  total 4 times  . cataracts     cataracts removed- /w IOL - both eyes   . CERVICAL SPINE SURGERY    . COLONOSCOPY    . ESOPHAGEAL MANOMETRY    . HARDWARE REMOVAL  02/20/2012    Procedure: HARDWARE REMOVAL;  Surgeon: [name], MD;  Location: [facility];  Service: Neurosurgery;  Laterality: N/A;  Hardware Removal  . HIATAL HERNIA REPAIR  1979, spring 2009  . IR IMAGING GUIDED PORT INSERTION  10/29/2018  . LAPAROSCOPIC CHOLECYSTECTOMY     & IOC  . multiple percutaneous coronary interventions    . NASAL SINUS SURGERY     x2, sees Dr. [name], still having problems, states he uses Benadryl PRN- up to 5 times per day   . right temporal artery biopsy    . right wrist plate insertion  41/3244  . ROBOTIC ASSITED PARTIAL NEPHRECTOMY Left 03/02/2018   Procedure: XI ROBOTIC ASSITED LEFT PARTIAL NEPHRECTOMY WITH LYSIS OF ADHESIONS X30 MINUTES.;  Surgeon: Ardis Hughs, MD;  Location: [facility];  Service: Urology;  Laterality: Left;  CLAMP TIME FOR KIDNEY 1053-1110 TOTAL 18MINUTES  . Rotator cuff surgery Right   . SKIN BIOPSY Right 10/31/2017   Chest  . TRANSURETHRAL RESECTION OF BLADDER TUMOR N/A 11/03/2017   Procedure: cystoscopy;  Surgeon: Kathie Rhodes, MD;  Location: [facility];  Service: Urology;  Laterality: N/A;  . UMBILICAL HERNIA REPAIR    . UPPER GI ENDOSCOPY    . VENTRAL HERNIA REPAIR N/A 03/02/2018   Procedure: LAPAROSCOPIC REPAIR OF INCARCERATED INCISIONAL HERNIA WITH LYSIS OF ADHESIONS;  Surgeon: Michael Boston, MD;  Location: [facility];  Service: General;  Laterality: N/A;    REVIEW OF SYSTEMS:   Review of Systems  Constitutional: Positive for fatigue. Negative for appetite change, chills, fever and unexpected weight change.  HENT:   Negative for mouth sores, nosebleeds, sore throat and trouble swallowing.   Eyes: Negative for eye problems and icterus.  Respiratory: Positive for baseline cough and dyspnea on exertion. Negative for hemoptysis and wheezing.   Cardiovascular: Negative for chest pain and leg swelling.  Gastrointestinal: Positive for occasional diarrhea, constipation, and a single episode of nausea. Negative for abdominal painand vomiting.   Genitourinary: Negative  for bladder incontinence, difficulty urinating, dysuria, frequency and hematuria.   Musculoskeletal: Negative for back pain, gait problem, neck pain and neck stiffness.  Skin: Positive for a rash on his scalp and shoulders (resolved) Neurological: Negative for dizziness, extremity weakness, gait problem, headaches, light-headedness and seizures.  Hematological: Negative for adenopathy. Does not bruise/bleed easily.  Psychiatric/Behavioral: Negative for confusion, depression and sleep disturbance. The patient is not nervous/anxious.     PHYSICAL EXAMINATION:  Blood pressure 127/73, pulse 71, temperature 98.7 F (37.1 C), temperature source Oral, resp. rate 18, height _0  (1.702 m), weight 201 lb 11.2 oz (91.5 kg), SpO2 98 %.  ECOG PERFORMANCE STATUS: 1 - Symptomatic but completely ambulatory  Physical Exam  Constitutional: Oriented to person, place, and time and well-developed, well-nourished, and in no distress.  HENT:  Head: Normocephalic and atraumatic.  Mouth/Throat: Oropharynx is clear and moist. No oropharyngeal exudate.  Eyes: Conjunctivae are normal. Right eye exhibits no discharge. Left eye exhibits no discharge. No scleral icterus.  Neck: Normal range of motion. Neck supple.  Cardiovascular: Normal rate, regular rhythm, normal heart sounds and intact distal pulses.   Pulmonary/Chest: Effort normal and breath sounds normal. No respiratory distress. No wheezes. No rales.  Abdominal: Soft. Bowel sounds are normal. Exhibits no distension and no mass. There is no tenderness.  Musculoskeletal: Normal range of motion. Exhibits no edema.  Lymphadenopathy:    No cervical adenopathy.  Neurological: Alert and oriented to person, place, and time. Exhibits normal muscle tone. Gait normal. Coordination normal.  Skin: Skin is warm and dry. No rash noted. Not diaphoretic. No erythema. No pallor.  Psychiatric: Mood, memory and judgment normal.  Vitals  reviewed.  LABORATORY DATA: Lab Results  Component Value Date   WBC 4.0 11/08/2018   HGB 14.4 11/08/2018   HCT 41.7 11/08/2018   MCV 90.5 11/08/2018   PLT 139 (L) 11/08/2018      Chemistry      Component Value Date/Time   NA 131 (L) 11/08/2018 0858   K 4.1 11/08/2018 0858   CL 99 11/08/2018 0858   CO2 22 11/08/2018 0858   BUN 22 11/08/2018 0858   CREATININE 1.12 11/08/2018 0858   CREATININE 1.18 08/28/2018 1055      Component Value Date/Time   CALCIUM 8.8 (L) 11/08/2018 0858   ALKPHOS 69 11/08/2018 0858   AST 30 11/08/2018 0858   ALT 34 11/08/2018 0858   BILITOT 0.3 11/08/2018 0858       RADIOGRAPHIC STUDIES:  Ir Imaging Guided Port Insertion  Result Date: 10/29/2018 CLINICAL DATA:  metastatic small cell lung ca, needs durable venous access for planned chemotherapy regimen EXAM: TUNNELED PORT CATHETER PLACEMENT WITH ULTRASOUND AND FLUOROSCOPIC GUIDANCE FLUOROSCOPY TIME:  0.1 minutes; 30 uGym2 DAP ANESTHESIA/SEDATION: Intravenous Fentanyl 150mg and Versed 277mwere administered as conscious sedation during continuous monitoring of the patient's level of consciousness and physiological / cardiorespiratory status by the radiology RN, with a total moderate sedation time of 12 minutes. TECHNIQUE: The procedure, risks, benefits, and alternatives were explained to the patient. Questions regarding the procedure were encouraged and answered. The patient understands and consents to the procedure. As antibiotic prophylaxis, vancomycin 1 g was ordered pre-procedure and administered intravenously within one hour of incision. Patency of the right IJ vein was confirmed with ultrasound with image documentation. An appropriate skin site was determined. Skin site was marked. Region was prepped using maximum barrier technique including cap and mask, sterile gown, sterile gloves, large sterile sheet, and Chlorhexidine as cutaneous antisepsis.  The region was infiltrated locally with 1% lidocaine.  Under real-time ultrasound guidance, the right IJ vein was accessed with a 21 gauge micropuncture needle; the needle tip within the vein was confirmed with ultrasound image documentation. Needle was exchanged over a 018 guidewire for transitional dilator which allowed passage of the [facility] wire into the IVC. Over this, the transitional dilator was exchanged for a 5 [place] MPA catheter. A small incision was made on the right anterior chest wall and a subcutaneous pocket fashioned. The power-injectable port was positioned and its catheter tunneled to the right IJ dermatotomy site. The MPA catheter was exchanged over an Amplatz wire for a peel-away sheath, through which the port catheter, which had been trimmed to the appropriate length, was advanced and positioned under fluoroscopy with its tip at the cavoatrial junction. Spot chest radiograph confirms good catheter position and no pneumothorax. The pocket was closed with deep interrupted and subcuticular continuous 3-0 Monocryl sutures. The port was flushed per protocol. The incisions were covered with Dermabond then covered with a sterile dressing. COMPLICATIONS: COMPLICATIONS None immediate IMPRESSION: Technically successful right IJ power-injectable port catheter placement. Ready for routine use. Electronically Signed   By: Lucrezia Europe M.D.   On: 10/29/2018 17:00     ASSESSMENT/PLAN:  This is a very pleasant 73 year old Caucasian male recently diagnosed with non-small cell lung cancer, adenocarcinoma.  He presented with a left upper lobe lung nodule in addition to metastatic bone disease.  He was diagnosed in April 2020.  His PDL 1 expression is negative and he has no actionable mutations.  The patient also has a history of renal cell carcinoma.  He is status post a wedge resection to the left kidney in September 2019.  The patient is currently undergoing palliative systemic chemotherapy with carboplatin for an AUC 5, Alimta 500 mg/m2, and Keytruda 200 mg IV  every 3 weeks.  The past patient is status post 1 cycle of treatment.  He tolerated well without any adverse effects except for some mild fatigue and mild constipation.  The patient was seen with Dr. Julien Nordmann today.  Labs were reviewed with the patient. We recommend that he proceed with cycle #2 today scheduled.  I will see him back for follow-up visit in 3 weeks for evaluation before starting cycle #3.  The patient was advised to call immediately if he has any concerning symptoms in the interval. The patient voices understanding of current disease status and treatment options and is in agreement with the current care plan. All questions were answered. The patient knows to call the clinic with any problems, questions or concerns. We can certainly see the patient much sooner if necessary  No orders of the defined types were placed in this encounter.    Darrell Larkey L Tregan Read, PA-C 11/08/18  ADDENDUM: Hematology/Oncology Attending: I have been face-to-face encounter with the patient today.  I recommended his care plan.  This is a very pleasant 73 years old white male with metastatic non-small cell lung cancer, adenocarcinoma.  The patient is currently undergoing systemic chemotherapy with carboplatin, Alimta and Keytruda status post 1 cycle.  He tolerated the first cycle fairly well with no concerning complaints except for mild fatigue and intermittent constipation. I recommended for the patient to proceed with cycle #2 today as planned. I will see him back for follow-up visit in 3 weeks for evaluation before the next cycle of his treatment. He was advised to call immediately if he has any concerning symptoms in the interval.  Disclaimer: This note was dictated with voice recognition software. Similar sounding words can inadvertently be transcribed and may be missed upon review. Eilleen Kempf, MD 11/08/18

## 2018-11-11 ENCOUNTER — Other Ambulatory Visit: Payer: Self-pay | Admitting: Internal Medicine

## 2018-11-12 ENCOUNTER — Encounter: Payer: Self-pay | Admitting: Internal Medicine

## 2018-11-12 NOTE — Addendum Note (Signed)
Addended by: [name] on: 11/12/2018 06:21 PM   Modules accepted: Level of Service

## 2018-11-15 ENCOUNTER — Inpatient Hospital Stay: Payer: Medicare Other | Attending: Internal Medicine

## 2018-11-15 ENCOUNTER — Inpatient Hospital Stay: Payer: Medicare Other

## 2018-11-15 ENCOUNTER — Other Ambulatory Visit: Payer: Self-pay

## 2018-11-15 DIAGNOSIS — Z5112 Encounter for antineoplastic immunotherapy: Secondary | ICD-10-CM | POA: Insufficient documentation

## 2018-11-15 DIAGNOSIS — C3412 Malignant neoplasm of upper lobe, left bronchus or lung: Secondary | ICD-10-CM | POA: Diagnosis not present

## 2018-11-15 DIAGNOSIS — C7951 Secondary malignant neoplasm of bone: Secondary | ICD-10-CM | POA: Diagnosis not present

## 2018-11-15 DIAGNOSIS — Z5111 Encounter for antineoplastic chemotherapy: Secondary | ICD-10-CM | POA: Insufficient documentation

## 2018-11-15 DIAGNOSIS — Z79899 Other long term (current) drug therapy: Secondary | ICD-10-CM | POA: Insufficient documentation

## 2018-11-15 DIAGNOSIS — Z95828 Presence of other vascular implants and grafts: Secondary | ICD-10-CM

## 2018-11-15 DIAGNOSIS — C3492 Malignant neoplasm of unspecified part of left bronchus or lung: Secondary | ICD-10-CM

## 2018-11-15 LAB — CMP (CANCER CENTER ONLY)
ALT: 32 U/L (ref 0–44)
AST: 32 U/L (ref 15–41)
Albumin: 3.2 g/dL — ABNORMAL LOW (ref 3.5–5.0)
Alkaline Phosphatase: 86 U/L (ref 38–126)
Anion gap: 10 (ref 5–15)
BUN: 26 mg/dL — ABNORMAL HIGH (ref 8–23)
CO2: 21 mmol/L — ABNORMAL LOW (ref 22–32)
Calcium: 8.6 mg/dL — ABNORMAL LOW (ref 8.9–10.3)
Chloride: 96 mmol/L — ABNORMAL LOW (ref 98–111)
Creatinine: 1.43 mg/dL — ABNORMAL HIGH (ref 0.61–1.24)
GFR, Est AFR Am: 55 mL/min — ABNORMAL LOW
GFR, Estimated: 48 mL/min — ABNORMAL LOW
Glucose, Bld: 94 mg/dL (ref 70–99)
Potassium: 4.5 mmol/L (ref 3.5–5.1)
Sodium: 127 mmol/L — ABNORMAL LOW (ref 135–145)
Total Bilirubin: 0.3 mg/dL (ref 0.3–1.2)
Total Protein: 6.7 g/dL (ref 6.5–8.1)

## 2018-11-15 LAB — CBC WITH DIFFERENTIAL (CANCER CENTER ONLY)
Basophils Absolute: 0 10*3/uL (ref 0.0–0.1)
Basophils Relative: 1 %
Eosinophils Absolute: 0.1 10*3/uL (ref 0.0–0.5)
Eosinophils Relative: 5 %
HCT: 38 % — ABNORMAL LOW (ref 39.0–52.0)
Hemoglobin: 12.8 g/dL — ABNORMAL LOW (ref 13.0–17.0)
Lymphocytes Relative: 45 %
Lymphs Abs: 1.1 10*3/uL (ref 0.7–4.0)
MCH: 31.1 pg (ref 26.0–34.0)
MCHC: 33.7 g/dL (ref 30.0–36.0)
MCV: 92.2 fL (ref 80.0–100.0)
Monocytes Absolute: 0.2 10*3/uL (ref 0.1–1.0)
Monocytes Relative: 10 %
Neutro Abs: 1 10*3/uL — ABNORMAL LOW (ref 1.7–7.7)
Neutrophils Relative %: 40 %
Platelet Count: 103 10*3/uL — ABNORMAL LOW (ref 150–400)
RBC: 4.12 MIL/uL — ABNORMAL LOW (ref 4.22–5.81)
RDW: 13.6 % (ref 11.5–15.5)
WBC Count: 2.4 10*3/uL — ABNORMAL LOW (ref 4.0–10.5)
nRBC: 0 % (ref 0.0–0.2)

## 2018-11-15 MED ORDER — SODIUM CHLORIDE 0.9% FLUSH
10.0000 mL | INTRAVENOUS | Status: DC | PRN
  Administered 2018-11-15: 10 mL via INTRAVENOUS
  Filled 2018-11-15: qty 10

## 2018-11-15 MED ORDER — HEPARIN SOD (PORK) LOCK FLUSH 100 UNIT/ML IV SOLN
500.0000 [IU] | Freq: Once | INTRAVENOUS | Status: AC
  Administered 2018-11-15: 500 [IU] via INTRAVENOUS
  Filled 2018-11-15: qty 5

## 2018-11-21 DIAGNOSIS — L821 Other seborrheic keratosis: Secondary | ICD-10-CM | POA: Diagnosis not present

## 2018-11-21 DIAGNOSIS — D1801 Hemangioma of skin and subcutaneous tissue: Secondary | ICD-10-CM | POA: Diagnosis not present

## 2018-11-21 DIAGNOSIS — L820 Inflamed seborrheic keratosis: Secondary | ICD-10-CM | POA: Diagnosis not present

## 2018-11-21 DIAGNOSIS — D225 Melanocytic nevi of trunk: Secondary | ICD-10-CM | POA: Diagnosis not present

## 2018-11-21 DIAGNOSIS — R202 Paresthesia of skin: Secondary | ICD-10-CM | POA: Diagnosis not present

## 2018-11-22 ENCOUNTER — Inpatient Hospital Stay: Payer: Medicare Other

## 2018-11-22 ENCOUNTER — Other Ambulatory Visit: Payer: Self-pay

## 2018-11-22 DIAGNOSIS — R5382 Chronic fatigue, unspecified: Secondary | ICD-10-CM

## 2018-11-22 DIAGNOSIS — Z5111 Encounter for antineoplastic chemotherapy: Secondary | ICD-10-CM | POA: Diagnosis not present

## 2018-11-22 DIAGNOSIS — C3492 Malignant neoplasm of unspecified part of left bronchus or lung: Secondary | ICD-10-CM

## 2018-11-22 DIAGNOSIS — Z5112 Encounter for antineoplastic immunotherapy: Secondary | ICD-10-CM | POA: Diagnosis not present

## 2018-11-22 DIAGNOSIS — Z79899 Other long term (current) drug therapy: Secondary | ICD-10-CM | POA: Diagnosis not present

## 2018-11-22 DIAGNOSIS — Z95828 Presence of other vascular implants and grafts: Secondary | ICD-10-CM

## 2018-11-22 DIAGNOSIS — C3412 Malignant neoplasm of upper lobe, left bronchus or lung: Secondary | ICD-10-CM | POA: Diagnosis not present

## 2018-11-22 DIAGNOSIS — C7951 Secondary malignant neoplasm of bone: Secondary | ICD-10-CM | POA: Diagnosis not present

## 2018-11-22 LAB — CBC WITH DIFFERENTIAL (CANCER CENTER ONLY)
Abs Immature Granulocytes: 0.01 10*3/uL (ref 0.00–0.07)
Basophils Absolute: 0 10*3/uL (ref 0.0–0.1)
Basophils Relative: 1 %
Eosinophils Absolute: 0.1 10*3/uL (ref 0.0–0.5)
Eosinophils Relative: 2 %
HCT: 37.4 % — ABNORMAL LOW (ref 39.0–52.0)
Hemoglobin: 13.1 g/dL (ref 13.0–17.0)
Immature Granulocytes: 0 %
Lymphocytes Relative: 36 %
Lymphs Abs: 1.3 10*3/uL (ref 0.7–4.0)
MCH: 32.3 pg (ref 26.0–34.0)
MCHC: 35 g/dL (ref 30.0–36.0)
MCV: 92.3 fL (ref 80.0–100.0)
Monocytes Absolute: 0.6 10*3/uL (ref 0.1–1.0)
Monocytes Relative: 18 %
Neutro Abs: 1.5 10*3/uL — ABNORMAL LOW (ref 1.7–7.7)
Neutrophils Relative %: 43 %
Platelet Count: 110 10*3/uL — ABNORMAL LOW (ref 150–400)
RBC: 4.05 MIL/uL — ABNORMAL LOW (ref 4.22–5.81)
RDW: 14.6 % (ref 11.5–15.5)
WBC Count: 3.5 10*3/uL — ABNORMAL LOW (ref 4.0–10.5)
nRBC: 0 % (ref 0.0–0.2)

## 2018-11-22 LAB — CMP (CANCER CENTER ONLY)
ALT: 54 U/L — ABNORMAL HIGH (ref 0–44)
AST: 43 U/L — ABNORMAL HIGH (ref 15–41)
Albumin: 3.5 g/dL (ref 3.5–5.0)
Alkaline Phosphatase: 88 U/L (ref 38–126)
Anion gap: 10 (ref 5–15)
BUN: 21 mg/dL (ref 8–23)
CO2: 23 mmol/L (ref 22–32)
Calcium: 8.3 mg/dL — ABNORMAL LOW (ref 8.9–10.3)
Chloride: 99 mmol/L (ref 98–111)
Creatinine: 1.14 mg/dL (ref 0.61–1.24)
GFR, Est AFR Am: >60 mL/min (ref >60)
GFR, Estimated: >60 mL/min (ref >60)
Glucose, Bld: 96 mg/dL (ref 70–99)
Potassium: 4.3 mmol/L (ref 3.5–5.1)
Sodium: 132 mmol/L — ABNORMAL LOW (ref 135–145)
Total Bilirubin: 0.3 mg/dL (ref 0.3–1.2)
Total Protein: 7.5 g/dL (ref 6.5–8.1)

## 2018-11-22 MED ORDER — HEPARIN SOD (PORK) LOCK FLUSH 100 UNIT/ML IV SOLN
500.0000 [IU] | Freq: Once | INTRAVENOUS | Status: AC
  Administered 2018-11-22: 500 [IU]
  Filled 2018-11-22: qty 5

## 2018-11-22 MED ORDER — SODIUM CHLORIDE 0.9% FLUSH
10.0000 mL | Freq: Once | INTRAVENOUS | Status: AC
  Administered 2018-11-22: 10 mL
  Filled 2018-11-22: qty 10

## 2018-11-23 LAB — TSH: TSH: 1.246 u[IU]/mL (ref 0.320–4.118)

## 2018-11-28 ENCOUNTER — Other Ambulatory Visit: Payer: Self-pay

## 2018-11-28 ENCOUNTER — Ambulatory Visit (INDEPENDENT_AMBULATORY_CARE_PROVIDER_SITE_OTHER): Payer: Medicare Other | Admitting: Internal Medicine

## 2018-11-28 ENCOUNTER — Encounter: Payer: Self-pay | Admitting: Internal Medicine

## 2018-11-28 VITALS — BP 124/72 | HR 72 | Temp 97.3°F | Resp 16 | Ht 67.0 in | Wt 204.2 lb

## 2018-11-28 DIAGNOSIS — E782 Mixed hyperlipidemia: Secondary | ICD-10-CM | POA: Diagnosis not present

## 2018-11-28 DIAGNOSIS — M109 Gout, unspecified: Secondary | ICD-10-CM | POA: Diagnosis not present

## 2018-11-28 DIAGNOSIS — E559 Vitamin D deficiency, unspecified: Secondary | ICD-10-CM | POA: Diagnosis not present

## 2018-11-28 DIAGNOSIS — I10 Essential (primary) hypertension: Secondary | ICD-10-CM

## 2018-11-28 DIAGNOSIS — Z79899 Other long term (current) drug therapy: Secondary | ICD-10-CM | POA: Diagnosis not present

## 2018-11-28 DIAGNOSIS — N183 Chronic kidney disease, stage 3 unspecified: Secondary | ICD-10-CM

## 2018-11-28 DIAGNOSIS — K219 Gastro-esophageal reflux disease without esophagitis: Secondary | ICD-10-CM

## 2018-11-28 DIAGNOSIS — I25810 Atherosclerosis of coronary artery bypass graft(s) without angina pectoris: Secondary | ICD-10-CM | POA: Diagnosis not present

## 2018-11-28 DIAGNOSIS — R7309 Other abnormal glucose: Secondary | ICD-10-CM | POA: Diagnosis not present

## 2018-11-28 NOTE — Progress Notes (Signed)
History of Present Illness:      This very nice 73 y.o. MWM presents for 6 month follow up with N, ASCAD, HLD, Prediabetes and Vitamin D Deficiency. Patient is s/p Robotic Left Partial Nephrectomy for Ca (Sept_2019) . In April Dx'd with [facility]  Met.s to bone & under treatment by Dr Julien Nordmann.       Patient is treated for HTN (1999).  In 1992, he had ACS with PCA/Stents.   BP has been controlled at home. Today's BP is at goal -124/72. Patient has had no complaints of any cardiac type chest pain, palpitations, dyspnea / orthopnea / PND, dizziness, claudication, or dependent edema.      Hyperlipidemia is not controlled with diet & meds. Patient denies myalgias or other med SE's. Last Lipids were not at goal: Lab Results  Component Value Date   CHOL 184 08/28/2018   HDL 46 08/28/2018   LDLCALC 100 (H) 08/28/2018   LDLDIRECT 116.0 06/03/2013   TRIG 265 (H) 08/28/2018   CHOLHDL 4.0 08/28/2018       Also, the patient has moderate Obesity (BMI 31.98) and  history of T2_NIDDM (2012) and with weight loss his A1c dropped to preDiabetic A1c 5.8% and then  Normal A1c of 5.5 % in 2014.  He denies symptoms of reactive hypoglycemia, diabetic polys, paresthesias or visual blurring.  Last A1c was Normal & at goal: Lab Results  Component Value Date   HGBA1C 5.4 05/24/2018       Further, the patient also has history of Vitamin D Deficiency  ("45" on treatment 2008) and supplements vitamin D without any suspected side-effects. Last vitamin D was at goal: Lab Results  Component Value Date   VD25OH 86 05/24/2018   Current Outpatient Medications on File Prior to Visit  Medication Sig  . acetaminophen (TYLENOL) 500 MG tablet Take 1 tablet (500 mg total) by mouth every 6 (six) hours as needed.  . Alpha-Lipoic Acid 200 MG CAPS Take by mouth 2 (two) times daily.  . Ascorbic Acid (VITAMIN C) 1000 MG tablet Take 1,000 mg by mouth 2 (two) times daily.  . bisoprolol-hydrochlorothiazide (ZIAC) 5-6.25 MG  tablet Take 1 tablet by mouth daily.  . Blood Glucose Monitoring Suppl (FREESTYLE FREEDOM LITE) w/Device KIT Check blood sugar 1 time a day. [id number]  . Cholecalciferol (VITAMIN D3) 5000 units CAPS Take 5,000 Units by mouth 2 (two) times daily.  . CHOLINE PO Take 600 mg by mouth daily.  . clopidogrel (PLAVIX) 75 MG tablet Take 1 tablet (75 mg total) by mouth daily after breakfast.  . diazepam (VALIUM) 5 MG tablet TAKE 1/2 TO 1 TABLET BY MOUTH TWICE DAILY ONLY IF NEEDED FOR ANXIETY ATTACK AND LIMIT TO 5 DAYS A WEEK TO AVOID ADDICTION  . DOXYLAMINE SUCCINATE PO Take 0.5 tablets by mouth at bedtime.   . folic acid (FOLVITE) 1 MG tablet Take 1 tablet (1 mg total) by mouth daily.  Marland Kitchen ipratropium (ATROVENT) 0.03 % nasal spray Place 1-2 sprays into both nostrils daily as needed (for allergies.).  Marland Kitchen lidocaine-prilocaine (EMLA) cream Apply 1 application topically as needed.  . Magnesium 500 MG TABS Take 400 mg by mouth at bedtime. Take 438m QID  . MELATONIN PO Take 30 mg by mouth at bedtime.  . Multiple Vitamins-Minerals (MULTIVITAMIN ADULT PO) Take by mouth daily.  . nitroGLYCERIN (NITROSTAT) 0.4 MG SL tablet Place 0.4 mg under the tongue every 5 (five) minutes x 3 doses as needed for chest pain.  .Marland Kitchen  OVER THE COUNTER MEDICATION Taking OTC Iron 1 tablet daily.  . prochlorperazine (COMPAZINE) 10 MG tablet Take 1 tablet (10 mg total) by mouth every 6 (six) hours as needed for nausea or vomiting.  . SUPER B COMPLEX/C PO Take by mouth daily.  . tamsulosin (FLOMAX) 0.4 MG CAPS capsule Take 1 capsule Daily for for Prostate  . tetrahydrozoline-zinc (VISINE-AC) 0.05-0.25 % ophthalmic solution Place 1-2 drops into both eyes 3 (three) times daily as needed (for redness/irritation.).  Marland Kitchen triamcinolone ointment (KENALOG) 0.1 % Apply 1 application topically 2 (two) times daily.  . Turmeric 500 MG TABS Take by mouth. Takes 3 in the morning and 3 in the evening  . VALERIAN PO Take 400 mg by mouth daily.   No current  facility-administered medications on file prior to visit.    Allergies  Allergen Reactions  . Codeine Itching and Other (See Comments)    Also hallucinations   . Meloxicam Rash  . Morphine And Related Other (See Comments)    Hallucinations  . Nsaids Other (See Comments)    BLEEDING--naprosyn  . Oxycodone     Hallucinations  . Crestor [Rosuvastatin] Other (See Comments)    Soreness/aching  . Cymbalta [Duloxetine Hcl] Other (See Comments)    Pt is unsure of reaction type  . Dilaudid [Hydromorphone Hcl] Itching and Other (See Comments)    Hallucinations.  . Effexor [Venlafaxine] Other (See Comments)    Unsure of reactions type  . Gluten Meal Other (See Comments)    Runny nose (constant); bloating.  . Topamax [Topiramate] Other (See Comments)    Caused visual impairments/swelling in eyes--pinch off optic nerve (temporary blindness)  . Tramadol Hcl   . Trazodone And Nefazodone Other (See Comments)    Unsure of reaction type  . Adhesive [Tape] Other (See Comments)    SKIN PEELS--PAPER TAPE IS OKAY  . Penicillins Rash  . Sulfa Antibiotics Swelling, Rash and Other (See Comments)    Mouth sores   PMHx:   Past Medical History:  Diagnosis Date  . Anginal pain ([place])   . Anxiety    treated /w Xanax for mild depression , using 2 times per day, not PRN  . Asthma   . Benign prostatic hypertrophy   . CAD (coronary artery disease)    pt last left heart cath was in Nov 2008. EF was 55% on left ventriculogram. Circumflex was totally occluded after the 1st obtuse marginal with collaterals supplying the distal circumflex. LAD showed luminal irregularities. The stent in LAD was patent. The RCA showed luminal irregularities. The stent in th emid RCA and PDA were patent. There were no inverventions.   . Chest pain    from anxiety last time 1 month ago  . 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 Chronic obstructive pulmonary disease (HCC)    asthma   . Cluster headache    relative to sinus problems none recnt  . Constipation   . Depression   . DM2 (diabetes mellitus, type 2) ([place])    well with diet and exercise controlled  . GERD (gastroesophageal reflux disease)    hiatal hernia. Patient did have a Nissen fundoplication rare  . Heart murmur    heard 30 yrs ago  . History of blood transfusion    need for Bld. transfusion relative to taking NSAIDS  . HTN (hypertension)    pt. followed by  Cardiac, last cardiac visit 2012, one yr. ago  . Hyperlipidemia   .  Joint pain   . Lactose intolerance   . Left kidney mass   . Low back pain    chronic  . Metastatic non-small cell lung cancer (HCC)    adenocarcinoma, LUL, bone metastasis in 09/2018  . Neuromuscular disorder ([place])    nerve involvement in back & upper back relative to hardware in neck   . Obesity   . 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 Osteoarthritis   . Peptic ulcer disease   . Pneumonia not recent per pt  . 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 Sleep concern    states the study (2003 )was done at [facility]., it failed & he was told to return & he never did. Pt. states he has been found by prev. hosp. staff that he has to be told when to breathe & his wife does the same.   Marland Kitchen Spinal fracture    hx of traumatic in Jun 04, 2007 after falling off the roof  . Tinnitus, bilateral   . Vitamin D deficiency    Immunization History  Administered Date(s) Administered  . DT 12/09/2014  . Influenza Split 02/17/2012  . Influenza, High Dose Seasonal PF 03/03/2014, 03/11/2015, 04/04/2016, 04/28/2017, 03/05/2018  . Pneumococcal Conjugate-13 12/25/2014  . Pneumococcal Polysaccharide-23 01/19/2012  . Pneumococcal-Unspecified 06/13/1997  . Td 06/13/2004  . Zoster 06/14/2007   Past Surgical History:  Procedure Laterality Date  . BACK SURGERY     x3- last surgery lumbar-  1998- / fusion   . blepheroplasty     both eyesx2  . C5-T6 posterior fusion     with [place] and radius screws  . [place], 2008 & 2012 multiple stents  total 4 times  . cataracts     cataracts removed- /w IOL - both eyes   . CERVICAL SPINE SURGERY    . COLONOSCOPY    . ESOPHAGEAL MANOMETRY    . HARDWARE REMOVAL  02/20/2012   Procedure: HARDWARE REMOVAL;  Surgeon: [name], MD;  Location: [facility];  Service: Neurosurgery;  Laterality: N/A;  Hardware Removal  . HIATAL HERNIA REPAIR  1979, spring 2009  . IR IMAGING GUIDED PORT INSERTION  10/29/2018  . LAPAROSCOPIC CHOLECYSTECTOMY     & IOC  . multiple percutaneous coronary interventions    . NASAL SINUS SURGERY     x2, sees Dr. [name], still having problems, states he uses Benadryl PRN- up to 5 times per day   . right temporal artery biopsy    . right wrist plate insertion  16/1096  . ROBOTIC ASSITED PARTIAL NEPHRECTOMY Left 03/02/2018   Procedure: XI ROBOTIC ASSITED LEFT PARTIAL NEPHRECTOMY WITH LYSIS OF ADHESIONS X30 MINUTES.;  Surgeon: Ardis Hughs, MD;  Location: [facility];  Service: Urology;  Laterality: Left;  CLAMP TIME FOR KIDNEY 1053-1110 TOTAL 18MINUTES  . Rotator cuff surgery Right   . SKIN BIOPSY Right 10/31/2017   Chest  . TRANSURETHRAL RESECTION OF BLADDER TUMOR N/A 11/03/2017   Procedure: cystoscopy;  Surgeon: Kathie Rhodes, MD;  Location: [facility];  Service: Urology;  Laterality: N/A;  . UMBILICAL HERNIA REPAIR    . UPPER GI ENDOSCOPY    . VENTRAL HERNIA REPAIR N/A 03/02/2018   Procedure: LAPAROSCOPIC REPAIR OF INCARCERATED INCISIONAL HERNIA WITH LYSIS OF ADHESIONS;  Surgeon: Michael Boston, MD;  Location: [facility];  Service: General;  Laterality: N/A;   FHx:    Reviewed / unchanged SHx:    Reviewed / unchanged   Systems Review:  Constitutional: Denies fever, chills, wt changes, headaches, insomnia, fatigue, night sweats, change in appetite. Eyes: Denies redness, blurred vision, diplopia,  discharge, itchy, watery eyes.  ENT: Denies discharge, congestion, post nasal drip, epistaxis, sore throat, earache, hearing loss, dental pain, tinnitus, vertigo, sinus pain, snoring.  CV: Denies chest pain, palpitations, irregular heartbeat, syncope, dyspnea, diaphoresis, orthopnea, PND, claudication or edema. Respiratory: denies cough, dyspnea, DOE, pleurisy, hoarseness, laryngitis, wheezing.  Gastrointestinal: Denies dysphagia, odynophagia, heartburn, reflux, water brash, abdominal pain or cramps, nausea, vomiting, bloating, diarrhea, constipation, hematemesis, melena, hematochezia  or hemorrhoids. Genitourinary: Denies dysuria, frequency, urgency, nocturia, hesitancy, discharge, hematuria or flank pain. Musculoskeletal: Denies arthralgias, myalgias, stiffness, jt. swelling, pain, limping or strain/sprain.  Skin: Denies pruritus, rash, hives, warts, acne, eczema or change in skin lesion(s). Neuro: No weakness, tremor, incoordination, spasms, paresthesia or pain. Psychiatric: Denies confusion, memory loss or sensory loss. Endo: Denies change in weight, skin or hair change.  Heme/Lymph: No excessive bleeding, bruising or enlarged lymph nodes.  Physical Exam  BP 124/72   Pulse 72   Temp (!) 97.3 F (36.3 C)   Resp 16   Ht 5' 7" (1.702 m)   Wt 204 lb 3.2 oz (92.6 kg)   BMI 31.98 kg/m   Appears  well nourished, well groomed  and in no distress.  Eyes: PERRLA, EOMs, conjunctiva no swelling or erythema. Sinuses: No frontal/maxillary tenderness ENT/Mouth: EAC's clear, TM's nl w/o erythema, bulging. Nares clear w/o erythema, swelling, exudates. Oropharynx clear without erythema or exudates. Oral hygiene is good. Tongue normal, non obstructing. Hearing intact.  Neck: Supple. Thyroid not palpable. Car 2+/2+ without bruits, nodes or JVD. Chest: Respirations nl with BS clear & equal w/o rales, rhonchi, wheezing or stridor.  Cor: Heart sounds normal w/ regular rate and rhythm without sig.  murmurs, gallops, clicks or rubs. Peripheral pulses normal and equal  without edema.  Abdomen: Soft & bowel sounds normal. Non-tender w/o guarding, rebound, hernias, masses or organomegaly.  Lymphatics: Unremarkable.  Musculoskeletal: Full ROM all peripheral extremities, joint stability, 5/5 strength and normal gait.  Skin: Warm, dry without exposed rashes, lesions or ecchymosis apparent.  Neuro: Cranial nerves intact, reflexes equal bilaterally. Sensory-motor testing grossly intact. Tendon reflexes grossly intact.  Pysch: Alert & oriented x 3.  Insight and judgement nl & appropriate. No ideations.  Assessment and Plan:  1. Essential hypertension  - Continue medication, monitor blood pressure at home.  - Continue DASH diet.  Reminder to go to the ER if any CP,  SOB, nausea, dizziness, severe HA, changes vision/speech.  - CBC with Differential/Platelet - COMPLETE METABOLIC PANEL WITH GFR - Magnesium - TSH  2. Hyperlipidemia, mixed  - Continue diet/meds, exercise,& lifestyle modifications.  - Continue monitor periodic cholesterol/liver & renal functions   - Lipid panel - TSH  3. Abnormal glucose  - Continue diet, exercise  - Lifestyle modifications.  - Monitor appropriate labs.  - Hemoglobin A1c - Insulin, random  4. Vitamin D deficiency  - Continue supplementation.  - VITAMIN D 25 Hydroxyl  5. Gout  - Uric acid  6. Chronic kidney disease (CKD), stage III (moderate) (HCC)  - COMPLETE METABOLIC PANEL WITH GFR  7. Gastroesophageal reflux diseasel  - CBC with Differential/Platelet  8. Atherosclerosis of coronary artery bypass graft of native heart without angina pectoris  - Lipid panel  9. Medication management  -  CBC with Differential/Platelet - COMPLETE METABOLIC PANEL WITH GFR - Magnesium - Lipid panel - TSH - Hemoglobin A1c - Insulin, random - VITAMIN D 25 Hydroxy - Uric acid       Discussed  regular exercise, BP monitoring, weight control to  achieve/maintain BMI less than 25 and discussed med and SE's. Recommended labs to assess and monitor clinical status with further disposition pending results of labs. I discussed the assessment and treatment plan with the patient. The patient was provided an opportunity to ask questions and all were answered. The patient agreed with the plan and demonstrated an understanding of the instructions. I provided  minutes of non-face-to-face time during this encounter and over 40 minutes of exam, counseling, chart review and  complex critical decision making was performed   Kirtland Bouchard, MD

## 2018-11-28 NOTE — Patient Instructions (Signed)

## 2018-11-29 ENCOUNTER — Inpatient Hospital Stay: Payer: Medicare Other

## 2018-11-29 ENCOUNTER — Other Ambulatory Visit: Payer: Self-pay

## 2018-11-29 ENCOUNTER — Inpatient Hospital Stay (HOSPITAL_BASED_OUTPATIENT_CLINIC_OR_DEPARTMENT_OTHER): Payer: Medicare Other | Admitting: Physician Assistant

## 2018-11-29 VITALS — BP 117/72 | HR 74 | Temp 98.3°F | Resp 18 | Ht 67.0 in | Wt 203.0 lb

## 2018-11-29 DIAGNOSIS — Z79899 Other long term (current) drug therapy: Secondary | ICD-10-CM | POA: Diagnosis not present

## 2018-11-29 DIAGNOSIS — Z5111 Encounter for antineoplastic chemotherapy: Secondary | ICD-10-CM

## 2018-11-29 DIAGNOSIS — C3492 Malignant neoplasm of unspecified part of left bronchus or lung: Secondary | ICD-10-CM

## 2018-11-29 DIAGNOSIS — R531 Weakness: Secondary | ICD-10-CM | POA: Diagnosis not present

## 2018-11-29 DIAGNOSIS — R0609 Other forms of dyspnea: Secondary | ICD-10-CM | POA: Diagnosis not present

## 2018-11-29 DIAGNOSIS — Z5112 Encounter for antineoplastic immunotherapy: Secondary | ICD-10-CM

## 2018-11-29 DIAGNOSIS — C3412 Malignant neoplasm of upper lobe, left bronchus or lung: Secondary | ICD-10-CM

## 2018-11-29 DIAGNOSIS — K1379 Other lesions of oral mucosa: Secondary | ICD-10-CM | POA: Diagnosis not present

## 2018-11-29 DIAGNOSIS — C7951 Secondary malignant neoplasm of bone: Secondary | ICD-10-CM

## 2018-11-29 DIAGNOSIS — R5383 Other fatigue: Secondary | ICD-10-CM | POA: Diagnosis not present

## 2018-11-29 DIAGNOSIS — Z85528 Personal history of other malignant neoplasm of kidney: Secondary | ICD-10-CM

## 2018-11-29 DIAGNOSIS — Z95828 Presence of other vascular implants and grafts: Secondary | ICD-10-CM

## 2018-11-29 LAB — COMPLETE METABOLIC PANEL WITHOUT GFR
AG Ratio: 1.4 (calc) (ref 1.0–2.5)
ALT: 29 U/L (ref 9–46)
AST: 26 U/L (ref 10–35)
Albumin: 4 g/dL (ref 3.6–5.1)
Alkaline phosphatase (APISO): 76 U/L (ref 35–144)
BUN: 19 mg/dL (ref 7–25)
CO2: 25 mmol/L (ref 20–32)
Calcium: 9.4 mg/dL (ref 8.6–10.3)
Chloride: 95 mmol/L — ABNORMAL LOW (ref 98–110)
Creat: 1.1 mg/dL (ref 0.70–1.18)
GFR, Est African American: 76 mL/min/1.73m2
GFR, Est Non African American: 65 mL/min/1.73m2
Globulin: 2.9 g/dL (ref 1.9–3.7)
Glucose, Bld: 104 mg/dL — ABNORMAL HIGH (ref 65–99)
Potassium: 4.1 mmol/L (ref 3.5–5.3)
Sodium: 131 mmol/L — ABNORMAL LOW (ref 135–146)
Total Bilirubin: 0.4 mg/dL (ref 0.2–1.2)
Total Protein: 6.9 g/dL (ref 6.1–8.1)

## 2018-11-29 LAB — CBC WITH DIFFERENTIAL/PLATELET
Absolute Monocytes: 449 {cells}/uL (ref 200–950)
Basophils Absolute: 20 {cells}/uL (ref 0–200)
Basophils Relative: 0.6 %
Eosinophils Absolute: 61 {cells}/uL (ref 15–500)
Eosinophils Relative: 1.8 %
HCT: 40.1 % (ref 38.5–50.0)
Hemoglobin: 14 g/dL (ref 13.2–17.1)
Lymphs Abs: 1462 {cells}/uL (ref 850–3900)
MCH: 32.3 pg (ref 27.0–33.0)
MCHC: 34.9 g/dL (ref 32.0–36.0)
MCV: 92.4 fL (ref 80.0–100.0)
MPV: 9.3 fL (ref 7.5–12.5)
Monocytes Relative: 13.2 %
Neutro Abs: 1408 {cells}/uL — ABNORMAL LOW (ref 1500–7800)
Neutrophils Relative %: 41.4 %
Platelets: 135 Thousand/uL — ABNORMAL LOW (ref 140–400)
RBC: 4.34 Million/uL (ref 4.20–5.80)
RDW: 15.5 % — ABNORMAL HIGH (ref 11.0–15.0)
Total Lymphocyte: 43 %
WBC: 3.4 Thousand/uL — ABNORMAL LOW (ref 3.8–10.8)

## 2018-11-29 LAB — CBC WITH DIFFERENTIAL (CANCER CENTER ONLY)
Abs Immature Granulocytes: 0.01 10*3/uL (ref 0.00–0.07)
Basophils Absolute: 0 10*3/uL (ref 0.0–0.1)
Basophils Relative: 1 %
Eosinophils Absolute: 0.1 10*3/uL (ref 0.0–0.5)
Eosinophils Relative: 2 %
HCT: 39.3 % (ref 39.0–52.0)
Hemoglobin: 13.3 g/dL (ref 13.0–17.0)
Immature Granulocytes: 0 %
Lymphocytes Relative: 38 %
Lymphs Abs: 1.3 10*3/uL (ref 0.7–4.0)
MCH: 31.8 pg (ref 26.0–34.0)
MCHC: 33.8 g/dL (ref 30.0–36.0)
MCV: 94 fL (ref 80.0–100.0)
Monocytes Absolute: 0.5 10*3/uL (ref 0.1–1.0)
Monocytes Relative: 15 %
Neutro Abs: 1.5 10*3/uL — ABNORMAL LOW (ref 1.7–7.7)
Neutrophils Relative %: 44 %
Platelet Count: 122 10*3/uL — ABNORMAL LOW (ref 150–400)
RBC: 4.18 MIL/uL — ABNORMAL LOW (ref 4.22–5.81)
RDW: 15.8 % — ABNORMAL HIGH (ref 11.5–15.5)
WBC Count: 3.5 10*3/uL — ABNORMAL LOW (ref 4.0–10.5)
nRBC: 0 % (ref 0.0–0.2)

## 2018-11-29 LAB — URIC ACID: Uric Acid, Serum: 7.9 mg/dL (ref 4.0–8.0)

## 2018-11-29 LAB — CMP (CANCER CENTER ONLY)
ALT: 32 U/L (ref 0–44)
AST: 30 U/L (ref 15–41)
Albumin: 3.2 g/dL — ABNORMAL LOW (ref 3.5–5.0)
Alkaline Phosphatase: 80 U/L (ref 38–126)
Anion gap: 11 (ref 5–15)
BUN: 22 mg/dL (ref 8–23)
CO2: 25 mmol/L (ref 22–32)
Calcium: 9.3 mg/dL (ref 8.9–10.3)
Chloride: 97 mmol/L — ABNORMAL LOW (ref 98–111)
Creatinine: 1.17 mg/dL (ref 0.61–1.24)
GFR, Est AFR Am: >60 mL/min (ref >60)
GFR, Estimated: >60 mL/min (ref >60)
Glucose, Bld: 98 mg/dL (ref 70–99)
Potassium: 4 mmol/L (ref 3.5–5.1)
Sodium: 133 mmol/L — ABNORMAL LOW (ref 135–145)
Total Bilirubin: 0.4 mg/dL (ref 0.3–1.2)
Total Protein: 7.3 g/dL (ref 6.5–8.1)

## 2018-11-29 LAB — HEMOGLOBIN A1C
Hgb A1c MFr Bld: 5.7 % of total Hgb — ABNORMAL HIGH (ref <5.7)
Mean Plasma Glucose: 117 (calc)
eAG (mmol/L): 6.5 (calc)

## 2018-11-29 LAB — LIPID PANEL
Cholesterol: 219 mg/dL — ABNORMAL HIGH
HDL: 40 mg/dL
LDL Cholesterol (Calc): 135 mg/dL — ABNORMAL HIGH
Non-HDL Cholesterol (Calc): 179 mg/dL — ABNORMAL HIGH
Total CHOL/HDL Ratio: 5.5 (calc) — ABNORMAL HIGH
Triglycerides: 287 mg/dL — ABNORMAL HIGH

## 2018-11-29 LAB — MAGNESIUM: Magnesium: 1.7 mg/dL (ref 1.5–2.5)

## 2018-11-29 LAB — VITAMIN D 25 HYDROXY (VIT D DEFICIENCY, FRACTURES): Vit D, 25-Hydroxy: 79 ng/mL (ref 30–100)

## 2018-11-29 LAB — TSH: TSH: 1.43 m[IU]/L (ref 0.40–4.50)

## 2018-11-29 LAB — INSULIN, RANDOM: Insulin: 55.8 u[IU]/mL — ABNORMAL HIGH

## 2018-11-29 MED ORDER — SODIUM CHLORIDE 0.9 % IV SOLN
480.0000 mg | Freq: Once | INTRAVENOUS | Status: AC
  Administered 2018-11-29: 480 mg via INTRAVENOUS
  Filled 2018-11-29: qty 48

## 2018-11-29 MED ORDER — CYANOCOBALAMIN 1000 MCG/ML IJ SOLN
INTRAMUSCULAR | Status: AC
  Filled 2018-11-29: qty 1

## 2018-11-29 MED ORDER — SODIUM CHLORIDE 0.9% FLUSH
10.0000 mL | INTRAVENOUS | Status: DC | PRN
  Administered 2018-11-29: 10 mL
  Filled 2018-11-29: qty 10

## 2018-11-29 MED ORDER — CYANOCOBALAMIN 1000 MCG/ML IJ SOLN
1000.0000 ug | Freq: Once | INTRAMUSCULAR | Status: AC
  Administered 2018-11-29: 1000 ug via INTRAMUSCULAR

## 2018-11-29 MED ORDER — SODIUM CHLORIDE 0.9 % IV SOLN
Freq: Once | INTRAVENOUS | Status: AC
  Administered 2018-11-29: 12:00:00 via INTRAVENOUS
  Filled 2018-11-29: qty 5

## 2018-11-29 MED ORDER — PALONOSETRON HCL INJECTION 0.25 MG/5ML
INTRAVENOUS | Status: AC
  Filled 2018-11-29: qty 5

## 2018-11-29 MED ORDER — PALONOSETRON HCL INJECTION 0.25 MG/5ML
0.2500 mg | Freq: Once | INTRAVENOUS | Status: AC
  Administered 2018-11-29: 0.25 mg via INTRAVENOUS

## 2018-11-29 MED ORDER — SODIUM CHLORIDE 0.9 % IV SOLN
1000.0000 mg | Freq: Once | INTRAVENOUS | Status: AC
  Administered 2018-11-29: 1000 mg via INTRAVENOUS
  Filled 2018-11-29: qty 40

## 2018-11-29 MED ORDER — SODIUM CHLORIDE 0.9 % IV SOLN
200.0000 mg | Freq: Once | INTRAVENOUS | Status: AC
  Administered 2018-11-29: 200 mg via INTRAVENOUS
  Filled 2018-11-29: qty 8

## 2018-11-29 MED ORDER — SODIUM CHLORIDE 0.9 % IV SOLN
Freq: Once | INTRAVENOUS | Status: AC
  Administered 2018-11-29: 12:00:00 via INTRAVENOUS
  Filled 2018-11-29: qty 250

## 2018-11-29 MED ORDER — HEPARIN SOD (PORK) LOCK FLUSH 100 UNIT/ML IV SOLN
500.0000 [IU] | Freq: Once | INTRAVENOUS | Status: AC | PRN
  Administered 2018-11-29: 500 [IU]
  Filled 2018-11-29: qty 5

## 2018-11-29 MED ORDER — SODIUM CHLORIDE 0.9% FLUSH
10.0000 mL | Freq: Once | INTRAVENOUS | Status: AC
  Administered 2018-11-29: 10 mL
  Filled 2018-11-29: qty 10

## 2018-11-29 NOTE — Progress Notes (Signed)
[facility] OFFICE PROGRESS NOTE  [name], [address] [place] [address]  DIAGNOSIS:  1)Stage IV (T1c, N0, M1 C) non-small cell lung cancer, adenocarcinoma presented with left upper lobe lung nodule in addition to metastatic disease to the bone diagnosed in April 2020. 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  Molecular studies by [facility] 567  STK11Splice Site SNV 0.1% Everolimus,Temsirolimus  [id number] 0.8% None (VUS),No (VUS)  NTRK3 L629L 1.5% (Synonymous)No (Synonymous)  KRAS G12D 1.1% Binimetinib  CDKN2A H83Y 1.6% Abemaciclib, Palbociclib, Ribociclib  PDL1 TPS was 0%  PRIOR THERAPY: None  CURRENT THERAPY: Systemic chemotherapy with carboplatin for AUC of 5, Alimta 500 mg/M2 and Keytruda 200 mg IV every 3 weeks. First dose Oct 16, 2018.Status post 2 cycles of treatment.  INTERVAL HISTORY: Darrell HerendeenSouthern 73y.o. male returns to the clinic for a follow-up visit.  The patient is feeling well today without any concerning complaints except for mild fatigue and mouth soreness.  The patient states that this is going on for approximately 1 week.  He was seen by his dentist about a week ago in which they recommend for him using chlorhexidine rinses.  He states this has helped somewhat.  He denies any fevers, lesions, ulcerations, or gingival bleeding.  Otherwise, he tolerated his last treatment well except for some generalized weakness.  He denies any fever, chills, night sweats, or weight loss.  He denies any chest pain, cough, or hemoptysis.  He endorses his usual baseline shortness of breath with exertion.  He denies any nausea, vomiting, diarrhea, or constipation.  He denies any headache visual changes. He denies any rashes or skin changes.  He is here today for evaluation before starting cycle 3.  MEDICAL HISTORY: Past Medical  History:  Diagnosis Date  . Anginal pain ([place]   . Anxiety    treated /w Xanax for mild depression , using 2 times per day, not PRN  . Asthma   . Benign prostatic hypertrophy   . CAD (coronary artery disease)    pt last left heart cath was in Nov 2008. EF was 55% on left ventriculogram. Circumflex was totally occluded after the 1st obtuse marginal with collaterals supplying the distal circumflex. LAD showed luminal irregularities. The stent in LAD was patent. The RCA showed luminal irregularities. The stent in th emid RCA and PDA were patent. There were no inverventions.   . Chest pain    from anxiety last time 1 month ago  . Chromophobe renal cell carcinoma ([place] 02[place]   s/p left kidney wedge resection  . Chronic obstructive pulmonary disease (HCC)    asthma  . Cluster headache    relative to sinus problems none recnt  . Constipation   . Depression   . DM2 (diabetes mellitus, type 2) ([place]    well with diet and exercise controlled  . GERD (gastroesophageal reflux disease)    hiatal hernia. Patient did have a Nissen fundoplication rare  . Heart murmur    heard 30 yrs ago  . History of blood transfusion    need for Bld. transfusion relative to taking NSAIDS  . HTN (hypertension)    pt. followed by [facility], last cardiac visit 2012, one yr. ago  . Hyperlipidemia   . Joint pain   . Lactose intolerance   . Left kidney mass   . Low back pain    chronic  .  Metastatic non-small cell lung cancer (HCC)    adenocarcinoma, LUL, bone metastasis in 09/2018  . Neuromuscular disorder ([facility])    nerve involvement in back & upper back relative to hardware in neck   . Obesity   . OSA and COPD overlap syndrome ([place]) 02/25/2015   no cpap used pt denies sleep apnea  . Osteoarthritis   . Peptic ulcer disease   . Pneumonia not recent per pt  . 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 Sleep concern    states the study (2003 )was done at  [facility]., it failed & he was told to return & he never did. Pt. states he has been found by prev. hosp. staff that he has to be told when to breathe & his wife does the same.   Marland Kitchen Spinal fracture    hx of traumatic in Jun 04, 2007 after falling off the roof  . Tinnitus, bilateral   . Vitamin D deficiency     ALLERGIES:  is allergic to codeine; meloxicam; morphine and related; nsaids; oxycodone; crestor [rosuvastatin]; cymbalta [duloxetine hcl]; dilaudid [hydromorphone hcl]; effexor [venlafaxine]; gluten meal; topamax [topiramate]; tramadol hcl; trazodone and nefazodone; adhesive [tape]; penicillins; and sulfa antibiotics.  MEDICATIONS:  Current Outpatient Medications  Medication Sig Dispense Refill  . acetaminophen (TYLENOL) 500 MG tablet Take 1 tablet (500 mg total) by mouth every 6 (six) hours as needed. 30 tablet 0  . Alpha-Lipoic Acid 200 MG CAPS Take by mouth 2 (two) times daily.    . Ascorbic Acid (VITAMIN C) 1000 MG tablet Take 1,000 mg by mouth 2 (two) times daily.    . bisoprolol-hydrochlorothiazide (ZIAC) 5-6.25 MG tablet Take 1 tablet by mouth daily. 90 tablet 1  . Blood Glucose Monitoring Suppl (FREESTYLE FREEDOM LITE) w/Device KIT Check blood sugar 1 time a day. DX-E11.21 1 each 0  . Cholecalciferol (VITAMIN D3) 5000 units CAPS Take 5,000 Units by mouth 2 (two) times daily.    . CHOLINE PO Take 600 mg by mouth daily.    . clopidogrel (PLAVIX) 75 MG tablet Take 1 tablet (75 mg total) by mouth daily after breakfast. 90 tablet 1  . diazepam (VALIUM) 5 MG tablet TAKE 1/2 TO 1 TABLET BY MOUTH TWICE DAILY ONLY IF NEEDED FOR ANXIETY ATTACK AND LIMIT TO 5 DAYS A WEEK TO AVOID ADDICTION 60 tablet 0  . DOXYLAMINE SUCCINATE PO Take 0.5 tablets by mouth at bedtime.     . folic acid (FOLVITE) 1 MG tablet Take 1 tablet (1 mg total) by mouth daily. 30 tablet 4  . ipratropium (ATROVENT) 0.03 % nasal spray Place 1-2 sprays into both nostrils daily as needed (for allergies.). 30 mL 3  .  lidocaine-prilocaine (EMLA) cream Apply 1 application topically as needed. 30 g 0  . Magnesium 500 MG TABS Take 400 mg by mouth at bedtime. Take 472m QID    . MELATONIN PO Take 30 mg by mouth at bedtime.    . Multiple Vitamins-Minerals (MULTIVITAMIN ADULT PO) Take by mouth daily.    . nitroGLYCERIN (NITROSTAT) 0.4 MG SL tablet Place 0.4 mg under the tongue every 5 (five) minutes x 3 doses as needed for chest pain.    .Marland KitchenOVER THE COUNTER MEDICATION Taking OTC Iron 1 tablet daily.    . prochlorperazine (COMPAZINE) 10 MG tablet Take 1 tablet (10 mg total) by mouth every 6 (six) hours as needed for nausea or vomiting. 3[place]  tablet 0  . SUPER B COMPLEX/C PO Take by mouth daily.    . tamsulosin (FLOMAX) 0.4 MG CAPS capsule Take 1 capsule Daily for for Prostate 90 capsule 3  . tetrahydrozoline-zinc (VISINE-AC) 0.05-0.25 % ophthalmic solution Place 1-2 drops into both eyes 3 (three) times daily as needed (for redness/irritation.).    Marland Kitchen triamcinolone ointment (KENALOG) 0.1 % Apply 1 application topically 2 (two) times daily. 80 g 1  . Turmeric 500 MG TABS Take by mouth. Takes 3 in the morning and 3 in the evening    . VALERIAN PO Take 400 mg by mouth daily.     No current facility-administered medications for this visit.     SURGICAL HISTORY:  Past Surgical History:  Procedure Laterality Date  . BACK SURGERY     x3- last surgery lumbar- 1998- / fusion   . blepheroplasty     both eyesx2  . C5-T6 posterior fusion     with [place] and radius screws  . [place], 2008 & 2012 multiple stents  total 4 times  . cataracts     cataracts removed- /w IOL - both eyes   . CERVICAL SPINE SURGERY    . COLONOSCOPY    . ESOPHAGEAL MANOMETRY    . HARDWARE REMOVAL  02/20/2012   Procedure: HARDWARE REMOVAL;  Surgeon: [name], MD;  Location: [facility];  Service: Neurosurgery;  Laterality: N/A;  Hardware Removal  . HIATAL HERNIA REPAIR  1979, spring 2009  . IR IMAGING GUIDED PORT  INSERTION  10/29/2018  . LAPAROSCOPIC CHOLECYSTECTOMY     & IOC  . multiple percutaneous coronary interventions    . NASAL SINUS SURGERY     x2, sees Dr. [name], still having problems, states he uses Benadryl PRN- up to 5 times per day   . right temporal artery biopsy    . right wrist plate insertion  [id number]/0768  . ROBOTIC ASSITED PARTIAL NEPHRECTOMY Left 03/02/2018   Procedure: XI ROBOTIC ASSITED LEFT PARTIAL NEPHRECTOMY WITH LYSIS OF ADHESIONS X30 MINUTES.;  Surgeon: Ardis Hughs, MD;  Location: [facility];  Service: Urology;  Laterality: Left;  CLAMP TIME FOR KIDNEY 1053-1110 TOTAL 18MINUTES  . Rotator cuff surgery Right   . SKIN BIOPSY Right 10/31/2017   Chest  . TRANSURETHRAL RESECTION OF BLADDER TUMOR N/A 11/03/2017   Procedure: cystoscopy;  Surgeon: Kathie Rhodes, MD;  Location: [facility];  Service: Urology;  Laterality: N/A;  . UMBILICAL HERNIA REPAIR    . UPPER GI ENDOSCOPY    . VENTRAL HERNIA REPAIR N/A 03/02/2018   Procedure: LAPAROSCOPIC REPAIR OF INCARCERATED INCISIONAL HERNIA WITH LYSIS OF ADHESIONS;  Surgeon: Michael Boston, MD;  Location: [facility];  Service: General;  Laterality: N/A;    REVIEW OF SYSTEMS:   Review of Systems  Constitutional: Positive for mild fatigue and generalized weakness. Negative for appetite change, chills, fever and unexpected weight change.  HENT: Positive for gingival sensitivity without any mouth sores, gingival bleeding, nosebleeds, sore throat and trouble swallowing.   Eyes: Negative for eye problems and icterus.  Respiratory: Positive for baseline shortness of breath with exertion. Negative for cough, hemoptysis, and wheezing.   Cardiovascular: Negative for chest pain and leg swelling.  Gastrointestinal: Negative for abdominal pain, constipation, diarrhea, nausea and vomiting.  Genitourinary: Negative for bladder incontinence, difficulty urinating, dysuria, frequency and hematuria.   Musculoskeletal: Negative for back pain, gait problem, neck pain and  neck stiffness.  Skin: Negative for itching and rash.  Neurological: Negative for dizziness, extremity weakness, gait problem, headaches, light-headedness and seizures.  Hematological: Negative for adenopathy. Does not bruise/bleed easily.  Psychiatric/Behavioral: Negative for confusion, depression and sleep disturbance. The patient is not nervous/anxious.     PHYSICAL EXAMINATION:  Blood pressure 117/72, pulse 74, temperature 98.3 F (36.8 C), temperature source Oral, resp. rate 18, height 5' 7"  (1.702 m), weight 203 lb (92.1 kg), SpO2 98 %.  ECOG PERFORMANCE STATUS: 1 - Symptomatic but completely ambulatory  Physical Exam  Constitutional: Oriented to person, place, and time and well-developed, well-nourished, and in no distress.  HENT:  Head: Normocephalic and atraumatic.  Mouth/Throat: Oropharynx is clear and moist. No oropharyngeal exudate.  Eyes: Conjunctivae are normal. Right eye exhibits no discharge. Left eye exhibits no discharge. No scleral icterus.  Neck: Normal range of motion. Neck supple.  Cardiovascular: Normal rate, regular rhythm, normal heart sounds and intact distal pulses.   Pulmonary/Chest: Effort normal and breath sounds normal. No respiratory distress. No wheezes. No rales.  Abdominal: Soft. Bowel sounds are normal. Exhibits no distension and no mass. There is no tenderness.  Musculoskeletal: Normal range of motion. Exhibits no edema.  Lymphadenopathy:    No cervical adenopathy.  Neurological: Alert and oriented to person, place, and time. Exhibits normal muscle tone. Gait normal. Coordination normal.  Skin: Skin is warm and dry. No rash noted. Not diaphoretic. No erythema. No pallor.  Psychiatric: Mood, memory and judgment normal.  Vitals reviewed.  LABORATORY DATA: Lab Results  Component Value Date   WBC 3.5 (L) 11/29/2018   HGB 13.3 11/29/2018   HCT 39.3 11/29/2018   MCV 94.0 11/29/2018   PLT 122 (L) 11/29/2018      Chemistry      Component Value  Date/Time   NA 133 (L) 11/29/2018 1010   K 4.0 11/29/2018 1010   CL 97 (L) 11/29/2018 1010   CO2 25 11/29/2018 1010   BUN 22 11/29/2018 1010   CREATININE 1.17 11/29/2018 1010   CREATININE 1.10 11/28/2018 0959      Component Value Date/Time   CALCIUM 9.3 11/29/2018 1010   ALKPHOS 80 11/29/2018 1010   AST 30 11/29/2018 1010   ALT 32 11/29/2018 1010   BILITOT 0.4 11/29/2018 1010       RADIOGRAPHIC STUDIES:  No results found.   ASSESSMENT/PLAN:  This is a very pleasant 73 year old Caucasian male diagnosed with stage IV non-small cell lung cancer, adenocarcinoma.  He presented with a left upper lobe lung nodule in addition to metastatic bone disease.  He was diagnosed in April 2020.  His PDL 1 expression is negative and he has no actionable mutations.  He also has a history of renal cell carcinoma.  He is status post wedge resection to the left kidney which was performed in September 2019.  The patient is currently undergoing palliative systemic chemotherapy with carboplatin for an AUC of 5, Alimta 500 mg/m, and Keytruda 200 mg IV every 3 weeks.  The patient is status post 2 cycles of treatment.  He tolerated his treatment well without any adverse effects except for some mild fatigue.  The patient was seen with Dr. Julien Nordmann today.  Labs were reviewed with the patient.  We recommend he proceed with cycle #3 today scheduled.  I will arrange for restaging CT scan of the chest, abdomen, and pelvis to be performed prior to his next visit.  I will see the patient back for follow-up visit in 3 weeks for evaluation and to review his scan results before  starting cycle #4.   The patient was advised that he may use salt water rinses and biotin for the sensitivity of his gums and to please contact our office should he develop new or worsening symptoms.   The patient was advised to call immediately if he has any concerning symptoms in the interval. The patient voices understanding of current  disease status and treatment options and is in agreement with the current care plan. All questions were answered. The patient knows to call the clinic with any problems, questions or concerns. We can certainly see the patient much sooner if necessary   Orders Placed This Encounter  Procedures  . CT Chest W Contrast    Standing Status:   Future    Standing Expiration Date:   11/29/2019    Order Specific Question:   ** REASON FOR EXAM (FREE TEXT)    Answer:   Restaging Lung cancer    Order Specific Question:   If indicated for the ordered procedure, I authorize the administration of contrast media per Radiology protocol    Answer:   Yes    Order Specific Question:   Preferred imaging location?    Answer:   [facility]    Order Specific Question:   Radiology Contrast Protocol - do NOT remove file path    Answer:   \\[email or web address]  . CT Abdomen Pelvis W Contrast    Standing Status:   Future    Standing Expiration Date:   11/29/2019    Order Specific Question:   ** REASON FOR EXAM (FREE TEXT)    Answer:   Restaging Lung Cancer    Order Specific Question:   If indicated for the ordered procedure, I authorize the administration of contrast media per Radiology protocol    Answer:   Yes    Order Specific Question:   Preferred imaging location?    Answer:   [facility]    Order Specific Question:   Is Oral Contrast requested for this exam?    Answer:   Yes, Per Radiology protocol    Order Specific Question:   Radiology Contrast Protocol - do NOT remove file path    Answer:   \\[email or web address]     Darrell Barmore L Lashell Moffitt, PA-C 11/29/18  ADDENDUM: Hematology/Oncology Attending: I had a face-to-face encounter with the patient.  I recommended his care plan.  This is a very pleasant 73 years old white male with metastatic non-small cell lung cancer, adenocarcinoma with no actionable mutation and he is currently undergoing  systemic chemotherapy with carboplatin, Alimta and Keytruda status post 2 cycles.  The patient has been tolerating his treatment well with no concerning adverse effects. I recommended for him to proceed with cycle #3 today as planned. We will see him back for follow-up visit in 3 weeks for evaluation with repeat CT scan of the chest, abdomen and pelvis for restaging of his disease. The patient was advised to call immediately if he has any concerning symptoms in the interval.  Disclaimer: This note was dictated with voice recognition software. Similar sounding words can inadvertently be transcribed and may be missed upon review. Eilleen Kempf, MD 11/30/18

## 2018-11-29 NOTE — Patient Instructions (Signed)
[facility] Discharge Instructions for Patients Receiving Chemotherapy  Today you received the following chemotherapy agents: Keytruda, Alimta, and Carboplatin.  To help prevent nausea and vomiting after your treatment, we encourage you to take your nausea medication as prescribed.   If you develop nausea and vomiting that is not controlled by your nausea medication, call the clinic.   BELOW ARE SYMPTOMS THAT SHOULD BE REPORTED IMMEDIATELY:  *FEVER GREATER THAN 100.5 F  *CHILLS WITH OR WITHOUT FEVER  NAUSEA AND VOMITING THAT IS NOT CONTROLLED WITH YOUR NAUSEA MEDICATION  *UNUSUAL SHORTNESS OF BREATH  *UNUSUAL BRUISING OR BLEEDING  TENDERNESS IN MOUTH AND THROAT WITH OR WITHOUT PRESENCE OF ULCERS  *URINARY PROBLEMS  *BOWEL PROBLEMS  UNUSUAL RASH Items with * indicate a potential emergency and should be followed up as soon as possible.  Feel free to call the clinic should you have any questions or concerns. The clinic phone number is [phone].  Please show the [facility] at check-in to the Emergency Department and triage nurse.   Pembrolizumab injection Beryle Flock) What is this medicine? PEMBROLIZUMAB (pem broe liz ue mab) is a monoclonal antibody. It is used to treat cervical cancer, esophageal cancer, head and neck cancer, hepatocellular cancer, Hodgkin lymphoma, kidney cancer, lymphoma, melanoma, Merkel cell carcinoma, lung cancer, stomach cancer, urothelial cancer, and cancers that have a certain genetic condition. This medicine may be used for other purposes; ask your health care provider or pharmacist if you have questions. COMMON BRAND NAME(S): Keytruda What should I tell my health care provider before I take this medicine? They need to know if you have any of these conditions: -diabetes -immune system problems -inflammatory bowel disease -liver disease -lung or breathing disease -lupus -received or scheduled to receive an organ transplant  or a stem-cell transplant that uses donor stem cells -an unusual or allergic reaction to pembrolizumab, other medicines, foods, dyes, or preservatives -pregnant or trying to get pregnant -breast-feeding How should I use this medicine? This medicine is for infusion into a vein. It is given by a health care professional in a hospital or clinic setting. A special MedGuide will be given to you before each treatment. Be sure to read this information carefully each time. Talk to your pediatrician regarding the use of this medicine in children. While this drug may be prescribed for selected conditions, precautions do apply. Overdosage: If you think you have taken too much of this medicine contact a poison control center or emergency room at once. NOTE: This medicine is only for you. Do not share this medicine with others. What if I miss a dose? It is important not to miss your dose. Call your doctor or health care professional if you are unable to keep an appointment. What may interact with this medicine? Interactions have not been studied. Give your health care provider a list of all the medicines, herbs, non-prescription drugs, or dietary supplements you use. Also tell them if you smoke, drink alcohol, or use illegal drugs. Some items may interact with your medicine. This list may not describe all possible interactions. Give your health care provider a list of all the medicines, herbs, non-prescription drugs, or dietary supplements you use. Also tell them if you smoke, drink alcohol, or use illegal drugs. Some items may interact with your medicine. What should I watch for while using this medicine? Your condition will be monitored carefully while you are receiving this medicine. You may need blood work done while you are taking this medicine.  Do not become pregnant while taking this medicine or for 4 months after stopping it. Women should inform their doctor if they wish to become pregnant or think  they might be pregnant. There is a potential for serious side effects to an unborn child. Talk to your health care professional or pharmacist for more information. Do not breast-feed an infant while taking this medicine or for 4 months after the last dose. What side effects may I notice from receiving this medicine? Side effects that you should report to your doctor or health care professional as soon as possible: -allergic reactions like skin rash, itching or hives, swelling of the face, lips, or tongue -bloody or black, tarry -breathing problems -changes in vision -chest pain -chills -confusion -constipation -cough -diarrhea -dizziness or feeling faint or lightheaded -fast or irregular heartbeat -fever -flushing -hair loss -joint pain -low blood counts - this medicine may decrease the number of white blood cells, red blood cells and platelets. You may be at increased risk for infections and bleeding. -muscle pain -muscle weakness -persistent headache -redness, blistering, peeling or loosening of the skin, including inside the mouth -signs and symptoms of high blood sugar such as dizziness; dry mouth; dry skin; fruity breath; nausea; stomach pain; increased hunger or thirst; increased urination -signs and symptoms of kidney injury like trouble passing urine or change in the amount of urine -signs and symptoms of liver injury like dark urine, light-colored stools, loss of appetite, nausea, right upper belly pain, yellowing of the eyes or skin -sweating -swollen lymph nodes -weight loss Side effects that usually do not require medical attention (report to your doctor or health care professional if they continue or are bothersome): -decreased appetite -muscle pain -tiredness This list may not describe all possible side effects. Call your doctor for medical advice about side effects. You may report side effects to FDA at 1-800-FDA-1088. Where should I keep my medicine? This drug is  given in a hospital or clinic and will not be stored at home. NOTE: This sheet is a summary. It may not cover all possible information. If you have questions about this medicine, talk to your doctor, pharmacist, or health care provider.  2019 Elsevier/Gold Standard (2018-01-11 15:06:10)  Pemetrexed injection (Alimta) What is this medicine? PEMETREXED (PEM e TREX ed) is a chemotherapy drug used to treat lung cancers like non-small cell lung cancer and mesothelioma. It may also be used to treat other cancers. This medicine may be used for other purposes; ask your health care provider or pharmacist if you have questions. COMMON BRAND NAME(S): Alimta What should I tell my health care provider before I take this medicine? They need to know if you have any of these conditions: -infection (especially a virus infection such as chickenpox, cold sores, or herpes) -kidney disease -low blood counts, like low white cell, platelet, or red cell counts -lung or breathing disease, like asthma -radiation therapy -an unusual or allergic reaction to pemetrexed, other medicines, foods, dyes, or preservative -pregnant or trying to get pregnant -breast-feeding How should I use this medicine? This drug is given as an infusion into a vein. It is administered in a hospital or clinic by a specially trained health care professional. Talk to your pediatrician regarding the use of this medicine in children. Special care may be needed. Overdosage: If you think you have taken too much of this medicine contact a poison control center or emergency room at once. NOTE: This medicine is only for you. Do not share this  medicine with others. What if I miss a dose? It is important not to miss your dose. Call your doctor or health care professional if you are unable to keep an appointment. What may interact with this medicine? This medicine may interact with the following medications: -Ibuprofen This list may not describe all  possible interactions. Give your health care provider a list of all the medicines, herbs, non-prescription drugs, or dietary supplements you use. Also tell them if you smoke, drink alcohol, or use illegal drugs. Some items may interact with your medicine. What should I watch for while using this medicine? Visit your doctor for checks on your progress. This drug may make you feel generally unwell. This is not uncommon, as chemotherapy can affect healthy cells as well as cancer cells. Report any side effects. Continue your course of treatment even though you feel ill unless your doctor tells you to stop. In some cases, you may be given additional medicines to help with side effects. Follow all directions for their use. Call your doctor or health care professional for advice if you get a fever, chills or sore throat, or other symptoms of a cold or flu. Do not treat yourself. This drug decreases your body's ability to fight infections. Try to avoid being around people who are sick. This medicine may increase your risk to bruise or bleed. Call your doctor or health care professional if you notice any unusual bleeding. Be careful brushing and flossing your teeth or using a toothpick because you may get an infection or bleed more easily. If you have any dental work done, tell your dentist you are receiving this medicine. Avoid taking products that contain aspirin, acetaminophen, ibuprofen, naproxen, or ketoprofen unless instructed by your doctor. These medicines may hide a fever. Call your doctor or health care professional if you get diarrhea or mouth sores. Do not treat yourself. To protect your kidneys, drink water or other fluids as directed while you are taking this medicine. Do not become pregnant while taking this medicine or for 6 months after stopping it. Women should inform their doctor if they wish to become pregnant or think they might be pregnant. Men should not father a child while taking this  medicine and for 3 months after stopping it. This may interfere with the ability to father a child. You should talk to your doctor or health care professional if you are concerned about your fertility. There is a potential for serious side effects to an unborn child. Talk to your health care professional or pharmacist for more information. Do not breast-feed an infant while taking this medicine or for 1 week after stopping it. What side effects may I notice from receiving this medicine? Side effects that you should report to your doctor or health care professional as soon as possible: -allergic reactions like skin rash, itching or hives, swelling of the face, lips, or tongue -breathing problems -redness, blistering, peeling or loosening of the skin, including inside the mouth -signs and symptoms of bleeding such as bloody or black, tarry stools; red or dark-brown urine; spitting up blood or brown material that looks like coffee grounds; red spots on the skin; unusual bruising or bleeding from the eye, gums, or nose -signs and symptoms of infection like fever or chills; cough; sore throat; pain or trouble passing urine -signs and symptoms of kidney injury like trouble passing urine or change in the amount of urine -signs and symptoms of liver injury like dark yellow or brown urine; general  ill feeling or flu-like symptoms; light-colored stools; loss of appetite; nausea; right upper belly pain; unusually weak or tired; yellowing of the eyes or skin Side effects that usually do not require medical attention (report to your doctor or health care professional if they continue or are bothersome): -constipation -mouth sores -nausea, vomiting -unusually weak or tired This list may not describe all possible side effects. Call your doctor for medical advice about side effects. You may report side effects to FDA at 1-800-FDA-1088. Where should I keep my medicine? This drug is given in a hospital or clinic and  will not be stored at home. NOTE: This sheet is a summary. It may not cover all possible information. If you have questions about this medicine, talk to your doctor, pharmacist, or health care provider.  2019 [employer]vier/Gold Standard (2017-07-19 16:11:33)  Carboplatin injection What is this medicine? CARBOPLATIN (KAR boe pla tin) is a chemotherapy drug. It targets fast dividing cells, like cancer cells, and causes these cells to die. This medicine is used to treat ovarian cancer and many other cancers. This medicine may be used for other purposes; ask your health care provider or pharmacist if you have questions. COMMON BRAND NAME(S): Paraplatin What should I tell my health care provider before I take this medicine? They need to know if you have any of these conditions: -blood disorders -hearing problems -kidney disease -recent or ongoing radiation therapy -an unusual or allergic reaction to carboplatin, cisplatin, other chemotherapy, other medicines, foods, dyes, or preservatives -pregnant or trying to get pregnant -breast-feeding How should I use this medicine? This drug is usually given as an infusion into a vein. It is administered in a hospital or clinic by a specially trained health care professional. Talk to your pediatrician regarding the use of this medicine in children. Special care may be needed. Overdosage: If you think you have taken too much of this medicine contact a poison control center or emergency room at once. NOTE: This medicine is only for you. Do not share this medicine with others. What if I miss a dose? It is important not to miss a dose. Call your doctor or health care professional if you are unable to keep an appointment. What may interact with this medicine? -medicines for seizures -medicines to increase blood counts like filgrastim, pegfilgrastim, sargramostim -some antibiotics like amikacin, gentamicin, neomycin, streptomycin, tobramycin -vaccines Talk to  your doctor or health care professional before taking any of these medicines: -acetaminophen -aspirin -ibuprofen -ketoprofen -naproxen This list may not describe all possible interactions. Give your health care provider a list of all the medicines, herbs, non-prescription drugs, or dietary supplements you use. Also tell them if you smoke, drink alcohol, or use illegal drugs. Some items may interact with your medicine. What should I watch for while using this medicine? Your condition will be monitored carefully while you are receiving this medicine. You will need important blood work done while you are taking this medicine. This drug may make you feel generally unwell. This is not uncommon, as chemotherapy can affect healthy cells as well as cancer cells. Report any side effects. Continue your course of treatment even though you feel ill unless your doctor tells you to stop. In some cases, you may be given additional medicines to help with side effects. Follow all directions for their use. Call your doctor or health care professional for advice if you get a fever, chills or sore throat, or other symptoms of a cold or flu. Do not treat yourself.  This drug decreases your body's ability to fight infections. Try to avoid being around people who are sick. This medicine may increase your risk to bruise or bleed. Call your doctor or health care professional if you notice any unusual bleeding. Be careful brushing and flossing your teeth or using a toothpick because you may get an infection or bleed more easily. If you have any dental work done, tell your dentist you are receiving this medicine. Avoid taking products that contain aspirin, acetaminophen, ibuprofen, naproxen, or ketoprofen unless instructed by your doctor. These medicines may hide a fever. Do not become pregnant while taking this medicine. Women should inform their doctor if they wish to become pregnant or think they might be pregnant. There is a  potential for serious side effects to an unborn child. Talk to your health care professional or pharmacist for more information. Do not breast-feed an infant while taking this medicine. What side effects may I notice from receiving this medicine? Side effects that you should report to your doctor or health care professional as soon as possible: -allergic reactions like skin rash, itching or hives, swelling of the face, lips, or tongue -signs of infection - fever or chills, cough, sore throat, pain or difficulty passing urine -signs of decreased platelets or bleeding - bruising, pinpoint red spots on the skin, black, tarry stools, nosebleeds -signs of decreased red blood cells - unusually weak or tired, fainting spells, lightheadedness -breathing problems -changes in hearing -changes in vision -chest pain -high blood pressure -low blood counts - This drug may decrease the number of white blood cells, red blood cells and platelets. You may be at increased risk for infections and bleeding. -nausea and vomiting -pain, swelling, redness or irritation at the injection site -pain, tingling, numbness in the hands or feet -problems with balance, talking, walking -trouble passing urine or change in the amount of urine Side effects that usually do not require medical attention (report to your doctor or health care professional if they continue or are bothersome): -hair loss -loss of appetite -metallic taste in the mouth or changes in taste This list may not describe all possible side effects. Call your doctor for medical advice about side effects. You may report side effects to FDA at 1-800-FDA-1088. Where should I keep my medicine? This drug is given in a hospital or clinic and will not be stored at home. NOTE: This sheet is a summary. It may not cover all possible information. If you have questions about this medicine, talk to your doctor, pharmacist, or health care provider.  2019 Elsevier/Gold  Standard (2007-09-04 14:38:05)

## 2018-11-30 ENCOUNTER — Telehealth: Payer: Self-pay | Admitting: Physician Assistant

## 2018-11-30 ENCOUNTER — Encounter: Payer: Self-pay | Admitting: Physician Assistant

## 2018-11-30 NOTE — Telephone Encounter (Deleted)
Scheduled appt per 6/18 los.

## 2018-11-30 NOTE — Telephone Encounter (Signed)
Scheduled appt per 6/18 los.  Patient aware that I moved his lab and port appt from 7/9 to 7/7.

## 2018-12-04 ENCOUNTER — Telehealth: Payer: Self-pay | Admitting: *Deleted

## 2018-12-04 NOTE — Telephone Encounter (Signed)
    COVID-19 Pre-Screening Questions:  . In the past 7 to 10 days have you had a cough,  shortness of breath, headache, congestion, fever (100 or greater) body aches, chills, sore throat, or sudden loss of taste or sense of smell?-NO . Have you been around anyone with known Covid 19.-NO . Have you been around anyone who is awaiting Covid 19 test results in the past 7 to 10 days?-NO . Have you been around anyone who has been exposed to Covid 19, or has mentioned symptoms of Covid 19 within the past 7 to 10 days?-NO   Pt covid pre-screen questions were done and answered "no" to all.  Pt is aware of no visitor policy and to wear his face mask at all times to this appt.  Pt verbalized understanding and agrees with this plan.                 Marland Kitchen

## 2018-12-05 ENCOUNTER — Other Ambulatory Visit: Payer: Self-pay

## 2018-12-05 ENCOUNTER — Encounter: Payer: Self-pay | Admitting: Internal Medicine

## 2018-12-05 ENCOUNTER — Encounter: Payer: Self-pay | Admitting: *Deleted

## 2018-12-05 ENCOUNTER — Encounter: Payer: Self-pay | Admitting: Cardiology

## 2018-12-05 ENCOUNTER — Ambulatory Visit (INDEPENDENT_AMBULATORY_CARE_PROVIDER_SITE_OTHER): Payer: Medicare Other | Admitting: Cardiology

## 2018-12-05 VITALS — BP 120/68 | HR 71 | Ht 67.0 in | Wt 204.4 lb

## 2018-12-05 DIAGNOSIS — R0789 Other chest pain: Secondary | ICD-10-CM | POA: Diagnosis not present

## 2018-12-05 DIAGNOSIS — R079 Chest pain, unspecified: Secondary | ICD-10-CM

## 2018-12-05 DIAGNOSIS — I25810 Atherosclerosis of coronary artery bypass graft(s) without angina pectoris: Secondary | ICD-10-CM | POA: Diagnosis not present

## 2018-12-05 MED ORDER — NITROGLYCERIN 0.4 MG SL SUBL: SUBLINGUAL_TABLET | SUBLINGUAL | 3 refills | Status: AC

## 2018-12-05 NOTE — Progress Notes (Signed)
Cardiology Office Note    Date:  12/05/2018   ID:  Darrell, Mccullough [birth year], MRN [id number]  PCP:  [name], MD  Cardiologist: Ena Dawley, MD EPS: None  Chief complaint: Chest pain  History of Present Illness:  [name] is a 73 y.o. male with history of CAD with multiple stents in his LAD and RCA.  Last cath 12/2010 showed total occluded circumflex known from prior cast with collaterals from the RCA.  There was no other significant coronary disease EF 55%.  He is intolerant to statins.  Also has hypertension, HLD.  Patient just underwent robotic partial nephrectomy for left renal mass and repair of incisional hernia 03/02/2018. He was also diagnosed with Stage IV Metastatic, Nonsquamous, lung cancer, currently on Chemotherapy/Immunotherapy.  Lipid profile 02/16/2018 cholesterol 215 triglycerides 579 and non-HDL cholesterol 180 LDL not calculated.  He was seen in the lipid clinic and recommended to start Vascepa however cost was prohibitive. He has been overall feeling more short of breath however he attributes it to lung cancer and minimal ability to exercise with fatigue post chemotherapy.  But he is also noticed chest pains that are at rest however those are typical prior to his stenting in the past.  Past Medical History:  Diagnosis Date  . Anginal pain ([place])   . Anxiety    treated /w Xanax for mild depression , using 2 times per day, not PRN  . Asthma   . Benign prostatic hypertrophy   . CAD (coronary artery disease)    pt last left heart cath was in Nov 2008. EF was 55% on left ventriculogram. Circumflex was totally occluded after the 1st obtuse marginal with collaterals supplying the distal circumflex. LAD showed luminal irregularities. The stent in LAD was patent. The RCA showed luminal irregularities. The stent in th emid RCA and PDA were patent. There were no inverventions.   . Chest pain    from anxiety last time 1 month ago  . 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 Chronic obstructive pulmonary disease (HCC)    asthma  . Cluster headache    relative to sinus problems none recnt  . Constipation   . Depression   . DM2 (diabetes mellitus, type 2) ([place])    well with diet and exercise controlled  . GERD (gastroesophageal reflux disease)    hiatal hernia. Patient did have a Nissen fundoplication rare  . Heart murmur    heard 30 yrs ago  . History of blood transfusion    need for Bld. transfusion relative to taking NSAIDS  . HTN (hypertension)    pt. followed by [facility], last cardiac visit 2012, one yr. ago  . Hyperlipidemia   . Joint pain   . Lactose intolerance   . Left kidney mass   . Low back pain    chronic  . Metastatic non-small cell lung cancer (HCC)    adenocarcinoma, LUL, bone metastasis in 09/2018  . Neuromuscular disorder ([place])    nerve involvement in back & upper back relative to hardware in neck   . Obesity   . OSA and COPD overlap syndrome ([place]) 02/25/2015   no cpap used pt denies sleep apnea  . Osteoarthritis   . Peptic ulcer disease   . Pneumonia not recent per pt  . 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 Sleep concern  states the study (2003 )was done at [facility]., it failed & he was told to return & he never did. Pt. states he has been found by prev. hosp. staff that he has to be told when to breathe & his wife does the same.   Marland Kitchen Spinal fracture    hx of traumatic in Jun 04, 2007 after falling off the roof  . Tinnitus, bilateral   . Vitamin D deficiency     Past Surgical History:  Procedure Laterality Date  . BACK SURGERY     x3- last surgery lumbar- 1998- / fusion   . blepheroplasty     both eyesx2  . C5-T6 posterior fusion     with Oasis and radius screws  . [place], 2008 & 2012 multiple stents  total 4 times  . cataracts     cataracts removed- /w IOL -  both eyes   . CERVICAL SPINE SURGERY    . COLONOSCOPY    . ESOPHAGEAL MANOMETRY    . HARDWARE REMOVAL  02/20/2012   Procedure: HARDWARE REMOVAL;  Surgeon: [name], MD;  Location: [facility];  Service: Neurosurgery;  Laterality: N/A;  Hardware Removal  . HIATAL HERNIA REPAIR  1979, spring 2009  . IR IMAGING GUIDED PORT INSERTION  10/29/2018  . LAPAROSCOPIC CHOLECYSTECTOMY     & IOC  . multiple percutaneous coronary interventions    . NASAL SINUS SURGERY     x2, sees Dr. [name], still having problems, states he uses Benadryl PRN- up to 5 times per day   . right temporal artery biopsy    . right wrist plate insertion  50/3888  . ROBOTIC ASSITED PARTIAL NEPHRECTOMY Left 03/02/2018   Procedure: XI ROBOTIC ASSITED LEFT PARTIAL NEPHRECTOMY WITH LYSIS OF ADHESIONS X30 MINUTES.;  Surgeon: Ardis Hughs, MD;  Location: [facility];  Service: Urology;  Laterality: Left;  CLAMP TIME FOR KIDNEY 1053-1110 TOTAL 18MINUTES  . Rotator cuff surgery Right   . SKIN BIOPSY Right 10/31/2017   Chest  . TRANSURETHRAL RESECTION OF BLADDER TUMOR N/A 11/03/2017   Procedure: cystoscopy;  Surgeon: Kathie Rhodes, MD;  Location: [facility];  Service: Urology;  Laterality: N/A;  . UMBILICAL HERNIA REPAIR    . UPPER GI ENDOSCOPY    . VENTRAL HERNIA REPAIR N/A 03/02/2018   Procedure: LAPAROSCOPIC REPAIR OF INCARCERATED INCISIONAL HERNIA WITH LYSIS OF ADHESIONS;  Surgeon: Michael Boston, MD;  Location: [facility];  Service: General;  Laterality: N/A;    Current Medications: Current Meds  Medication Sig  . acetaminophen (TYLENOL) 500 MG tablet Take 1 tablet (500 mg total) by mouth every 6 (six) hours as needed.  . Alpha-Lipoic Acid 200 MG CAPS Take by mouth 2 (two) times daily.  . Ascorbic Acid (VITAMIN C) 1000 MG tablet Take 1,000 mg by mouth 2 (two) times daily.  . bisoprolol-hydrochlorothiazide (ZIAC) 5-6.25 MG tablet Take 1 tablet by mouth daily.  . Blood Glucose Monitoring Suppl (FREESTYLE FREEDOM LITE) w/Device KIT Check  blood sugar 1 time a day. [id number]  . Cholecalciferol (VITAMIN D3) 5000 units CAPS Take 5,000 Units by mouth 2 (two) times daily.  . CHOLINE PO Take 600 mg by mouth daily.  . clopidogrel (PLAVIX) 75 MG tablet Take 1 tablet (75 mg total) by mouth daily after breakfast.  . diazepam (VALIUM) 5 MG tablet Take 2.5 mg by mouth at bedtime.  Marland Kitchen DOXYLAMINE SUCCINATE PO Take 0.5 tablets by mouth at bedtime.   . folic  acid (FOLVITE) 1 MG tablet Take 1 tablet (1 mg total) by mouth daily.  Marland Kitchen ipratropium (ATROVENT) 0.03 % nasal spray Place 1-2 sprays into both nostrils daily as needed (for allergies.).  Marland Kitchen lidocaine-prilocaine (EMLA) cream Apply 1 application topically as needed.  . magnesium oxide (MAG-OX) 400 MG tablet Take 1,200 mg by mouth as needed.  Marland Kitchen MELATONIN PO Take 30 mg by mouth at bedtime.  . Multiple Vitamins-Minerals (MULTIVITAMIN ADULT PO) Take by mouth daily.  . nitroGLYCERIN (NITROSTAT) 0.4 MG SL tablet Dissolve 1 tablet under tongue every 3 to 5 minutes if needed for Chest Pain  . OVER THE COUNTER MEDICATION Taking OTC Iron 1 tablet daily.  . prochlorperazine (COMPAZINE) 10 MG tablet Take 1 tablet (10 mg total) by mouth every 6 (six) hours as needed for nausea or vomiting.  . SUPER B COMPLEX/C PO Take by mouth daily.  . tamsulosin (FLOMAX) 0.4 MG CAPS capsule Take 1 capsule Daily for for Prostate  . tetrahydrozoline-zinc (VISINE-AC) 0.05-0.25 % ophthalmic solution Place 1-2 drops into both eyes 3 (three) times daily as needed (for redness/irritation.).  Marland Kitchen triamcinolone ointment (KENALOG) 0.1 % Apply 1 application topically 2 (two) times daily.  . Turmeric 500 MG TABS Take by mouth. Takes 3 in the morning and 3 in the evening  . VALERIAN PO Take 400 mg by mouth daily.     Allergies:   Codeine, Meloxicam, Morphine and related, Nsaids, Oxycodone, Crestor [rosuvastatin], Cymbalta [duloxetine hcl], Dilaudid [hydromorphone hcl], Effexor [venlafaxine], Gluten meal, Topamax [topiramate], Tramadol  hcl, Trazodone and nefazodone, Adhesive [tape], Penicillins, and Sulfa antibiotics   Social History   Socioeconomic History  . Marital status: Married    Spouse name: Not on file  . Number of children: Not on file  . Years of education: Not on file  . Highest education level: Not on file  Occupational History  . Occupation: Retired  Scientific laboratory technician  . Financial resource strain: Not on file  . Food insecurity    Worry: Not on file    Inability: Not on file  . Transportation needs    Medical: Not on file    Non-medical: Not on file  Tobacco Use  . Smoking status: Former Smoker    Packs/day: 1.00    Years: 20.00    Pack years: 20.00    Types: Cigarettes    Quit date: 02/14/1978    Years since quitting: 40.8  . Smokeless tobacco: Never Used  Substance and Sexual Activity  . Alcohol use: Not Currently    Alcohol/week: 0.0 standard drinks    Comment: rare  . Drug use: No  . Sexual activity: Not on file  Lifestyle  . Physical activity    Days per week: Not on file    Minutes per session: Not on file  . Stress: Not on file  Relationships  . Social Herbalist on phone: Not on file    Gets together: Not on file    Attends religious service: Not on file    Active member of club or organization: Not on file    Attends meetings of clubs or organizations: Not on file    Relationship status: Not on file  Other Topics Concern  . Not on file  Social History Narrative  . Not on file     Family History:  The patient's family history includes Coronary artery disease in his father; Gout in his sister; Heart attack in his brother, brother, father, and mother; Heart failure  in his mother; Hypertension in his father and mother; Obesity in his mother.   ROS:   Please see the history of present illness.    Review of Systems  Constitution: Positive for malaise/fatigue and weight gain.  HENT: Negative.   Cardiovascular: Negative.   Respiratory: Negative.   Endocrine: Negative.    Hematologic/Lymphatic: Negative.   Musculoskeletal: Negative.   Gastrointestinal: Negative.        Abdominal soreness from recent surgery  Genitourinary: Negative.   Neurological: Positive for weakness.   All other systems reviewed and are negative.  PHYSICAL EXAM:   VS:  BP 120/68   Pulse 71   Ht '5\' 7"'$  (1.702 m)   Wt 204 lb 6.4 oz (92.7 kg)   SpO2 93%   BMI 32.01 kg/m   Physical Exam  GEN: Obese, in no acute distress  Neck: no JVD, carotid bruits, or masses Cardiac:RRR; positive S4 no murmurs, rubs  Respiratory:  clear to auscultation bilaterally, normal work of breathing GI: soft, nontender, nondistended, + BS Ext: without cyanosis, clubbing, or edema, Good distal pulses bilaterally Neuro:  Alert and Oriented x 3, Strength and sensation are intact Psych: euthymic mood, full affect  Wt Readings from Last 3 Encounters:  12/05/18 204 lb 6.4 oz (92.7 kg)  11/29/18 203 lb (92.1 kg)  11/28/18 204 lb 3.2 oz (92.6 kg)      Studies/Labs Reviewed:   EKG:  EKG is not ordered today.   Recent Labs: 11/28/2018: Magnesium 1.7; TSH 1.43 11/29/2018: ALT 32; BUN 22; Creatinine 1.17; Hemoglobin 13.3; Platelet Count 122; Potassium 4.0; Sodium 133   Lipid Panel    Component Value Date/Time   CHOL 219 (H) 11/28/2018 0959   TRIG 287 (H) 11/28/2018 0959   HDL 40 11/28/2018 0959   CHOLHDL 5.5 (H) 11/28/2018 0959   VLDL 31 (H) 01/13/2017 1038   LDLCALC 135 (H) 11/28/2018 0959   LDLDIRECT 116.0 06/03/2013 1513   Additional studies/ records that were reviewed today include:  Cardiac catheterization 12/2010  FINDINGS: 1. Hemodynamics:  LV 118/18, aorta 127/70. 2. Left ventriculography:  EF was estimated to be 55%.  There were no     regional wall motion abnormalities in the RAO projection. 3. Right coronary artery:  The right coronary was a dominant vessel.     There was a proximal-to-mid right coronary artery stent.  There was     about 25% in-stent restenosis.  There was about 30%  distal RCA     stenosis.  The PLV and PDA were both moderate-sized vessels.  There     was about 40-50% ostial PLV stenosis.  The stent in the PDA was     patent.  There were luminal irregularities diffusely in the PDA.     The PLV did provide right-to-left collaterals to the distal     circumflex and also supplied 2 small-to-moderate obtuse marginals. 4. Left main:  The left main had no angiographic coronary disease. 5. Left circumflex system:  The left circumflex was totally occluded     after the first obtuse marginal.  This was known from prior heart     catheterizations.  The first obtuse marginal was a small-to-     moderate vessel with luminal irregularities.  The distal circumflex     was reconstituted via collaterals in the PLV.  There were 2 small-     to-moderate obtuse marginals filled via the collaterals. 6. LAD system:  There was a proximal LAD stent with 30-40% in-stent  restenosis.  The remainder of the LAD had luminal irregularities.     There was a small first diagonal.  There was a moderate-sized     second diagonal that had about 40% ostial stenosis.   IMPRESSION:  The patient has a known occluded circumflex which was seen again today.  There were good collaterals from the right coronary artery system.  The patient's other vessels have no significant disease.  His stents were patent.  We will plan on medical treatment for now.  He will try Prilosec for possible reflux symptoms.     Loralie Champagne, MD DM/MEDQ  D:  01/07/2011  T:  01/07/2011  Job:  [id number]         ASSESSMENT:    1. Chest pain of uncertain etiology      PLAN:  In order of problems listed above:  CAD status post multiple stents in the LAD and RCA.  Last cath 12/2010 with total circumflex with collaterals from RCA, EF 55%.  He now has new chest pains, will refer for Lexiscan nuclear stress test on a D SPECT machine.  Hypertension -well-controlled.  Hyperlipidemia has failed statins.  I  referred to lipid clinic for [place] 09/2017 but he never went.  Most recent lipids 02/16/2018 cholesterol 215 triglycerides 579, LDL not calculated.  He was recommended for Vascepa  but that was cost prohibitive we will ask lipid clinic if there are any other options for him.  Obesity exercise and weight loss program such as weight watchers recommended.  Medication Adjustments/Labs and Tests Ordered: Current medicines are reviewed at length with the patient today.  Concerns regarding medicines are outlined above.  Medication changes, Labs and Tests ordered today are listed in the Patient Instructions below. Patient Instructions  Medication Instructions:  Your physician recommends that you continue on your current medications as directed. Please refer to the Current Medication list given to you today.  If you need a refill on your cardiac medications before your next appointment, please call your pharmacy.   Lab work: None ordered If you have labs (blood work) drawn today and your tests are completely normal, you will receive your results only by: Marland Kitchen MyChart Message (if you have MyChart) OR . A paper copy in the mail If you have any lab test that is abnormal or we need to change your treatment, we will call you to review the results.  Testing/Procedures: Your physician has requested that you have a lexiscan myoview. For further information please visit [email or web address]. Please follow instruction sheet, as given.    Follow-Up: At [facility], you and your health needs are our priority.  As part of our continuing mission to provide you with exceptional heart care, we have created designated Provider Care Teams.  These Care Teams include your primary Cardiologist (physician) and Advanced Practice Providers (APPs -  Physician Assistants and Nurse Practitioners) who all work together to provide you with the care you need, when you need it. You will need a follow up appointment in 6 months.   Please call our office 2 months in advance to schedule this appointment.  You may see Ena Dawley, MD or one of the following Advanced Practice Providers on your designated Care Team:   [place], PA-C Melina Copa, PA-C . Ermalinda Barrios, PA-C  Any Other Special Instructions Will Be Listed Below (If Applicable).       Signed, Ena Dawley, MD  12/05/2018 4:46 PM    [facility] [address], [place], [place]  [address] Phone: [phone]; Fax: [phone]

## 2018-12-05 NOTE — Addendum Note (Signed)
Addended by: [name] on: 12/05/2018 10:30 AM   Modules accepted: Orders

## 2018-12-05 NOTE — Patient Instructions (Addendum)
Medication Instructions:  Your physician recommends that you continue on your current medications as directed. Please refer to the Current Medication list given to you today.  If you need a refill on your cardiac medications before your next appointment, please call your pharmacy.   Lab work: None ordered If you have labs (blood work) drawn today and your tests are completely normal, you will receive your results only by: Marland Kitchen MyChart Message (if you have MyChart) OR . A paper copy in the mail If you have any lab test that is abnormal or we need to change your treatment, we will call you to review the results.  Testing/Procedures: Your physician has requested that you have a lexiscan myoview. For further information please visit [email or web address]. Please follow instruction sheet, as given.    Follow-Up: At [facility], you and your health needs are our priority.  As part of our continuing mission to provide you with exceptional heart care, we have created designated Provider Care Teams.  These Care Teams include your primary Cardiologist (physician) and Advanced Practice Providers (APPs -  Physician Assistants and Nurse Practitioners) who all work together to provide you with the care you need, when you need it. You will need a follow up appointment in 6 months.  Please call our office 2 months in advance to schedule this appointment.  You may see Ena Dawley, MD or one of the following Advanced Practice Providers on your designated Care Team:   [place], PA-C Melina Copa, PA-C . Ermalinda Barrios, PA-C  Any Other Special Instructions Will Be Listed Below (If Applicable).

## 2018-12-06 ENCOUNTER — Other Ambulatory Visit: Payer: Self-pay

## 2018-12-06 ENCOUNTER — Inpatient Hospital Stay: Payer: Medicare Other

## 2018-12-06 ENCOUNTER — Telehealth: Payer: Self-pay | Admitting: Pharmacist

## 2018-12-06 DIAGNOSIS — C3492 Malignant neoplasm of unspecified part of left bronchus or lung: Secondary | ICD-10-CM

## 2018-12-06 DIAGNOSIS — C3412 Malignant neoplasm of upper lobe, left bronchus or lung: Secondary | ICD-10-CM | POA: Diagnosis not present

## 2018-12-06 DIAGNOSIS — Z5112 Encounter for antineoplastic immunotherapy: Secondary | ICD-10-CM | POA: Diagnosis not present

## 2018-12-06 DIAGNOSIS — Z79899 Other long term (current) drug therapy: Secondary | ICD-10-CM | POA: Diagnosis not present

## 2018-12-06 DIAGNOSIS — E782 Mixed hyperlipidemia: Secondary | ICD-10-CM

## 2018-12-06 DIAGNOSIS — Z95828 Presence of other vascular implants and grafts: Secondary | ICD-10-CM

## 2018-12-06 DIAGNOSIS — C7951 Secondary malignant neoplasm of bone: Secondary | ICD-10-CM | POA: Diagnosis not present

## 2018-12-06 DIAGNOSIS — Z5111 Encounter for antineoplastic chemotherapy: Secondary | ICD-10-CM | POA: Diagnosis not present

## 2018-12-06 LAB — CBC WITH DIFFERENTIAL (CANCER CENTER ONLY)
Abs Immature Granulocytes: 0 10*3/uL (ref 0.00–0.07)
Basophils Absolute: 0 10*3/uL (ref 0.0–0.1)
Basophils Relative: 1 %
Eosinophils Absolute: 0.1 10*3/uL (ref 0.0–0.5)
Eosinophils Relative: 2 %
HCT: 35 % — ABNORMAL LOW (ref 39.0–52.0)
Hemoglobin: 12.1 g/dL — ABNORMAL LOW (ref 13.0–17.0)
Immature Granulocytes: 0 %
Lymphocytes Relative: 53 %
Lymphs Abs: 1.1 10*3/uL (ref 0.7–4.0)
MCH: 31.7 pg (ref 26.0–34.0)
MCHC: 34.6 g/dL (ref 30.0–36.0)
MCV: 91.6 fL (ref 80.0–100.0)
Monocytes Absolute: 0.2 10*3/uL (ref 0.1–1.0)
Monocytes Relative: 10 %
Neutro Abs: 0.7 10*3/uL — ABNORMAL LOW (ref 1.7–7.7)
Neutrophils Relative %: 34 %
Platelet Count: 83 10*3/uL — ABNORMAL LOW (ref 150–400)
RBC: 3.82 MIL/uL — ABNORMAL LOW (ref 4.22–5.81)
RDW: 14.7 % (ref 11.5–15.5)
WBC Count: 2.1 10*3/uL — ABNORMAL LOW (ref 4.0–10.5)
nRBC: 0 % (ref 0.0–0.2)

## 2018-12-06 LAB — CMP (CANCER CENTER ONLY)
ALT: 23 U/L (ref 0–44)
AST: 25 U/L (ref 15–41)
Albumin: 3.3 g/dL — ABNORMAL LOW (ref 3.5–5.0)
Alkaline Phosphatase: 85 U/L (ref 38–126)
Anion gap: 8 (ref 5–15)
BUN: 28 mg/dL — ABNORMAL HIGH (ref 8–23)
CO2: 24 mmol/L (ref 22–32)
Calcium: 8.5 mg/dL — ABNORMAL LOW (ref 8.9–10.3)
Chloride: 95 mmol/L — ABNORMAL LOW (ref 98–111)
Creatinine: 1.12 mg/dL (ref 0.61–1.24)
GFR, Est AFR Am: >60 mL/min (ref >60)
GFR, Estimated: >60 mL/min (ref >60)
Glucose, Bld: 97 mg/dL (ref 70–99)
Potassium: 4.4 mmol/L (ref 3.5–5.1)
Sodium: 127 mmol/L — ABNORMAL LOW (ref 135–145)
Total Bilirubin: 0.3 mg/dL (ref 0.3–1.2)
Total Protein: 6.8 g/dL (ref 6.5–8.1)

## 2018-12-06 MED ORDER — SODIUM CHLORIDE 0.9% FLUSH
10.0000 mL | Freq: Once | INTRAVENOUS | Status: AC
  Administered 2018-12-06: 10 mL
  Filled 2018-12-06: qty 10

## 2018-12-06 MED ORDER — HEPARIN SOD (PORK) LOCK FLUSH 100 UNIT/ML IV SOLN
500.0000 [IU] | Freq: Once | INTRAVENOUS | Status: AC
  Administered 2018-12-06: 500 [IU]
  Filled 2018-12-06: qty 5

## 2018-12-06 MED ORDER — VASCEPA 1 G PO CAPS: 2.0000 g | ORAL_CAPSULE | Freq: Two times a day (BID) | ORAL | 11 refills | Status: DC

## 2018-12-06 NOTE — Telephone Encounter (Signed)
Darrell Spark, MD  [place], [name], [name],   The patient has known CAD, intolerant to at least 3 statins, she was approved for Repatha but apparently it was cost prohibitive, is there anything else that she would qualify for that would be cheaper?   Also her husband, [name], [name] has CAD, intolerant to statins, apparently Vascepa was cost prohibitive, again is there anything else that would be more affordable for him?   Thank you,   [place] Siren

## 2018-12-06 NOTE — Telephone Encounter (Signed)
Spoke with pt and discussed that [employer] has been providing copay assistance for patients. I have applied on behalf of pt and he has been approved for $2500 in copay assistance through 11/05/19.  ID [id number] BIN [id number] PCN [id number] GRP [id number]  Rx sent to pharmacy and above info provided, confirmed copay will be $0.  Insurance info:  Member ID: [id number] BIN: [id number] PCN: [place] GRP: [id number]  Scheduled f/u lab work in 3 months.

## 2018-12-10 ENCOUNTER — Telehealth (HOSPITAL_COMMUNITY): Payer: Self-pay

## 2018-12-10 NOTE — Telephone Encounter (Signed)
Spoke with the patient, instructions given, and he stated he would be here for his test. S.Kitt Minardi EMTP

## 2018-12-12 ENCOUNTER — Other Ambulatory Visit: Payer: Self-pay

## 2018-12-12 ENCOUNTER — Ambulatory Visit (HOSPITAL_COMMUNITY): Payer: Medicare Other | Attending: Cardiology

## 2018-12-12 DIAGNOSIS — R0789 Other chest pain: Secondary | ICD-10-CM | POA: Insufficient documentation

## 2018-12-12 DIAGNOSIS — R079 Chest pain, unspecified: Secondary | ICD-10-CM

## 2018-12-12 LAB — MYOCARDIAL PERFUSION IMAGING
LV dias vol: 71 mL (ref 62–150)
LV sys vol: 26 mL
Peak HR: 71 {beats}/min
Rest HR: 60 {beats}/min
SDS: 3
SRS: 0
SSS: 3
TID: 0.97

## 2018-12-12 MED ORDER — REGADENOSON 0.4 MG/5ML IV SOLN
0.4000 mg | Freq: Once | INTRAVENOUS | Status: AC
  Administered 2018-12-12: 0.4 mg via INTRAVENOUS

## 2018-12-12 MED ORDER — TECHNETIUM TC 99M TETROFOSMIN IV KIT
10.5000 | PACK | Freq: Once | INTRAVENOUS | Status: AC | PRN
  Administered 2018-12-12: 10.5 via INTRAVENOUS
  Filled 2018-12-12: qty 11

## 2018-12-12 MED ORDER — TECHNETIUM TC 99M TETROFOSMIN IV KIT
31.8000 | PACK | Freq: Once | INTRAVENOUS | Status: AC | PRN
  Administered 2018-12-12: 31.8 via INTRAVENOUS
  Filled 2018-12-12: qty 32

## 2018-12-13 ENCOUNTER — Other Ambulatory Visit: Payer: Self-pay

## 2018-12-13 ENCOUNTER — Inpatient Hospital Stay: Payer: Medicare Other | Attending: Internal Medicine

## 2018-12-13 ENCOUNTER — Inpatient Hospital Stay: Payer: Medicare Other

## 2018-12-13 DIAGNOSIS — Z5111 Encounter for antineoplastic chemotherapy: Secondary | ICD-10-CM | POA: Diagnosis not present

## 2018-12-13 DIAGNOSIS — C3492 Malignant neoplasm of unspecified part of left bronchus or lung: Secondary | ICD-10-CM

## 2018-12-13 DIAGNOSIS — C7951 Secondary malignant neoplasm of bone: Secondary | ICD-10-CM | POA: Diagnosis not present

## 2018-12-13 DIAGNOSIS — Z95828 Presence of other vascular implants and grafts: Secondary | ICD-10-CM

## 2018-12-13 DIAGNOSIS — R5382 Chronic fatigue, unspecified: Secondary | ICD-10-CM

## 2018-12-13 DIAGNOSIS — C3412 Malignant neoplasm of upper lobe, left bronchus or lung: Secondary | ICD-10-CM | POA: Insufficient documentation

## 2018-12-13 DIAGNOSIS — Z79899 Other long term (current) drug therapy: Secondary | ICD-10-CM | POA: Insufficient documentation

## 2018-12-13 DIAGNOSIS — Z5112 Encounter for antineoplastic immunotherapy: Secondary | ICD-10-CM | POA: Insufficient documentation

## 2018-12-13 LAB — CBC WITH DIFFERENTIAL (CANCER CENTER ONLY)
Abs Immature Granulocytes: 0.01 10*3/uL (ref 0.00–0.07)
Basophils Absolute: 0 10*3/uL (ref 0.0–0.1)
Basophils Relative: 0 %
Eosinophils Absolute: 0.1 10*3/uL (ref 0.0–0.5)
Eosinophils Relative: 2 %
HCT: 32.8 % — ABNORMAL LOW (ref 39.0–52.0)
Hemoglobin: 11.5 g/dL — ABNORMAL LOW (ref 13.0–17.0)
Immature Granulocytes: 0 %
Lymphocytes Relative: 39 %
Lymphs Abs: 1.2 10*3/uL (ref 0.7–4.0)
MCH: 32.6 pg (ref 26.0–34.0)
MCHC: 35.1 g/dL (ref 30.0–36.0)
MCV: 92.9 fL (ref 80.0–100.0)
Monocytes Absolute: 0.4 10*3/uL (ref 0.1–1.0)
Monocytes Relative: 14 %
Neutro Abs: 1.4 10*3/uL — ABNORMAL LOW (ref 1.7–7.7)
Neutrophils Relative %: 45 %
Platelet Count: 100 10*3/uL — ABNORMAL LOW (ref 150–400)
RBC: 3.53 MIL/uL — ABNORMAL LOW (ref 4.22–5.81)
RDW: 15.9 % — ABNORMAL HIGH (ref 11.5–15.5)
WBC Count: 3 10*3/uL — ABNORMAL LOW (ref 4.0–10.5)
nRBC: 0 % (ref 0.0–0.2)

## 2018-12-13 LAB — CMP (CANCER CENTER ONLY)
ALT: 28 U/L (ref 0–44)
AST: 30 U/L (ref 15–41)
Albumin: 3.3 g/dL — ABNORMAL LOW (ref 3.5–5.0)
Alkaline Phosphatase: 86 U/L (ref 38–126)
Anion gap: 12 (ref 5–15)
BUN: 22 mg/dL (ref 8–23)
CO2: 21 mmol/L — ABNORMAL LOW (ref 22–32)
Calcium: 8.6 mg/dL — ABNORMAL LOW (ref 8.9–10.3)
Chloride: 96 mmol/L — ABNORMAL LOW (ref 98–111)
Creatinine: 1.22 mg/dL (ref 0.61–1.24)
GFR, Est AFR Am: >60 mL/min (ref >60)
GFR, Estimated: 58 mL/min — ABNORMAL LOW (ref >60)
Glucose, Bld: 120 mg/dL — ABNORMAL HIGH (ref 70–99)
Potassium: 4.2 mmol/L (ref 3.5–5.1)
Sodium: 129 mmol/L — ABNORMAL LOW (ref 135–145)
Total Bilirubin: 0.2 mg/dL — ABNORMAL LOW (ref 0.3–1.2)
Total Protein: 7.1 g/dL (ref 6.5–8.1)

## 2018-12-13 MED ORDER — HEPARIN SOD (PORK) LOCK FLUSH 100 UNIT/ML IV SOLN
500.0000 [IU] | Freq: Once | INTRAVENOUS | Status: AC
  Administered 2018-12-13: 500 [IU]
  Filled 2018-12-13: qty 5

## 2018-12-13 MED ORDER — SODIUM CHLORIDE 0.9% FLUSH
10.0000 mL | Freq: Once | INTRAVENOUS | Status: AC
  Administered 2018-12-13: 10 mL
  Filled 2018-12-13: qty 10

## 2018-12-13 NOTE — Progress Notes (Signed)
Subjective:    Patient ID: [name], male    DOB: 10/12/1945, 73 y.o.   MRN: [id number]  HPI 73 y.o. WM with history of Coronary atherosclerosis; COPD mixed type ([place]);  Rhinitis, nonallergic, chronic; Left renal mass s/p robotic partial nephrectomy 03/02/2018 secondary to Renal cell carcinoma, left ([place]); Adenocarcinoma of left lung, stage 4 ([facility]) currently in treatment; presents with sinus issues X 1 WEEK. Last treated with levaquin 10/2018.   States he has had sinus issues, yellow/green mucus with cough, sinus drainage. He has history of recurrent sinus infections, has sinus tenderness, no fever, chills, he has had some fatigue with anemia, no SOB/CP,  is currently getting chemotherapy and is immunosuppressed.  He has been social distancing, no SOB, fever, chills, loss of smell/taste.  No orthopnea, no PND, no edema.   BMI is Body mass index is 32.17 kg/m. No change in weight.  Wt Readings from Last 3 Encounters:  12/17/18 205 lb 6.4 oz (93.2 kg)  12/12/18 204 lb (92.5 kg)  12/05/18 204 lb 6.4 oz (92.7 kg)     Lab Results  Component Value Date   GFRNONAA 58 (L) 12/13/2018    Blood pressure 124/86, pulse 71, temperature 97.9 F (36.6 C), height _0  (1.702 m), weight 205 lb 6.4 oz (93.2 kg), SpO2 98 %.  Medications Current Outpatient Medications on File Prior to Visit  Medication Sig  . acetaminophen (TYLENOL) 500 MG tablet Take 1 tablet (500 mg total) by mouth every 6 (six) hours as needed.  . Alpha-Lipoic Acid 200 MG CAPS Take by mouth 2 (two) times daily.  . Ascorbic Acid (VITAMIN C) 1000 MG tablet Take 1,000 mg by mouth 2 (two) times daily.  . bisoprolol-hydrochlorothiazide (ZIAC) 5-6.25 MG tablet Take 1 tablet by mouth daily.  . Blood Glucose Monitoring Suppl (FREESTYLE FREEDOM LITE) w/Device KIT Check blood sugar 1 time a day. [id number]  . Cholecalciferol (VITAMIN D3) 5000 units CAPS Take 5,000 Units by mouth 2 (two) times daily.  . CHOLINE PO Take 600 mg by  mouth daily.  . clopidogrel (PLAVIX) 75 MG tablet Take 1 tablet (75 mg total) by mouth daily after breakfast.  . diazepam (VALIUM) 5 MG tablet Take 2.5 mg by mouth at bedtime.  Marland Kitchen DOXYLAMINE SUCCINATE PO Take 0.5 tablets by mouth at bedtime.   . folic acid (FOLVITE) 1 MG tablet Take 1 tablet (1 mg total) by mouth daily.  Vanessa Kick Ethyl (VASCEPA) 1 g CAPS Take 2 capsules (2 g total) by mouth 2 (two) times a day.  . ipratropium (ATROVENT) 0.03 % nasal spray Place 1-2 sprays into both nostrils daily as needed (for allergies.).  Marland Kitchen lidocaine-prilocaine (EMLA) cream Apply 1 application topically as needed.  . magnesium oxide (MAG-OX) 400 MG tablet Take 1,200 mg by mouth as needed.  Marland Kitchen MELATONIN PO Take 30 mg by mouth at bedtime.  . Multiple Vitamins-Minerals (MULTIVITAMIN ADULT PO) Take by mouth daily.  . nitroGLYCERIN (NITROSTAT) 0.4 MG SL tablet Dissolve 1 tablet under tongue every 3 to 5 minutes if needed for Chest Pain  . OVER THE COUNTER MEDICATION Taking OTC Iron 1 tablet daily.  . prochlorperazine (COMPAZINE) 10 MG tablet Take 1 tablet (10 mg total) by mouth every 6 (six) hours as needed for nausea or vomiting.  . SUPER B COMPLEX/C PO Take by mouth daily.  . tamsulosin (FLOMAX) 0.4 MG CAPS capsule Take 1 capsule Daily for for Prostate  . tetrahydrozoline-zinc (VISINE-AC) 0.05-0.25 % ophthalmic solution Place 1-2 drops  into both eyes 3 (three) times daily as needed (for redness/irritation.).  Marland Kitchen triamcinolone ointment (KENALOG) 0.1 % Apply 1 application topically 2 (two) times daily.  . Turmeric 500 MG TABS Take by mouth. Takes 3 in the morning and 3 in the evening  . VALERIAN PO Take 400 mg by mouth daily.   No current facility-administered medications on file prior to visit.     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   Review of Systems  Constitutional: Negative for chills, diaphoresis and fever.  HENT: Positive for congestion, sinus pressure, sneezing and sore throat. Negative for ear pain, trouble swallowing and voice change.   Eyes: Negative.   Respiratory: Positive for cough. Negative for chest tightness, shortness of breath and wheezing.   Cardiovascular: Negative.   Gastrointestinal: Negative.   Genitourinary: Negative.   Musculoskeletal: Negative for neck pain.  Neurological: Negative.  Negative for headaches.       Objective:   Physical Exam Constitutional:      Appearance: He is well-developed.  HENT:     Head: Normocephalic and atraumatic.     Right Ear: Hearing and tympanic membrane normal.     Left Ear: Hearing and tympanic membrane normal.     Nose:     Right Sinus: Maxillary sinus tenderness present.     Left Sinus: Maxillary sinus tenderness present.     Mouth/Throat:     Pharynx: Uvula midline. Posterior oropharyngeal erythema present. No oropharyngeal exudate.     Tonsils: No tonsillar abscesses.  Eyes:     Conjunctiva/sclera: Conjunctivae normal.     Pupils: Pupils are equal, round, and reactive to light.  Neck:     Musculoskeletal: Normal range of motion and neck supple.  Cardiovascular:     Rate and Rhythm: Normal rate and  regular rhythm.  Pulmonary:     Effort: Pulmonary effort is normal.     Breath sounds: Normal breath sounds.  Abdominal:     General: Bowel sounds are normal.     Palpations: Abdomen is soft.  Musculoskeletal: Normal range of motion.  Lymphadenopathy:     Cervical: No cervical adenopathy.  Skin:    General: Skin is warm and dry.     Findings: No rash.  Neurological:     Mental Status: He is alert and oriented to person, place, and time.        Assessment & Plan:    Recurrent sinusitis Will hold the levaquin and take if he is not getting better, increase fluids, rest, cont allergy pill -     levofloxacin (LEVAQUIN) 500 MG tablet; Take 1 tablet (500 mg total) by mouth daily. -     triamcinolone (NASACORT) 55 MCG/ACT AERO nasal inhaler; Place 2 sprays into the nose at bedtime.  Adenocarcinoma of left lung, stage 4 ([place]) Continue follow up oncology  COPD mixed type ([place]) well controlled symptoms, cont to  monitor  Renal cell carcinoma, left ([facility]) Monitor  Chronic kidney disease (CKD), stage III (moderate) ([facility]) Increase fluids, avoid NSAIDS, monitor sugars, will monitor

## 2018-12-17 ENCOUNTER — Other Ambulatory Visit: Payer: Self-pay

## 2018-12-17 ENCOUNTER — Encounter: Payer: Self-pay | Admitting: Physician Assistant

## 2018-12-17 ENCOUNTER — Ambulatory Visit (INDEPENDENT_AMBULATORY_CARE_PROVIDER_SITE_OTHER): Payer: Medicare Other | Admitting: Physician Assistant

## 2018-12-17 VITALS — BP 124/86 | HR 71 | Temp 97.9°F | Ht 67.0 in | Wt 205.4 lb

## 2018-12-17 DIAGNOSIS — I25810 Atherosclerosis of coronary artery bypass graft(s) without angina pectoris: Secondary | ICD-10-CM | POA: Diagnosis not present

## 2018-12-17 DIAGNOSIS — G4733 Obstructive sleep apnea (adult) (pediatric): Secondary | ICD-10-CM

## 2018-12-17 DIAGNOSIS — N183 Chronic kidney disease, stage 3 unspecified: Secondary | ICD-10-CM

## 2018-12-17 DIAGNOSIS — J329 Chronic sinusitis, unspecified: Secondary | ICD-10-CM

## 2018-12-17 DIAGNOSIS — C3492 Malignant neoplasm of unspecified part of left bronchus or lung: Secondary | ICD-10-CM

## 2018-12-17 DIAGNOSIS — C642 Malignant neoplasm of left kidney, except renal pelvis: Secondary | ICD-10-CM | POA: Diagnosis not present

## 2018-12-17 DIAGNOSIS — J449 Chronic obstructive pulmonary disease, unspecified: Secondary | ICD-10-CM | POA: Diagnosis not present

## 2018-12-17 LAB — TSH: TSH: 1.509 u[IU]/mL (ref 0.320–4.118)

## 2018-12-17 MED ORDER — LEVOFLOXACIN 500 MG PO TABS: 500.0000 mg | ORAL_TABLET | Freq: Every day | ORAL | 0 refills | Status: DC

## 2018-12-17 MED ORDER — TRIAMCINOLONE ACETONIDE 55 MCG/ACT NA AERO: 2.0000 | INHALATION_SPRAY | Freq: Every day | NASAL | 3 refills | Status: AC

## 2018-12-17 NOTE — Patient Instructions (Signed)
Can do a steroid nasal spary 1-2 sparys at night each nostril.  Remember to spray each nostril twice towards the outer part of your eye.   Do not sniff but instead pinch your nose and tilt your head back to help the medicine get into your sinuses.   The best time to do this is at bedtime.  Stop if you get blurred vision or nose bleeds.   THIS WILL TAKE 7 DAYS TO WORK AND IS BETTER IF YOU START BEFORE SYMPTOMS SO IF YOU HAVE A SEASON OR TIME OF THE YEAR YOU ALWAYS GET A COLD, START BEFORE THAT!   HOW TO TREAT VIRAL COUGH AND COLD SYMPTOMS:  -Symptoms usually last at least 1 week with the worst symptoms being around day 4.  - colds usually start with a sore throat and end with a cough, and the cough can take 2 weeks to get better.  -No antibiotics are needed for colds, flu, sore throats, cough, bronchitis UNLESS symptoms are longer than 7 days OR if you are getting better then get drastically worse.  -There are a lot of combination medications (Dayquil, Nyquil, Vicks 44, tyelnol cold and sinus, ETC). Please look at the ingredients on the back so that you are treating the correct symptoms and not doubling up on medications/ingredients.    Medicines you can use  Nasal congestion  Little Remedies saline spray (aerosol/mist)- can try this, it is in the kids section - pseudoephedrine (Sudafed)- behind the counter, do not use if you have high blood pressure, medicine that have -D in them.  - phenylephrine (Sudafed PE) -Dextormethorphan + chlorpheniramine (Coridcidin HBP)- okay if you have high blood pressure -Oxymetazoline (Afrin) nasal spray- LIMIT to 3 days -Saline nasal spray -Neti pot (used distilled or bottled water)  Ear pain/congestion  -pseudoephedrine (sudafed) - Nasonex/flonase nasal spray  Fever  -Acetaminophen (Tyelnol) -Ibuprofen (Advil, motrin, aleve)  Sore Throat  -Acetaminophen (Tyelnol) -Ibuprofen (Advil, motrin, aleve) -Drink a lot of water -Gargle with salt water -  Rest your voice (don't talk) -Throat sprays -Cough drops  Body Aches  -Acetaminophen (Tyelnol) -Ibuprofen (Advil, motrin, aleve)  Headache  -Acetaminophen (Tyelnol) -Ibuprofen (Advil, motrin, aleve) - Exedrin, Exedrin Migraine  Allergy symptoms (cough, sneeze, runny nose, itchy eyes) -Claritin or loratadine cheapest but likely the weakest  -Zyrtec or certizine at night because it can make you sleepy -The strongest is allegra or fexafinadine  Cheapest at [employer], [employer], [employer]  Cough  -Dextromethorphan (Delsym)- medicine that has DM in it -Guafenesin (Mucinex/Robitussin) - cough drops - drink lots of water  Chest Congestion  -Guafenesin (Mucinex/Robitussin)  Red Itchy Eyes  - Naphcon-A  Upset Stomach  - Bland diet (nothing spicy, greasy, fried, and high acid foods like tomatoes, oranges, berries) -OKAY- cereal, bread, soup, crackers, rice -Eat smaller more frequent meals -reduce caffeine, no alcohol -Loperamide (Imodium-AD) if diarrhea -Prevacid for heart burn  General health when sick  -Hydration -wash your hands frequently -keep surfaces clean -change pillow cases and sheets often -Get fresh air but do not exercise strenuously -Vitamin D, double up on it - Vitamin C -Zinc

## 2018-12-18 ENCOUNTER — Other Ambulatory Visit: Payer: Self-pay

## 2018-12-18 ENCOUNTER — Ambulatory Visit (HOSPITAL_COMMUNITY)
Admission: RE | Admit: 2018-12-18 | Discharge: 2018-12-18 | Disposition: A | Discharge status: Home / Self Care | Payer: Medicare Other | Source: Ambulatory Visit | Attending: Physician Assistant | Admitting: Physician Assistant

## 2018-12-18 ENCOUNTER — Inpatient Hospital Stay: Payer: Medicare Other

## 2018-12-18 ENCOUNTER — Encounter (HOSPITAL_COMMUNITY): Payer: Self-pay

## 2018-12-18 DIAGNOSIS — C3492 Malignant neoplasm of unspecified part of left bronchus or lung: Secondary | ICD-10-CM | POA: Insufficient documentation

## 2018-12-18 DIAGNOSIS — Z79899 Other long term (current) drug therapy: Secondary | ICD-10-CM | POA: Diagnosis not present

## 2018-12-18 DIAGNOSIS — Z85118 Personal history of other malignant neoplasm of bronchus and lung: Secondary | ICD-10-CM | POA: Diagnosis not present

## 2018-12-18 DIAGNOSIS — C7951 Secondary malignant neoplasm of bone: Secondary | ICD-10-CM | POA: Diagnosis not present

## 2018-12-18 DIAGNOSIS — K5790 Diverticulosis of intestine, part unspecified, without perforation or abscess without bleeding: Secondary | ICD-10-CM | POA: Diagnosis not present

## 2018-12-18 DIAGNOSIS — Z95828 Presence of other vascular implants and grafts: Secondary | ICD-10-CM

## 2018-12-18 DIAGNOSIS — R918 Other nonspecific abnormal finding of lung field: Secondary | ICD-10-CM | POA: Diagnosis not present

## 2018-12-18 DIAGNOSIS — C3412 Malignant neoplasm of upper lobe, left bronchus or lung: Secondary | ICD-10-CM | POA: Diagnosis not present

## 2018-12-18 DIAGNOSIS — Z5111 Encounter for antineoplastic chemotherapy: Secondary | ICD-10-CM | POA: Diagnosis not present

## 2018-12-18 DIAGNOSIS — Z5112 Encounter for antineoplastic immunotherapy: Secondary | ICD-10-CM | POA: Diagnosis not present

## 2018-12-18 DIAGNOSIS — N200 Calculus of kidney: Secondary | ICD-10-CM | POA: Diagnosis not present

## 2018-12-18 LAB — CMP (CANCER CENTER ONLY)
ALT: 28 U/L (ref 0–44)
AST: 29 U/L (ref 15–41)
Albumin: 3.4 g/dL — ABNORMAL LOW (ref 3.5–5.0)
Alkaline Phosphatase: 75 U/L (ref 38–126)
Anion gap: 11 (ref 5–15)
BUN: 20 mg/dL (ref 8–23)
CO2: 25 mmol/L (ref 22–32)
Calcium: 9 mg/dL (ref 8.9–10.3)
Chloride: 96 mmol/L — ABNORMAL LOW (ref 98–111)
Creatinine: 1.1 mg/dL (ref 0.61–1.24)
GFR, Est AFR Am: >60 mL/min (ref >60)
GFR, Estimated: >60 mL/min (ref >60)
Glucose, Bld: 111 mg/dL — ABNORMAL HIGH (ref 70–99)
Potassium: 4.2 mmol/L (ref 3.5–5.1)
Sodium: 132 mmol/L — ABNORMAL LOW (ref 135–145)
Total Bilirubin: 0.4 mg/dL (ref 0.3–1.2)
Total Protein: 7.2 g/dL (ref 6.5–8.1)

## 2018-12-18 LAB — CBC WITH DIFFERENTIAL (CANCER CENTER ONLY)
Abs Immature Granulocytes: 0.03 10*3/uL (ref 0.00–0.07)
Basophils Absolute: 0 10*3/uL (ref 0.0–0.1)
Basophils Relative: 1 %
Eosinophils Absolute: 0 10*3/uL (ref 0.0–0.5)
Eosinophils Relative: 1 %
HCT: 34.9 % — ABNORMAL LOW (ref 39.0–52.0)
Hemoglobin: 12.1 g/dL — ABNORMAL LOW (ref 13.0–17.0)
Immature Granulocytes: 1 %
Lymphocytes Relative: 43 %
Lymphs Abs: 1.2 10*3/uL (ref 0.7–4.0)
MCH: 32.2 pg (ref 26.0–34.0)
MCHC: 34.7 g/dL (ref 30.0–36.0)
MCV: 92.8 fL (ref 80.0–100.0)
Monocytes Absolute: 0.5 10*3/uL (ref 0.1–1.0)
Monocytes Relative: 17 %
Neutro Abs: 1 10*3/uL — ABNORMAL LOW (ref 1.7–7.7)
Neutrophils Relative %: 37 %
Platelet Count: 128 10*3/uL — ABNORMAL LOW (ref 150–400)
RBC: 3.76 MIL/uL — ABNORMAL LOW (ref 4.22–5.81)
RDW: 16.4 % — ABNORMAL HIGH (ref 11.5–15.5)
WBC Count: 2.8 10*3/uL — ABNORMAL LOW (ref 4.0–10.5)
nRBC: 0 % (ref 0.0–0.2)

## 2018-12-18 MED ORDER — SODIUM CHLORIDE 0.9% FLUSH
10.0000 mL | Freq: Once | INTRAVENOUS | Status: AC
  Administered 2018-12-18: 10 mL
  Filled 2018-12-18: qty 10

## 2018-12-18 MED ORDER — SODIUM CHLORIDE (PF) 0.9 % IJ SOLN
INTRAMUSCULAR | Status: AC
  Filled 2018-12-18: qty 50

## 2018-12-18 MED ORDER — HEPARIN SOD (PORK) LOCK FLUSH 100 UNIT/ML IV SOLN
INTRAVENOUS | Status: AC
  Filled 2018-12-18: qty 5

## 2018-12-18 MED ORDER — HEPARIN SOD (PORK) LOCK FLUSH 100 UNIT/ML IV SOLN
500.0000 [IU] | Freq: Once | INTRAVENOUS | Status: AC
  Administered 2018-12-18: 500 [IU] via INTRAVENOUS

## 2018-12-18 MED ORDER — IOHEXOL 300 MG/ML  SOLN
100.0000 mL | Freq: Once | INTRAMUSCULAR | Status: AC | PRN
  Administered 2018-12-18: 100 mL via INTRAVENOUS

## 2018-12-19 ENCOUNTER — Telehealth: Payer: Self-pay | Admitting: Physician Assistant

## 2018-12-19 ENCOUNTER — Telehealth: Payer: Self-pay | Admitting: Medical Oncology

## 2018-12-19 ENCOUNTER — Other Ambulatory Visit: Payer: Self-pay | Admitting: Physician Assistant

## 2018-12-19 DIAGNOSIS — I2699 Other pulmonary embolism without acute cor pulmonale: Secondary | ICD-10-CM

## 2018-12-19 MED ORDER — RIVAROXABAN (XARELTO) VTE STARTER PACK (15 & 20 MG): ORAL_TABLET | ORAL | 0 refills | Status: DC

## 2018-12-19 MED FILL — XARELTO STARTER PACK: 15 & 20 | 30 days supply | Qty: 51 | Fill #0

## 2018-12-19 NOTE — Telephone Encounter (Signed)
Pt took plavix today and forgot to tell Cassie , NP. When does he start xarelto?

## 2018-12-19 NOTE — Telephone Encounter (Signed)
Spoke to the patient about his recent CT scan. The scan noted a pulmonary embolism. I instructed the patient to pick up the Xarelto starter pack and start taking it today. The patient also takes plavix due to having cardiac stents ~20 years ago, per patient report. I instructed the patient to hold the plavix until I reach out to the patient's cardiologist. He expressed understanding.

## 2018-12-19 NOTE — Telephone Encounter (Signed)
Pt instructed to start xarelto tomorrow.

## 2018-12-20 ENCOUNTER — Inpatient Hospital Stay: Payer: Medicare Other

## 2018-12-20 ENCOUNTER — Inpatient Hospital Stay (HOSPITAL_BASED_OUTPATIENT_CLINIC_OR_DEPARTMENT_OTHER): Payer: Medicare Other | Admitting: Internal Medicine

## 2018-12-20 ENCOUNTER — Other Ambulatory Visit: Payer: Medicare Other

## 2018-12-20 ENCOUNTER — Other Ambulatory Visit: Payer: Self-pay

## 2018-12-20 ENCOUNTER — Encounter: Payer: Self-pay | Admitting: Internal Medicine

## 2018-12-20 ENCOUNTER — Telehealth: Payer: Self-pay | Admitting: Internal Medicine

## 2018-12-20 VITALS — BP 127/71 | HR 72 | Temp 98.7°F | Resp 18 | Ht 67.0 in | Wt 204.3 lb

## 2018-12-20 DIAGNOSIS — R0609 Other forms of dyspnea: Secondary | ICD-10-CM

## 2018-12-20 DIAGNOSIS — C7951 Secondary malignant neoplasm of bone: Secondary | ICD-10-CM

## 2018-12-20 DIAGNOSIS — Z7901 Long term (current) use of anticoagulants: Secondary | ICD-10-CM

## 2018-12-20 DIAGNOSIS — Z5112 Encounter for antineoplastic immunotherapy: Secondary | ICD-10-CM

## 2018-12-20 DIAGNOSIS — Z5111 Encounter for antineoplastic chemotherapy: Secondary | ICD-10-CM | POA: Diagnosis not present

## 2018-12-20 DIAGNOSIS — C3412 Malignant neoplasm of upper lobe, left bronchus or lung: Secondary | ICD-10-CM

## 2018-12-20 DIAGNOSIS — I2699 Other pulmonary embolism without acute cor pulmonale: Secondary | ICD-10-CM | POA: Diagnosis not present

## 2018-12-20 DIAGNOSIS — Z79899 Other long term (current) drug therapy: Secondary | ICD-10-CM | POA: Diagnosis not present

## 2018-12-20 DIAGNOSIS — C3492 Malignant neoplasm of unspecified part of left bronchus or lung: Secondary | ICD-10-CM

## 2018-12-20 DIAGNOSIS — C642 Malignant neoplasm of left kidney, except renal pelvis: Secondary | ICD-10-CM

## 2018-12-20 DIAGNOSIS — Z86711 Personal history of pulmonary embolism: Secondary | ICD-10-CM | POA: Insufficient documentation

## 2018-12-20 DIAGNOSIS — R5383 Other fatigue: Secondary | ICD-10-CM | POA: Diagnosis not present

## 2018-12-20 DIAGNOSIS — Z85528 Personal history of other malignant neoplasm of kidney: Secondary | ICD-10-CM | POA: Diagnosis not present

## 2018-12-20 MED ORDER — SODIUM CHLORIDE 0.9 % IV SOLN
485.0000 mg/m2 | Freq: Once | INTRAVENOUS | Status: AC
  Administered 2018-12-20: 1000 mg via INTRAVENOUS
  Filled 2018-12-20: qty 40

## 2018-12-20 MED ORDER — SODIUM CHLORIDE 0.9 % IV SOLN
Freq: Once | INTRAVENOUS | Status: AC
  Administered 2018-12-20: 13:00:00 via INTRAVENOUS
  Filled 2018-12-20: qty 5

## 2018-12-20 MED ORDER — SODIUM CHLORIDE 0.9 % IV SOLN
500.0000 mg | Freq: Once | INTRAVENOUS | Status: AC
  Administered 2018-12-20: 500 mg via INTRAVENOUS
  Filled 2018-12-20: qty 50

## 2018-12-20 MED ORDER — PALONOSETRON HCL INJECTION 0.25 MG/5ML
0.2500 mg | Freq: Once | INTRAVENOUS | Status: AC
  Administered 2018-12-20: 0.25 mg via INTRAVENOUS

## 2018-12-20 MED ORDER — SODIUM CHLORIDE 0.9 % IV SOLN
Freq: Once | INTRAVENOUS | Status: AC
  Administered 2018-12-20: 13:00:00 via INTRAVENOUS
  Filled 2018-12-20: qty 250

## 2018-12-20 MED ORDER — PALONOSETRON HCL INJECTION 0.25 MG/5ML
INTRAVENOUS | Status: AC
  Filled 2018-12-20: qty 5

## 2018-12-20 MED ORDER — SODIUM CHLORIDE 0.9% FLUSH
10.0000 mL | INTRAVENOUS | Status: DC | PRN
  Administered 2018-12-20: 10 mL
  Filled 2018-12-20: qty 10

## 2018-12-20 MED ORDER — SODIUM CHLORIDE 0.9 % IV SOLN
200.0000 mg | Freq: Once | INTRAVENOUS | Status: AC
  Administered 2018-12-20: 200 mg via INTRAVENOUS
  Filled 2018-12-20: qty 8

## 2018-12-20 MED ORDER — HEPARIN SOD (PORK) LOCK FLUSH 100 UNIT/ML IV SOLN
500.0000 [IU] | Freq: Once | INTRAVENOUS | Status: AC | PRN
  Administered 2018-12-20: 500 [IU]
  Filled 2018-12-20: qty 5

## 2018-12-20 NOTE — Telephone Encounter (Signed)
Scheduled appt per 7/09 los - gave patient AVS and calender per los.

## 2018-12-20 NOTE — Progress Notes (Signed)
Per Dr. Julien Nordmann, ok to treat with ANC 1.0.

## 2018-12-20 NOTE — Progress Notes (Signed)
[place] [phone]   [phone]  OFFICE PROGRESS NOTE  [name], MD [address] [place] [address]  DIAGNOSIS:  1) Stage IV (T1c, N0, M1 C) non-small cell lung cancer, adenocarcinoma presented with left upper lobe lung nodule in addition to metastatic disease to the bone diagnosed in April 2020. 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   3) incidental finding of pulmonary embolism involving the right middle lobe pulmonary artery on CT scan on 12/18/2018.  Molecular studies by [facility] 287  STK11Splice Site SNV 6.8% Everolimus,Temsirolimus  RIT1S18f 0.8% None (VUS), No (VUS)  NTRK3 L629L 1.5%  (Synonymous) No (Synonymous)  KRAS G12D 1.1% Binimetinib  CDKN2A H83Y 1.6% Abemaciclib, Palbociclib, Ribociclib  PDL1 TPS was 0%  PRIOR THERAPY: None  CURRENT THERAPY:  1) Systemic chemotherapy with carboplatin for AUC of 5, Alimta 500 mg/M2 and Keytruda 200 mg IV every 3 weeks.  First dose Oct 16, 2018.  Status post 3 cycles. 2) Xarelto initially 15 mg p.o. twice daily for 3 weeks followed by 20 mg p.o. daily.  He started the first dose of this treatment on 12/20/2018.  INTERVAL HISTORY: Darrell HanssenSouthern 73y.o. male returns to the clinic today for follow-up visit.  His wife, FManus Gunningwas available by phone during the visit.  The patient is doing fine today with no concerning complaints.  He denied having any chest pain but has shortness of breath with exertion with no cough or hemoptysis.  He denied having any fever or chills.  He has no nausea, vomiting, diarrhea or constipation.  He denied having any weight loss or night sweats.  He has no headache or visual changes.  He has been tolerating his treatment with chemotherapy fairly well except for fatigue.  He had repeat CT scan of the chest, abdomen pelvis performed recently and he is here  for evaluation and discussion of his scan results.  MEDICAL HISTORY: Past Medical History:  Diagnosis Date   Anginal pain ([place]    Anxiety    treated /w Xanax for mild depression , using 2 times per day, not PRN   Asthma    Benign prostatic hypertrophy    CAD (coronary artery disease)    pt last left heart cath was in Nov 2008. EF was 55% on left ventriculogram. Circumflex was totally occluded after the 1st obtuse marginal with collaterals supplying the distal circumflex. LAD showed luminal irregularities. The stent in LAD was patent. The RCA showed luminal irregularities. The stent in th emid RCA and PDA were patent. There were no inverventions.    Chest pain    from anxiety last time 1 month ago   Chromophobe renal cell carcinoma ([place] 02/[place]   s/p left kidney wedge resection   Chronic obstructive pulmonary disease (HCC)    asthma   Cluster headache    relative to sinus problems none recnt   Constipation    Depression    DM2 (diabetes mellitus, type 2) ([place]    well with diet and exercise controlled   GERD (gastroesophageal reflux disease)    hiatal hernia. Patient did have a Nissen fundoplication rare   Heart murmur    heard 30 yrs ago   History of blood transfusion    need for Bld. transfusion relative to taking NSAIDS   HTN (hypertension)    pt. followed by [facility], last  cardiac visit 2012, one yr. ago   Hyperlipidemia    Joint pain    Lactose intolerance    Left kidney mass    Low back pain    chronic   Metastatic non-small cell lung cancer ([place])    adenocarcinoma, LUL, bone metastasis in 09/2018   Neuromuscular disorder ([place])    nerve involvement in back & upper back relative to hardware in neck    Obesity    OSA and COPD overlap syndrome ([place]) 02/25/2015   no cpap used pt denies sleep apnea   Osteoarthritis    Peptic ulcer disease    Pneumonia not recent per pt   Skin cancer    basal cell- face & head, 1 melanoma revoved  from left side of face   Sleep concern    states the study (2003 )was done at [facility]., it failed & he was told to return & he never did. Pt. states he has been found by prev. hosp. staff that he has to be told when to breathe & his wife does the same.    Spinal fracture    hx of traumatic in Jun 04, 2007 after falling off the roof   Tinnitus, bilateral    Vitamin D deficiency     ALLERGIES:  is allergic to codeine; meloxicam; morphine and related; nsaids; oxycodone; crestor [rosuvastatin]; cymbalta [duloxetine hcl]; dilaudid [hydromorphone hcl]; effexor [venlafaxine]; gluten meal; topamax [topiramate]; tramadol hcl; trazodone and nefazodone; adhesive [tape]; penicillins; and sulfa antibiotics.  MEDICATIONS:  Current Outpatient Medications  Medication Sig Dispense Refill   acetaminophen (TYLENOL) 500 MG tablet Take 1 tablet (500 mg total) by mouth every 6 (six) hours as needed. 30 tablet 0   Alpha-Lipoic Acid 200 MG CAPS Take by mouth 2 (two) times daily.     Ascorbic Acid (VITAMIN C) 1000 MG tablet Take 1,000 mg by mouth 2 (two) times daily.     bisoprolol-hydrochlorothiazide (ZIAC) 5-6.25 MG tablet Take 1 tablet by mouth daily. 90 tablet 1   Blood Glucose Monitoring Suppl (FREESTYLE FREEDOM LITE) w/Device KIT Check blood sugar 1 time a day. DX-E11.21 1 each 0   Cholecalciferol (VITAMIN D3) 5000 units CAPS Take 5,000 Units by mouth 2 (two) times daily.     CHOLINE PO Take 600 mg by mouth daily.     diazepam (VALIUM) 5 MG tablet Take 2.5 mg by mouth at bedtime.     DOXYLAMINE SUCCINATE PO Take 0.5 tablets by mouth at bedtime.      folic acid (FOLVITE) 1 MG tablet Take 1 tablet (1 mg total) by mouth daily. 30 tablet 4   Icosapent Ethyl (VASCEPA) 1 g CAPS Take 2 capsules (2 g total) by mouth 2 (two) times a day. 120 capsule 11   ipratropium (ATROVENT) 0.03 % nasal spray Place 1-2 sprays into both nostrils daily as needed (for allergies.). 30 mL 3   levofloxacin  (LEVAQUIN) 500 MG tablet Take 1 tablet (500 mg total) by mouth daily. 10 tablet 0   lidocaine-prilocaine (EMLA) cream Apply 1 application topically as needed. 30 g 0   magnesium oxide (MAG-OX) 400 MG tablet Take 1,200 mg by mouth as needed.     MELATONIN PO Take 30 mg by mouth at bedtime.     Multiple Vitamins-Minerals (MULTIVITAMIN ADULT PO) Take by mouth daily.     nitroGLYCERIN (NITROSTAT) 0.4 MG SL tablet Dissolve 1 tablet under tongue every 3 to 5 minutes if needed for Chest Pain 50 tablet 3   OVER  THE COUNTER MEDICATION Taking OTC Iron 1 tablet daily.     prochlorperazine (COMPAZINE) 10 MG tablet Take 1 tablet (10 mg total) by mouth every 6 (six) hours as needed for nausea or vomiting. 30 tablet 0   Rivaroxaban 15 & 20 MG TBPK Start with one 9m tablet by mouth twice a day with food. On Day 22, switch to one 214mtablet once a day with food. 51 each 0   SUPER B COMPLEX/C PO Take by mouth daily.     tamsulosin (FLOMAX) 0.4 MG CAPS capsule Take 1 capsule Daily for for Prostate 90 capsule 3   tetrahydrozoline-zinc (VISINE-AC) 0.05-0.25 % ophthalmic solution Place 1-2 drops into both eyes 3 (three) times daily as needed (for redness/irritation.).     triamcinolone (NASACORT) 55 MCG/ACT AERO nasal inhaler Place 2 sprays into the nose at bedtime. 1 Inhaler 3   triamcinolone ointment (KENALOG) 0.1 % Apply 1 application topically 2 (two) times daily. 80 g 1   Turmeric 500 MG TABS Take by mouth. Takes 3 in the morning and 3 in the evening     VALERIAN PO Take 400 mg by mouth daily.     No current facility-administered medications for this visit.     SURGICAL HISTORY:  Past Surgical History:  Procedure Laterality Date   BACK SURGERY     x3- last surgery lumbar- 1998- / fusion    blepheroplasty     both eyesx2   C5-T6 posterior fusion     with [place]asis and radius screws   CABlanco2008 & 2012 multiple stents  total 4 times   cataracts      cataracts removed- /w IOL - both eyes    CERVICAL SPINE SURGERY     COLONOSCOPY     ESOPHAGEAL MANOMETRY     HARDWARE REMOVAL  02/20/2012   Procedure: HARDWARE REMOVAL;  Surgeon: GaElaina HoopsMD;  [facility];  Service: Neurosurgery;  Laterality: N/A;  Hardware Removal   HIATAL HERNIA REPAIR  1979, spring 2009   IR IMAGING GUIDED PORT INSERTION  10/29/2018   LAPAROSCOPIC CHOLECYSTECTOMY     & IOC   multiple percutaneous coronary interventions     NASAL SINUS SURGERY     x2, sees Dr. TeBenjamine Molastill having problems, states he uses Benadryl PRN- up to 5 times per day    right temporal artery biopsy     right wrist plate insertion  0163/[id number] ROBOTIC ASSITED PARTIAL NEPHRECTOMY Left 03/02/2018   Procedure: XI ROBOTIC ASSITED LEFT PARTIAL NEPHRECTOMY WITH LYSIS OF ADHESIONS X30 MINUTES.;  Surgeon: HeArdis HughsMD;  Location: [facility];  Service: Urology;  Laterality: Left;  CLAMP TIME FOR KIDNEY 1053-1110 TOTAL 18MINUTES   Rotator cuff surgery Right    SKIN BIOPSY Right 10/31/2017   Chest   TRANSURETHRAL RESECTION OF BLADDER TUMOR N/A 11/03/2017   Procedure: cystoscopy;  Surgeon: OtKathie RhodesMD;  Location: [facility];  Service: Urology;  Laterality: N/A;   UMBILICAL HERNIA REPAIR     UPPER GI ENDOSCOPY     VENTRAL HERNIA REPAIR N/A 03/02/2018   Procedure: LAPAROSCOPIC REPAIR OF INCARCERATED INCISIONAL HERNIA WITH LYSIS OF ADHESIONS;  Surgeon: GrMichael BostonMD;  Location: [facility];  Service: General;  Laterality: N/A;    REVIEW OF SYSTEMS:  Constitutional: positive for fatigue Eyes: negative Ears, nose, mouth, throat, and face: negative Respiratory: positive for dyspnea on exertion Cardiovascular: negative Gastrointestinal: negative Genitourinary:negative Integument/breast: negative Hematologic/lymphatic: negative  Musculoskeletal:negative Neurological: negative Behavioral/Psych: negative Endocrine: negative Allergic/Immunologic: negative   PHYSICAL  EXAMINATION: General appearance: alert, cooperative, fatigued and no distress Head: Normocephalic, without obvious abnormality, atraumatic Neck: no adenopathy, no JVD, supple, symmetrical, trachea midline and thyroid not enlarged, symmetric, no tenderness/mass/nodules Lymph nodes: Cervical, supraclavicular, and axillary nodes normal. Resp: clear to auscultation bilaterally Back: symmetric, no curvature. ROM normal. No CVA tenderness. Cardio: regular rate and rhythm, S1, S2 normal, no murmur, click, rub or gallop GI: soft, non-tender; bowel sounds normal; no masses,  no organomegaly Extremities: extremities normal, atraumatic, no cyanosis or edema Neurologic: Alert and oriented X 3, normal strength and tone. Normal symmetric reflexes. Normal coordination and gait  ECOG PERFORMANCE STATUS: 1 - Symptomatic but completely ambulatory  Blood pressure 127/71, pulse 72, temperature 98.7 F (37.1 C), temperature source Oral, resp. rate 18, height 5' 7" (1.702 m), weight 204 lb 4.8 oz (92.7 kg), SpO2 99 %.  LABORATORY DATA: Lab Results  Component Value Date   WBC 2.8 (L) 12/18/2018   HGB 12.1 (L) 12/18/2018   HCT 34.9 (L) 12/18/2018   MCV 92.8 12/18/2018   PLT 128 (L) 12/18/2018      Chemistry      Component Value Date/Time   NA 132 (L) 12/18/2018 1100   K 4.2 12/18/2018 1100   CL 96 (L) 12/18/2018 1100   CO2 25 12/18/2018 1100   BUN 20 12/18/2018 1100   CREATININE 1.10 12/18/2018 1100   CREATININE 1.10 11/28/2018 0959      Component Value Date/Time   CALCIUM 9.0 12/18/2018 1100   ALKPHOS 75 12/18/2018 1100   AST 29 12/18/2018 1100   ALT 28 12/18/2018 1100   BILITOT 0.4 12/18/2018 1100       RADIOGRAPHIC STUDIES: Ct Chest W Contrast  Addendum Date: 12/18/2018   ADDENDUM REPORT: 12/18/2018 19:29 ADDENDUM: Critical Value/emergent results were called by telephone at the time of interpretation on 12/18/2018 at 7:20 pm to Dr. Julien Nordmann, who verbally acknowledged these results.  Electronically Signed   By: Lovey Newcomer M.D.   On: 12/18/2018 19:29   Result Date: 12/18/2018 CLINICAL DATA:  Patient with history of metastatic lung cancer. Follow-up exam. EXAM: CT CHEST, ABDOMEN, AND PELVIS WITH CONTRAST TECHNIQUE: Multidetector CT imaging of the chest, abdomen and pelvis was performed following the standard protocol during bolus administration of intravenous contrast. CONTRAST:  157m OMNIPAQUE IOHEXOL 300 MG/ML  SOLN COMPARISON:  PET-CT 09/04/2018 FINDINGS: CT CHEST FINDINGS Cardiovascular: Right anterior chest wall Port-A-Cath is present with tip terminating in the superior vena cava. Normal heart size. Trace fluid superior pericardial recess. Thoracic aortic and coronary arterial vascular calcifications. There is a thin filling defect demonstrated within the peripheral right main pulmonary artery extending into the right middle lobe pulmonary arteries compatible with pulmonary embolus (image 123; series 4). Mediastinum/Nodes: No enlarged axillary, mediastinal or hilar lymphadenopathy. The esophagus is normal in appearance. Lungs/Pleura: Central airways are patent. Dependent atelectasis within the bilateral lower lobes. Similar-appearing 1.7 x 1.5 cm spiculated nodule left upper lobe (image 38; series 6). There is a peripheral subpleural triangular-shaped opacity within the right middle lobe (image 97; series 6). Musculoskeletal: Thoracic spine degenerative changes. Patchy sclerosis involving the anterior right seventh rib (image 113; series 7). CT ABDOMEN PELVIS FINDINGS Hepatobiliary: The liver is normal in size and contour. No focal hepatic lesion is identified. Gallbladder is unremarkable. No intrahepatic or extrahepatic biliary ductal dilatation. Pancreas: Unremarkable Spleen: Unremarkable Adrenals/Urinary Tract: Adrenal glands are normal. Similar-appearing stranding throughout the left perirenal fat most  pronounced about the upper pole (image 59; series 2). 4 mm stone inferior pole left  kidney. Right kidney is normal in appearance. No hydronephrosis. Urinary bladder is unremarkable. Stomach/Bowel: Sigmoid colonic diverticulosis. No CT evidence for acute diverticulitis. Postsurgical changes involving the GE junction. New normal morphology of the stomach. No evidence for small bowel obstruction. No free fluid or free intraperitoneal air. Vascular/Lymphatic: Normal caliber abdominal aorta. Peripheral calcified atherosclerotic plaque. No retroperitoneal lymphadenopathy. Reproductive: Heterogeneous prostate. Other: None. Musculoskeletal: Slight interval increase in size of 1.8 cm left ileal sclerotic lesion (image 93; series 2). Slight interval increase in 1.1 cm right ileal sclerotic lesion (image 93; series 2). Interval increase in size of 4.4 cm prominent of left ileal lesion (image 84; series 2), previously 2.9 cm. IMPRESSION: 1. There is a filling defect within the right middle lobe pulmonary artery most compatible with pulmonary embolus. There is a peripheral subpleural opacity within the right middle lobe concerning for pulmonary infarct. 2. Similar-appearing spiculated nodule left upper lobe compatible with primary pulmonary malignancy. 3. Interval increase in size of osseous metastasis predominately involving the pelvis. Sclerotic lesion within the anterior right seventh rib may represent osseous metastasis. 4. Heterogeneous soft tissue attenuation within the left pararenal fat likely postoperative in etiology. Recommend attention on follow-up. 5. Aortic Atherosclerosis (ICD10-I70.0) and Emphysema (ICD10-J43.9). Electronically Signed: By: Lovey Newcomer M.D. On: 12/18/2018 19:11   Ct Abdomen Pelvis W Contrast  Addendum Date: 12/18/2018   ADDENDUM REPORT: 12/18/2018 19:29 ADDENDUM: Critical Value/emergent results were called by telephone at the time of interpretation on 12/18/2018 at 7:20 pm to Dr. Julien Nordmann, who verbally acknowledged these results. Electronically Signed   By: Lovey Newcomer M.D.   On:  12/18/2018 19:29   Result Date: 12/18/2018 CLINICAL DATA:  Patient with history of metastatic lung cancer. Follow-up exam. EXAM: CT CHEST, ABDOMEN, AND PELVIS WITH CONTRAST TECHNIQUE: Multidetector CT imaging of the chest, abdomen and pelvis was performed following the standard protocol during bolus administration of intravenous contrast. CONTRAST:  1104m OMNIPAQUE IOHEXOL 300 MG/ML  SOLN COMPARISON:  PET-CT 09/04/2018 FINDINGS: CT CHEST FINDINGS Cardiovascular: Right anterior chest wall Port-A-Cath is present with tip terminating in the superior vena cava. Normal heart size. Trace fluid superior pericardial recess. Thoracic aortic and coronary arterial vascular calcifications. There is a thin filling defect demonstrated within the peripheral right main pulmonary artery extending into the right middle lobe pulmonary arteries compatible with pulmonary embolus (image 123; series 4). Mediastinum/Nodes: No enlarged axillary, mediastinal or hilar lymphadenopathy. The esophagus is normal in appearance. Lungs/Pleura: Central airways are patent. Dependent atelectasis within the bilateral lower lobes. Similar-appearing 1.7 x 1.5 cm spiculated nodule left upper lobe (image 38; series 6). There is a peripheral subpleural triangular-shaped opacity within the right middle lobe (image 97; series 6). Musculoskeletal: Thoracic spine degenerative changes. Patchy sclerosis involving the anterior right seventh rib (image 113; series 7). CT ABDOMEN PELVIS FINDINGS Hepatobiliary: The liver is normal in size and contour. No focal hepatic lesion is identified. Gallbladder is unremarkable. No intrahepatic or extrahepatic biliary ductal dilatation. Pancreas: Unremarkable Spleen: Unremarkable Adrenals/Urinary Tract: Adrenal glands are normal. Similar-appearing stranding throughout the left perirenal fat most pronounced about the upper pole (image 59; series 2). 4 mm stone inferior pole left kidney. Right kidney is normal in appearance. No  hydronephrosis. Urinary bladder is unremarkable. Stomach/Bowel: Sigmoid colonic diverticulosis. No CT evidence for acute diverticulitis. Postsurgical changes involving the GE junction. New normal morphology of the stomach. No evidence for small bowel obstruction. No free fluid or  free intraperitoneal air. Vascular/Lymphatic: Normal caliber abdominal aorta. Peripheral calcified atherosclerotic plaque. No retroperitoneal lymphadenopathy. Reproductive: Heterogeneous prostate. Other: None. Musculoskeletal: Slight interval increase in size of 1.8 cm left ileal sclerotic lesion (image 93; series 2). Slight interval increase in 1.1 cm right ileal sclerotic lesion (image 93; series 2). Interval increase in size of 4.4 cm prominent of left ileal lesion (image 84; series 2), previously 2.9 cm. IMPRESSION: 1. There is a filling defect within the right middle lobe pulmonary artery most compatible with pulmonary embolus. There is a peripheral subpleural opacity within the right middle lobe concerning for pulmonary infarct. 2. Similar-appearing spiculated nodule left upper lobe compatible with primary pulmonary malignancy. 3. Interval increase in size of osseous metastasis predominately involving the pelvis. Sclerotic lesion within the anterior right seventh rib may represent osseous metastasis. 4. Heterogeneous soft tissue attenuation within the left pararenal fat likely postoperative in etiology. Recommend attention on follow-up. 5. Aortic Atherosclerosis (ICD10-I70.0) and Emphysema (ICD10-J43.9). Electronically Signed: By: Lovey Newcomer M.D. On: 12/18/2018 19:11    ASSESSMENT AND PLAN: This is a very pleasant 73 years old white male recently diagnosed with metastatic non-small cell lung cancer, adenocarcinoma presented with left upper lobe lung nodule in addition to metastatic bone disease diagnosed in April 2020. The molecular studies showed no actionable mutations and PDL 1 expression was negative. The patient also has a  history of renal cell carcinoma status post wedge resection of the left kidney in September 2019. The patient is currently undergoing systemic chemotherapy with carboplatin for AUC of 5, Alimta 500 mg/M2 and Keytruda 200 mg IV every 3 weeks status post 3 cycles. He has been tolerating this treatment well except for fatigue. He had repeat CT scan of the chest, abdomen pelvis performed recently.  I personally and independently reviewed the scan and discussed the results with the patient and his wife.  His scan showed a stable disease except for mild progression of osseous metastasis that need to be monitored closely on the upcoming imaging studies.  The patient is currently asymptomatic but I will consider him for palliative radiotherapy if needed to the symptomatic lesion. I recommended for him to proceed with cycle #4 today as planned. For the recently incidental finding of pulmonary embolism of the right middle lobe pulmonary artery, the patient was a started on treatment with Xeloda 15 mg p.o. twice daily for the first 3 weeks and this will be followed by 20 mg p.o. daily. He will come back for follow-up visit in 3 weeks for evaluation before starting cycle #5. The patient was advised to call immediately if he has any concerning symptoms in the interval. The patient voices understanding of current disease status and treatment options and is in agreement with the current care plan. All questions were answered. The patient knows to call the clinic with any problems, questions or concerns. We can certainly see the patient much sooner if necessary.  Disclaimer: This note was dictated with voice recognition software. Similar sounding words can inadvertently be transcribed and may not be corrected upon review.

## 2018-12-25 ENCOUNTER — Telehealth: Payer: Self-pay | Admitting: *Deleted

## 2018-12-25 ENCOUNTER — Ambulatory Visit
Admission: RE | Admit: 2018-12-25 | Discharge: 2018-12-25 | Disposition: A | Discharge status: Home / Self Care | Payer: Medicare Other | Source: Ambulatory Visit | Attending: Physician Assistant | Admitting: Physician Assistant

## 2018-12-25 ENCOUNTER — Telehealth: Payer: Medicare Other | Admitting: Physician Assistant

## 2018-12-25 ENCOUNTER — Other Ambulatory Visit: Payer: Self-pay

## 2018-12-25 DIAGNOSIS — R059 Cough, unspecified: Secondary | ICD-10-CM

## 2018-12-25 DIAGNOSIS — R05 Cough: Secondary | ICD-10-CM | POA: Diagnosis not present

## 2018-12-25 DIAGNOSIS — R531 Weakness: Secondary | ICD-10-CM | POA: Diagnosis not present

## 2018-12-25 DIAGNOSIS — Z20822 Contact with and (suspected) exposure to covid-19: Secondary | ICD-10-CM

## 2018-12-25 MED ORDER — BUDESONIDE-FORMOTEROL FUMARATE 160-4.5 MCG/ACT IN AERO: INHALATION_SPRAY | RESPIRATORY_TRACT | 12 refills | Status: DC

## 2018-12-25 NOTE — Telephone Encounter (Signed)
I sent a request for Covid 19 test.  What about his lab appt on 12/27/18?  Beth

## 2018-12-25 NOTE — Telephone Encounter (Signed)
73 y.o. WM with history of Coronary atherosclerosis; COPD mixed type ([place]);  Rhinitis, nonallergic, chronic; Left renal mass s/p robotic partial nephrectomy 03/02/2018 secondary to Renal cell carcinoma, left ([place]); Adenocarcinoma of left lung, stage 4 ([facility]) currently in treatment; presents with worsening fatigue and productive cough.   Patient was seen on 07/06 for possible sinus infection. He states he has been on levaquin for 4 days, his sinuses are improving however now he has cough with profound weakness. He is doing chemotherapy right now for lung cancer, following with Dr. Julien Nordmann, had treatment 12/20/18 with carboplatin for AUC of 5, Alimta 500 mg/M2 and Keytruda 200 mg IV. He was started on Xarelto 15 mg BID then 20 mg daily for incidental PE.   He is not having any chest pain.  He has had 1 episode of palpitations with exertion. States he has mainly been sitting/lying down.  He has not checked his temperature but denies subjective chills/fever, he has been having more myalgias.  Wife does all the grocery shopping and has been to her office a few times but otherwise there is no possible COVID exposure.   Lab Results  Component Value Date   WBC 2.8 (L) 12/18/2018   HGB 12.1 (L) 12/18/2018   HCT 34.9 (L) 12/18/2018   MCV 92.8 12/18/2018   PLT 128 (L) 12/18/2018   Lab Results  Component Value Date   CREATININE 1.10 12/18/2018   BUN 20 12/18/2018   NA 132 (L) 12/18/2018   K 4.2 12/18/2018   CL 96 (L) 12/18/2018   CO2 25 12/18/2018   Medications Current Outpatient Medications on File Prior to Visit  Medication Sig  . acetaminophen (TYLENOL) 500 MG tablet Take 1 tablet (500 mg total) by mouth every 6 (six) hours as needed.  . Alpha-Lipoic Acid 200 MG CAPS Take by mouth 2 (two) times daily.  . Ascorbic Acid (VITAMIN C) 1000 MG tablet Take 1,000 mg by mouth 2 (two) times daily.  . bisoprolol-hydrochlorothiazide (ZIAC) 5-6.25 MG tablet Take 1 tablet by mouth daily.  . Blood Glucose  Monitoring Suppl (FREESTYLE FREEDOM LITE) w/Device KIT Check blood sugar 1 time a day. [id number]  . Cholecalciferol (VITAMIN D3) 5000 units CAPS Take 5,000 Units by mouth 2 (two) times daily.  . CHOLINE PO Take 600 mg by mouth daily.  . diazepam (VALIUM) 5 MG tablet Take 2.5 mg by mouth at bedtime.  Marland Kitchen DOXYLAMINE SUCCINATE PO Take 0.5 tablets by mouth at bedtime.   . folic acid (FOLVITE) 1 MG tablet Take 1 tablet (1 mg total) by mouth daily.  Vanessa Kick Ethyl (VASCEPA) 1 g CAPS Take 2 capsules (2 g total) by mouth 2 (two) times a day.  . ipratropium (ATROVENT) 0.03 % nasal spray Place 1-2 sprays into both nostrils daily as needed (for allergies.).  Marland Kitchen levofloxacin (LEVAQUIN) 500 MG tablet Take 1 tablet (500 mg total) by mouth daily.  Marland Kitchen lidocaine-prilocaine (EMLA) cream Apply 1 application topically as needed.  . magnesium oxide (MAG-OX) 400 MG tablet Take 1,200 mg by mouth as needed.  Marland Kitchen MELATONIN PO Take 30 mg by mouth at bedtime.  . Multiple Vitamins-Minerals (MULTIVITAMIN ADULT PO) Take by mouth daily.  . nitroGLYCERIN (NITROSTAT) 0.4 MG SL tablet Dissolve 1 tablet under tongue every 3 to 5 minutes if needed for Chest Pain  . OVER THE COUNTER MEDICATION Taking OTC Iron 1 tablet daily.  . prochlorperazine (COMPAZINE) 10 MG tablet Take 1 tablet (10 mg total) by mouth every 6 (six) hours as  needed for nausea or vomiting.  . Rivaroxaban 15 & 20 MG TBPK Start with one 70m tablet by mouth twice a day with food. On Day 22, switch to one 259mtablet once a day with food.  . SUPER B COMPLEX/C PO Take by mouth daily.  . tamsulosin (FLOMAX) 0.4 MG CAPS capsule Take 1 capsule Daily for for Prostate  . tetrahydrozoline-zinc (VISINE-AC) 0.05-0.25 % ophthalmic solution Place 1-2 drops into both eyes 3 (three) times daily as needed (for redness/irritation.).  . Marland Kitchenriamcinolone (NASACORT) 55 MCG/ACT AERO nasal inhaler Place 2 sprays into the nose at bedtime.  . triamcinolone ointment (KENALOG) 0.1 % Apply 1  application topically 2 (two) times daily.  . Turmeric 500 MG TABS Take by mouth. Takes 3 in the morning and 3 in the evening  . VALERIAN PO Take 400 mg by mouth daily.   No current facility-administered medications on file prior to visit.     Problem list He has Hyperlipidemia; Cluster headache syndrome; Essential hypertension; Coronary atherosclerosis; COPD mixed type ([facility] GERD; Lumbago, Chronic; PUD; BPH/Prostatism; Vitamin D deficiency; Medication management; Rhinitis, nonallergic, chronic; Obesity (BMI 30.0-34.9); Encounter for Medicare annual wellness exam; OSA and COPD overlap syndrome ([facility] Aortic atherosclerosis ([place] Anxiety; Other abnormal glucose; Left renal mass s/p robotic partial nephrectomy 03/02/2018; Recurrent incisional hernia with incarceration s/p lap repair w mesh 03/02/2018; Chronic kidney disease (CKD), stage III (moderate) ([facility] Renal cell carcinoma, left ([facility] Adenocarcinoma of left lung, stage 4 ([facility]Between Goals of care, counseling/discussion; Encounter for antineoplastic chemotherapy; Encounter for antineoplastic immunotherapy; Port-A-Cath in place; and Pulmonary embolism ([facility] their problem list.  Weakness with cough/myalgias Likely chemotherapy related, patient instructed to call the cancer center Finish the levaquin Will send in symbicort for possible COPD exacerbation Will get CXR to rule out pneumonia Suggest calling Dr. MoJulien Nordmannmay need repeat labs, CBC/kidney.  Push fluids, no swelling in legs With cancer DX may need to transition from xarelto to levaquin pending our findings but no CP/palpitations at this time

## 2018-12-25 NOTE — Telephone Encounter (Signed)
-----   Message from Elenor Quinones, [place] sent at 12/25/2018  9:22 AM EDT ----- Regarding: POSS SINUS INFECTION Contact: [phone] Patient came in with sinus infection on 7/16. He feels like it has moved down into his chest & not getting better.   [facility]/ [address] [employer]

## 2018-12-25 NOTE — Telephone Encounter (Signed)
-----   Message from Otila Kluver, RN sent at 12/25/2018  1:36 PM EDT ----- Regarding: covid  19 test Would you please contact this patient to get him tesed for COVID 19 per Dr. Julien Nordmann request? Patient's # is [phone]   Thank you.  Drucie Ip, RN  for Dr. Curt Bears [facility]

## 2018-12-25 NOTE — Telephone Encounter (Signed)
Patient called to ask if cxr was ordered. Advised patient, cxr order in Epic, call [phone], for walk in protocol, then go to [place] for cxr.  Asked Patient to check temp: 97.4, he states battery may be low. Pick up Symbicort at pharmacy to start today. For Acute rescue inhaler- Patient states he does not have Albuterol, has not used in a long time.  Increase fluids. Follow up with oncology. Call with any other concerns. If symptoms worsen go to ED.

## 2018-12-25 NOTE — Telephone Encounter (Signed)
Received call from patient. He states he called his PCP today for ongoing cough, possible sinus infection. He had contacted them last week as well for similar symptoms-cough, fatigue.  Pt states his PCP told him to call here.  Pt had levaquin ordered on 12/17/18 but he did not start until 12/23/18. He states he felt pretty bad and doubled up on his levaquin x 2 days, thinking it would make him feel better, quicker. Advised to follow directions on the bottle and take only 1 x a day as directed. PCP also ordered a CXR for today. Pt to do this.  He has not been tested for COVID 19.  Advised pt to call back to PCP regarding COVID testing, Pt denies fever, has productive cough with yellow secretions, denies SOB but states he notices an increased effort to take deep breaths.  Pt has lab appt only here on 12/27/18.  Any additional instructions?

## 2018-12-25 NOTE — Telephone Encounter (Signed)
Pt scheduled for covid testing 12/26/18 @ 1:15 @ [facility] . Instructions given and order placed

## 2018-12-25 NOTE — Telephone Encounter (Signed)
No.  Let us start with the chest x-ray and COVID-19 testing first.  Thank you.

## 2018-12-26 ENCOUNTER — Other Ambulatory Visit: Payer: Self-pay

## 2018-12-26 DIAGNOSIS — R6889 Other general symptoms and signs: Secondary | ICD-10-CM | POA: Diagnosis not present

## 2018-12-26 DIAGNOSIS — Z20822 Contact with and (suspected) exposure to covid-19: Secondary | ICD-10-CM

## 2018-12-26 MED ORDER — ALBUTEROL SULFATE HFA 108 (90 BASE) MCG/ACT IN AERS: 2.0000 | INHALATION_SPRAY | RESPIRATORY_TRACT | 1 refills | Status: AC | PRN

## 2018-12-27 ENCOUNTER — Inpatient Hospital Stay: Payer: Medicare Other

## 2018-12-27 ENCOUNTER — Other Ambulatory Visit: Payer: Self-pay

## 2018-12-27 DIAGNOSIS — Z5111 Encounter for antineoplastic chemotherapy: Secondary | ICD-10-CM | POA: Diagnosis not present

## 2018-12-27 DIAGNOSIS — C7951 Secondary malignant neoplasm of bone: Secondary | ICD-10-CM | POA: Diagnosis not present

## 2018-12-27 DIAGNOSIS — Z5112 Encounter for antineoplastic immunotherapy: Secondary | ICD-10-CM | POA: Diagnosis not present

## 2018-12-27 DIAGNOSIS — C3412 Malignant neoplasm of upper lobe, left bronchus or lung: Secondary | ICD-10-CM | POA: Diagnosis not present

## 2018-12-27 DIAGNOSIS — Z95828 Presence of other vascular implants and grafts: Secondary | ICD-10-CM

## 2018-12-27 DIAGNOSIS — C3492 Malignant neoplasm of unspecified part of left bronchus or lung: Secondary | ICD-10-CM

## 2018-12-27 DIAGNOSIS — Z79899 Other long term (current) drug therapy: Secondary | ICD-10-CM | POA: Diagnosis not present

## 2018-12-27 LAB — CBC WITH DIFFERENTIAL (CANCER CENTER ONLY)
Abs Immature Granulocytes: 0.01 10*3/uL (ref 0.00–0.07)
Basophils Absolute: 0 10*3/uL (ref 0.0–0.1)
Basophils Relative: 1 %
Eosinophils Absolute: 0 10*3/uL (ref 0.0–0.5)
Eosinophils Relative: 2 %
HCT: 32.9 % — ABNORMAL LOW (ref 39.0–52.0)
Hemoglobin: 11.6 g/dL — ABNORMAL LOW (ref 13.0–17.0)
Immature Granulocytes: 1 %
Lymphocytes Relative: 49 %
Lymphs Abs: 1 10*3/uL (ref 0.7–4.0)
MCH: 32.9 pg (ref 26.0–34.0)
MCHC: 35.3 g/dL (ref 30.0–36.0)
MCV: 93.2 fL (ref 80.0–100.0)
Monocytes Absolute: 0.2 10*3/uL (ref 0.1–1.0)
Monocytes Relative: 8 %
Neutro Abs: 0.8 10*3/uL — ABNORMAL LOW (ref 1.7–7.7)
Neutrophils Relative %: 39 %
Platelet Count: 121 10*3/uL — ABNORMAL LOW (ref 150–400)
RBC: 3.53 MIL/uL — ABNORMAL LOW (ref 4.22–5.81)
RDW: 16.2 % — ABNORMAL HIGH (ref 11.5–15.5)
WBC Count: 2 10*3/uL — ABNORMAL LOW (ref 4.0–10.5)
nRBC: 0 % (ref 0.0–0.2)

## 2018-12-27 LAB — CMP (CANCER CENTER ONLY)
ALT: 25 U/L (ref 0–44)
AST: 34 U/L (ref 15–41)
Albumin: 3.3 g/dL — ABNORMAL LOW (ref 3.5–5.0)
Alkaline Phosphatase: 79 U/L (ref 38–126)
Anion gap: 9 (ref 5–15)
BUN: 31 mg/dL — ABNORMAL HIGH (ref 8–23)
CO2: 23 mmol/L (ref 22–32)
Calcium: 8.8 mg/dL — ABNORMAL LOW (ref 8.9–10.3)
Chloride: 97 mmol/L — ABNORMAL LOW (ref 98–111)
Creatinine: 1.03 mg/dL (ref 0.61–1.24)
GFR, Est AFR Am: >60 mL/min (ref >60)
GFR, Estimated: >60 mL/min (ref >60)
Glucose, Bld: 110 mg/dL — ABNORMAL HIGH (ref 70–99)
Potassium: 4.6 mmol/L (ref 3.5–5.1)
Sodium: 129 mmol/L — ABNORMAL LOW (ref 135–145)
Total Bilirubin: 0.4 mg/dL (ref 0.3–1.2)
Total Protein: 7 g/dL (ref 6.5–8.1)

## 2018-12-27 MED ORDER — HEPARIN SOD (PORK) LOCK FLUSH 100 UNIT/ML IV SOLN
500.0000 [IU] | Freq: Once | INTRAVENOUS | Status: AC
  Administered 2018-12-27: 500 [IU]
  Filled 2018-12-27: qty 5

## 2018-12-27 MED ORDER — SODIUM CHLORIDE 0.9% FLUSH
10.0000 mL | Freq: Once | INTRAVENOUS | Status: AC
  Administered 2018-12-27: 10 mL
  Filled 2018-12-27: qty 10

## 2018-12-30 LAB — NOVEL CORONAVIRUS, NAA: SARS-CoV-2, NAA: NOT DETECTED

## 2019-01-03 ENCOUNTER — Inpatient Hospital Stay: Payer: Medicare Other

## 2019-01-03 ENCOUNTER — Other Ambulatory Visit: Payer: Self-pay

## 2019-01-03 DIAGNOSIS — R5382 Chronic fatigue, unspecified: Secondary | ICD-10-CM

## 2019-01-03 DIAGNOSIS — C3412 Malignant neoplasm of upper lobe, left bronchus or lung: Secondary | ICD-10-CM | POA: Diagnosis not present

## 2019-01-03 DIAGNOSIS — Z5111 Encounter for antineoplastic chemotherapy: Secondary | ICD-10-CM | POA: Diagnosis not present

## 2019-01-03 DIAGNOSIS — Z79899 Other long term (current) drug therapy: Secondary | ICD-10-CM | POA: Diagnosis not present

## 2019-01-03 DIAGNOSIS — Z5112 Encounter for antineoplastic immunotherapy: Secondary | ICD-10-CM | POA: Diagnosis not present

## 2019-01-03 DIAGNOSIS — C3492 Malignant neoplasm of unspecified part of left bronchus or lung: Secondary | ICD-10-CM

## 2019-01-03 DIAGNOSIS — Z95828 Presence of other vascular implants and grafts: Secondary | ICD-10-CM

## 2019-01-03 DIAGNOSIS — C7951 Secondary malignant neoplasm of bone: Secondary | ICD-10-CM | POA: Diagnosis not present

## 2019-01-03 LAB — TSH: TSH: 1.118 u[IU]/mL (ref 0.320–4.118)

## 2019-01-03 MED ORDER — HEPARIN SOD (PORK) LOCK FLUSH 100 UNIT/ML IV SOLN
500.0000 [IU] | Freq: Once | INTRAVENOUS | Status: AC
  Administered 2019-01-03: 500 [IU]
  Filled 2019-01-03: qty 5

## 2019-01-03 MED ORDER — SODIUM CHLORIDE 0.9% FLUSH
10.0000 mL | Freq: Once | INTRAVENOUS | Status: AC
  Administered 2019-01-03: 10 mL
  Filled 2019-01-03: qty 10

## 2019-01-09 ENCOUNTER — Other Ambulatory Visit: Payer: Self-pay | Admitting: Medical Oncology

## 2019-01-09 DIAGNOSIS — C3492 Malignant neoplasm of unspecified part of left bronchus or lung: Secondary | ICD-10-CM

## 2019-01-10 ENCOUNTER — Inpatient Hospital Stay (HOSPITAL_BASED_OUTPATIENT_CLINIC_OR_DEPARTMENT_OTHER): Payer: Medicare Other | Admitting: Internal Medicine

## 2019-01-10 ENCOUNTER — Inpatient Hospital Stay: Payer: Medicare Other

## 2019-01-10 ENCOUNTER — Other Ambulatory Visit: Payer: Self-pay

## 2019-01-10 ENCOUNTER — Encounter: Payer: Self-pay | Admitting: Internal Medicine

## 2019-01-10 VITALS — BP 109/71 | HR 78 | Temp 98.2°F | Resp 18 | Ht 67.0 in | Wt 204.2 lb

## 2019-01-10 DIAGNOSIS — C3411 Malignant neoplasm of upper lobe, right bronchus or lung: Secondary | ICD-10-CM

## 2019-01-10 DIAGNOSIS — Z5112 Encounter for antineoplastic immunotherapy: Secondary | ICD-10-CM | POA: Diagnosis not present

## 2019-01-10 DIAGNOSIS — I2699 Other pulmonary embolism without acute cor pulmonale: Secondary | ICD-10-CM

## 2019-01-10 DIAGNOSIS — Z7901 Long term (current) use of anticoagulants: Secondary | ICD-10-CM

## 2019-01-10 DIAGNOSIS — Z85528 Personal history of other malignant neoplasm of kidney: Secondary | ICD-10-CM

## 2019-01-10 DIAGNOSIS — Z95828 Presence of other vascular implants and grafts: Secondary | ICD-10-CM

## 2019-01-10 DIAGNOSIS — C642 Malignant neoplasm of left kidney, except renal pelvis: Secondary | ICD-10-CM

## 2019-01-10 DIAGNOSIS — C3412 Malignant neoplasm of upper lobe, left bronchus or lung: Secondary | ICD-10-CM | POA: Diagnosis not present

## 2019-01-10 DIAGNOSIS — R0609 Other forms of dyspnea: Secondary | ICD-10-CM | POA: Diagnosis not present

## 2019-01-10 DIAGNOSIS — C3492 Malignant neoplasm of unspecified part of left bronchus or lung: Secondary | ICD-10-CM

## 2019-01-10 DIAGNOSIS — R05 Cough: Secondary | ICD-10-CM

## 2019-01-10 DIAGNOSIS — I10 Essential (primary) hypertension: Secondary | ICD-10-CM

## 2019-01-10 DIAGNOSIS — R5383 Other fatigue: Secondary | ICD-10-CM | POA: Diagnosis not present

## 2019-01-10 DIAGNOSIS — Z5111 Encounter for antineoplastic chemotherapy: Secondary | ICD-10-CM | POA: Diagnosis not present

## 2019-01-10 DIAGNOSIS — Z79899 Other long term (current) drug therapy: Secondary | ICD-10-CM | POA: Diagnosis not present

## 2019-01-10 DIAGNOSIS — C7951 Secondary malignant neoplasm of bone: Secondary | ICD-10-CM

## 2019-01-10 LAB — CBC WITH DIFFERENTIAL (CANCER CENTER ONLY)
Abs Immature Granulocytes: 0.02 10*3/uL (ref 0.00–0.07)
Basophils Absolute: 0 10*3/uL (ref 0.0–0.1)
Basophils Relative: 0 %
Eosinophils Absolute: 0 10*3/uL (ref 0.0–0.5)
Eosinophils Relative: 1 %
HCT: 32.6 % — ABNORMAL LOW (ref 39.0–52.0)
Hemoglobin: 11.2 g/dL — ABNORMAL LOW (ref 13.0–17.0)
Immature Granulocytes: 1 %
Lymphocytes Relative: 38 %
Lymphs Abs: 1.1 10*3/uL (ref 0.7–4.0)
MCH: 33.5 pg (ref 26.0–34.0)
MCHC: 34.4 g/dL (ref 30.0–36.0)
MCV: 97.6 fL (ref 80.0–100.0)
Monocytes Absolute: 0.5 10*3/uL (ref 0.1–1.0)
Monocytes Relative: 17 %
Neutro Abs: 1.3 10*3/uL — ABNORMAL LOW (ref 1.7–7.7)
Neutrophils Relative %: 43 %
Platelet Count: 123 10*3/uL — ABNORMAL LOW (ref 150–400)
RBC: 3.34 MIL/uL — ABNORMAL LOW (ref 4.22–5.81)
RDW: 18.7 % — ABNORMAL HIGH (ref 11.5–15.5)
WBC Count: 3 10*3/uL — ABNORMAL LOW (ref 4.0–10.5)
nRBC: 0 % (ref 0.0–0.2)

## 2019-01-10 LAB — CMP (CANCER CENTER ONLY)
ALT: 30 U/L (ref 0–44)
AST: 32 U/L (ref 15–41)
Albumin: 3.5 g/dL (ref 3.5–5.0)
Alkaline Phosphatase: 83 U/L (ref 38–126)
Anion gap: 8 (ref 5–15)
BUN: 23 mg/dL (ref 8–23)
CO2: 25 mmol/L (ref 22–32)
Calcium: 9 mg/dL (ref 8.9–10.3)
Chloride: 102 mmol/L (ref 98–111)
Creatinine: 1.15 mg/dL (ref 0.61–1.24)
GFR, Est AFR Am: >60 mL/min (ref >60)
GFR, Estimated: >60 mL/min (ref >60)
Glucose, Bld: 114 mg/dL — ABNORMAL HIGH (ref 70–99)
Potassium: 4.2 mmol/L (ref 3.5–5.1)
Sodium: 135 mmol/L (ref 135–145)
Total Bilirubin: 0.3 mg/dL (ref 0.3–1.2)
Total Protein: 6.9 g/dL (ref 6.5–8.1)

## 2019-01-10 MED ORDER — SODIUM CHLORIDE 0.9% FLUSH
10.0000 mL | Freq: Once | INTRAVENOUS | Status: AC
  Administered 2019-01-10: 10 mL
  Filled 2019-01-10: qty 10

## 2019-01-10 MED ORDER — HEPARIN SOD (PORK) LOCK FLUSH 100 UNIT/ML IV SOLN
500.0000 [IU] | Freq: Once | INTRAVENOUS | Status: AC | PRN
  Administered 2019-01-10: 500 [IU]
  Filled 2019-01-10: qty 5

## 2019-01-10 MED ORDER — PROCHLORPERAZINE MALEATE 10 MG PO TABS
10.0000 mg | ORAL_TABLET | Freq: Once | ORAL | Status: AC
  Administered 2019-01-10: 10 mg via ORAL

## 2019-01-10 MED ORDER — SODIUM CHLORIDE 0.9% FLUSH
10.0000 mL | INTRAVENOUS | Status: DC | PRN
  Administered 2019-01-10: 10 mL
  Filled 2019-01-10: qty 10

## 2019-01-10 MED ORDER — PROCHLORPERAZINE MALEATE 10 MG PO TABS
ORAL_TABLET | ORAL | Status: AC
  Filled 2019-01-10: qty 1

## 2019-01-10 MED ORDER — SODIUM CHLORIDE 0.9 % IV SOLN
200.0000 mg | Freq: Once | INTRAVENOUS | Status: AC
  Administered 2019-01-10: 200 mg via INTRAVENOUS
  Filled 2019-01-10: qty 8

## 2019-01-10 MED ORDER — SODIUM CHLORIDE 0.9 % IV SOLN
485.0000 mg/m2 | Freq: Once | INTRAVENOUS | Status: AC
  Administered 2019-01-10: 1000 mg via INTRAVENOUS
  Filled 2019-01-10: qty 40

## 2019-01-10 MED ORDER — SODIUM CHLORIDE 0.9 % IV SOLN
Freq: Once | INTRAVENOUS | Status: AC
  Administered 2019-01-10: 12:00:00 via INTRAVENOUS
  Filled 2019-01-10: qty 250

## 2019-01-10 NOTE — Progress Notes (Signed)
[facility] Telephone:([phone]) [phone]   Fax:(336) [phone]  OFFICE PROGRESS NOTE  [name], MD [address] [place] [address]  DIAGNOSIS:  1) Stage IV (T1c, N0, M1 C) non-small cell lung cancer, adenocarcinoma presented with left upper lobe lung nodule in addition to metastatic disease to the bone diagnosed in April 2020. 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   3) incidental finding of pulmonary embolism involving the right middle lobe pulmonary artery on CT scan on 12/18/2018.  Molecular studies by Guardant 412  STK11Splice Site SNV 9.0% Everolimus,Temsirolimus  [id number] 0.8% None (VUS), No (VUS)  NTRK3 L629L 1.5%  (Synonymous) No (Synonymous)  KRAS G12D 1.1% Binimetinib  CDKN2A H83Y 1.6% Abemaciclib, Palbociclib, Ribociclib  PDL1 TPS was 0%  PRIOR THERAPY: None  CURRENT THERAPY:  1) Systemic chemotherapy with carboplatin for AUC of 5, Alimta 500 mg/M2 and Keytruda 200 mg IV every 3 weeks.  First dose Oct 16, 2018.  Status post 4 cycles.  Starting from cycle #5 the patient will be on maintenance treatment with Alimta and Keytruda. 2) Xarelto initially 15 mg p.o. twice daily for 3 weeks followed by 20 mg p.o. daily.  He started the first dose of this treatment on 12/20/2018.  INTERVAL HISTORY: Darrell MiyoshiSouthern 73y.o. male returns to the clinic today for follow-up visit.  The patient is feeling fine today with no concerning complaints except for mild fatigue and raspy voice he denied having any chest pain but has shortness of breath with exertion with mild cough and no hemoptysis.  He denied having any recent weight loss or night sweats.  He has no nausea, vomiting, diarrhea or constipation.  He denied having any headache or visual changes.  He is here today for evaluation before starting the first cycle of his treatment with maintenance therapy with  Alimta and Keytruda.  MEDICAL HISTORY: Past Medical History:  Diagnosis Date   Anginal pain ([place]    Anxiety    treated /w Xanax for mild depression , using 2 times per day, not PRN   Asthma    Benign prostatic hypertrophy    CAD (coronary artery disease)    pt last left heart cath was in Nov 2008. EF was 55% on left ventriculogram. Circumflex was totally occluded after the 1st obtuse marginal with collaterals supplying the distal circumflex. LAD showed luminal irregularities. The stent in LAD was patent. The RCA showed luminal irregularities. The stent in th emid RCA and PDA were patent. There were no inverventions.    Chest pain    from anxiety last time 1 month ago   Chromophobe renal cell carcinoma ([facility] 02/2018   s/p left kidney wedge resection   Chronic obstructive pulmonary disease (HCC)    asthma   Cluster headache    relative to sinus problems none recnt   Constipation    Depression    DM2 (diabetes mellitus, type 2) ([place]    well with diet and exercise controlled   GERD (gastroesophageal reflux disease)    hiatal hernia. Patient did have a Nissen fundoplication rare   Heart murmur    heard 30 yrs ago   History of blood transfusion    need for Bld. transfusion relative to taking NSAIDS   HTN (hypertension)    pt. followed by [facility], last cardiac visit 2012, one yr. ago   Hyperlipidemia  Joint pain    Lactose intolerance    Left kidney mass    Low back pain    chronic   Metastatic non-small cell lung cancer ([place])    adenocarcinoma, LUL, bone metastasis in 09/2018   Neuromuscular disorder ([place])    nerve involvement in back & upper back relative to hardware in neck    Obesity    OSA and COPD overlap syndrome ([place]) 02/25/2015   no cpap used pt denies sleep apnea   Osteoarthritis    Peptic ulcer disease    Pneumonia not recent per pt   Skin cancer    basal cell- face & head, 1 melanoma revoved from left side of face   Sleep  concern    states the study (2003 )was done at [facility]., it failed & he was told to return & he never did. Pt. states he has been found by prev. hosp. staff that he has to be told when to breathe & his wife does the same.    Spinal fracture    hx of traumatic in Jun 04, 2007 after falling off the roof   Tinnitus, bilateral    Vitamin D deficiency     ALLERGIES:  is allergic to codeine; meloxicam; morphine and related; nsaids; oxycodone; crestor [rosuvastatin]; cymbalta [duloxetine hcl]; dilaudid [hydromorphone hcl]; effexor [venlafaxine]; gluten meal; topamax [topiramate]; tramadol hcl; trazodone and nefazodone; adhesive [tape]; penicillins; and sulfa antibiotics.  MEDICATIONS:  Current Outpatient Medications  Medication Sig Dispense Refill   acetaminophen (TYLENOL) 500 MG tablet Take 1 tablet (500 mg total) by mouth every 6 (six) hours as needed. 30 tablet 0   albuterol (VENTOLIN HFA) 108 (90 Base) MCG/ACT inhaler Inhale 2 puffs into the lungs every 4 (four) hours as needed for wheezing or shortness of breath. 18 g 1   Alpha-Lipoic Acid 200 MG CAPS Take by mouth 2 (two) times daily.     Ascorbic Acid (VITAMIN C) 1000 MG tablet Take 1,000 mg by mouth 2 (two) times daily.     bisoprolol-hydrochlorothiazide (ZIAC) 5-6.25 MG tablet Take 1 tablet by mouth daily. 90 tablet 1   Blood Glucose Monitoring Suppl (FREESTYLE FREEDOM LITE) w/Device KIT Check blood sugar 1 time a day. [id number] 1 each 0   budesonide-formoterol (SYMBICORT) 160-4.5 MCG/ACT inhaler Use twice daily, wash your mouth afterwards to avoid yeast. 1 Inhaler 12   Cholecalciferol (VITAMIN D3) 5000 units CAPS Take 5,000 Units by mouth 2 (two) times daily.     CHOLINE PO Take 600 mg by mouth daily.     diazepam (VALIUM) 5 MG tablet Take 2.5 mg by mouth at bedtime.     DOXYLAMINE SUCCINATE PO Take 0.5 tablets by mouth at bedtime.      folic acid (FOLVITE) 1 MG tablet Take 1 tablet (1 mg total) by mouth daily. 30  tablet 4   Icosapent Ethyl (VASCEPA) 1 g CAPS Take 2 capsules (2 g total) by mouth 2 (two) times a day. 120 capsule 11   ipratropium (ATROVENT) 0.03 % nasal spray Place 1-2 sprays into both nostrils daily as needed (for allergies.). 30 mL 3   levofloxacin (LEVAQUIN) 500 MG tablet Take 1 tablet (500 mg total) by mouth daily. 10 tablet 0   lidocaine-prilocaine (EMLA) cream Apply 1 application topically as needed. 30 g 0   magnesium oxide (MAG-OX) 400 MG tablet Take 1,200 mg by mouth as needed.     MELATONIN PO Take 30 mg by mouth at bedtime.     Multiple  Vitamins-Minerals (MULTIVITAMIN ADULT PO) Take by mouth daily.     nitroGLYCERIN (NITROSTAT) 0.4 MG SL tablet Dissolve 1 tablet under tongue every 3 to 5 minutes if needed for Chest Pain 50 tablet 3   OVER THE COUNTER MEDICATION Taking OTC Iron 1 tablet daily.     prochlorperazine (COMPAZINE) 10 MG tablet Take 1 tablet (10 mg total) by mouth every 6 (six) hours as needed for nausea or vomiting. 30 tablet 0   Rivaroxaban 15 & 20 MG TBPK Start with one '15mg'$  tablet by mouth twice a day with food. On Day 22, switch to one '20mg'$  tablet once a day with food. 51 each 0   SUPER B COMPLEX/C PO Take by mouth daily.     tamsulosin (FLOMAX) 0.4 MG CAPS capsule Take 1 capsule Daily for for Prostate 90 capsule 3   tetrahydrozoline-zinc (VISINE-AC) 0.05-0.25 % ophthalmic solution Place 1-2 drops into both eyes 3 (three) times daily as needed (for redness/irritation.).     triamcinolone (NASACORT) 55 MCG/ACT AERO nasal inhaler Place 2 sprays into the nose at bedtime. 1 Inhaler 3   triamcinolone ointment (KENALOG) 0.1 % Apply 1 application topically 2 (two) times daily. 80 g 1   Turmeric 500 MG TABS Take by mouth. Takes 3 in the morning and 3 in the evening     VALERIAN PO Take 400 mg by mouth daily.     No current facility-administered medications for this visit.     SURGICAL HISTORY:  Past Surgical History:  Procedure Laterality Date    BACK SURGERY     x3- last surgery lumbar- 1998- / fusion    blepheroplasty     both eyesx2   C5-T6 posterior fusion     with [place] and radius screws   [place], 2008 & 2012 multiple stents  total 4 times   cataracts     cataracts removed- /w IOL - both eyes    CERVICAL SPINE SURGERY     COLONOSCOPY     ESOPHAGEAL MANOMETRY     HARDWARE REMOVAL  02/20/2012   Procedure: HARDWARE REMOVAL;  Surgeon: [name], MD;  Location: [facility];  Service: Neurosurgery;  Laterality: N/A;  Hardware Removal   HIATAL HERNIA REPAIR  1979, spring 2009   IR IMAGING GUIDED PORT INSERTION  10/29/2018   LAPAROSCOPIC CHOLECYSTECTOMY     & IOC   multiple percutaneous coronary interventions     NASAL SINUS SURGERY     x2, sees Dr. [name], still having problems, states he uses Benadryl PRN- up to 5 times per day    right temporal artery biopsy     right wrist plate insertion  [id number]/6644   ROBOTIC ASSITED PARTIAL NEPHRECTOMY Left 03/02/2018   Procedure: XI ROBOTIC ASSITED LEFT PARTIAL NEPHRECTOMY WITH LYSIS OF ADHESIONS X30 MINUTES.;  Surgeon: Ardis Hughs, MD;  Location: [facility];  Service: Urology;  Laterality: Left;  CLAMP TIME FOR KIDNEY 1053-1110 TOTAL 18MINUTES   Rotator cuff surgery Right    SKIN BIOPSY Right 10/31/2017   Chest   TRANSURETHRAL RESECTION OF BLADDER TUMOR N/A 11/03/2017   Procedure: cystoscopy;  Surgeon: Kathie Rhodes, MD;  Location: [facility];  Service: Urology;  Laterality: N/A;   UMBILICAL HERNIA REPAIR     UPPER GI ENDOSCOPY     VENTRAL HERNIA REPAIR N/A 03/02/2018   Procedure: LAPAROSCOPIC REPAIR OF INCARCERATED INCISIONAL HERNIA WITH LYSIS OF ADHESIONS;  Surgeon: Michael Boston, MD;  Location: [facility];  Service:  General;  Laterality: N/A;    REVIEW OF SYSTEMS:  A comprehensive review of systems was negative except for: Constitutional: positive for fatigue Ears, nose, mouth, throat, and face: positive for hoarseness Respiratory: positive  for cough and dyspnea on exertion   PHYSICAL EXAMINATION: General appearance: alert, cooperative, fatigued and no distress Head: Normocephalic, without obvious abnormality, atraumatic Neck: no adenopathy, no JVD, supple, symmetrical, trachea midline and thyroid not enlarged, symmetric, no tenderness/mass/nodules Lymph nodes: Cervical, supraclavicular, and axillary nodes normal. Resp: clear to auscultation bilaterally Back: symmetric, no curvature. ROM normal. No CVA tenderness. Cardio: regular rate and rhythm, S1, S2 normal, no murmur, click, rub or gallop GI: soft, non-tender; bowel sounds normal; no masses,  no organomegaly Extremities: extremities normal, atraumatic, no cyanosis or edema  ECOG PERFORMANCE STATUS: 1 - Symptomatic but completely ambulatory  Blood pressure 109/71, pulse 78, temperature 98.2 F (36.8 C), temperature source Oral, resp. rate 18, height '5\' 7"'$  (1.702 m), weight 204 lb 3.2 oz (92.6 kg), SpO2 99 %.  LABORATORY DATA: Lab Results  Component Value Date   WBC 3.0 (L) 01/10/2019   HGB 11.2 (L) 01/10/2019   HCT 32.6 (L) 01/10/2019   MCV 97.6 01/10/2019   PLT 123 (L) 01/10/2019      Chemistry      Component Value Date/Time   NA 129 (L) 12/27/2018 1226   K 4.6 12/27/2018 1226   CL 97 (L) 12/27/2018 1226   CO2 23 12/27/2018 1226   BUN 31 (H) 12/27/2018 1226   CREATININE 1.03 12/27/2018 1226   CREATININE 1.10 11/28/2018 0959      Component Value Date/Time   CALCIUM 8.8 (L) 12/27/2018 1226   ALKPHOS 79 12/27/2018 1226   AST 34 12/27/2018 1226   ALT 25 12/27/2018 1226   BILITOT 0.4 12/27/2018 1226       RADIOGRAPHIC STUDIES: Dg Chest 2 View  Result Date: 12/25/2018 CLINICAL DATA:  Cough. EXAM: CHEST - 2 VIEW COMPARISON:  August 18, 2018 FINDINGS: Patient's known malignancy in the left upper lobe is again identified. Mild opacity in the lateral right lung base is favored represent scar atelectasis. A Port-A-Cath terminates in the central SVC. No  pneumothorax. The cardiomediastinal silhouette is unremarkable. IMPRESSION: 1. Known malignancy in the left upper lobe. Probable scarring in the lateral right lung base. New right Port-A-Cath terminating in the central SVC without pneumothorax. Electronically Signed   By: Dorise Bullion III M.D   On: 12/25/2018 17:01   Ct Chest W Contrast  Addendum Date: 12/18/2018   ADDENDUM REPORT: 12/18/2018 19:29 ADDENDUM: Critical Value/emergent results were called by telephone at the time of interpretation on 12/18/2018 at 7:20 pm to Dr. Julien Nordmann, who verbally acknowledged these results. Electronically Signed   By: Lovey Newcomer M.D.   On: 12/18/2018 19:29   Result Date: 12/18/2018 CLINICAL DATA:  Patient with history of metastatic lung cancer. Follow-up exam. EXAM: CT CHEST, ABDOMEN, AND PELVIS WITH CONTRAST TECHNIQUE: Multidetector CT imaging of the chest, abdomen and pelvis was performed following the standard protocol during bolus administration of intravenous contrast. CONTRAST:  117m OMNIPAQUE IOHEXOL 300 MG/ML  SOLN COMPARISON:  PET-CT 09/04/2018 FINDINGS: CT CHEST FINDINGS Cardiovascular: Right anterior chest wall Port-A-Cath is present with tip terminating in the superior vena cava. Normal heart size. Trace fluid superior pericardial recess. Thoracic aortic and coronary arterial vascular calcifications. There is a thin filling defect demonstrated within the peripheral right main pulmonary artery extending into the right middle lobe pulmonary arteries compatible with pulmonary embolus (image 123;  series 4). Mediastinum/Nodes: No enlarged axillary, mediastinal or hilar lymphadenopathy. The esophagus is normal in appearance. Lungs/Pleura: Central airways are patent. Dependent atelectasis within the bilateral lower lobes. Similar-appearing 1.7 x 1.5 cm spiculated nodule left upper lobe (image 38; series 6). There is a peripheral subpleural triangular-shaped opacity within the right middle lobe (image 97; series 6).  Musculoskeletal: Thoracic spine degenerative changes. Patchy sclerosis involving the anterior right seventh rib (image 113; series 7). CT ABDOMEN PELVIS FINDINGS Hepatobiliary: The liver is normal in size and contour. No focal hepatic lesion is identified. Gallbladder is unremarkable. No intrahepatic or extrahepatic biliary ductal dilatation. Pancreas: Unremarkable Spleen: Unremarkable Adrenals/Urinary Tract: Adrenal glands are normal. Similar-appearing stranding throughout the left perirenal fat most pronounced about the upper pole (image 59; series 2). 4 mm stone inferior pole left kidney. Right kidney is normal in appearance. No hydronephrosis. Urinary bladder is unremarkable. Stomach/Bowel: Sigmoid colonic diverticulosis. No CT evidence for acute diverticulitis. Postsurgical changes involving the GE junction. New normal morphology of the stomach. No evidence for small bowel obstruction. No free fluid or free intraperitoneal air. Vascular/Lymphatic: Normal caliber abdominal aorta. Peripheral calcified atherosclerotic plaque. No retroperitoneal lymphadenopathy. Reproductive: Heterogeneous prostate. Other: None. Musculoskeletal: Slight interval increase in size of 1.8 cm left ileal sclerotic lesion (image 93; series 2). Slight interval increase in 1.1 cm right ileal sclerotic lesion (image 93; series 2). Interval increase in size of 4.4 cm prominent of left ileal lesion (image 84; series 2), previously 2.9 cm. IMPRESSION: 1. There is a filling defect within the right middle lobe pulmonary artery most compatible with pulmonary embolus. There is a peripheral subpleural opacity within the right middle lobe concerning for pulmonary infarct. 2. Similar-appearing spiculated nodule left upper lobe compatible with primary pulmonary malignancy. 3. Interval increase in size of osseous metastasis predominately involving the pelvis. Sclerotic lesion within the anterior right seventh rib may represent osseous metastasis. 4.  Heterogeneous soft tissue attenuation within the left pararenal fat likely postoperative in etiology. Recommend attention on follow-up. 5. Aortic Atherosclerosis (ICD10-I70.0) and Emphysema (ICD10-J43.9). Electronically Signed: By: Lovey Newcomer M.D. On: 12/18/2018 19:11   Ct Abdomen Pelvis W Contrast  Addendum Date: 12/18/2018   ADDENDUM REPORT: 12/18/2018 19:29 ADDENDUM: Critical Value/emergent results were called by telephone at the time of interpretation on 12/18/2018 at 7:20 pm to Dr. Julien Nordmann, who verbally acknowledged these results. Electronically Signed   By: Lovey Newcomer M.D.   On: 12/18/2018 19:29   Result Date: 12/18/2018 CLINICAL DATA:  Patient with history of metastatic lung cancer. Follow-up exam. EXAM: CT CHEST, ABDOMEN, AND PELVIS WITH CONTRAST TECHNIQUE: Multidetector CT imaging of the chest, abdomen and pelvis was performed following the standard protocol during bolus administration of intravenous contrast. CONTRAST:  139m OMNIPAQUE IOHEXOL 300 MG/ML  SOLN COMPARISON:  PET-CT 09/04/2018 FINDINGS: CT CHEST FINDINGS Cardiovascular: Right anterior chest wall Port-A-Cath is present with tip terminating in the superior vena cava. Normal heart size. Trace fluid superior pericardial recess. Thoracic aortic and coronary arterial vascular calcifications. There is a thin filling defect demonstrated within the peripheral right main pulmonary artery extending into the right middle lobe pulmonary arteries compatible with pulmonary embolus (image 123; series 4). Mediastinum/Nodes: No enlarged axillary, mediastinal or hilar lymphadenopathy. The esophagus is normal in appearance. Lungs/Pleura: Central airways are patent. Dependent atelectasis within the bilateral lower lobes. Similar-appearing 1.7 x 1.5 cm spiculated nodule left upper lobe (image 38; series 6). There is a peripheral subpleural triangular-shaped opacity within the right middle lobe (image 97; series 6). Musculoskeletal: Thoracic spine  degenerative  changes. Patchy sclerosis involving the anterior right seventh rib (image 113; series 7). CT ABDOMEN PELVIS FINDINGS Hepatobiliary: The liver is normal in size and contour. No focal hepatic lesion is identified. Gallbladder is unremarkable. No intrahepatic or extrahepatic biliary ductal dilatation. Pancreas: Unremarkable Spleen: Unremarkable Adrenals/Urinary Tract: Adrenal glands are normal. Similar-appearing stranding throughout the left perirenal fat most pronounced about the upper pole (image 59; series 2). 4 mm stone inferior pole left kidney. Right kidney is normal in appearance. No hydronephrosis. Urinary bladder is unremarkable. Stomach/Bowel: Sigmoid colonic diverticulosis. No CT evidence for acute diverticulitis. Postsurgical changes involving the GE junction. New normal morphology of the stomach. No evidence for small bowel obstruction. No free fluid or free intraperitoneal air. Vascular/Lymphatic: Normal caliber abdominal aorta. Peripheral calcified atherosclerotic plaque. No retroperitoneal lymphadenopathy. Reproductive: Heterogeneous prostate. Other: None. Musculoskeletal: Slight interval increase in size of 1.8 cm left ileal sclerotic lesion (image 93; series 2). Slight interval increase in 1.1 cm right ileal sclerotic lesion (image 93; series 2). Interval increase in size of 4.4 cm prominent of left ileal lesion (image 84; series 2), previously 2.9 cm. IMPRESSION: 1. There is a filling defect within the right middle lobe pulmonary artery most compatible with pulmonary embolus. There is a peripheral subpleural opacity within the right middle lobe concerning for pulmonary infarct. 2. Similar-appearing spiculated nodule left upper lobe compatible with primary pulmonary malignancy. 3. Interval increase in size of osseous metastasis predominately involving the pelvis. Sclerotic lesion within the anterior right seventh rib may represent osseous metastasis. 4. Heterogeneous soft tissue attenuation within the  left pararenal fat likely postoperative in etiology. Recommend attention on follow-up. 5. Aortic Atherosclerosis (ICD10-I70.0) and Emphysema (ICD10-J43.9). Electronically Signed: By: Lovey Newcomer M.D. On: 12/18/2018 19:11    ASSESSMENT AND PLAN: This is a very pleasant 73 years old white male recently diagnosed with metastatic non-small cell lung cancer, adenocarcinoma presented with left upper lobe lung nodule in addition to metastatic bone disease diagnosed in April 2020. The molecular studies showed no actionable mutations and PDL 1 expression was negative. The patient also has a history of renal cell carcinoma status post wedge resection of the left kidney in September 2019. The patient is currently undergoing systemic chemotherapy with carboplatin for AUC of 5, Alimta 500 mg/M2 and Keytruda 200 mg IV every 3 weeks status post 4 cycles.  Starting from cycle #5 the patient will be treated with maintenance Alimta and Keytruda every 3 weeks. He has been tolerating his treatment well except for fatigue. I recommended for him to proceed with cycle #5 today as planned. He will come back for follow-up visit in 3 weeks for evaluation before starting cycle #6. For the recently incidental finding of pulmonary embolism of the right middle lobe pulmonary artery, the patient was started on treatment with Xeloda 15 mg p.o. twice daily for the first 3 weeks and this will be followed by 20 mg p.o. daily.  The patient voices understanding of current disease status and treatment options and is in agreement with the current care plan. All questions were answered. The patient knows to call the clinic with any problems, questions or concerns. We can certainly see the patient much sooner if necessary.  Disclaimer: This note was dictated with voice recognition software. Similar sounding words can inadvertently be transcribed and may not be corrected upon review.

## 2019-01-10 NOTE — Patient Instructions (Signed)
[facility] Discharge Instructions for Patients Receiving Chemotherapy  Today you received the following chemotherapy agents: Keytruda, Alimta   To help prevent nausea and vomiting after your treatment, we encourage you to take your nausea medication as directed.    If you develop nausea and vomiting that is not controlled by your nausea medication, call the clinic.   BELOW ARE SYMPTOMS THAT SHOULD BE REPORTED IMMEDIATELY:  *FEVER GREATER THAN 100.5 F  *CHILLS WITH OR WITHOUT FEVER  NAUSEA AND VOMITING THAT IS NOT CONTROLLED WITH YOUR NAUSEA MEDICATION  *UNUSUAL SHORTNESS OF BREATH  *UNUSUAL BRUISING OR BLEEDING  TENDERNESS IN MOUTH AND THROAT WITH OR WITHOUT PRESENCE OF ULCERS  *URINARY PROBLEMS  *BOWEL PROBLEMS  UNUSUAL RASH Items with * indicate a potential emergency and should be followed up as soon as possible.  Feel free to call the clinic should you have any questions or concerns. The clinic phone number is [phone].  Please show the [facility] at check-in to the Emergency Department and triage nurse.  Coronavirus (COVID-19) Are you at risk?  Are you at risk for the Coronavirus (COVID-19)?  To be considered HIGH RISK for Coronavirus (COVID-19), you have to meet the following criteria:  . Traveled to [place], [place], [place], [place] or [place]; or in the [place] to [place], [place], [place], or [place]; and have fever, cough, and shortness of breath within the last 2 weeks of travel OR . Been in close contact with a person diagnosed with COVID-19 within the last 2 weeks and have fever, cough, and shortness of breath . IF YOU DO NOT MEET THESE CRITERIA, YOU ARE CONSIDERED LOW RISK FOR COVID-19.  What to do if you are HIGH RISK for COVID-19?  Marland Kitchen If you are having a medical emergency, call 911. . Seek medical care right away. Before you go to a doctor's office, urgent care or emergency department, call ahead and tell them about  your recent travel, contact with someone diagnosed with COVID-19, and your symptoms. You should receive instructions from your physician's office regarding next steps of care.  . When you arrive at healthcare provider, tell the healthcare staff immediately you have returned from visiting [place], [place], [place], [place] or [place]; or traveled in the [place] to [place], [place], [place], or [place]; in the last two weeks or you have been in close contact with a person diagnosed with COVID-19 in the last 2 weeks.   . Tell the health care staff about your symptoms: fever, cough and shortness of breath. . After you have been seen by a medical provider, you will be either: o Tested for (COVID-19) and discharged home on quarantine except to seek medical care if symptoms worsen, and asked to  - Stay home and avoid contact with others until you get your results (4-5 days)  - Avoid travel on public transportation if possible (such as bus, train, or airplane) or o Sent to the Emergency Department by EMS for evaluation, COVID-19 testing, and possible admission depending on your condition and test results.  What to do if you are LOW RISK for COVID-19?  Reduce your risk of any infection by using the same precautions used for avoiding the common cold or flu:  Marland Kitchen Wash your hands often with soap and warm water for at least 20 seconds.  If soap and water are not readily available, use an alcohol-based hand sanitizer with at least 60% alcohol.  . If  coughing or sneezing, cover your mouth and nose by coughing or sneezing into the elbow areas of your shirt or coat, into a tissue or into your sleeve (not your hands). . Avoid shaking hands with others and consider head nods or verbal greetings only. . Avoid touching your eyes, nose, or mouth with unwashed hands.  . Avoid close contact with people who are sick. . Avoid places or events with large numbers of people in one location, like concerts or sporting  events. . Carefully consider travel plans you have or are making. . If you are planning any travel outside or inside the [name], visit the CDC's Travelers' Health webpage for the latest health notices. . If you have some symptoms but not all symptoms, continue to monitor at home and seek medical attention if your symptoms worsen. . If you are having a medical emergency, call 911.   [place] / e-Visit: [email or web address]         [facility]: [facility]: [phone]                   [facility]: [phone]

## 2019-01-10 NOTE — Progress Notes (Signed)
ANC of 1.3 reported to Dr. Julien Nordmann. Received OK to treat.

## 2019-01-30 ENCOUNTER — Other Ambulatory Visit: Payer: Self-pay | Admitting: Adult Health

## 2019-01-31 ENCOUNTER — Inpatient Hospital Stay: Payer: Medicare Other

## 2019-01-31 ENCOUNTER — Telehealth: Payer: Self-pay | Admitting: Internal Medicine

## 2019-01-31 ENCOUNTER — Encounter: Payer: Self-pay | Admitting: Internal Medicine

## 2019-01-31 ENCOUNTER — Inpatient Hospital Stay: Payer: Medicare Other | Attending: Internal Medicine

## 2019-01-31 ENCOUNTER — Inpatient Hospital Stay (HOSPITAL_BASED_OUTPATIENT_CLINIC_OR_DEPARTMENT_OTHER): Payer: Medicare Other | Admitting: Internal Medicine

## 2019-01-31 ENCOUNTER — Other Ambulatory Visit: Payer: Self-pay

## 2019-01-31 DIAGNOSIS — I25810 Atherosclerosis of coronary artery bypass graft(s) without angina pectoris: Secondary | ICD-10-CM | POA: Diagnosis not present

## 2019-01-31 DIAGNOSIS — Z5111 Encounter for antineoplastic chemotherapy: Secondary | ICD-10-CM | POA: Diagnosis not present

## 2019-01-31 DIAGNOSIS — C3412 Malignant neoplasm of upper lobe, left bronchus or lung: Secondary | ICD-10-CM | POA: Diagnosis not present

## 2019-01-31 DIAGNOSIS — R5382 Chronic fatigue, unspecified: Secondary | ICD-10-CM

## 2019-01-31 DIAGNOSIS — Z5112 Encounter for antineoplastic immunotherapy: Secondary | ICD-10-CM | POA: Insufficient documentation

## 2019-01-31 DIAGNOSIS — C3492 Malignant neoplasm of unspecified part of left bronchus or lung: Secondary | ICD-10-CM

## 2019-01-31 DIAGNOSIS — C3490 Malignant neoplasm of unspecified part of unspecified bronchus or lung: Secondary | ICD-10-CM

## 2019-01-31 DIAGNOSIS — Z79899 Other long term (current) drug therapy: Secondary | ICD-10-CM | POA: Insufficient documentation

## 2019-01-31 DIAGNOSIS — C7951 Secondary malignant neoplasm of bone: Secondary | ICD-10-CM | POA: Diagnosis not present

## 2019-01-31 DIAGNOSIS — Z95828 Presence of other vascular implants and grafts: Secondary | ICD-10-CM

## 2019-01-31 LAB — CMP (CANCER CENTER ONLY)
ALT: 32 U/L (ref 0–44)
AST: 36 U/L (ref 15–41)
Albumin: 3.5 g/dL (ref 3.5–5.0)
Alkaline Phosphatase: 82 U/L (ref 38–126)
Anion gap: 11 (ref 5–15)
BUN: 23 mg/dL (ref 8–23)
CO2: 24 mmol/L (ref 22–32)
Calcium: 8.9 mg/dL (ref 8.9–10.3)
Chloride: 94 mmol/L — ABNORMAL LOW (ref 98–111)
Creatinine: 1.02 mg/dL (ref 0.61–1.24)
GFR, Est AFR Am: >60 mL/min (ref >60)
GFR, Estimated: >60 mL/min (ref >60)
Glucose, Bld: 113 mg/dL — ABNORMAL HIGH (ref 70–99)
Potassium: 4 mmol/L (ref 3.5–5.1)
Sodium: 129 mmol/L — ABNORMAL LOW (ref 135–145)
Total Bilirubin: 0.5 mg/dL (ref 0.3–1.2)
Total Protein: 7 g/dL (ref 6.5–8.1)

## 2019-01-31 LAB — CBC WITH DIFFERENTIAL (CANCER CENTER ONLY)
Abs Immature Granulocytes: 0.03 10*3/uL (ref 0.00–0.07)
Basophils Absolute: 0 10*3/uL (ref 0.0–0.1)
Basophils Relative: 1 %
Eosinophils Absolute: 0 10*3/uL (ref 0.0–0.5)
Eosinophils Relative: 1 %
HCT: 33.8 % — ABNORMAL LOW (ref 39.0–52.0)
Hemoglobin: 11.6 g/dL — ABNORMAL LOW (ref 13.0–17.0)
Immature Granulocytes: 1 %
Lymphocytes Relative: 27 %
Lymphs Abs: 1.1 10*3/uL (ref 0.7–4.0)
MCH: 34.2 pg — ABNORMAL HIGH (ref 26.0–34.0)
MCHC: 34.3 g/dL (ref 30.0–36.0)
MCV: 99.7 fL (ref 80.0–100.0)
Monocytes Absolute: 0.5 10*3/uL (ref 0.1–1.0)
Monocytes Relative: 12 %
Neutro Abs: 2.4 10*3/uL (ref 1.7–7.7)
Neutrophils Relative %: 58 %
Platelet Count: 153 10*3/uL (ref 150–400)
RBC: 3.39 MIL/uL — ABNORMAL LOW (ref 4.22–5.81)
RDW: 17.5 % — ABNORMAL HIGH (ref 11.5–15.5)
WBC Count: 4 10*3/uL (ref 4.0–10.5)
nRBC: 0 % (ref 0.0–0.2)

## 2019-01-31 LAB — TSH: TSH: 0.369 u[IU]/mL (ref 0.320–4.118)

## 2019-01-31 MED ORDER — SODIUM CHLORIDE 0.9 % IV SOLN
200.0000 mg | Freq: Once | INTRAVENOUS | Status: AC
  Administered 2019-01-31: 200 mg via INTRAVENOUS
  Filled 2019-01-31: qty 8

## 2019-01-31 MED ORDER — PROCHLORPERAZINE MALEATE 10 MG PO TABS
10.0000 mg | ORAL_TABLET | Freq: Once | ORAL | Status: AC
  Administered 2019-01-31: 14:00:00 10 mg via ORAL

## 2019-01-31 MED ORDER — CYANOCOBALAMIN 1000 MCG/ML IJ SOLN
INTRAMUSCULAR | Status: AC
  Filled 2019-01-31: qty 1

## 2019-01-31 MED ORDER — SODIUM CHLORIDE 0.9 % IV SOLN
Freq: Once | INTRAVENOUS | Status: DC
  Filled 2019-01-31: qty 250

## 2019-01-31 MED ORDER — SODIUM CHLORIDE 0.9 % IV SOLN
475.0000 mg/m2 | Freq: Once | INTRAVENOUS | Status: AC
  Administered 2019-01-31: 1000 mg via INTRAVENOUS
  Filled 2019-01-31: qty 40

## 2019-01-31 MED ORDER — HEPARIN SOD (PORK) LOCK FLUSH 100 UNIT/ML IV SOLN
500.0000 [IU] | Freq: Once | INTRAVENOUS | Status: AC | PRN
  Administered 2019-01-31: 500 [IU]
  Filled 2019-01-31: qty 5

## 2019-01-31 MED ORDER — SODIUM CHLORIDE 0.9 % IV SOLN
Freq: Once | INTRAVENOUS | Status: AC
  Administered 2019-01-31: 13:00:00 via INTRAVENOUS
  Filled 2019-01-31: qty 250

## 2019-01-31 MED ORDER — CYANOCOBALAMIN 1000 MCG/ML IJ SOLN
1000.0000 ug | Freq: Once | INTRAMUSCULAR | Status: AC
  Administered 2019-01-31: 1000 ug via INTRAMUSCULAR

## 2019-01-31 MED ORDER — SODIUM CHLORIDE 0.9% FLUSH
10.0000 mL | Freq: Once | INTRAVENOUS | Status: AC
  Administered 2019-01-31: 10 mL
  Filled 2019-01-31: qty 10

## 2019-01-31 MED ORDER — PROCHLORPERAZINE MALEATE 10 MG PO TABS
ORAL_TABLET | ORAL | Status: AC
  Filled 2019-01-31: qty 1

## 2019-01-31 MED ORDER — SODIUM CHLORIDE 0.9% FLUSH
10.0000 mL | INTRAVENOUS | Status: DC | PRN
  Administered 2019-01-31: 10 mL
  Filled 2019-01-31: qty 10

## 2019-01-31 NOTE — Patient Instructions (Signed)
[facility] Discharge Instructions for Patients Receiving Chemotherapy  Today you received the following chemotherapy agents Pemetrexed/Alimta and Immunotherapy: Pembrolizumab/Keytruda  To help prevent nausea and vomiting after your treatment, we encourage you to take your nausea medication as directed by your MD.   If you develop nausea and vomiting that is not controlled by your nausea medication, call the clinic.   BELOW ARE SYMPTOMS THAT SHOULD BE REPORTED IMMEDIATELY:  *FEVER GREATER THAN 100.5 F  *CHILLS WITH OR WITHOUT FEVER  NAUSEA AND VOMITING THAT IS NOT CONTROLLED WITH YOUR NAUSEA MEDICATION  *UNUSUAL SHORTNESS OF BREATH  *UNUSUAL BRUISING OR BLEEDING  TENDERNESS IN MOUTH AND THROAT WITH OR WITHOUT PRESENCE OF ULCERS  *URINARY PROBLEMS  *BOWEL PROBLEMS  UNUSUAL RASH Items with * indicate a potential emergency and should be followed up as soon as possible.  Feel free to call the clinic should you have any questions or concerns. The clinic phone number is [phone].  Please show the [facility] at check-in to the Emergency Department and triage nurse.  Coronavirus (COVID-19) Are you at risk?  Are you at risk for the Coronavirus (COVID-19)?  To be considered HIGH RISK for Coronavirus (COVID-19), you have to meet the following criteria:  . Traveled to [place], [place], [place], [place] or [place]; or in the [place] to [place], [place], [place], or [place]; and have fever, cough, and shortness of breath within the last 2 weeks of travel OR . Been in close contact with a person diagnosed with COVID-19 within the last 2 weeks and have fever, cough, and shortness of breath . IF YOU DO NOT MEET THESE CRITERIA, YOU ARE CONSIDERED LOW RISK FOR COVID-19.  What to do if you are HIGH RISK for COVID-19?  Marland Kitchen If you are having a medical emergency, call 911. . Seek medical care right away. Before you go to a doctor's office, urgent care or  emergency department, call ahead and tell them about your recent travel, contact with someone diagnosed with COVID-19, and your symptoms. You should receive instructions from your physician's office regarding next steps of care.  . When you arrive at healthcare provider, tell the healthcare staff immediately you have returned from visiting [place], [place], [place], [place] or [place]; or traveled in the [place] to [place], [place], [place], or [place]; in the last two weeks or you have been in close contact with a person diagnosed with COVID-19 in the last 2 weeks.   . Tell the health care staff about your symptoms: fever, cough and shortness of breath. . After you have been seen by a medical provider, you will be either: o Tested for (COVID-19) and discharged home on quarantine except to seek medical care if symptoms worsen, and asked to  - Stay home and avoid contact with others until you get your results (4-5 days)  - Avoid travel on public transportation if possible (such as bus, train, or airplane) or o Sent to the Emergency Department by EMS for evaluation, COVID-19 testing, and possible admission depending on your condition and test results.  What to do if you are LOW RISK for COVID-19?  Reduce your risk of any infection by using the same precautions used for avoiding the common cold or flu:  Marland Kitchen Wash your hands often with soap and warm water for at least 20 seconds.  If soap and water are not readily available, use an alcohol-based hand sanitizer with at least 60% alcohol.  Marland Kitchen  If coughing or sneezing, cover your mouth and nose by coughing or sneezing into the elbow areas of your shirt or coat, into a tissue or into your sleeve (not your hands). . Avoid shaking hands with others and consider head nods or verbal greetings only. . Avoid touching your eyes, nose, or mouth with unwashed hands.  . Avoid close contact with people who are sick. . Avoid places or events with large numbers  of people in one location, like concerts or sporting events. . Carefully consider travel plans you have or are making. . If you are planning any travel outside or inside the [name], visit the CDC's Travelers' Health webpage for the latest health notices. . If you have some symptoms but not all symptoms, continue to monitor at home and seek medical attention if your symptoms worsen. . If you are having a medical emergency, call 911.   [place] / e-Visit: [email or web address]         [facility]: [facility]: [phone]                   [facility]: [phone]

## 2019-01-31 NOTE — Telephone Encounter (Signed)
Scheduled appointments per 08/20 los, patient received avs and calender.

## 2019-01-31 NOTE — Progress Notes (Signed)
[facility] [phone]   [phone]  OFFICE PROGRESS NOTE  [name], MD [address] [place] [address]  DIAGNOSIS:  1) Stage IV (T1c, N0, M1 C) non-small cell lung cancer, adenocarcinoma presented with left upper lobe lung nodule in addition to metastatic disease to the bone diagnosed in April 2020. 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   3) incidental finding of pulmonary embolism involving the right middle lobe pulmonary artery on CT scan on 12/18/2018.  Molecular studies by [facility] 614  STK11Splice Site SNV 4.3% Everolimus,Temsirolimus  [id number] 0.8% None (VUS), No (VUS)  NTRK3 L629L 1.5%  (Synonymous) No (Synonymous)  KRAS G12D 1.1% Binimetinib  CDKN2A H83Y 1.6% Abemaciclib, Palbociclib, Ribociclib  PDL1 TPS was 0%  PRIOR THERAPY: None  CURRENT THERAPY:  1) Systemic chemotherapy with carboplatin for AUC of 5, Alimta 500 mg/M2 and Keytruda 200 mg IV every 3 weeks.  First dose Oct 16, 2018.  Status post 5 cycles.  Starting from cycle #5 the patient will be on maintenance treatment with Alimta and Keytruda. 2) Xarelto initially 15 mg p.o. twice daily for 3 weeks followed by 20 mg p.o. daily.  He started the first dose of this treatment on 12/20/2018.  INTERVAL HISTORY: [name] 73y.o. male returns to the clinic today for follow-up visit.  The patient is feeling fine today with no concerning complaints except for fatigue.  He also has some mild rectal bleeding as well as some bleeding from the tip of his penis.  He is applying antibiotics ointment to this area.  He is currently on Xarelto with no significant bleeding issues.  He denied having any chest pain, shortness of breath except with exertion with no cough or hemoptysis.  He continues to have mild fatigue after the chemotherapy.  He has no nausea, vomiting,  diarrhea or constipation.  He is here today for evaluation before starting cycle #6 of his treatment.  MEDICAL HISTORY: Past Medical History:  Diagnosis Date   Anginal pain ([facility]    Anxiety    treated /w Xanax for mild depression , using 2 times per day, not PRN   Asthma    Benign prostatic hypertrophy    CAD (coronary artery disease)    pt last left heart cath was in Nov 2008. EF was 55% on left ventriculogram. Circumflex was totally occluded after the 1st obtuse marginal with collaterals supplying the distal circumflex. LAD showed luminal irregularities. The stent in LAD was patent. The RCA showed luminal irregularities. The stent in th emid RCA and PDA were patent. There were no inverventions.    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 Patient did have a Nissen fundoplication rare   Heart murmur    heard 30 yrs ago   History of blood transfusion    need for Bld. transfusion relative to taking NSAIDS   HTN (hypertension)    pt. followed  by [facility], last cardiac visit 2012, one yr. 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 Pt. states he has been found by prev. hosp. staff that he has to be told when to breathe & his wife does the same.    Spinal fracture    hx of traumatic in Jun 04, 2007 after falling off the roof   Tinnitus, bilateral    Vitamin D deficiency     ALLERGIES:  is allergic to codeine; meloxicam; morphine and related; nsaids; oxycodone; crestor [rosuvastatin]; cymbalta [duloxetine hcl]; dilaudid [hydromorphone hcl]; effexor [venlafaxine]; gluten meal; topamax [topiramate]; tramadol hcl; trazodone and nefazodone; adhesive [tape]; penicillins; and sulfa antibiotics.  MEDICATIONS:  Current Outpatient Medications  Medication Sig Dispense Refill   acetaminophen (TYLENOL) 500 MG tablet Take 1 tablet (500 mg total) by mouth every 6 (six) hours as needed. 30 tablet 0   albuterol (VENTOLIN HFA) 108 (90 Base) MCG/ACT inhaler Inhale 2 puffs into the lungs every 4 (four) hours as needed for wheezing or shortness of breath. 18 g 1   Alpha-Lipoic Acid 200 MG CAPS Take by mouth 2 (two) times daily.     Ascorbic Acid (VITAMIN C) 1000 MG tablet Take 1,000 mg by mouth 2 (two) times daily.     bisoprolol-hydrochlorothiazide (ZIAC) 5-6.25 MG tablet Take 1 tablet by mouth daily. 90 tablet 1   Blood Glucose Monitoring Suppl (FREESTYLE FREEDOM LITE) w/Device KIT Check blood sugar 1 time a day. DX-E11.21 1 each 0   budesonide-formoterol (SYMBICORT) 160-4.5 MCG/ACT inhaler Use twice daily, wash your mouth afterwards to avoid yeast. 1 Inhaler 12   Cholecalciferol (VITAMIN D3) 5000 units CAPS Take 5,000 Units by mouth 2 (two) times daily.     CHOLINE PO Take 600 mg by mouth daily.     diazepam (VALIUM) 5 MG tablet Take 1/2 to 1 tablet 2 to 3 x /day as needed for Anxiety 270 tablet 0   DOXYLAMINE SUCCINATE PO Take 0.5  tablets by mouth at bedtime.      folic acid (FOLVITE) 1 MG tablet Take 1 tablet (1 mg total) by mouth daily. 30 tablet 4   Icosapent Ethyl (VASCEPA) 1 g CAPS Take 2 capsules (2 g total) by mouth 2 (two) times a day. 120 capsule 11   ipratropium (ATROVENT) 0.03 % nasal spray Place 1-2 sprays into both nostrils daily as needed (for allergies.). 30 mL 3   levofloxacin (LEVAQUIN) 500 MG tablet Take 1 tablet (500 mg total) by mouth daily. 10 tablet 0   lidocaine-prilocaine (EMLA) cream Apply 1 application topically as needed. 30 g 0   magnesium oxide (MAG-OX) 400 MG tablet Take  1,200 mg by mouth as needed.     MELATONIN PO Take 30 mg by mouth at bedtime.     Multiple Vitamins-Minerals (MULTIVITAMIN ADULT PO) Take by mouth daily.     nitroGLYCERIN (NITROSTAT) 0.4 MG SL tablet Dissolve 1 tablet under tongue every 3 to 5 minutes if needed for Chest Pain 50 tablet 3   OVER THE COUNTER MEDICATION Taking OTC Iron 1 tablet daily.     prochlorperazine (COMPAZINE) 10 MG tablet Take 1 tablet (10 mg total) by mouth every 6 (six) hours as needed for nausea or vomiting. 30 tablet 0   Rivaroxaban 15 & 20 MG TBPK Start with one '15mg'$  tablet by mouth twice a day with food. On Day 22, switch to one '20mg'$  tablet once a day with food. 51 each 0   SUPER B COMPLEX/C PO Take by mouth daily.     tamsulosin (FLOMAX) 0.4 MG CAPS capsule Take 1 capsule Daily for for Prostate 90 capsule 3   tetrahydrozoline-zinc (VISINE-AC) 0.05-0.25 % ophthalmic solution Place 1-2 drops into both eyes 3 (three) times daily as needed (for redness/irritation.).     triamcinolone (NASACORT) 55 MCG/ACT AERO nasal inhaler Place 2 sprays into the nose at bedtime. 1 Inhaler 3   triamcinolone ointment (KENALOG) 0.1 % Apply 1 application topically 2 (two) times daily. 80 g 1   Turmeric 500 MG TABS Take by mouth. Takes 3 in the morning and 3 in the evening     VALERIAN PO Take 400 mg by mouth daily.     No current  facility-administered medications for this visit.     SURGICAL HISTORY:  Past Surgical History:  Procedure Laterality Date   BACK SURGERY     x3- last surgery lumbar- 1998- / fusion    blepheroplasty     both eyesx2   C5-T6 posterior fusion     with [place] and radius screws   [place], 2008 & 2012 multiple stents  total 4 times   cataracts     cataracts removed- /w IOL - both eyes    CERVICAL SPINE SURGERY     COLONOSCOPY     ESOPHAGEAL MANOMETRY     HARDWARE REMOVAL  02/20/2012   Procedure: HARDWARE REMOVAL;  Surgeon: [name], MD;  Location: [facility];  Service: Neurosurgery;  Laterality: N/A;  Hardware Removal   HIATAL HERNIA REPAIR  1979, spring 2009   IR IMAGING GUIDED PORT INSERTION  10/29/2018   LAPAROSCOPIC CHOLECYSTECTOMY     & IOC   multiple percutaneous coronary interventions     NASAL SINUS SURGERY     x2, sees Dr. [name], still having problems, states he uses Benadryl PRN- up to 5 times per day    right temporal artery biopsy     right wrist plate insertion  16/1096   ROBOTIC ASSITED PARTIAL NEPHRECTOMY Left 03/02/2018   Procedure: XI ROBOTIC ASSITED LEFT PARTIAL NEPHRECTOMY WITH LYSIS OF ADHESIONS X30 MINUTES.;  Surgeon: Ardis Hughs, MD;  Location: [facility];  Service: Urology;  Laterality: Left;  CLAMP TIME FOR KIDNEY 1053-1110 TOTAL 18MINUTES   Rotator cuff surgery Right    SKIN BIOPSY Right 10/31/2017   Chest   TRANSURETHRAL RESECTION OF BLADDER TUMOR N/A 11/03/2017   Procedure: cystoscopy;  Surgeon: Kathie Rhodes, MD;  Location: [facility];  Service: Urology;  Laterality: N/A;   UMBILICAL HERNIA REPAIR     UPPER GI ENDOSCOPY     VENTRAL HERNIA REPAIR N/A 03/02/2018  Procedure: LAPAROSCOPIC REPAIR OF INCARCERATED INCISIONAL HERNIA WITH LYSIS OF ADHESIONS;  Surgeon: Michael Boston, MD;  Location: [facility];  Service: General;  Laterality: N/A;    REVIEW OF SYSTEMS:  A comprehensive review of systems was negative  except for: Constitutional: positive for fatigue Respiratory: positive for dyspnea on exertion   PHYSICAL EXAMINATION: General appearance: alert, cooperative, fatigued and no distress Head: Normocephalic, without obvious abnormality, atraumatic Neck: no adenopathy, no JVD, supple, symmetrical, trachea midline and thyroid not enlarged, symmetric, no tenderness/mass/nodules Lymph nodes: Cervical, supraclavicular, and axillary nodes normal. Resp: clear to auscultation bilaterally Back: symmetric, no curvature. ROM normal. No CVA tenderness. Cardio: regular rate and rhythm, S1, S2 normal, no murmur, click, rub or gallop GI: soft, non-tender; bowel sounds normal; no masses,  no organomegaly Extremities: extremities normal, atraumatic, no cyanosis or edema  ECOG PERFORMANCE STATUS: 1 - Symptomatic but completely ambulatory  Blood pressure 122/72, pulse 67, temperature 98.2 F (36.8 C), temperature source Oral, resp. rate 18, weight 208 lb 0.2 oz (94.4 kg), SpO2 98 %.  LABORATORY DATA: Lab Results  Component Value Date   WBC 4.0 01/31/2019   HGB 11.6 (L) 01/31/2019   HCT 33.8 (L) 01/31/2019   MCV 99.7 01/31/2019   PLT 153 01/31/2019      Chemistry      Component Value Date/Time   NA 135 01/10/2019 1041   K 4.2 01/10/2019 1041   CL 102 01/10/2019 1041   CO2 25 01/10/2019 1041   BUN 23 01/10/2019 1041   CREATININE 1.15 01/10/2019 1041   CREATININE 1.10 11/28/2018 0959      Component Value Date/Time   CALCIUM 9.0 01/10/2019 1041   ALKPHOS 83 01/10/2019 1041   AST 32 01/10/2019 1041   ALT 30 01/10/2019 1041   BILITOT 0.3 01/10/2019 1041       RADIOGRAPHIC STUDIES: No results found.  ASSESSMENT AND PLAN: This is a very pleasant 73 years old white male recently diagnosed with metastatic non-small cell lung cancer, adenocarcinoma presented with left upper lobe lung nodule in addition to metastatic bone disease diagnosed in April 2020. The molecular studies showed no actionable  mutations and PDL 1 expression was negative. The patient also has a history of renal cell carcinoma status post wedge resection of the left kidney in September 2019. The patient is currently undergoing systemic chemotherapy with carboplatin for AUC of 5, Alimta 500 mg/M2 and Keytruda 200 mg IV every 3 weeks status post 5 cycles.  Starting from cycle #5 the patient will be treated with maintenance Alimta and Keytruda every 3 weeks. He continues to tolerate this treatment well except for the fatigue. I recommended for him to proceed with cycle #6 today as planned. I will see him back for follow-up visit in 3 weeks for evaluation after repeating CT scan of the chest, abdomen and pelvis for restaging of his disease. For the recently incidental finding of pulmonary embolism of the right middle lobe pulmonary artery, he is currently on Xarelto 20 mg p.o. daily.  He will continue with the same treatment for now. The patient was advised to call immediately if he has any concerning symptoms in the interval. The patient voices understanding of current disease status and treatment options and is in agreement with the current care plan. All questions were answered. The patient knows to call the clinic with any problems, questions or concerns. We can certainly see the patient much sooner if necessary.  Disclaimer: This note was dictated with voice recognition software. Similar  sounding words can inadvertently be transcribed and may not be corrected upon review.

## 2019-02-07 ENCOUNTER — Ambulatory Visit: Payer: Medicare Other | Admitting: Cardiology

## 2019-02-11 ENCOUNTER — Telehealth: Payer: Self-pay | Admitting: Medical Oncology

## 2019-02-11 NOTE — Telephone Encounter (Signed)
Refill xarelto -how long does he need to stay on it . I told him at least 6 months.

## 2019-02-12 ENCOUNTER — Other Ambulatory Visit: Payer: Self-pay | Admitting: Internal Medicine

## 2019-02-12 ENCOUNTER — Telehealth: Payer: Self-pay | Admitting: Medical Oncology

## 2019-02-12 MED ORDER — RIVAROXABAN 20 MG PO TABS: 20.0000 mg | ORAL_TABLET | Freq: Every day | ORAL | 2 refills | Status: DC

## 2019-02-12 MED FILL — XARELTO 20 MG TABLET: 20 | 30 days supply | Qty: 30 | Fill #0

## 2019-02-12 NOTE — Telephone Encounter (Signed)
xarelto refill

## 2019-02-19 ENCOUNTER — Ambulatory Visit (HOSPITAL_COMMUNITY)
Admission: RE | Admit: 2019-02-19 | Discharge: 2019-02-19 | Disposition: A | Discharge status: Home / Self Care | Payer: Medicare Other | Source: Ambulatory Visit | Attending: Internal Medicine | Admitting: Internal Medicine

## 2019-02-19 ENCOUNTER — Other Ambulatory Visit: Payer: Self-pay

## 2019-02-19 DIAGNOSIS — C3412 Malignant neoplasm of upper lobe, left bronchus or lung: Secondary | ICD-10-CM | POA: Diagnosis not present

## 2019-02-19 DIAGNOSIS — M898X8 Other specified disorders of bone, other site: Secondary | ICD-10-CM | POA: Diagnosis not present

## 2019-02-19 DIAGNOSIS — C3490 Malignant neoplasm of unspecified part of unspecified bronchus or lung: Secondary | ICD-10-CM | POA: Insufficient documentation

## 2019-02-19 MED ORDER — IOHEXOL 300 MG/ML  SOLN
100.0000 mL | Freq: Once | INTRAMUSCULAR | Status: AC | PRN
  Administered 2019-02-19: 100 mL via INTRAVENOUS

## 2019-02-19 MED ORDER — SODIUM CHLORIDE (PF) 0.9 % IJ SOLN
INTRAMUSCULAR | Status: AC
  Filled 2019-02-19: qty 50

## 2019-02-19 MED ORDER — HEPARIN SOD (PORK) LOCK FLUSH 100 UNIT/ML IV SOLN
INTRAVENOUS | Status: AC
  Filled 2019-02-19: qty 5

## 2019-02-19 MED ORDER — HEPARIN SOD (PORK) LOCK FLUSH 100 UNIT/ML IV SOLN
500.0000 [IU] | Freq: Once | INTRAVENOUS | Status: AC
  Administered 2019-02-19: 500 [IU] via INTRAVENOUS

## 2019-02-21 ENCOUNTER — Inpatient Hospital Stay: Payer: Medicare Other

## 2019-02-21 ENCOUNTER — Inpatient Hospital Stay (HOSPITAL_BASED_OUTPATIENT_CLINIC_OR_DEPARTMENT_OTHER): Payer: Medicare Other | Admitting: Internal Medicine

## 2019-02-21 ENCOUNTER — Encounter: Payer: Self-pay | Admitting: Internal Medicine

## 2019-02-21 ENCOUNTER — Inpatient Hospital Stay: Payer: Medicare Other | Attending: Internal Medicine

## 2019-02-21 ENCOUNTER — Other Ambulatory Visit: Payer: Self-pay

## 2019-02-21 VITALS — BP 126/81 | HR 73 | Temp 98.5°F | Resp 18 | Ht 67.0 in | Wt 207.2 lb

## 2019-02-21 DIAGNOSIS — Z5112 Encounter for antineoplastic immunotherapy: Secondary | ICD-10-CM

## 2019-02-21 DIAGNOSIS — Z79899 Other long term (current) drug therapy: Secondary | ICD-10-CM | POA: Diagnosis not present

## 2019-02-21 DIAGNOSIS — I25810 Atherosclerosis of coronary artery bypass graft(s) without angina pectoris: Secondary | ICD-10-CM

## 2019-02-21 DIAGNOSIS — C7951 Secondary malignant neoplasm of bone: Secondary | ICD-10-CM | POA: Insufficient documentation

## 2019-02-21 DIAGNOSIS — Z5111 Encounter for antineoplastic chemotherapy: Secondary | ICD-10-CM

## 2019-02-21 DIAGNOSIS — R5382 Chronic fatigue, unspecified: Secondary | ICD-10-CM

## 2019-02-21 DIAGNOSIS — C3412 Malignant neoplasm of upper lobe, left bronchus or lung: Secondary | ICD-10-CM | POA: Insufficient documentation

## 2019-02-21 DIAGNOSIS — C3492 Malignant neoplasm of unspecified part of left bronchus or lung: Secondary | ICD-10-CM

## 2019-02-21 DIAGNOSIS — I10 Essential (primary) hypertension: Secondary | ICD-10-CM | POA: Diagnosis not present

## 2019-02-21 DIAGNOSIS — Z95828 Presence of other vascular implants and grafts: Secondary | ICD-10-CM

## 2019-02-21 LAB — CBC WITH DIFFERENTIAL (CANCER CENTER ONLY)
Abs Immature Granulocytes: 0.01 10*3/uL (ref 0.00–0.07)
Basophils Absolute: 0 10*3/uL (ref 0.0–0.1)
Basophils Relative: 1 %
Eosinophils Absolute: 0.1 10*3/uL (ref 0.0–0.5)
Eosinophils Relative: 3 %
HCT: 34.8 % — ABNORMAL LOW (ref 39.0–52.0)
Hemoglobin: 11.9 g/dL — ABNORMAL LOW (ref 13.0–17.0)
Immature Granulocytes: 0 %
Lymphocytes Relative: 30 %
Lymphs Abs: 1.1 10*3/uL (ref 0.7–4.0)
MCH: 34.8 pg — ABNORMAL HIGH (ref 26.0–34.0)
MCHC: 34.2 g/dL (ref 30.0–36.0)
MCV: 101.8 fL — ABNORMAL HIGH (ref 80.0–100.0)
Monocytes Absolute: 0.5 10*3/uL (ref 0.1–1.0)
Monocytes Relative: 14 %
Neutro Abs: 1.9 10*3/uL (ref 1.7–7.7)
Neutrophils Relative %: 52 %
Platelet Count: 167 10*3/uL (ref 150–400)
RBC: 3.42 MIL/uL — ABNORMAL LOW (ref 4.22–5.81)
RDW: 15.8 % — ABNORMAL HIGH (ref 11.5–15.5)
WBC Count: 3.6 10*3/uL — ABNORMAL LOW (ref 4.0–10.5)
nRBC: 0 % (ref 0.0–0.2)

## 2019-02-21 LAB — CMP (CANCER CENTER ONLY)
ALT: 31 U/L (ref 0–44)
AST: 35 U/L (ref 15–41)
Albumin: 3.5 g/dL (ref 3.5–5.0)
Alkaline Phosphatase: 82 U/L (ref 38–126)
Anion gap: 8 (ref 5–15)
BUN: 19 mg/dL (ref 8–23)
CO2: 27 mmol/L (ref 22–32)
Calcium: 9.6 mg/dL (ref 8.9–10.3)
Chloride: 97 mmol/L — ABNORMAL LOW (ref 98–111)
Creatinine: 1.11 mg/dL (ref 0.61–1.24)
GFR, Est AFR Am: >60 mL/min (ref >60)
GFR, Estimated: >60 mL/min (ref >60)
Glucose, Bld: 105 mg/dL — ABNORMAL HIGH (ref 70–99)
Potassium: 4.3 mmol/L (ref 3.5–5.1)
Sodium: 132 mmol/L — ABNORMAL LOW (ref 135–145)
Total Bilirubin: 0.4 mg/dL (ref 0.3–1.2)
Total Protein: 7.4 g/dL (ref 6.5–8.1)

## 2019-02-21 LAB — TSH: TSH: 0.813 u[IU]/mL (ref 0.320–4.118)

## 2019-02-21 MED ORDER — PROCHLORPERAZINE MALEATE 10 MG PO TABS
ORAL_TABLET | ORAL | Status: AC
  Filled 2019-02-21: qty 1

## 2019-02-21 MED ORDER — HEPARIN SOD (PORK) LOCK FLUSH 100 UNIT/ML IV SOLN
500.0000 [IU] | Freq: Once | INTRAVENOUS | Status: AC | PRN
  Administered 2019-02-21: 16:00:00 500 [IU]
  Filled 2019-02-21: qty 5

## 2019-02-21 MED ORDER — PROCHLORPERAZINE MALEATE 10 MG PO TABS
10.0000 mg | ORAL_TABLET | Freq: Once | ORAL | Status: AC
  Administered 2019-02-21: 14:00:00 10 mg via ORAL

## 2019-02-21 MED ORDER — SODIUM CHLORIDE 0.9 % IV SOLN
Freq: Once | INTRAVENOUS | Status: AC
  Administered 2019-02-21: 14:00:00 via INTRAVENOUS
  Filled 2019-02-21: qty 250

## 2019-02-21 MED ORDER — SODIUM CHLORIDE 0.9% FLUSH
10.0000 mL | INTRAVENOUS | Status: DC | PRN
  Administered 2019-02-21: 16:00:00 10 mL
  Filled 2019-02-21: qty 10

## 2019-02-21 MED ORDER — SODIUM CHLORIDE 0.9% FLUSH
10.0000 mL | Freq: Once | INTRAVENOUS | Status: AC
  Administered 2019-02-21: 10 mL
  Filled 2019-02-21: qty 10

## 2019-02-21 MED ORDER — SODIUM CHLORIDE 0.9 % IV SOLN
200.0000 mg | Freq: Once | INTRAVENOUS | Status: AC
  Administered 2019-02-21: 200 mg via INTRAVENOUS
  Filled 2019-02-21: qty 8

## 2019-02-21 MED ORDER — SODIUM CHLORIDE 0.9 % IV SOLN
480.0000 mg/m2 | Freq: Once | INTRAVENOUS | Status: AC
  Administered 2019-02-21: 1000 mg via INTRAVENOUS
  Filled 2019-02-21: qty 40

## 2019-02-21 NOTE — Patient Instructions (Signed)
[facility] Discharge Instructions for Patients Receiving Chemotherapy  Today you received the following chemotherapy agents Pemetrexed/Alimta and Immunotherapy: Pembrolizumab/Keytruda  To help prevent nausea and vomiting after your treatment, we encourage you to take your nausea medication as directed by your MD.   If you develop nausea and vomiting that is not controlled by your nausea medication, call the clinic.   BELOW ARE SYMPTOMS THAT SHOULD BE REPORTED IMMEDIATELY:  *FEVER GREATER THAN 100.5 F  *CHILLS WITH OR WITHOUT FEVER  NAUSEA AND VOMITING THAT IS NOT CONTROLLED WITH YOUR NAUSEA MEDICATION  *UNUSUAL SHORTNESS OF BREATH  *UNUSUAL BRUISING OR BLEEDING  TENDERNESS IN MOUTH AND THROAT WITH OR WITHOUT PRESENCE OF ULCERS  *URINARY PROBLEMS  *BOWEL PROBLEMS  UNUSUAL RASH Items with * indicate a potential emergency and should be followed up as soon as possible.  Feel free to call the clinic should you have any questions or concerns. The clinic phone number is [phone].  Please show the [facility] at check-in to the Emergency Department and triage nurse.  Coronavirus (COVID-19) Are you at risk?  Are you at risk for the Coronavirus (COVID-19)?  To be considered HIGH RISK for Coronavirus (COVID-19), you have to meet the following criteria:  . Traveled to [place], [place], [place], [place] or [place]; or in the [place] to [place], [place], [place], or [place]; and have fever, cough, and shortness of breath within the last 2 weeks of travel OR . Been in close contact with a person diagnosed with COVID-19 within the last 2 weeks and have fever, cough, and shortness of breath . IF YOU DO NOT MEET THESE CRITERIA, YOU ARE CONSIDERED LOW RISK FOR COVID-19.  What to do if you are HIGH RISK for COVID-19?  Marland Kitchen If you are having a medical emergency, call 911. . Seek medical care right away. Before you go to a doctor's office, urgent care or  emergency department, call ahead and tell them about your recent travel, contact with someone diagnosed with COVID-19, and your symptoms. You should receive instructions from your physician's office regarding next steps of care.  . When you arrive at healthcare provider, tell the healthcare staff immediately you have returned from visiting [place], [place], [place], [place] or [place]; or traveled in the [place] to [place], [place], [place], or [place]; in the last two weeks or you have been in close contact with a person diagnosed with COVID-19 in the last 2 weeks.   . Tell the health care staff about your symptoms: fever, cough and shortness of breath. . After you have been seen by a medical provider, you will be either: o Tested for (COVID-19) and discharged home on quarantine except to seek medical care if symptoms worsen, and asked to  - Stay home and avoid contact with others until you get your results (4-5 days)  - Avoid travel on public transportation if possible (such as bus, train, or airplane) or o Sent to the Emergency Department by EMS for evaluation, COVID-19 testing, and possible admission depending on your condition and test results.  What to do if you are LOW RISK for COVID-19?  Reduce your risk of any infection by using the same precautions used for avoiding the common cold or flu:  Marland Kitchen Wash your hands often with soap and warm water for at least 20 seconds.  If soap and water are not readily available, use an alcohol-based hand sanitizer with at least 60% alcohol.  Marland Kitchen  If coughing or sneezing, cover your mouth and nose by coughing or sneezing into the elbow areas of your shirt or coat, into a tissue or into your sleeve (not your hands). . Avoid shaking hands with others and consider head nods or verbal greetings only. . Avoid touching your eyes, nose, or mouth with unwashed hands.  . Avoid close contact with people who are sick. . Avoid places or events with large numbers  of people in one location, like concerts or sporting events. . Carefully consider travel plans you have or are making. . If you are planning any travel outside or inside the [name], visit the CDC's Travelers' Health webpage for the latest health notices. . If you have some symptoms but not all symptoms, continue to monitor at home and seek medical attention if your symptoms worsen. . If you are having a medical emergency, call 911.   [place] / e-Visit: [email or web address]         [facility]: [facility]: [phone]                   [facility]: [phone]

## 2019-02-21 NOTE — Progress Notes (Signed)
[place] [phone]   [phone]  OFFICE PROGRESS NOTE  Darrell Pinto, MD [address] [place] [address]  DIAGNOSIS:  1) Stage IV (T1c, N0, M1 C) non-small cell lung cancer, adenocarcinoma presented with left upper lobe lung nodule in addition to metastatic disease to the bone diagnosed in April 2020. 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   3) incidental finding of pulmonary embolism involving the right middle lobe pulmonary artery on CT scan on 12/18/2018.  Molecular studies by [facility]  STK11Splice Site SNV 0.1% Everolimus,Temsirolimus  RIT1S120f 0.8% None (VUS), No (VUS)  NTRK3 L629L 1.5%  (Synonymous) No (Synonymous)  KRAS G12D 1.1% Binimetinib  CDKN2A H83Y 1.6% Abemaciclib, Palbociclib, Ribociclib  PDL1 TPS was 0%  PRIOR THERAPY: None  CURRENT THERAPY:  1) Systemic chemotherapy with carboplatin for AUC of 5, Alimta 500 mg/M2 and Keytruda 200 mg IV every 3 weeks.  First dose Oct 16, 2018.  Status post 6 cycles.  Starting from cycle #5 the patient will be on maintenance treatment with Alimta and Keytruda. 2) Xarelto initially 15 mg p.o. twice daily for 3 weeks followed by 20 mg p.o. daily.  He started the first dose of this treatment on 12/20/2018.  INTERVAL HISTORY: Darrell PilleySouthern 73y.o. male returns to the clinic today for follow-up visit.  His wife was available by phone during the visit.  The patient is feeling fine today with no concerning complaints except for mild shortness of breath with exertion and fatigue.  He denied having any current chest pain, shortness of breath, cough or hemoptysis.  He denied having any fever or chills.  He has no nausea, vomiting, diarrhea or constipation.  He has no headache or visual changes.  He has been tolerating his treatment with maintenance Alimta and Keytruda fairly well.  The  patient had repeat CT scan of the chest, abdomen pelvis performed recently and he is here for evaluation and discussion of his scan results.   MEDICAL HISTORY: Past Medical History:  Diagnosis Date   Anginal pain ([place]    Anxiety    treated /w Xanax for mild depression , using 2 times per day, not PRN   Asthma    Benign prostatic hypertrophy    CAD (coronary artery disease)    pt last left heart cath was in Nov 2008. EF was 55% on left ventriculogram. Circumflex was totally occluded after the 1st obtuse marginal with collaterals supplying the distal circumflex. LAD showed luminal irregularities. The stent in LAD was patent. The RCA showed luminal irregularities. The stent in th emid RCA and PDA were patent. There were no inverventions.    Chest pain    from anxiety last time 1 month ago   Chromophobe renal cell carcinoma ([place] 02/[place]   s/p left kidney wedge resection   Chronic obstructive pulmonary disease (HCC)    asthma   Cluster headache    relative to sinus problems none recnt   Constipation    Depression    DM2 (diabetes mellitus, type 2) ([place]    well with diet and exercise controlled   GERD (gastroesophageal reflux disease)    hiatal hernia. Patient did have a Nissen fundoplication rare   Heart murmur    heard 30 yrs ago   History of blood transfusion    need for Bld. transfusion relative to taking NSAIDS  HTN (hypertension)    pt. followed by [facility], last cardiac visit 2012, one yr. 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 Pt. states he has been found by prev. hosp. staff that he has to be told when to breathe & his wife does the same.    Spinal fracture    hx of traumatic in Jun 04, 2007 after falling off the roof   Tinnitus, bilateral    Vitamin D deficiency     ALLERGIES:  is allergic to codeine; meloxicam; morphine and related; nsaids; oxycodone; crestor [rosuvastatin]; cymbalta [duloxetine hcl]; dilaudid [hydromorphone hcl]; effexor [venlafaxine]; gluten meal; topamax [topiramate]; tramadol hcl; trazodone and nefazodone; adhesive [tape]; penicillins; and sulfa antibiotics.  MEDICATIONS:  Current Outpatient Medications  Medication Sig Dispense Refill   acetaminophen (TYLENOL) 500 MG tablet Take 1 tablet (500 mg total) by mouth every 6 (six) hours as needed. 30 tablet 0   albuterol (VENTOLIN HFA) 108 (90 Base) MCG/ACT inhaler Inhale 2 puffs into the lungs every 4 (four) hours as needed for wheezing or shortness of breath. 18 g 1   Alpha-Lipoic Acid 200 MG CAPS Take by mouth 2 (two) times daily.     Ascorbic Acid (VITAMIN C) 1000 MG tablet Take 1,000 mg by mouth 2 (two) times daily.     bisoprolol-hydrochlorothiazide (ZIAC) 5-6.25 MG tablet Take 1 tablet by mouth daily. 90 tablet 1   Blood Glucose Monitoring Suppl (FREESTYLE FREEDOM LITE) w/Device KIT Check blood sugar 1 time a day. [id number] 1 each 0   budesonide-formoterol (SYMBICORT) 160-4.5 MCG/ACT inhaler Use twice daily, wash your mouth afterwards to avoid yeast. 1 Inhaler 12   Cholecalciferol (VITAMIN D3) 5000 units CAPS Take 5,000 Units by mouth 2 (two) times daily.     CHOLINE PO Take 600 mg by mouth daily.     diazepam (VALIUM) 5 MG tablet Take 1/2 to 1 tablet 2 to 3 x /day as needed for Anxiety 270 tablet  0   DOXYLAMINE SUCCINATE PO Take 0.5 tablets by mouth at bedtime.      folic acid (FOLVITE) 1 MG tablet Take 1 tablet (1 mg total) by mouth daily. 30 tablet 4   Icosapent Ethyl (VASCEPA) 1 g CAPS Take 2 capsules (2 g total) by mouth 2 (two) times a day. 120 capsule 11   ipratropium (ATROVENT) 0.03 % nasal spray Place 1-2 sprays into both nostrils daily as needed (for allergies.). 30 mL 3   lidocaine-prilocaine (EMLA) cream Apply 1 application topically as needed. 30 g 0   magnesium oxide (MAG-OX) 400 MG tablet Take 1,200 mg by mouth as needed.     MELATONIN PO  Take 30 mg by mouth at bedtime.     Multiple Vitamins-Minerals (MULTIVITAMIN ADULT PO) Take by mouth daily.     nitroGLYCERIN (NITROSTAT) 0.4 MG SL tablet Dissolve 1 tablet under tongue every 3 to 5 minutes if needed for Chest Pain 50 tablet 3   OVER THE COUNTER MEDICATION Taking OTC Iron 1 tablet daily.     prochlorperazine (COMPAZINE) 10 MG tablet Take 1 tablet (10 mg total) by mouth every 6 (six) hours as needed for nausea or vomiting. 30 tablet 0   rivaroxaban (XARELTO) 20 MG TABS tablet Take 1 tablet (20 mg total) by mouth daily with supper. 30 tablet 2   SUPER B COMPLEX/C PO Take by mouth daily.     tamsulosin (FLOMAX) 0.4 MG CAPS capsule Take 1 capsule Daily for for Prostate 90 capsule 3   tetrahydrozoline-zinc (VISINE-AC) 0.05-0.25 % ophthalmic solution Place 1-2 drops into both eyes 3 (three) times daily as needed (for redness/irritation.).     triamcinolone (NASACORT) 55 MCG/ACT AERO nasal inhaler Place 2 sprays into the nose at bedtime. 1 Inhaler 3   triamcinolone ointment (KENALOG) 0.1 % Apply 1 application topically 2 (two) times daily. 80 g 1   Turmeric 500 MG TABS Take by mouth. Takes 3 in the morning and 3 in the evening     VALERIAN PO Take 400 mg by mouth daily.     No current facility-administered medications for this visit.     SURGICAL HISTORY:  Past Surgical History:  Procedure Laterality Date     BACK SURGERY     x3- last surgery lumbar- 1998- / fusion    blepheroplasty     both eyesx2   C5-T6 posterior fusion     with [place] and radius screws   [place], 2008 & 2012 multiple stents  total 4 times   cataracts     cataracts removed- /w IOL - both eyes    CERVICAL SPINE SURGERY     COLONOSCOPY     ESOPHAGEAL MANOMETRY     HARDWARE REMOVAL  02/20/2012   Procedure: HARDWARE REMOVAL;  Surgeon: [name], MD;  Location: [place] [facility];  Service: Neurosurgery;  Laterality: N/A;  Hardware Removal   HIATAL HERNIA REPAIR  1979, spring 2009   IR IMAGING GUIDED PORT INSERTION  10/29/2018   LAPAROSCOPIC CHOLECYSTECTOMY     & IOC   multiple percutaneous coronary interventions     NASAL SINUS SURGERY     x2, sees Dr. [name], still having problems, states he uses Benadryl PRN- up to 5 times per day    right temporal artery biopsy     right wrist plate insertion  83/1517   ROBOTIC ASSITED PARTIAL NEPHRECTOMY Left 03/02/2018   Procedure: XI ROBOTIC ASSITED LEFT PARTIAL NEPHRECTOMY WITH LYSIS OF ADHESIONS X30 MINUTES.;  Surgeon: Ardis Hughs, MD;  Location: [facility];  Service: Urology;  Laterality: Left;  CLAMP TIME FOR KIDNEY 1053-1110 TOTAL 18MINUTES   Rotator cuff surgery Right    SKIN BIOPSY Right 10/31/2017   Chest   TRANSURETHRAL RESECTION OF BLADDER TUMOR N/A 11/03/2017   Procedure: cystoscopy;  Surgeon: Kathie Rhodes, MD;  Location: [facility];  Service: Urology;  Laterality: N/A;   UMBILICAL HERNIA REPAIR     UPPER GI ENDOSCOPY     VENTRAL HERNIA REPAIR N/A 03/02/2018   Procedure: LAPAROSCOPIC REPAIR OF INCARCERATED INCISIONAL HERNIA WITH LYSIS OF ADHESIONS;  Surgeon: Michael Boston, MD;  Location: [facility];  Service: General;  Laterality: N/A;    REVIEW OF SYSTEMS:  Constitutional: positive for fatigue Eyes: negative Ears, nose, mouth, throat, and face: negative Respiratory: positive for dyspnea on exertion Cardiovascular:  negative Gastrointestinal: negative Genitourinary:negative Integument/breast: negative Hematologic/lymphatic: negative Musculoskeletal:negative Neurological: negative Behavioral/Psych: negative Endocrine: negative Allergic/Immunologic: negative   PHYSICAL EXAMINATION: General appearance: alert, cooperative, fatigued and no distress Head: Normocephalic, without obvious abnormality, atraumatic Neck: no adenopathy, no JVD, supple, symmetrical, trachea midline and thyroid not enlarged, symmetric, no tenderness/mass/nodules Lymph nodes: Cervical, supraclavicular, and axillary nodes normal. Resp: clear to auscultation bilaterally Back: symmetric, no curvature. ROM normal. No CVA tenderness. Cardio: regular rate and rhythm, S1, S2 normal, no murmur, click, rub or gallop GI: soft, non-tender; bowel sounds normal; no masses,  no organomegaly Extremities: extremities normal, atraumatic, no cyanosis or edema Neurologic: Alert and oriented X 3, normal strength and tone. Normal symmetric reflexes. Normal coordination and gait  ECOG PERFORMANCE STATUS: 1 - Symptomatic but completely ambulatory  Blood pressure 126/81, pulse 73, temperature 98.5 F (36.9 C), temperature source Oral, resp. rate 18, height 5' 7"  (1.702 m), weight 207 lb 3.2 oz (94 kg), SpO2 98 %.  LABORATORY DATA: Lab Results  Component Value Date   WBC 3.6 (L) 02/21/2019   HGB 11.9 (L) 02/21/2019   HCT 34.8 (L) 02/21/2019   MCV 101.8 (H) 02/21/2019   PLT 167 02/21/2019      Chemistry      Component Value Date/Time   NA 129 (L) 01/31/2019 1155   K 4.0 01/31/2019 1155   CL 94 (L) 01/31/2019 1155   CO2 24 01/31/2019 1155   BUN 23 01/31/2019 1155   CREATININE 1.02 01/31/2019 1155   CREATININE 1.10 11/28/2018 0959      Component Value Date/Time   CALCIUM 8.9 01/31/2019 1155   ALKPHOS 82 01/31/2019 1155   AST 36 01/31/2019 1155   ALT 32 01/31/2019 1155   BILITOT 0.5 01/31/2019 1155       RADIOGRAPHIC STUDIES: Ct  Chest W Contrast  Result Date: 02/19/2019 CLINICAL DATA:  Restaging metastatic left upper lobe lung cancer EXAM: CT CHEST, ABDOMEN, AND PELVIS WITH CONTRAST TECHNIQUE: Multidetector CT imaging of the chest, abdomen and pelvis was performed following the standard protocol during bolus administration of intravenous contrast. CONTRAST:  155m OMNIPAQUE IOHEXOL 300 MG/ML SOLN, additional oral enteric contrast COMPARISON:  12/18/2018, 08/29/2018 FINDINGS: CT CHEST FINDINGS Cardiovascular: Right chest port catheter. Aortic atherosclerosis. Normal heart size. Three-vessel coronary artery calcifications and/or stents. No pericardial effusion. Previously described right middle lobe pulmonary embolism is not appreciated on current examination. Mediastinum/Nodes: No enlarged mediastinal, hilar, or axillary lymph nodes. Thyroid gland, trachea, and esophagus demonstrate no significant findings. Lungs/Pleura: Interval decrease in size of a spiculated nodule of the left upper lobe, now measuring 1.4 cm, previously 1.8 cm (series 4, image 39). Bandlike scarring in the dependent bilateral lung bases. No pleural effusion or pneumothorax. Musculoskeletal: There is a new or increased sclerotic lesion of the left glenoid (series 2, image 8). Possible subtle sclerotic lesion of the anterior right seventh rib (series 2, image 45). Partially imaged posterior cervicothoracic fusion of approximately C7 through T6. Nonacute fracture deformity of the sternal body. CT ABDOMEN PELVIS FINDINGS Hepatobiliary: No focal liver abnormality is seen. Status post cholecystectomy. No biliary dilatation. Pancreas: Unremarkable. No pancreatic ductal dilatation or surrounding inflammatory changes. Spleen: Normal in size without significant abnormality. Adrenals/Urinary Tract: Adrenal glands are unremarkable. Unchanged postoperative stranding about the superior pole of the left kidney. Nonobstructive inferior pole calculus of the left  kidney. Bladder is  unremarkable. Stomach/Bowel: Surgical clips about the gastric fundus and diaphragmatic hiatus. No evidence of bowel wall thickening, distention, or inflammatory changes. Sigmoid diverticulosis without evidence of acute diverticulitis. Vascular/Lymphatic: Aortic atherosclerosis. No enlarged abdominal or pelvic lymph nodes. Reproductive: No mass or other abnormality. Other: No abdominal wall hernia or abnormality. No abdominopelvic ascites. Musculoskeletal: There multiple mixed lytic and sclerotic osseous lesion of the pelvis, including multiple lesions of the left ilium (series 2, image 83) and a lesion of the right ilium (series 2, image 93). There is increasing sclerosis, including of the soft tissue component, of the dominant lesion of the left ilium. There is additional bone graft harvest site of the right ilium. Posterior lumbar fusion of L3 through L5. IMPRESSION: 1. Interval decrease in size of a spiculated nodule of the left upper lobe, now measuring 1.4 cm, previously 1.8 cm (series 4, image 39), consistent treatment response. 2. There is a new or increasingly sclerotic lesion of the left glenoid (series 2, image 8). Possible subtle sclerotic lesion of the anterior right seventh rib (series 2, image 45), unchanged from prior. 3. There multiple mixed lytic and sclerotic osseous lesion of the pelvis, including multiple lesions of the left ilium (series 2, image 83) and a lesion of the right ilium (series 2, image 93). There is increasing sclerosis, including of the soft tissue component, of the dominant lesion of the left ilium. 4. Above constellation of findings favors treatment response with post treatment sclerosis of underlying osseous metastatic disease. No definite new osseous metastatic disease. 5. Previously described right middle lobe pulmonary embolism is not appreciated on current examination. 6. Other chronic, incidental, and postoperative findings as detailed above. Electronically Signed   By:  Eddie Candle M.D.   On: 02/19/2019 14:52   Ct Abdomen Pelvis W Contrast  Result Date: 02/19/2019 CLINICAL DATA:  Restaging metastatic left upper lobe lung cancer EXAM: CT CHEST, ABDOMEN, AND PELVIS WITH CONTRAST TECHNIQUE: Multidetector CT imaging of the chest, abdomen and pelvis was performed following the standard protocol during bolus administration of intravenous contrast. CONTRAST:  133m OMNIPAQUE IOHEXOL 300 MG/ML SOLN, additional oral enteric contrast COMPARISON:  12/18/2018, 08/29/2018 FINDINGS: CT CHEST FINDINGS Cardiovascular: Right chest port catheter. Aortic atherosclerosis. Normal heart size. Three-vessel coronary artery calcifications and/or stents. No pericardial effusion. Previously described right middle lobe pulmonary embolism is not appreciated on current examination. Mediastinum/Nodes: No enlarged mediastinal, hilar, or axillary lymph nodes. Thyroid gland, trachea, and esophagus demonstrate no significant findings. Lungs/Pleura: Interval decrease in size of a spiculated nodule of the left upper lobe, now measuring 1.4 cm, previously 1.8 cm (series 4, image 39). Bandlike scarring in the dependent bilateral lung bases. No pleural effusion or pneumothorax. Musculoskeletal: There is a new or increased sclerotic lesion of the left glenoid (series 2, image 8). Possible subtle sclerotic lesion of the anterior right seventh rib (series 2, image 45). Partially imaged posterior cervicothoracic fusion of approximately C7 through T6. Nonacute fracture deformity of the sternal body. CT ABDOMEN PELVIS FINDINGS Hepatobiliary: No focal liver abnormality is seen. Status post cholecystectomy. No biliary dilatation. Pancreas: Unremarkable. No pancreatic ductal dilatation or surrounding inflammatory changes. Spleen: Normal in size without significant abnormality. Adrenals/Urinary Tract: Adrenal glands are unremarkable. Unchanged postoperative stranding about the superior pole of the left kidney. Nonobstructive  inferior pole calculus of the left kidney. Bladder is unremarkable. Stomach/Bowel: Surgical clips about the gastric fundus and diaphragmatic hiatus. No evidence of bowel wall thickening, distention, or inflammatory changes. Sigmoid diverticulosis without evidence of  acute diverticulitis. Vascular/Lymphatic: Aortic atherosclerosis. No enlarged abdominal or pelvic lymph nodes. Reproductive: No mass or other abnormality. Other: No abdominal wall hernia or abnormality. No abdominopelvic ascites. Musculoskeletal: There multiple mixed lytic and sclerotic osseous lesion of the pelvis, including multiple lesions of the left ilium (series 2, image 83) and a lesion of the right ilium (series 2, image 93). There is increasing sclerosis, including of the soft tissue component, of the dominant lesion of the left ilium. There is additional bone graft harvest site of the right ilium. Posterior lumbar fusion of L3 through L5. IMPRESSION: 1. Interval decrease in size of a spiculated nodule of the left upper lobe, now measuring 1.4 cm, previously 1.8 cm (series 4, image 39), consistent treatment response. 2. There is a new or increasingly sclerotic lesion of the left glenoid (series 2, image 8). Possible subtle sclerotic lesion of the anterior right seventh rib (series 2, image 45), unchanged from prior. 3. There multiple mixed lytic and sclerotic osseous lesion of the pelvis, including multiple lesions of the left ilium (series 2, image 83) and a lesion of the right ilium (series 2, image 93). There is increasing sclerosis, including of the soft tissue component, of the dominant lesion of the left ilium. 4. Above constellation of findings favors treatment response with post treatment sclerosis of underlying osseous metastatic disease. No definite new osseous metastatic disease. 5. Previously described right middle lobe pulmonary embolism is not appreciated on current examination. 6. Other chronic, incidental, and postoperative  findings as detailed above. Electronically Signed   By: Eddie Candle M.D.   On: 02/19/2019 14:52    ASSESSMENT AND PLAN: This is a very pleasant 73 years old white male recently diagnosed with metastatic non-small cell lung cancer, adenocarcinoma presented with left upper lobe lung nodule in addition to metastatic bone disease diagnosed in April 2020. The molecular studies showed no actionable mutations and PDL 1 expression was negative. The patient also has a history of renal cell carcinoma status post wedge resection of the left kidney in September 2019. The patient is currently undergoing systemic chemotherapy with carboplatin for AUC of 5, Alimta 500 mg/M2 and Keytruda 200 mg IV every 3 weeks status post 5 cycles.  Starting from cycle #5 the patient will be treated with maintenance Alimta and Keytruda every 3 weeks. The patient has been tolerating this treatment well with no concerning complaints except for fatigue. He had repeat CT scan of the chest, abdomen pelvis performed recently.  I personally and independently reviewed the scans and discussed the results with the patient and his wife. His scan showed no concerning findings for disease progression but there was some changes in the bone metastasis likely secondary to healing and treatment effect. I recommended for the patient to continue his current treatment with maintenance Alimta and Keytruda and he will proceed with cycle #7 today. For the recently incidental finding of pulmonary embolism of the right middle lobe pulmonary artery, he is currently on Xarelto 20 mg p.o. daily.  He will continue with the same treatment for now. I will see him back for follow-up visit in 3 weeks for evaluation before starting cycle #8. He was advised to call immediately if he has any other concerning symptoms in the interval. The patient voices understanding of current disease status and treatment options and is in agreement with the current care plan. All  questions were answered. The patient knows to call the clinic with any problems, questions or concerns. We can certainly see the  patient much sooner if necessary.  Disclaimer: This note was dictated with voice recognition software. Similar sounding words can inadvertently be transcribed and may not be corrected upon review.

## 2019-02-21 NOTE — Patient Instructions (Signed)

## 2019-02-25 ENCOUNTER — Telehealth: Payer: Self-pay | Admitting: Internal Medicine

## 2019-02-25 NOTE — Telephone Encounter (Signed)
Patient already on schedule as requested per 9/10 los thru end of October.

## 2019-02-27 ENCOUNTER — Ambulatory Visit: Payer: Self-pay | Admitting: Adult Health

## 2019-02-27 NOTE — Progress Notes (Signed)
MEDICARE ANNUAL WELLNESS VISIT AND FOLLOW UP Assessment:   Medicare annual wellness visit  Atherosclerosis of aorta Control blood pressure, cholesterol, glucose, increase exercise.   Essential hypertension - start checking BP at home, cut in ziac in 1/2 if persistently <110/70 with fatigue - continue medications, DASH diet, exercise and monitor at home. Call if greater than 140/90.  -     CBC with Differential/Platelet -     CMP/GFR -     TSH  Atherosclerosis of coronary artery bypass graft of native heart without angina pectoris Control blood pressure, cholesterol, glucose, increase exercise.   COPD mixed type ([place]) Continue inhalers, avoid smoker/irritants, exercise encouraged  OSA and COPD overlap syndrome ([place]) ? Resolved, weight loss advised, continue to monitor Established with Dr. Brett Fairy  Rhinitis, nonallergic, chronic - Allegra OTC, increase H20, allergy hygiene explained.  PUD Continue PPI/H2 blocker, diet discussed  Gastroesophageal reflux disease, esophagitis presence not specified Continue PPI/H2 blocker, diet discussed  Other abnormal glucose Recent A1Cs at goal Discussed diet/exercise, weight management  Defer A1C; monitor weight, getting frequent CMPs via oncology, defer today; monitor for serum glucose  Chronic cluster headache, not intractable Monitor, avoid triggers  BPH/Prostatism monitor  Hyperlipidemia - he was supposed to trial low dose low frequency rosuvastatin which he misunderstood and took daily; clarified instructions, he will retry prior to upcoming cardiology appointment  - check lipids, decrease fatty foods, increase activity.  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 Alen Blew  Pulmonary Embolism In setting of lung CA, on xarelto 20 mg daily Resolved on CT 02/2019 Likley continue indefinitely due to cancer as risk factor, otherwise unprovoked - defer adjustments to oncology  Anxiety/insomnia Failed multiple agents for insomnia; managing fairly with low dose valium and melatonin good sleep hygiene discussed, increase day time activity   Over 30 minutes of exam, counseling, chart review, and critical decision making was performed  Future Appointments  Date Time Provider [place]  03/12/2019  8:15 AM [facility]  03/14/2019  8:15 AM [facility] ONLY [facility] None  03/14/2019  8:30 AM [facility] [employer] None  03/14/2019  9:15 AM Curt Bears, MD [facility] None  03/14/2019 10:00 AM [facility] INFUSION [facility] None  04/04/2019  8:15 AM [facility] LAB 1 [facility] None  04/04/2019  8:30 AM [facility] [place] FLUSH [facility] None  04/04/2019  9:15 AM Curt Bears, MD [facility] None  04/04/2019 10:00 AM [facility] INFUSION [facility] None  06/03/2019  9:00 AM [name], MD [facility] None     Plan:   During the course of the visit the patient was educated and counseled about appropriate screening and preventive services including:    Pneumococcal vaccine   Influenza vaccine  Prevnar 13  Td vaccine  Screening electrocardiogram  Colorectal cancer screening  Diabetes screening  Glaucoma screening  Nutrition counseling    Subjective:  [name]  [name] is a 73 y.o. male who presents for Medicare Annual Wellness  Visit and 3 month follow up for HTN, hyperlipidemia, prediabetes, and vitamin D Def. Follows with Dr. Assunta Curtis for injections for lower back arthritis. In 1992 , he had PCA/Stents locally by Dr Wynonia Lawman followed at [facility] for Laser angioplasty. Heart cath in 2012 found patent Stents.   He is followed by Dr. Karsten Ro for left renal mass -s/p robotic partial nephrectomy in 02/2018.   In April 2020 he was diagnosed with LUL Adenocarcinoma of Lung with mets to bone and under treatment by Dr Julien Nordmann and on chemotherapy/immunotherapy.   He was incidentally found to have a PE RUL middle artery in 12/2018 and is on xarelto 20 mg daily; not apparent/resolved on most recent CT chest 02/20/2019.   He is following with Dr. Mayer Camel for R shoulder pain; has been recommended total replacement fur deferring for now, gets injections and takes tylenol.   Has history of OSA, NOT on CPAP, but no OSA per neuro on most recent reevaluation.  Reports he is sleeping better at night. Taking 2.5 mg valium and sleep supplements. Has tried trazodone, gabapentin, numerous others in the past and hasn't tolerated.  BMI is Body mass index is 32.73 kg/m., he has been working on diet, trying to cut down on portions, eating small portions every 3 hours to manage nausea; exercise is limited due to dyspnea since lung cancer, has fatigue 2 weeks following infusions.  Wt Readings from Last 3 Encounters:  02/28/19 209 lb (94.8 kg)  02/21/19 207 lb 3.2 oz (94 kg)  01/31/19 208 lb 0.2 oz (94.4 kg)   He admits has not been checking BPs at home, does have a cuff, today their BP is BP: (!) 106/56 He does not workout. He denies chest pain, shortness of breath, dizziness. He does endorse fatigue which he had attributed to cancer.    He is not on cholesterol medication due to hx of reacting very poorly to multiple agents and was hospitalized in the past; he was supposed to be trialing low dose low frequency rosuvastatin 5 mg weekly per cardiology, but he  reports he took daily and didn't tolerate. He is agreeable to retrying this prior to upcoming appointment with them.  His cholesterol is not at goal. The cholesterol last visit was:   Lab Results  Component Value Date   CHOL 219 (H) 11/28/2018   HDL 40 11/28/2018   LDLCALC 135 (H) 11/28/2018   LDLDIRECT 116.0 06/03/2013   TRIG 287 (H) 11/28/2018   CHOLHDL 5.5 (H) 11/28/2018    He was in DM range several years ago, but with diet is back to normal range.  Lab Results  Component Value Date   HGBA1C 5.7 (H) 11/28/2018   Last GFR Lab Results  Component Value Date   GFRNONAA >60 02/21/2019    Patient is on Vitamin D supplement.   Lab Results  Component Value Date   VD25OH 79 11/28/2018    He is getting CBC, CMP, TSH rechecked requently via cancer center   Lab Results  Component Value Date   WBC 3.6 (L) 02/21/2019   HGB 11.9 (L) 02/21/2019   HCT 34.8 (L) 02/21/2019   MCV 101.8 (H) 02/21/2019   PLT 167 02/21/2019     Medication Review: Current Outpatient Medications on File Prior to Visit  Medication Sig Dispense Refill  . acetaminophen (TYLENOL) 500 MG tablet Take 1 tablet (500 mg total) by mouth every 6 (six) hours as  needed. 30 tablet 0  . albuterol (VENTOLIN HFA) 108 (90 Base) MCG/ACT inhaler Inhale 2 puffs into the lungs every 4 (four) hours as needed for wheezing or shortness of breath. 18 g 1  . Alpha-Lipoic Acid 200 MG CAPS Take by mouth 2 (two) times daily.    . Ascorbic Acid (VITAMIN C) 1000 MG tablet Take 1,000 mg by mouth 2 (two) times daily.    . bisoprolol-hydrochlorothiazide (ZIAC) 5-6.25 MG tablet Take 1 tablet by mouth daily. 90 tablet 1  . Blood Glucose Monitoring Suppl (FREESTYLE FREEDOM LITE) w/Device KIT Check blood sugar 1 time a day. [id number] 1 each 0  . budesonide-formoterol (SYMBICORT) 160-4.5 MCG/ACT inhaler Use twice daily, wash your mouth afterwards to avoid yeast. 1 Inhaler 12  . Cholecalciferol (VITAMIN D3) 5000 units CAPS Take 5,000 Units by mouth  2 (two) times daily.    . CHOLINE PO Take 600 mg by mouth daily.    Marland Kitchen CINNAMON PO Take by mouth 2 (two) times daily.    . diazepam (VALIUM) 5 MG tablet Take 1/2 to 1 tablet 2 to 3 x /day as needed for Anxiety 270 tablet 0  . DOXYLAMINE SUCCINATE PO Take 0.5 tablets by mouth at bedtime.     . folic acid (FOLVITE) 1 MG tablet Take 1 tablet (1 mg total) by mouth daily. 30 tablet 4  . Icosapent Ethyl (VASCEPA) 1 g CAPS Take 2 capsules (2 g total) by mouth 2 (two) times a day. 120 capsule 11  . ipratropium (ATROVENT) 0.03 % nasal spray Place 1-2 sprays into both nostrils daily as needed (for allergies.). 30 mL 3  . lidocaine-prilocaine (EMLA) cream Apply 1 application topically as needed. 30 g 0  . magnesium oxide (MAG-OX) 400 MG tablet Take 1,200 mg by mouth as needed.    Marland Kitchen MELATONIN PO Take 30 mg by mouth at bedtime.    . Multiple Vitamins-Minerals (MULTIVITAMIN ADULT PO) Take by mouth daily.    . nitroGLYCERIN (NITROSTAT) 0.4 MG SL tablet Dissolve 1 tablet under tongue every 3 to 5 minutes if needed for Chest Pain 50 tablet 3  . OVER THE COUNTER MEDICATION Taking OTC Iron 1 tablet daily.    . prochlorperazine (COMPAZINE) 10 MG tablet Take 1 tablet (10 mg total) by mouth every 6 (six) hours as needed for nausea or vomiting. 30 tablet 0  . rivaroxaban (XARELTO) 20 MG TABS tablet Take 1 tablet (20 mg total) by mouth daily with supper. 30 tablet 2  . SUPER B COMPLEX/C PO Take by mouth daily.    . tamsulosin (FLOMAX) 0.4 MG CAPS capsule Take 1 capsule Daily for for Prostate 90 capsule 3  . tetrahydrozoline-zinc (VISINE-AC) 0.05-0.25 % ophthalmic solution Place 1-2 drops into both eyes 3 (three) times daily as needed (for redness/irritation.).    Marland Kitchen triamcinolone (NASACORT) 55 MCG/ACT AERO nasal inhaler Place 2 sprays into the nose at bedtime. 1 Inhaler 3  . triamcinolone ointment (KENALOG) 0.1 % Apply 1 application topically 2 (two) times daily. 80 g 1  . Turmeric 500 MG TABS Take by mouth. Takes 3 in  the morning and 3 in the evening    . VALERIAN PO Take 400 mg by mouth daily.    . Zinc 50 MG TABS Take by mouth daily.     No current facility-administered medications on file prior to visit.     Allergies: Allergies  Allergen Reactions  . Codeine Itching and Other (See Comments)    Also hallucinations   .  Meloxicam Rash  . Morphine And Related Other (See Comments)    Hallucinations  . Nsaids Other (See Comments)    BLEEDING--naprosyn  . Oxycodone     Hallucinations  . Crestor [Rosuvastatin] Other (See Comments)    Soreness/aching  . Cymbalta [Duloxetine Hcl] Other (See Comments)    Pt is unsure of reaction type  . Dilaudid [Hydromorphone Hcl] Itching and Other (See Comments)    Hallucinations.  . Effexor [Venlafaxine] Other (See Comments)    Unsure of reactions type  . Gluten Meal Other (See Comments)    Runny nose (constant); bloating.  . Topamax [Topiramate] Other (See Comments)    Caused visual impairments/swelling in eyes--pinch off optic nerve (temporary blindness)  . Tramadol Hcl   . Trazodone And Nefazodone Other (See Comments)    Unsure of reaction type  . Adhesive [Tape] Other (See Comments)    SKIN PEELS--PAPER TAPE IS OKAY  . Penicillins Rash  . Sulfa Antibiotics Swelling, Rash and Other (See Comments)    Mouth sores    Current Problems (verified) has Hyperlipidemia; Cluster headache syndrome; Essential hypertension; Coronary atherosclerosis; COPD mixed type ([place]); GERD; Lumbago, Chronic; PUD; BPH/Prostatism; Vitamin D deficiency; Medication management; Rhinitis, nonallergic, chronic; Obesity (BMI 30.0-34.9); Encounter for Medicare annual wellness exam; OSA and COPD overlap syndrome ([place]); Aortic atherosclerosis ([place]); Anxiety; Other abnormal glucose; Left renal mass s/p robotic partial nephrectomy 03/02/2018; Recurrent incisional hernia with incarceration s/p lap repair w mesh 03/02/2018; Chronic kidney disease (CKD), stage III (moderate) ([place]); Renal cell  carcinoma, left ([place]); Adenocarcinoma of left lung, stage 4 ([place]); Goals of care, counseling/discussion; Encounter for antineoplastic chemotherapy; Encounter for antineoplastic immunotherapy; Port-A-Cath in place; and Pulmonary embolism ([place]) on their problem list.  Screening Tests Immunization History  Administered Date(s) Administered  . DT 12/09/2014  . Influenza Split 02/17/2012  . Influenza, High Dose Seasonal PF 03/03/2014, 03/11/2015, 04/04/2016, 04/28/2017, 03/05/2018, 02/28/2019  . Pneumococcal Conjugate-13 12/25/2014  . Pneumococcal Polysaccharide-23 01/19/2012  . Pneumococcal-Unspecified 06/13/1997  . Td 06/13/2004  . Zoster 06/14/2007   Preventative care: Last colonoscopy: 03/2016, due 2022  Prior vaccinations: TD or Tdap: 6/16 Influenza: 2019, DUE Pneumococcal: 8/13 Prevnar13: 2016 Shingles/Zostavax: 2009  Names of Other Physician/Practitioners you currently use: 1. [facility] here for primary care 2. Dr. Katy Fitch, eye doctor, last visit 02/13/2018 - due, has scheduled 3. 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, community, last visit 2019  Patient Care Team: [name], MD as PCP - General (Internal Medicine) Dorothy Spark, MD as PCP - Cardiology (Cardiology) Kathie Rhodes, MD as Consulting Physician (Urology) Warden Fillers, MD as Consulting Physician (Optometry) Larey Dresser, MD as Consulting Physician (Cardiology) Laurence Spates, MD as Consulting Physician (Gastroenterology) Deneise Lever, MD as Consulting Physician (Pulmonary Disease) Dorothy Spark, MD as Consulting Physician (Cardiology) Michael Boston, MD as Consulting Physician (General Surgery) Ardis Hughs, MD as Attending Physician (Urology) Laurence Spates, MD as Consulting Physician (Gastroenterology) Frederik Pear, MD as Consulting Physician (Orthopedic Surgery)  Surgical: He  has a past surgical history that includes Hiatal hernia repair (1979, spring 2009);  right temporal artery biopsy; C5-T6 posterior fusion; Esophageal manometry; Laparoscopic cholecystectomy; Umbilical hernia repair; multiple percutaneous coronary interventions; cataracts; blepheroplasty; Nasal sinus surgery; Hardware Removal (02/20/2012); Rotator cuff surgery (Right); Cardiac catheterization; Cervical spine surgery; Colonoscopy; Upper gi endoscopy; Skin biopsy (Right, 10/31/2017); Transurethral resection of bladder tumor (N/A, 11/03/2017); Back surgery; right wrist plate insertion (40/1027); Ventral hernia repair (N/A, 03/02/2018); Robotic assited partial nephrectomy (Left, 03/02/2018); and IR IMAGING GUIDED PORT INSERTION (  10/29/2018). Family His family history includes Coronary artery disease in his father; Gout in his sister; Heart attack in his brother, brother, father, and mother; Heart failure in his mother; Hypertension in his father and mother; Obesity in his mother. Social history  He reports that he quit smoking about 41 years ago. His smoking use included cigarettes. He has a 20.00 pack-year smoking history. He has never used smokeless tobacco. He reports previous alcohol use. He reports that he does not use drugs.  MEDICARE WELLNESS OBJECTIVES: Physical activity: Current Exercise Habits: The patient does not participate in regular exercise at present, Exercise limited by: respiratory conditions(s) Cardiac risk factors: Cardiac Risk Factors include: advanced age (>49mn, >>8women);male gender;hypertension;obesity (BMI >30kg/m2);sedentary lifestyle;dyslipidemia;smoking/ tobacco exposure Depression/mood screen:   [facility]  Decreased Interest 0  Down, Depressed, Hopeless 1  PHQ - 2 Score 1  Altered sleeping -  Tired, decreased energy -  Change in appetite -  Feeling bad or failure about yourself  -  Trouble concentrating -  Moving slowly or fidgety/restless -  Suicidal thoughts -  PHQ-9 Score -  Difficult doing work/chores -  Some recent data  might be hidden    ADLs:  In your present state of health, do you have any difficulty performing the following activities: 02/28/2019 11/28/2018  Hearing? N N  Vision? N N  Difficulty concentrating or making decisions? N N  Walking or climbing stairs? N N  Dressing or bathing? N N  Doing errands, shopping? N N  Some recent data might be hidden     Cognitive Testing  Alert? Yes  Normal Appearance?Yes  Oriented to person? Yes  Place? Yes   Time? Yes  Recall of three objects?  Yes  Can perform simple calculations? Yes  Displays appropriate judgment?Yes  Can read the correct time from a watch face?Yes  EOL planning: Does Patient Have a Medical Advance Directive?: Yes Type of Advance Directive: Living will, Healthcare Power of Attorney Does patient want to make changes to medical advance directive?: Yes (MAU/Ambulatory/Procedural Areas - Information given) [place] of [place]?: No - copy requested   Objective:   Today's Vitals   02/28/19 1119  BP: (!) 106/56  Pulse: 75  Temp: (!) 97 F (36.1 C)  SpO2: 97%  Weight: 209 lb (94.8 kg)  Height: _0  (1.702 m)   Body mass index is 32.73 kg/m.  General appearance: alert, no distress, WD/WN, male HEENT: normocephalic, sclerae anicteric, TMs pearly, nares patent, no discharge or erythema, pharynx normal Oral cavity: MMM, no lesions Neck: supple, no lymphadenopathy, no thyromegaly, no masses Heart: RRR, normal S1, S2, no murmurs Lungs: CTA bilaterally, no wheezes, rhonchi, or rales Abdomen: +bs, soft, non tender, non distended, no masses, no hepatomegaly, no splenomegaly Musculoskeletal: nontender, no swelling, no obvious deformity Extremities: no edema, no cyanosis, no clubbing Pulses: 2+ symmetric, upper and lower extremities, normal cap refill Neurological: alert, oriented x 3, CN2-12 intact, strength normal upper extremities and lower extremities, sensation normal throughout, DTRs 2+ throughout, no  cerebellar signs, gait normal Psychiatric: normal affect, behavior normal, pleasant   Medicare Attestation I have personally reviewed: The patient's medical and social history Their use of alcohol, tobacco or illicit drugs Their current medications and supplements The patient's functional ability including ADLs,fall risks, home safety risks, cognitive, and hearing and visual impairment Diet and physical activities Evidence for depression or mood disorders  The patient's weight, height, BMI, and visual acuity have been recorded  in the chart.  I have made referrals, counseling, and provided education to the patient based on review of the above and I have provided the patient with a written personalized care plan for preventive services.     Izora Ribas, NP   02/28/2019

## 2019-02-28 ENCOUNTER — Other Ambulatory Visit: Payer: Self-pay

## 2019-02-28 ENCOUNTER — Encounter: Payer: Self-pay | Admitting: Adult Health

## 2019-02-28 ENCOUNTER — Ambulatory Visit (INDEPENDENT_AMBULATORY_CARE_PROVIDER_SITE_OTHER): Payer: Medicare Other | Admitting: Adult Health

## 2019-02-28 VITALS — BP 106/56 | HR 75 | Temp 97.0°F | Ht 67.0 in | Wt 209.0 lb

## 2019-02-28 DIAGNOSIS — Z79899 Other long term (current) drug therapy: Secondary | ICD-10-CM

## 2019-02-28 DIAGNOSIS — G4733 Obstructive sleep apnea (adult) (pediatric): Secondary | ICD-10-CM | POA: Diagnosis not present

## 2019-02-28 DIAGNOSIS — I10 Essential (primary) hypertension: Secondary | ICD-10-CM

## 2019-02-28 DIAGNOSIS — N183 Chronic kidney disease, stage 3 unspecified: Secondary | ICD-10-CM

## 2019-02-28 DIAGNOSIS — C3492 Malignant neoplasm of unspecified part of left bronchus or lung: Secondary | ICD-10-CM | POA: Diagnosis not present

## 2019-02-28 DIAGNOSIS — R6889 Other general symptoms and signs: Secondary | ICD-10-CM

## 2019-02-28 DIAGNOSIS — Z23 Encounter for immunization: Secondary | ICD-10-CM

## 2019-02-28 DIAGNOSIS — Z0001 Encounter for general adult medical examination with abnormal findings: Secondary | ICD-10-CM

## 2019-02-28 DIAGNOSIS — I2699 Other pulmonary embolism without acute cor pulmonale: Secondary | ICD-10-CM | POA: Diagnosis not present

## 2019-02-28 DIAGNOSIS — R7309 Other abnormal glucose: Secondary | ICD-10-CM

## 2019-02-28 DIAGNOSIS — K219 Gastro-esophageal reflux disease without esophagitis: Secondary | ICD-10-CM

## 2019-02-28 DIAGNOSIS — E559 Vitamin D deficiency, unspecified: Secondary | ICD-10-CM

## 2019-02-28 DIAGNOSIS — Z0000 Encounter for general adult medical examination without abnormal findings: Secondary | ICD-10-CM

## 2019-02-28 DIAGNOSIS — I2510 Atherosclerotic heart disease of native coronary artery without angina pectoris: Secondary | ICD-10-CM | POA: Diagnosis not present

## 2019-02-28 DIAGNOSIS — M25511 Pain in right shoulder: Secondary | ICD-10-CM

## 2019-02-28 DIAGNOSIS — Z95828 Presence of other vascular implants and grafts: Secondary | ICD-10-CM

## 2019-02-28 DIAGNOSIS — K430 Incisional hernia with obstruction, without gangrene: Secondary | ICD-10-CM

## 2019-02-28 DIAGNOSIS — J310 Chronic rhinitis: Secondary | ICD-10-CM

## 2019-02-28 DIAGNOSIS — G44029 Chronic cluster headache, not intractable: Secondary | ICD-10-CM

## 2019-02-28 DIAGNOSIS — C642 Malignant neoplasm of left kidney, except renal pelvis: Secondary | ICD-10-CM | POA: Diagnosis not present

## 2019-02-28 DIAGNOSIS — N2889 Other specified disorders of kidney and ureter: Secondary | ICD-10-CM

## 2019-02-28 DIAGNOSIS — G8929 Other chronic pain: Secondary | ICD-10-CM

## 2019-02-28 DIAGNOSIS — E785 Hyperlipidemia, unspecified: Secondary | ICD-10-CM | POA: Diagnosis not present

## 2019-02-28 DIAGNOSIS — N320 Bladder-neck obstruction: Secondary | ICD-10-CM

## 2019-02-28 DIAGNOSIS — I700 Atherosclerosis of aorta: Secondary | ICD-10-CM

## 2019-02-28 DIAGNOSIS — F419 Anxiety disorder, unspecified: Secondary | ICD-10-CM

## 2019-02-28 DIAGNOSIS — J449 Chronic obstructive pulmonary disease, unspecified: Secondary | ICD-10-CM | POA: Diagnosis not present

## 2019-02-28 DIAGNOSIS — E669 Obesity, unspecified: Secondary | ICD-10-CM

## 2019-02-28 DIAGNOSIS — K279 Peptic ulcer, site unspecified, unspecified as acute or chronic, without hemorrhage or perforation: Secondary | ICD-10-CM

## 2019-02-28 MED ORDER — ROSUVASTATIN CALCIUM 5 MG PO TABS: 5.0000 mg | ORAL_TABLET | ORAL | 3 refills | Status: DC

## 2019-02-28 NOTE — Patient Instructions (Addendum)
[name] , Thank you for taking time to come for your Medicare Wellness Visit. I appreciate your ongoing commitment to your health goals. Please review the following plan we discussed and let me know if I can assist you in the future.   These are the goals we discussed: Goals    . Exercise 20-30 min daily     Can break up in small sessions a few min at a time to begin with if needed and gradually increase  Get up at least hourly to walk     . LDL CALC < 70    . Weight (lb) < 180 lb (81.6 kg)       This is a list of the screening recommended for you and due dates:  Health Maintenance  Topic Date Due  . Flu Shot  04/12/2019*  . Colon Cancer Screening  04/11/2021  . Tetanus Vaccine  12/08/2024  .  Hepatitis C: One time screening is recommended by Center for Disease Control  (CDC) for  adults born from [birth year] through 1967.   Completed  . Pneumonia vaccines  Completed  *Topic was postponed. The date shown is not the original due date.     Please have Dr. Katy Fitch forward your eye exam report   Please follow up with Dr. Earlie Server regarding xarelto or transitioning back to clopidogrel - may want to continue with xarelto even with lung clot resolved to prevent future recurrence    Please monitor your blood pressure, as we get older our body can not respond to a low blood pressure as well as it did when we were younger, for this reason we want a bit higher of a blood pressure as you get older to avoid dizziness and fatigue which can lead to falls. [place] call if your blood pressure is consistently above 150/90.   Please monitor your blood pressure. If it is getting below 130/80 AND you are having fatigue with exertion, dizziness we may need to cut your blood pressure medication in half. Please call the office if this is happening.  Hypotension As your heart beats, it forces blood through your body. This force is called blood pressure. If you have hypotension, you have low blood pressure.  When your blood pressure is too low, you may not get enough blood to your brain. You may feel weak, feel lightheaded, have a fast heartbeat, or even pass out (faint). HOME CARE  Drink enough fluids to keep your pee (urine) clear or pale yellow.  Take all medicines as told by your doctor.  Get up slowly after sitting or lying down.  Wear support stockings as told by your doctor.  Maintain a healthy diet by including foods such as fruits, vegetables, nuts, whole grains, and lean meats. GET HELP IF:  You are throwing up (vomiting) or have watery poop (diarrhea).  You have a fever for more than 2-3 days.  You feel more thirsty than usual.  You feel weak and tired. GET HELP RIGHT AWAY IF:   You pass out (faint).  You have chest pain or a fast or irregular heartbeat.  You lose feeling in part of your body.  You cannot move your arms or legs.  You have trouble speaking.  You get sweaty or feel lightheaded. MAKE SURE YOU:   Understand these instructions.  Will watch your condition.  Will get help right away if you are not doing well or get worse. Document Released: 08/24/2009 Document Revised: 01/30/2013 Document Reviewed: 11/30/2012 ExitCare  Patient Information 2015 [employer]. This information is not intended to replace advice given to you by your health care provider. Make sure you discuss any questions you have with your health care provider.

## 2019-03-01 ENCOUNTER — Other Ambulatory Visit: Payer: Self-pay | Admitting: Internal Medicine

## 2019-03-01 ENCOUNTER — Telehealth: Payer: Self-pay | Admitting: Pharmacist

## 2019-03-01 LAB — LIPID PANEL
Cholesterol: 150 mg/dL (ref <200)
HDL: 39 mg/dL — ABNORMAL LOW (ref >=40)
LDL Cholesterol (Calc): 85 mg/dL (calc)
Non-HDL Cholesterol (Calc): 111 mg/dL (calc) (ref <130)
Total CHOL/HDL Ratio: 3.8 (calc) (ref <5.0)
Triglycerides: 166 mg/dL — ABNORMAL HIGH (ref <150)

## 2019-03-01 LAB — MAGNESIUM: Magnesium: 1.7 mg/dL (ref 1.5–2.5)

## 2019-03-01 NOTE — Telephone Encounter (Signed)
Called patient to review lipid labs with him. Liane Comber was nice enough to forward on to me. Patient tried taking rosuvastatin daily, but did not tolerate. He states he has been taking rosuvastatin off and on. When asked to clarify he states he states he has been taking once a week. LDL is much improved from 3 months ago. TG are also much improved. Patient is taking Vascepa 2g BID and has been working to cut out carbs. Discussed the TG lowering/cardiovascualr benefit of Vascepa and the new evidence that it helps with plaque regression.  Patient is agreeable to increasing rosuvastatin to 2 times a week. He had a lab appointment on 9/29, but I will cancel this as he already had them done a few days ago. He was under the assumption he had an appointment with Dr. Meda Coffee, but I do not see one scheduled.  Advised that I would be sending a message to Dr. Meda Coffee to also review labs and follow up on appointment.   Dr. Meda Coffee did you want to see the patient?

## 2019-03-03 ENCOUNTER — Other Ambulatory Visit: Payer: Self-pay | Admitting: Internal Medicine

## 2019-03-03 DIAGNOSIS — I10 Essential (primary) hypertension: Secondary | ICD-10-CM

## 2019-03-03 MED ORDER — BISOPROLOL-HYDROCHLOROTHIAZIDE 5-6.25 MG PO TABS: ORAL_TABLET | ORAL | 3 refills | Status: DC

## 2019-03-06 NOTE — Telephone Encounter (Signed)
Called and spoke w/pt regarding ivy's instructions pt voiced compliance.

## 2019-03-06 NOTE — Telephone Encounter (Signed)
Pt is due to see Dr. Meda Coffee in January 2021. He will get his recall letter and contacted in December 2020 to arrange that, for that schedule isn't open yet. He can call me back in late November and I should be able to schedule him for the new year then.

## 2019-03-11 ENCOUNTER — Other Ambulatory Visit: Payer: Self-pay | Admitting: *Deleted

## 2019-03-11 ENCOUNTER — Other Ambulatory Visit: Payer: Medicare Other

## 2019-03-11 DIAGNOSIS — I10 Essential (primary) hypertension: Secondary | ICD-10-CM

## 2019-03-11 MED ORDER — BISOPROLOL-HYDROCHLOROTHIAZIDE 5-6.25 MG PO TABS: ORAL_TABLET | ORAL | 3 refills | Status: DC

## 2019-03-12 ENCOUNTER — Other Ambulatory Visit: Payer: Medicare Other

## 2019-03-14 ENCOUNTER — Other Ambulatory Visit: Payer: Self-pay

## 2019-03-14 ENCOUNTER — Telehealth: Payer: Self-pay | Admitting: Internal Medicine

## 2019-03-14 ENCOUNTER — Inpatient Hospital Stay: Payer: Medicare Other

## 2019-03-14 ENCOUNTER — Inpatient Hospital Stay: Payer: Medicare Other | Attending: Internal Medicine

## 2019-03-14 ENCOUNTER — Inpatient Hospital Stay (HOSPITAL_BASED_OUTPATIENT_CLINIC_OR_DEPARTMENT_OTHER): Payer: Medicare Other | Admitting: Internal Medicine

## 2019-03-14 ENCOUNTER — Encounter: Payer: Self-pay | Admitting: Internal Medicine

## 2019-03-14 VITALS — BP 114/69 | HR 69 | Temp 98.2°F | Resp 18 | Ht 67.0 in | Wt 209.5 lb

## 2019-03-14 DIAGNOSIS — Z5112 Encounter for antineoplastic immunotherapy: Secondary | ICD-10-CM | POA: Insufficient documentation

## 2019-03-14 DIAGNOSIS — C7951 Secondary malignant neoplasm of bone: Secondary | ICD-10-CM | POA: Diagnosis not present

## 2019-03-14 DIAGNOSIS — C3492 Malignant neoplasm of unspecified part of left bronchus or lung: Secondary | ICD-10-CM

## 2019-03-14 DIAGNOSIS — I10 Essential (primary) hypertension: Secondary | ICD-10-CM | POA: Diagnosis not present

## 2019-03-14 DIAGNOSIS — I25810 Atherosclerosis of coronary artery bypass graft(s) without angina pectoris: Secondary | ICD-10-CM

## 2019-03-14 DIAGNOSIS — Z5111 Encounter for antineoplastic chemotherapy: Secondary | ICD-10-CM | POA: Diagnosis not present

## 2019-03-14 DIAGNOSIS — C3412 Malignant neoplasm of upper lobe, left bronchus or lung: Secondary | ICD-10-CM | POA: Insufficient documentation

## 2019-03-14 DIAGNOSIS — Z95828 Presence of other vascular implants and grafts: Secondary | ICD-10-CM

## 2019-03-14 DIAGNOSIS — Z79899 Other long term (current) drug therapy: Secondary | ICD-10-CM | POA: Diagnosis not present

## 2019-03-14 LAB — CMP (CANCER CENTER ONLY)
ALT: 28 U/L (ref 0–44)
AST: 35 U/L (ref 15–41)
Albumin: 3.6 g/dL (ref 3.5–5.0)
Alkaline Phosphatase: 75 U/L (ref 38–126)
Anion gap: 10 (ref 5–15)
BUN: 22 mg/dL (ref 8–23)
CO2: 25 mmol/L (ref 22–32)
Calcium: 9.2 mg/dL (ref 8.9–10.3)
Chloride: 100 mmol/L (ref 98–111)
Creatinine: 1.24 mg/dL (ref 0.61–1.24)
GFR, Est AFR Am: >60 mL/min (ref >60)
GFR, Estimated: 57 mL/min — ABNORMAL LOW (ref >60)
Glucose, Bld: 116 mg/dL — ABNORMAL HIGH (ref 70–99)
Potassium: 3.9 mmol/L (ref 3.5–5.1)
Sodium: 135 mmol/L (ref 135–145)
Total Bilirubin: 0.5 mg/dL (ref 0.3–1.2)
Total Protein: 7.2 g/dL (ref 6.5–8.1)

## 2019-03-14 LAB — CBC WITH DIFFERENTIAL (CANCER CENTER ONLY)
Abs Immature Granulocytes: 0.02 10*3/uL (ref 0.00–0.07)
Basophils Absolute: 0 10*3/uL (ref 0.0–0.1)
Basophils Relative: 1 %
Eosinophils Absolute: 0.1 10*3/uL (ref 0.0–0.5)
Eosinophils Relative: 3 %
HCT: 35.1 % — ABNORMAL LOW (ref 39.0–52.0)
Hemoglobin: 11.8 g/dL — ABNORMAL LOW (ref 13.0–17.0)
Immature Granulocytes: 1 %
Lymphocytes Relative: 36 %
Lymphs Abs: 1.3 10*3/uL (ref 0.7–4.0)
MCH: 34.3 pg — ABNORMAL HIGH (ref 26.0–34.0)
MCHC: 33.6 g/dL (ref 30.0–36.0)
MCV: 102 fL — ABNORMAL HIGH (ref 80.0–100.0)
Monocytes Absolute: 0.5 10*3/uL (ref 0.1–1.0)
Monocytes Relative: 13 %
Neutro Abs: 1.7 10*3/uL (ref 1.7–7.7)
Neutrophils Relative %: 46 %
Platelet Count: 141 10*3/uL — ABNORMAL LOW (ref 150–400)
RBC: 3.44 MIL/uL — ABNORMAL LOW (ref 4.22–5.81)
RDW: 14.6 % (ref 11.5–15.5)
WBC Count: 3.6 10*3/uL — ABNORMAL LOW (ref 4.0–10.5)
nRBC: 0 % (ref 0.0–0.2)

## 2019-03-14 MED ORDER — HEPARIN SOD (PORK) LOCK FLUSH 100 UNIT/ML IV SOLN
500.0000 [IU] | Freq: Once | INTRAVENOUS | Status: AC | PRN
  Administered 2019-03-14: 500 [IU]
  Filled 2019-03-14: qty 5

## 2019-03-14 MED ORDER — SODIUM CHLORIDE 0.9 % IV SOLN
Freq: Once | INTRAVENOUS | Status: AC
  Administered 2019-03-14: 10:00:00 via INTRAVENOUS
  Filled 2019-03-14: qty 250

## 2019-03-14 MED ORDER — PROCHLORPERAZINE MALEATE 10 MG PO TABS
10.0000 mg | ORAL_TABLET | Freq: Once | ORAL | Status: AC
  Administered 2019-03-14: 10 mg via ORAL

## 2019-03-14 MED ORDER — SODIUM CHLORIDE 0.9 % IV SOLN
480.0000 mg/m2 | Freq: Once | INTRAVENOUS | Status: AC
  Administered 2019-03-14: 1000 mg via INTRAVENOUS
  Filled 2019-03-14: qty 40

## 2019-03-14 MED ORDER — SODIUM CHLORIDE 0.9% FLUSH
10.0000 mL | INTRAVENOUS | Status: DC | PRN
  Administered 2019-03-14: 10 mL
  Filled 2019-03-14: qty 10

## 2019-03-14 MED ORDER — SODIUM CHLORIDE 0.9% FLUSH
10.0000 mL | Freq: Once | INTRAVENOUS | Status: DC
  Filled 2019-03-14: qty 10

## 2019-03-14 MED ORDER — SODIUM CHLORIDE 0.9 % IV SOLN
200.0000 mg | Freq: Once | INTRAVENOUS | Status: AC
  Administered 2019-03-14: 200 mg via INTRAVENOUS
  Filled 2019-03-14: qty 8

## 2019-03-14 MED ORDER — PROCHLORPERAZINE MALEATE 10 MG PO TABS
ORAL_TABLET | ORAL | Status: AC
  Filled 2019-03-14: qty 1

## 2019-03-14 NOTE — Patient Instructions (Signed)
[facility] Discharge Instructions for Patients Receiving Chemotherapy  Today you received the following chemotherapy agents: Keytruda, Alimta   To help prevent nausea and vomiting after your treatment, we encourage you to take your nausea medication as directed.    If you develop nausea and vomiting that is not controlled by your nausea medication, call the clinic.   BELOW ARE SYMPTOMS THAT SHOULD BE REPORTED IMMEDIATELY:  *FEVER GREATER THAN 100.5 F  *CHILLS WITH OR WITHOUT FEVER  NAUSEA AND VOMITING THAT IS NOT CONTROLLED WITH YOUR NAUSEA MEDICATION  *UNUSUAL SHORTNESS OF BREATH  *UNUSUAL BRUISING OR BLEEDING  TENDERNESS IN MOUTH AND THROAT WITH OR WITHOUT PRESENCE OF ULCERS  *URINARY PROBLEMS  *BOWEL PROBLEMS  UNUSUAL RASH Items with * indicate a potential emergency and should be followed up as soon as possible.  Feel free to call the clinic should you have any questions or concerns. The clinic phone number is [phone].  Please show the [facility] at check-in to the Emergency Department and triage nurse.

## 2019-03-14 NOTE — Telephone Encounter (Signed)
Scheduled appt per 10/1 los - pt to get an updated schedule next visit.

## 2019-03-14 NOTE — Progress Notes (Signed)
[facility] [phone]   [phone]  OFFICE PROGRESS NOTE  [name], MD [address]  [place] [address]  DIAGNOSIS:  1) Stage IV (T1c, N0, M1 C) non-small cell lung cancer, adenocarcinoma presented with left upper lobe lung nodule in addition to metastatic disease to the bone diagnosed in April 2020. 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   3) incidental finding of pulmonary embolism involving the right middle lobe pulmonary artery on CT scan on 12/18/2018.  Molecular studies by [facility]  STK11Splice Site SNV 1.0% Everolimus,Temsirolimus  [id number] 0.8% None (VUS), No (VUS)  NTRK3 L629L 1.5%  (Synonymous) No (Synonymous)  KRAS G12D 1.1% Binimetinib  CDKN2A H83Y 1.6% Abemaciclib, Palbociclib, Ribociclib  PDL1 TPS was 0%  PRIOR THERAPY: None  CURRENT THERAPY:  1) Systemic chemotherapy with carboplatin for AUC of 5, Alimta 500 mg/M2 and Keytruda 200 mg IV every 3 weeks.  First dose Oct 16, 2018.  Status post 7 cycles.  Starting from cycle #5 the patient will be on maintenance treatment with Alimta and Keytruda. 2) Xarelto initially 15 mg p.o. twice daily for 3 weeks followed by 20 mg p.o. daily.  He started the first dose of this treatment on 12/20/2018.  INTERVAL HISTORY: Darrell BrandenburgSouthern 73y.o. male returns to the clinic today for follow-up visit.  The patient is feeling much better today except for mild fatigue.  His cough has significantly improved.  He denied having any chest pain, shortness of breath except with exertion with no hemoptysis.  He denied having any fever or chills.  He has no nausea, vomiting, diarrhea or constipation.  He denied having any headache or visual changes.  He is here today for evaluation before starting cycle #8 of his treatment.  MEDICAL HISTORY: Past Medical History:  Diagnosis Date    Anginal pain ([facility]    Anxiety    treated /w Xanax for mild depression , using 2 times per day, not PRN   Asthma    Benign prostatic hypertrophy    CAD (coronary artery disease)    pt last left heart cath was in Nov 2008. EF was 55% on left ventriculogram. Circumflex was totally occluded after the 1st obtuse marginal with collaterals supplying the distal circumflex. LAD showed luminal irregularities. The stent in LAD was patent. The RCA showed luminal irregularities. The stent in th emid RCA and PDA were patent. There were no inverventions.    Chest pain    from anxiety last time 1 month ago   Chromophobe renal cell carcinoma ([facility] 02/2018   s/p left kidney wedge resection   Chronic obstructive pulmonary disease (HCC)    asthma   Cluster headache    relative to sinus problems none recnt   Constipation    Depression    DM2 (diabetes mellitus, type 2) ([place]    well with diet and exercise controlled   GERD (gastroesophageal reflux disease)    hiatal hernia. Patient did have a Nissen fundoplication rare   Heart murmur    heard 30 yrs ago   History of blood transfusion    need for Bld. transfusion relative to taking NSAIDS   HTN (hypertension)    pt. followed by  Cardiac, last cardiac visit 2012, one yr. ago   Hyperlipidemia    Joint pain    Lactose intolerance    Left kidney mass  Low back pain    chronic   Metastatic non-small cell lung cancer ([place])    adenocarcinoma, LUL, bone metastasis in 09/2018   Neuromuscular disorder ([place])    nerve involvement in back & upper back relative to hardware in neck    Obesity    OSA and COPD overlap syndrome ([place]) 02/25/2015   no cpap used pt denies sleep apnea   Osteoarthritis    Peptic ulcer disease    Pneumonia not recent per pt   Skin cancer    basal cell- face & head, 1 melanoma revoved from left side of face   Sleep concern    states the study (2003 )was done at [facility]., it failed & he was  told to return & he never did. Pt. states he has been found by prev. hosp. staff that he has to be told when to breathe & his wife does the same.    Spinal fracture    hx of traumatic in Jun 04, 2007 after falling off the roof   Tinnitus, bilateral    Vitamin D deficiency     ALLERGIES:  is allergic to codeine; meloxicam; morphine and related; nsaids; oxycodone; crestor [rosuvastatin]; cymbalta [duloxetine hcl]; dilaudid [hydromorphone hcl]; effexor [venlafaxine]; gluten meal; topamax [topiramate]; tramadol hcl; trazodone and nefazodone; adhesive [tape]; penicillins; and sulfa antibiotics.  MEDICATIONS:  Current Outpatient Medications  Medication Sig Dispense Refill   acetaminophen (TYLENOL) 500 MG tablet Take 1 tablet (500 mg total) by mouth every 6 (six) hours as needed. 30 tablet 0   albuterol (VENTOLIN HFA) 108 (90 Base) MCG/ACT inhaler Inhale 2 puffs into the lungs every 4 (four) hours as needed for wheezing or shortness of breath. 18 g 1   Alpha-Lipoic Acid 200 MG CAPS Take by mouth 2 (two) times daily.     Ascorbic Acid (VITAMIN C) 1000 MG tablet Take 1,000 mg by mouth 2 (two) times daily.     bisoprolol-hydrochlorothiazide (ZIAC) 5-6.25 MG tablet Take 1 tablet Daily for BP 90 tablet 3   Blood Glucose Monitoring Suppl (FREESTYLE FREEDOM LITE) w/Device KIT Check blood sugar 1 time a day. [id number] 1 each 0   budesonide-formoterol (SYMBICORT) 160-4.5 MCG/ACT inhaler Use twice daily, wash your mouth afterwards to avoid yeast. 1 Inhaler 12   Cholecalciferol (VITAMIN D3) 5000 units CAPS Take 5,000 Units by mouth 2 (two) times daily.     CHOLINE PO Take 600 mg by mouth daily.     CINNAMON PO Take by mouth 2 (two) times daily.     diazepam (VALIUM) 5 MG tablet Take 1/2 to 1 tablet 2 to 3 x /day as needed for Anxiety 270 tablet 0   DOXYLAMINE SUCCINATE PO Take 0.5 tablets by mouth at bedtime.      folic acid (FOLVITE) 1 MG tablet Take 1 tablet (1 mg total) by mouth daily. 30  tablet 4   Icosapent Ethyl (VASCEPA) 1 g CAPS Take 2 capsules (2 g total) by mouth 2 (two) times a day. 120 capsule 11   ipratropium (ATROVENT) 0.03 % nasal spray Place 1-2 sprays into both nostrils daily as needed (for allergies.). 30 mL 3   lidocaine-prilocaine (EMLA) cream Apply 1 application topically as needed. 30 g 0   magnesium oxide (MAG-OX) 400 MG tablet Take 1,200 mg by mouth as needed.     MELATONIN PO Take 30 mg by mouth at bedtime.     Multiple Vitamins-Minerals (MULTIVITAMIN ADULT PO) Take by mouth daily.     nitroGLYCERIN (NITROSTAT)  0.4 MG SL tablet Dissolve 1 tablet under tongue every 3 to 5 minutes if needed for Chest Pain 50 tablet 3   OVER THE COUNTER MEDICATION Taking OTC Iron 1 tablet daily.     prochlorperazine (COMPAZINE) 10 MG tablet Take 1 tablet (10 mg total) by mouth every 6 (six) hours as needed for nausea or vomiting. 30 tablet 0   rivaroxaban (XARELTO) 20 MG TABS tablet Take 1 tablet (20 mg total) by mouth daily with supper. 30 tablet 2   rosuvastatin (CRESTOR) 5 MG tablet Take 1 tablet (5 mg total) by mouth 2 (two) times a week. Take one tablet by mouth once a week for a few weeks, increase to twice weekly as tolerated 24 tablet 3   SUPER B COMPLEX/C PO Take by mouth daily.     tamsulosin (FLOMAX) 0.4 MG CAPS capsule Take 1 capsule Daily for for Prostate 90 capsule 3   tetrahydrozoline-zinc (VISINE-AC) 0.05-0.25 % ophthalmic solution Place 1-2 drops into both eyes 3 (three) times daily as needed (for redness/irritation.).     triamcinolone (NASACORT) 55 MCG/ACT AERO nasal inhaler Place 2 sprays into the nose at bedtime. 1 Inhaler 3   triamcinolone ointment (KENALOG) 0.1 % Apply 1 application topically 2 (two) times daily. 80 g 1   Turmeric 500 MG TABS Take by mouth. Takes 3 in the morning and 3 in the evening     VALERIAN PO Take 400 mg by mouth daily.     Zinc 50 MG TABS Take by mouth daily.     No current facility-administered medications for  this visit.     SURGICAL HISTORY:  Past Surgical History:  Procedure Laterality Date   BACK SURGERY     x3- last surgery lumbar- 1998- / fusion    blepheroplasty     both eyesx2   C5-T6 posterior fusion     with [place] and radius screws   [place], 2008 & 2012 multiple stents  total 4 times   cataracts     cataracts removed- /w IOL - both eyes    CERVICAL SPINE SURGERY     COLONOSCOPY     ESOPHAGEAL MANOMETRY     HARDWARE REMOVAL  02/20/2012   Procedure: HARDWARE REMOVAL;  Surgeon: [name], MD;  Location: [facility];  Service: Neurosurgery;  Laterality: N/A;  Hardware Removal   HIATAL HERNIA REPAIR  1979, spring 2009   IR IMAGING GUIDED PORT INSERTION  10/29/2018   LAPAROSCOPIC CHOLECYSTECTOMY     & IOC   multiple percutaneous coronary interventions     NASAL SINUS SURGERY     x2, sees Dr. [name], still having problems, states he uses Benadryl PRN- up to 5 times per day    right temporal artery biopsy     right wrist plate insertion  83/4196   ROBOTIC ASSITED PARTIAL NEPHRECTOMY Left 03/02/2018   Procedure: XI ROBOTIC ASSITED LEFT PARTIAL NEPHRECTOMY WITH LYSIS OF ADHESIONS X30 MINUTES.;  Surgeon: Ardis Hughs, MD;  Location: [facility];  Service: Urology;  Laterality: Left;  CLAMP TIME FOR KIDNEY 1053-1110 TOTAL 18MINUTES   Rotator cuff surgery Right    SKIN BIOPSY Right 10/31/2017   Chest   TRANSURETHRAL RESECTION OF BLADDER TUMOR N/A 11/03/2017   Procedure: cystoscopy;  Surgeon: Kathie Rhodes, MD;  Location: [facility];  Service: Urology;  Laterality: N/A;   UMBILICAL HERNIA REPAIR     UPPER GI ENDOSCOPY     VENTRAL HERNIA REPAIR N/A  03/02/2018   Procedure: LAPAROSCOPIC REPAIR OF INCARCERATED INCISIONAL HERNIA WITH LYSIS OF ADHESIONS;  Surgeon: Michael Boston, MD;  Location: [facility];  Service: General;  Laterality: N/A;    REVIEW OF SYSTEMS:  A comprehensive review of systems was negative except for: Constitutional: positive for  fatigue Respiratory: positive for dyspnea on exertion   PHYSICAL EXAMINATION: General appearance: alert, cooperative, fatigued and no distress Head: Normocephalic, without obvious abnormality, atraumatic Neck: no adenopathy, no JVD, supple, symmetrical, trachea midline and thyroid not enlarged, symmetric, no tenderness/mass/nodules Lymph nodes: Cervical, supraclavicular, and axillary nodes normal. Resp: clear to auscultation bilaterally Back: symmetric, no curvature. ROM normal. No CVA tenderness. Cardio: regular rate and rhythm, S1, S2 normal, no murmur, click, rub or gallop GI: soft, non-tender; bowel sounds normal; no masses,  no organomegaly Extremities: extremities normal, atraumatic, no cyanosis or edema  ECOG PERFORMANCE STATUS: 1 - Symptomatic but completely ambulatory  Blood pressure 114/69, pulse 69, temperature 98.2 F (36.8 C), temperature source Temporal, resp. rate 18, height 5' 7"  (1.702 m), weight 209 lb 8 oz (95 kg), SpO2 98 %.  LABORATORY DATA: Lab Results  Component Value Date   WBC 3.6 (L) 03/14/2019   HGB 11.8 (L) 03/14/2019   HCT 35.1 (L) 03/14/2019   MCV 102.0 (H) 03/14/2019   PLT 141 (L) 03/14/2019      Chemistry      Component Value Date/Time   NA 135 03/14/2019 0845   K 3.9 03/14/2019 0845   CL 100 03/14/2019 0845   CO2 25 03/14/2019 0845   BUN 22 03/14/2019 0845   CREATININE 1.24 03/14/2019 0845   CREATININE 1.10 11/28/2018 0959      Component Value Date/Time   CALCIUM 9.2 03/14/2019 0845   ALKPHOS 75 03/14/2019 0845   AST 35 03/14/2019 0845   ALT 28 03/14/2019 0845   BILITOT 0.5 03/14/2019 0845       RADIOGRAPHIC STUDIES: Ct Chest W Contrast  Result Date: 02/19/2019 CLINICAL DATA:  Restaging metastatic left upper lobe lung cancer EXAM: CT CHEST, ABDOMEN, AND PELVIS WITH CONTRAST TECHNIQUE: Multidetector CT imaging of the chest, abdomen and pelvis was performed following the standard protocol during bolus administration of intravenous  contrast. CONTRAST:  192m OMNIPAQUE IOHEXOL 300 MG/ML SOLN, additional oral enteric contrast COMPARISON:  12/18/2018, 08/29/2018 FINDINGS: CT CHEST FINDINGS Cardiovascular: Right chest port catheter. Aortic atherosclerosis. Normal heart size. Three-vessel coronary artery calcifications and/or stents. No pericardial effusion. Previously described right middle lobe pulmonary embolism is not appreciated on current examination. Mediastinum/Nodes: No enlarged mediastinal, hilar, or axillary lymph nodes. Thyroid gland, trachea, and esophagus demonstrate no significant findings. Lungs/Pleura: Interval decrease in size of a spiculated nodule of the left upper lobe, now measuring 1.4 cm, previously 1.8 cm (series 4, image 39). Bandlike scarring in the dependent bilateral lung bases. No pleural effusion or pneumothorax. Musculoskeletal: There is a new or increased sclerotic lesion of the left glenoid (series 2, image 8). Possible subtle sclerotic lesion of the anterior right seventh rib (series 2, image 45). Partially imaged posterior cervicothoracic fusion of approximately C7 through T6. Nonacute fracture deformity of the sternal body. CT ABDOMEN PELVIS FINDINGS Hepatobiliary: No focal liver abnormality is seen. Status post cholecystectomy. No biliary dilatation. Pancreas: Unremarkable. No pancreatic ductal dilatation or surrounding inflammatory changes. Spleen: Normal in size without significant abnormality. Adrenals/Urinary Tract: Adrenal glands are unremarkable. Unchanged postoperative stranding about the superior pole of the left kidney. Nonobstructive inferior pole calculus of the left kidney. Bladder is unremarkable. Stomach/Bowel: Surgical clips about the  gastric fundus and diaphragmatic hiatus. No evidence of bowel wall thickening, distention, or inflammatory changes. Sigmoid diverticulosis without evidence of acute diverticulitis. Vascular/Lymphatic: Aortic atherosclerosis. No enlarged abdominal or pelvic lymph  nodes. Reproductive: No mass or other abnormality. Other: No abdominal wall hernia or abnormality. No abdominopelvic ascites. Musculoskeletal: There multiple mixed lytic and sclerotic osseous lesion of the pelvis, including multiple lesions of the left ilium (series 2, image 83) and a lesion of the right ilium (series 2, image 93). There is increasing sclerosis, including of the soft tissue component, of the dominant lesion of the left ilium. There is additional bone graft harvest site of the right ilium. Posterior lumbar fusion of L3 through L5. IMPRESSION: 1. Interval decrease in size of a spiculated nodule of the left upper lobe, now measuring 1.4 cm, previously 1.8 cm (series 4, image 39), consistent treatment response. 2. There is a new or increasingly sclerotic lesion of the left glenoid (series 2, image 8). Possible subtle sclerotic lesion of the anterior right seventh rib (series 2, image 45), unchanged from prior. 3. There multiple mixed lytic and sclerotic osseous lesion of the pelvis, including multiple lesions of the left ilium (series 2, image 83) and a lesion of the right ilium (series 2, image 93). There is increasing sclerosis, including of the soft tissue component, of the dominant lesion of the left ilium. 4. Above constellation of findings favors treatment response with post treatment sclerosis of underlying osseous metastatic disease. No definite new osseous metastatic disease. 5. Previously described right middle lobe pulmonary embolism is not appreciated on current examination. 6. Other chronic, incidental, and postoperative findings as detailed above. Electronically Signed   By: Eddie Candle M.D.   On: 02/19/2019 14:52   Ct Abdomen Pelvis W Contrast  Result Date: 02/19/2019 CLINICAL DATA:  Restaging metastatic left upper lobe lung cancer EXAM: CT CHEST, ABDOMEN, AND PELVIS WITH CONTRAST TECHNIQUE: Multidetector CT imaging of the chest, abdomen and pelvis was performed following the standard  protocol during bolus administration of intravenous contrast. CONTRAST:  163m OMNIPAQUE IOHEXOL 300 MG/ML SOLN, additional oral enteric contrast COMPARISON:  12/18/2018, 08/29/2018 FINDINGS: CT CHEST FINDINGS Cardiovascular: Right chest port catheter. Aortic atherosclerosis. Normal heart size. Three-vessel coronary artery calcifications and/or stents. No pericardial effusion. Previously described right middle lobe pulmonary embolism is not appreciated on current examination. Mediastinum/Nodes: No enlarged mediastinal, hilar, or axillary lymph nodes. Thyroid gland, trachea, and esophagus demonstrate no significant findings. Lungs/Pleura: Interval decrease in size of a spiculated nodule of the left upper lobe, now measuring 1.4 cm, previously 1.8 cm (series 4, image 39). Bandlike scarring in the dependent bilateral lung bases. No pleural effusion or pneumothorax. Musculoskeletal: There is a new or increased sclerotic lesion of the left glenoid (series 2, image 8). Possible subtle sclerotic lesion of the anterior right seventh rib (series 2, image 45). Partially imaged posterior cervicothoracic fusion of approximately C7 through T6. Nonacute fracture deformity of the sternal body. CT ABDOMEN PELVIS FINDINGS Hepatobiliary: No focal liver abnormality is seen. Status post cholecystectomy. No biliary dilatation. Pancreas: Unremarkable. No pancreatic ductal dilatation or surrounding inflammatory changes. Spleen: Normal in size without significant abnormality. Adrenals/Urinary Tract: Adrenal glands are unremarkable. Unchanged postoperative stranding about the superior pole of the left kidney. Nonobstructive inferior pole calculus of the left kidney. Bladder is unremarkable. Stomach/Bowel: Surgical clips about the gastric fundus and diaphragmatic hiatus. No evidence of bowel wall thickening, distention, or inflammatory changes. Sigmoid diverticulosis without evidence of acute diverticulitis. Vascular/Lymphatic: Aortic  atherosclerosis. No enlarged abdominal or  pelvic lymph nodes. Reproductive: No mass or other abnormality. Other: No abdominal wall hernia or abnormality. No abdominopelvic ascites. Musculoskeletal: There multiple mixed lytic and sclerotic osseous lesion of the pelvis, including multiple lesions of the left ilium (series 2, image 83) and a lesion of the right ilium (series 2, image 93). There is increasing sclerosis, including of the soft tissue component, of the dominant lesion of the left ilium. There is additional bone graft harvest site of the right ilium. Posterior lumbar fusion of L3 through L5. IMPRESSION: 1. Interval decrease in size of a spiculated nodule of the left upper lobe, now measuring 1.4 cm, previously 1.8 cm (series 4, image 39), consistent treatment response. 2. There is a new or increasingly sclerotic lesion of the left glenoid (series 2, image 8). Possible subtle sclerotic lesion of the anterior right seventh rib (series 2, image 45), unchanged from prior. 3. There multiple mixed lytic and sclerotic osseous lesion of the pelvis, including multiple lesions of the left ilium (series 2, image 83) and a lesion of the right ilium (series 2, image 93). There is increasing sclerosis, including of the soft tissue component, of the dominant lesion of the left ilium. 4. Above constellation of findings favors treatment response with post treatment sclerosis of underlying osseous metastatic disease. No definite new osseous metastatic disease. 5. Previously described right middle lobe pulmonary embolism is not appreciated on current examination. 6. Other chronic, incidental, and postoperative findings as detailed above. Electronically Signed   By: Eddie Candle M.D.   On: 02/19/2019 14:52    ASSESSMENT AND PLAN: This is a very pleasant 73 years old white male recently diagnosed with metastatic non-small cell lung cancer, adenocarcinoma presented with left upper lobe lung nodule in addition to metastatic  bone disease diagnosed in April 2020. The molecular studies showed no actionable mutations and PDL 1 expression was negative. The patient also has a history of renal cell carcinoma status post wedge resection of the left kidney in September 2019. The patient is currently undergoing systemic chemotherapy with carboplatin for AUC of 5, Alimta 500 mg/M2 and Keytruda 200 mg IV every 3 weeks status post 7 cycles.  Starting from cycle #5 the patient will be treated with maintenance Alimta and Keytruda every 3 weeks. The patient has been tolerating this treatment much better except for 1 week of fatigue after the cycle. I recommended for him to proceed with cycle #8 today as planned. For the incidental finding of pulmonary embolism of the right middle lobe pulmonary artery, he is currently on Xarelto 20 mg p.o. daily.  He will continue with the same treatment for now. He will come back for follow-up visit in 3 weeks for evaluation before the next cycle of his treatment. He was advised to call immediately if he has any concerning symptoms in the interval. The patient voices understanding of current disease status and treatment options and is in agreement with the current care plan. All questions were answered. The patient knows to call the clinic with any problems, questions or concerns. We can certainly see the patient much sooner if necessary.  Disclaimer: This note was dictated with voice recognition software. Similar sounding words can inadvertently be transcribed and may not be corrected upon review.

## 2019-03-18 ENCOUNTER — Telehealth: Payer: Self-pay | Admitting: *Deleted

## 2019-03-18 NOTE — Telephone Encounter (Signed)
Pt called with c/o itching face and eyes, mouth tenderness. Reviewed with MD, pt to try Benadryl for itching, hydrocortisone cream to affected areas and Warm saltwater and baking soda rinse for his mouth. Instructed pt to call back with update on how he is doing or concerns. Pt verbalized understanding.

## 2019-03-26 DIAGNOSIS — D49512 Neoplasm of unspecified behavior of left kidney: Secondary | ICD-10-CM | POA: Diagnosis not present

## 2019-03-27 ENCOUNTER — Telehealth: Payer: Self-pay | Admitting: Cardiology

## 2019-03-27 DIAGNOSIS — E119 Type 2 diabetes mellitus without complications: Secondary | ICD-10-CM | POA: Diagnosis not present

## 2019-03-27 DIAGNOSIS — H04123 Dry eye syndrome of bilateral lacrimal glands: Secondary | ICD-10-CM | POA: Diagnosis not present

## 2019-03-27 DIAGNOSIS — Z961 Presence of intraocular lens: Secondary | ICD-10-CM | POA: Diagnosis not present

## 2019-03-27 LAB — HM DIABETES EYE EXAM

## 2019-03-27 NOTE — Telephone Encounter (Signed)
New message:     Patient calling stating that he dropped his medication and would like for some one to call back.

## 2019-03-27 NOTE — Telephone Encounter (Signed)
Pt states he dropped his Vascepa prescription on the dirty floor and is very concerned about taking contaminated medication, for he states he is immunocompromised. Informed the pt that I will call his pharmacy now, and ask what we can do in this situation, being he is concerned about the contaminated pills dropped, and being this medication is also very expensive, and will be considered a early refill. Pt verbalized understanding and agrees with this plan.   Spoke with the pharmacist at his pharmacy and she tried to have this approved by his insurance, which is denying for early refill, even in this circumstance.  Per the Pharmacist, she said he can get this refilled as early as 10/22, but in this case if he wants to see if his insurance will approve for early refill under these circumstances, he will have to give them a call himself and explain the situation.  Informed the pt of this and he states he thinks he has enough clean vascepa tablets to last him until 10/22, and that's a "workable situation." Pt was more than gracious for all the assistance provided.

## 2019-04-04 ENCOUNTER — Inpatient Hospital Stay (HOSPITAL_BASED_OUTPATIENT_CLINIC_OR_DEPARTMENT_OTHER): Payer: Medicare Other | Admitting: Internal Medicine

## 2019-04-04 ENCOUNTER — Encounter: Payer: Self-pay | Admitting: Internal Medicine

## 2019-04-04 ENCOUNTER — Inpatient Hospital Stay: Payer: Medicare Other

## 2019-04-04 ENCOUNTER — Telehealth: Payer: Self-pay | Admitting: Internal Medicine

## 2019-04-04 ENCOUNTER — Other Ambulatory Visit: Payer: Self-pay

## 2019-04-04 VITALS — BP 129/74 | HR 75 | Temp 98.2°F | Resp 17 | Ht 67.0 in | Wt 209.9 lb

## 2019-04-04 DIAGNOSIS — I25810 Atherosclerosis of coronary artery bypass graft(s) without angina pectoris: Secondary | ICD-10-CM | POA: Diagnosis not present

## 2019-04-04 DIAGNOSIS — C642 Malignant neoplasm of left kidney, except renal pelvis: Secondary | ICD-10-CM | POA: Diagnosis not present

## 2019-04-04 DIAGNOSIS — C3492 Malignant neoplasm of unspecified part of left bronchus or lung: Secondary | ICD-10-CM

## 2019-04-04 DIAGNOSIS — Z5112 Encounter for antineoplastic immunotherapy: Secondary | ICD-10-CM

## 2019-04-04 DIAGNOSIS — Z5111 Encounter for antineoplastic chemotherapy: Secondary | ICD-10-CM

## 2019-04-04 DIAGNOSIS — C3490 Malignant neoplasm of unspecified part of unspecified bronchus or lung: Secondary | ICD-10-CM | POA: Diagnosis not present

## 2019-04-04 DIAGNOSIS — C3412 Malignant neoplasm of upper lobe, left bronchus or lung: Secondary | ICD-10-CM | POA: Diagnosis not present

## 2019-04-04 DIAGNOSIS — C7951 Secondary malignant neoplasm of bone: Secondary | ICD-10-CM | POA: Diagnosis not present

## 2019-04-04 DIAGNOSIS — R5382 Chronic fatigue, unspecified: Secondary | ICD-10-CM

## 2019-04-04 DIAGNOSIS — Z79899 Other long term (current) drug therapy: Secondary | ICD-10-CM | POA: Diagnosis not present

## 2019-04-04 LAB — CBC WITH DIFFERENTIAL (CANCER CENTER ONLY)
Abs Immature Granulocytes: 0.02 10*3/uL (ref 0.00–0.07)
Basophils Absolute: 0 10*3/uL (ref 0.0–0.1)
Basophils Relative: 1 %
Eosinophils Absolute: 0.1 10*3/uL (ref 0.0–0.5)
Eosinophils Relative: 3 %
HCT: 36.1 % — ABNORMAL LOW (ref 39.0–52.0)
Hemoglobin: 12.3 g/dL — ABNORMAL LOW (ref 13.0–17.0)
Immature Granulocytes: 1 %
Lymphocytes Relative: 36 %
Lymphs Abs: 1.5 10*3/uL (ref 0.7–4.0)
MCH: 34.3 pg — ABNORMAL HIGH (ref 26.0–34.0)
MCHC: 34.1 g/dL (ref 30.0–36.0)
MCV: 100.6 fL — ABNORMAL HIGH (ref 80.0–100.0)
Monocytes Absolute: 0.5 10*3/uL (ref 0.1–1.0)
Monocytes Relative: 13 %
Neutro Abs: 1.9 10*3/uL (ref 1.7–7.7)
Neutrophils Relative %: 46 %
Platelet Count: 156 10*3/uL (ref 150–400)
RBC: 3.59 MIL/uL — ABNORMAL LOW (ref 4.22–5.81)
RDW: 14.4 % (ref 11.5–15.5)
WBC Count: 4.2 10*3/uL (ref 4.0–10.5)
nRBC: 0 % (ref 0.0–0.2)

## 2019-04-04 LAB — CMP (CANCER CENTER ONLY)
ALT: 27 U/L (ref 0–44)
AST: 32 U/L (ref 15–41)
Albumin: 3.4 g/dL — ABNORMAL LOW (ref 3.5–5.0)
Alkaline Phosphatase: 84 U/L (ref 38–126)
Anion gap: 9 (ref 5–15)
BUN: 30 mg/dL — ABNORMAL HIGH (ref 8–23)
CO2: 24 mmol/L (ref 22–32)
Calcium: 9.2 mg/dL (ref 8.9–10.3)
Chloride: 100 mmol/L (ref 98–111)
Creatinine: 1.13 mg/dL (ref 0.61–1.24)
GFR, Est AFR Am: >60 mL/min (ref >60)
GFR, Estimated: >60 mL/min (ref >60)
Glucose, Bld: 92 mg/dL (ref 70–99)
Potassium: 3.7 mmol/L (ref 3.5–5.1)
Sodium: 133 mmol/L — ABNORMAL LOW (ref 135–145)
Total Bilirubin: 0.4 mg/dL (ref 0.3–1.2)
Total Protein: 7.2 g/dL (ref 6.5–8.1)

## 2019-04-04 LAB — TSH: TSH: 1.32 u[IU]/mL (ref 0.320–4.118)

## 2019-04-04 MED ORDER — SODIUM CHLORIDE 0.9 % IV SOLN
480.0000 mg/m2 | Freq: Once | INTRAVENOUS | Status: AC
  Administered 2019-04-04: 1000 mg via INTRAVENOUS
  Filled 2019-04-04: qty 40

## 2019-04-04 MED ORDER — CYANOCOBALAMIN 1000 MCG/ML IJ SOLN
INTRAMUSCULAR | Status: AC
  Filled 2019-04-04: qty 1

## 2019-04-04 MED ORDER — PROCHLORPERAZINE MALEATE 10 MG PO TABS
10.0000 mg | ORAL_TABLET | Freq: Once | ORAL | Status: AC
  Administered 2019-04-04: 10 mg via ORAL

## 2019-04-04 MED ORDER — CYANOCOBALAMIN 1000 MCG/ML IJ SOLN
1000.0000 ug | Freq: Once | INTRAMUSCULAR | Status: AC
  Administered 2019-04-04: 1000 ug via INTRAMUSCULAR

## 2019-04-04 MED ORDER — HEPARIN SOD (PORK) LOCK FLUSH 100 UNIT/ML IV SOLN
500.0000 [IU] | Freq: Once | INTRAVENOUS | Status: AC | PRN
  Administered 2019-04-04: 500 [IU]
  Filled 2019-04-04: qty 5

## 2019-04-04 MED ORDER — SODIUM CHLORIDE 0.9% FLUSH
10.0000 mL | INTRAVENOUS | Status: DC | PRN
  Administered 2019-04-04: 10 mL
  Filled 2019-04-04: qty 10

## 2019-04-04 MED ORDER — PROCHLORPERAZINE MALEATE 10 MG PO TABS
ORAL_TABLET | ORAL | Status: AC
  Filled 2019-04-04: qty 1

## 2019-04-04 MED ORDER — SODIUM CHLORIDE 0.9 % IV SOLN
Freq: Once | INTRAVENOUS | Status: AC
  Administered 2019-04-04: 10:00:00 via INTRAVENOUS
  Filled 2019-04-04: qty 250

## 2019-04-04 MED ORDER — SODIUM CHLORIDE 0.9 % IV SOLN
200.0000 mg | Freq: Once | INTRAVENOUS | Status: AC
  Administered 2019-04-04: 200 mg via INTRAVENOUS
  Filled 2019-04-04: qty 8

## 2019-04-04 NOTE — Patient Instructions (Signed)
[facility] Discharge Instructions for Patients Receiving Chemotherapy  Today you received the following chemotherapy agents Keytruda; Alimta  To help prevent nausea and vomiting after your treatment, we encourage you to take your nausea medication as directed   If you develop nausea and vomiting that is not controlled by your nausea medication, call the clinic.   BELOW ARE SYMPTOMS THAT SHOULD BE REPORTED IMMEDIATELY:  *FEVER GREATER THAN 100.5 F  *CHILLS WITH OR WITHOUT FEVER  NAUSEA AND VOMITING THAT IS NOT CONTROLLED WITH YOUR NAUSEA MEDICATION  *UNUSUAL SHORTNESS OF BREATH  *UNUSUAL BRUISING OR BLEEDING  TENDERNESS IN MOUTH AND THROAT WITH OR WITHOUT PRESENCE OF ULCERS  *URINARY PROBLEMS  *BOWEL PROBLEMS  UNUSUAL RASH Items with * indicate a potential emergency and should be followed up as soon as possible.  Feel free to call the clinic should you have any questions or concerns. The clinic phone number is [phone].  Please show the CHEMO ALERT CARD at check-in to the Emergency Department and triage nurse.   

## 2019-04-04 NOTE — Telephone Encounter (Signed)
Per 10/22 los appt already scheduled.

## 2019-04-04 NOTE — Progress Notes (Signed)
[place] [phone]   [phone]  OFFICE PROGRESS NOTE  [name], MD [address] [place] [address]  DIAGNOSIS:  1) Stage IV (T1c, N0, M1 C) non-small cell lung cancer, adenocarcinoma presented with left upper lobe lung nodule in addition to metastatic disease to the bone diagnosed in April 2020. 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   3) incidental finding of pulmonary embolism involving the right middle lobe pulmonary artery on CT scan on 12/18/2018.  Molecular studies by [employer] [address]  STK11Splice Site SNV 9.1% Everolimus,Temsirolimus  [id number] 0.8% None (VUS), No (VUS)  NTRK3 L629L 1.5%  (Synonymous) No (Synonymous)  KRAS G12D 1.1% Binimetinib  CDKN2A H83Y 1.6% Abemaciclib, Palbociclib, Ribociclib  PDL1 TPS was 0%  PRIOR THERAPY: None  CURRENT THERAPY:  1) Systemic chemotherapy with carboplatin for AUC of 5, Alimta 500 mg/M2 and Keytruda 200 mg IV every 3 weeks.  First dose Oct 16, 2018.  Status post 7 cycles.  Starting from cycle #5 the patient will be on maintenance treatment with Alimta and Keytruda. 2) Xarelto initially 15 mg p.o. twice daily for 3 weeks followed by 20 mg p.o. daily.  He started the first dose of this treatment on 12/20/2018.  INTERVAL HISTORY: [name] 73y.o. male returns to the clinic today for follow-up visit.  The patient is feeling fine today with no concerning complaints except for fatigue.  He denied having any current chest pain but has shortness of breath with exertion with no cough or hemoptysis.  He denied having any fever or chills.  He has no nausea, vomiting, diarrhea or constipation.  He has no headache or visual changes.  He is here today for evaluation before starting cycle #9.  MEDICAL HISTORY: Past Medical History:  Diagnosis Date  . Anginal pain ([place]   . Anxiety    treated /w Xanax for mild depression , using 2 times per day, not PRN  . Asthma   . Benign prostatic hypertrophy   . CAD (coronary artery disease)    pt last left heart cath was in Nov 2008. EF was 55% on left ventriculogram. Circumflex was totally occluded after the 1st obtuse marginal with collaterals supplying the distal circumflex. LAD showed luminal irregularities. The stent in LAD was patent. The RCA showed luminal irregularities. The stent in th emid RCA and PDA were patent. There were no inverventions.   . Chest pain    from anxiety last time 1 month ago  . Chromophobe renal cell carcinoma ([place] 02/2018   s/p left kidney wedge resection  . Chronic obstructive pulmonary disease (HCC)    asthma  . Cluster headache    relative to sinus problems none recnt  . Constipation   . Depression   . DM2 (diabetes mellitus, type 2) ([place]    well with diet and exercise controlled  . GERD (gastroesophageal reflux disease)    hiatal hernia. Patient did have a Nissen fundoplication rare  . Heart murmur    heard 30 yrs ago  . History of blood transfusion    need for Bld. transfusion relative to taking NSAIDS  . HTN (hypertension)    pt. followed by [facility], last cardiac visit 2012, one yr. ago  . Hyperlipidemia   . Joint pain   . Lactose intolerance   . Left kidney mass   . Low back pain  chronic  . Metastatic non-small cell lung cancer (HCC)    adenocarcinoma, LUL, bone metastasis in 09/2018  . Neuromuscular disorder ([place])    nerve involvement in back & upper back relative to hardware in neck   . Obesity   . OSA and COPD overlap syndrome ([place]) 02/25/2015   no cpap used pt denies sleep apnea  . Osteoarthritis   . Peptic ulcer disease   . Pneumonia not recent per pt  . 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 Sleep concern    states the study (2003 )was done at [facility]., it failed & he was told to return & he never did. Pt.  states he has been found by prev. hosp. staff that he has to be told when to breathe & his wife does the same.   Marland Kitchen Spinal fracture    hx of traumatic in Jun 04, 2007 after falling off the roof  . Tinnitus, bilateral   . Vitamin D deficiency     ALLERGIES:  is allergic to codeine; meloxicam; morphine and related; nsaids; oxycodone; crestor [rosuvastatin]; cymbalta [duloxetine hcl]; dilaudid [hydromorphone hcl]; effexor [venlafaxine]; gluten meal; topamax [topiramate]; tramadol hcl; trazodone and nefazodone; adhesive [tape]; penicillins; and sulfa antibiotics.  MEDICATIONS:  Current Outpatient Medications  Medication Sig Dispense Refill  . acetaminophen (TYLENOL) 500 MG tablet Take 1 tablet (500 mg total) by mouth every 6 (six) hours as needed. 30 tablet 0  . albuterol (VENTOLIN HFA) 108 (90 Base) MCG/ACT inhaler Inhale 2 puffs into the lungs every 4 (four) hours as needed for wheezing or shortness of breath. 18 g 1  . Alpha-Lipoic Acid 200 MG CAPS Take by mouth 2 (two) times daily.    . Ascorbic Acid (VITAMIN C) 1000 MG tablet Take 1,000 mg by mouth 2 (two) times daily.    . bisoprolol-hydrochlorothiazide (ZIAC) 5-6.25 MG tablet Take 1 tablet Daily for BP 90 tablet 3  . Blood Glucose Monitoring Suppl (FREESTYLE FREEDOM LITE) w/Device KIT Check blood sugar 1 time a day. [id number] 1 each 0  . budesonide-formoterol (SYMBICORT) 160-4.5 MCG/ACT inhaler Use twice daily, wash your mouth afterwards to avoid yeast. 1 Inhaler 12  . Cholecalciferol (VITAMIN D3) 5000 units CAPS Take 5,000 Units by mouth 2 (two) times daily.    . CHOLINE PO Take 600 mg by mouth daily.    Marland Kitchen CINNAMON PO Take by mouth 2 (two) times daily.    . diazepam (VALIUM) 5 MG tablet Take 1/2 to 1 tablet 2 to 3 x /day as needed for Anxiety 270 tablet 0  . DOXYLAMINE SUCCINATE PO Take 0.5 tablets by mouth at bedtime.     . folic acid (FOLVITE) 1 MG tablet Take 1 tablet (1 mg total) by mouth daily. 30 tablet 4  . Icosapent Ethyl  (VASCEPA) 1 g CAPS Take 2 capsules (2 g total) by mouth 2 (two) times a day. 120 capsule 11  . ipratropium (ATROVENT) 0.03 % nasal spray Place 1-2 sprays into both nostrils daily as needed (for allergies.). 30 mL 3  . lidocaine-prilocaine (EMLA) cream Apply 1 application topically as needed. 30 g 0  . magnesium oxide (MAG-OX) 400 MG tablet Take 1,200 mg by mouth as needed.    Marland Kitchen MELATONIN PO Take 30 mg by mouth at bedtime.    . Multiple Vitamins-Minerals (MULTIVITAMIN ADULT PO) Take by mouth daily.    . nitroGLYCERIN (NITROSTAT) 0.4 MG SL tablet Dissolve 1  tablet under tongue every 3 to 5 minutes if needed for Chest Pain 50 tablet 3  . OVER THE COUNTER MEDICATION Taking OTC Iron 1 tablet daily.    . prochlorperazine (COMPAZINE) 10 MG tablet Take 1 tablet (10 mg total) by mouth every 6 (six) hours as needed for nausea or vomiting. 30 tablet 0  . rivaroxaban (XARELTO) 20 MG TABS tablet Take 1 tablet (20 mg total) by mouth daily with supper. 30 tablet 2  . rosuvastatin (CRESTOR) 5 MG tablet Take 1 tablet (5 mg total) by mouth 2 (two) times a week. Take one tablet by mouth once a week for a few weeks, increase to twice weekly as tolerated 24 tablet 3  . SUPER B COMPLEX/C PO Take by mouth daily.    . tamsulosin (FLOMAX) 0.4 MG CAPS capsule Take 1 capsule Daily for for Prostate 90 capsule 3  . tetrahydrozoline-zinc (VISINE-AC) 0.05-0.25 % ophthalmic solution Place 1-2 drops into both eyes 3 (three) times daily as needed (for redness/irritation.).    Marland Kitchen triamcinolone (NASACORT) 55 MCG/ACT AERO nasal inhaler Place 2 sprays into the nose at bedtime. 1 Inhaler 3  . triamcinolone ointment (KENALOG) 0.1 % Apply 1 application topically 2 (two) times daily. 80 g 1  . Turmeric 500 MG TABS Take by mouth. Takes 3 in the morning and 3 in the evening    . VALERIAN PO Take 400 mg by mouth daily.    . Zinc 50 MG TABS Take by mouth daily.     No current facility-administered medications for this visit.     SURGICAL  HISTORY:  Past Surgical History:  Procedure Laterality Date  . BACK SURGERY     x3- last surgery lumbar- 1998- / fusion   . blepheroplasty     both eyesx2  . C5-T6 posterior fusion     with Oasis and radius screws  . [place], 2008 & 2012 multiple stents  total 4 times  . cataracts     cataracts removed- /w IOL - both eyes   . CERVICAL SPINE SURGERY    . COLONOSCOPY    . ESOPHAGEAL MANOMETRY    . HARDWARE REMOVAL  02/20/2012   Procedure: HARDWARE REMOVAL;  Surgeon: [name], MD;  Location: [facility] ORS;  Service: Neurosurgery;  Laterality: N/A;  Hardware Removal  . HIATAL HERNIA REPAIR  1979, spring 2009  . IR IMAGING GUIDED PORT INSERTION  10/29/2018  . LAPAROSCOPIC CHOLECYSTECTOMY     & IOC  . multiple percutaneous coronary interventions    . NASAL SINUS SURGERY     x2, sees Dr. [name], still having problems, states he uses Benadryl PRN- up to 5 times per day   . right temporal artery biopsy    . right wrist plate insertion  60/4540  . ROBOTIC ASSITED PARTIAL NEPHRECTOMY Left 03/02/2018   Procedure: XI ROBOTIC ASSITED LEFT PARTIAL NEPHRECTOMY WITH LYSIS OF ADHESIONS X30 MINUTES.;  Surgeon: Ardis Hughs, MD;  Location: [facility];  Service: Urology;  Laterality: Left;  CLAMP TIME FOR KIDNEY 1053-1110 TOTAL 18MINUTES  . Rotator cuff surgery Right   . SKIN BIOPSY Right 10/31/2017   Chest  . TRANSURETHRAL RESECTION OF BLADDER TUMOR N/A 11/03/2017   Procedure: cystoscopy;  Surgeon: Kathie Rhodes, MD;  Location: [facility];  Service: Urology;  Laterality: N/A;  . UMBILICAL HERNIA REPAIR    . UPPER GI ENDOSCOPY    . VENTRAL HERNIA REPAIR N/A 03/02/2018   Procedure: LAPAROSCOPIC REPAIR  OF INCARCERATED INCISIONAL HERNIA WITH LYSIS OF ADHESIONS;  Surgeon: Michael Boston, MD;  Location: [facility]S;  Service: General;  Laterality: N/A;    REVIEW OF SYSTEMS:  A comprehensive review of systems was negative except for: Constitutional: positive for fatigue Respiratory:  positive for dyspnea on exertion   PHYSICAL EXAMINATION: General appearance: alert, cooperative, fatigued and no distress Head: Normocephalic, without obvious abnormality, atraumatic Neck: no adenopathy, no JVD, supple, symmetrical, trachea midline and thyroid not enlarged, symmetric, no tenderness/mass/nodules Lymph nodes: Cervical, supraclavicular, and axillary nodes normal. Resp: clear to auscultation bilaterally Back: symmetric, no curvature. ROM normal. No CVA tenderness. Cardio: regular rate and rhythm, S1, S2 normal, no murmur, click, rub or gallop GI: soft, non-tender; bowel sounds normal; no masses,  no organomegaly Extremities: extremities normal, atraumatic, no cyanosis or edema  ECOG PERFORMANCE STATUS: 1 - Symptomatic but completely ambulatory  Blood pressure 129/74, pulse 75, temperature 98.2 F (36.8 C), temperature source Oral, resp. rate 17, height _0  (1.702 m), weight 209 lb 14.4 oz (95.2 kg), SpO2 98 %.  LABORATORY DATA: Lab Results  Component Value Date   WBC 3.6 (L) 03/14/2019   HGB 11.8 (L) 03/14/2019   HCT 35.1 (L) 03/14/2019   MCV 102.0 (H) 03/14/2019   PLT 141 (L) 03/14/2019      Chemistry      Component Value Date/Time   NA 135 03/14/2019 0845   K 3.9 03/14/2019 0845   CL 100 03/14/2019 0845   CO2 25 03/14/2019 0845   BUN 22 03/14/2019 0845   CREATININE 1.24 03/14/2019 0845   CREATININE 1.10 11/28/2018 0959      Component Value Date/Time   CALCIUM 9.2 03/14/2019 0845   ALKPHOS 75 03/14/2019 0845   AST 35 03/14/2019 0845   ALT 28 03/14/2019 0845   BILITOT 0.5 03/14/2019 0845       RADIOGRAPHIC STUDIES: No results found.  ASSESSMENT AND PLAN: This is a very pleasant 73 years old white male recently diagnosed with metastatic non-small cell lung cancer, adenocarcinoma presented with left upper lobe lung nodule in addition to metastatic bone disease diagnosed in April 2020. The molecular studies showed no actionable mutations and PDL 1  expression was negative. The patient also has a history of renal cell carcinoma status post wedge resection of the left kidney in September 2019. The patient is currently undergoing systemic chemotherapy with carboplatin for AUC of 5, Alimta 500 mg/M2 and Keytruda 200 mg IV every 3 weeks status post 8 cycles.  Starting from cycle #5 the patient will be treated with maintenance Alimta and Keytruda every 3 weeks. The patient continues to tolerate the treatment well with no concerning complaints. I recommended for him to proceed with cycle #9 today as planned. I will see him back for follow-up visit in 3 weeks for evaluation with repeat CT scan of the chest, abdomen pelvis for restaging of his disease. For the incidental finding of pulmonary embolism of the right middle lobe pulmonary artery, he is currently on Xarelto 20 mg p.o. daily.  He will continue with the same treatment for now. He was advised to call immediately if he has any concerning symptoms in the interval. The patient voices understanding of current disease status and treatment options and is in agreement with the current care plan. All questions were answered. The patient knows to call the clinic with any problems, questions or concerns. We can certainly see the patient much sooner if necessary.  Disclaimer: This note was dictated with voice  recognition software. Similar sounding words can inadvertently be transcribed and may not be corrected upon review.

## 2019-04-09 ENCOUNTER — Other Ambulatory Visit: Payer: Self-pay | Admitting: Internal Medicine

## 2019-04-09 ENCOUNTER — Telehealth: Payer: Self-pay | Admitting: Medical Oncology

## 2019-04-09 MED ORDER — DOXYCYCLINE HYCLATE 100 MG PO TABS: 100.0000 mg | ORAL_TABLET | Freq: Two times a day (BID) | ORAL | 0 refills | Status: DC

## 2019-04-09 NOTE — Telephone Encounter (Signed)
We can give him doxycycline 100 mg po bid for 7 days if not allergic to it. I will order. Please let him know.

## 2019-04-09 NOTE — Telephone Encounter (Signed)
Cough , congestion , nonproductive cough over the weekend. Lungs are still sore from dry cough. He took zpak -3 doses he had on hand . He thinks it helped some , but he is still SOB and had to rest after walking to mailbox and had to sit on commode after showering today. He says he still feels there is something in his lungs and he "cannot break it up". Denies fever. He is taking Mucinex.  10/22Marland Kitchen Beryle Flock [place].

## 2019-04-10 NOTE — Telephone Encounter (Signed)
Pt.notified

## 2019-04-23 ENCOUNTER — Encounter (HOSPITAL_COMMUNITY): Payer: Self-pay

## 2019-04-23 ENCOUNTER — Ambulatory Visit (HOSPITAL_COMMUNITY)
Admission: RE | Admit: 2019-04-23 | Discharge: 2019-04-23 | Disposition: A | Discharge status: Home / Self Care | Payer: Medicare Other | Source: Ambulatory Visit | Attending: Internal Medicine | Admitting: Internal Medicine

## 2019-04-23 ENCOUNTER — Other Ambulatory Visit: Payer: Self-pay

## 2019-04-23 DIAGNOSIS — C3490 Malignant neoplasm of unspecified part of unspecified bronchus or lung: Secondary | ICD-10-CM | POA: Insufficient documentation

## 2019-04-23 MED ORDER — HEPARIN SOD (PORK) LOCK FLUSH 100 UNIT/ML IV SOLN
INTRAVENOUS | Status: AC
  Administered 2019-04-23: 500 [IU] via INTRAVENOUS
  Filled 2019-04-23: qty 5

## 2019-04-23 MED ORDER — IOHEXOL 300 MG/ML  SOLN
100.0000 mL | Freq: Once | INTRAMUSCULAR | Status: AC | PRN
  Administered 2019-04-23: 100 mL via INTRAVENOUS

## 2019-04-23 MED ORDER — HEPARIN SOD (PORK) LOCK FLUSH 100 UNIT/ML IV SOLN
500.0000 [IU] | Freq: Once | INTRAVENOUS | Status: AC
  Administered 2019-04-23: 10:00:00 500 [IU] via INTRAVENOUS

## 2019-04-23 MED ORDER — SODIUM CHLORIDE (PF) 0.9 % IJ SOLN
INTRAMUSCULAR | Status: AC
  Filled 2019-04-23: qty 50

## 2019-04-25 ENCOUNTER — Inpatient Hospital Stay: Payer: Medicare Other | Attending: Internal Medicine

## 2019-04-25 ENCOUNTER — Inpatient Hospital Stay: Payer: Medicare Other

## 2019-04-25 ENCOUNTER — Inpatient Hospital Stay (HOSPITAL_BASED_OUTPATIENT_CLINIC_OR_DEPARTMENT_OTHER): Payer: Medicare Other | Admitting: Physician Assistant

## 2019-04-25 ENCOUNTER — Encounter: Payer: Self-pay | Admitting: Physician Assistant

## 2019-04-25 ENCOUNTER — Other Ambulatory Visit: Payer: Self-pay

## 2019-04-25 VITALS — BP 133/80 | HR 87 | Temp 98.2°F | Resp 17 | Ht 67.0 in | Wt 211.6 lb

## 2019-04-25 DIAGNOSIS — Z5112 Encounter for antineoplastic immunotherapy: Secondary | ICD-10-CM | POA: Insufficient documentation

## 2019-04-25 DIAGNOSIS — Z5111 Encounter for antineoplastic chemotherapy: Secondary | ICD-10-CM | POA: Diagnosis not present

## 2019-04-25 DIAGNOSIS — C3492 Malignant neoplasm of unspecified part of left bronchus or lung: Secondary | ICD-10-CM

## 2019-04-25 DIAGNOSIS — R5382 Chronic fatigue, unspecified: Secondary | ICD-10-CM

## 2019-04-25 DIAGNOSIS — I25810 Atherosclerosis of coronary artery bypass graft(s) without angina pectoris: Secondary | ICD-10-CM | POA: Diagnosis not present

## 2019-04-25 DIAGNOSIS — C3412 Malignant neoplasm of upper lobe, left bronchus or lung: Secondary | ICD-10-CM | POA: Insufficient documentation

## 2019-04-25 DIAGNOSIS — C7951 Secondary malignant neoplasm of bone: Secondary | ICD-10-CM | POA: Insufficient documentation

## 2019-04-25 DIAGNOSIS — Z79899 Other long term (current) drug therapy: Secondary | ICD-10-CM | POA: Diagnosis not present

## 2019-04-25 DIAGNOSIS — Z95828 Presence of other vascular implants and grafts: Secondary | ICD-10-CM

## 2019-04-25 LAB — CBC WITH DIFFERENTIAL (CANCER CENTER ONLY)
Abs Immature Granulocytes: 0.01 10*3/uL (ref 0.00–0.07)
Basophils Absolute: 0 10*3/uL (ref 0.0–0.1)
Basophils Relative: 1 %
Eosinophils Absolute: 0.1 10*3/uL (ref 0.0–0.5)
Eosinophils Relative: 3 %
HCT: 36.9 % — ABNORMAL LOW (ref 39.0–52.0)
Hemoglobin: 12.6 g/dL — ABNORMAL LOW (ref 13.0–17.0)
Immature Granulocytes: 0 %
Lymphocytes Relative: 33 %
Lymphs Abs: 1.3 10*3/uL (ref 0.7–4.0)
MCH: 34.2 pg — ABNORMAL HIGH (ref 26.0–34.0)
MCHC: 34.1 g/dL (ref 30.0–36.0)
MCV: 100.3 fL — ABNORMAL HIGH (ref 80.0–100.0)
Monocytes Absolute: 0.5 10*3/uL (ref 0.1–1.0)
Monocytes Relative: 13 %
Neutro Abs: 2 10*3/uL (ref 1.7–7.7)
Neutrophils Relative %: 50 %
Platelet Count: 165 10*3/uL (ref 150–400)
RBC: 3.68 MIL/uL — ABNORMAL LOW (ref 4.22–5.81)
RDW: 14.5 % (ref 11.5–15.5)
WBC Count: 3.9 10*3/uL — ABNORMAL LOW (ref 4.0–10.5)
nRBC: 0 % (ref 0.0–0.2)

## 2019-04-25 LAB — CMP (CANCER CENTER ONLY)
ALT: 29 U/L (ref 0–44)
AST: 36 U/L (ref 15–41)
Albumin: 3.5 g/dL (ref 3.5–5.0)
Alkaline Phosphatase: 83 U/L (ref 38–126)
Anion gap: 10 (ref 5–15)
BUN: 22 mg/dL (ref 8–23)
CO2: 25 mmol/L (ref 22–32)
Calcium: 9 mg/dL (ref 8.9–10.3)
Chloride: 99 mmol/L (ref 98–111)
Creatinine: 1.18 mg/dL (ref 0.61–1.24)
GFR, Est AFR Am: >60 mL/min (ref >60)
GFR, Estimated: >60 mL/min (ref >60)
Glucose, Bld: 104 mg/dL — ABNORMAL HIGH (ref 70–99)
Potassium: 4.2 mmol/L (ref 3.5–5.1)
Sodium: 134 mmol/L — ABNORMAL LOW (ref 135–145)
Total Bilirubin: 0.4 mg/dL (ref 0.3–1.2)
Total Protein: 7.4 g/dL (ref 6.5–8.1)

## 2019-04-25 LAB — TSH: TSH: 1.415 u[IU]/mL (ref 0.320–4.118)

## 2019-04-25 MED ORDER — SODIUM CHLORIDE 0.9% FLUSH
10.0000 mL | INTRAVENOUS | Status: DC | PRN
  Administered 2019-04-25: 10 mL
  Filled 2019-04-25: qty 10

## 2019-04-25 MED ORDER — SODIUM CHLORIDE 0.9 % IV SOLN
Freq: Once | INTRAVENOUS | Status: AC
  Administered 2019-04-25: 13:00:00 via INTRAVENOUS
  Filled 2019-04-25: qty 250

## 2019-04-25 MED ORDER — PROCHLORPERAZINE MALEATE 10 MG PO TABS
ORAL_TABLET | ORAL | Status: AC
  Filled 2019-04-25: qty 1

## 2019-04-25 MED ORDER — PROCHLORPERAZINE MALEATE 10 MG PO TABS
10.0000 mg | ORAL_TABLET | Freq: Once | ORAL | Status: AC
  Administered 2019-04-25: 10 mg via ORAL

## 2019-04-25 MED ORDER — SODIUM CHLORIDE 0.9 % IV SOLN
200.0000 mg | Freq: Once | INTRAVENOUS | Status: AC
  Administered 2019-04-25: 200 mg via INTRAVENOUS
  Filled 2019-04-25: qty 8

## 2019-04-25 MED ORDER — SODIUM CHLORIDE 0.9% FLUSH
10.0000 mL | Freq: Once | INTRAVENOUS | Status: DC
  Filled 2019-04-25: qty 10

## 2019-04-25 MED ORDER — SODIUM CHLORIDE 0.9 % IV SOLN
480.0000 mg/m2 | Freq: Once | INTRAVENOUS | Status: AC
  Administered 2019-04-25: 1000 mg via INTRAVENOUS
  Filled 2019-04-25: qty 40

## 2019-04-25 MED ORDER — HEPARIN SOD (PORK) LOCK FLUSH 100 UNIT/ML IV SOLN
500.0000 [IU] | Freq: Once | INTRAVENOUS | Status: AC | PRN
  Administered 2019-04-25: 500 [IU]
  Filled 2019-04-25: qty 5

## 2019-04-25 NOTE — Patient Instructions (Signed)
[facility] [place] Discharge Instructions for Patients Receiving Chemotherapy  Today you received the following chemotherapy agents: Pembrolizumab (Keytruda) and Pemetrexed (Alimta)  To help prevent nausea and vomiting after your treatment, we encourage you to take your nausea medication as directed.   If you develop nausea and vomiting that is not controlled by your nausea medication, call the clinic.   BELOW ARE SYMPTOMS THAT SHOULD BE REPORTED IMMEDIATELY:  *FEVER GREATER THAN 100.5 F  *CHILLS WITH OR WITHOUT FEVER  NAUSEA AND VOMITING THAT IS NOT CONTROLLED WITH YOUR NAUSEA MEDICATION  *UNUSUAL SHORTNESS OF BREATH  *UNUSUAL BRUISING OR BLEEDING  TENDERNESS IN MOUTH AND THROAT WITH OR WITHOUT PRESENCE OF ULCERS  *URINARY PROBLEMS  *BOWEL PROBLEMS  UNUSUAL RASH Items with * indicate a potential emergency and should be followed up as soon as possible.  Feel free to call the clinic should you have any questions or concerns. The clinic phone number is [phone].  Please show the [place] at check-in to the Emergency Department and triage nurse.  Coronavirus (COVID-19) Are you at risk?  Are you at risk for the Coronavirus (COVID-19)?  To be considered HIGH RISK for Coronavirus (COVID-19), you have to meet the following criteria:  . Traveled to [place], [place], [place], [place] or [place]; or in the [place] to [place], [place], [place], or [place]; and have fever, cough, and shortness of breath within the last 2 weeks of travel OR . Been in close contact with a person diagnosed with COVID-19 within the last 2 weeks and have fever, cough, and shortness of breath . IF YOU DO NOT MEET THESE CRITERIA, YOU ARE CONSIDERED LOW RISK FOR COVID-19.  What to do if you are HIGH RISK for COVID-19?  Marland Kitchen If you are having a medical emergency, call 911. . Seek medical care right away. Before you go to a doctor's office, urgent care or emergency department,  call ahead and tell them about your recent travel, contact with someone diagnosed with COVID-19, and your symptoms. You should receive instructions from your physician's office regarding next steps of care.  . When you arrive at healthcare provider, tell the healthcare staff immediately you have returned from visiting [place], [place], [place], [place] or [place]; or traveled in the [place] to [place], [place], [place], or [place]; in the last two weeks or you have been in close contact with a person diagnosed with COVID-19 in the last 2 weeks.   . Tell the health care staff about your symptoms: fever, cough and shortness of breath. . After you have been seen by a medical provider, you will be either: o Tested for (COVID-19) and discharged home on quarantine except to seek medical care if symptoms worsen, and asked to  - Stay home and avoid contact with others until you get your results (4-5 days)  - Avoid travel on public transportation if possible (such as bus, train, or airplane) or o Sent to the Emergency Department by EMS for evaluation, COVID-19 testing, and possible admission depending on your condition and test results.  What to do if you are LOW RISK for COVID-19?  Reduce your risk of any infection by using the same precautions used for avoiding the common cold or flu:  Marland Kitchen Wash your hands often with soap and warm water for at least 20 seconds.  If soap and water are not readily available, use an alcohol-based hand sanitizer with at least 60% alcohol.  Marland Kitchen  If coughing or sneezing, cover your mouth and nose by coughing or sneezing into the elbow areas of your shirt or coat, into a tissue or into your sleeve (not your hands). . Avoid shaking hands with others and consider head nods or verbal greetings only. . Avoid touching your eyes, nose, or mouth with unwashed hands.  . Avoid close contact with people who are sick. . Avoid places or events with large numbers of people in one  location, like concerts or sporting events. . Carefully consider travel plans you have or are making. . If you are planning any travel outside or inside the [name], visit the CDC's Travelers' Health webpage for the latest health notices. . If you have some symptoms but not all symptoms, continue to monitor at home and seek medical attention if your symptoms worsen. . If you are having a medical emergency, call 911.   [place] / e-Visit: [email or web address]         [facility]: [facility]: [phone]                   [facility]: [phone]

## 2019-04-25 NOTE — Progress Notes (Signed)
[facility] OFFICE PROGRESS NOTE  [name], [address]  [place] [address]  DIAGNOSIS:  1)Stage IV (T1c, N0, M1 C) non-small cell lung cancer, adenocarcinoma presented with left upper lobe lung nodule in addition to metastatic disease to the bone diagnosed in April 2020. 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  Molecular studies by [facility]  STK11Splice Site SNV 4.7% Everolimus,Temsirolimus  [id number] 0.8% None (VUS),No (VUS)  NTRK3 L629L 1.5% (Synonymous)No (Synonymous)  KRAS G12D 1.1% Binimetinib  CDKN2A H83Y 1.6% Abemaciclib, Palbociclib, Ribociclib  PRIOR THERAPY: None  CURRENT THERAPY: Systemic chemotherapy with carboplatin for AUC of 5, Alimta 500 mg/M2 and Keytruda 200 mg IV every 3 weeks. First dose Oct 16, 2018.Status post 9 cycles of treatment. Starting from cycle #5 the patient has been on maintenance Alimta and Keytruda.   INTERVAL HISTORY: Darrell GammelSouthern 73y.o. male returns to the clinic for a follow up visit. The patient is feeling well today without any concerning complaints except for some fatigue 2 weeks following chemotherapy. He also notes a mild persistent sinus headaches between his eyebrows. He is planning to follow with his PCP regarding this issue. He has seen ENT several times in the past for this concern.  Regarding his treatment, the patient continues to tolerate treatment with Keytruda and Alimta well without any adverse effects besides the fatigue. Denies any fever, chills, night sweats, or weight loss. Denies any chest pain or hemoptysis. He reports his baseline shortness of breath and a mild cough secondary to his post nasal drainage. Denies any nausea, vomiting, diarrhea, or constipation.  Denies any rashes or skin changes. He recently had a restaging CT scan performed. The patient is here today  for evaluation and to review his scan results prior to starting cycle # 10  MEDICAL HISTORY: Past Medical History:  Diagnosis Date  . Anginal pain ([place]   . Anxiety    treated /w Xanax for mild depression , using 2 times per day, not PRN  . Asthma   . Benign prostatic hypertrophy   . CAD (coronary artery disease)    pt last left heart cath was in Nov 2008. EF was 55% on left ventriculogram. Circumflex was totally occluded after the 1st obtuse marginal with collaterals supplying the distal circumflex. LAD showed luminal irregularities. The stent in LAD was patent. The RCA showed luminal irregularities. The stent in th emid RCA and PDA were patent. There were no inverventions.   . Chest pain    from anxiety last time 1 month ago  . Chromophobe renal cell carcinoma ([place] 02[place]   s/p left kidney wedge resection  . Chronic obstructive pulmonary disease (HCC)    asthma  . Cluster headache    relative to sinus problems none recnt  . Constipation   . Depression   . DM2 (diabetes mellitus, type 2) ([place]    well with diet and exercise controlled  . GERD (gastroesophageal reflux disease)    hiatal hernia. Patient did have a Nissen fundoplication rare  . Heart murmur    heard 30 yrs ago  . History of blood transfusion    need for Bld. transfusion relative to taking NSAIDS  . HTN (hypertension)    pt. followed by [facility], last cardiac visit 2012, one yr. ago  . Hyperlipidemia   . Joint pain   . Lactose intolerance   . Left kidney mass   .  Low back pain    chronic  . Metastatic non-small cell lung cancer (HCC)    adenocarcinoma, LUL, bone metastasis in 09/2018  . Neuromuscular disorder ([place])    nerve involvement in back & upper back relative to hardware in neck   . Obesity   . OSA and COPD overlap syndrome ([place]) 02/25/2015   no cpap used pt denies sleep apnea  . Osteoarthritis   . Peptic ulcer disease   . Pneumonia not recent per pt  . Skin cancer    basal cell- face &  head, 1 melanoma revoved from left side of face  . Sleep concern    states the study (2003 )was done at [facility] it failed & he was told to return & he never did. Pt. states he has been found by prev. hosp. staff that he has to be told when to breathe & his wife does the same.   Marland Kitchen Spinal fracture    hx of traumatic in Jun 04, 2007 after falling off the roof  . Tinnitus, bilateral   . Vitamin D deficiency     ALLERGIES:  is allergic to codeine; meloxicam; morphine and related; nsaids; oxycodone; crestor [rosuvastatin]; cymbalta [duloxetine hcl]; dilaudid [hydromorphone hcl]; effexor [venlafaxine]; gluten meal; topamax [topiramate]; tramadol hcl; trazodone and nefazodone; adhesive [tape]; penicillins; and sulfa antibiotics.  MEDICATIONS:  Current Outpatient Medications  Medication Sig Dispense Refill  . acetaminophen (TYLENOL) 500 MG tablet Take 1 tablet (500 mg total) by mouth every 6 (six) hours as needed. 30 tablet 0  . albuterol (VENTOLIN HFA) 108 (90 Base) MCG/ACT inhaler Inhale 2 puffs into the lungs every 4 (four) hours as needed for wheezing or shortness of breath. 18 g 1  . Alpha-Lipoic Acid 200 MG CAPS Take by mouth 2 (two) times daily.    . Ascorbic Acid (VITAMIN C) 1000 MG tablet Take 1,000 mg by mouth 2 (two) times daily.    . bisoprolol-hydrochlorothiazide (ZIAC) 5-6.25 MG tablet Take 1 tablet Daily for BP 90 tablet 3  . Blood Glucose Monitoring Suppl (FREESTYLE FREEDOM LITE) w/Device KIT Check blood sugar 1 time a day. [id number] 1 each 0  . budesonide-formoterol (SYMBICORT) 160-4.5 MCG/ACT inhaler Use twice daily, wash your mouth afterwards to avoid yeast. 1 Inhaler 12  . Cholecalciferol (VITAMIN D3) 5000 units CAPS Take 5,000 Units by mouth 2 (two) times daily.    . CHOLINE PO Take 600 mg by mouth daily.    Marland Kitchen CINNAMON PO Take by mouth 2 (two) times daily.    . diazepam (VALIUM) 5 MG tablet Take 1/2 to 1 tablet 2 to 3 x /day as needed for Anxiety 270 tablet 0  .  doxycycline (VIBRA-TABS) 100 MG tablet Take 1 tablet (100 mg total) by mouth 2 (two) times daily. 14 tablet 0  . DOXYLAMINE SUCCINATE PO Take 0.5 tablets by mouth at bedtime.     . folic acid (FOLVITE) 1 MG tablet Take 1 tablet (1 mg total) by mouth daily. 30 tablet 4  . Icosapent Ethyl (VASCEPA) 1 g CAPS Take 2 capsules (2 g total) by mouth 2 (two) times a day. 120 capsule 11  . ipratropium (ATROVENT) 0.03 % nasal spray Place 1-2 sprays into both nostrils daily as needed (for allergies.). 30 mL 3  . lidocaine-prilocaine (EMLA) cream Apply 1 application topically as needed. 30 g 0  . magnesium oxide (MAG-OX) 400 MG tablet Take 1,200 mg by mouth as needed.    Marland Kitchen MELATONIN PO Take 30 mg by  mouth at bedtime.    . Multiple Vitamins-Minerals (MULTIVITAMIN ADULT PO) Take by mouth daily.    . nitroGLYCERIN (NITROSTAT) 0.4 MG SL tablet Dissolve 1 tablet under tongue every 3 to 5 minutes if needed for Chest Pain 50 tablet 3  . OVER THE COUNTER MEDICATION Taking OTC Iron 1 tablet daily.    . prochlorperazine (COMPAZINE) 10 MG tablet Take 1 tablet (10 mg total) by mouth every 6 (six) hours as needed for nausea or vomiting. 30 tablet 0  . rivaroxaban (XARELTO) 20 MG TABS tablet Take 1 tablet (20 mg total) by mouth daily with supper. 30 tablet 2  . rosuvastatin (CRESTOR) 5 MG tablet Take 1 tablet (5 mg total) by mouth 2 (two) times a week. Take one tablet by mouth once a week for a few weeks, increase to twice weekly as tolerated 24 tablet 3  . SUPER B COMPLEX/C PO Take by mouth daily.    . tamsulosin (FLOMAX) 0.4 MG CAPS capsule Take 1 capsule Daily for for Prostate 90 capsule 3  . tetrahydrozoline-zinc (VISINE-AC) 0.05-0.25 % ophthalmic solution Place 1-2 drops into both eyes 3 (three) times daily as needed (for redness/irritation.).    Marland Kitchen triamcinolone (NASACORT) 55 MCG/ACT AERO nasal inhaler Place 2 sprays into the nose at bedtime. 1 Inhaler 3  . Triamcinolone Acetonide (NASAL ALLERGY 24 HOUR NA) SMARTSIG:2  Spray(s) Both Nares Every Night    . triamcinolone ointment (KENALOG) 0.1 % Apply 1 application topically 2 (two) times daily. 80 g 1  . Turmeric 500 MG TABS Take by mouth. Takes 3 in the morning and 3 in the evening    . VALERIAN PO Take 400 mg by mouth daily.    . Zinc 50 MG TABS Take by mouth daily.     No current facility-administered medications for this visit.    Facility-Administered Medications Ordered in Other Visits  Medication Dose Route Frequency Provider Last Rate Last Dose  . 0.9 %  sodium chloride infusion   Intravenous Once Curt Bears, MD      . heparin lock flush 100 unit/mL  500 Units Intracatheter Once PRN Curt Bears, MD      . pembrolizumab [facility]) 200 mg in sodium chloride 0.9 % 50 mL chemo infusion  200 mg Intravenous Once Curt Bears, MD      . PEMEtrexed (ALIMTA) 1,050 mg in sodium chloride 0.9 % 100 mL chemo infusion  500 mg/m2 (Treatment Plan Recorded) Intravenous Once Curt Bears, MD      . prochlorperazine (COMPAZINE) tablet 10 mg  10 mg Oral Once Curt Bears, MD      . sodium chloride flush (NS) 0.9 % injection 10 mL  10 mL Intracatheter PRN Curt Bears, MD        SURGICAL HISTORY:  Past Surgical History:  Procedure Laterality Date  . BACK SURGERY     x3- last surgery lumbar- 1998- / fusion   . blepheroplasty     both eyesx2  . C5-T6 posterior fusion     with [place] and radius screws  . [place], 2008 & 2012 multiple stents  total 4 times  . cataracts     cataracts removed- /w IOL - both eyes   . CERVICAL SPINE SURGERY    . COLONOSCOPY    . ESOPHAGEAL MANOMETRY    . HARDWARE REMOVAL  02/20/2012   Procedure: HARDWARE REMOVAL;  Surgeon: [name], MD;  Location: [facility] NEURO ORS;  Service: Neurosurgery;  Laterality:  N/A;  Hardware Removal  . [place], spring 2009  . IR IMAGING GUIDED PORT INSERTION  10/29/2018  . LAPAROSCOPIC CHOLECYSTECTOMY     & IOC  . multiple percutaneous  coronary interventions    . NASAL SINUS SURGERY     x2, sees Dr. [name], still having problems, states he uses Benadryl PRN- up to 5 times per day   . right temporal artery biopsy    . right wrist plate insertion  [id number]/5093  . ROBOTIC ASSITED PARTIAL NEPHRECTOMY Left 03/02/2018   Procedure: XI ROBOTIC ASSITED LEFT PARTIAL NEPHRECTOMY WITH LYSIS OF ADHESIONS X30 MINUTES.;  Surgeon: Ardis Hughs, MD;  Location: [facility] ORS;  Service: Urology;  Laterality: Left;  CLAMP TIME FOR KIDNEY 1053-1110 TOTAL 18MINUTES  . Rotator cuff surgery Right   . SKIN BIOPSY Right 10/31/2017   Chest  . TRANSURETHRAL RESECTION OF BLADDER TUMOR N/A 11/03/2017   Procedure: cystoscopy;  Surgeon: Kathie Rhodes, MD;  Location: [facility] ORS;  Service: Urology;  Laterality: N/A;  . UMBILICAL HERNIA REPAIR    . UPPER GI ENDOSCOPY    . VENTRAL HERNIA REPAIR N/A 03/02/2018   Procedure: LAPAROSCOPIC REPAIR OF INCARCERATED INCISIONAL HERNIA WITH LYSIS OF ADHESIONS;  Surgeon: Michael Boston, MD;  Location: [facility] ORS;  Service: General;  Laterality: N/A;    REVIEW OF SYSTEMS:   Review of Systems  Constitutional: Positive for fatigue 2 weeks following chemotherapy. Negative for appetite change, chills, fatigue, fever and unexpected weight change.  HENT: Negative for mouth sores, nosebleeds, sore throat and trouble swallowing.   Eyes: Negative for eye problems and icterus.  Respiratory: Positive for baseline shortness of breath and cough. Negative for  hemoptysis and wheezing.   Cardiovascular: Negative for chest pain and leg swelling.  Gastrointestinal: Negative for abdominal pain, constipation, diarrhea, nausea and vomiting.  Genitourinary: Negative for bladder incontinence, difficulty urinating, dysuria, frequency and hematuria.   Musculoskeletal: Negative for back pain, gait problem, neck pain and neck stiffness.  Skin: Negative for itching and rash.  Neurological: Positive for mild sinus headaches. Negative for dizziness, extremity  weakness, gait problem, light-headedness and seizures.  Hematological: Negative for adenopathy. Does not bruise/bleed easily.  Psychiatric/Behavioral: Negative for confusion, depression and sleep disturbance. The patient is not nervous/anxious.     PHYSICAL EXAMINATION:  Blood pressure 133/80, pulse 87, temperature 98.2 F (36.8 C), temperature source Temporal, resp. rate 17, height 5' 7"  (1.702 m), weight 211 lb 9.6 oz (96 kg), SpO2 100 %.  ECOG PERFORMANCE STATUS: 1 - Symptomatic but completely ambulatory  Physical Exam  Constitutional: Oriented to person, place, and time and well-developed, well-nourished, and in no distress.  HENT:  Head: Normocephalic and atraumatic.  Mouth/Throat: Oropharynx is clear and moist. No oropharyngeal exudate.  Eyes: Conjunctivae are normal. Right eye exhibits no discharge. Left eye exhibits no discharge. No scleral icterus.  Neck: Normal range of motion. Neck supple.  Cardiovascular: Normal rate, regular rhythm, normal heart sounds and intact distal pulses.   Pulmonary/Chest: Effort normal and breath sounds normal. No respiratory distress. No wheezes. No rales.  Abdominal: Soft. Bowel sounds are normal. Exhibits no distension and no mass. There is no tenderness.  Musculoskeletal: Normal range of motion. Exhibits no edema.  Lymphadenopathy:    No cervical adenopathy.  Neurological: Alert and oriented to person, place, and time. Exhibits normal muscle tone. Gait normal. Coordination normal.  Skin: Skin is warm and dry. No rash noted. Not diaphoretic. No erythema. No pallor.  Psychiatric: Mood, memory and judgment  normal.  Vitals reviewed.  LABORATORY DATA: Lab Results  Component Value Date   WBC 3.9 (L) 04/25/2019   HGB 12.6 (L) 04/25/2019   HCT 36.9 (L) 04/25/2019   MCV 100.3 (H) 04/25/2019   PLT 165 04/25/2019      Chemistry      Component Value Date/Time   NA 134 (L) 04/25/2019 1057   K 4.2 04/25/2019 1057   CL 99 04/25/2019 1057   CO2  25 04/25/2019 1057   BUN 22 04/25/2019 1057   CREATININE 1.18 04/25/2019 1057   CREATININE 1.10 11/28/2018 0959      Component Value Date/Time   CALCIUM 9.0 04/25/2019 1057   ALKPHOS 83 04/25/2019 1057   AST 36 04/25/2019 1057   ALT 29 04/25/2019 1057   BILITOT 0.4 04/25/2019 1057       RADIOGRAPHIC STUDIES:  Ct Chest W Contrast  Result Date: 04/23/2019 CLINICAL DATA:  Lung cancer restaging EXAM: CT CHEST, ABDOMEN, AND PELVIS WITH CONTRAST TECHNIQUE: Multidetector CT imaging of the chest, abdomen and pelvis was performed following the standard protocol during bolus administration of intravenous contrast. CONTRAST:  175m OMNIPAQUE IOHEXOL 300 MG/ML SOLN, additional oral enteric contrast COMPARISON:  02/19/2019 FINDINGS: CT CHEST FINDINGS Cardiovascular: Right chest port catheter. Aortic atherosclerosis. Normal heart size. Three-vessel coronary artery calcifications and/or stents. No pericardial effusion. Mediastinum/Nodes: No enlarged mediastinal, hilar, or axillary lymph nodes. Thyroid gland, trachea, and esophagus demonstrate no significant findings. Lungs/Pleura: Unchanged spiculated nodule of the left upper lobe measuring 1.4 x 1.2 cm (series 11, image 42). Bibasilar scarring. No pleural effusion or pneumothorax. Musculoskeletal: Unchanged sclerotic lesion of the left glenoid, which may be a metastatic lesion or degenerative subchondral cyst (series 7, image 13). Unchanged chronic fracture deformity of the sternal body. High-grade wedge deformities of the upper thoracic spine and focal kyphosis with bridging posterior cervicothoracic fusion. CT ABDOMEN PELVIS FINDINGS Hepatobiliary: No focal liver abnormality is seen. Status post cholecystectomy. No biliary dilatation. Pancreas: Unremarkable. No pancreatic ductal dilatation or surrounding inflammatory changes. Spleen: Normal in size without significant abnormality. Adrenals/Urinary Tract: Adrenal glands are unremarkable. Nonobstructive  inferior pole calculus of the left kidney. Unchanged postoperative stranding about the superior pole of the left kidney. Bladder is unremarkable. Stomach/Bowel: Hiatal hernia repair. Stomach is within normal limits. Appendix appears normal. No evidence of bowel wall thickening, distention, or inflammatory changes. Sigmoid diverticulosis. Vascular/Lymphatic: Severe aortic atherosclerosis. No enlarged abdominal or pelvic lymph nodes. Reproductive: Prostatomegaly. Other: No abdominal wall hernia or abnormality. No abdominopelvic ascites. Musculoskeletal: Unchanged, densely sclerotic osseous lesions of the pelvis, including a dominant lesion with a calcified soft tissue component of the left ilium (series 7, image 143). Bone harvest site of the posterior right ilium. Posterior lumbar fusion. IMPRESSION: 1. Unchanged spiculated nodule of the left upper lobe measuring 1.4 x 1.2 cm (series 11, image 42), consistent with treated malignancy. 2. Unchanged sclerotic lesion of the left glenoid, which may be a metastatic lesion or degenerative subchondral cyst (series 7, image 13). Unchanged, densely sclerotic osseous lesions of the pelvis, including a dominant lesion with a calcified soft tissue component of the left ilium (series 7, image 143). Findings are consistent with stable osseous metastatic disease. 3. Other chronic, incidental, and postoperative findings as detailed above. Aortic Atherosclerosis (ICD10-I70.0). Electronically Signed   By: AEddie CandleM.D.   On: 04/23/2019 11:13   Ct Abdomen Pelvis W Contrast  Result Date: 04/23/2019 CLINICAL DATA:  Lung cancer restaging EXAM: CT CHEST, ABDOMEN, AND PELVIS WITH CONTRAST TECHNIQUE: Multidetector CT  imaging of the chest, abdomen and pelvis was performed following the standard protocol during bolus administration of intravenous contrast. CONTRAST:  134m OMNIPAQUE IOHEXOL 300 MG/ML SOLN, additional oral enteric contrast COMPARISON:  02/19/2019 FINDINGS: CT CHEST  FINDINGS Cardiovascular: Right chest port catheter. Aortic atherosclerosis. Normal heart size. Three-vessel coronary artery calcifications and/or stents. No pericardial effusion. Mediastinum/Nodes: No enlarged mediastinal, hilar, or axillary lymph nodes. Thyroid gland, trachea, and esophagus demonstrate no significant findings. Lungs/Pleura: Unchanged spiculated nodule of the left upper lobe measuring 1.4 x 1.2 cm (series 11, image 42). Bibasilar scarring. No pleural effusion or pneumothorax. Musculoskeletal: Unchanged sclerotic lesion of the left glenoid, which may be a metastatic lesion or degenerative subchondral cyst (series 7, image 13). Unchanged chronic fracture deformity of the sternal body. High-grade wedge deformities of the upper thoracic spine and focal kyphosis with bridging posterior cervicothoracic fusion. CT ABDOMEN PELVIS FINDINGS Hepatobiliary: No focal liver abnormality is seen. Status post cholecystectomy. No biliary dilatation. Pancreas: Unremarkable. No pancreatic ductal dilatation or surrounding inflammatory changes. Spleen: Normal in size without significant abnormality. Adrenals/Urinary Tract: Adrenal glands are unremarkable. Nonobstructive inferior pole calculus of the left kidney. Unchanged postoperative stranding about the superior pole of the left kidney. Bladder is unremarkable. Stomach/Bowel: Hiatal hernia repair. Stomach is within normal limits. Appendix appears normal. No evidence of bowel wall thickening, distention, or inflammatory changes. Sigmoid diverticulosis. Vascular/Lymphatic: Severe aortic atherosclerosis. No enlarged abdominal or pelvic lymph nodes. Reproductive: Prostatomegaly. Other: No abdominal wall hernia or abnormality. No abdominopelvic ascites. Musculoskeletal: Unchanged, densely sclerotic osseous lesions of the pelvis, including a dominant lesion with a calcified soft tissue component of the left ilium (series 7, image 143). Bone harvest site of the posterior  right ilium. Posterior lumbar fusion. IMPRESSION: 1. Unchanged spiculated nodule of the left upper lobe measuring 1.4 x 1.2 cm (series 11, image 42), consistent with treated malignancy. 2. Unchanged sclerotic lesion of the left glenoid, which may be a metastatic lesion or degenerative subchondral cyst (series 7, image 13). Unchanged, densely sclerotic osseous lesions of the pelvis, including a dominant lesion with a calcified soft tissue component of the left ilium (series 7, image 143). Findings are consistent with stable osseous metastatic disease. 3. Other chronic, incidental, and postoperative findings as detailed above. Aortic Atherosclerosis (ICD10-I70.0). Electronically Signed   By: AEddie CandleM.D.   On: 04/23/2019 11:13     ASSESSMENT/PLAN:  This is a very pleasant 72year old Caucasian male diagnosed with stage IV non-small cell lung cancer, adenocarcinoma.  He presented with a left upper lobe lung nodule in addition to metastatic bone disease.  He was diagnosed in April 2020.  His PDL 1 expression is negative and he has no actionable mutations.  He also has a history of renal cell carcinoma.  He is status post wedge resection to the left kidney which was performed in September 2019.  The patient is currently undergoing palliative systemic chemotherapy with carboplatin for an AUC of 5, Alimta 500 mg/m, and Keytruda 200 mg IV every 3 weeks. The patient is status post 9 cycles of treatment. Starting from cycle #5, the patient has been on maintenance Alimta and Keytruda. He tolerated his treatment well without any adverse effects except for some mild fatigue.  The patient recently had a restaging CT scan performed. Dr. MJulien Nordmannpersonally and indenpendnly reviewed the scan and discussed the results with the patient and his wife today. The scan did not show any evidence for disease progression. Dr. MJulien Nordmannrecommends that the patient continue with cycle #10 today  as scheduled.   We will see him  back for a follow up visit in 3 weeks for evaluation before starting cycle #11.   He will continue to follow with his PCP and ENT regarding his frequent sinus headache.   For the incidental finding of pulmonary embolism of the right middle lobe pulmonary artery, he is currently on Xarelto 20 mg p.o. daily.  He will continue with the same treatment for now.  The patient was advised to call immediately if he has any concerning symptoms in the interval. The patient voices understanding of current disease status and treatment options and is in agreement with the current care plan. All questions were answered. The patient knows to call the clinic with any problems, questions or concerns. We can certainly see the patient much sooner if necessary  No orders of the defined types were placed in this encounter.    Latif Nazareno L Ceniya Fowers, PA-C 04/25/19  ADDENDUM: Hematology/Oncology Attending: I had a face-to-face encounter with the patient today.  I recommended his care plan.  This is a very pleasant 73 years old white male with metastatic non-small cell lung cancer, adenocarcinoma.  He is status post induction treatment with carboplatin, Alimta and Keytruda for 4 cycles and he is currently on maintenance treatment with Alimta and Keytruda status post 4 more cycles.  The patient has been tolerating this treatment well with no concerning complaints except for mild fatigue. He had repeat CT scan of the chest, abdomen and pelvis performed recently.  I personally and independently reviewed the scan images with the patient and his wife today. His scan showed no concerning findings for disease progression. I recommended for the patient to continue his current treatment with maintenance Alimta and Keytruda and he will proceed with cycle #10 today. He will come back for follow-up visit in 3 weeks for evaluation before the next cycle of his treatment. The patient was advised to call immediately if he has any  concerning symptoms in the interval.   Disclaimer: This note was dictated with voice recognition software. Similar sounding words can inadvertently be transcribed and may be missed upon review. Eilleen Kempf, MD 04/25/19

## 2019-05-13 ENCOUNTER — Other Ambulatory Visit: Payer: Self-pay | Admitting: Internal Medicine

## 2019-05-16 ENCOUNTER — Inpatient Hospital Stay: Payer: Medicare Other | Attending: Internal Medicine

## 2019-05-16 ENCOUNTER — Encounter: Payer: Self-pay | Admitting: Internal Medicine

## 2019-05-16 ENCOUNTER — Inpatient Hospital Stay: Payer: Medicare Other

## 2019-05-16 ENCOUNTER — Inpatient Hospital Stay (HOSPITAL_BASED_OUTPATIENT_CLINIC_OR_DEPARTMENT_OTHER): Payer: Medicare Other | Admitting: Internal Medicine

## 2019-05-16 ENCOUNTER — Other Ambulatory Visit: Payer: Self-pay

## 2019-05-16 VITALS — BP 153/80 | HR 84 | Temp 98.2°F | Resp 18 | Ht 67.0 in | Wt 210.2 lb

## 2019-05-16 DIAGNOSIS — Z5112 Encounter for antineoplastic immunotherapy: Secondary | ICD-10-CM

## 2019-05-16 DIAGNOSIS — I10 Essential (primary) hypertension: Secondary | ICD-10-CM | POA: Diagnosis not present

## 2019-05-16 DIAGNOSIS — Z95828 Presence of other vascular implants and grafts: Secondary | ICD-10-CM

## 2019-05-16 DIAGNOSIS — C3492 Malignant neoplasm of unspecified part of left bronchus or lung: Secondary | ICD-10-CM

## 2019-05-16 DIAGNOSIS — Z5111 Encounter for antineoplastic chemotherapy: Secondary | ICD-10-CM | POA: Insufficient documentation

## 2019-05-16 DIAGNOSIS — C7951 Secondary malignant neoplasm of bone: Secondary | ICD-10-CM | POA: Insufficient documentation

## 2019-05-16 DIAGNOSIS — Z79899 Other long term (current) drug therapy: Secondary | ICD-10-CM | POA: Insufficient documentation

## 2019-05-16 DIAGNOSIS — I25810 Atherosclerosis of coronary artery bypass graft(s) without angina pectoris: Secondary | ICD-10-CM | POA: Diagnosis not present

## 2019-05-16 DIAGNOSIS — R5382 Chronic fatigue, unspecified: Secondary | ICD-10-CM

## 2019-05-16 DIAGNOSIS — C3412 Malignant neoplasm of upper lobe, left bronchus or lung: Secondary | ICD-10-CM | POA: Insufficient documentation

## 2019-05-16 LAB — CBC WITH DIFFERENTIAL (CANCER CENTER ONLY)
Abs Immature Granulocytes: 0.01 10*3/uL (ref 0.00–0.07)
Basophils Absolute: 0 10*3/uL (ref 0.0–0.1)
Basophils Relative: 1 %
Eosinophils Absolute: 0.1 10*3/uL (ref 0.0–0.5)
Eosinophils Relative: 3 %
HCT: 36 % — ABNORMAL LOW (ref 39.0–52.0)
Hemoglobin: 12.4 g/dL — ABNORMAL LOW (ref 13.0–17.0)
Immature Granulocytes: 0 %
Lymphocytes Relative: 30 %
Lymphs Abs: 1.2 10*3/uL (ref 0.7–4.0)
MCH: 33.7 pg (ref 26.0–34.0)
MCHC: 34.4 g/dL (ref 30.0–36.0)
MCV: 97.8 fL (ref 80.0–100.0)
Monocytes Absolute: 0.6 10*3/uL (ref 0.1–1.0)
Monocytes Relative: 15 %
Neutro Abs: 2 10*3/uL (ref 1.7–7.7)
Neutrophils Relative %: 51 %
Platelet Count: 160 10*3/uL (ref 150–400)
RBC: 3.68 MIL/uL — ABNORMAL LOW (ref 4.22–5.81)
RDW: 14.9 % (ref 11.5–15.5)
WBC Count: 4 10*3/uL (ref 4.0–10.5)
nRBC: 0 % (ref 0.0–0.2)

## 2019-05-16 LAB — CMP (CANCER CENTER ONLY)
ALT: 27 U/L (ref 0–44)
AST: 35 U/L (ref 15–41)
Albumin: 3.4 g/dL — ABNORMAL LOW (ref 3.5–5.0)
Alkaline Phosphatase: 79 U/L (ref 38–126)
Anion gap: 7 (ref 5–15)
BUN: 25 mg/dL — ABNORMAL HIGH (ref 8–23)
CO2: 26 mmol/L (ref 22–32)
Calcium: 8.9 mg/dL (ref 8.9–10.3)
Chloride: 100 mmol/L (ref 98–111)
Creatinine: 0.99 mg/dL (ref 0.61–1.24)
GFR, Est AFR Am: >60 mL/min (ref >60)
GFR, Estimated: >60 mL/min (ref >60)
Glucose, Bld: 92 mg/dL (ref 70–99)
Potassium: 3.9 mmol/L (ref 3.5–5.1)
Sodium: 133 mmol/L — ABNORMAL LOW (ref 135–145)
Total Bilirubin: 0.4 mg/dL (ref 0.3–1.2)
Total Protein: 7.1 g/dL (ref 6.5–8.1)

## 2019-05-16 LAB — TSH: TSH: 1.464 u[IU]/mL (ref 0.320–4.118)

## 2019-05-16 MED ORDER — SODIUM CHLORIDE 0.9% FLUSH
10.0000 mL | INTRAVENOUS | Status: DC | PRN
  Administered 2019-05-16: 10 mL
  Filled 2019-05-16: qty 10

## 2019-05-16 MED ORDER — SODIUM CHLORIDE 0.9 % IV SOLN
Freq: Once | INTRAVENOUS | Status: AC
  Administered 2019-05-16: 11:00:00 via INTRAVENOUS
  Filled 2019-05-16: qty 250

## 2019-05-16 MED ORDER — HEPARIN SOD (PORK) LOCK FLUSH 100 UNIT/ML IV SOLN
500.0000 [IU] | Freq: Once | INTRAVENOUS | Status: AC | PRN
  Administered 2019-05-16: 13:00:00 500 [IU]
  Filled 2019-05-16: qty 5

## 2019-05-16 MED ORDER — SODIUM CHLORIDE 0.9% FLUSH
10.0000 mL | Freq: Once | INTRAVENOUS | Status: AC
  Administered 2019-05-16: 10 mL
  Filled 2019-05-16: qty 10

## 2019-05-16 MED ORDER — PROCHLORPERAZINE MALEATE 10 MG PO TABS
10.0000 mg | ORAL_TABLET | Freq: Once | ORAL | Status: AC
  Administered 2019-05-16: 10 mg via ORAL

## 2019-05-16 MED ORDER — SODIUM CHLORIDE 0.9 % IV SOLN
200.0000 mg | Freq: Once | INTRAVENOUS | Status: AC
  Administered 2019-05-16: 12:00:00 200 mg via INTRAVENOUS
  Filled 2019-05-16: qty 8

## 2019-05-16 MED ORDER — PROCHLORPERAZINE MALEATE 10 MG PO TABS
ORAL_TABLET | ORAL | Status: AC
  Filled 2019-05-16: qty 1

## 2019-05-16 MED ORDER — SODIUM CHLORIDE 0.9 % IV SOLN
480.0000 mg/m2 | Freq: Once | INTRAVENOUS | Status: AC
  Administered 2019-05-16: 13:00:00 1000 mg via INTRAVENOUS
  Filled 2019-05-16: qty 40

## 2019-05-16 NOTE — Patient Instructions (Signed)
[facility] Discharge Instructions for Patients Receiving Chemotherapy  Today you received the following chemotherapy agents: Pembrolizumab (Keytruda) and Pemetrexed (Alimta)  To help prevent nausea and vomiting after your treatment, we encourage you to take your nausea medication as directed.   If you develop nausea and vomiting that is not controlled by your nausea medication, call the clinic.   BELOW ARE SYMPTOMS THAT SHOULD BE REPORTED IMMEDIATELY:  *FEVER GREATER THAN 100.5 F  *CHILLS WITH OR WITHOUT FEVER  NAUSEA AND VOMITING THAT IS NOT CONTROLLED WITH YOUR NAUSEA MEDICATION  *UNUSUAL SHORTNESS OF BREATH  *UNUSUAL BRUISING OR BLEEDING  TENDERNESS IN MOUTH AND THROAT WITH OR WITHOUT PRESENCE OF ULCERS  *URINARY PROBLEMS  *BOWEL PROBLEMS  UNUSUAL RASH Items with * indicate a potential emergency and should be followed up as soon as possible.  Feel free to call the clinic should you have any questions or concerns. The clinic phone number is [phone].  Please show the [facility] at check-in to the Emergency Department and triage nurse.  Coronavirus (COVID-19) Are you at risk?  Are you at risk for the Coronavirus (COVID-19)?  To be considered HIGH RISK for Coronavirus (COVID-19), you have to meet the following criteria:  . Traveled to [place], [place], [place], [place] or [place]; or in the [place] to [place], [place], [place], or [place]; and have fever, cough, and shortness of breath within the last 2 weeks of travel OR . Been in close contact with a person diagnosed with COVID-19 within the last 2 weeks and have fever, cough, and shortness of breath . IF YOU DO NOT MEET THESE CRITERIA, YOU ARE CONSIDERED LOW RISK FOR COVID-19.  What to do if you are HIGH RISK for COVID-19?  Marland Kitchen If you are having a medical emergency, call 911. . Seek medical care right away. Before you go to a doctor's office, urgent care or emergency department,  call ahead and tell them about your recent travel, contact with someone diagnosed with COVID-19, and your symptoms. You should receive instructions from your physician's office regarding next steps of care.  . When you arrive at healthcare provider, tell the healthcare staff immediately you have returned from visiting [place], [place], [place], [place] or [place]; or traveled in the [place] to [place], [place], [place], or [place]; in the last two weeks or you have been in close contact with a person diagnosed with COVID-19 in the last 2 weeks.   . Tell the health care staff about your symptoms: fever, cough and shortness of breath. . After you have been seen by a medical provider, you will be either: o Tested for (COVID-19) and discharged home on quarantine except to seek medical care if symptoms worsen, and asked to  - Stay home and avoid contact with others until you get your results (4-5 days)  - Avoid travel on public transportation if possible (such as bus, train, or airplane) or o Sent to the Emergency Department by EMS for evaluation, COVID-19 testing, and possible admission depending on your condition and test results.  What to do if you are LOW RISK for COVID-19?  Reduce your risk of any infection by using the same precautions used for avoiding the common cold or flu:  Marland Kitchen Wash your hands often with soap and warm water for at least 20 seconds.  If soap and water are not readily available, use an alcohol-based hand sanitizer with at least 60% alcohol.  Marland Kitchen  If coughing or sneezing, cover your mouth and nose by coughing or sneezing into the elbow areas of your shirt or coat, into a tissue or into your sleeve (not your hands). . Avoid shaking hands with others and consider head nods or verbal greetings only. . Avoid touching your eyes, nose, or mouth with unwashed hands.  . Avoid close contact with people who are sick. . Avoid places or events with large numbers of people in one  location, like concerts or sporting events. . Carefully consider travel plans you have or are making. . If you are planning any travel outside or inside the [name], visit the CDC's Travelers' Health webpage for the latest health notices. . If you have some symptoms but not all symptoms, continue to monitor at home and seek medical attention if your symptoms worsen. . If you are having a medical emergency, call 911.   [place] / e-Visit: [email or web address]         [facility]: [facility]: [phone]                   [facility]: [phone]

## 2019-05-16 NOTE — Progress Notes (Signed)
[facility] [phone]   [phone]  OFFICE PROGRESS NOTE  [name], MD [address] [place] [address]  DIAGNOSIS:  1) Stage IV (T1c, N0, M1 C) non-small cell lung cancer, adenocarcinoma presented with left upper lobe lung nodule in addition to metastatic disease to the bone diagnosed in April 2020. 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   3) incidental finding of pulmonary embolism involving the right middle lobe pulmonary artery on CT scan on 12/18/2018.  Molecular studies by [employer] 956  STK11Splice Site SNV 2.1% Everolimus,Temsirolimus  [id number] 0.8% None (VUS), No (VUS)  NTRK3 L629L 1.5%  (Synonymous) No (Synonymous)  KRAS G12D 1.1% Binimetinib  CDKN2A H83Y 1.6% Abemaciclib, Palbociclib, Ribociclib  PDL1 TPS was 0%  PRIOR THERAPY: None  CURRENT THERAPY:  1) Systemic chemotherapy with carboplatin for AUC of 5, Alimta 500 mg/M2 and Keytruda 200 mg IV every 3 weeks.  First dose Oct 16, 2018.  Status post 10 cycles.  Starting from cycle #5 the patient will be on maintenance treatment with Alimta and Keytruda. 2) Xarelto initially 15 mg p.o. twice daily for 3 weeks followed by 20 mg p.o. daily.  He started the first dose of this treatment on 12/20/2018.  INTERVAL HISTORY: Darrell SargentSouthern 73y.o. male returns to the clinic today for follow-up visit.  The patient is feeling fine today with no concerning complaints except for postnasal drainage and mild cough.  He denied having any current chest pain, shortness of breath or hemoptysis.  He denied having any fever or chills.  He has no nausea, vomiting, diarrhea or constipation.  He denied having any headache or visual changes.  He has been tolerating his maintenance treatment with Alimta and Keytruda fairly well.  The patient is here today for evaluation before starting cycle  #11.  MEDICAL HISTORY: Past Medical History:  Diagnosis Date   Anginal pain ([place]    Anxiety    treated /w Xanax for mild depression , using 2 times per day, not PRN   Asthma    Benign prostatic hypertrophy    CAD (coronary artery disease)    pt last left heart cath was in Nov 2008. EF was 55% on left ventriculogram. Circumflex was totally occluded after the 1st obtuse marginal with collaterals supplying the distal circumflex. LAD showed luminal irregularities. The stent in LAD was patent. The RCA showed luminal irregularities. The stent in th emid RCA and PDA were patent. There were no inverventions.    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 Patient did have a Nissen fundoplication rare   Heart murmur    heard 30 yrs ago   History of blood transfusion    need for Bld. transfusion relative to taking NSAIDS   HTN (hypertension)    pt. followed by [facility], last cardiac visit 2012, one yr. 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 Pt. states he has been found by prev. hosp. staff that he has to be told when to breathe & his wife does the same.    Spinal fracture    hx of traumatic in Jun 04, 2007 after falling off the roof   Tinnitus, bilateral    Vitamin D deficiency     ALLERGIES:  is allergic to codeine; meloxicam; morphine and related; nsaids; oxycodone; crestor [rosuvastatin]; cymbalta [duloxetine hcl]; dilaudid [hydromorphone hcl]; effexor [venlafaxine]; gluten meal; topamax [topiramate]; tramadol hcl; trazodone and nefazodone; adhesive [tape]; penicillins; and sulfa antibiotics.  MEDICATIONS:  Current Outpatient Medications  Medication Sig Dispense Refill   acetaminophen (TYLENOL) 500 MG tablet Take 1 tablet (500 mg total) by mouth every 6 (six) hours as needed. 30 tablet 0   albuterol (VENTOLIN HFA) 108 (90 Base) MCG/ACT inhaler Inhale 2 puffs into the lungs every 4 (four) hours as needed for wheezing or shortness of breath. 18 g 1   Alpha-Lipoic Acid 200 MG CAPS Take by mouth 2 (two) times daily.     Ascorbic Acid (VITAMIN C) 1000 MG tablet Take 1,000 mg by mouth 2 (two) times daily.     bisoprolol-hydrochlorothiazide (ZIAC) 5-6.25 MG tablet Take 1 tablet Daily for BP 90 tablet 3   Blood Glucose Monitoring Suppl (FREESTYLE FREEDOM LITE) w/Device KIT Check blood sugar 1 time a day. [id number] 1 each 0   budesonide-formoterol (SYMBICORT) 160-4.5 MCG/ACT inhaler Use twice daily, wash your mouth afterwards to avoid yeast. 1 Inhaler 12   Cholecalciferol (VITAMIN D3) 5000 units CAPS Take 5,000 Units by mouth 2 (two) times daily.     CHOLINE PO Take 600 mg by mouth daily.     CINNAMON PO Take by mouth 2 (two) times daily.     diazepam (VALIUM) 5 MG tablet Take 1/2 to 1 tablet 2 to 3 x /day as needed for Anxiety 270 tablet 0   doxycycline (VIBRA-TABS) 100 MG tablet Take 1 tablet (100 mg total) by mouth 2  (two) times daily. 14 tablet 0   DOXYLAMINE SUCCINATE PO Take 0.5 tablets by mouth at bedtime.      folic acid (FOLVITE) 1 MG tablet Take 1 tablet (1 mg total) by mouth daily. 30 tablet 4   Icosapent Ethyl (VASCEPA) 1 g CAPS Take 2 capsules (2 g total) by mouth 2 (two) times a day. 120 capsule 11   ipratropium (ATROVENT) 0.03 % nasal spray Place 1-2 sprays into both nostrils daily as needed (for allergies.). 30 mL 3   lidocaine-prilocaine (EMLA) cream Apply 1 application topically as needed. 30 g 0   magnesium oxide (MAG-OX) 400 MG tablet Take 1,200 mg by mouth as  needed.     MELATONIN PO Take 30 mg by mouth at bedtime.     Multiple Vitamins-Minerals (MULTIVITAMIN ADULT PO) Take by mouth daily.     nitroGLYCERIN (NITROSTAT) 0.4 MG SL tablet Dissolve 1 tablet under tongue every 3 to 5 minutes if needed for Chest Pain 50 tablet 3   OVER THE COUNTER MEDICATION Taking OTC Iron 1 tablet daily.     prochlorperazine (COMPAZINE) 10 MG tablet Take 1 tablet (10 mg total) by mouth every 6 (six) hours as needed for nausea or vomiting. 30 tablet 0   rosuvastatin (CRESTOR) 5 MG tablet Take 1 tablet (5 mg total) by mouth 2 (two) times a week. Take one tablet by mouth once a week for a few weeks, increase to twice weekly as tolerated 24 tablet 3   SUPER B COMPLEX/C PO Take by mouth daily.     tamsulosin (FLOMAX) 0.4 MG CAPS capsule Take 1 capsule Daily for for Prostate 90 capsule 3   tetrahydrozoline-zinc (VISINE-AC) 0.05-0.25 % ophthalmic solution Place 1-2 drops into both eyes 3 (three) times daily as needed (for redness/irritation.).     triamcinolone (NASACORT) 55 MCG/ACT AERO nasal inhaler Place 2 sprays into the nose at bedtime. 1 Inhaler 3   Triamcinolone Acetonide (NASAL ALLERGY 24 HOUR NA) SMARTSIG:2 Spray(s) Both Nares Every Night     triamcinolone ointment (KENALOG) 0.1 % Apply 1 application topically 2 (two) times daily. 80 g 1   Turmeric 500 MG TABS Take by mouth. Takes 3 in the  morning and 3 in the evening     VALERIAN PO Take 400 mg by mouth daily.     XARELTO 20 MG TABS tablet TAKE 1 TABLET BY MOUTH EVERY DAY WITH SUPPER 30 tablet 2   Zinc 50 MG TABS Take by mouth daily.     No current facility-administered medications for this visit.     SURGICAL HISTORY:  Past Surgical History:  Procedure Laterality Date   BACK SURGERY     x3- last surgery lumbar- 1998- / fusion    blepheroplasty     both eyesx2   C5-T6 posterior fusion     with [place] and radius screws   [place], 2008 & 2012 multiple stents  total 4 times   cataracts     cataracts removed- /w IOL - both eyes    CERVICAL SPINE SURGERY     COLONOSCOPY     ESOPHAGEAL MANOMETRY     HARDWARE REMOVAL  02/20/2012   Procedure: HARDWARE REMOVAL;  Surgeon: [name], MD;  Location: [facility];  Service: Neurosurgery;  Laterality: N/A;  Hardware Removal   HIATAL HERNIA REPAIR  1979, spring 2009   IR IMAGING GUIDED PORT INSERTION  10/29/2018   LAPAROSCOPIC CHOLECYSTECTOMY     & IOC   multiple percutaneous coronary interventions     NASAL SINUS SURGERY     x2, sees Dr. [name], still having problems, states he uses Benadryl PRN- up to 5 times per day    right temporal artery biopsy     right wrist plate insertion  82/7078   ROBOTIC ASSITED PARTIAL NEPHRECTOMY Left 03/02/2018   Procedure: XI ROBOTIC ASSITED LEFT PARTIAL NEPHRECTOMY WITH LYSIS OF ADHESIONS X30 MINUTES.;  Surgeon: Ardis Hughs, MD;  Location: [facility];  Service: Urology;  Laterality: Left;  CLAMP TIME FOR KIDNEY 1053-1110 TOTAL 18MINUTES   Rotator cuff surgery Right    SKIN BIOPSY Right 10/31/2017   Chest  TRANSURETHRAL RESECTION OF BLADDER TUMOR N/A 11/03/2017   Procedure: cystoscopy;  Surgeon: Kathie Rhodes, MD;  Location: [facility];  Service: Urology;  Laterality: N/A;   UMBILICAL HERNIA REPAIR     UPPER GI ENDOSCOPY     VENTRAL HERNIA REPAIR N/A 03/02/2018   Procedure: LAPAROSCOPIC REPAIR  OF INCARCERATED INCISIONAL HERNIA WITH LYSIS OF ADHESIONS;  Surgeon: Michael Boston, MD;  Location: [facility];  Service: General;  Laterality: N/A;    REVIEW OF SYSTEMS:  A comprehensive review of systems was negative except for: Constitutional: positive for fatigue Ears, nose, mouth, throat, and face: positive for Postnasal drainage Respiratory: positive for dyspnea on exertion   PHYSICAL EXAMINATION: General appearance: alert, cooperative, fatigued and no distress Head: Normocephalic, without obvious abnormality, atraumatic Neck: no adenopathy, no JVD, supple, symmetrical, trachea midline and thyroid not enlarged, symmetric, no tenderness/mass/nodules Lymph nodes: Cervical, supraclavicular, and axillary nodes normal. Resp: clear to auscultation bilaterally Back: symmetric, no curvature. ROM normal. No CVA tenderness. Cardio: regular rate and rhythm, S1, S2 normal, no murmur, click, rub or gallop GI: soft, non-tender; bowel sounds normal; no masses,  no organomegaly Extremities: extremities normal, atraumatic, no cyanosis or edema  ECOG PERFORMANCE STATUS: 1 - Symptomatic but completely ambulatory  Blood pressure (!) 153/80, pulse 84, temperature 98.2 F (36.8 C), temperature source Temporal, resp. rate 18, height 5' 7"  (1.702 m), weight 210 lb 3.2 oz (95.3 kg), SpO2 96 %.  LABORATORY DATA: Lab Results  Component Value Date   WBC 4.0 05/16/2019   HGB 12.4 (L) 05/16/2019   HCT 36.0 (L) 05/16/2019   MCV 97.8 05/16/2019   PLT 160 05/16/2019      Chemistry      Component Value Date/Time   NA 134 (L) 04/25/2019 1057   K 4.2 04/25/2019 1057   CL 99 04/25/2019 1057   CO2 25 04/25/2019 1057   BUN 22 04/25/2019 1057   CREATININE 1.18 04/25/2019 1057   CREATININE 1.10 11/28/2018 0959      Component Value Date/Time   CALCIUM 9.0 04/25/2019 1057   ALKPHOS 83 04/25/2019 1057   AST 36 04/25/2019 1057   ALT 29 04/25/2019 1057   BILITOT 0.4 04/25/2019 1057       RADIOGRAPHIC  STUDIES: Ct Chest W Contrast  Result Date: 04/23/2019 CLINICAL DATA:  Lung cancer restaging EXAM: CT CHEST, ABDOMEN, AND PELVIS WITH CONTRAST TECHNIQUE: Multidetector CT imaging of the chest, abdomen and pelvis was performed following the standard protocol during bolus administration of intravenous contrast. CONTRAST:  150m OMNIPAQUE IOHEXOL 300 MG/ML SOLN, additional oral enteric contrast COMPARISON:  02/19/2019 FINDINGS: CT CHEST FINDINGS Cardiovascular: Right chest port catheter. Aortic atherosclerosis. Normal heart size. Three-vessel coronary artery calcifications and/or stents. No pericardial effusion. Mediastinum/Nodes: No enlarged mediastinal, hilar, or axillary lymph nodes. Thyroid gland, trachea, and esophagus demonstrate no significant findings. Lungs/Pleura: Unchanged spiculated nodule of the left upper lobe measuring 1.4 x 1.2 cm (series 11, image 42). Bibasilar scarring. No pleural effusion or pneumothorax. Musculoskeletal: Unchanged sclerotic lesion of the left glenoid, which may be a metastatic lesion or degenerative subchondral cyst (series 7, image 13). Unchanged chronic fracture deformity of the sternal body. High-grade wedge deformities of the upper thoracic spine and focal kyphosis with bridging posterior cervicothoracic fusion. CT ABDOMEN PELVIS FINDINGS Hepatobiliary: No focal liver abnormality is seen. Status post cholecystectomy. No biliary dilatation. Pancreas: Unremarkable. No pancreatic ductal dilatation or surrounding inflammatory changes. Spleen: Normal in size without significant abnormality. Adrenals/Urinary Tract: Adrenal glands are unremarkable. Nonobstructive inferior pole calculus of  the left kidney. Unchanged postoperative stranding about the superior pole of the left kidney. Bladder is unremarkable. Stomach/Bowel: Hiatal hernia repair. Stomach is within normal limits. Appendix appears normal. No evidence of bowel wall thickening, distention, or inflammatory changes. Sigmoid  diverticulosis. Vascular/Lymphatic: Severe aortic atherosclerosis. No enlarged abdominal or pelvic lymph nodes. Reproductive: Prostatomegaly. Other: No abdominal wall hernia or abnormality. No abdominopelvic ascites. Musculoskeletal: Unchanged, densely sclerotic osseous lesions of the pelvis, including a dominant lesion with a calcified soft tissue component of the left ilium (series 7, image 143). Bone harvest site of the posterior right ilium. Posterior lumbar fusion. IMPRESSION: 1. Unchanged spiculated nodule of the left upper lobe measuring 1.4 x 1.2 cm (series 11, image 42), consistent with treated malignancy. 2. Unchanged sclerotic lesion of the left glenoid, which may be a metastatic lesion or degenerative subchondral cyst (series 7, image 13). Unchanged, densely sclerotic osseous lesions of the pelvis, including a dominant lesion with a calcified soft tissue component of the left ilium (series 7, image 143). Findings are consistent with stable osseous metastatic disease. 3. Other chronic, incidental, and postoperative findings as detailed above. Aortic Atherosclerosis (ICD10-I70.0). Electronically Signed   By: Eddie Candle M.D.   On: 04/23/2019 11:13   Ct Abdomen Pelvis W Contrast  Result Date: 04/23/2019 CLINICAL DATA:  Lung cancer restaging EXAM: CT CHEST, ABDOMEN, AND PELVIS WITH CONTRAST TECHNIQUE: Multidetector CT imaging of the chest, abdomen and pelvis was performed following the standard protocol during bolus administration of intravenous contrast. CONTRAST:  1107m OMNIPAQUE IOHEXOL 300 MG/ML SOLN, additional oral enteric contrast COMPARISON:  02/19/2019 FINDINGS: CT CHEST FINDINGS Cardiovascular: Right chest port catheter. Aortic atherosclerosis. Normal heart size. Three-vessel coronary artery calcifications and/or stents. No pericardial effusion. Mediastinum/Nodes: No enlarged mediastinal, hilar, or axillary lymph nodes. Thyroid gland, trachea, and esophagus demonstrate no significant  findings. Lungs/Pleura: Unchanged spiculated nodule of the left upper lobe measuring 1.4 x 1.2 cm (series 11, image 42). Bibasilar scarring. No pleural effusion or pneumothorax. Musculoskeletal: Unchanged sclerotic lesion of the left glenoid, which may be a metastatic lesion or degenerative subchondral cyst (series 7, image 13). Unchanged chronic fracture deformity of the sternal body. High-grade wedge deformities of the upper thoracic spine and focal kyphosis with bridging posterior cervicothoracic fusion. CT ABDOMEN PELVIS FINDINGS Hepatobiliary: No focal liver abnormality is seen. Status post cholecystectomy. No biliary dilatation. Pancreas: Unremarkable. No pancreatic ductal dilatation or surrounding inflammatory changes. Spleen: Normal in size without significant abnormality. Adrenals/Urinary Tract: Adrenal glands are unremarkable. Nonobstructive inferior pole calculus of the left kidney. Unchanged postoperative stranding about the superior pole of the left kidney. Bladder is unremarkable. Stomach/Bowel: Hiatal hernia repair. Stomach is within normal limits. Appendix appears normal. No evidence of bowel wall thickening, distention, or inflammatory changes. Sigmoid diverticulosis. Vascular/Lymphatic: Severe aortic atherosclerosis. No enlarged abdominal or pelvic lymph nodes. Reproductive: Prostatomegaly. Other: No abdominal wall hernia or abnormality. No abdominopelvic ascites. Musculoskeletal: Unchanged, densely sclerotic osseous lesions of the pelvis, including a dominant lesion with a calcified soft tissue component of the left ilium (series 7, image 143). Bone harvest site of the posterior right ilium. Posterior lumbar fusion. IMPRESSION: 1. Unchanged spiculated nodule of the left upper lobe measuring 1.4 x 1.2 cm (series 11, image 42), consistent with treated malignancy. 2. Unchanged sclerotic lesion of the left glenoid, which may be a metastatic lesion or degenerative subchondral cyst (series 7, image 13).  Unchanged, densely sclerotic osseous lesions of the pelvis, including a dominant lesion with a calcified soft tissue component of the left ilium (series  7, image 143). Findings are consistent with stable osseous metastatic disease. 3. Other chronic, incidental, and postoperative findings as detailed above. Aortic Atherosclerosis (ICD10-I70.0). Electronically Signed   By: Eddie Candle M.D.   On: 04/23/2019 11:13    ASSESSMENT AND PLAN: This is a very pleasant 73 years old white male recently diagnosed with metastatic non-small cell lung cancer, adenocarcinoma presented with left upper lobe lung nodule in addition to metastatic bone disease diagnosed in April 2020. The molecular studies showed no actionable mutations and PDL 1 expression was negative. The patient also has a history of renal cell carcinoma status post wedge resection of the left kidney in September 2019. The patient is currently undergoing systemic chemotherapy with carboplatin for AUC of 5, Alimta 500 mg/M2 and Keytruda 200 mg IV every 3 weeks status post 10 cycles.  Starting from cycle #5 the patient will be treated with maintenance Alimta and Keytruda every 3 weeks. The patient continues to tolerate his treatment well with no concerning adverse effects. I recommended for him to proceed with cycle #11 today as planned. I will see him back for follow-up visit in 3 weeks for evaluation before starting cycle #12. For the incidental finding of pulmonary embolism of the right middle lobe pulmonary artery, he is currently on Xarelto 20 mg p.o. daily.  He will continue with the same treatment for now. The patient was advised to call immediately if he has any concerning symptoms in the interval. The patient voices understanding of current disease status and treatment options and is in agreement with the current care plan. All questions were answered. The patient knows to call the clinic with any problems, questions or concerns. We can certainly  see the patient much sooner if necessary.  Disclaimer: This note was dictated with voice recognition software. Similar sounding words can inadvertently be transcribed and may not be corrected upon review.

## 2019-05-16 NOTE — Patient Instructions (Signed)

## 2019-05-17 ENCOUNTER — Encounter: Payer: Self-pay | Admitting: Internal Medicine

## 2019-05-17 ENCOUNTER — Telehealth: Payer: Self-pay | Admitting: Internal Medicine

## 2019-05-17 NOTE — Telephone Encounter (Signed)
Scheduled per los. Called and left msg. Mailed printout  °

## 2019-05-22 NOTE — Telephone Encounter (Signed)
I spoke with [name] and told him [name] was fine with him getting a steriod injection in his shoulder.  We reviewed his upcoming appointments.  I told him to discuss the covid vaccine when he sees [name] in 2 weeks.  [name] verbalized understanding.

## 2019-05-27 ENCOUNTER — Encounter: Payer: Self-pay | Admitting: Adult Health

## 2019-05-28 ENCOUNTER — Telehealth: Payer: Self-pay

## 2019-05-28 NOTE — Telephone Encounter (Signed)
Opened in error

## 2019-06-02 ENCOUNTER — Encounter: Payer: Self-pay | Admitting: Internal Medicine

## 2019-06-02 NOTE — Progress Notes (Signed)
Comprehensive Evaluation & Examination     This very nice 73 y.o. MWM presents for a  comprehensive evaluation and management of multiple medical co-morbidities.  Patient has been followed for HTN,  ASCAD, HLD, Prediabetes and Vitamin D Deficiency.     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 In July , he was dx'd with a RML Pulm Embolus and started on Xarelto. 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     HTN predates since 1999. Patient's BP has been low since on Chemo, so he has stopped his Ziac.  Today's BP is at goal -  122/76. In 1992, he underwent PTCA w/Stents.  Patient denies any cardiac symptoms as chest pain, palpitations, shortness of breath, dizziness or ankle swelling.     Patient's hyperlipidemia is controlled with diet and medications. Patient denies myalgias or other medication SE's. Last lipids were  at goal:  Lab Results  Component Value Date   CHOL 150 02/28/2019   HDL 39 (L) 02/28/2019   LDLCALC 85 02/28/2019   TRIG 166 (H) 02/28/2019   CHOLHDL 3.8 02/28/2019       Patient has Moderate Obesity  (BMI 32.92) and  hx/o T2_DM (2012) and after losing weight his A1c's Normalized to 5.5% in 2014 which he has maintained til recent.  Patient denies reactive hypoglycemic symptoms, visual blurring, diabetic polys or paresthesias. Last A1c was near goal:  Lab Results  Component Value Date   HGBA1C 5.7 (H) 11/28/2018        Finally, patient has history of Vitamin D Deficiency   ("45"/ on  tx 2008) and last vitamin D was at goal (70-100):  Lab Results  Component Value Date   VD25OH 79 11/28/2018    Current Outpatient Medications on File Prior to Visit  Medication Sig  . acetaminophen (TYLENOL) 500 MG tablet Take 1 tablet (500 mg total) by mouth every 6 (six) hours as needed.  Marland Kitchen albuterol (VENTOLIN HFA) 108 (90 Base) MCG/ACT  inhaler Inhale 2 puffs into the lungs every 4 (four) hours as needed for wheezing or shortness of breath.  . Alpha-Lipoic Acid 200 MG CAPS Take by mouth 2 (two) times daily.  . Ascorbic Acid (VITAMIN C) 1000 MG tablet Take 1,000 mg by mouth 2 (two) times daily.  . Blood Glucose Monitoring Suppl (FREESTYLE FREEDOM LITE) w/Device KIT Check blood sugar 1 time a day. [id number]  . budesonide-formoterol (SYMBICORT) 160-4.5 MCG/ACT inhaler Use twice daily, wash your mouth afterwards to avoid yeast.  . Cholecalciferol (VITAMIN D3) 5000 units CAPS Take 5,000 Units by mouth 2 (two) times daily.  . CHOLINE PO Take 600 mg by mouth daily.  Marland Kitchen CINNAMON PO Take by mouth 2 (two) times daily.  . diazepam (VALIUM) 5 MG tablet Take 1/2 to 1 tablet 2 to 3 x /day as needed for Anxiety  . DOXYLAMINE SUCCINATE PO Take 0.5 tablets by mouth at bedtime.   . folic acid (FOLVITE) 1 MG tablet Take 1 tablet (1 mg total) by mouth daily.  Vanessa Kick Ethyl (VASCEPA) 1 g CAPS Take 2 capsules (2 g total) by mouth 2 (two) times a day.  . ipratropium (ATROVENT) 0.03 % nasal spray Place 1-2 sprays into both nostrils daily as needed (for allergies.).  Marland Kitchen lidocaine-prilocaine (EMLA) cream Apply 1 application topically as needed.  . magnesium oxide (MAG-OX) 400 MG  tablet Take 1,200 mg by mouth as needed.  Marland Kitchen MELATONIN PO Take 30 mg by mouth at bedtime.  . Multiple Vitamins-Minerals (MULTIVITAMIN ADULT PO) Take by mouth daily.  . nitroGLYCERIN (NITROSTAT) 0.4 MG SL tablet Dissolve 1 tablet under tongue every 3 to 5 minutes if needed for Chest Pain  . OVER THE COUNTER MEDICATION Taking OTC Iron 1 tablet daily.  . prochlorperazine (COMPAZINE) 10 MG tablet Take 1 tablet (10 mg total) by mouth every 6 (six) hours as needed for nausea or vomiting.  . rosuvastatin (CRESTOR) 5 MG tablet Take 1 tablet (5 mg total) by mouth 2 (two) times a week. Take one tablet by mouth once a week for a few weeks, increase to twice weekly as tolerated  . SUPER B  COMPLEX/C PO Take by mouth daily.  . tamsulosin (FLOMAX) 0.4 MG CAPS capsule Take 1 capsule Daily for for Prostate  . tetrahydrozoline-zinc (VISINE-AC) 0.05-0.25 % ophthalmic solution Place 1-2 drops into both eyes 3 (three) times daily as needed (for redness/irritation.).  Marland Kitchen triamcinolone (NASACORT) 55 MCG/ACT AERO nasal inhaler Place 2 sprays into the nose at bedtime.  . Triamcinolone Acetonide (NASAL ALLERGY 24 HOUR NA) SMARTSIG:2 Spray(s) Both Nares Every Night  . triamcinolone ointment (KENALOG) 0.1 % Apply 1 application topically 2 (two) times daily.  . Turmeric 500 MG TABS Take by mouth. Takes 3 in the morning and 3 in the evening  . VALERIAN PO Take 400 mg by mouth daily.  Alveda Reasons 20 MG TABS tablet TAKE 1 TABLET BY MOUTH EVERY DAY WITH SUPPER  . Zinc 50 MG TABS Take by mouth daily.  . bisoprolol-hydrochlorothiazide (ZIAC) 5-6.25 MG tablet Take 1 tablet Daily for BP (Patient not taking: Reported on 06/03/2019)   No current facility-administered medications on file prior to visit.   Allergies  Allergen Reactions  . Codeine Itching and Other (See Comments)    Also hallucinations   . Meloxicam Rash  . Morphine And Related Other (See Comments)    Hallucinations  . Nsaids Other (See Comments)    BLEEDING--naprosyn  . Oxycodone     Hallucinations  . Crestor [Rosuvastatin] Other (See Comments)    Soreness/aching  . Cymbalta [Duloxetine Hcl] Other (See Comments)    Pt is unsure of reaction type  . Dilaudid [Hydromorphone Hcl] Itching and Other (See Comments)    Hallucinations.  . Effexor [Venlafaxine] Other (See Comments)    Unsure of reactions type  . Gluten Meal Other (See Comments)    Runny nose (constant); bloating.  . Topamax [Topiramate] Other (See Comments)    Caused visual impairments/swelling in eyes--pinch off optic nerve (temporary blindness)  . Tramadol Hcl   . Trazodone And Nefazodone Other (See Comments)    Unsure of reaction type  . Adhesive [Tape] Other (See  Comments)    SKIN PEELS--PAPER TAPE IS OKAY  . Penicillins Rash  . Sulfa Antibiotics Swelling, Rash and Other (See Comments)    Mouth sores   Past Medical History:  Diagnosis Date  . Anginal pain ([place])   . Anxiety    treated /w Xanax for mild depression , using 2 times per day, not PRN  . Asthma   . Benign prostatic hypertrophy   . CAD (coronary artery disease)    pt last left heart cath was in Nov 2008. EF was 55% on left ventriculogram. Circumflex was totally occluded after the 1st obtuse marginal with collaterals supplying the distal circumflex. LAD showed luminal irregularities. The stent in LAD was patent.  The RCA showed luminal irregularities. The stent in th emid RCA and PDA were patent. There were no inverventions.   . Chest pain    from anxiety last time 1 month ago  . 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 Chronic obstructive pulmonary disease (HCC)    asthma  . Cluster headache    relative to sinus problems none recnt  . Constipation   . Depression   . DM2 (diabetes mellitus, type 2) ([place])    well with diet and exercise controlled  . GERD (gastroesophageal reflux disease)    hiatal hernia. Patient did have a Nissen fundoplication rare  . Heart murmur    heard 30 yrs ago  . History of blood transfusion    need for Bld. transfusion relative to taking NSAIDS  . HTN (hypertension)    pt. followed by [facility], last cardiac visit 2012, one yr. ago  . Hyperlipidemia   . Joint pain   . Lactose intolerance   . Left kidney mass   . Low back pain    chronic  . Metastatic non-small cell lung cancer (HCC)    adenocarcinoma, LUL, bone metastasis in 09/2018  . 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 Obesity   . OSA and COPD overlap syndrome ([place]) 02/25/2015   no cpap used pt denies sleep apnea  . Osteoarthritis   . Peptic ulcer disease   . Pneumonia not  recent per pt  . 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 Sleep concern    states the study (2003 )was done at [facility] it failed & he was told to return & he never did. Pt. states he has been found by prev. hosp. staff that he has to be told when to breathe & his wife does the same.   Marland Kitchen Spinal fracture    hx of traumatic in Jun 04, 2007 after falling off the roof  . Tinnitus, bilateral   . Vitamin D deficiency    Health Maintenance  Topic Date Due  . COLONOSCOPY  04/11/2021  . TETANUS/TDAP  12/08/2024  . INFLUENZA VACCINE  Completed  . Hepatitis C Screening  Completed  . PNA vac Low Risk Adult  Completed   Immunization History  Administered Date(s) Administered  . DT 12/09/2014  . Influenza Split 02/17/2012  . Influenza, High Dose Seasonal PF 03/03/2014, 03/11/2015, 04/04/2016, 04/28/2017, 03/05/2018, 02/28/2019  . Pneumococcal Conjugate-13 12/25/2014  . Pneumococcal Polysaccharide-23 01/19/2012  . Pneumococcal-Unspecified 06/13/1997  . Td 06/13/2004  . Zoster 06/14/2007   Last Colon - 04/11/2016 - Dr Arty Baumgartner - Recommended 5 yr f/u due Nov 2022  Past Surgical History:  Procedure Laterality Date  . BACK SURGERY     x3- last surgery lumbar- 1998- / fusion   . blepheroplasty     both eyesx2  . C5-T6 posterior fusion     with [place] and radius screws  . [place], 2008 & 2012 multiple stents  total 4 times  . cataracts     cataracts removed- /w IOL - both eyes   . CERVICAL SPINE SURGERY    . COLONOSCOPY    . ESOPHAGEAL MANOMETRY    . HARDWARE REMOVAL  02/20/2012   Procedure: HARDWARE REMOVAL;  Surgeon: [name], MD;  Location: [facility];  Service: Neurosurgery;  Laterality: N/A;  Hardware Removal  . HIATAL HERNIA REPAIR  1979, spring 2009  . IR IMAGING GUIDED PORT INSERTION  10/29/2018  . LAPAROSCOPIC CHOLECYSTECTOMY     & IOC  . multiple percutaneous coronary interventions    . NASAL SINUS  SURGERY     x2, sees Dr. [name], still having problems, states he uses Benadryl PRN- up to 5 times per day   . right temporal artery biopsy    . right wrist plate insertion  [id number]/5397  . ROBOTIC ASSITED PARTIAL NEPHRECTOMY Left 03/02/2018   Procedure: XI ROBOTIC ASSITED LEFT PARTIAL NEPHRECTOMY WITH LYSIS OF ADHESIONS X30 MINUTES.;  Surgeon: Ardis Hughs, MD;  Location: [facility];  Service: Urology;  Laterality: Left;  CLAMP TIME FOR KIDNEY 1053-1110 TOTAL 18MINUTES  . Rotator cuff surgery Right   . SKIN BIOPSY Right 10/31/2017   Chest  . TRANSURETHRAL RESECTION OF BLADDER TUMOR N/A 11/03/2017   Procedure: cystoscopy;  Surgeon: Kathie Rhodes, MD;  Location: [facility];  Service: Urology;  Laterality: N/A;  . UMBILICAL HERNIA REPAIR    . UPPER GI ENDOSCOPY    . VENTRAL HERNIA REPAIR N/A 03/02/2018   Procedure: LAPAROSCOPIC REPAIR OF INCARCERATED INCISIONAL HERNIA WITH LYSIS OF ADHESIONS;  Surgeon: Michael Boston, MD;  Location: [facility];  Service: General;  Laterality: N/A;   Family History  Problem Relation Age of Onset  . Heart failure Mother        in her 5s  . Hypertension Mother   . Heart attack Mother   . Obesity Mother   . Coronary artery disease Father        developed in his 24s  . Hypertension Father   . Heart attack Father   . Heart attack Brother        in there 26s  . Heart attack Brother        in there 80s  . Gout Sister    Social History   Socioeconomic History  . Marital status: Married    Spouse name: Dub Mikes  . Number of children: Not on file  Occupational History  . Occupation: Retired  Tobacco Use  . Smoking status: Former Smoker    Packs/day: 1.00    Years: 20.00    Pack years: 20.00    Types: Cigarettes    Quit date: 02/14/1978    Years since quitting: 41.3  . Smokeless tobacco: Never Used  Substance and Sexual Activity  . Alcohol use: Not Currently    Alcohol/week: 0.0 standard drinks    Comment: rare  . Drug use: No  . Sexual activity: Not on file       ROS Constitutional: Denies fever, chills, weight loss/gain, headaches, insomnia,  night sweats or change in appetite. Does c/o fatigue. Eyes: Denies redness, blurred vision, diplopia, discharge, itchy or watery eyes.  ENT: Denies discharge, congestion, post nasal drip, epistaxis, sore throat, earache, hearing loss, dental pain, Tinnitus, Vertigo, Sinus pain or snoring.  Cardio: Denies chest pain, palpitations, irregular heartbeat, syncope, dyspnea, diaphoresis, orthopnea, PND, claudication or edema Respiratory: denies cough, dyspnea, DOE, pleurisy, hoarseness, laryngitis or wheezing.  Gastrointestinal: Denies dysphagia, heartburn, reflux, water brash, pain, cramps, nausea, vomiting, bloating, diarrhea, constipation, hematemesis, melena, hematochezia, jaundice or hemorrhoids Genitourinary: Denies dysuria, frequency, urgency, nocturia, hesitancy, discharge, hematuria or flank pain Musculoskeletal: Denies arthralgia, myalgia, stiffness, Jt. Swelling, pain, limp or strain/sprain. Denies Falls. Skin: Denies puritis, rash, hives, warts, acne, eczema or change in skin lesion Neuro:  No weakness, tremor, incoordination, spasms, paresthesia or pain Psychiatric: Denies confusion, memory loss or sensory loss. Denies Depression. Endocrine: Denies change in weight, skin, hair change, nocturia, and paresthesia, diabetic polys, visual blurring or hyper / hypo glycemic episodes.  Heme/Lymph: No excessive bleeding, bruising or enlarged lymph nodes.  Physical Exam  BP 122/76   Pulse 88   Temp (!) 97 F (36.1 C)   Resp 16   Ht 5' 6.75" (1.695 m)   Wt 208 lb (94.3 kg)   BMI 32.82 kg/m   General Appearance: Well nourished and well groomed and in no apparent distress.  Eyes: PERRLA, EOMs, conjunctiva no swelling or erythema, normal fundi and vessels. Sinuses: No frontal/maxillary tenderness ENT/Mouth: EACs patent / TMs  nl. Nares clear without erythema, swelling, mucoid exudates. Oral hygiene is good.  No erythema, swelling, or exudate. Tongue normal, non-obstructing. Tonsils not swollen or erythematous. Hearing normal.  Neck: Supple, thyroid not palpable. No bruits, nodes or JVD. Respiratory: Respiratory effort normal.  BS equal and clear bilateral without rales, rhonci, wheezing or stridor. Cardio: Heart sounds are normal with regular rate and rhythm and no murmurs, rubs or gallops. Peripheral pulses are normal and equal bilaterally without edema. No aortic or femoral bruits. Chest: symmetric with normal excursions and percussion.  Abdomen: Soft, with Nl bowel sounds. Nontender, no guarding, rebound, hernias, masses, or organomegaly.  Lymphatics: Non tender without lymphadenopathy.  Musculoskeletal: Full ROM all peripheral extremities, joint stability, 5/5 strength, and normal gait. Skin: Warm and dry without rashes, lesions, cyanosis, clubbing or  ecchymosis.  Neuro: Cranial nerves intact, reflexes equal bilaterally. Normal muscle tone, no cerebellar symptoms. Sensation intact.  Pysch: Alert and oriented X 3 with normal affect, insight and judgment appropriate.   Assessment and Plan  1. Essential hypertension  - EKG 12-Lead - [name], retroperitnl abd,  ltd - Urinalysis, Routine w reflex microscopic - Microalbumin / Creatinine Urine Ratio - Magnesium  2. Hyperlipidemia, mixed  - EKG 12-Lead - [name], retroperitnl abd,  ltd - Lipid Profile  3. Abnormal glucose  - EKG 12-Lead - [name], retroperitnl abd,  ltd - Hemoglobin A1c ([facility]) - Insulin, random  4. Vitamin D deficiency  - Vitamin D (25 hydroxy)  5. Atherosclerosis of native coronary artery of  native heart without angina pectoris  - EKG 12-Lead - Lipid Profile  6. Adenocarcinoma of left lung, stage 4 ([place])   7. BPH/Prostatism  - PSA  8. OSA and COPD overlap syndrome ([place])   9. Prostate cancer screening  - PSA  10. Screening for colorectal cancer  - POC Hemoccult Bld/Stl  11. Screening for ischemic heart  disease  - EKG 12-Lead  12. FHx: heart disease  - EKG 12-Lead - [name], retroperitnl abd,  ltd  13. Aortic atherosclerosis (HCC)  - EKG 12-Lead - [name], retroperitnl abd,  ltd  14. Screening for AAA (aortic abdominal aneurysm)  - [name], retroperitnl abd,  ltd  15. Medication management  - Urinalysis, Routine w reflex microscopic - Microalbumin / Creatinine Urine Ratio - Magnesium - Lipid Profile - Hemoglobin A1c ([facility]) - Insulin, random - Vitamin D (25 hydroxy)         Patient was counseled in prudent diet, weight control to achieve/maintain BMI less than 25, BP monitoring, regular exercise and medications as discussed.  Discussed med effects and SE's. Routine screening labs and tests as requested with regular follow-up as recommended. Over 40 minutes of exam, counseling, chart review and high complex critical decision making was performed  Kirtland Bouchard, MD

## 2019-06-02 NOTE — Patient Instructions (Signed)

## 2019-06-03 ENCOUNTER — Other Ambulatory Visit: Payer: Self-pay

## 2019-06-03 ENCOUNTER — Ambulatory Visit (INDEPENDENT_AMBULATORY_CARE_PROVIDER_SITE_OTHER): Payer: Medicare Other | Admitting: Internal Medicine

## 2019-06-03 VITALS — BP 122/76 | HR 88 | Temp 97.0°F | Resp 16 | Ht 66.75 in | Wt 208.0 lb

## 2019-06-03 DIAGNOSIS — G4733 Obstructive sleep apnea (adult) (pediatric): Secondary | ICD-10-CM

## 2019-06-03 DIAGNOSIS — Z125 Encounter for screening for malignant neoplasm of prostate: Secondary | ICD-10-CM

## 2019-06-03 DIAGNOSIS — I700 Atherosclerosis of aorta: Secondary | ICD-10-CM | POA: Diagnosis not present

## 2019-06-03 DIAGNOSIS — Z8249 Family history of ischemic heart disease and other diseases of the circulatory system: Secondary | ICD-10-CM | POA: Diagnosis not present

## 2019-06-03 DIAGNOSIS — E559 Vitamin D deficiency, unspecified: Secondary | ICD-10-CM | POA: Diagnosis not present

## 2019-06-03 DIAGNOSIS — I10 Essential (primary) hypertension: Secondary | ICD-10-CM

## 2019-06-03 DIAGNOSIS — I2510 Atherosclerotic heart disease of native coronary artery without angina pectoris: Secondary | ICD-10-CM | POA: Diagnosis not present

## 2019-06-03 DIAGNOSIS — R7309 Other abnormal glucose: Secondary | ICD-10-CM | POA: Diagnosis not present

## 2019-06-03 DIAGNOSIS — E782 Mixed hyperlipidemia: Secondary | ICD-10-CM | POA: Diagnosis not present

## 2019-06-03 DIAGNOSIS — C3492 Malignant neoplasm of unspecified part of left bronchus or lung: Secondary | ICD-10-CM

## 2019-06-03 DIAGNOSIS — Z1211 Encounter for screening for malignant neoplasm of colon: Secondary | ICD-10-CM

## 2019-06-03 DIAGNOSIS — Z136 Encounter for screening for cardiovascular disorders: Secondary | ICD-10-CM

## 2019-06-03 DIAGNOSIS — N320 Bladder-neck obstruction: Secondary | ICD-10-CM

## 2019-06-03 DIAGNOSIS — Z79899 Other long term (current) drug therapy: Secondary | ICD-10-CM

## 2019-06-04 LAB — URINALYSIS, ROUTINE W REFLEX MICROSCOPIC
Bacteria, UA: NONE SEEN /HPF
Bilirubin Urine: NEGATIVE
Glucose, UA: NEGATIVE
Hgb urine dipstick: NEGATIVE
Hyaline Cast: NONE SEEN /LPF
Ketones, ur: NEGATIVE
Leukocytes,Ua: NEGATIVE
Nitrite: NEGATIVE
RBC / HPF: NONE SEEN /HPF (ref 0–2)
Specific Gravity, Urine: 1.018 (ref 1.001–1.03)
Squamous Epithelial / HPF: NONE SEEN /HPF (ref <=5)
WBC, UA: NONE SEEN /HPF (ref 0–5)
pH: 6.5 (ref 5.0–8.0)

## 2019-06-04 LAB — LIPID PANEL
Cholesterol: 154 mg/dL (ref <200)
HDL: 46 mg/dL (ref >=40)
LDL Cholesterol (Calc): 81 mg/dL (calc)
Non-HDL Cholesterol (Calc): 108 mg/dL (calc) (ref <130)
Total CHOL/HDL Ratio: 3.3 (calc) (ref <5.0)
Triglycerides: 170 mg/dL — ABNORMAL HIGH (ref <150)

## 2019-06-04 LAB — PSA: PSA: 4.2 ng/mL — ABNORMAL HIGH (ref <=4.0)

## 2019-06-04 LAB — HEMOGLOBIN A1C
Hgb A1c MFr Bld: 5.1 % of total Hgb (ref <5.7)
Mean Plasma Glucose: 100 (calc)
eAG (mmol/L): 5.5 (calc)

## 2019-06-04 LAB — MAGNESIUM: Magnesium: 1.8 mg/dL (ref 1.5–2.5)

## 2019-06-04 LAB — MICROALBUMIN / CREATININE URINE RATIO
Creatinine, Urine: 90 mg/dL (ref 20–320)
Microalb Creat Ratio: 108 mcg/mg creat — ABNORMAL HIGH (ref <30)
Microalb, Ur: 9.7 mg/dL

## 2019-06-04 LAB — VITAMIN D 25 HYDROXY (VIT D DEFICIENCY, FRACTURES): Vit D, 25-Hydroxy: 117 ng/mL — ABNORMAL HIGH (ref 30–100)

## 2019-06-04 LAB — INSULIN, RANDOM: Insulin: 88.7 u[IU]/mL — ABNORMAL HIGH

## 2019-06-05 ENCOUNTER — Encounter: Payer: Self-pay | Admitting: Internal Medicine

## 2019-06-06 ENCOUNTER — Encounter: Payer: Self-pay | Admitting: Internal Medicine

## 2019-06-06 ENCOUNTER — Other Ambulatory Visit: Payer: Self-pay

## 2019-06-06 ENCOUNTER — Inpatient Hospital Stay (HOSPITAL_BASED_OUTPATIENT_CLINIC_OR_DEPARTMENT_OTHER): Payer: Medicare Other | Admitting: Internal Medicine

## 2019-06-06 ENCOUNTER — Inpatient Hospital Stay: Payer: Medicare Other

## 2019-06-06 VITALS — BP 134/75 | HR 91 | Temp 98.2°F | Resp 18 | Ht 66.75 in | Wt 204.8 lb

## 2019-06-06 DIAGNOSIS — Z5111 Encounter for antineoplastic chemotherapy: Secondary | ICD-10-CM | POA: Diagnosis not present

## 2019-06-06 DIAGNOSIS — C3492 Malignant neoplasm of unspecified part of left bronchus or lung: Secondary | ICD-10-CM

## 2019-06-06 DIAGNOSIS — I10 Essential (primary) hypertension: Secondary | ICD-10-CM

## 2019-06-06 DIAGNOSIS — I25810 Atherosclerosis of coronary artery bypass graft(s) without angina pectoris: Secondary | ICD-10-CM

## 2019-06-06 DIAGNOSIS — C3490 Malignant neoplasm of unspecified part of unspecified bronchus or lung: Secondary | ICD-10-CM

## 2019-06-06 DIAGNOSIS — Z95828 Presence of other vascular implants and grafts: Secondary | ICD-10-CM

## 2019-06-06 DIAGNOSIS — Z5112 Encounter for antineoplastic immunotherapy: Secondary | ICD-10-CM | POA: Diagnosis not present

## 2019-06-06 LAB — CBC WITH DIFFERENTIAL (CANCER CENTER ONLY)
Abs Immature Granulocytes: 0.02 10*3/uL (ref 0.00–0.07)
Basophils Absolute: 0 10*3/uL (ref 0.0–0.1)
Basophils Relative: 1 %
Eosinophils Absolute: 0.1 10*3/uL (ref 0.0–0.5)
Eosinophils Relative: 1 %
HCT: 38.9 % — ABNORMAL LOW (ref 39.0–52.0)
Hemoglobin: 13.2 g/dL (ref 13.0–17.0)
Immature Granulocytes: 1 %
Lymphocytes Relative: 38 %
Lymphs Abs: 1.6 10*3/uL (ref 0.7–4.0)
MCH: 32.8 pg (ref 26.0–34.0)
MCHC: 33.9 g/dL (ref 30.0–36.0)
MCV: 96.5 fL (ref 80.0–100.0)
Monocytes Absolute: 0.5 10*3/uL (ref 0.1–1.0)
Monocytes Relative: 12 %
Neutro Abs: 2 10*3/uL (ref 1.7–7.7)
Neutrophils Relative %: 47 %
Platelet Count: 188 10*3/uL (ref 150–400)
RBC: 4.03 MIL/uL — ABNORMAL LOW (ref 4.22–5.81)
RDW: 14.8 % (ref 11.5–15.5)
WBC Count: 4.2 10*3/uL (ref 4.0–10.5)
nRBC: 0 % (ref 0.0–0.2)

## 2019-06-06 LAB — CMP (CANCER CENTER ONLY)
ALT: 25 U/L (ref 0–44)
AST: 30 U/L (ref 15–41)
Albumin: 3.5 g/dL (ref 3.5–5.0)
Alkaline Phosphatase: 90 U/L (ref 38–126)
Anion gap: 8 (ref 5–15)
BUN: 24 mg/dL — ABNORMAL HIGH (ref 8–23)
CO2: 26 mmol/L (ref 22–32)
Calcium: 9.2 mg/dL (ref 8.9–10.3)
Chloride: 101 mmol/L (ref 98–111)
Creatinine: 1.1 mg/dL (ref 0.61–1.24)
GFR, Est AFR Am: >60 mL/min (ref >60)
GFR, Estimated: >60 mL/min (ref >60)
Glucose, Bld: 102 mg/dL — ABNORMAL HIGH (ref 70–99)
Potassium: 3.9 mmol/L (ref 3.5–5.1)
Sodium: 135 mmol/L (ref 135–145)
Total Bilirubin: 0.5 mg/dL (ref 0.3–1.2)
Total Protein: 7.3 g/dL (ref 6.5–8.1)

## 2019-06-06 MED ORDER — CYANOCOBALAMIN 1000 MCG/ML IJ SOLN
1000.0000 ug | Freq: Once | INTRAMUSCULAR | Status: AC
  Administered 2019-06-06: 1000 ug via INTRAMUSCULAR

## 2019-06-06 MED ORDER — PROCHLORPERAZINE MALEATE 10 MG PO TABS
ORAL_TABLET | ORAL | Status: AC
  Filled 2019-06-06: qty 1

## 2019-06-06 MED ORDER — SODIUM CHLORIDE 0.9 % IV SOLN
480.0000 mg/m2 | Freq: Once | INTRAVENOUS | Status: AC
  Administered 2019-06-06: 1000 mg via INTRAVENOUS
  Filled 2019-06-06: qty 40

## 2019-06-06 MED ORDER — SODIUM CHLORIDE 0.9% FLUSH
10.0000 mL | Freq: Once | INTRAVENOUS | Status: AC
  Administered 2019-06-06: 10 mL
  Filled 2019-06-06: qty 10

## 2019-06-06 MED ORDER — CYANOCOBALAMIN 1000 MCG/ML IJ SOLN
INTRAMUSCULAR | Status: AC
  Filled 2019-06-06: qty 1

## 2019-06-06 MED ORDER — PROCHLORPERAZINE MALEATE 10 MG PO TABS
10.0000 mg | ORAL_TABLET | Freq: Once | ORAL | Status: AC
  Administered 2019-06-06: 10 mg via ORAL

## 2019-06-06 MED ORDER — SODIUM CHLORIDE 0.9 % IV SOLN
200.0000 mg | Freq: Once | INTRAVENOUS | Status: AC
  Administered 2019-06-06: 200 mg via INTRAVENOUS
  Filled 2019-06-06: qty 8

## 2019-06-06 MED ORDER — HEPARIN SOD (PORK) LOCK FLUSH 100 UNIT/ML IV SOLN
500.0000 [IU] | Freq: Once | INTRAVENOUS | Status: AC | PRN
  Administered 2019-06-06: 500 [IU]
  Filled 2019-06-06: qty 5

## 2019-06-06 MED ORDER — SODIUM CHLORIDE 0.9 % IV SOLN
Freq: Once | INTRAVENOUS | Status: AC
  Filled 2019-06-06: qty 250

## 2019-06-06 MED ORDER — SODIUM CHLORIDE 0.9% FLUSH
10.0000 mL | INTRAVENOUS | Status: DC | PRN
  Administered 2019-06-06: 10 mL
  Filled 2019-06-06: qty 10

## 2019-06-06 NOTE — Patient Instructions (Signed)
[name] Discharge Instructions for Patients Receiving Chemotherapy  Today you received the following chemotherapy agents :  Pembrolizumab,  Pemetrexed.  To help prevent nausea and vomiting after your treatment, we encourage you to take your nausea medication as prescribed.   If you develop nausea and vomiting that is not controlled by your nausea medication, call the clinic.   BELOW ARE SYMPTOMS THAT SHOULD BE REPORTED IMMEDIATELY:  *FEVER GREATER THAN 100.5 F  *CHILLS WITH OR WITHOUT FEVER  NAUSEA AND VOMITING THAT IS NOT CONTROLLED WITH YOUR NAUSEA MEDICATION  *UNUSUAL SHORTNESS OF BREATH  *UNUSUAL BRUISING OR BLEEDING  TENDERNESS IN MOUTH AND THROAT WITH OR WITHOUT PRESENCE OF ULCERS  *URINARY PROBLEMS  *BOWEL PROBLEMS  UNUSUAL RASH Items with * indicate a potential emergency and should be followed up as soon as possible.  Feel free to call the clinic should you have any questions or concerns. The clinic phone number is [phone].  Please show the [facility] at check-in to the Emergency Department and triage nurse.

## 2019-06-06 NOTE — Progress Notes (Signed)
[facility] [phone]   [phone]  OFFICE PROGRESS NOTE  [name], MD [address] [place] [address]  DIAGNOSIS:  1) Stage IV (T1c, N0, M1 C) non-small cell lung cancer, adenocarcinoma presented with left upper lobe lung nodule in addition to metastatic disease to the bone diagnosed in April 2020. 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   3) incidental finding of pulmonary embolism involving the right middle lobe pulmonary artery on CT scan on 12/18/2018.  Molecular studies by [employer] 956  STK11Splice Site SNV 3.8% Everolimus,Temsirolimus  [id number] 0.8% None (VUS), No (VUS)  NTRK3 L629L 1.5%  (Synonymous) No (Synonymous)  KRAS G12D 1.1% Binimetinib  CDKN2A H83Y 1.6% Abemaciclib, Palbociclib, Ribociclib  PDL1 TPS was 0%  PRIOR THERAPY: None  CURRENT THERAPY:  1) Systemic chemotherapy with carboplatin for AUC of 5, Alimta 500 mg/M2 and Keytruda 200 mg IV every 3 weeks.  First dose Oct 16, 2018.  Status post 11 cycles.  Starting from cycle #5 the patient will be on maintenance treatment with Alimta and Keytruda. 2) Xarelto initially 15 mg p.o. twice daily for 3 weeks followed by 20 mg p.o. daily.  He started the first dose of this treatment on 12/20/2018.  INTERVAL HISTORY: [name] 73y.o. male returns to the clinic today for follow-up visit.  The patient is feeling fine today with no concerning complaints except for mild fatigue.  He continues to tolerate his maintenance treatment with Alimta and Keytruda fairly well.  He denied having any chest pain, shortness of breath, cough or hemoptysis.  He denied having any fever or chills.  He has no nausea, vomiting, diarrhea or constipation.  He has no headache or visual changes.  The patient is here today for evaluation before starting cycle #12 of his treatment.  MEDICAL  HISTORY: Past Medical History:  Diagnosis Date  . [place]inal pain ([place]   . Anxiety    treated /w Xanax for mild depression , using 2 times per day, not PRN  . Asthma   . Benign prostatic hypertrophy   . CAD (coronary artery disease)    pt last left heart cath was in Nov 2008. EF was 55% on left ventriculogram. Circumflex was totally occluded after the 1st obtuse marginal with collaterals supplying the distal circumflex. LAD showed luminal irregularities. The stent in LAD was patent. The RCA showed luminal irregularities. The stent in th emid RCA and PDA were patent. There were no inverventions.   . Chest pain    from anxiety last time 1 month ago  . Chromophobe renal cell carcinoma ([place] 02/2018   s/p left kidney wedge resection  . Chronic obstructive pulmonary disease (HCC)    asthma  . Cluster headache    relative to sinus problems none recnt  . Constipation   . Depression   . DM2 (diabetes mellitus, type 2) ([place]    well with diet and exercise controlled  . GERD (gastroesophageal reflux disease)    hiatal hernia. Patient did have a Nissen fundoplication rare  . Heart murmur    heard 30 yrs ago  . History of blood transfusion    need for Bld. transfusion relative to taking NSAIDS  . HTN (hypertension)    pt. followed by [facility], last cardiac visit 2012, one yr. ago  . Hyperlipidemia   . Joint pain   . Lactose  intolerance   . Left kidney mass   . Low back pain    chronic  . Metastatic non-small cell lung cancer (HCC)    adenocarcinoma, LUL, bone metastasis in 09/2018  . Neuromuscular disorder ([place])    nerve involvement in back & upper back relative to hardware in neck   . Obesity   . OSA and COPD overlap syndrome ([place]) 02/25/2015   no cpap used pt denies sleep apnea  . Osteoarthritis   . Peptic ulcer disease   . Pneumonia not recent per pt  . 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 Sleep concern    states the study  (2003 )was done at [facility] it failed & he was told to return & he never did. Pt. states he has been found by prev. hosp. staff that he has to be told when to breathe & his wife does the same.   Marland Kitchen Spinal fracture    hx of traumatic in Jun 04, 2007 after falling off the roof  . Tinnitus, bilateral   . Vitamin D deficiency     ALLERGIES:  is allergic to codeine; meloxicam; morphine and related; nsaids; oxycodone; crestor [rosuvastatin]; cymbalta [duloxetine hcl]; dilaudid [hydromorphone hcl]; effexor [venlafaxine]; gluten meal; topamax [topiramate]; tramadol hcl; trazodone and nefazodone; adhesive [tape]; penicillins; and sulfa antibiotics.  MEDICATIONS:  Current Outpatient Medications  Medication Sig Dispense Refill  . acetaminophen (TYLENOL) 500 MG tablet Take 1 tablet (500 mg total) by mouth every 6 (six) hours as needed. 30 tablet 0  . albuterol (VENTOLIN HFA) 108 (90 Base) MCG/ACT inhaler Inhale 2 puffs into the lungs every 4 (four) hours as needed for wheezing or shortness of breath. 18 g 1  . Alpha-Lipoic Acid 200 MG CAPS Take by mouth 2 (two) times daily.    . Ascorbic Acid (VITAMIN C) 1000 MG tablet Take 1,000 mg by mouth 2 (two) times daily.    . bisoprolol-hydrochlorothiazide (ZIAC) 5-6.25 MG tablet Take 1 tablet Daily for BP (Patient not taking: Reported on 06/03/2019) 90 tablet 3  . Blood Glucose Monitoring Suppl (FREESTYLE FREEDOM LITE) w/Device KIT Check blood sugar 1 time a day. [id number] 1 each 0  . budesonide-formoterol (SYMBICORT) 160-4.5 MCG/ACT inhaler Use twice daily, wash your mouth afterwards to avoid yeast. 1 Inhaler 12  . Cholecalciferol (VITAMIN D3) 5000 units CAPS Take 5,000 Units by mouth 2 (two) times daily.    . CHOLINE PO Take 600 mg by mouth daily.    Marland Kitchen CINNAMON PO Take by mouth 2 (two) times daily.    . diazepam (VALIUM) 5 MG tablet Take 1/2 to 1 tablet 2 to 3 x /day as needed for Anxiety 270 tablet 0  . DOXYLAMINE SUCCINATE PO Take 0.5 tablets by mouth at  bedtime.     . folic acid (FOLVITE) 1 MG tablet Take 1 tablet (1 mg total) by mouth daily. 30 tablet 4  . Icosapent Ethyl (VASCEPA) 1 g CAPS Take 2 capsules (2 g total) by mouth 2 (two) times a day. 120 capsule 11  . ipratropium (ATROVENT) 0.03 % nasal spray Place 1-2 sprays into both nostrils daily as needed (for allergies.). 30 mL 3  . lidocaine-prilocaine (EMLA) cream Apply 1 application topically as needed. 30 g 0  . magnesium oxide (MAG-OX) 400 MG tablet Take 1,200 mg by mouth as needed.    Marland Kitchen MELATONIN PO Take 30 mg by mouth at bedtime.    Marland Kitchen  Multiple Vitamins-Minerals (MULTIVITAMIN ADULT PO) Take by mouth daily.    . nitroGLYCERIN (NITROSTAT) 0.4 MG SL tablet Dissolve 1 tablet under tongue every 3 to 5 minutes if needed for Chest Pain 50 tablet 3  . OVER THE COUNTER MEDICATION Taking OTC Iron 1 tablet daily.    . prochlorperazine (COMPAZINE) 10 MG tablet Take 1 tablet (10 mg total) by mouth every 6 (six) hours as needed for nausea or vomiting. 30 tablet 0  . rosuvastatin (CRESTOR) 5 MG tablet Take 1 tablet (5 mg total) by mouth 2 (two) times a week. Take one tablet by mouth once a week for a few weeks, increase to twice weekly as tolerated 24 tablet 3  . SUPER B COMPLEX/C PO Take by mouth daily.    . tamsulosin (FLOMAX) 0.4 MG CAPS capsule Take 1 capsule Daily for for Prostate 90 capsule 3  . tetrahydrozoline-zinc (VISINE-AC) 0.05-0.25 % ophthalmic solution Place 1-2 drops into both eyes 3 (three) times daily as needed (for redness/irritation.).    Marland Kitchen triamcinolone (NASACORT) 55 MCG/ACT AERO nasal inhaler Place 2 sprays into the nose at bedtime. 1 Inhaler 3  . Triamcinolone Acetonide (NASAL ALLERGY 24 HOUR NA) SMARTSIG:2 Spray(s) Both Nares Every Night    . triamcinolone ointment (KENALOG) 0.1 % Apply 1 application topically 2 (two) times daily. 80 g 1  . Turmeric 500 MG TABS Take by mouth. Takes 3 in the morning and 3 in the evening    . VALERIAN PO Take 400 mg by mouth daily.    Alveda Reasons  20 MG TABS tablet TAKE 1 TABLET BY MOUTH EVERY DAY WITH SUPPER 30 tablet 2  . Zinc 50 MG TABS Take by mouth daily.     No current facility-administered medications for this visit.    SURGICAL HISTORY:  Past Surgical History:  Procedure Laterality Date  . BACK SURGERY     x3- last surgery lumbar- 1998- / fusion   . blepheroplasty     both eyesx2  . C5-T6 posterior fusion     with [place]asis and radius screws  . [place], 2008 & 2012 multiple stents  total 4 times  . cataracts     cataracts removed- /w IOL - both eyes   . CERVICAL SPINE SURGERY    . COLONOSCOPY    . ESOPHAGEAL MANOMETRY    . HARDWARE REMOVAL  02/20/2012   Procedure: HARDWARE REMOVAL;  Surgeon: [name], MD;  Location: [facility];  Service: Neurosurgery;  Laterality: N/A;  Hardware Removal  . HIATAL HERNIA REPAIR  1979, spring 2009  . IR IMAGING GUIDED PORT INSERTION  10/29/2018  . LAPAROSCOPIC CHOLECYSTECTOMY     & IOC  . multiple percutaneous coronary interventions    . NASAL SINUS SURGERY     x2, sees Dr. [name], still having problems, states he uses Benadryl PRN- up to 5 times per day   . right temporal artery biopsy    . right wrist plate insertion  [id number]/[id number]  . ROBOTIC ASSITED PARTIAL NEPHRECTOMY Left 03/02/2018   Procedure: XI ROBOTIC ASSITED LEFT PARTIAL NEPHRECTOMY WITH LYSIS OF ADHESIONS X30 MINUTES.;  Surgeon: Ardis Hughs, MD;  Location: [facility]S;  Service: Urology;  Laterality: Left;  CLAMP TIME FOR KIDNEY 1053-1110 TOTAL 18MINUTES  . Rotator cuff surgery Right   . SKIN BIOPSY Right 10/31/2017   Chest  . TRANSURETHRAL RESECTION OF BLADDER TUMOR N/A 11/03/2017   Procedure: cystoscopy;  Surgeon: Kathie Rhodes, MD;  Location:  [facility] ORS;  Service: Urology;  Laterality: N/A;  . UMBILICAL HERNIA REPAIR    . UPPER GI ENDOSCOPY    . VENTRAL HERNIA REPAIR N/A 03/02/2018   Procedure: LAPAROSCOPIC REPAIR OF INCARCERATED INCISIONAL HERNIA WITH LYSIS OF ADHESIONS;  Surgeon: Michael Boston, MD;   Location: [facility]S;  Service: General;  Laterality: N/A;    REVIEW OF SYSTEMS:  A comprehensive review of systems was negative except for: Constitutional: positive for fatigue   PHYSICAL EXAMINATION: General appearance: alert, cooperative, fatigued and no distress Head: Normocephalic, without obvious abnormality, atraumatic Neck: no adenopathy, no JVD, supple, symmetrical, trachea midline and thyroid not enlarged, symmetric, no tenderness/mass/nodules Lymph nodes: Cervical, supraclavicular, and axillary nodes normal. Resp: clear to auscultation bilaterally Back: symmetric, no curvature. ROM normal. No CVA tenderness. Cardio: regular rate and rhythm, S1, S2 normal, no murmur, click, rub or gallop GI: soft, non-tender; bowel sounds normal; no masses,  no organomegaly Extremities: extremities normal, atraumatic, no cyanosis or edema  ECOG PERFORMANCE STATUS: 1 - Symptomatic but completely ambulatory  Blood pressure 134/75, pulse 91, temperature 98.2 F (36.8 C), temperature source Temporal, resp. rate 18, height 5' 6.75" (1.695 m), weight 204 lb 12.8 oz (92.9 kg), SpO2 98 %.  LABORATORY DATA: Lab Results  Component Value Date   WBC 4.2 06/06/2019   HGB 13.2 06/06/2019   HCT 38.9 (L) 06/06/2019   MCV 96.5 06/06/2019   PLT 188 06/06/2019      Chemistry      Component Value Date/Time   NA 133 (L) 05/16/2019 0922   K 3.9 05/16/2019 0922   CL 100 05/16/2019 0922   CO2 26 05/16/2019 0922   BUN 25 (H) 05/16/2019 0922   CREATININE 0.99 05/16/2019 0922   CREATININE 1.10 11/28/2018 0959      Component Value Date/Time   CALCIUM 8.9 05/16/2019 0922   ALKPHOS 79 05/16/2019 0922   AST 35 05/16/2019 0922   ALT 27 05/16/2019 0922   BILITOT 0.4 05/16/2019 0922       RADIOGRAPHIC STUDIES: No results found.  ASSESSMENT AND PLAN: This is a very pleasant 73 years old white male recently diagnosed with metastatic non-small cell lung cancer, adenocarcinoma presented with left upper lobe  lung nodule in addition to metastatic bone disease diagnosed in April 2020. The molecular studies showed no actionable mutations and PDL 1 expression was negative. The patient also has a history of renal cell carcinoma status post wedge resection of the left kidney in September 2019. The patient is currently undergoing systemic chemotherapy with carboplatin for AUC of 5, Alimta 500 mg/M2 and Keytruda 200 mg IV every 3 weeks status post 11 cycles.  Starting from cycle #5 the patient will be treated with maintenance Alimta and Keytruda every 3 weeks. The patient continues to tolerate this treatment well with no concerning adverse effects. I recommended for him to proceed with cycle #12 today as planned. I will see him back for follow-up visit in 3 weeks for evaluation with repeat CT scan of the chest, abdomen and pelvis for restaging of his disease. For the incidental finding of pulmonary embolism of the right middle lobe pulmonary artery, he is currently on Xarelto 20 mg p.o. daily.  He will continue with the same treatment for now. He was advised to call immediately if he has any concerning symptoms in the interval. The patient voices understanding of current disease status and treatment options and is in agreement with the current care plan. All questions were answered. The patient knows  to call the clinic with any problems, questions or concerns. We can certainly see the patient much sooner if necessary.  Disclaimer: This note was dictated with voice recognition software. Similar sounding words can inadvertently be transcribed and may not be corrected upon review.

## 2019-06-11 ENCOUNTER — Telehealth: Payer: Self-pay | Admitting: Internal Medicine

## 2019-06-11 NOTE — Telephone Encounter (Signed)
Scheduled per los. Mailed printout  °

## 2019-06-25 ENCOUNTER — Other Ambulatory Visit: Payer: Self-pay | Admitting: Internal Medicine

## 2019-06-25 ENCOUNTER — Other Ambulatory Visit: Payer: Self-pay

## 2019-06-25 ENCOUNTER — Encounter (HOSPITAL_COMMUNITY): Payer: Self-pay

## 2019-06-25 ENCOUNTER — Ambulatory Visit (HOSPITAL_COMMUNITY)
Admission: RE | Admit: 2019-06-25 | Discharge: 2019-06-25 | Disposition: A | Discharge status: Home / Self Care | Payer: Medicare Other | Source: Ambulatory Visit | Attending: Internal Medicine | Admitting: Internal Medicine

## 2019-06-25 DIAGNOSIS — C3490 Malignant neoplasm of unspecified part of unspecified bronchus or lung: Secondary | ICD-10-CM | POA: Diagnosis not present

## 2019-06-25 MED ORDER — HEPARIN SOD (PORK) LOCK FLUSH 100 UNIT/ML IV SOLN: 500.0000 [IU] | Freq: Once | INTRAVENOUS | Status: DC

## 2019-06-25 MED ORDER — IOHEXOL 300 MG/ML  SOLN
100.0000 mL | Freq: Once | INTRAMUSCULAR | Status: AC | PRN
  Administered 2019-06-25: 100 mL via INTRAVENOUS

## 2019-06-25 MED ORDER — HEPARIN SOD (PORK) LOCK FLUSH 100 UNIT/ML IV SOLN
INTRAVENOUS | Status: AC
  Administered 2019-06-25: 500 [IU] via INTRAVENOUS
  Filled 2019-06-25: qty 5

## 2019-06-25 MED ORDER — SODIUM CHLORIDE (PF) 0.9 % IJ SOLN
INTRAMUSCULAR | Status: AC
  Filled 2019-06-25: qty 50

## 2019-06-27 ENCOUNTER — Inpatient Hospital Stay: Payer: Medicare Other

## 2019-06-27 ENCOUNTER — Other Ambulatory Visit: Payer: Self-pay

## 2019-06-27 ENCOUNTER — Encounter: Payer: Self-pay | Admitting: Internal Medicine

## 2019-06-27 ENCOUNTER — Inpatient Hospital Stay: Payer: Medicare Other | Attending: Internal Medicine | Admitting: Internal Medicine

## 2019-06-27 VITALS — BP 135/80 | HR 91 | Temp 98.2°F | Resp 18 | Wt 204.5 lb

## 2019-06-27 DIAGNOSIS — C3412 Malignant neoplasm of upper lobe, left bronchus or lung: Secondary | ICD-10-CM | POA: Insufficient documentation

## 2019-06-27 DIAGNOSIS — C7951 Secondary malignant neoplasm of bone: Secondary | ICD-10-CM | POA: Insufficient documentation

## 2019-06-27 DIAGNOSIS — R5382 Chronic fatigue, unspecified: Secondary | ICD-10-CM

## 2019-06-27 DIAGNOSIS — C3492 Malignant neoplasm of unspecified part of left bronchus or lung: Secondary | ICD-10-CM

## 2019-06-27 DIAGNOSIS — Z5111 Encounter for antineoplastic chemotherapy: Secondary | ICD-10-CM | POA: Insufficient documentation

## 2019-06-27 DIAGNOSIS — Z5112 Encounter for antineoplastic immunotherapy: Secondary | ICD-10-CM | POA: Insufficient documentation

## 2019-06-27 DIAGNOSIS — I2699 Other pulmonary embolism without acute cor pulmonale: Secondary | ICD-10-CM

## 2019-06-27 DIAGNOSIS — Z79899 Other long term (current) drug therapy: Secondary | ICD-10-CM | POA: Diagnosis not present

## 2019-06-27 DIAGNOSIS — I10 Essential (primary) hypertension: Secondary | ICD-10-CM | POA: Diagnosis not present

## 2019-06-27 DIAGNOSIS — Z95828 Presence of other vascular implants and grafts: Secondary | ICD-10-CM

## 2019-06-27 LAB — CMP (CANCER CENTER ONLY)
ALT: 23 U/L (ref 0–44)
AST: 28 U/L (ref 15–41)
Albumin: 3.3 g/dL — ABNORMAL LOW (ref 3.5–5.0)
Alkaline Phosphatase: 94 U/L (ref 38–126)
Anion gap: 9 (ref 5–15)
BUN: 30 mg/dL — ABNORMAL HIGH (ref 8–23)
CO2: 25 mmol/L (ref 22–32)
Calcium: 8.8 mg/dL — ABNORMAL LOW (ref 8.9–10.3)
Chloride: 102 mmol/L (ref 98–111)
Creatinine: 1.15 mg/dL (ref 0.61–1.24)
GFR, Est AFR Am: >60 mL/min (ref >60)
GFR, Estimated: >60 mL/min (ref >60)
Glucose, Bld: 100 mg/dL — ABNORMAL HIGH (ref 70–99)
Potassium: 4.2 mmol/L (ref 3.5–5.1)
Sodium: 136 mmol/L (ref 135–145)
Total Bilirubin: 0.5 mg/dL (ref 0.3–1.2)
Total Protein: 7.2 g/dL (ref 6.5–8.1)

## 2019-06-27 LAB — CBC WITH DIFFERENTIAL (CANCER CENTER ONLY)
Abs Immature Granulocytes: 0.01 10*3/uL (ref 0.00–0.07)
Basophils Absolute: 0 10*3/uL (ref 0.0–0.1)
Basophils Relative: 0 %
Eosinophils Absolute: 0.1 10*3/uL (ref 0.0–0.5)
Eosinophils Relative: 3 %
HCT: 39.7 % (ref 39.0–52.0)
Hemoglobin: 13.3 g/dL (ref 13.0–17.0)
Immature Granulocytes: 0 %
Lymphocytes Relative: 28 %
Lymphs Abs: 1.3 10*3/uL (ref 0.7–4.0)
MCH: 32.8 pg (ref 26.0–34.0)
MCHC: 33.5 g/dL (ref 30.0–36.0)
MCV: 98 fL (ref 80.0–100.0)
Monocytes Absolute: 0.5 10*3/uL (ref 0.1–1.0)
Monocytes Relative: 11 %
Neutro Abs: 2.6 10*3/uL (ref 1.7–7.7)
Neutrophils Relative %: 58 %
Platelet Count: 147 10*3/uL — ABNORMAL LOW (ref 150–400)
RBC: 4.05 MIL/uL — ABNORMAL LOW (ref 4.22–5.81)
RDW: 14.8 % (ref 11.5–15.5)
WBC Count: 4.5 10*3/uL (ref 4.0–10.5)
nRBC: 0 % (ref 0.0–0.2)

## 2019-06-27 LAB — TSH: TSH: 1.057 u[IU]/mL (ref 0.320–4.118)

## 2019-06-27 MED ORDER — SODIUM CHLORIDE 0.9 % IV SOLN
Freq: Once | INTRAVENOUS | Status: AC
  Filled 2019-06-27: qty 250

## 2019-06-27 MED ORDER — PROCHLORPERAZINE MALEATE 10 MG PO TABS
ORAL_TABLET | ORAL | Status: AC
  Filled 2019-06-27: qty 1

## 2019-06-27 MED ORDER — SODIUM CHLORIDE 0.9 % IV SOLN
500.0000 mg/m2 | Freq: Once | INTRAVENOUS | Status: AC
  Administered 2019-06-27: 14:00:00 1000 mg via INTRAVENOUS
  Filled 2019-06-27: qty 40

## 2019-06-27 MED ORDER — SODIUM CHLORIDE 0.9% FLUSH
10.0000 mL | INTRAVENOUS | Status: DC | PRN
  Administered 2019-06-27: 14:00:00 10 mL
  Filled 2019-06-27: qty 10

## 2019-06-27 MED ORDER — SODIUM CHLORIDE 0.9% FLUSH
10.0000 mL | Freq: Once | INTRAVENOUS | Status: AC
  Administered 2019-06-27: 10 mL
  Filled 2019-06-27: qty 10

## 2019-06-27 MED ORDER — SODIUM CHLORIDE 0.9 % IV SOLN
200.0000 mg | Freq: Once | INTRAVENOUS | Status: AC
  Administered 2019-06-27: 13:00:00 200 mg via INTRAVENOUS
  Filled 2019-06-27: qty 8

## 2019-06-27 MED ORDER — PROCHLORPERAZINE MALEATE 10 MG PO TABS
10.0000 mg | ORAL_TABLET | Freq: Once | ORAL | Status: AC
  Administered 2019-06-27: 13:00:00 10 mg via ORAL

## 2019-06-27 MED ORDER — HEPARIN SOD (PORK) LOCK FLUSH 100 UNIT/ML IV SOLN
500.0000 [IU] | Freq: Once | INTRAVENOUS | Status: AC | PRN
  Administered 2019-06-27: 500 [IU]
  Filled 2019-06-27: qty 5

## 2019-06-27 NOTE — Patient Instructions (Signed)

## 2019-06-27 NOTE — Patient Instructions (Signed)
[facility] Discharge Instructions for Patients Receiving Chemotherapy  Today you received the following chemotherapy agents Keytruda and Alimta  To help prevent nausea and vomiting after your treatment, we encourage you to take your nausea medication as directed.    If you develop nausea and vomiting that is not controlled by your nausea medication, call the clinic.   BELOW ARE SYMPTOMS THAT SHOULD BE REPORTED IMMEDIATELY:  *FEVER GREATER THAN 100.5 F  *CHILLS WITH OR WITHOUT FEVER  NAUSEA AND VOMITING THAT IS NOT CONTROLLED WITH YOUR NAUSEA MEDICATION  *UNUSUAL SHORTNESS OF BREATH  *UNUSUAL BRUISING OR BLEEDING  TENDERNESS IN MOUTH AND THROAT WITH OR WITHOUT PRESENCE OF ULCERS  *URINARY PROBLEMS  *BOWEL PROBLEMS  UNUSUAL RASH Items with * indicate a potential emergency and should be followed up as soon as possible.  Feel free to call the clinic should you have any questions or concerns. The clinic phone number is [phone].  Please show the [facility] at check-in to the Emergency Department and triage nurse.

## 2019-06-27 NOTE — Progress Notes (Signed)
[facility] Telephone:([phone]) [phone]   [phone]  OFFICE PROGRESS NOTE  [name], MD [address] [place] [address]  DIAGNOSIS:  1) Stage IV (T1c, N0, M1 C) non-small cell lung cancer, adenocarcinoma presented with left upper lobe lung nodule in addition to metastatic disease to the bone diagnosed in April 2020. 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   3) incidental finding of pulmonary embolism involving the right middle lobe pulmonary artery on CT scan on 12/18/2018.  Molecular studies by [facility] 277  STK11Splice Site SNV 8.2% Everolimus,Temsirolimus  [id number] 0.8% None (VUS), No (VUS)  NTRK3 L629L 1.5%  (Synonymous) No (Synonymous)  KRAS G12D 1.1% Binimetinib  CDKN2A H83Y 1.6% Abemaciclib, Palbociclib, Ribociclib  PDL1 TPS was 0%  PRIOR THERAPY: None  CURRENT THERAPY:  1) Systemic chemotherapy with carboplatin for AUC of 5, Alimta 500 mg/M2 and Keytruda 200 mg IV every 3 weeks.  First dose Oct 16, 2018.  Status post 12 cycles.  Starting from cycle #5 the patient will be on maintenance treatment with Alimta and Keytruda. 2) Xarelto initially 15 mg p.o. twice daily for 3 weeks followed by 20 mg p.o. daily.  He started the first dose of this treatment on 12/20/2018.  INTERVAL HISTORY: KKaylen NghiemSouthern 74y.o. male returns to the clinic today for follow-up visit.  His wife was available by phone during the visit.  The patient is feeling fine today with no concerning complaints except for fatigue.  He has mild swelling of the ankle but no significant pain in the legs.  He denied having any current chest pain, shortness of breath except with exertion with no cough or hemoptysis.  He denied having any fever or chills.  He has no nausea, vomiting, diarrhea or constipation.  He denied having any headache or visual changes.  The patient has been  tolerating his treatment with Alimta and Keytruda fairly well.  He had repeat CT scan of the chest, abdomen pelvis performed recently and he is here for evaluation and discussion of his scan results.   MEDICAL HISTORY: Past Medical History:  Diagnosis Date  . [place]inal pain ([place]   . Anxiety    treated /w Xanax for mild depression , using 2 times per day, not PRN  . Asthma   . Benign prostatic hypertrophy   . CAD (coronary artery disease)    pt last left heart cath was in Nov 2008. EF was 55% on left ventriculogram. Circumflex was totally occluded after the 1st obtuse marginal with collaterals supplying the distal circumflex. LAD showed luminal irregularities. The stent in LAD was patent. The RCA showed luminal irregularities. The stent in th emid RCA and PDA were patent. There were no inverventions.   . Chest pain    from anxiety last time 1 month ago  . Chromophobe renal cell carcinoma ([place] 02/2018   s/p left kidney wedge resection  . Chronic obstructive pulmonary disease (HCC)    asthma  . Cluster headache    relative to sinus problems none recnt  . Constipation   . Depression   . DM2 (diabetes mellitus, type 2) ([place]    well with diet and exercise controlled  . GERD (gastroesophageal reflux disease)    hiatal hernia. Patient did have a Nissen fundoplication rare  . Heart murmur    heard 30 yrs ago  . History of blood transfusion  need for Bld. transfusion relative to taking NSAIDS  . HTN (hypertension)    pt. followed by [facility], last cardiac visit 2012, one yr. ago  . Hyperlipidemia   . Joint pain   . Lactose intolerance   . Left kidney mass   . Low back pain    chronic  . Metastatic non-small cell lung cancer (HCC)    adenocarcinoma, LUL, bone metastasis in 09/2018  . Neuromuscular disorder ([place])    nerve involvement in back & upper back relative to hardware in neck   . Obesity   . OSA and COPD overlap syndrome ([place]) 02/25/2015   no cpap used pt denies sleep  apnea  . Osteoarthritis   . Peptic ulcer disease   . Pneumonia not recent per pt  . 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 Sleep concern    states the study (2003 )was done at [facility]., it failed & he was told to return & he never did. Pt. states he has been found by prev. hosp. staff that he has to be told when to breathe & his wife does the same.   [name] Spinal fracture    hx of traumatic in Jun 04, 2007 after falling off the roof  . Tinnitus, bilateral   . Vitamin D deficiency     ALLERGIES:  is allergic to codeine; meloxicam; morphine and related; nsaids; oxycodone; crestor [rosuvastatin]; cymbalta [duloxetine hcl]; dilaudid [hydromorphone hcl]; effexor [venlafaxine]; gluten meal; topamax [topiramate]; tramadol hcl; trazodone and nefazodone; adhesive [tape]; penicillins; and sulfa antibiotics.  MEDICATIONS:  Current Outpatient Medications  Medication Sig Dispense Refill  . acetaminophen (TYLENOL) 500 MG tablet Take 1 tablet (500 mg total) by mouth every 6 (six) hours as needed. 30 tablet 0  . albuterol (VENTOLIN HFA) 108 (90 Base) MCG/ACT inhaler Inhale 2 puffs into the lungs every 4 (four) hours as needed for wheezing or shortness of breath. 18 g 1  . Alpha-Lipoic Acid 200 MG CAPS Take by mouth 2 (two) times daily.    . Ascorbic Acid (VITAMIN C) 1000 MG tablet Take 1,000 mg by mouth 2 (two) times daily.    . bisoprolol-hydrochlorothiazide (ZIAC) 5-6.25 MG tablet Take 1 tablet Daily for BP 90 tablet 3  . Blood Glucose Monitoring Suppl (FREESTYLE FREEDOM LITE) w/Device KIT Check blood sugar 1 time a day. DX-E11.21 1 each 0  . budesonide-formoterol (SYMBICORT) 160-4.5 MCG/ACT inhaler Use twice daily, wash your mouth afterwards to avoid yeast. 1 Inhaler 12  . Cholecalciferol (VITAMIN D3) 5000 units CAPS Take 5,000 Units by mouth 2 (two) times daily.    . CHOLINE PO Take 600 mg by mouth daily.    [name] CINNAMON PO Take by mouth 2 (two) times  daily.    . diazepam (VALIUM) 5 MG tablet Take 1/2 to 1 tablet 2 to 3 x /day as needed for Anxiety 270 tablet 0  . DOXYLAMINE SUCCINATE PO Take 0.5 tablets by mouth at bedtime.     . folic acid (FOLVITE) 1 MG tablet Take 1 tablet (1 mg total) by mouth daily. 30 tablet 4  . Icosapent Ethyl (VASCEPA) 1 g CAPS Take 2 capsules (2 g total) by mouth 2 (two) times a day. 120 capsule 11  . ipratropium (ATROVENT) 0.03 % nasal spray Place 1-2 sprays into both nostrils daily as needed (for allergies.). 30 mL 3  . lidocaine-prilocaine (EMLA) cream Apply 1 application topically as needed. [place]  g 0  . magnesium oxide (MAG-OX) 400 MG tablet Take 1,200 mg by mouth as needed.    [name] MELATONIN PO Take 30 mg by mouth at bedtime.    . Multiple Vitamins-Minerals (MULTIVITAMIN ADULT PO) Take by mouth daily.    . nitroGLYCERIN (NITROSTAT) 0.4 MG SL tablet Dissolve 1 tablet under tongue every 3 to 5 minutes if needed for Chest Pain 50 tablet 3  . OVER THE COUNTER MEDICATION Taking OTC Iron 1 tablet daily.    . prochlorperazine (COMPAZINE) 10 MG tablet Take 1 tablet (10 mg total) by mouth every 6 (six) hours as needed for nausea or vomiting. 30 tablet 0  . rosuvastatin (CRESTOR) 5 MG tablet Take 1 tablet (5 mg total) by mouth 2 (two) times a week. Take one tablet by mouth once a week for a few weeks, increase to twice weekly as tolerated 24 tablet 3  . SUPER B COMPLEX/C PO Take by mouth daily.    . tamsulosin (FLOMAX) 0.4 MG CAPS capsule Take 1 capsule Daily for for Prostate 90 capsule 3  . tetrahydrozoline-zinc (VISINE-AC) 0.05-0.25 % ophthalmic solution Place 1-2 drops into both eyes 3 (three) times daily as needed (for redness/irritation.).    [name] triamcinolone (NASACORT) 55 MCG/ACT AERO nasal inhaler Place 2 sprays into the nose at bedtime. 1 Inhaler 3  . Triamcinolone Acetonide (NASAL ALLERGY 24 HOUR NA) SMARTSIG:2 Spray(s) Both Nares Every Night    . triamcinolone ointment (KENALOG) 0.1 % Apply 1 application topically 2  (two) times daily. 80 g 1  . Turmeric 500 MG TABS Take by mouth. Takes 3 in the morning and 3 in the evening    . XARELTO 20 MG TABS tablet TAKE 1 TABLET BY MOUTH EVERY DAY WITH SUPPER 30 tablet 2  . Zinc 50 MG TABS Take by mouth daily.     No current facility-administered medications for this visit.    SURGICAL HISTORY:  Past Surgical History:  Procedure Laterality Date  . BACK SURGERY     x3- last surgery lumbar- 1998- / fusion   . blepheroplasty     both eyesx2  . C5-T6 posterior fusion     with [place] and radius screws  . [place], 2008 & 2012 multiple stents  total 4 times  . cataracts     cataracts removed- /w IOL - both eyes   . CERVICAL SPINE SURGERY    . COLONOSCOPY    . ESOPHAGEAL MANOMETRY    . HARDWARE REMOVAL  02/20/2012   Procedure: HARDWARE REMOVAL;  Surgeon: [name], MD;  Location: [facility];  Service: Neurosurgery;  Laterality: N/A;  Hardware Removal  . HIATAL HERNIA REPAIR  1979, spring 2009  . IR IMAGING GUIDED PORT INSERTION  10/29/2018  . LAPAROSCOPIC CHOLECYSTECTOMY     & IOC  . multiple percutaneous coronary interventions    . NASAL SINUS SURGERY     x2, sees Dr. [name], still having problems, states he uses Benadryl PRN- up to 5 times per day   . right temporal artery biopsy    . right wrist plate insertion  [id number]  . ROBOTIC ASSITED PARTIAL NEPHRECTOMY Left 03/02/2018   Procedure: XI ROBOTIC ASSITED LEFT PARTIAL NEPHRECTOMY WITH LYSIS OF ADHESIONS X30 MINUTES.;  Surgeon: Ardis Hughs, MD;  Location: [facility];  Service: Urology;  Laterality: Left;  CLAMP TIME FOR KIDNEY 1053-1110 TOTAL 18MINUTES  . Rotator cuff surgery Right   . SKIN BIOPSY Right 10/31/2017  Chest  . TRANSURETHRAL RESECTION OF BLADDER TUMOR N/A 11/03/2017   Procedure: cystoscopy;  Surgeon: Kathie Rhodes, MD;  Location: [facility];  Service: Urology;  Laterality: N/A;  . UMBILICAL HERNIA REPAIR    . UPPER GI ENDOSCOPY    . VENTRAL HERNIA REPAIR N/A 03/02/2018    Procedure: LAPAROSCOPIC REPAIR OF INCARCERATED INCISIONAL HERNIA WITH LYSIS OF ADHESIONS;  Surgeon: Michael Boston, MD;  Location: [facility];  Service: General;  Laterality: N/A;    REVIEW OF SYSTEMS:  Constitutional: positive for fatigue Eyes: negative Ears, nose, mouth, throat, and face: negative Respiratory: positive for dyspnea on exertion Cardiovascular: negative Gastrointestinal: negative Genitourinary:negative Integument/breast: negative Hematologic/lymphatic: negative Musculoskeletal:negative Neurological: negative Behavioral/Psych: negative Endocrine: negative Allergic/Immunologic: negative   PHYSICAL EXAMINATION: General appearance: alert, cooperative, fatigued and no distress Head: Normocephalic, without obvious abnormality, atraumatic Neck: no adenopathy, no JVD, supple, symmetrical, trachea midline and thyroid not enlarged, symmetric, no tenderness/mass/nodules Lymph nodes: Cervical, supraclavicular, and axillary nodes normal. Resp: clear to auscultation bilaterally Back: symmetric, no curvature. ROM normal. No CVA tenderness. Cardio: regular rate and rhythm, S1, S2 normal, no murmur, click, rub or gallop GI: soft, non-tender; bowel sounds normal; no masses,  no organomegaly Extremities: extremities normal, atraumatic, no cyanosis or edema Neurologic: Alert and oriented X 3, normal strength and tone. Normal symmetric reflexes. Normal coordination and gait  ECOG PERFORMANCE STATUS: 1 - Symptomatic but completely ambulatory  Blood pressure 135/80, pulse 91, temperature 98.2 F (36.8 C), temperature source Temporal, resp. rate 18, weight 204 lb 8 oz (92.8 kg), SpO2 99 %.  LABORATORY DATA: Lab Results  Component Value Date   WBC 4.5 06/27/2019   HGB 13.3 06/27/2019   HCT 39.7 06/27/2019   MCV 98.0 06/27/2019   PLT 147 (L) 06/27/2019      Chemistry      Component Value Date/Time   NA 136 06/27/2019 1051   K 4.2 06/27/2019 1051   CL 102 06/27/2019 1051   CO2 25  06/27/2019 1051   BUN 30 (H) 06/27/2019 1051   CREATININE 1.15 06/27/2019 1051   CREATININE 1.10 11/28/2018 0959      Component Value Date/Time   CALCIUM 8.8 (L) 06/27/2019 1051   ALKPHOS 94 06/27/2019 1051   AST 28 06/27/2019 1051   ALT 23 06/27/2019 1051   BILITOT 0.5 06/27/2019 1051       RADIOGRAPHIC STUDIES: CT ABDOMEN PELVIS W WO CONTRAST  Result Date: 06/25/2019 CLINICAL DATA:  Non-small cell lung cancer, restaging EXAM: CT CHEST WITH CONTRAST CT ABDOMEN AND PELVIS WITH AND WITHOUT CONTRAST TECHNIQUE: Multidetector CT imaging of the chest was performed during intravenous contrast administration. Multidetector CT imaging of the abdomen and pelvis was performed following the standard protocol before and during bolus administration of intravenous contrast. CONTRAST:  148m OMNIPAQUE IOHEXOL 300 MG/ML  SOLN COMPARISON:  04/25/2019 FINDINGS: CT CHEST FINDINGS Cardiovascular: There is asymmetric low-attenuation within the right subclavian vein, image 26/11. The heart size appears within normal limits. Aortic atherosclerosis. Lad, left circumflex and RCA coronary artery calcifications. No pericardial effusion. Mediastinum/Nodes: Normal appearance of the thyroid gland. The trachea appears patent and is midline. Normal appearance of the esophagus. No enlarged mediastinal or hilar lymph nodes. Lungs/Pleura: No pleural effusion identified. Spiculated nodule within the anterior left upper lobe measures 1.3 cm, image 50/15. Unchanged. No new pulmonary nodule or mass identified. Musculoskeletal: Postop change identified within the cervical spine from previous posterior hardware fusion. Wedge deformities involving the upper thoracic spine, unchanged. Chronic healed fracture involving the proximal body  of sternum. Sclerotic lesion involving the left glenoid is stable from previous study. CT ABDOMEN AND PELVIS FINDINGS Hepatobiliary: No focal liver abnormality is seen. Status post cholecystectomy. No biliary  dilatation. Pancreas: Unremarkable. No pancreatic ductal dilatation or surrounding inflammatory changes. Spleen: Normal in size without focal abnormality. Adrenals/Urinary Tract: Normal adrenal glands. Unremarkable appearance of the right kidney. Postoperative changes around the upper pole of left kidney are again identified and appear unchanged. 9 mm intermediate attenuating structure arising from posterior cortex of the left kidney is similar to previous exam. This is too small to reliably characterize. Nonobstructing stone is noted within the inferior pole collecting system of the left kidney measuring 4 mm. Unremarkable appearance of the urinary bladder. Stomach/Bowel: Stomach is within normal limits. Appendix not confidently identified on today's exam. No evidence of bowel wall thickening, distention, or inflammatory changes. Vascular/Lymphatic: Aortic atherosclerosis. Ectasias of the infrarenal abdominal aorta measures 3 cm. No abdominal no pelvic adenopathy. Reproductive: Mild prostate gland enlargement. Other: No free fluid or fluid collection Musculoskeletal: Extensive postop change identified from posterior hardware fusion at L3 through L5. Bilateral iliac bones sclerotic lesions are stable from previous exam. Within the left iliac bone there is a slightly expansile mixed lucent and sclerotic lesion measuring 3.8 cm, unchanged from previous exam. IMPRESSION: 1. Stable appearance of spiculated nodule within the anterior left upper lobe. Predominantly sclerotic metastasis involving the left glenoid and bilateral iliac bones are also unchanged. The dominant lesion is in the left iliac bone and appears slightly expansile with both sclerotic and lucent components. 2. Aortic atherosclerosis. Multi vessel coronary artery calcifications noted. 3. Asymmetric low-attenuation within the right subclavian vein is identified. Cannot rule out deep venous thrombosis. Consider further investigation with right upper  extremity venous Doppler ultrasound. Aortic Atherosclerosis (ICD10-I70.0). These results were called by telephone at the time of interpretation on 06/25/2019 at 10:36 am to provider [facility] , who verbally acknowledged these results. Electronically Signed   By: Kerby Moors M.D.   On: 06/25/2019 10:37   CT Chest W Contrast  Result Date: 06/25/2019 CLINICAL DATA:  Non-small cell lung cancer, restaging EXAM: CT CHEST WITH CONTRAST CT ABDOMEN AND PELVIS WITH AND WITHOUT CONTRAST TECHNIQUE: Multidetector CT imaging of the chest was performed during intravenous contrast administration. Multidetector CT imaging of the abdomen and pelvis was performed following the standard protocol before and during bolus administration of intravenous contrast. CONTRAST:  157m OMNIPAQUE IOHEXOL 300 MG/ML  SOLN COMPARISON:  04/25/2019 FINDINGS: CT CHEST FINDINGS Cardiovascular: There is asymmetric low-attenuation within the right subclavian vein, image 26/11. The heart size appears within normal limits. Aortic atherosclerosis. Lad, left circumflex and RCA coronary artery calcifications. No pericardial effusion. Mediastinum/Nodes: Normal appearance of the thyroid gland. The trachea appears patent and is midline. Normal appearance of the esophagus. No enlarged mediastinal or hilar lymph nodes. Lungs/Pleura: No pleural effusion identified. Spiculated nodule within the anterior left upper lobe measures 1.3 cm, image 50/15. Unchanged. No new pulmonary nodule or mass identified. Musculoskeletal: Postop change identified within the cervical spine from previous posterior hardware fusion. Wedge deformities involving the upper thoracic spine, unchanged. Chronic healed fracture involving the proximal body of sternum. Sclerotic lesion involving the left glenoid is stable from previous study. CT ABDOMEN AND PELVIS FINDINGS Hepatobiliary: No focal liver abnormality is seen. Status post cholecystectomy. No biliary dilatation. Pancreas:  Unremarkable. No pancreatic ductal dilatation or surrounding inflammatory changes. Spleen: Normal in size without focal abnormality. Adrenals/Urinary Tract: Normal adrenal glands. Unremarkable appearance of the right kidney. Postoperative  changes around the upper pole of left kidney are again identified and appear unchanged. 9 mm intermediate attenuating structure arising from posterior cortex of the left kidney is similar to previous exam. This is too small to reliably characterize. Nonobstructing stone is noted within the inferior pole collecting system of the left kidney measuring 4 mm. Unremarkable appearance of the urinary bladder. Stomach/Bowel: Stomach is within normal limits. Appendix not confidently identified on today's exam. No evidence of bowel wall thickening, distention, or inflammatory changes. Vascular/Lymphatic: Aortic atherosclerosis. Ectasias of the infrarenal abdominal aorta measures 3 cm. No abdominal no pelvic adenopathy. Reproductive: Mild prostate gland enlargement. Other: No free fluid or fluid collection Musculoskeletal: Extensive postop change identified from posterior hardware fusion at L3 through L5. Bilateral iliac bones sclerotic lesions are stable from previous exam. Within the left iliac bone there is a slightly expansile mixed lucent and sclerotic lesion measuring 3.8 cm, unchanged from previous exam. IMPRESSION: 1. Stable appearance of spiculated nodule within the anterior left upper lobe. Predominantly sclerotic metastasis involving the left glenoid and bilateral iliac bones are also unchanged. The dominant lesion is in the left iliac bone and appears slightly expansile with both sclerotic and lucent components. 2. Aortic atherosclerosis. Multi vessel coronary artery calcifications noted. 3. Asymmetric low-attenuation within the right subclavian vein is identified. Cannot rule out deep venous thrombosis. Consider further investigation with right upper extremity venous Doppler  ultrasound. Aortic Atherosclerosis (ICD10-I70.0). These results were called by telephone at the time of interpretation on 06/25/2019 at 10:36 am to provider [facility] , who verbally acknowledged these results. Electronically Signed   By: Kerby Moors M.D.   On: 06/25/2019 10:37    ASSESSMENT AND PLAN: This is a very pleasant 74 years old white male recently diagnosed with metastatic non-small cell lung cancer, adenocarcinoma presented with left upper lobe lung nodule in addition to metastatic bone disease diagnosed in April 2020. The molecular studies showed no actionable mutations and PDL 1 expression was negative. The patient also has a history of renal cell carcinoma status post wedge resection of the left kidney in September 2019. The patient is currently undergoing systemic chemotherapy with carboplatin for AUC of 5, Alimta 500 mg/M2 and Keytruda 200 mg IV every 3 weeks status post 12 cycles.  Starting from cycle #5 the patient will be treated with maintenance Alimta and Keytruda every 3 weeks. The patient has been tolerating this treatment well with no concerning adverse effects. He had repeat CT scan of the chest, abdomen pelvis performed recently.  I personally and independently reviewed the scans and discussed the results with the patient today. Has a scan showed no concerning findings for disease progression but showed some asymmetric low-attenuation within the right subclavian veins and the venous thrombosis could not be excluded.  He is currently on treatment with Xarelto for previous incidental findings of pulmonary embolism and there is no evidence for pulmonary embolism on the current CT scan of the chest.  The patient has no swelling of the upper extremities.  I discussed the scan results with the patient and his wife. I recommended for him to continue his current treatment with Xarelto for the history of pulmonary embolism and the suspicious deep venous thrombosis. He will also  continue his current systemic chemotherapy with maintenance Alimta and Keytruda and he will proceed with cycle #13 today. The patient will come back for follow-up visit in 3 weeks for evaluation before starting the next cycle of his treatment. He was advised to call immediately  if he has any concerning symptoms in the interval. The patient voices understanding of current disease status and treatment options and is in agreement with the current care plan. All questions were answered. The patient knows to call the clinic with any problems, questions or concerns. We can certainly see the patient much sooner if necessary.  Disclaimer: This note was dictated with voice recognition software. Similar sounding words can inadvertently be transcribed and may not be corrected upon review.

## 2019-07-01 ENCOUNTER — Telehealth: Payer: Self-pay | Admitting: Internal Medicine

## 2019-07-01 NOTE — Telephone Encounter (Signed)
Scheduled per los. Called and left msg. Mailed printout  °

## 2019-07-03 ENCOUNTER — Ambulatory Visit (INDEPENDENT_AMBULATORY_CARE_PROVIDER_SITE_OTHER): Payer: Medicare Other | Admitting: Internal Medicine

## 2019-07-03 ENCOUNTER — Other Ambulatory Visit: Payer: Self-pay

## 2019-07-03 ENCOUNTER — Ambulatory Visit (INDEPENDENT_AMBULATORY_CARE_PROVIDER_SITE_OTHER): Payer: Medicare Other | Admitting: Cardiology

## 2019-07-03 ENCOUNTER — Encounter: Payer: Self-pay | Admitting: Cardiology

## 2019-07-03 VITALS — BP 124/68 | HR 100 | Temp 97.2°F | Resp 16 | Ht 67.0 in | Wt 208.0 lb

## 2019-07-03 VITALS — BP 128/68 | HR 102 | Ht 67.0 in | Wt 205.8 lb

## 2019-07-03 DIAGNOSIS — I2510 Atherosclerotic heart disease of native coronary artery without angina pectoris: Secondary | ICD-10-CM

## 2019-07-03 DIAGNOSIS — Z789 Other specified health status: Secondary | ICD-10-CM | POA: Diagnosis not present

## 2019-07-03 DIAGNOSIS — E782 Mixed hyperlipidemia: Secondary | ICD-10-CM | POA: Diagnosis not present

## 2019-07-03 DIAGNOSIS — C3492 Malignant neoplasm of unspecified part of left bronchus or lung: Secondary | ICD-10-CM

## 2019-07-03 NOTE — Patient Instructions (Signed)
Medication Instructions:   STOP TAKING YOUR CRESTOR (ROSUVASTATIN ) NOW  *If you need a refill on your cardiac medications before your next appointment, please call your pharmacy*   You have been referred to [place]: At [facility], you and your health needs are our priority.  As part of our continuing mission to provide you with exceptional heart care, we have created designated Provider Care Teams.  These Care Teams include your primary Cardiologist (physician) and Advanced Practice Providers (APPs -  Physician Assistants and Nurse Practitioners) who all work together to provide you with the care you need, when you need it.  Your next appointment:   IN MAY 2021 WITH DR. Meda Coffee IN THE CLINIC   The format for your next appointment:   In Person  Provider:   Ena Dawley, MD

## 2019-07-03 NOTE — Progress Notes (Signed)
Cardiology Office Note    Date:  07/05/2019   ID:  [name] [birth year], MRN [id number]  PCP:  [name], MD  Cardiologist: Ena Dawley, MD EPS: None  Chief complaint: Chest pain  History of Present Illness:  [name] is a 74 y.o. male with history of CAD with multiple stents in his LAD and RCA.  Last cath 12/2010 showed total occluded circumflex known from prior cast with collaterals from the RCA.  There was no other significant coronary disease EF 55%.  He is intolerant to statins.  Also has hypertension, HLD.  Patient just underwent robotic partial nephrectomy for left renal mass and repair of incisional hernia 03/02/2018. He was also diagnosed with Stage IV Metastatic, Nonsquamous, lung cancer, currently on Chemotherapy/Immunotherapy.  Lipid profile 02/16/2018 cholesterol 215 triglycerides 579 and non-HDL cholesterol 180 LDL not calculated.  He was seen in the lipid clinic and recommended to start Vascepa however cost was prohibitive.  07/03/2019 the patient is coming after 6 months, he has been overall doing well, he gets chemo and immunotherapy every 3 weeks and feels very tired and slightly short of breath for first 10 days but then he recovers and is able to do his usual activities and does not feel limited. He denies any chest pain, no palpitations no lower extremity edema orthopnea proximal nocturnal dyspnea.  Past Medical History:  Diagnosis Date  . Anginal pain ([place])   . Anxiety    treated /w Xanax for mild depression , using 2 times per day, not PRN  . Asthma   . Benign prostatic hypertrophy   . CAD (coronary artery disease)    pt last left heart cath was in Nov 2008. EF was 55% on left ventriculogram. Circumflex was totally occluded after the 1st obtuse marginal with collaterals supplying the distal circumflex. LAD showed luminal irregularities. The stent in LAD was patent. The RCA showed luminal irregularities. The stent in th emid RCA and  PDA were patent. There were no inverventions.   . Chest pain    from anxiety last time 1 month ago  . 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 Chronic obstructive pulmonary disease (HCC)    asthma  . Cluster headache    relative to sinus problems none recnt  . Constipation   . Depression   . DM2 (diabetes mellitus, type 2) ([place])    well with diet and exercise controlled  . GERD (gastroesophageal reflux disease)    hiatal hernia. Patient did have a Nissen fundoplication rare  . Heart murmur    heard 30 yrs ago  . History of blood transfusion    need for Bld. transfusion relative to taking NSAIDS  . HTN (hypertension)    pt. followed by [facility], last cardiac visit 2012, one yr. ago  . Hyperlipidemia   . Joint pain   . Lactose intolerance   . Left kidney mass   . Low back pain    chronic  . Metastatic non-small cell lung cancer (HCC)    adenocarcinoma, LUL, bone metastasis in 09/2018  . Neuromuscular disorder ([place])    nerve involvement in back & upper back relative to hardware in neck   . Obesity   . OSA and COPD overlap syndrome ([place]) 02/25/2015   no cpap used pt denies sleep apnea  . Osteoarthritis   . Peptic ulcer disease   . Pneumonia not recent per pt  . Skin cancer  basal cell- face & head, 1 melanoma revoved from left side of face  . Sleep concern    states the study (2003 )was done at [facility] it failed & he was told to return & he never did. Pt. states he has been found by prev. hosp. staff that he has to be told when to breathe & his wife does the same.   Marland Kitchen Spinal fracture    hx of traumatic in Jun 04, 2007 after falling off the roof  . Tinnitus, bilateral   . Vitamin D deficiency     Past Surgical History:  Procedure Laterality Date  . BACK SURGERY     x3- last surgery lumbar- 1998- / fusion   . blepheroplasty     both eyesx2  . C5-T6 posterior fusion     with [place] and radius screws  . [place], 2008 & 2012 multiple stents  total 4 times  . cataracts     cataracts removed- /w IOL - both eyes   . CERVICAL SPINE SURGERY    . COLONOSCOPY    . ESOPHAGEAL MANOMETRY    . HARDWARE REMOVAL  02/20/2012   Procedure: HARDWARE REMOVAL;  Surgeon: [name], MD;  Location: [facility];  Service: Neurosurgery;  Laterality: N/A;  Hardware Removal  . HIATAL HERNIA REPAIR  1979, spring 2009  . IR IMAGING GUIDED PORT INSERTION  10/29/2018  . LAPAROSCOPIC CHOLECYSTECTOMY     & IOC  . multiple percutaneous coronary interventions    . NASAL SINUS SURGERY     x2, sees Dr. [name], still having problems, states he uses Benadryl PRN- up to 5 times per day   . right temporal artery biopsy    . right wrist plate insertion  76/1607  . ROBOTIC ASSITED PARTIAL NEPHRECTOMY Left 03/02/2018   Procedure: XI ROBOTIC ASSITED LEFT PARTIAL NEPHRECTOMY WITH LYSIS OF ADHESIONS X30 MINUTES.;  Surgeon: Ardis Hughs, MD;  Location: [facility];  Service: Urology;  Laterality: Left;  CLAMP TIME FOR KIDNEY 1053-1110 TOTAL 18MINUTES  . Rotator cuff surgery Right   . SKIN BIOPSY Right 10/31/2017   Chest  . TRANSURETHRAL RESECTION OF BLADDER TUMOR N/A 11/03/2017   Procedure: cystoscopy;  Surgeon: Kathie Rhodes, MD;  Location: [facility];  Service: Urology;  Laterality: N/A;  . UMBILICAL HERNIA REPAIR    . UPPER GI ENDOSCOPY    . VENTRAL HERNIA REPAIR N/A 03/02/2018   Procedure: LAPAROSCOPIC REPAIR OF INCARCERATED INCISIONAL HERNIA WITH LYSIS OF ADHESIONS;  Surgeon: Michael Boston, MD;  Location: [facility];  Service: General;  Laterality: N/A;    Current Medications: Current Meds  Medication Sig  . acetaminophen (TYLENOL) 500 MG tablet Take 1 tablet (500 mg total) by mouth every 6 (six) hours as needed.  Marland Kitchen albuterol (VENTOLIN HFA) 108 (90 Base) MCG/ACT inhaler Inhale 2 puffs into the lungs every 4 (four) hours as needed for wheezing or shortness of breath.  . Alpha-Lipoic Acid 200 MG CAPS Take by mouth 2  (two) times daily.  . Ascorbic Acid (VITAMIN C) 1000 MG tablet Take 1,000 mg by mouth 2 (two) times daily.  . Blood Glucose Monitoring Suppl (FREESTYLE FREEDOM LITE) w/Device KIT Check blood sugar 1 time a day. [id number]  . budesonide-formoterol (SYMBICORT) 160-4.5 MCG/ACT inhaler Use twice daily, wash your mouth afterwards to avoid yeast.  . Cholecalciferol (VITAMIN D3) 5000 units CAPS Take 5,000 Units by mouth 2 (two) times daily.  . CHOLINE PO Take 600  mg by mouth daily.  Marland Kitchen CINNAMON PO Take by mouth 2 (two) times daily.  . diazepam (VALIUM) 5 MG tablet Take 1/2 to 1 tablet 2 to 3 x /day as needed for Anxiety  . DOXYLAMINE SUCCINATE PO Take 0.5 tablets by mouth at bedtime.   . folic acid (FOLVITE) 1 MG tablet Take 1 tablet (1 mg total) by mouth daily.  Marland Kitchen ipratropium (ATROVENT) 0.03 % nasal spray Place 1-2 sprays into both nostrils daily as needed (for allergies.).  Marland Kitchen lidocaine-prilocaine (EMLA) cream Apply 1 application topically as needed.  . magnesium oxide (MAG-OX) 400 MG tablet Take 1,200 mg by mouth as needed.  Marland Kitchen MELATONIN PO Take 30 mg by mouth at bedtime.  . Multiple Vitamins-Minerals (MULTIVITAMIN ADULT PO) Take by mouth daily.  . nitroGLYCERIN (NITROSTAT) 0.4 MG SL tablet Dissolve 1 tablet under tongue every 3 to 5 minutes if needed for Chest Pain  . OVER THE COUNTER MEDICATION Taking OTC Iron 1 tablet daily.  . prochlorperazine (COMPAZINE) 10 MG tablet Take 1 tablet (10 mg total) by mouth every 6 (six) hours as needed for nausea or vomiting.  . SUPER B COMPLEX/C PO Take by mouth daily.  . tamsulosin (FLOMAX) 0.4 MG CAPS capsule Take 1 capsule Daily for for Prostate  . tetrahydrozoline-zinc (VISINE-AC) 0.05-0.25 % ophthalmic solution Place 1-2 drops into both eyes 3 (three) times daily as needed (for redness/irritation.).  Marland Kitchen triamcinolone (NASACORT) 55 MCG/ACT AERO nasal inhaler Place 2 sprays into the nose at bedtime.  . Triamcinolone Acetonide (NASAL ALLERGY 24 HOUR NA) SMARTSIG:2  Spray(s) Both Nares Every Night  . triamcinolone ointment (KENALOG) 0.1 % Apply 1 application topically 2 (two) times daily.  . Turmeric 500 MG TABS Take by mouth. Takes 3 in the morning and 3 in the evening  . XARELTO 20 MG TABS tablet TAKE 1 TABLET BY MOUTH EVERY DAY WITH SUPPER  . Zinc 50 MG TABS Take by mouth daily.     Allergies:   Codeine, Meloxicam, Morphine and related, Nsaids, Oxycodone, Crestor [rosuvastatin], Cymbalta [duloxetine hcl], Dilaudid [hydromorphone hcl], Effexor [venlafaxine], Gluten meal, Topamax [topiramate], Tramadol hcl, Trazodone and nefazodone, Adhesive [tape], Penicillins, and Sulfa antibiotics   Social History   Socioeconomic History  . Marital status: Married    Spouse name: Not on file  . Number of children: Not on file  . Years of education: Not on file  . Highest education level: Not on file  Occupational History  . Occupation: Retired  Tobacco Use  . Smoking status: Former Smoker    Packs/day: 1.00    Years: 20.00    Pack years: 20.00    Types: Cigarettes    Quit date: 02/14/1978    Years since quitting: 41.4  . Smokeless tobacco: Never Used  Substance and Sexual Activity  . Alcohol use: Not Currently    Alcohol/week: 0.0 standard drinks    Comment: rare  . Drug use: No  . Sexual activity: Not on file  Other Topics Concern  . Not on file  Social History Narrative  . Not on file   Social Determinants of Health   Financial Resource Strain:   . Difficulty of Paying Living Expenses: Not on file  Food Insecurity:   . Worried About Charity fundraiser in the Last Year: Not on file  . Ran Out of Food in the Last Year: Not on file  Transportation Needs:   . Lack of Transportation (Medical): Not on file  . Lack of Transportation (Non-Medical): Not  on file  Physical Activity:   . Days of Exercise per Week: Not on file  . Minutes of Exercise per Session: Not on file  Stress:   . Feeling of Stress : Not on file  Social Connections:   .  Frequency of Communication with Friends and Family: Not on file  . Frequency of Social Gatherings with Friends and Family: Not on file  . Attends Religious Services: Not on file  . Active Member of Clubs or Organizations: Not on file  . Attends Archivist Meetings: Not on file  . Marital Status: Not on file     Family History:  The patient's family history includes Coronary artery disease in his father; Gout in his sister; Heart attack in his brother, brother, father, and mother; Heart failure in his mother; Hypertension in his father and mother; Obesity in his mother.   ROS:   Please see the history of present illness.    Review of Systems  Constitution: Positive for malaise/fatigue and weight gain.  HENT: Negative.   Cardiovascular: Negative.   Respiratory: Negative.   Endocrine: Negative.   Hematologic/Lymphatic: Negative.   Musculoskeletal: Negative.   Gastrointestinal: Negative.        Abdominal soreness from recent surgery  Genitourinary: Negative.   Neurological: Positive for weakness.   All other systems reviewed and are negative.  PHYSICAL EXAM:   VS:  BP 128/68   Pulse (!) 102   Ht 5' 7"  (1.702 m)   Wt 205 lb 12.8 oz (93.4 kg)   SpO2 94%   BMI 32.23 kg/m   Physical Exam  GEN: Obese, in no acute distress  Neck: no JVD, carotid bruits, or masses Cardiac:RRR; positive S4 no murmurs, rubs  Respiratory:  clear to auscultation bilaterally, normal work of breathing GI: soft, nontender, nondistended, + BS Ext: without cyanosis, clubbing, or edema, Good distal pulses bilaterally Neuro:  Alert and Oriented x 3, Strength and sensation are intact Psych: euthymic mood, full affect  Wt Readings from Last 3 Encounters:  07/03/19 208 lb (94.3 kg)  07/03/19 205 lb 12.8 oz (93.4 kg)  06/27/19 204 lb 8 oz (92.8 kg)    Studies/Labs Reviewed:   EKG:  EKG is been ordered today.  It shows normal sinus rhythm, normal EKG, unchanged from prior, this was personally  reviewed.  Recent Labs: 06/03/2019: Magnesium 1.8 06/27/2019: ALT 23; BUN 30; Creatinine 1.15; Hemoglobin 13.3; Platelet Count 147; Potassium 4.2; Sodium 136; TSH 1.057   Lipid Panel    Component Value Date/Time   CHOL 154 06/03/2019 0841   TRIG 170 (H) 06/03/2019 0841   HDL 46 06/03/2019 0841   CHOLHDL 3.3 06/03/2019 0841   VLDL 31 (H) 01/13/2017 1038   LDLCALC 81 06/03/2019 0841   LDLDIRECT 116.0 06/03/2013 1513   Additional studies/ records that were reviewed today include:  Cardiac catheterization 12/2010  FINDINGS: 1. Hemodynamics:  LV 118/18, aorta 127/70. 2. Left ventriculography:  EF was estimated to be 55%.  There were no     regional wall motion abnormalities in the RAO projection. 3. Right coronary artery:  The right coronary was a dominant vessel.     There was a proximal-to-mid right coronary artery stent.  There was     about 25% in-stent restenosis.  There was about 30% distal RCA     stenosis.  The PLV and PDA were both moderate-sized vessels.  There     was about 40-50% ostial PLV stenosis.  The stent in the  PDA was     patent.  There were luminal irregularities diffusely in the PDA.     The PLV did provide right-to-left collaterals to the distal     circumflex and also supplied 2 small-to-moderate obtuse marginals. 4. Left main:  The left main had no angiographic coronary disease. 5. Left circumflex system:  The left circumflex was totally occluded     after the first obtuse marginal.  This was known from prior heart     catheterizations.  The first obtuse marginal was a small-to-     moderate vessel with luminal irregularities.  The distal circumflex     was reconstituted via collaterals in the PLV.  There were 2 small-     to-moderate obtuse marginals filled via the collaterals. 6. LAD system:  There was a proximal LAD stent with 30-40% in-stent     restenosis.  The remainder of the LAD had luminal irregularities.     There was a small first diagonal.  There  was a moderate-sized     second diagonal that had about 40% ostial stenosis.   IMPRESSION:  The patient has a known occluded circumflex which was seen again today.  There were good collaterals from the right coronary artery system.  The patient's other vessels have no significant disease.  His stents were patent.  We will plan on medical treatment for now.  He will try Prilosec for possible reflux symptoms.     Loralie Champagne, MD DM/MEDQ  D:  01/07/2011  T:  01/07/2011  Job:  [id number]    ASSESSMENT:    1. Mixed hyperlipidemia   2. Atherosclerosis of native coronary artery of native heart without angina pectoris   3. Hyperlipidemia, mixed   4. Statin intolerance    PLAN:  In order of problems listed above:  CAD status post multiple stents in the LAD and RCA.  Last cath 12/2010 with total circumflex with collaterals from RCA, EF 55%.  He underwent nuclear Lexiscan stress test for chest pain and dyspnea on exertion in July however it showed normal LVEF and no ischemia.  He symptoms are most probably related to his intense chemotherapy.  Hypertension -well-controlled.  Hyperlipidemia has failed statins.  I referred to lipid clinic, he was recommended for Vascepa  but that was cost prohibitive we will ask lipid clinic if there are any other options for him.  Obesity exercise and weight loss program such as weight watchers recommended.  Medication Adjustments/Labs and Tests Ordered: Current medicines are reviewed at length with the patient today.  Concerns regarding medicines are outlined above.  Medication changes, Labs and Tests ordered today are listed in the Patient Instructions below. Patient Instructions  Medication Instructions:   STOP TAKING YOUR CRESTOR (ROSUVASTATIN ) NOW  *If you need a refill on your cardiac medications before your next appointment, please call your pharmacy*   You have been referred to [place]: At [facility], you and your health needs are our priority.  As part of our continuing mission to provide you with exceptional heart care, we have created designated Provider Care Teams.  These Care Teams include your primary Cardiologist (physician) and Advanced Practice Providers (APPs -  Physician Assistants and Nurse Practitioners) who all work together to provide you with the care you need, when you need it.  Your next appointment:   IN MAY 2021 [place]  The format for your next appointment:   In Person  Provider:   Ena Dawley, MD       Signed, Ena Dawley, MD  07/05/2019 1:03 PM    [address], [place], [place]  [address] Phone: [phone]; Fax: [phone]

## 2019-07-03 NOTE — Progress Notes (Signed)
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 He presents today with his wife to discuss ongoing treatments and he is encouraged to continue treatments.     Medications  .  ZIAC 5-6.25 MG tablet, Take 1 tablet Daily for BP .  VASCEPA 1 g CAPS, Take 2 capsules (2 g total) by mouth 2 (two) times a day. Marland Kitchen  NITROSTAT 0.4 MG SL tablet, Dissolve 1 tablet under tongue every 3 to 5 minutes if needed for Chest Pain .  rosuvastatin (CRESTOR) 5 MG tablet, Take 1 tablet  2 times a week.  Marland Kitchen  albuterol  108 inhaler, Inhale 2 puffs into the lungs every 4  hours as needed for wheezing or shortness of breath. .  SYMBICORT 160-4.5inhaler, Use twice daily .  DOXYLAMINE SUCCINATE PO, Take 0.5 tablets  at bedtime.  Marland Kitchen  ipratropium  0.03 % nasal spray, Place 1-2 sprays into both nostrils daily as needed  .  NASACORT AERO nasal inhaler, Place 2 sprays into the nose at bedtime. .  Triamcinolone Acetonide , 2 Spray(s) Both Nares Every Night .  acetaminophen  500 MG tablet, Take 1 tablet  every 6  hours as needed. .  folic acid (FOLVITE) 1 MG tablet, Take 1 tablet (1 mg total) by mouth daily. Alveda Reasons 20 MG TABS tablet, TAKE 1 TABLET EVERY DAY  .  Alpha-Lipoic Acid 200 MG CAPS, Take  2  times daily. Marland Kitchen  VITAMIN C1000 MG tab, Take 1,000 mg  2  times daily. Marland Kitchen  VITAMIN D 5000 units CAPS, Take 5,000 Units 2  times daily. .  CHOLINE , Take 600 mg  daily. Marland Kitchen  CINNAMON, Take 2 times daily. .  diazepam  5 MG tablet, Take 1/2 to 1 tablet 2 to 3 x /day as needed for Anxiety .  lidocaine-prilocaine (EMLA) cream, Apply 1 application topically as needed. .  magnesium  400 MG tablet, Take 1,200 mg  as needed. Marland Kitchen  MELATONIN PO, Take 30 mg  at bedtime. .  Multiple Vitamins-Minerals , Take  daily. .  Taking OTC Iron 1 tablet daily. .  prochlorperazine  10 MG tablet, Take 1 tablet  every 6  hours as needed for nausea or vomiting. .  SUPER B COMPLEX/C PO, Take daily. .  tamsulosin 0.4 MG CAPS capsule, Take 1 capsule Daily for for Prostate .  VISINE-AC 0.05-0.25 % ophth soln, Place 1-2 drops into both eyes 3  times daily as needed .  triamcinolone oint  0.1 %, Apply 1 application topically 2times daily. .  Turmeric 500 MG TABS, Takes 3 in the morning and 3 in the evening .  Zinc 50 MG TABS, Take daily.  Problem list He has Hyperlipidemia; Cluster headache syndrome; Essential hypertension; Coronary atherosclerosis; COPD mixed type ([place]); GERD; Lumbago, Chronic; PUD; BPH/Prostatism; Vitamin D deficiency; Medication management; Rhinitis, nonallergic, chronic; Obesity (BMI 30.0-34.9); Encounter for Medicare annual wellness exam; OSA and COPD overlap syndrome ([place]); Aortic atherosclerosis ([place]); Anxiety; Other abnormal glucose; Left renal mass s/p robotic partial nephrectomy 03/02/2018; Recurrent incisional hernia with incarceration s/p lap repair w mesh 03/02/2018; Chronic kidney disease (CKD), stage III (moderate); Renal cell carcinoma, left ([place]); Adenocarcinoma of left lung, stage 4 ([place]); Goals of care, counseling/discussion; Encounter for antineoplastic chemotherapy; Encounter for antineoplastic immunotherapy; Port-A-Cath in place; and Pulmonary embolism (  [place]) on their problem list.   Observations/Objective:  BP 124/68   Pulse 100   Temp (!) 97.2 F (36.2 C)   Resp 16   Ht 5\' 7"  (1.702 m)   Wt 208 lb (94.3 kg)   BMI 32.58 kg/m   HEENT - WNL. Neck - supple.  Chest - Clear equal BS. Cor - Nl HS. RRR w/o sig MGR. PP 1(+). No edema. MS- FROM w/o deformities.  Gait Nl. Neuro -  Nl w/o focal abnormalities. Assessment and Plan:   1. Adenocarcinoma of left lung, stage 4 ([place])      I discussed the assessment and treatment plan with the patient & patient was  encouraged to continue his ChemoTx. The patient was provided an opportunity to ask questions and all were answered. The  patient agreed with the plan and demonstrated an understanding of the instructions. The patient was advised to call back or seek an in-person evaluation if the symptoms worsen or if the condition fails to improve as anticipated. Between 15-20 minutes of counseling, chart review, exam and critical decision making was performed  Kirtland Bouchard, MD

## 2019-07-04 ENCOUNTER — Ambulatory Visit: Payer: Medicare Other | Attending: Internal Medicine

## 2019-07-04 DIAGNOSIS — Z23 Encounter for immunization: Secondary | ICD-10-CM

## 2019-07-04 NOTE — Progress Notes (Signed)
   Covid-19 Vaccination Clinic  Name:  [name]    MRN: [id number] DOB: Aug 30, 1945  07/04/2019  [name] was observed post Covid-19 immunization for 30 minutes based on pre-vaccination screening without incidence. He was provided with Vaccine Information Sheet and instruction to access the V-Safe system.   [name] was instructed to call 911 with any severe reactions post vaccine: Marland Kitchen Difficulty breathing  . Swelling of your face and throat  . A fast heartbeat  . A bad rash all over your body  . Dizziness and weakness    Immunizations Administered    Name Date Dose VIS Date Route   Pfizer COVID-19 Vaccine 07/04/2019  1:38 PM 0.3 mL 05/24/2019 Intramuscular   Manufacturer: [place]   Lot: [id number]   [place]: [id number]

## 2019-07-06 ENCOUNTER — Encounter: Payer: Self-pay | Admitting: Internal Medicine

## 2019-07-08 DIAGNOSIS — M75121 Complete rotator cuff tear or rupture of right shoulder, not specified as traumatic: Secondary | ICD-10-CM | POA: Diagnosis not present

## 2019-07-15 NOTE — Progress Notes (Signed)
Patient ID: [name] [facility]                 DOB: 08/05/1945                    MRN: [id number]     HPI: [name] is a 74 y.o. male with history of CAD (multiple stents in his LAD and RCA), HTN, HLD, hx of PE (2020), COPD, OSA, GERD, PUD, hx of cluster headaches (2016), BPH, and vitamin D deficiency.  Last cath 12/2010 showed total occluded circumflex known from prior cast with collaterals from the RCA. Patient underwent robotic partial nephrectomy for left renal mass and repair of incisional hernia 03/02/2018. He was also diagnosed with Stage IV Metastatic, Nonsquamous, lung cancer, currently on chemotherapy/immunotherapy.  Patient has been seen previously in lipid clinic, most recently Sept 2020. Pt was instructed to increase rosuvastatin 5 mg weekly to rosuvastatin 5 mg twice weekly. Vascepa 1 mg BID was continued. Pt was previously approved for [employer] so that [place] would be free, however pt would not start therapy because his insurance was being charged for the medication. Explained that insurance gets charged for every med he takes and that baseline price of any branded drug is higher, he still declined therapy.  Pt states he stopped taking his rosuvastatin and that it should not have been prescribed again because he previously did not tolerate it. Does not recall if he had trouble tolerating once or twice weekly dosing most recently since he has also had aches from chemo. He stopped Vascepa in December 2020 due to being in the donut hole (also on Xarelto).  Current Medications: none Intolerances: rosuvastatin 33m once and twice weekly (soreness/aching), atorvastatin, pravastatin, simvastatin, lovastatin (soreness/aching), ezetimibe 13mdaily (cost -prohibitive), niacin (stopped due to lack of efficacy), Welchol, Repatha (wouldn't take even though free because his insurance was charged too much), Vascepa (same as Repatha) Risk Factors: CAD (multiple stents in his LAD and RCA),  HTN, BMI, former smoker LDL goal: <70 mg/dL  Diet: Homemade soup. Limits beef/pork.  Exercise: None - no energy due to chemo  Family History: Coronary artery disease in his father; Gout in his sister; Heart attack in his brother, brother, father, and mother; Heart failure in his mother; Hypertension in his father and mother; Obesity in his mother.  Social History: former smoker, occasional alcohol  Labs: 06/03/2019 TC 154 TG 170 HDL 46 LDL 81; rosuvastatin 5 mg twice weekly and Vascepa 1 mg  BID 02/28/2019 TC 150 TG 166 HDL 39 LDL 85; rosuvastatin 5 mg once weekly and Vascepa 1 mg BID  11/28/2018 TC 219 TG 287 HDL 40 LDL 135; likely none (per chart review he was instructed to try rosuvastatin 5 mg 1-2x weekly but labs prove otherwise)  Past Medical History:  Diagnosis Date  . Anginal pain ([place]  . Anxiety    treated /w Xanax for mild depression , using 2 times per day, not PRN  . Asthma   . Benign prostatic hypertrophy   . CAD (coronary artery disease)    pt last left heart cath was in Nov 2008. EF was 55% on left ventriculogram. Circumflex was totally occluded after the 1st obtuse marginal with collaterals supplying the distal circumflex. LAD showed luminal irregularities. The stent in LAD was patent. The RCA showed luminal irregularities. The stent in th emid RCA and PDA were patent. There were no inverventions.   . Chest pain    from anxiety last  time 1 month ago  . 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 Chronic obstructive pulmonary disease (HCC)    asthma  . Cluster headache    relative to sinus problems none recnt  . Constipation   . Depression   . DM2 (diabetes mellitus, type 2) ([place])    well with diet and exercise controlled  . GERD (gastroesophageal reflux disease)    hiatal hernia. Patient did have a Nissen fundoplication rare  . Heart murmur    heard 30 yrs ago  . History of blood transfusion    need for Bld. transfusion  relative to taking NSAIDS  . HTN (hypertension)    pt. followed by [facility], last cardiac visit 2012, one yr. ago  . Hyperlipidemia   . Joint pain   . Lactose intolerance   . Left kidney mass   . Low back pain    chronic  . Metastatic non-small cell lung cancer (HCC)    adenocarcinoma, LUL, bone metastasis in 09/2018  . Neuromuscular disorder ([place])    nerve involvement in back & upper back relative to hardware in neck   . Obesity   . OSA and COPD overlap syndrome ([place]) 02/25/2015   no cpap used pt denies sleep apnea  . Osteoarthritis   . Peptic ulcer disease   . Pneumonia not recent per pt  . 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 Sleep concern    states the study (2003 )was done at [facility]., it failed & he was told to return & he never did. Pt. states he has been found by prev. hosp. staff that he has to be told when to breathe & his wife does the same.   Marland Kitchen Spinal fracture    hx of traumatic in Jun 04, 2007 after falling off the roof  . Tinnitus, bilateral   . Vitamin D deficiency     Current Outpatient Medications on File Prior to Visit  Medication Sig Dispense Refill  . acetaminophen (TYLENOL) 500 MG tablet Take 1 tablet (500 mg total) by mouth every 6 (six) hours as needed. 30 tablet 0  . albuterol (VENTOLIN HFA) 108 (90 Base) MCG/ACT inhaler Inhale 2 puffs into the lungs every 4 (four) hours as needed for wheezing or shortness of breath. 18 g 1  . Alpha-Lipoic Acid 200 MG CAPS Take by mouth 2 (two) times daily.    . Ascorbic Acid (VITAMIN C) 1000 MG tablet Take 1,000 mg by mouth 2 (two) times daily.    . Blood Glucose Monitoring Suppl (FREESTYLE FREEDOM LITE) w/Device KIT Check blood sugar 1 time a day. [id number] 1 each 0  . budesonide-formoterol (SYMBICORT) 160-4.5 MCG/ACT inhaler Use twice daily, wash your mouth afterwards to avoid yeast. 1 Inhaler 12  . Cholecalciferol (VITAMIN D3) 5000 units CAPS Take 5,000 Units  by mouth 2 (two) times daily.    . CHOLINE PO Take 600 mg by mouth daily.    Marland Kitchen CINNAMON PO Take by mouth 2 (two) times daily.    . diazepam (VALIUM) 5 MG tablet Take 1/2 to 1 tablet 2 to 3 x /day as needed for Anxiety 270 tablet 0  . DOXYLAMINE SUCCINATE PO Take 0.5 tablets by mouth at bedtime.     . folic acid (FOLVITE) 1 MG tablet Take 1 tablet (1 mg total) by mouth daily. 30 tablet 4  . ipratropium (  ATROVENT) 0.03 % nasal spray Place 1-2 sprays into both nostrils daily as needed (for allergies.). 30 mL 3  . lidocaine-prilocaine (EMLA) cream Apply 1 application topically as needed. 30 g 0  . magnesium oxide (MAG-OX) 400 MG tablet Take 1,200 mg by mouth as needed.    Marland Kitchen MELATONIN PO Take 30 mg by mouth at bedtime.    . Multiple Vitamins-Minerals (MULTIVITAMIN ADULT PO) Take by mouth daily.    . nitroGLYCERIN (NITROSTAT) 0.4 MG SL tablet Dissolve 1 tablet under tongue every 3 to 5 minutes if needed for Chest Pain 50 tablet 3  . OVER THE COUNTER MEDICATION Taking OTC Iron 1 tablet daily.    . prochlorperazine (COMPAZINE) 10 MG tablet Take 1 tablet (10 mg total) by mouth every 6 (six) hours as needed for nausea or vomiting. 30 tablet 0  . SUPER B COMPLEX/C PO Take by mouth daily.    . tamsulosin (FLOMAX) 0.4 MG CAPS capsule Take 1 capsule Daily for for Prostate 90 capsule 3  . tetrahydrozoline-zinc (VISINE-AC) 0.05-0.25 % ophthalmic solution Place 1-2 drops into both eyes 3 (three) times daily as needed (for redness/irritation.).    Marland Kitchen triamcinolone (NASACORT) 55 MCG/ACT AERO nasal inhaler Place 2 sprays into the nose at bedtime. 1 Inhaler 3  . Triamcinolone Acetonide (NASAL ALLERGY 24 HOUR NA) SMARTSIG:2 Spray(s) Both Nares Every Night    . triamcinolone ointment (KENALOG) 0.1 % Apply 1 application topically 2 (two) times daily. 80 g 1  . Turmeric 500 MG TABS Take by mouth. Takes 3 in the morning and 3 in the evening    . XARELTO 20 MG TABS tablet TAKE 1 TABLET BY MOUTH EVERY DAY WITH SUPPER 30  tablet 2  . Zinc 50 MG TABS Take by mouth daily.     No current facility-administered medications on file prior to visit.    Allergies  Allergen Reactions  . Codeine Itching and Other (See Comments)    Also hallucinations   . Meloxicam Rash  . Morphine And Related Other (See Comments)    Hallucinations  . Nsaids Other (See Comments)    BLEEDING--naprosyn  . Oxycodone     Hallucinations  . Crestor [Rosuvastatin] Other (See Comments)    Soreness/aching  . Cymbalta [Duloxetine Hcl] Other (See Comments)    Pt is unsure of reaction type  . Dilaudid [Hydromorphone Hcl] Itching and Other (See Comments)    Hallucinations.  . Effexor [Venlafaxine] Other (See Comments)    Unsure of reactions type  . Gluten Meal Other (See Comments)    Runny nose (constant); bloating.  . Topamax [Topiramate] Other (See Comments)    Caused visual impairments/swelling in eyes--pinch off optic nerve (temporary blindness)  . Tramadol Hcl   . Trazodone And Nefazodone Other (See Comments)    Unsure of reaction type  . Adhesive [Tape] Other (See Comments)    SKIN PEELS--PAPER TAPE IS OKAY  . Penicillins Rash  . Sulfa Antibiotics Swelling, Rash and Other (See Comments)    Mouth sores    Assessment/Plan:  1. Hyperlipidemia - LDL goal < 70 mg/dL; therefore, patient is not at goal. Previously intolerant to 5 statins. Will start ezetimibe 29m daily as this should no longer be cost prohibitive. Also sent in rx for generic Lovaza 2g BID (he will not take branded meds for cholesterol because his insurance is charged more and he goes into the donut hole sooner even despite pt assistance). He reports his PCP may check lipids in a few months. If  not, can have them rechecked at May 2021 appt with Dr Meda Coffee.   [name], PharmD PGY2 Ambulatory Care Pharmacy Resident  [place]. [place], PharmD, BCACP, [place] [address], [place],  [address] Phone: [phone]; Fax: [phone] 07/16/2019 10:27 AM

## 2019-07-16 ENCOUNTER — Other Ambulatory Visit: Payer: Self-pay

## 2019-07-16 ENCOUNTER — Ambulatory Visit (INDEPENDENT_AMBULATORY_CARE_PROVIDER_SITE_OTHER): Payer: Medicare Other | Admitting: Pharmacist

## 2019-07-16 DIAGNOSIS — Z1211 Encounter for screening for malignant neoplasm of colon: Secondary | ICD-10-CM | POA: Diagnosis not present

## 2019-07-16 DIAGNOSIS — E785 Hyperlipidemia, unspecified: Secondary | ICD-10-CM

## 2019-07-16 DIAGNOSIS — T466X5A Adverse effect of antihyperlipidemic and antiarteriosclerotic drugs, initial encounter: Secondary | ICD-10-CM

## 2019-07-16 DIAGNOSIS — G720 Drug-induced myopathy: Secondary | ICD-10-CM | POA: Diagnosis not present

## 2019-07-16 DIAGNOSIS — Z1212 Encounter for screening for malignant neoplasm of rectum: Secondary | ICD-10-CM

## 2019-07-16 DIAGNOSIS — I2510 Atherosclerotic heart disease of native coronary artery without angina pectoris: Secondary | ICD-10-CM | POA: Diagnosis not present

## 2019-07-16 LAB — POC HEMOCCULT BLD/STL (HOME/3-CARD/SCREEN)
Card #2 Fecal Occult Blod, POC: NEGATIVE
Card #3 Fecal Occult Blood, POC: NEGATIVE
Fecal Occult Blood, POC: NEGATIVE

## 2019-07-16 MED ORDER — OMEGA-3-ACID ETHYL ESTERS 1 G PO CAPS: 2.0000 g | ORAL_CAPSULE | Freq: Two times a day (BID) | ORAL | 3 refills | Status: AC

## 2019-07-16 MED ORDER — EZETIMIBE 10 MG PO TABS: 10.0000 mg | ORAL_TABLET | Freq: Every day | ORAL | 3 refills | Status: DC

## 2019-07-16 NOTE — Patient Instructions (Addendum)
Your LDL goal is less than 70  Your triglyceride goal is less than 150  Start taking ezetimibe (Zetia) 10mg  once daily - this will lower your LDL cholesterol by 20%  I will send a prescription for the generic fish oil for Lovaza to your pharmacy - you can see if this is cheaper than over the counter fish oil. Take a total of 4,000mg  each day  Have your primary care doctor check your labs in a few months. If he does not, Dr Meda Coffee can check your cholesterol when you see her in May (come fasting to the appointment if so)

## 2019-07-17 NOTE — Progress Notes (Signed)
[facility] OFFICE PROGRESS NOTE  [name], [address] [place] [address]  DIAGNOSIS:  1)Stage IV (T1c, N0, M1 C) non-small cell lung cancer, adenocarcinoma presented with left upper lobe lung nodule in addition to metastatic disease to the bone diagnosed in April 2020. 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  Molecular studies by [employer]64  STK11Splice Site SNV 4.0% Everolimus,Temsirolimus  [id number] 0.8% None (VUS),No (VUS)  NTRK3 L629L 1.5% (Synonymous)No (Synonymous)  KRAS G12D 1.1% Binimetinib  CDKN2A H83Y 1.6% Abemaciclib, Palbociclib, Ribociclib  PRIOR THERAPY: None  CURRENT THERAPY: Systemic chemotherapy with carboplatin for AUC of 5, Alimta 500 mg/M2 and Keytruda 200 mg IV every 3 weeks. First dose Oct 16, 2018.Status post13cyclesof treatment. Starting from cycle #5 the patient has been on maintenance Alimta and Keytruda.  INTERVAL HISTORY: KAchilles Mccullough 74y.o. male returns to the clinic for a follow up visit. The patient is feeling well today without any concerning complaints except for fatigue and mild bilateral lower extremity swelling. He states his swelling improves after elevation of his extremities. He had a little vit more nausea without vomiting after the most recent cycle of treatment for which he took his antiemetic with resolution of his symptoms. Denies any fever, chills, night sweats, or weight loss. His appetite comes and goes. Denies any chest or hemoptysis. Reports baseline dyspnea on exertion and baseline cough. Denies any vomiting, diarrhea, or constipation. Denies any headache or visual changes. Denies any rashes or skin changes. The patient is here today for evaluation prior to starting cycle # 14  MEDICAL HISTORY: Past Medical History:  Diagnosis Date  . Anginal pain ([facility]   . Anxiety     treated /w Xanax for mild depression , using 2 times per day, not PRN  . Asthma   . Benign prostatic hypertrophy   . CAD (coronary artery disease)    pt last left heart cath was in Nov 2008. EF was 55% on left ventriculogram. Circumflex was totally occluded after the 1st obtuse marginal with collaterals supplying the distal circumflex. LAD showed luminal irregularities. The stent in LAD was patent. The RCA showed luminal irregularities. The stent in th emid RCA and PDA were patent. There were no inverventions.   . Chest pain    from anxiety last time 1 month ago  . Chromophobe renal cell carcinoma ([facility] 02/2018   s/p left kidney wedge resection  . Chronic obstructive pulmonary disease (HCC)    asthma  . Cluster headache    relative to sinus problems none recnt  . Constipation   . Depression   . DM2 (diabetes mellitus, type 2) ([facility]    well with diet and exercise controlled  . GERD (gastroesophageal reflux disease)    hiatal hernia. Patient did have a Nissen fundoplication rare  . Heart murmur    heard 30 yrs ago  . History of blood transfusion    need for Bld. transfusion relative to taking NSAIDS  . HTN (hypertension)    pt. followed by [facility], last cardiac visit 2012, one yr. ago  . Hyperlipidemia   . Joint pain   . Lactose intolerance   . Left kidney mass   . Low back pain    chronic  . Metastatic non-small cell lung cancer (HCC)    adenocarcinoma, LUL, bone metastasis in 09/2018  . Neuromuscular disorder ([facility]    nerve involvement  in back & upper back relative to hardware in neck   . Obesity   . OSA and COPD overlap syndrome ([place]) 02/25/2015   no cpap used pt denies sleep apnea  . Osteoarthritis   . Peptic ulcer disease   . Pneumonia not recent per pt  . 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 Sleep concern    states the study (2003 )was done at [facility] it failed & he was told to return & he never did. Pt.  states he has been found by prev. hosp. staff that he has to be told when to breathe & his wife does the same.   Marland Kitchen Spinal fracture    hx of traumatic in Jun 04, 2007 after falling off the roof  . Tinnitus, bilateral   . Vitamin D deficiency     ALLERGIES:  is allergic to codeine; meloxicam; morphine and related; nsaids; oxycodone; crestor [rosuvastatin]; cymbalta [duloxetine hcl]; dilaudid [hydromorphone hcl]; effexor [venlafaxine]; gluten meal; topamax [topiramate]; tramadol hcl; trazodone and nefazodone; adhesive [tape]; penicillins; and sulfa antibiotics.  MEDICATIONS:  Current Outpatient Medications  Medication Sig Dispense Refill  . acetaminophen (TYLENOL) 500 MG tablet Take 1 tablet (500 mg total) by mouth every 6 (six) hours as needed. 30 tablet 0  . albuterol (VENTOLIN HFA) 108 (90 Base) MCG/ACT inhaler Inhale 2 puffs into the lungs every 4 (four) hours as needed for wheezing or shortness of breath. 18 g 1  . Alpha-Lipoic Acid 200 MG CAPS Take by mouth 2 (two) times daily.    . Ascorbic Acid (VITAMIN C) 1000 MG tablet Take 1,000 mg by mouth 2 (two) times daily.    . Blood Glucose Monitoring Suppl (FREESTYLE FREEDOM LITE) w/Device KIT Check blood sugar 1 time a day. [id number] 1 each 0  . budesonide-formoterol (SYMBICORT) 160-4.5 MCG/ACT inhaler Use twice daily, wash your mouth afterwards to avoid yeast. 1 Inhaler 12  . Cholecalciferol (VITAMIN D3) 5000 units CAPS Take 5,000 Units by mouth 2 (two) times daily.    . CHOLINE PO Take 600 mg by mouth daily.    Marland Kitchen CINNAMON PO Take by mouth 2 (two) times daily.    . diazepam (VALIUM) 5 MG tablet Take 1/2 to 1 tablet 2 to 3 x /day as needed for Anxiety 270 tablet 0  . DOXYLAMINE SUCCINATE PO Take 0.5 tablets by mouth at bedtime.     Marland Kitchen ezetimibe (ZETIA) 10 MG tablet Take 1 tablet (10 mg total) by mouth daily. 90 tablet 3  . folic acid (FOLVITE) 1 MG tablet Take 1 tablet (1 mg total) by mouth daily. 30 tablet 4  . ipratropium (ATROVENT) 0.03 %  nasal spray Place 1-2 sprays into both nostrils daily as needed (for allergies.). 30 mL 3  . lidocaine-prilocaine (EMLA) cream Apply 1 application topically as needed. 30 g 0  . magnesium oxide (MAG-OX) 400 MG tablet Take 1,200 mg by mouth as needed.    Marland Kitchen MELATONIN PO Take 30 mg by mouth at bedtime.    . Multiple Vitamins-Minerals (MULTIVITAMIN ADULT PO) Take by mouth daily.    . nitroGLYCERIN (NITROSTAT) 0.4 MG SL tablet Dissolve 1 tablet under tongue every 3 to 5 minutes if needed for Chest Pain 50 tablet 3  . omega-3 acid ethyl esters (LOVAZA) 1 g capsule Take 2 capsules (2 g total) by mouth 2 (two) times daily. 360 capsule 3  . OVER THE COUNTER MEDICATION Taking OTC  Iron 1 tablet daily.    . prochlorperazine (COMPAZINE) 10 MG tablet Take 1 tablet (10 mg total) by mouth every 6 (six) hours as needed for nausea or vomiting. 30 tablet 0  . SUPER B COMPLEX/C PO Take by mouth daily.    . tamsulosin (FLOMAX) 0.4 MG CAPS capsule Take 1 capsule Daily for for Prostate 90 capsule 3  . tetrahydrozoline-zinc (VISINE-AC) 0.05-0.25 % ophthalmic solution Place 1-2 drops into both eyes 3 (three) times daily as needed (for redness/irritation.).    Marland Kitchen triamcinolone (NASACORT) 55 MCG/ACT AERO nasal inhaler Place 2 sprays into the nose at bedtime. 1 Inhaler 3  . Triamcinolone Acetonide (NASAL ALLERGY 24 HOUR NA) SMARTSIG:2 Spray(s) Both Nares Every Night    . triamcinolone ointment (KENALOG) 0.1 % Apply 1 application topically 2 (two) times daily. 80 g 1  . Turmeric 500 MG TABS Take by mouth. Takes 3 in the morning and 3 in the evening    . XARELTO 20 MG TABS tablet TAKE 1 TABLET BY MOUTH EVERY DAY WITH SUPPER 30 tablet 2  . Zinc 50 MG TABS Take by mouth daily.     No current facility-administered medications for this visit.    SURGICAL HISTORY:  Past Surgical History:  Procedure Laterality Date  . BACK SURGERY     x3- last surgery lumbar- 1998- / fusion   . blepheroplasty     both eyesx2  . C5-T6  posterior fusion     with [place] and radius screws  . [place], 2008 & 2012 multiple stents  total 4 times  . cataracts     cataracts removed- /w IOL - both eyes   . CERVICAL SPINE SURGERY    . COLONOSCOPY    . ESOPHAGEAL MANOMETRY    . HARDWARE REMOVAL  02/20/2012   Procedure: HARDWARE REMOVAL;  Surgeon: [name], MD;  Location: [facility];  Service: Neurosurgery;  Laterality: N/A;  Hardware Removal  . HIATAL HERNIA REPAIR  1979, spring 2009  . IR IMAGING GUIDED PORT INSERTION  10/29/2018  . LAPAROSCOPIC CHOLECYSTECTOMY     & IOC  . multiple percutaneous coronary interventions    . NASAL SINUS SURGERY     x2, sees Dr. [name], still having problems, states he uses Benadryl PRN- up to 5 times per day   . right temporal artery biopsy    . right wrist plate insertion  [id number]  . ROBOTIC ASSITED PARTIAL NEPHRECTOMY Left 03/02/2018   Procedure: XI ROBOTIC ASSITED LEFT PARTIAL NEPHRECTOMY WITH LYSIS OF ADHESIONS X30 MINUTES.;  Surgeon: Ardis Hughs, MD;  Location: [facility];  Service: Urology;  Laterality: Left;  CLAMP TIME FOR KIDNEY 1053-1110 TOTAL 18MINUTES  . Rotator cuff surgery Right   . SKIN BIOPSY Right 10/31/2017   Chest  . TRANSURETHRAL RESECTION OF BLADDER TUMOR N/A 11/03/2017   Procedure: cystoscopy;  Surgeon: Kathie Rhodes, MD;  Location: [facility];  Service: Urology;  Laterality: N/A;  . UMBILICAL HERNIA REPAIR    . UPPER GI ENDOSCOPY    . VENTRAL HERNIA REPAIR N/A 03/02/2018   Procedure: LAPAROSCOPIC REPAIR OF INCARCERATED INCISIONAL HERNIA WITH LYSIS OF ADHESIONS;  Surgeon: Michael Boston, MD;  Location: [facility];  Service: General;  Laterality: N/A;    REVIEW OF SYSTEMS:   Review of Systems  Constitutional: Positive for fatigue and decreased appetite. Negative for chills,  fever and unexpected weight change.  HENT: Negative for mouth sores, nosebleeds, sore throat and trouble swallowing.  Eyes: Negative for eye problems and icterus.  Respiratory:  Positive for baseline shortness of breath and cough. Negative for  hemoptysis and wheezing.   Cardiovascular: Positive for mild bilateral lower extremity swelling. Negative for chest pain. Gastrointestinal: Positive for mild nausea. Negative for abdominal pain, constipation, diarrhea, and vomiting.  Genitourinary: Negative for bladder incontinence, difficulty urinating, dysuria, frequency and hematuria.   Musculoskeletal: Negative for back pain, gait problem, neck pain and neck stiffness.  Skin: Negative for itching and rash.  Neurological: Negative for dizziness, extremity weakness, gait problem, headaches, light-headedness and seizures.  Hematological: Negative for adenopathy. Does not bruise/bleed easily.  Psychiatric/Behavioral: Negative for confusion, depression and sleep disturbance. The patient is not nervous/anxious.     PHYSICAL EXAMINATION:  There were no vitals taken for this visit.  ECOG PERFORMANCE STATUS: 1 - Symptomatic but completely ambulatory  Physical Exam  Constitutional: Oriented to person, place, and time and well-developed, well-nourished, and in no distress.  HENT:  Head: Normocephalic and atraumatic.  Mouth/Throat: Oropharynx is clear and moist. No oropharyngeal exudate.  Eyes: Conjunctivae are normal. Right eye exhibits no discharge. Left eye exhibits no discharge. No scleral icterus.  Neck: Normal range of motion. Neck supple.  Cardiovascular: Normal rate, regular rhythm, normal heart sounds and intact distal pulses.   Pulmonary/Chest: Effort normal and breath sounds normal. No respiratory distress. No wheezes. No rales.  Abdominal: Soft. Bowel sounds are normal. Exhibits no distension and no mass. There is no tenderness.  Musculoskeletal: Very mild bilateral lower extremity edema, non-pitting. Normal range of motion.  Lymphadenopathy:    No cervical adenopathy.  Neurological: Alert and oriented to person, place, and time. Exhibits normal muscle tone. Gait  normal. Coordination normal.  Skin: Skin is warm and dry. No rash noted. Not diaphoretic. No erythema. No pallor.  Psychiatric: Mood, memory and judgment normal.  Vitals reviewed.  LABORATORY DATA: Lab Results  Component Value Date   WBC 4.5 06/27/2019   HGB 13.3 06/27/2019   HCT 39.7 06/27/2019   MCV 98.0 06/27/2019   PLT 147 (L) 06/27/2019      Chemistry      Component Value Date/Time   NA 136 06/27/2019 1051   K 4.2 06/27/2019 1051   CL 102 06/27/2019 1051   CO2 25 06/27/2019 1051   BUN 30 (H) 06/27/2019 1051   CREATININE 1.15 06/27/2019 1051   CREATININE 1.10 11/28/2018 0959      Component Value Date/Time   CALCIUM 8.8 (L) 06/27/2019 1051   ALKPHOS 94 06/27/2019 1051   AST 28 06/27/2019 1051   ALT 23 06/27/2019 1051   BILITOT 0.5 06/27/2019 1051       RADIOGRAPHIC STUDIES:  CT ABDOMEN PELVIS W WO CONTRAST  Result Date: 06/25/2019 CLINICAL DATA:  Non-small cell lung cancer, restaging EXAM: CT CHEST WITH CONTRAST CT ABDOMEN AND PELVIS WITH AND WITHOUT CONTRAST TECHNIQUE: Multidetector CT imaging of the chest was performed during intravenous contrast administration. Multidetector CT imaging of the abdomen and pelvis was performed following the standard protocol before and during bolus administration of intravenous contrast. CONTRAST:  131m OMNIPAQUE IOHEXOL 300 MG/ML  SOLN COMPARISON:  04/25/2019 FINDINGS: CT CHEST FINDINGS Cardiovascular: There is asymmetric low-attenuation within the right subclavian vein, image 26/11. The heart size appears within normal limits. Aortic atherosclerosis. Lad, left circumflex and RCA coronary artery calcifications. No pericardial effusion. Mediastinum/Nodes: Normal appearance of the thyroid gland. The trachea appears patent and is midline. Normal appearance of the esophagus. No enlarged mediastinal or hilar lymph nodes. Lungs/Pleura: No  pleural effusion identified. Spiculated nodule within the anterior left upper lobe measures 1.3 cm, image  50/15. Unchanged. No new pulmonary nodule or mass identified. Musculoskeletal: Postop change identified within the cervical spine from previous posterior hardware fusion. Wedge deformities involving the upper thoracic spine, unchanged. Chronic healed fracture involving the proximal body of sternum. Sclerotic lesion involving the left glenoid is stable from previous study. CT ABDOMEN AND PELVIS FINDINGS Hepatobiliary: No focal liver abnormality is seen. Status post cholecystectomy. No biliary dilatation. Pancreas: Unremarkable. No pancreatic ductal dilatation or surrounding inflammatory changes. Spleen: Normal in size without focal abnormality. Adrenals/Urinary Tract: Normal adrenal glands. Unremarkable appearance of the right kidney. Postoperative changes around the upper pole of left kidney are again identified and appear unchanged. 9 mm intermediate attenuating structure arising from posterior cortex of the left kidney is similar to previous exam. This is too small to reliably characterize. Nonobstructing stone is noted within the inferior pole collecting system of the left kidney measuring 4 mm. Unremarkable appearance of the urinary bladder. Stomach/Bowel: Stomach is within normal limits. Appendix not confidently identified on today's exam. No evidence of bowel wall thickening, distention, or inflammatory changes. Vascular/Lymphatic: Aortic atherosclerosis. Ectasias of the infrarenal abdominal aorta measures 3 cm. No abdominal no pelvic adenopathy. Reproductive: Mild prostate gland enlargement. Other: No free fluid or fluid collection Musculoskeletal: Extensive postop change identified from posterior hardware fusion at L3 through L5. Bilateral iliac bones sclerotic lesions are stable from previous exam. Within the left iliac bone there is a slightly expansile mixed lucent and sclerotic lesion measuring 3.8 cm, unchanged from previous exam. IMPRESSION: 1. Stable appearance of spiculated nodule within the anterior  left upper lobe. Predominantly sclerotic metastasis involving the left glenoid and bilateral iliac bones are also unchanged. The dominant lesion is in the left iliac bone and appears slightly expansile with both sclerotic and lucent components. 2. Aortic atherosclerosis. Multi vessel coronary artery calcifications noted. 3. Asymmetric low-attenuation within the right subclavian vein is identified. Cannot rule out deep venous thrombosis. Consider further investigation with right upper extremity venous Doppler ultrasound. Aortic Atherosclerosis (ICD10-I70.0). These results were called by telephone at the time of interpretation on 06/25/2019 at 10:36 am to provider [facility] , who verbally acknowledged these results. Electronically Signed   By: Kerby Moors M.D.   On: 06/25/2019 10:37   CT Chest W Contrast  Result Date: 06/25/2019 CLINICAL DATA:  Non-small cell lung cancer, restaging EXAM: CT CHEST WITH CONTRAST CT ABDOMEN AND PELVIS WITH AND WITHOUT CONTRAST TECHNIQUE: Multidetector CT imaging of the chest was performed during intravenous contrast administration. Multidetector CT imaging of the abdomen and pelvis was performed following the standard protocol before and during bolus administration of intravenous contrast. CONTRAST:  133m OMNIPAQUE IOHEXOL 300 MG/ML  SOLN COMPARISON:  04/25/2019 FINDINGS: CT CHEST FINDINGS Cardiovascular: There is asymmetric low-attenuation within the right subclavian vein, image 26/11. The heart size appears within normal limits. Aortic atherosclerosis. Lad, left circumflex and RCA coronary artery calcifications. No pericardial effusion. Mediastinum/Nodes: Normal appearance of the thyroid gland. The trachea appears patent and is midline. Normal appearance of the esophagus. No enlarged mediastinal or hilar lymph nodes. Lungs/Pleura: No pleural effusion identified. Spiculated nodule within the anterior left upper lobe measures 1.3 cm, image 50/15. Unchanged. No new pulmonary  nodule or mass identified. Musculoskeletal: Postop change identified within the cervical spine from previous posterior hardware fusion. Wedge deformities involving the upper thoracic spine, unchanged. Chronic healed fracture involving the proximal body of sternum. Sclerotic lesion involving the left glenoid  is stable from previous study. CT ABDOMEN AND PELVIS FINDINGS Hepatobiliary: No focal liver abnormality is seen. Status post cholecystectomy. No biliary dilatation. Pancreas: Unremarkable. No pancreatic ductal dilatation or surrounding inflammatory changes. Spleen: Normal in size without focal abnormality. Adrenals/Urinary Tract: Normal adrenal glands. Unremarkable appearance of the right kidney. Postoperative changes around the upper pole of left kidney are again identified and appear unchanged. 9 mm intermediate attenuating structure arising from posterior cortex of the left kidney is similar to previous exam. This is too small to reliably characterize. Nonobstructing stone is noted within the inferior pole collecting system of the left kidney measuring 4 mm. Unremarkable appearance of the urinary bladder. Stomach/Bowel: Stomach is within normal limits. Appendix not confidently identified on today's exam. No evidence of bowel wall thickening, distention, or inflammatory changes. Vascular/Lymphatic: Aortic atherosclerosis. Ectasias of the infrarenal abdominal aorta measures 3 cm. No abdominal no pelvic adenopathy. Reproductive: Mild prostate gland enlargement. Other: No free fluid or fluid collection Musculoskeletal: Extensive postop change identified from posterior hardware fusion at L3 through L5. Bilateral iliac bones sclerotic lesions are stable from previous exam. Within the left iliac bone there is a slightly expansile mixed lucent and sclerotic lesion measuring 3.8 cm, unchanged from previous exam. IMPRESSION: 1. Stable appearance of spiculated nodule within the anterior left upper lobe. Predominantly  sclerotic metastasis involving the left glenoid and bilateral iliac bones are also unchanged. The dominant lesion is in the left iliac bone and appears slightly expansile with both sclerotic and lucent components. 2. Aortic atherosclerosis. Multi vessel coronary artery calcifications noted. 3. Asymmetric low-attenuation within the right subclavian vein is identified. Cannot rule out deep venous thrombosis. Consider further investigation with right upper extremity venous Doppler ultrasound. Aortic Atherosclerosis (ICD10-I70.0). These results were called by telephone at the time of interpretation on 06/25/2019 at 10:36 am to provider [facility] , who verbally acknowledged these results. Electronically Signed   By: Kerby Moors M.D.   On: 06/25/2019 10:37     ASSESSMENT/PLAN:  This is a very pleasant 74 year old Caucasian male diagnosed with stage IV non-small cell lung cancer, adenocarcinoma. He presented with a left upper lobe lung nodule in addition to metastatic bone disease. He was diagnosed in April 2020. His PDL 1 expression is negative and he has no actionable mutations. He also has a history of renal cell carcinoma. He is status post wedge resection to the left kidney which was performed in September 2019.  The patient is currently undergoing palliative systemic chemotherapy with carboplatin for an AUC of 5, Alimta500 mg/m, and Keytruda 200 mg IV every 3 weeks. The patient is status post 13 cycles of treatment. Starting from cycle #5, the patient has been on maintenance Alimta and Keytruda.He tolerated his treatment well without any adverse effects except for some fatigue.  The patient was seen with Dr. Julien Nordmann. Labs were reviewed. Recommend that he proceed with cycle #14 today as scheduled.   We will see him back for a follow up visit in 3 weeks for evaluation before starting cycle #15.   For the incidental finding of pulmonary embolism, he is currently on Xarelto 20 mg p.o. daily.  He will continue with the same treatment for now. Denies any bleeding or bruising.   He will elevate his lower extremities and use compression stockings for his mild bilateral lower extremity swelling. No calf pain, tenderness, or asymmetry.   The patient was advised to call immediately if he has any concerning symptoms in the interval. The patient voices understanding of  current disease status and treatment options and is in agreement with the current care plan. All questions were answered. The patient knows to call the clinic with any problems, questions or concerns. We can certainly see the patient much sooner if necessary   No orders of the defined types were placed in this encounter.    Inmer Nix L Dieter Hane, PA-C 07/17/19  ADDENDUM: Hematology/Oncology Attending: I had a face-to-face encounter with the patient today.  I recommended his care plan.  This is a very pleasant 74 years old white male with stage IV non-small cell lung cancer, adenocarcinoma status post induction systemic chemotherapy with carboplatin, Alimta and Keytruda for 4 cycles and he is currently undergoing maintenance treatment with Alimta and Keytruda starting from cycle #5. The patient continues to tolerate his treatment well with no concerning adverse effects. I recommended for him to proceed with cycle #14 of his treatment today as planned. We will see him back for follow-up visit in 3 weeks for evaluation before starting cycle #15. The patient will continue his current treatment with Xarelto for the incidental pulmonary embolism and suspicious deep venous thrombosis seen on the last scan. He was advised to call immediately if he has any other concerning symptoms in the interval.  Disclaimer: This note was dictated with voice recognition software. Similar sounding words can inadvertently be transcribed and may be missed upon review. Eilleen Kempf, MD 07/18/19

## 2019-07-18 ENCOUNTER — Inpatient Hospital Stay: Payer: Medicare Other

## 2019-07-18 ENCOUNTER — Inpatient Hospital Stay: Payer: Medicare Other | Attending: Internal Medicine | Admitting: Physician Assistant

## 2019-07-18 ENCOUNTER — Encounter: Payer: Self-pay | Admitting: Physician Assistant

## 2019-07-18 ENCOUNTER — Ambulatory Visit: Payer: Medicare Other

## 2019-07-18 ENCOUNTER — Other Ambulatory Visit: Payer: Self-pay

## 2019-07-18 VITALS — BP 143/89 | HR 91 | Temp 99.3°F | Resp 19 | Ht 67.0 in | Wt 206.0 lb

## 2019-07-18 DIAGNOSIS — C3412 Malignant neoplasm of upper lobe, left bronchus or lung: Secondary | ICD-10-CM | POA: Diagnosis not present

## 2019-07-18 DIAGNOSIS — C3492 Malignant neoplasm of unspecified part of left bronchus or lung: Secondary | ICD-10-CM

## 2019-07-18 DIAGNOSIS — C7951 Secondary malignant neoplasm of bone: Secondary | ICD-10-CM | POA: Insufficient documentation

## 2019-07-18 DIAGNOSIS — R5382 Chronic fatigue, unspecified: Secondary | ICD-10-CM

## 2019-07-18 DIAGNOSIS — Z5111 Encounter for antineoplastic chemotherapy: Secondary | ICD-10-CM

## 2019-07-18 DIAGNOSIS — Z5112 Encounter for antineoplastic immunotherapy: Secondary | ICD-10-CM | POA: Diagnosis not present

## 2019-07-18 DIAGNOSIS — Z79899 Other long term (current) drug therapy: Secondary | ICD-10-CM | POA: Insufficient documentation

## 2019-07-18 DIAGNOSIS — Z95828 Presence of other vascular implants and grafts: Secondary | ICD-10-CM

## 2019-07-18 DIAGNOSIS — I2510 Atherosclerotic heart disease of native coronary artery without angina pectoris: Secondary | ICD-10-CM

## 2019-07-18 LAB — CMP (CANCER CENTER ONLY)
ALT: 26 U/L (ref 0–44)
AST: 29 U/L (ref 15–41)
Albumin: 3.2 g/dL — ABNORMAL LOW (ref 3.5–5.0)
Alkaline Phosphatase: 141 U/L — ABNORMAL HIGH (ref 38–126)
Anion gap: 10 (ref 5–15)
BUN: 32 mg/dL — ABNORMAL HIGH (ref 8–23)
CO2: 25 mmol/L (ref 22–32)
Calcium: 9.2 mg/dL (ref 8.9–10.3)
Chloride: 98 mmol/L (ref 98–111)
Creatinine: 1.24 mg/dL (ref 0.61–1.24)
GFR, Est AFR Am: >60 mL/min (ref >60)
GFR, Estimated: 57 mL/min — ABNORMAL LOW (ref >60)
Glucose, Bld: 106 mg/dL — ABNORMAL HIGH (ref 70–99)
Potassium: 4 mmol/L (ref 3.5–5.1)
Sodium: 133 mmol/L — ABNORMAL LOW (ref 135–145)
Total Bilirubin: 0.4 mg/dL (ref 0.3–1.2)
Total Protein: 7.4 g/dL (ref 6.5–8.1)

## 2019-07-18 LAB — CBC WITH DIFFERENTIAL (CANCER CENTER ONLY)
Abs Immature Granulocytes: 0.03 10*3/uL (ref 0.00–0.07)
Basophils Absolute: 0 10*3/uL (ref 0.0–0.1)
Basophils Relative: 1 %
Eosinophils Absolute: 0.1 10*3/uL (ref 0.0–0.5)
Eosinophils Relative: 1 %
HCT: 39.5 % (ref 39.0–52.0)
Hemoglobin: 13.3 g/dL (ref 13.0–17.0)
Immature Granulocytes: 1 %
Lymphocytes Relative: 22 %
Lymphs Abs: 1.2 10*3/uL (ref 0.7–4.0)
MCH: 33.2 pg (ref 26.0–34.0)
MCHC: 33.7 g/dL (ref 30.0–36.0)
MCV: 98.5 fL (ref 80.0–100.0)
Monocytes Absolute: 0.6 10*3/uL (ref 0.1–1.0)
Monocytes Relative: 11 %
Neutro Abs: 3.5 10*3/uL (ref 1.7–7.7)
Neutrophils Relative %: 64 %
Platelet Count: 177 10*3/uL (ref 150–400)
RBC: 4.01 MIL/uL — ABNORMAL LOW (ref 4.22–5.81)
RDW: 14.5 % (ref 11.5–15.5)
WBC Count: 5.5 10*3/uL (ref 4.0–10.5)
nRBC: 0 % (ref 0.0–0.2)

## 2019-07-18 LAB — TSH: TSH: 1.229 u[IU]/mL (ref 0.320–4.118)

## 2019-07-18 MED ORDER — PROCHLORPERAZINE MALEATE 10 MG PO TABS
10.0000 mg | ORAL_TABLET | Freq: Once | ORAL | Status: AC
  Administered 2019-07-18: 10 mg via ORAL

## 2019-07-18 MED ORDER — SODIUM CHLORIDE 0.9 % IV SOLN
500.0000 mg/m2 | Freq: Once | INTRAVENOUS | Status: AC
  Administered 2019-07-18: 1000 mg via INTRAVENOUS
  Filled 2019-07-18: qty 40

## 2019-07-18 MED ORDER — SODIUM CHLORIDE 0.9% FLUSH
10.0000 mL | Freq: Once | INTRAVENOUS | Status: AC
  Administered 2019-07-18: 10 mL
  Filled 2019-07-18: qty 10

## 2019-07-18 MED ORDER — SODIUM CHLORIDE 0.9 % IV SOLN
Freq: Once | INTRAVENOUS | Status: AC
  Filled 2019-07-18: qty 250

## 2019-07-18 MED ORDER — SODIUM CHLORIDE 0.9 % IV SOLN
200.0000 mg | Freq: Once | INTRAVENOUS | Status: AC
  Administered 2019-07-18: 200 mg via INTRAVENOUS
  Filled 2019-07-18: qty 8

## 2019-07-18 MED ORDER — PROCHLORPERAZINE MALEATE 10 MG PO TABS
ORAL_TABLET | ORAL | Status: AC
  Filled 2019-07-18: qty 1

## 2019-07-18 MED ORDER — SODIUM CHLORIDE 0.9% FLUSH
10.0000 mL | INTRAVENOUS | Status: DC | PRN
  Administered 2019-07-18: 10 mL
  Filled 2019-07-18: qty 10

## 2019-07-18 MED ORDER — HEPARIN SOD (PORK) LOCK FLUSH 100 UNIT/ML IV SOLN
500.0000 [IU] | Freq: Once | INTRAVENOUS | Status: AC | PRN
  Administered 2019-07-18: 500 [IU]
  Filled 2019-07-18: qty 5

## 2019-07-18 NOTE — Patient Instructions (Signed)
[facility] Discharge Instructions for Patients Receiving Chemotherapy  Today you received the following chemotherapy agents Alimta, Keytruda. To help prevent nausea and vomiting after your treatment, we encourage you to take your nausea medication    If you develop nausea and vomiting that is not controlled by your nausea medication, call the clinic.   BELOW ARE SYMPTOMS THAT SHOULD BE REPORTED IMMEDIATELY:  *FEVER GREATER THAN 100.5 F  *CHILLS WITH OR WITHOUT FEVER  NAUSEA AND VOMITING THAT IS NOT CONTROLLED WITH YOUR NAUSEA MEDICATION  *UNUSUAL SHORTNESS OF BREATH  *UNUSUAL BRUISING OR BLEEDING  TENDERNESS IN MOUTH AND THROAT WITH OR WITHOUT PRESENCE OF ULCERS  *URINARY PROBLEMS  *BOWEL PROBLEMS  UNUSUAL RASH Items with * indicate a potential emergency and should be followed up as soon as possible.  Feel free to call the clinic should you have any questions or concerns. The clinic phone number is [phone].  Please show the [facility] at check-in to the Emergency Department and triage nurse.

## 2019-07-19 ENCOUNTER — Telehealth: Payer: Self-pay | Admitting: Internal Medicine

## 2019-07-19 NOTE — Telephone Encounter (Signed)
Scheduled per los. Called and left msg. Mailed printout  °

## 2019-07-25 ENCOUNTER — Ambulatory Visit: Payer: Medicare Other | Attending: Internal Medicine

## 2019-07-25 DIAGNOSIS — Z23 Encounter for immunization: Secondary | ICD-10-CM | POA: Insufficient documentation

## 2019-07-25 NOTE — Progress Notes (Signed)
   Covid-19 Vaccination Clinic  Name:  [name]    MRN: [id number] DOB: February 02, 1946  07/25/2019  [name] was observed post Covid-19 immunization for 15 minutes without incidence. He was provided with Vaccine Information Sheet and instruction to access the V-Safe system.   [name] was instructed to call 911 with any severe reactions post vaccine: Marland Kitchen Difficulty breathing  . Swelling of your face and throat  . A fast heartbeat  . A bad rash all over your body  . Dizziness and weakness    Immunizations Administered    Name Date Dose VIS Date Route   Pfizer COVID-19 Vaccine 07/25/2019  1:01 PM 0.3 mL 05/24/2019 Intramuscular   Manufacturer: [place]   Lot: [id number]   [place]: [id number]

## 2019-08-08 ENCOUNTER — Inpatient Hospital Stay (HOSPITAL_BASED_OUTPATIENT_CLINIC_OR_DEPARTMENT_OTHER): Payer: Medicare Other | Admitting: Internal Medicine

## 2019-08-08 ENCOUNTER — Encounter: Payer: Self-pay | Admitting: Internal Medicine

## 2019-08-08 ENCOUNTER — Other Ambulatory Visit: Payer: Self-pay

## 2019-08-08 ENCOUNTER — Inpatient Hospital Stay: Payer: Medicare Other

## 2019-08-08 ENCOUNTER — Other Ambulatory Visit: Payer: Self-pay | Admitting: Internal Medicine

## 2019-08-08 DIAGNOSIS — C3492 Malignant neoplasm of unspecified part of left bronchus or lung: Secondary | ICD-10-CM

## 2019-08-08 DIAGNOSIS — Z79899 Other long term (current) drug therapy: Secondary | ICD-10-CM | POA: Diagnosis not present

## 2019-08-08 DIAGNOSIS — Z95828 Presence of other vascular implants and grafts: Secondary | ICD-10-CM

## 2019-08-08 DIAGNOSIS — C3490 Malignant neoplasm of unspecified part of unspecified bronchus or lung: Secondary | ICD-10-CM | POA: Diagnosis not present

## 2019-08-08 DIAGNOSIS — R5382 Chronic fatigue, unspecified: Secondary | ICD-10-CM

## 2019-08-08 DIAGNOSIS — I2510 Atherosclerotic heart disease of native coronary artery without angina pectoris: Secondary | ICD-10-CM

## 2019-08-08 DIAGNOSIS — C3412 Malignant neoplasm of upper lobe, left bronchus or lung: Secondary | ICD-10-CM | POA: Diagnosis not present

## 2019-08-08 DIAGNOSIS — Z5112 Encounter for antineoplastic immunotherapy: Secondary | ICD-10-CM | POA: Diagnosis not present

## 2019-08-08 DIAGNOSIS — Z5111 Encounter for antineoplastic chemotherapy: Secondary | ICD-10-CM | POA: Diagnosis not present

## 2019-08-08 DIAGNOSIS — C7951 Secondary malignant neoplasm of bone: Secondary | ICD-10-CM | POA: Diagnosis not present

## 2019-08-08 LAB — CBC WITH DIFFERENTIAL (CANCER CENTER ONLY)
Abs Immature Granulocytes: 0.02 10*3/uL (ref 0.00–0.07)
Basophils Absolute: 0 10*3/uL (ref 0.0–0.1)
Basophils Relative: 1 %
Eosinophils Absolute: 0.1 10*3/uL (ref 0.0–0.5)
Eosinophils Relative: 3 %
HCT: 39 % (ref 39.0–52.0)
Hemoglobin: 13.2 g/dL (ref 13.0–17.0)
Immature Granulocytes: 1 %
Lymphocytes Relative: 32 %
Lymphs Abs: 1.2 10*3/uL (ref 0.7–4.0)
MCH: 32.2 pg (ref 26.0–34.0)
MCHC: 33.8 g/dL (ref 30.0–36.0)
MCV: 95.1 fL (ref 80.0–100.0)
Monocytes Absolute: 0.4 10*3/uL (ref 0.1–1.0)
Monocytes Relative: 10 %
Neutro Abs: 2.1 10*3/uL (ref 1.7–7.7)
Neutrophils Relative %: 53 %
Platelet Count: 149 10*3/uL — ABNORMAL LOW (ref 150–400)
RBC: 4.1 MIL/uL — ABNORMAL LOW (ref 4.22–5.81)
RDW: 14.8 % (ref 11.5–15.5)
WBC Count: 3.9 10*3/uL — ABNORMAL LOW (ref 4.0–10.5)
nRBC: 0 % (ref 0.0–0.2)

## 2019-08-08 LAB — CMP (CANCER CENTER ONLY)
ALT: 28 U/L (ref 0–44)
AST: 31 U/L (ref 15–41)
Albumin: 3.1 g/dL — ABNORMAL LOW (ref 3.5–5.0)
Alkaline Phosphatase: 108 U/L (ref 38–126)
Anion gap: 10 (ref 5–15)
BUN: 26 mg/dL — ABNORMAL HIGH (ref 8–23)
CO2: 24 mmol/L (ref 22–32)
Calcium: 8.9 mg/dL (ref 8.9–10.3)
Chloride: 103 mmol/L (ref 98–111)
Creatinine: 1.1 mg/dL (ref 0.61–1.24)
GFR, Est AFR Am: >60 mL/min (ref >60)
GFR, Estimated: >60 mL/min (ref >60)
Glucose, Bld: 107 mg/dL — ABNORMAL HIGH (ref 70–99)
Potassium: 4 mmol/L (ref 3.5–5.1)
Sodium: 137 mmol/L (ref 135–145)
Total Bilirubin: 0.4 mg/dL (ref 0.3–1.2)
Total Protein: 6.9 g/dL (ref 6.5–8.1)

## 2019-08-08 LAB — TSH: TSH: 1.22 u[IU]/mL (ref 0.320–4.118)

## 2019-08-08 MED ORDER — HEPARIN SOD (PORK) LOCK FLUSH 100 UNIT/ML IV SOLN
500.0000 [IU] | Freq: Once | INTRAVENOUS | Status: AC | PRN
  Administered 2019-08-08: 500 [IU]
  Filled 2019-08-08: qty 5

## 2019-08-08 MED ORDER — SODIUM CHLORIDE 0.9% FLUSH
10.0000 mL | INTRAVENOUS | Status: DC | PRN
  Administered 2019-08-08: 10 mL
  Filled 2019-08-08: qty 10

## 2019-08-08 MED ORDER — SODIUM CHLORIDE 0.9 % IV SOLN
500.0000 mg/m2 | Freq: Once | INTRAVENOUS | Status: AC
  Administered 2019-08-08: 1000 mg via INTRAVENOUS
  Filled 2019-08-08: qty 40

## 2019-08-08 MED ORDER — PROCHLORPERAZINE MALEATE 10 MG PO TABS
ORAL_TABLET | ORAL | Status: AC
  Filled 2019-08-08: qty 1

## 2019-08-08 MED ORDER — SODIUM CHLORIDE 0.9 % IV SOLN
200.0000 mg | Freq: Once | INTRAVENOUS | Status: AC
  Administered 2019-08-08: 200 mg via INTRAVENOUS
  Filled 2019-08-08: qty 8

## 2019-08-08 MED ORDER — SODIUM CHLORIDE 0.9 % IV SOLN
Freq: Once | INTRAVENOUS | Status: AC
  Filled 2019-08-08: qty 250

## 2019-08-08 MED ORDER — CYANOCOBALAMIN 1000 MCG/ML IJ SOLN
1000.0000 ug | Freq: Once | INTRAMUSCULAR | Status: AC
  Administered 2019-08-08: 1000 ug via INTRAMUSCULAR

## 2019-08-08 MED ORDER — SODIUM CHLORIDE 0.9% FLUSH
10.0000 mL | Freq: Once | INTRAVENOUS | Status: AC
  Administered 2019-08-08: 10 mL
  Filled 2019-08-08: qty 10

## 2019-08-08 MED ORDER — PROCHLORPERAZINE MALEATE 10 MG PO TABS
10.0000 mg | ORAL_TABLET | Freq: Once | ORAL | Status: AC
  Administered 2019-08-08: 10 mg via ORAL

## 2019-08-08 MED ORDER — CYANOCOBALAMIN 1000 MCG/ML IJ SOLN
INTRAMUSCULAR | Status: AC
  Filled 2019-08-08: qty 1

## 2019-08-08 NOTE — Patient Instructions (Signed)
[facility] Discharge Instructions for Patients Receiving Chemotherapy  Today you received the following chemotherapy agents: pembrolizumab and pemetrexed.  To help prevent nausea and vomiting after your treatment, we encourage you to take your nausea medication as directed.   If you develop nausea and vomiting that is not controlled by your nausea medication, call the clinic.   BELOW ARE SYMPTOMS THAT SHOULD BE REPORTED IMMEDIATELY:  *FEVER GREATER THAN 100.5 F  *CHILLS WITH OR WITHOUT FEVER  NAUSEA AND VOMITING THAT IS NOT CONTROLLED WITH YOUR NAUSEA MEDICATION  *UNUSUAL SHORTNESS OF BREATH  *UNUSUAL BRUISING OR BLEEDING  TENDERNESS IN MOUTH AND THROAT WITH OR WITHOUT PRESENCE OF ULCERS  *URINARY PROBLEMS  *BOWEL PROBLEMS  UNUSUAL RASH Items with * indicate a potential emergency and should be followed up as soon as possible.  Feel free to call the clinic should you have any questions or concerns. The clinic phone number is [phone].  Please show the [facility] at check-in to the Emergency Department and triage nurse.

## 2019-08-08 NOTE — Progress Notes (Signed)
[facility] [phone]   [phone]  OFFICE PROGRESS NOTE  [name], MD [address] [place] [address]  DIAGNOSIS:  1) Stage IV (T1c, N0, M1 C) non-small cell lung cancer, adenocarcinoma presented with left upper lobe lung nodule in addition to metastatic disease to the bone diagnosed in April 2020. 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   3) incidental finding of pulmonary embolism involving the right middle lobe pulmonary artery on CT scan on 12/18/2018.  Molecular studies by [facility]  STK11Splice Site SNV 0.8% Everolimus,Temsirolimus  [id number] 0.8% None (VUS), No (VUS)  NTRK3 L629L 1.5%  (Synonymous) No (Synonymous)  KRAS G12D 1.1% Binimetinib  CDKN2A H83Y 1.6% Abemaciclib, Palbociclib, Ribociclib  PDL1 TPS was 0%  PRIOR THERAPY: None  CURRENT THERAPY:  1) Systemic chemotherapy with carboplatin for AUC of 5, Alimta 500 mg/M2 and Keytruda 200 mg IV every 3 weeks.  First dose Oct 16, 2018.  Status post 14 cycles.  Starting from cycle #5 the patient will be on maintenance treatment with Alimta and Keytruda. 2) Xarelto initially 15 mg p.o. twice daily for 3 weeks followed by 20 mg p.o. daily.  He started the first dose of this treatment on 12/20/2018.  INTERVAL HISTORY: Darrell ViscontiSouthern 74y.o. male returns to the clinic today for follow-up visit.  The patient is feeling fine today with no concerning complaints except for fatigue and lack of stamina.  He denied having any chest pain, shortness of breath, cough or hemoptysis.  He denied having any current nausea, vomiting, diarrhea or constipation.  He denied having any fever or chills.  He has no weight loss or night sweats.  He has been tolerating his maintenance treatment with Alimta and Keytruda fairly well.  The patient is here today for evaluation before starting cycle  #15.  MEDICAL HISTORY: Past Medical History:  Diagnosis Date  . [place]inal pain ([facility]   . Anxiety    treated /w Xanax for mild depression , using 2 times per day, not PRN  . Asthma   . Benign prostatic hypertrophy   . CAD (coronary artery disease)    pt last left heart cath was in Nov 2008. EF was 55% on left ventriculogram. Circumflex was totally occluded after the 1st obtuse marginal with collaterals supplying the distal circumflex. LAD showed luminal irregularities. The stent in LAD was patent. The RCA showed luminal irregularities. The stent in th emid RCA and PDA were patent. There were no inverventions.   . Chest pain    from anxiety last time 1 month ago  . Chromophobe renal cell carcinoma ([facility] 02[place]   s/p left kidney wedge resection  . Chronic obstructive pulmonary disease (HCC)    asthma  . Cluster headache    relative to sinus problems none recnt  . Constipation   . Depression   . DM2 (diabetes mellitus, type 2) ([facility]    well with diet and exercise controlled  . GERD (gastroesophageal reflux disease)    hiatal hernia. Patient did have a Nissen fundoplication rare  . Heart murmur    heard 30 yrs ago  . History of blood transfusion    need for Bld. transfusion relative to taking NSAIDS  . HTN (hypertension)    pt. followed by [facility], last cardiac visit 2012, one yr. ago  . Hyperlipidemia   . Joint pain   .  Lactose intolerance   . Left kidney mass   . Low back pain    chronic  . Metastatic non-small cell lung cancer (HCC)    adenocarcinoma, LUL, bone metastasis in 09/2018  . Neuromuscular disorder ([place])    nerve involvement in back & upper back relative to hardware in neck   . Obesity   . OSA and COPD overlap syndrome ([employer]) 02/25/2015   no cpap used pt denies sleep apnea  . Osteoarthritis   . Peptic ulcer disease   . Pneumonia not recent per pt  . 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 Sleep concern     states the study (2003 )was done at [facility]., it failed & he was told to return & he never did. Pt. states he has been found by prev. hosp. staff that he has to be told when to breathe & his wife does the same.   Marland Kitchen Spinal fracture    hx of traumatic in Jun 04, 2007 after falling off the roof  . Tinnitus, bilateral   . Vitamin D deficiency     ALLERGIES:  is allergic to codeine; meloxicam; morphine and related; nsaids; oxycodone; crestor [rosuvastatin]; cymbalta [duloxetine hcl]; dilaudid [hydromorphone hcl]; effexor [venlafaxine]; gluten meal; topamax [topiramate]; tramadol hcl; trazodone and nefazodone; adhesive [tape]; penicillins; and sulfa antibiotics.  MEDICATIONS:  Current Outpatient Medications  Medication Sig Dispense Refill  . acetaminophen (TYLENOL) 500 MG tablet Take 1 tablet (500 mg total) by mouth every 6 (six) hours as needed. 30 tablet 0  . albuterol (VENTOLIN HFA) 108 (90 Base) MCG/ACT inhaler Inhale 2 puffs into the lungs every 4 (four) hours as needed for wheezing or shortness of breath. 18 g 1  . Alpha-Lipoic Acid 200 MG CAPS Take by mouth 2 (two) times daily.    . Ascorbic Acid (VITAMIN C) 1000 MG tablet Take 1,000 mg by mouth 2 (two) times daily.    . Blood Glucose Monitoring Suppl (FREESTYLE FREEDOM LITE) w/Device KIT Check blood sugar 1 time a day. DX-E11.21 1 each 0  . budesonide-formoterol (SYMBICORT) 160-4.5 MCG/ACT inhaler Use twice daily, wash your mouth afterwards to avoid yeast. 1 Inhaler 12  . Cholecalciferol (VITAMIN D3) 5000 units CAPS Take 5,000 Units by mouth 2 (two) times daily.    . CHOLINE PO Take 600 mg by mouth daily.    Marland Kitchen CINNAMON PO Take by mouth 2 (two) times daily.    . diazepam (VALIUM) 5 MG tablet Take 1/2 to 1 tablet 2 to 3 x /day as needed for Anxiety 270 tablet 0  . DOXYLAMINE SUCCINATE PO Take 0.5 tablets by mouth at bedtime.     Marland Kitchen ezetimibe (ZETIA) 10 MG tablet Take 1 tablet (10 mg total) by mouth daily. 90 tablet 3  . folic acid  (FOLVITE) 1 MG tablet Take 1 tablet (1 mg total) by mouth daily. 30 tablet 4  . ipratropium (ATROVENT) 0.03 % nasal spray Place 1-2 sprays into both nostrils daily as needed (for allergies.). 30 mL 3  . lidocaine-prilocaine (EMLA) cream Apply 1 application topically as needed. 30 g 0  . magnesium oxide (MAG-OX) 400 MG tablet Take 1,200 mg by mouth as needed.    Marland Kitchen MELATONIN PO Take 30 mg by mouth at bedtime.    . Multiple Vitamins-Minerals (MULTIVITAMIN ADULT PO) Take by mouth daily.    . nitroGLYCERIN (NITROSTAT) 0.4 MG SL tablet Dissolve 1 tablet under tongue every  3 to 5 minutes if needed for Chest Pain 50 tablet 3  . omega-3 acid ethyl esters (LOVAZA) 1 g capsule Take 2 capsules (2 g total) by mouth 2 (two) times daily. 360 capsule 3  . OVER THE COUNTER MEDICATION Taking OTC Iron 1 tablet daily.    . prochlorperazine (COMPAZINE) 10 MG tablet Take 1 tablet (10 mg total) by mouth every 6 (six) hours as needed for nausea or vomiting. 30 tablet 0  . SUPER B COMPLEX/C PO Take by mouth daily.    . tamsulosin (FLOMAX) 0.4 MG CAPS capsule Take 1 capsule Daily for for Prostate 90 capsule 3  . tetrahydrozoline-zinc (VISINE-AC) 0.05-0.25 % ophthalmic solution Place 1-2 drops into both eyes 3 (three) times daily as needed (for redness/irritation.).    Marland Kitchen triamcinolone (NASACORT) 55 MCG/ACT AERO nasal inhaler Place 2 sprays into the nose at bedtime. 1 Inhaler 3  . Triamcinolone Acetonide (NASAL ALLERGY 24 HOUR NA) SMARTSIG:2 Spray(s) Both Nares Every Night    . triamcinolone ointment (KENALOG) 0.1 % Apply 1 application topically 2 (two) times daily. 80 g 1  . Turmeric 500 MG TABS Take by mouth. Takes 3 in the morning and 3 in the evening    . XARELTO 20 MG TABS tablet TAKE 1 TABLET BY MOUTH EVERY DAY WITH SUPPER 30 tablet 2  . Zinc 50 MG TABS Take by mouth daily.     No current facility-administered medications for this visit.    SURGICAL HISTORY:  Past Surgical History:  Procedure Laterality Date  .  BACK SURGERY     x3- last surgery lumbar- 1998- / fusion   . blepheroplasty     both eyesx2  . C5-T6 posterior fusion     with [place] and radius screws  . [place], 2008 & 2012 multiple stents  total 4 times  . cataracts     cataracts removed- /w IOL - both eyes   . CERVICAL SPINE SURGERY    . COLONOSCOPY    . ESOPHAGEAL MANOMETRY    . HARDWARE REMOVAL  02/20/2012   Procedure: HARDWARE REMOVAL;  Surgeon: [name], MD;  Location: [facility];  Service: Neurosurgery;  Laterality: N/A;  Hardware Removal  . HIATAL HERNIA REPAIR  1979, spring 2009  . IR IMAGING GUIDED PORT INSERTION  10/29/2018  . LAPAROSCOPIC CHOLECYSTECTOMY     & IOC  . multiple percutaneous coronary interventions    . NASAL SINUS SURGERY     x2, sees Dr. [name], still having problems, states he uses Benadryl PRN- up to 5 times per day   . right temporal artery biopsy    . right wrist plate insertion  34/1962  . ROBOTIC ASSITED PARTIAL NEPHRECTOMY Left 03/02/2018   Procedure: XI ROBOTIC ASSITED LEFT PARTIAL NEPHRECTOMY WITH LYSIS OF ADHESIONS X30 MINUTES.;  Surgeon: Ardis Hughs, MD;  Location: [facility];  Service: Urology;  Laterality: Left;  CLAMP TIME FOR KIDNEY 1053-1110 TOTAL 18MINUTES  . Rotator cuff surgery Right   . SKIN BIOPSY Right 10/31/2017   Chest  . TRANSURETHRAL RESECTION OF BLADDER TUMOR N/A 11/03/2017   Procedure: cystoscopy;  Surgeon: Kathie Rhodes, MD;  Location: [facility];  Service: Urology;  Laterality: N/A;  . UMBILICAL HERNIA REPAIR    . UPPER GI ENDOSCOPY    . VENTRAL HERNIA REPAIR N/A 03/02/2018   Procedure: LAPAROSCOPIC REPAIR OF INCARCERATED INCISIONAL HERNIA WITH LYSIS OF ADHESIONS;  Surgeon: Michael Boston, MD;  Location: [facility];  Service:  General;  Laterality: N/A;    REVIEW OF SYSTEMS:  A comprehensive review of systems was negative except for: Constitutional: positive for fatigue Respiratory: positive for dyspnea on exertion   PHYSICAL EXAMINATION: General  appearance: alert, cooperative, fatigued and no distress Head: Normocephalic, without obvious abnormality, atraumatic Neck: no adenopathy, no JVD, supple, symmetrical, trachea midline and thyroid not enlarged, symmetric, no tenderness/mass/nodules Lymph nodes: Cervical, supraclavicular, and axillary nodes normal. Resp: clear to auscultation bilaterally Back: symmetric, no curvature. ROM normal. No CVA tenderness. Cardio: regular rate and rhythm, S1, S2 normal, no murmur, click, rub or gallop GI: soft, non-tender; bowel sounds normal; no masses,  no organomegaly Extremities: extremities normal, atraumatic, no cyanosis or edema  ECOG PERFORMANCE STATUS: 1 - Symptomatic but completely ambulatory  Blood pressure 126/75, pulse 85, temperature 98.2 F (36.8 C), temperature source Oral, resp. rate 18, height 5' 7"  (1.702 m), weight 210 lb (95.3 kg), SpO2 97 %.  LABORATORY DATA: Lab Results  Component Value Date   WBC 3.9 (L) 08/08/2019   HGB 13.2 08/08/2019   HCT 39.0 08/08/2019   MCV 95.1 08/08/2019   PLT 149 (L) 08/08/2019      Chemistry      Component Value Date/Time   NA 137 08/08/2019 0922   K 4.0 08/08/2019 0922   CL 103 08/08/2019 0922   CO2 24 08/08/2019 0922   BUN 26 (H) 08/08/2019 0922   CREATININE 1.10 08/08/2019 0922   CREATININE 1.10 11/28/2018 0959      Component Value Date/Time   CALCIUM 8.9 08/08/2019 0922   ALKPHOS 108 08/08/2019 0922   AST 31 08/08/2019 0922   ALT 28 08/08/2019 0922   BILITOT 0.4 08/08/2019 0922       RADIOGRAPHIC STUDIES: No results found.  ASSESSMENT AND PLAN: This is a very pleasant 74 years old white male recently diagnosed with metastatic non-small cell lung cancer, adenocarcinoma presented with left upper lobe lung nodule in addition to metastatic bone disease diagnosed in April 2020. The molecular studies showed no actionable mutations and PDL 1 expression was negative. The patient also has a history of renal cell carcinoma status  post wedge resection of the left kidney in September 2019. The patient is currently undergoing systemic chemotherapy with carboplatin for AUC of 5, Alimta 500 mg/M2 and Keytruda 200 mg IV every 3 weeks status post 14 cycles.  Starting from cycle #5 the patient will be treated with maintenance Alimta and Keytruda every 3 weeks. The patient has been tolerating this treatment well except for fatigue.  He does not exercise at regular basis and I strongly encouraged him to start exercising daily. I recommended for him to proceed with cycle #15 today as planned. I will see him back for follow-up visit in 3 weeks for evaluation with repeat CT scan of the chest, abdomen pelvis for restaging of his disease. He was advised to call immediately if he has any concerning symptoms in the interval. The patient voices understanding of current disease status and treatment options and is in agreement with the current care plan. All questions were answered. The patient knows to call the clinic with any problems, questions or concerns. We can certainly see the patient much sooner if necessary.  Disclaimer: This note was dictated with voice recognition software. Similar sounding words can inadvertently be transcribed and may not be corrected upon review.

## 2019-08-09 ENCOUNTER — Telehealth: Payer: Self-pay | Admitting: Internal Medicine

## 2019-08-09 NOTE — Telephone Encounter (Signed)
Scheduled per los. Called and left msg. Mailed printout  °

## 2019-08-12 DIAGNOSIS — M19211 Secondary osteoarthritis, right shoulder: Secondary | ICD-10-CM | POA: Diagnosis not present

## 2019-08-13 ENCOUNTER — Other Ambulatory Visit: Payer: Self-pay | Admitting: Internal Medicine

## 2019-08-16 ENCOUNTER — Telehealth: Payer: Self-pay | Admitting: Medical Oncology

## 2019-08-16 ENCOUNTER — Other Ambulatory Visit: Payer: Self-pay

## 2019-08-16 ENCOUNTER — Other Ambulatory Visit: Payer: Self-pay | Admitting: Medical

## 2019-08-16 ENCOUNTER — Inpatient Hospital Stay: Payer: Medicare Other | Attending: Internal Medicine | Admitting: Medical

## 2019-08-16 VITALS — BP 143/82 | HR 85 | Temp 98.9°F | Resp 18 | Ht 67.0 in | Wt 214.5 lb

## 2019-08-16 DIAGNOSIS — Z79899 Other long term (current) drug therapy: Secondary | ICD-10-CM | POA: Diagnosis not present

## 2019-08-16 DIAGNOSIS — C3412 Malignant neoplasm of upper lobe, left bronchus or lung: Secondary | ICD-10-CM | POA: Diagnosis not present

## 2019-08-16 DIAGNOSIS — Z5111 Encounter for antineoplastic chemotherapy: Secondary | ICD-10-CM | POA: Diagnosis not present

## 2019-08-16 DIAGNOSIS — R600 Localized edema: Secondary | ICD-10-CM

## 2019-08-16 DIAGNOSIS — Z5112 Encounter for antineoplastic immunotherapy: Secondary | ICD-10-CM | POA: Insufficient documentation

## 2019-08-16 DIAGNOSIS — C3492 Malignant neoplasm of unspecified part of left bronchus or lung: Secondary | ICD-10-CM | POA: Diagnosis not present

## 2019-08-16 MED ORDER — DOXYCYCLINE HYCLATE 100 MG PO CAPS: 100.0000 mg | ORAL_CAPSULE | Freq: Two times a day (BID) | ORAL | 0 refills | Status: DC

## 2019-08-16 MED ORDER — FUROSEMIDE 20 MG PO TABS: ORAL_TABLET | ORAL | 1 refills | Status: DC

## 2019-08-16 NOTE — Progress Notes (Signed)
[facility]pt[facility] Progress Note   [name] [facility] [id number] 1945/11/19 74 y.o.  [name] is managed by Dr. Fanny Bien. [name]  Actively treated with chemotherapy/immunotherapy/hormonal therapy: yes  Current therapy: Alimta and Keytruda  Last treated: 08/08/2019  Next scheduled appointment with provider: 08/29/2019  Assessment: Plan:    Localized edema - Plan: furosemide (LASIX) 20 MG tablet, BNP (Brain natriuretic peptide)  Adenocarcinoma of left lung, stage 4 (HCC)   Bilateral lower extremity edema likely secondary to hypoalbuminemia and possible third spacing of IV fluids; the patient was given a prescription for Lasix 20 mg once daily for 3 days.  He was instructed to stop Lasix for 2 to 3 days and restarted as needed.  He is also instructed to increase his intake of potassium rich foods.  His labs will be rechecked on his return.  A BNP has been added to his upcoming labs.  Stage IV adenocarcinoma of the lung: The patient continues to be managed by Dr. Julien Nordmann and is status post cycle 15, day 1 of Alimta and Keytruda which was dosed on 08/08/2019.  He is scheduled to return on 08/29/2019 for follow-up.  Please see After Visit Summary for patient specific instructions.  Future Appointments  Date Time Provider [place]  08/27/2019  9:30 AM [facility] 2 [facility] [place]  08/29/2019 11:00 AM [facility]/FLUSH [facility] None  08/29/2019 11:15 AM [facility] [place] FLUSH [facility] None  08/29/2019 11:45 AM Curt Bears, MD [facility] None  08/29/2019 12:30 PM [facility] INFUSION [facility] None  09/02/2019 10:30 AM Liane Comber, NP [facility] None  09/19/2019 10:45 AM [facility]/FLUSH [facility] None  09/19/2019 11:00 AM [facility] [place] FLUSH [facility] None  09/19/2019 11:30 AM Heilingoetter, Cassandra L, PA-C [facility] None  09/19/2019 12:30 PM [facility] INFUSION [facility] None  10/10/2019  9:30 AM [facility] LAB/FLUSH [facility] None  10/10/2019   9:45 AM [facility] [place] FLUSH [facility] None  10/10/2019 10:15 AM Curt Bears, MD [facility] None  10/10/2019 11:00 AM [facility] INFUSION [facility] None  11/06/2019  9:00 AM Dorothy Spark, MD [facility]  12/03/2019 11:30 AM [name], MD [facility] None  03/04/2020 11:15 AM Liane Comber, NP [facility] None  06/09/2020  9:00 AM [name], MD [facility] None    Orders Placed This Encounter  Procedures  . BNP (Brain natriuretic peptide)       Subjective:   Patient ID:  [name] is a 74 y.o. (DOB 1945/06/24) male.  Chief Complaint: No chief complaint on file.   HPI [name]  Is a 74 y.o. male with a diagnosis of a stage IV adenocarcinoma of the lung.  He is managed by Dr. Julien Nordmann and is status post cycle 15, day 1 of Alimta and Keytruda which was dosed on 08/08/2019.  He presents to the clinic today with a history of bilateral lower extremity edema with his feet appearing red and warm to the touch for the last 7 days.  He reports that his symptoms developed after his last treatment.  He denies shortness of breath, chest pain, and has no history of congestive heart failure.  Medications: I have reviewed the patient's current medications.  Allergies:  Allergies  Allergen Reactions  . Codeine Itching and Other (See Comments)    Also hallucinations   . Meloxicam Rash  . Morphine And Related Other (See Comments)    Hallucinations  . Nsaids Other (See Comments)    BLEEDING--naprosyn  . Oxycodone     Hallucinations  . Crestor [Rosuvastatin]  Other (See Comments)    Soreness/aching  . Cymbalta [Duloxetine Hcl] Other (See Comments)    Pt is unsure of reaction type  . Dilaudid [Hydromorphone Hcl] Itching and Other (See Comments)    Hallucinations.  . Effexor [Venlafaxine] Other (See Comments)    Unsure of reactions type  . Gluten Meal Other (See Comments)    Runny nose (constant); bloating.  . Topamax [Topiramate] Other  (See Comments)    Caused visual impairments/swelling in eyes--pinch off optic nerve (temporary blindness)  . Tramadol Hcl   . Trazodone And Nefazodone Other (See Comments)    Unsure of reaction type  . Adhesive [Tape] Other (See Comments)    SKIN PEELS--PAPER TAPE IS OKAY  . Penicillins Rash  . Sulfa Antibiotics Swelling, Rash and Other (See Comments)    Mouth sores    Past Medical History:  Diagnosis Date  . Anginal pain ([place])   . Anxiety    treated /w Xanax for mild depression , using 2 times per day, not PRN  . Asthma   . Benign prostatic hypertrophy   . CAD (coronary artery disease)    pt last left heart cath was in Nov 2008. EF was 55% on left ventriculogram. Circumflex was totally occluded after the 1st obtuse marginal with collaterals supplying the distal circumflex. LAD showed luminal irregularities. The stent in LAD was patent. The RCA showed luminal irregularities. The stent in th emid RCA and PDA were patent. There were no inverventions.   . Chest pain    from anxiety last time 1 month ago  . 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 Chronic obstructive pulmonary disease (HCC)    asthma  . Cluster headache    relative to sinus problems none recnt  . Constipation   . Depression   . DM2 (diabetes mellitus, type 2) ([place])    well with diet and exercise controlled  . GERD (gastroesophageal reflux disease)    hiatal hernia. Patient did have a Nissen fundoplication rare  . Heart murmur    heard 30 yrs ago  . History of blood transfusion    need for Bld. transfusion relative to taking NSAIDS  . HTN (hypertension)    pt. followed by [facility], last cardiac visit 2012, one yr. ago  . Hyperlipidemia   . Joint pain   . Lactose intolerance   . Left kidney mass   . Low back pain    chronic  . Metastatic non-small cell lung cancer (HCC)    adenocarcinoma, LUL, bone metastasis in 09/2018  . Neuromuscular disorder ([place])     nerve involvement in back & upper back relative to hardware in neck   . Obesity   . OSA and COPD overlap syndrome ([place]) 02/25/2015   no cpap used pt denies sleep apnea  . Osteoarthritis   . Peptic ulcer disease   . Pneumonia not recent per pt  . 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 Sleep concern    states the study (2003 )was done at [facility]., it failed & he was told to return & he never did. Pt. states he has been found by prev. hosp. staff that he has to be told when to breathe & his wife does the same.   Marland Kitchen Spinal fracture    hx of traumatic in Jun 04, 2007 after falling off the roof  .  Tinnitus, bilateral   . Vitamin D deficiency     Past Surgical History:  Procedure Laterality Date  . BACK SURGERY     x3- last surgery lumbar- 1998- / fusion   . blepheroplasty     both eyesx2  . C5-T6 posterior fusion     with [place] and radius screws  . [place], 2008 & 2012 multiple stents  total 4 times  . cataracts     cataracts removed- /w IOL - both eyes   . CERVICAL SPINE SURGERY    . COLONOSCOPY    . ESOPHAGEAL MANOMETRY    . HARDWARE REMOVAL  02/20/2012   Procedure: HARDWARE REMOVAL;  Surgeon: [name], MD;  Location: [place] [facility];  Service: Neurosurgery;  Laterality: N/A;  Hardware Removal  . HIATAL HERNIA REPAIR  1979, spring 2009  . IR IMAGING GUIDED PORT INSERTION  10/29/2018  . LAPAROSCOPIC CHOLECYSTECTOMY     & IOC  . multiple percutaneous coronary interventions    . NASAL SINUS SURGERY     x2, sees Dr. [name], still having problems, states he uses Benadryl PRN- up to 5 times per day   . right temporal artery biopsy    . right wrist plate insertion  [id number]  . ROBOTIC ASSITED PARTIAL NEPHRECTOMY Left 03/02/2018   Procedure: XI ROBOTIC ASSITED LEFT PARTIAL NEPHRECTOMY WITH LYSIS OF ADHESIONS X30 MINUTES.;  Surgeon: Ardis Hughs, MD;  [facility];  Service: Urology;  Laterality: Left;   CLAMP TIME FOR KIDNEY 1053-1110 TOTAL 18MINUTES  . Rotator cuff surgery Right   . SKIN BIOPSY Right 10/31/2017   Chest  . TRANSURETHRAL RESECTION OF BLADDER TUMOR N/A 11/03/2017   Procedure: cystoscopy;  Surgeon: Kathie Rhodes, MD;  [facility];  Service: Urology;  Laterality: N/A;  . UMBILICAL HERNIA REPAIR    . UPPER GI ENDOSCOPY    . VENTRAL HERNIA REPAIR N/A 03/02/2018   Procedure: LAPAROSCOPIC REPAIR OF INCARCERATED INCISIONAL HERNIA WITH LYSIS OF ADHESIONS;  Surgeon: Michael Boston, MD;  [facility];  Service: General;  Laterality: N/A;    Family History  Problem Relation Age of Onset  . Heart failure Mother        in her 12s  . Hypertension Mother   . Heart attack Mother   . Obesity Mother   . Coronary artery disease Father        developed in his 44s  . Hypertension Father   . Heart attack Father   . Heart attack Brother        in there 76s  . Heart attack Brother        in there 11s  . Gout Sister     Social History   Socioeconomic History  . Marital status: Married    Spouse name: Not on file  . Number of children: Not on file  . Years of education: Not on file  . Highest education level: Not on file  Occupational History  . Occupation: Retired  Tobacco Use  . Smoking status: Former Smoker    Packs/day: 1.00    Years: 20.00    Pack years: 20.00    Types: Cigarettes    Quit date: 02/14/1978    Years since quitting: 41.5  . Smokeless tobacco: Never Used  Substance and Sexual Activity  . Alcohol use: Not Currently    Alcohol/week: 0.0 standard drinks    Comment: rare  . Drug use: No  . Sexual activity:  Not on file  Other Topics Concern  . Not on file  Social History Narrative  . Not on file   Social Determinants of Health   Financial Resource Strain:   . Difficulty of Paying Living Expenses: Not on file  Food Insecurity:   . Worried About Charity fundraiser in the Last Year: Not on file  . Ran Out of Food in the Last Year: Not on file    Transportation Needs:   . Lack of Transportation (Medical): Not on file  . Lack of Transportation (Non-Medical): Not on file  Physical Activity:   . Days of Exercise per Week: Not on file  . Minutes of Exercise per Session: Not on file  Stress:   . Feeling of Stress : Not on file  Social Connections:   . Frequency of Communication with Friends and Family: Not on file  . Frequency of Social Gatherings with Friends and Family: Not on file  . Attends Religious Services: Not on file  . Active Member of Clubs or Organizations: Not on file  . Attends Archivist Meetings: Not on file  . Marital Status: Not on file  Intimate Partner Violence:   . Fear of Current or Ex-Partner: Not on file  . Emotionally Abused: Not on file  . Physically Abused: Not on file  . Sexually Abused: Not on file    Past Medical History, Surgical history, Social history, and Family history were reviewed and updated as appropriate.   Please see review of systems for further details on the patient's review from today.   Review of Systems:  Review of Systems  Constitutional: Negative for chills, diaphoresis and fever.  HENT: Negative for trouble swallowing and voice change.   Respiratory: Negative for cough, chest tightness, shortness of breath and wheezing.   Cardiovascular: Positive for leg swelling. Negative for chest pain and palpitations.  Gastrointestinal: Negative for abdominal pain, constipation, diarrhea, nausea and vomiting.  Genitourinary: Negative for difficulty urinating, frequency and urgency.  Musculoskeletal: Negative for back pain and myalgias.  Neurological: Negative for dizziness, light-headedness and headaches.    Objective:   Physical Exam:  BP (!) 143/82 (BP Location: Left Arm, Patient Position: Sitting)   Pulse 85   Temp 98.9 F (37.2 C) (Temporal)   Resp 18   Ht 5\' 7"  (1.702 m)   Wt 214 lb 8 oz (97.3 kg)   SpO2 98%   BMI 33.60 kg/m  ECOG: 0  Physical  Exam Constitutional:      General: He is not in acute distress.    Appearance: He is not diaphoretic.  HENT:     Head: Normocephalic and atraumatic.  Eyes:     General: No scleral icterus.       Right eye: No discharge.        Left eye: No discharge.     Conjunctiva/sclera: Conjunctivae normal.  Cardiovascular:     Rate and Rhythm: Normal rate and regular rhythm.     Heart sounds: Normal heart sounds. No murmur. No friction rub. No gallop.      Comments: No presacral edema. Pulmonary:     Effort: Pulmonary effort is normal. No respiratory distress.     Breath sounds: Normal breath sounds. No wheezing or rales.  Musculoskeletal:        General: No deformity or signs of injury.     Right lower leg: Edema present.     Left lower leg: Edema present.     Comments: 1+  bilateral lower extremity edema to the knees.  Skin:    General: Skin is warm and dry.     Findings: Erythema present. No lesion or rash.     Comments: Both right and left feet show erythema along the lateral and superior edges of the foot with increased warmth.  There is no evidence of skin breakdown or trauma.  Neurological:     Mental Status: He is alert.     Coordination: Coordination normal.     Gait: Gait normal.  Psychiatric:        Mood and Affect: Mood normal.        Behavior: Behavior normal.        Thought Content: Thought content normal.        Judgment: Judgment normal.     Lab Review:     Component Value Date/Time   NA 137 08/08/2019 0922   K 4.0 08/08/2019 0922   CL 103 08/08/2019 0922   CO2 24 08/08/2019 0922   GLUCOSE 107 (H) 08/08/2019 0922   BUN 26 (H) 08/08/2019 0922   CREATININE 1.10 08/08/2019 0922   CREATININE 1.10 11/28/2018 0959   CALCIUM 8.9 08/08/2019 0922   PROT 6.9 08/08/2019 0922   ALBUMIN 3.1 (L) 08/08/2019 0922   AST 31 08/08/2019 0922   ALT 28 08/08/2019 0922   ALKPHOS 108 08/08/2019 0922   BILITOT 0.4 08/08/2019 0922   GFRNONAA >60 08/08/2019 0922   GFRNONAA 65  11/28/2018 0959   GFRAA >60 08/08/2019 0922   GFRAA 76 11/28/2018 0959       Component Value Date/Time   WBC 3.9 (L) 08/08/2019 0922   WBC 3.4 (L) 11/28/2018 0959   RBC 4.10 (L) 08/08/2019 0922   HGB 13.2 08/08/2019 0922   HCT 39.0 08/08/2019 0922   PLT 149 (L) 08/08/2019 0922   MCV 95.1 08/08/2019 0922   MCH 32.2 08/08/2019 0922   MCHC 33.8 08/08/2019 0922   RDW 14.8 08/08/2019 0922   LYMPHSABS 1.2 08/08/2019 0922   MONOABS 0.4 08/08/2019 0922   EOSABS 0.1 08/08/2019 0922   BASOSABS 0.0 08/08/2019 0922   -------------------------------  Imaging from last 24 hours (if applicable):  Radiology interpretation: No results found.

## 2019-08-16 NOTE — Patient Instructions (Signed)

## 2019-08-16 NOTE — Progress Notes (Signed)
Pt seen by PA Lucianne Lei only, no assessment by [facility] RN at this time.  PA Lucianne Lei aware.

## 2019-08-16 NOTE — Telephone Encounter (Signed)
Bilateral lower leg swelling x 1 week)  now "severe"with  Redness and warmth  to touch. 2/25 -alimta ,Beryle Flock. Per Julien Nordmann pt can see Lucianne Lei today.

## 2019-08-16 NOTE — Telephone Encounter (Signed)
Refill request

## 2019-08-21 ENCOUNTER — Ambulatory Visit (INDEPENDENT_AMBULATORY_CARE_PROVIDER_SITE_OTHER): Payer: Medicare Other | Admitting: Internal Medicine

## 2019-08-21 ENCOUNTER — Other Ambulatory Visit: Payer: Self-pay

## 2019-08-21 VITALS — BP 124/70 | HR 96 | Temp 97.0°F | Resp 16 | Ht 67.0 in | Wt 215.0 lb

## 2019-08-21 DIAGNOSIS — R609 Edema, unspecified: Secondary | ICD-10-CM

## 2019-08-21 DIAGNOSIS — I872 Venous insufficiency (chronic) (peripheral): Secondary | ICD-10-CM | POA: Diagnosis not present

## 2019-08-21 DIAGNOSIS — I2510 Atherosclerotic heart disease of native coronary artery without angina pectoris: Secondary | ICD-10-CM

## 2019-08-21 MED ORDER — FUROSEMIDE 80 MG PO TABS: ORAL_TABLET | ORAL | 1 refills | Status: DC

## 2019-08-21 NOTE — Patient Instructions (Signed)
Edema  Edema is an abnormal buildup of fluids in the body tissues and under the skin. Swelling of the legs, feet, and ankles is a common symptom that becomes more likely as you get older. Swelling is also common in looser tissues, like around the eyes. When the affected area is squeezed, the fluid may move out of that spot and leave a dent for a few moments. This dent is called pitting edema. There are many possible causes of edema. Eating too much salt (sodium) and being on your feet or sitting for a long time can cause edema in your legs, feet, and ankles. Hot weather may make edema worse. Common causes of edema include:  Heart failure.  Liver or kidney disease.  Weak leg blood vessels.  Cancer.  An injury.  Pregnancy.  Medicines.  Being obese.  Low protein levels in the blood. Edema is usually painless. Your skin may look swollen or shiny. Follow these instructions at home:  Keep the affected body part raised (elevated) above the level of your heart when you are sitting or lying down.  Do not sit still or stand for long periods of time.  Do not wear tight clothing. Do not wear garters on your upper legs.  Exercise your legs to get your circulation going. This helps to move the fluid back into your blood vessels, and it may help the swelling go down.  Wear elastic bandages or support stockings to reduce swelling as told by your health care provider.  Eat a low-salt (low-sodium) diet to reduce fluid as told by your health care provider.  Depending on the cause of your swelling, you may need to limit how much fluid you drink (fluid restriction).  Take over-the-counter and prescription medicines only as told by your health care provider. Contact a health care provider if:  Your edema does not get better with treatment.  You have heart, liver, or kidney disease and have symptoms of edema.  You have sudden and unexplained weight gain. Get help right away if:  You develop  shortness of breath or chest pain.  You cannot breathe when you lie down.  You develop pain, redness, or warmth in the swollen areas.  You have heart, liver, or kidney disease and suddenly get edema.  You have a fever and your symptoms suddenly get worse. Summary  Edema is an abnormal buildup of fluids in the body tissues and under the skin.  Eating too much salt (sodium) and being on your feet or sitting for a long time can cause edema in your legs, feet, and ankles.  Keep the affected body part raised (elevated) above the level of your heart when you are sitting or lying down.

## 2019-08-21 NOTE — Progress Notes (Signed)
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 % days previous patient was prescribed low dose Furosemide 20 mg for new onset dependent edema attributed to hypoalbuminemia. Patient presents today reporting no improvement and probably worsening of the edema.   Wt Readings from Last 3 Encounters:  08/21/19 215 lb (97.5 kg)  08/16/19 214 lb 8 oz (97.3 kg)  08/08/19 210 lb (95.3 kg)   Medications  .  ezetimibe  10 MG tablet, Take 1 tablet  daily. .  furosemide  20 MG tablet, Take for 3 days then stop for 2 to 3 days. Repeat if swelling recurs, .  NITROSTAT 0.4 MG SL,  if needed for Chest Pain .  LOVAZA 1 g cap, Take 2 capsules  2 times daily. .  Albuterol HFA inhaler, Inhale 2 puffs  every 4  hours as needed .  SYMBICORT 160-4.5  inhaler, Use twice daily .  DOXYLAMINE , Take 0.5 tablets  at bedtime.  Marland Kitchen  ipratropium  0.03 % nasal spray, Place 1-2 sprays into both nostrils daily as needed .  NASACORT AERO nasal , Place 2 sprays into the nose at bedtime. .  Triamcinolone Acetonide 2 Sprays Both Nares Every Night .  acetaminophen  500 MG , Take 1 tablet (500 mg total) by mouth every 6 (six) hours as needed. .  folic acid  1 MG , Take 1 tablet  daily. Alveda Reasons 20 MG , TAKE 1 TABLET EVERY DAY .  Alpha-Lipoic Acid 200 MG , Take  2  times daily. Marland Kitchen  VITAMIN C 1000 MG, Take  2 times daily. Marland Kitchen  VITAMIN D 5000 units , Take  2  times daily. .  CHOLINE, Take 600 mg daily. Marland Kitchen  CINNAMON , Take  2  times daily. .  diazepam  5 MG , Take 1/2 to 1 tablet 2 to 3 x /day as needed .  EMLA cream, Apply 1 application topically as needed. .  magnesium oxide 400 MG t, Take 1,200 mg  as needed. Marland Kitchen  MELATONIN , Take 30 mg  at bedtime. .  Multiple Vitamins-Minerals  Take  daily. . Taking OTC Iron 1 tablet daily. Marland Kitchen  pCOMPAZINE 10 MG , Take 1 tablet every 6 hours as needed for nausea    .  SUPER B COMPLEX/C , Take  daily. Marland Kitchen  FLOMAX 0.4 MG , Take 1 capsule Daily  .  VISINE 0.05-0.25 % ophth soln, Place 1-2 drops into both eyes 3  times daily as needed  .  KENALOG 0.1 %, Apply 1 application topically 2  times daily. .  Turmeric 500 MG , Takes 3 in the morning and 3 in the evening .  Zinc 50 MG TABS, Take  daily.  Problem list He has Hyperlipidemia; Cluster headache syndrome; Essential hypertension; Coronary atherosclerosis; COPD mixed type ([place]); GERD; Lumbago, Chronic; PUD; BPH/Prostatism; Vitamin D deficiency; Medication management; Rhinitis, nonallergic, chronic; Obesity (BMI 30.0-34.9); Encounter for Medicare annual wellness exam; OSA and COPD overlap syndrome ([place]); Aortic atherosclerosis ([place]); Anxiety; Other abnormal glucose; Left renal mass s/p robotic partial nephrectomy 03/02/2018; Recurrent incisional hernia with incarceration s/p lap repair w mesh 03/02/2018; Chronic kidney disease (CKD), stage III (moderate); Renal cell carcinoma, left ([place]); Adenocarcinoma of left lung, stage 4 ([place]); Goals of care, counseling/discussion; Encounter for antineoplastic chemotherapy; Encounter for antineoplastic immunotherapy;  Port-A-Cath in place; Pulmonary embolism ([place]); and Statin myopathy on their problem list.   Observations/Objective:  BP 124/70   Pulse 96   Temp (!) 97 F (36.1 C)   Resp 16   Ht 5\' 7"  (1.702 m)   Wt 215 lb (97.5 kg)   BMI 33.67 kg/m   HEENT - WNL. Neck - supple.  Chest - Clear equal BS. Cor - Nl HS. RRR w/o sig MGR. PP 1(+). 1-2 + pretibial, ankle & pedal edema edema. MS- FROM w/o deformities.  Gait Nl. Neuro -  Nl w/o focal abnormalities.  Assessment and Plan:  1. Edema of both lower legs due to peripheral venous insufficiency  - furosemide (LASIX) 80 MG tablet; Take 1/2 to 1 tablet  2 x /day as needed  for Fluid Retention / Leg - Ankle  Swelling  Dispense: 180 tablet; Refill: 1  - Encouraged to use "No-Salt" (KCl)  liberally for potassium  supplementation       I discussed the assessment and treatment plan with the patient. The patient was provided an opportunity to ask questions and all were answered. The patient agreed with the plan and demonstrated an understanding of the instructions.       The patient was advised to call back or seek an in-person evaluation if the symptoms worsen or if the condition fails to improve as anticipated.   Kirtland Bouchard, MD

## 2019-08-22 DIAGNOSIS — M25511 Pain in right shoulder: Secondary | ICD-10-CM | POA: Diagnosis not present

## 2019-08-22 MED ORDER — FUROSEMIDE 80 MG PO TABS: ORAL_TABLET | ORAL | 1 refills | Status: DC

## 2019-08-24 ENCOUNTER — Encounter: Payer: Self-pay | Admitting: Internal Medicine

## 2019-08-27 ENCOUNTER — Other Ambulatory Visit: Payer: Self-pay

## 2019-08-27 ENCOUNTER — Ambulatory Visit (HOSPITAL_COMMUNITY)
Admission: RE | Admit: 2019-08-27 | Discharge: 2019-08-27 | Disposition: A | Discharge status: Home / Self Care | Payer: Medicare Other | Source: Ambulatory Visit | Attending: Internal Medicine | Admitting: Internal Medicine

## 2019-08-27 DIAGNOSIS — C3490 Malignant neoplasm of unspecified part of unspecified bronchus or lung: Secondary | ICD-10-CM | POA: Insufficient documentation

## 2019-08-27 MED ORDER — HEPARIN SOD (PORK) LOCK FLUSH 100 UNIT/ML IV SOLN: 500.0000 [IU] | Freq: Once | INTRAVENOUS | Status: DC

## 2019-08-27 MED ORDER — HEPARIN SOD (PORK) LOCK FLUSH 100 UNIT/ML IV SOLN
500.0000 [IU] | Freq: Once | INTRAVENOUS | Status: AC
  Administered 2019-08-27: 09:00:00 500 [IU] via INTRAVENOUS

## 2019-08-27 MED ORDER — SODIUM CHLORIDE (PF) 0.9 % IJ SOLN
INTRAMUSCULAR | Status: AC
  Filled 2019-08-27: qty 50

## 2019-08-27 MED ORDER — HEPARIN SOD (PORK) LOCK FLUSH 100 UNIT/ML IV SOLN
INTRAVENOUS | Status: AC
  Filled 2019-08-27: qty 5

## 2019-08-27 MED ORDER — IOHEXOL 300 MG/ML  SOLN
100.0000 mL | Freq: Once | INTRAMUSCULAR | Status: AC | PRN
  Administered 2019-08-27: 09:00:00 100 mL via INTRAVENOUS

## 2019-08-28 DIAGNOSIS — M75121 Complete rotator cuff tear or rupture of right shoulder, not specified as traumatic: Secondary | ICD-10-CM | POA: Diagnosis not present

## 2019-08-28 DIAGNOSIS — S46011A Strain of muscle(s) and tendon(s) of the rotator cuff of right shoulder, initial encounter: Secondary | ICD-10-CM | POA: Diagnosis not present

## 2019-08-29 ENCOUNTER — Inpatient Hospital Stay (HOSPITAL_BASED_OUTPATIENT_CLINIC_OR_DEPARTMENT_OTHER): Payer: Medicare Other | Admitting: Internal Medicine

## 2019-08-29 ENCOUNTER — Inpatient Hospital Stay: Payer: Medicare Other

## 2019-08-29 ENCOUNTER — Other Ambulatory Visit: Payer: Self-pay

## 2019-08-29 ENCOUNTER — Encounter: Payer: Self-pay | Admitting: Internal Medicine

## 2019-08-29 VITALS — HR 92 | Resp 19

## 2019-08-29 VITALS — BP 153/73 | HR 101 | Temp 98.5°F | Resp 18 | Ht 67.0 in | Wt 210.0 lb

## 2019-08-29 DIAGNOSIS — C3492 Malignant neoplasm of unspecified part of left bronchus or lung: Secondary | ICD-10-CM

## 2019-08-29 DIAGNOSIS — Z5112 Encounter for antineoplastic immunotherapy: Secondary | ICD-10-CM | POA: Diagnosis not present

## 2019-08-29 DIAGNOSIS — C3412 Malignant neoplasm of upper lobe, left bronchus or lung: Secondary | ICD-10-CM | POA: Diagnosis not present

## 2019-08-29 DIAGNOSIS — Z5111 Encounter for antineoplastic chemotherapy: Secondary | ICD-10-CM | POA: Diagnosis not present

## 2019-08-29 DIAGNOSIS — Z79899 Other long term (current) drug therapy: Secondary | ICD-10-CM | POA: Diagnosis not present

## 2019-08-29 DIAGNOSIS — I2510 Atherosclerotic heart disease of native coronary artery without angina pectoris: Secondary | ICD-10-CM | POA: Diagnosis not present

## 2019-08-29 DIAGNOSIS — I10 Essential (primary) hypertension: Secondary | ICD-10-CM | POA: Diagnosis not present

## 2019-08-29 DIAGNOSIS — R600 Localized edema: Secondary | ICD-10-CM | POA: Diagnosis not present

## 2019-08-29 DIAGNOSIS — R5382 Chronic fatigue, unspecified: Secondary | ICD-10-CM

## 2019-08-29 DIAGNOSIS — Z95828 Presence of other vascular implants and grafts: Secondary | ICD-10-CM

## 2019-08-29 LAB — CMP (CANCER CENTER ONLY)
ALT: 26 U/L (ref 0–44)
AST: 31 U/L (ref 15–41)
Albumin: 3.3 g/dL — ABNORMAL LOW (ref 3.5–5.0)
Alkaline Phosphatase: 109 U/L (ref 38–126)
Anion gap: 10 (ref 5–15)
BUN: 30 mg/dL — ABNORMAL HIGH (ref 8–23)
CO2: 27 mmol/L (ref 22–32)
Calcium: 9.1 mg/dL (ref 8.9–10.3)
Chloride: 98 mmol/L (ref 98–111)
Creatinine: 1.21 mg/dL (ref 0.61–1.24)
GFR, Est AFR Am: >60 mL/min (ref >60)
GFR, Estimated: 58 mL/min — ABNORMAL LOW (ref >60)
Glucose, Bld: 98 mg/dL (ref 70–99)
Potassium: 4 mmol/L (ref 3.5–5.1)
Sodium: 135 mmol/L (ref 135–145)
Total Bilirubin: 0.6 mg/dL (ref 0.3–1.2)
Total Protein: 6.9 g/dL (ref 6.5–8.1)

## 2019-08-29 LAB — CBC WITH DIFFERENTIAL (CANCER CENTER ONLY)
Abs Immature Granulocytes: 0.02 10*3/uL (ref 0.00–0.07)
Basophils Absolute: 0 10*3/uL (ref 0.0–0.1)
Basophils Relative: 1 %
Eosinophils Absolute: 0.1 10*3/uL (ref 0.0–0.5)
Eosinophils Relative: 3 %
HCT: 40.8 % (ref 39.0–52.0)
Hemoglobin: 13.7 g/dL (ref 13.0–17.0)
Immature Granulocytes: 0 %
Lymphocytes Relative: 34 %
Lymphs Abs: 1.5 10*3/uL (ref 0.7–4.0)
MCH: 32.9 pg (ref 26.0–34.0)
MCHC: 33.6 g/dL (ref 30.0–36.0)
MCV: 97.8 fL (ref 80.0–100.0)
Monocytes Absolute: 0.6 10*3/uL (ref 0.1–1.0)
Monocytes Relative: 12 %
Neutro Abs: 2.2 10*3/uL (ref 1.7–7.7)
Neutrophils Relative %: 50 %
Platelet Count: 175 10*3/uL (ref 150–400)
RBC: 4.17 MIL/uL — ABNORMAL LOW (ref 4.22–5.81)
RDW: 15 % (ref 11.5–15.5)
WBC Count: 4.5 10*3/uL (ref 4.0–10.5)
nRBC: 0 % (ref 0.0–0.2)

## 2019-08-29 LAB — TSH: TSH: 0.897 u[IU]/mL (ref 0.320–4.118)

## 2019-08-29 MED ORDER — PROCHLORPERAZINE MALEATE 10 MG PO TABS
10.0000 mg | ORAL_TABLET | Freq: Once | ORAL | Status: AC
  Administered 2019-08-29: 10 mg via ORAL

## 2019-08-29 MED ORDER — SODIUM CHLORIDE 0.9 % IV SOLN
200.0000 mg | Freq: Once | INTRAVENOUS | Status: AC
  Administered 2019-08-29: 200 mg via INTRAVENOUS
  Filled 2019-08-29: qty 8

## 2019-08-29 MED ORDER — PROCHLORPERAZINE MALEATE 10 MG PO TABS
ORAL_TABLET | ORAL | Status: AC
  Filled 2019-08-29: qty 1

## 2019-08-29 MED ORDER — SODIUM CHLORIDE 0.9 % IV SOLN
Freq: Once | INTRAVENOUS | Status: AC
  Filled 2019-08-29: qty 250

## 2019-08-29 MED ORDER — SODIUM CHLORIDE 0.9 % IV SOLN
500.0000 mg/m2 | Freq: Once | INTRAVENOUS | Status: AC
  Administered 2019-08-29: 1000 mg via INTRAVENOUS
  Filled 2019-08-29: qty 40

## 2019-08-29 MED ORDER — HEPARIN SOD (PORK) LOCK FLUSH 100 UNIT/ML IV SOLN
500.0000 [IU] | Freq: Once | INTRAVENOUS | Status: DC
  Filled 2019-08-29: qty 5

## 2019-08-29 MED ORDER — SODIUM CHLORIDE 0.9% FLUSH
10.0000 mL | Freq: Once | INTRAVENOUS | Status: AC
  Administered 2019-08-29: 10 mL
  Filled 2019-08-29: qty 10

## 2019-08-29 MED ORDER — HEPARIN SOD (PORK) LOCK FLUSH 100 UNIT/ML IV SOLN
500.0000 [IU] | Freq: Once | INTRAVENOUS | Status: AC | PRN
  Administered 2019-08-29: 500 [IU]
  Filled 2019-08-29: qty 5

## 2019-08-29 MED ORDER — SODIUM CHLORIDE 0.9% FLUSH
10.0000 mL | INTRAVENOUS | Status: DC | PRN
  Administered 2019-08-29: 10 mL
  Filled 2019-08-29: qty 10

## 2019-08-29 NOTE — Patient Instructions (Signed)
COVID-19 Vaccine Information can be found at: [email or web address] For questions related to vaccine distribution or appointments, please email [email or web address] or call [phone].   [name] Discharge Instructions for Patients Receiving Chemotherapy  Today you received the following chemotherapy agents: Pembrolizumab (Keytruda) and Pemetrexed (Alimta)  To help prevent nausea and vomiting after your treatment, we encourage you to take your nausea medication as directed by your provider.   If you develop nausea and vomiting that is not controlled by your nausea medication, call the clinic.   BELOW ARE SYMPTOMS THAT SHOULD BE REPORTED IMMEDIATELY:  *FEVER GREATER THAN 100.5 F  *CHILLS WITH OR WITHOUT FEVER  NAUSEA AND VOMITING THAT IS NOT CONTROLLED WITH YOUR NAUSEA MEDICATION  *UNUSUAL SHORTNESS OF BREATH  *UNUSUAL BRUISING OR BLEEDING  TENDERNESS IN MOUTH AND THROAT WITH OR WITHOUT PRESENCE OF ULCERS  *URINARY PROBLEMS  *BOWEL PROBLEMS  UNUSUAL RASH Items with * indicate a potential emergency and should be followed up as soon as possible.  Feel free to call the clinic should you have any questions or concerns. The clinic phone number is [phone].  Please show the [name] at check-in to the Emergency Department and triage nurse.  Coronavirus (COVID-19) Are you at risk?  Are you at risk for the Coronavirus (COVID-19)?  To be considered HIGH RISK for Coronavirus (COVID-19), you have to meet the following criteria:  . Traveled to [place], [place], [place], [place] or [place]; or in the [place] to [place], [place], [place], or [place]; and have fever, cough, and shortness of breath within the last 2 weeks of travel OR . Been in close contact with a person diagnosed with COVID-19 within the last 2 weeks and have fever, cough, and shortness of breath . IF YOU DO NOT MEET  THESE CRITERIA, YOU ARE CONSIDERED LOW RISK FOR COVID-19.  What to do if you are HIGH RISK for COVID-19?  Marland Kitchen If you are having a medical emergency, call 911. . Seek medical care right away. Before you go to a doctor's office, urgent care or emergency department, call ahead and tell them about your recent travel, contact with someone diagnosed with COVID-19, and your symptoms. You should receive instructions from your physician's office regarding next steps of care.  . When you arrive at healthcare provider, tell the healthcare staff immediately you have returned from visiting [place], [place], [place], [place] or [place]; or traveled in the [place] to [place], [place], [place], or [place]; in the last two weeks or you have been in close contact with a person diagnosed with COVID-19 in the last 2 weeks.   . Tell the health care staff about your symptoms: fever, cough and shortness of breath. . After you have been seen by a medical provider, you will be either: o Tested for (COVID-19) and discharged home on quarantine except to seek medical care if symptoms worsen, and asked to  - Stay home and avoid contact with others until you get your results (4-5 days)  - Avoid travel on public transportation if possible (such as bus, train, or airplane) or o Sent to the Emergency Department by EMS for evaluation, COVID-19 testing, and possible admission depending on your condition and test results.  What to do if you are LOW RISK for COVID-19?  Reduce your risk of any infection by using the same precautions used for avoiding the common cold or flu:  Marland Kitchen Wash your hands often with soap and warm  water for at least 20 seconds.  If soap and water are not readily available, use an alcohol-based hand sanitizer with at least 60% alcohol.  . If coughing or sneezing, cover your mouth and nose by coughing or sneezing into the elbow areas of your shirt or coat, into a tissue or into your sleeve (not your  hands). . Avoid shaking hands with others and consider head nods or verbal greetings only. . Avoid touching your eyes, nose, or mouth with unwashed hands.  . Avoid close contact with people who are sick. . Avoid places or events with large numbers of people in one location, like concerts or sporting events. . Carefully consider travel plans you have or are making. . If you are planning any travel outside or inside the [name], visit the CDC's Travelers' Health webpage for the latest health notices. . If you have some symptoms but not all symptoms, continue to monitor at home and seek medical attention if your symptoms worsen. . If you are having a medical emergency, call 911.   [name] / e-Visit: [email or web address]         [facility]: [facility]: [phone]                   [facility]: [phone]

## 2019-08-29 NOTE — Progress Notes (Signed)
[name] [phone]   [phone]  OFFICE PROGRESS NOTE  [name], MD [address] [place] [address]  DIAGNOSIS:  1) Stage IV (T1c, N0, M1 C) non-small cell lung cancer, adenocarcinoma presented with left upper lobe lung nodule in addition to metastatic disease to the bone diagnosed in April 2020. 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   3) incidental finding of pulmonary embolism involving the right middle lobe pulmonary artery on CT scan on 12/18/2018.  Molecular studies by [facility] 546  STK11Splice Site SNV 2.7% Everolimus,Temsirolimus  [id number] 0.8% None (VUS), No (VUS)  NTRK3 L629L 1.5%  (Synonymous) No (Synonymous)  KRAS G12D 1.1% Binimetinib  CDKN2A H83Y 1.6% Abemaciclib, Palbociclib, Ribociclib  PDL1 TPS was 0%  PRIOR THERAPY: None  CURRENT THERAPY:  1) Systemic chemotherapy with carboplatin for AUC of 5, Alimta 500 mg/M2 and Keytruda 200 mg IV every 3 weeks.  First dose Oct 16, 2018.  Status post 15 cycles.  Starting from cycle #5 the patient will be on maintenance treatment with Alimta and Keytruda. 2) Xarelto initially 15 mg p.o. twice daily for 3 weeks followed by 20 mg p.o. daily.  He started the first dose of this treatment on 12/20/2018.  INTERVAL HISTORY: Darrell KollmannSouthern 74y.o. male returns to the clinic today for follow-up visit.  The patient continues to complain of increasing fatigue and weakness as well as the swelling of the lower extremities.  He was seen at the symptom management clinic last week and started on treatment with Lasix with improvement of the swelling of his lower extremities.  He lost around 5 pounds likely from diuresis.  He denied having any current chest pain, shortness of breath, cough or hemoptysis.  He denied having any fever or chills.  He has no nausea, vomiting, diarrhea or  constipation.  He has no headache or visual changes.  He had repeat CT scan of the chest, abdomen pelvis performed recently and he is here for evaluation and discussion of his discuss results.  MEDICAL HISTORY: Past Medical History:  Diagnosis Date   Anginal pain ([place]    Anxiety    treated /w Xanax for mild depression , using 2 times per day, not PRN   Asthma    Benign prostatic hypertrophy    CAD (coronary artery disease)    pt last left heart cath was in Nov 2008. EF was 55% on left ventriculogram. Circumflex was totally occluded after the 1st obtuse marginal with collaterals supplying the distal circumflex. LAD showed luminal irregularities. The stent in LAD was patent. The RCA showed luminal irregularities. The stent in th emid RCA and PDA were patent. There were no inverventions.    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 Patient did have a Nissen fundoplication rare   Heart murmur    heard 30 yrs ago   History of blood transfusion    need for  Bld. transfusion relative to taking NSAIDS   HTN (hypertension)    pt. followed by [name], last cardiac visit 2012, one yr. ago   Hyperlipidemia    Joint pain    Lactose intolerance    Left kidney mass    Low back pain    chronic   Metastatic non-small cell lung cancer ([place])    adenocarcinoma, LUL, bone metastasis in 09/2018   Neuromuscular disorder ([place])    nerve involvement in back & upper back relative to hardware in neck    Obesity    OSA and COPD overlap syndrome ([place]) 02/25/2015   no cpap used pt denies sleep apnea     Osteoarthritis    Peptic ulcer disease    Pneumonia not recent per pt   Skin cancer    basal cell- face & head, 1 melanoma revoved from left side of face   Sleep concern    states the study (2003 )was done at [facility]., it failed & he was told to return & he never did. Pt. states he has been found by prev. hosp. staff that he has to be told when to breathe & his wife does the same.    Spinal fracture    hx of traumatic in Jun 04, 2007 after falling off the roof   Tinnitus, bilateral    Vitamin D deficiency     ALLERGIES:  is allergic to codeine; meloxicam; morphine and related; nsaids; oxycodone; crestor [rosuvastatin]; cymbalta [duloxetine hcl]; dilaudid [hydromorphone hcl]; effexor [venlafaxine]; gluten meal; topamax [topiramate]; tramadol hcl; trazodone and nefazodone; adhesive [tape]; penicillins; and sulfa antibiotics.  MEDICATIONS:  Current Outpatient Medications  Medication Sig Dispense Refill   acetaminophen (TYLENOL) 500 MG tablet Take 1 tablet (500 mg total) by mouth every 6 (six) hours as needed. 30 tablet 0   albuterol (VENTOLIN HFA) 108 (90 Base) MCG/ACT inhaler Inhale 2 puffs into the lungs every 4 (four) hours as needed for wheezing or shortness of breath. 18 g 1   Alpha-Lipoic Acid 200 MG CAPS Take by mouth 2 (two) times daily.     Ascorbic Acid (VITAMIN C) 1000 MG tablet Take 1,000 mg by mouth 2 (two) times daily.     Blood Glucose Monitoring Suppl (FREESTYLE FREEDOM LITE) w/Device KIT Check blood sugar 1 time a day. [id number] 1 each 0   budesonide-formoterol (SYMBICORT) 160-4.5 MCG/ACT inhaler Use twice daily, wash your mouth afterwards to avoid yeast. 1 Inhaler 12   Cholecalciferol (VITAMIN D3) 5000 units CAPS Take 5,000 Units by mouth 2 (two) times daily.     CHOLINE PO Take 600 mg by mouth daily.     CINNAMON PO Take by mouth 2 (two) times daily.     diazepam (VALIUM) 5 MG tablet Take 1/2 to 1 tablet 2 to 3 x /day as needed for Anxiety 270  tablet 0   doxycycline (VIBRAMYCIN) 100 MG capsule Take 1 capsule (100 mg total) by mouth 2 (two) times daily. 14 capsule 0   DOXYLAMINE SUCCINATE PO Take 0.5 tablets by mouth at bedtime.      ezetimibe (ZETIA) 10 MG tablet Take 1 tablet (10 mg total) by mouth daily. 90 tablet 3   folic acid (FOLVITE) 1 MG tablet Take 1 tablet (1 mg total) by mouth daily. 30 tablet 4   furosemide (LASIX) 80 MG tablet Take 1/2 to 1 tablet 1 to 2 x  /day as Needed for Fluid Retention /  Leg/Ankle Swelling 180 tablet 1   ipratropium (ATROVENT)  0.03 % nasal spray Place 1-2 sprays into both nostrils daily as needed (for allergies.). 30 mL 3   lidocaine-prilocaine (EMLA) cream Apply 1 application topically as needed. 30 g 0   magnesium oxide (MAG-OX) 400 MG tablet Take 1,200 mg by mouth as needed.     MELATONIN PO Take 30 mg by mouth at bedtime.     meloxicam (MOBIC) 15 MG tablet Take 15 mg by mouth daily.     Multiple Vitamins-Minerals (MULTIVITAMIN ADULT PO) Take by mouth daily.     nitroGLYCERIN (NITROSTAT) 0.4 MG SL tablet Dissolve 1 tablet under tongue every 3 to 5 minutes if needed for Chest Pain 50 tablet 3   omega-3 acid ethyl esters (LOVAZA) 1 g capsule Take 2 capsules (2 g total) by mouth 2 (two) times daily. 360 capsule 3   OVER THE COUNTER MEDICATION Taking OTC Iron 1 tablet daily.     prochlorperazine (COMPAZINE) 10 MG tablet Take 1 tablet (10 mg total) by mouth every 6 (six) hours as needed for nausea or vomiting. 30 tablet 0   SUPER B COMPLEX/C PO Take by mouth daily.     tamsulosin (FLOMAX) 0.4 MG CAPS capsule Take 1 capsule Daily for for Prostate 90 capsule 3   tetrahydrozoline-zinc (VISINE-AC) 0.05-0.25 % ophthalmic solution Place 1-2 drops into both eyes 3 (three) times daily as needed (for redness/irritation.).     triamcinolone (NASACORT) 55 MCG/ACT AERO nasal inhaler Place 2 sprays into the nose at bedtime. 1 Inhaler 3   Triamcinolone Acetonide (NASAL ALLERGY 24 HOUR NA)  SMARTSIG:2 Spray(s) Both Nares Every Night     triamcinolone ointment (KENALOG) 0.1 % Apply 1 application topically 2 (two) times daily. 80 g 1   Turmeric 500 MG TABS Take by mouth. Takes 3 in the morning and 3 in the evening     XARELTO 20 MG TABS tablet TAKE 1 TABLET BY MOUTH EVERY DAY WITH SUPPER 30 tablet 2   Zinc 50 MG TABS Take by mouth daily.     No current facility-administered medications for this visit.    SURGICAL HISTORY:  Past Surgical History:  Procedure Laterality Date   BACK SURGERY     x3- last surgery lumbar- 1998- / fusion    blepheroplasty     both eyesx2   C5-T6 posterior fusion     with [place] and radius screws   [place], 2008 & 2012 multiple stents  total 4 times   cataracts     cataracts removed- /w IOL - both eyes    CERVICAL SPINE SURGERY     COLONOSCOPY     ESOPHAGEAL MANOMETRY     HARDWARE REMOVAL  02/20/2012   Procedure: HARDWARE REMOVAL;  Surgeon: [name], MD;  Location: [facility];  Service: Neurosurgery;  Laterality: N/A;  Hardware Removal   HIATAL HERNIA REPAIR  1979, spring 2009   IR IMAGING GUIDED PORT INSERTION  10/29/2018   LAPAROSCOPIC CHOLECYSTECTOMY     & IOC   multiple percutaneous coronary interventions     NASAL SINUS SURGERY     x2, sees Dr. [name], still having problems, states he uses Benadryl PRN- up to 5 times per day    right temporal artery biopsy     right wrist plate insertion  60/1561   ROBOTIC ASSITED PARTIAL NEPHRECTOMY Left 03/02/2018   Procedure: XI ROBOTIC ASSITED LEFT PARTIAL [place].;  Surgeon: Ardis Hughs, MD;  Location: [facility];  Service: Urology;  Laterality: Left;  CLAMP TIME FOR KIDNEY 1053-1110 TOTAL 18MINUTES   Rotator cuff surgery Right    SKIN BIOPSY Right 10/31/2017   Chest   TRANSURETHRAL RESECTION OF BLADDER TUMOR N/A 11/03/2017   Procedure: cystoscopy;  Surgeon: Kathie Rhodes, MD;  Location: [facility];  Service:  Urology;  Laterality: N/A;   UMBILICAL HERNIA REPAIR     UPPER GI ENDOSCOPY     VENTRAL HERNIA REPAIR N/A 03/02/2018   Procedure: LAPAROSCOPIC REPAIR OF INCARCERATED INCISIONAL HERNIA WITH LYSIS OF ADHESIONS;  Surgeon: Michael Boston, MD;  Location: [facility];  Service: General;  Laterality: N/A;    REVIEW OF SYSTEMS:  Constitutional: positive for fatigue Eyes: negative Ears, nose, mouth, throat, and face: negative Respiratory: negative Cardiovascular: negative Gastrointestinal: negative Genitourinary:negative Integument/breast: negative Hematologic/lymphatic: negative Musculoskeletal:negative Neurological: negative Behavioral/Psych: negative Endocrine: negative Allergic/Immunologic: negative   PHYSICAL EXAMINATION: General appearance: alert, cooperative, fatigued and no distress Head: Normocephalic, without obvious abnormality, atraumatic Neck: no adenopathy, no JVD, supple, symmetrical, trachea midline and thyroid not enlarged, symmetric, no tenderness/mass/nodules Lymph nodes: Cervical, supraclavicular, and axillary nodes normal. Resp: clear to auscultation bilaterally Back: symmetric, no curvature. ROM normal. No CVA tenderness. Cardio: regular rate and rhythm, S1, S2 normal, no murmur, click, rub or gallop GI: soft, non-tender; bowel sounds normal; no masses,  no organomegaly Extremities: edema 1+ edema bilaterally Neurologic: Alert and oriented X 3, normal strength and tone. Normal symmetric reflexes. Normal coordination and gait  ECOG PERFORMANCE STATUS: 1 - Symptomatic but completely ambulatory  Blood pressure (!) 153/73, pulse (!) 101, temperature 98.5 F (36.9 C), temperature source Temporal, resp. rate 18, height '5\' 7"'$  (1.702 m), weight 210 lb (95.3 kg), SpO2 97 %.  LABORATORY DATA: Lab Results  Component Value Date   WBC 4.5 08/29/2019   HGB 13.7 08/29/2019   HCT 40.8 08/29/2019   MCV 97.8 08/29/2019   PLT 175 08/29/2019      Chemistry      Component Value  Date/Time   NA 135 08/29/2019 1124   K 4.0 08/29/2019 1124   CL 98 08/29/2019 1124   CO2 27 08/29/2019 1124   BUN 30 (H) 08/29/2019 1124   CREATININE 1.21 08/29/2019 1124   CREATININE 1.10 11/28/2018 0959      Component Value Date/Time   CALCIUM 9.1 08/29/2019 1124   ALKPHOS 109 08/29/2019 1124   AST 31 08/29/2019 1124   ALT 26 08/29/2019 1124   BILITOT 0.6 08/29/2019 1124       RADIOGRAPHIC STUDIES: CT Chest W Contrast  Result Date: 08/27/2019 CLINICAL DATA:  Non-small-cell lung cancer. Restaging. EXAM: CT CHEST, ABDOMEN, AND PELVIS WITH CONTRAST TECHNIQUE: Multidetector CT imaging of the chest, abdomen and pelvis was performed following the standard protocol during bolus administration of intravenous contrast. CONTRAST:  171m OMNIPAQUE IOHEXOL 300 MG/ML  SOLN COMPARISON:  06/25/2019 FINDINGS: CT CHEST FINDINGS Cardiovascular: The heart size is normal. No substantial pericardial effusion. Coronary artery calcification is evident. Atherosclerotic calcification is noted in the wall of the thoracic aorta. Right Port-A-Cath tip is positioned at the SVC/RA junction. Mediastinum/Nodes: No mediastinal lymphadenopathy. There is no hilar lymphadenopathy. The esophagus has normal imaging features. There is no axillary lymphadenopathy. Lungs/Pleura: Spiculated left upper lobe pulmonary nodule has progressed in the interval, measuring 1.5 x 1.3 cm today (image 36/series 6) compared to 1.2 x 1.0 cm when I remeasure in a similar fashion on the prior study. Centrilobular emphsyema noted. No new suspicious pulmonary nodule or mass. No focal airspace consolidation.  Chronic atelectasis or linear scarring noted posterior lower lungs bilaterally. Musculoskeletal: No worrisome lytic or sclerotic osseous abnormality. Extensive cervicothoracic spinal fusion hardware evident. CT ABDOMEN PELVIS FINDINGS Hepatobiliary: No suspicious focal abnormality within the liver parenchyma. Gallbladder is surgically absent. No  intrahepatic or extrahepatic biliary dilation. Pancreas: No focal mass lesion. No dilatation of the main duct. No intraparenchymal cyst. No peripancreatic edema. Spleen: No splenomegaly. No focal mass lesion. Adrenals/Urinary Tract: No adrenal nodule or mass. Right kidney unremarkable. 3 mm nonobstructing stone noted lower pole left kidney with stable exophytic 2.1 cm low-density lesion noted at the lower pole. No evidence for hydroureter. The urinary bladder appears normal for the degree of distention. Stomach/Bowel: Surgical changes noted around the esophagogastric junction suggest prior fundoplication. Duodenum is normally positioned as is the ligament of Treitz. No small bowel wall thickening. No small bowel dilatation. The terminal ileum is normal. The appendix is not visualized, but there is no edema or inflammation in the region of the cecum. No gross colonic mass. No colonic wall thickening. Diverticular changes are noted in the left colon without evidence of diverticulitis. Vascular/Lymphatic: There is abdominal aortic atherosclerosis without aneurysm. There is no gastrohepatic or hepatoduodenal ligament lymphadenopathy. No retroperitoneal or mesenteric lymphadenopathy. No pelvic sidewall lymphadenopathy. Reproductive: The prostate gland and seminal vesicles are unremarkable. Other: No intraperitoneal free fluid. Musculoskeletal: Sclerotic lesions in the posterior iliac bones bilaterally are stable as are the posterior iliac lytic lesions. Left iliac lesion measured previously at 3.8 cm is stable, measuring 3.7 cm today. Lumbar fusion hardware again noted. IMPRESSION: 1. Mild interval progression of the spiculated left upper lobe pulmonary nodule. 2. Otherwise, no new or progressive findings in the chest, abdomen, or pelvis. 3. Stable appearance of sclerotic and lucent bilateral iliac bone lesions. 4. Nonobstructing left renal stone with stable exophytic low-density lesion lower pole left kidney, likely a  cyst. 5. Aortic Atherosclerosis (ICD10-I70.0) and Emphysema (ICD10-J43.9). Electronically Signed   By: Misty Stanley M.D.   On: 08/27/2019 12:37   CT Abdomen Pelvis W Contrast  Result Date: 08/27/2019 CLINICAL DATA:  Non-small-cell lung cancer. Restaging. EXAM: CT CHEST, ABDOMEN, AND PELVIS WITH CONTRAST TECHNIQUE: Multidetector CT imaging of the chest, abdomen and pelvis was performed following the standard protocol during bolus administration of intravenous contrast. CONTRAST:  162m OMNIPAQUE IOHEXOL 300 MG/ML  SOLN COMPARISON:  06/25/2019 FINDINGS: CT CHEST FINDINGS Cardiovascular: The heart size is normal. No substantial pericardial effusion. Coronary artery calcification is evident. Atherosclerotic calcification is noted in the wall of the thoracic aorta. Right Port-A-Cath tip is positioned at the SVC/RA junction. Mediastinum/Nodes: No mediastinal lymphadenopathy. There is no hilar lymphadenopathy. The esophagus has normal imaging features. There is no axillary lymphadenopathy. Lungs/Pleura: Spiculated left upper lobe pulmonary nodule has progressed in the interval, measuring 1.5 x 1.3 cm today (image 36/series 6) compared to 1.2 x 1.0 cm when I remeasure in a similar fashion on the prior study. Centrilobular emphsyema noted. No new suspicious pulmonary nodule or mass. No focal airspace consolidation. Chronic atelectasis or linear scarring noted posterior lower lungs bilaterally. Musculoskeletal: No worrisome lytic or sclerotic osseous abnormality. Extensive cervicothoracic spinal fusion hardware evident. CT ABDOMEN PELVIS FINDINGS Hepatobiliary: No suspicious focal abnormality within the liver parenchyma. Gallbladder is surgically absent. No intrahepatic or extrahepatic biliary dilation. Pancreas: No focal mass lesion. No dilatation of the main duct. No intraparenchymal cyst. No peripancreatic edema. Spleen: No splenomegaly. No focal mass lesion. Adrenals/Urinary Tract: No adrenal nodule or mass. Right  kidney unremarkable. 3 mm nonobstructing stone noted  lower pole left kidney with stable exophytic 2.1 cm low-density lesion noted at the lower pole. No evidence for hydroureter. The urinary bladder appears normal for the degree of distention. Stomach/Bowel: Surgical changes noted around the esophagogastric junction suggest prior fundoplication. Duodenum is normally positioned as is the ligament of Treitz. No small bowel wall thickening. No small bowel dilatation. The terminal ileum is normal. The appendix is not visualized, but there is no edema or inflammation in the region of the cecum. No gross colonic mass. No colonic wall thickening. Diverticular changes are noted in the left colon without evidence of diverticulitis. Vascular/Lymphatic: There is abdominal aortic atherosclerosis without aneurysm. There is no gastrohepatic or hepatoduodenal ligament lymphadenopathy. No retroperitoneal or mesenteric lymphadenopathy. No pelvic sidewall lymphadenopathy. Reproductive: The prostate gland and seminal vesicles are unremarkable. Other: No intraperitoneal free fluid. Musculoskeletal: Sclerotic lesions in the posterior iliac bones bilaterally are stable as are the posterior iliac lytic lesions. Left iliac lesion measured previously at 3.8 cm is stable, measuring 3.7 cm today. Lumbar fusion hardware again noted. IMPRESSION: 1. Mild interval progression of the spiculated left upper lobe pulmonary nodule. 2. Otherwise, no new or progressive findings in the chest, abdomen, or pelvis. 3. Stable appearance of sclerotic and lucent bilateral iliac bone lesions. 4. Nonobstructing left renal stone with stable exophytic low-density lesion lower pole left kidney, likely a cyst. 5. Aortic Atherosclerosis (ICD10-I70.0) and Emphysema (ICD10-J43.9). Electronically Signed   By: Misty Stanley M.D.   On: 08/27/2019 12:37    ASSESSMENT AND PLAN: This is a very pleasant 74 years old white male recently diagnosed with metastatic non-small  cell lung cancer, adenocarcinoma presented with left upper lobe lung nodule in addition to metastatic bone disease diagnosed in April 2020. The molecular studies showed no actionable mutations and PDL 1 expression was negative. The patient also has a history of renal cell carcinoma status post wedge resection of the left kidney in September 2019. The patient is currently undergoing systemic chemotherapy with carboplatin for AUC of 5, Alimta 500 mg/M2 and Keytruda 200 mg IV every 3 weeks status post 15 cycles.  Starting from cycle #5 the patient will be treated with maintenance Alimta and Keytruda every 3 weeks. The patient has been tolerating this treatment well except for the fatigue. He had repeat CT scan of the chest, abdomen pelvis performed recently.  I personally and independently reviewed the scans and discussed the results with the patient today. He has a scan showed no concerning findings for disease progression except for mild increase in a spiculated left upper lobe pulmonary nodule. I had a lengthy discussion with the patient today about his current condition and treatment options.  The patient was given the option of continuing his current treatment with maintenance Alimta and Keytruda and close monitoring of the left upper lobe pulmonary nodule with consideration of SBRT in the future if it continues to increase in size versus observation and close monitoring because of his persistent fatigue and weakness and concern about continuing treatment versus palliative care and hospice referral. The patient decided to proceed with treatment today and he will discuss with his family to make a decision before the next treatment.  Another option would be discontinuation of his chemotherapy with Alimta and keeping him on a single agent Keytruda but the patient has negative PD-L1 expression in the past. For hypertension he was advised to take his blood pressure medications as prescribed and to monitor it  closely at home. For the lower extremity edema, he will  continue on Lasix on as-needed basis. He was advised to call immediately if he has any concerning symptoms in the interval. The patient voices understanding of current disease status and treatment options and is in agreement with the current care plan. All questions were answered. The patient knows to call the clinic with any problems, questions or concerns. We can certainly see the patient much sooner if necessary.  Disclaimer: This note was dictated with voice recognition software. Similar sounding words can inadvertently be transcribed and may not be corrected upon review.

## 2019-08-30 NOTE — Progress Notes (Signed)
3 MONTH FOLLOW UP Assessment:    Atherosclerosis of aorta Per CT 2021 Control blood pressure, cholesterol, glucose, increase exercise.   Essential hypertension - start checking BP at home, cut in ziac in 1/2 if persistently <110/70 with fatigue - continue medications, DASH diet, exercise and monitor at home. Call if greater than 140/90.   Atherosclerosis of coronary artery bypass graft of native heart without angina pectoris Control blood pressure, cholesterol, glucose, increase exercise.   COPD mixed type ([place]) Continue inhalers, avoid smoker/irritants, exercise encouraged  OSA and COPD overlap syndrome ([place]) ? Resolved, weight loss advised, continue to monitor Established with Dr. Brett Fairy  Gastroesophageal reflux disease, esophagitis presence not specified Continue PPI/H2 blocker, diet discussed  Other abnormal glucose Recent A1Cs at goal Discussed diet/exercise, weight management  Defer A1C; monitor weight, getting frequent CMPs via oncology, defer today; monitor for serum glucose  Hyperlipidemia - intolerance of numerous agents; currently zetia and lovaza; managed by lipid clinic - check lipids, decrease fatty foods, increase activity.  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 Alen Blew  Pulmonary Embolism In setting of lung CA, on xarelto 20 mg daily Resolved on CT 02/2019, ? DVT CT 06/2019,  [place] continue indefinitely due to cancer as risk factor, otherwise unprovoked, continue xarelto  indefinitely  Anxiety/insomnia Failed multiple agents for insomnia; managing fairly with low dose valium and melatonin good sleep hygiene discussed, increase day time activity   Over 30 minutes of exam, counseling, chart review, and critical decision making was performed  Future Appointments  Date Time Provider [place]  09/19/2019 10:45 AM [facility] None  09/19/2019 11:00 AM [facility] [place] FLUSH [facility] None  09/19/2019 11:30 AM Heilingoetter, Cassandra L, PA-C [facility] None  09/19/2019 12:30 PM [facility] INFUSION [facility] None  10/10/2019  9:30 AM [facility]FLUSH [facility] None  10/10/2019  9:45 AM [facility] [place] FLUSH [facility] None  10/10/2019 10:15 AM Curt Bears, MD [facility] None  10/10/2019 11:00 AM [facility] INFUSION [facility] None  11/06/2019  9:00 AM Dorothy Spark, MD [facility]  12/03/2019 11:30 AM [name], MD [facility] None  03/04/2020 11:15 AM Liane Comber, NP [facility] None  06/09/2020  9:00 AM [name], MD [facility] None      Subjective:  [name] is a 74 y.o. male who presents for  3 month follow up for HTN, hyperlipidemia, glucose, CKD and vitamin D Def.   He is followed by Dr. Karsten Ro for left renal mass -s/p robotic partial nephrectomy in 02/2018.  In April 2020 he was diagnosed with LUL Adenocarcinoma of Lung with mets to bone and under treatment by Dr Julien Nordmann and on chemotherapy/immunotherapy.  He was incidentally found to have a PE RUL middle artery in 12/2018, poss DVT on 06/2019 CT and is on xarelto 20 mg daily with plan to continue indefinitely.    He is following with Dr. Tamera Punt for R shoulder pain; has been recommended total replacement fur deferring for now, gets injections and takes tylenol/meloxicam, does fairly with recent reduced activity levels.   Has history of  OSA, NOT on CPAP, but no OSA per neuro on most recent reevaluation. Reports he is sleeping better  at night. Taking 2.5 mg valium and sleep supplements, as needed, variable frequency, recently 1-2/week. Has tried trazodone, gabapentin, numerous others in the past and hasn't tolerated.  BMI is Body mass index is 33.05 kg/m., he has been working on diet, trying to cut down on portions, eating small portions every 3 hours to manage nausea; exercise is limited due to dyspnea since lung cancer, has fatigue 2 weeks following infusions.  Wt Readings from Last 3 Encounters:  09/02/19 211 lb (95.7 kg)  08/29/19 210 lb (95.3 kg)  08/21/19 215 lb (97.5 kg)   In 1992 , he had PCA/Stents locally by Dr Wynonia Lawman followed at [facility] for Laser angioplasty. Heart cath in 2012 found patent Stents. Follows with Dr. Meda Coffee.  He has aortic atherosclerosis per numerous CTs He admits has not been checking BPs at home, does have a cuff, off of all meds other than PRN lasix for edema, today their BP is BP: 116/70 He does not workout. He denies chest pain, shortness of breath, dizziness. He does endorse fatigue which he had attributed to cancer.    He is not on cholesterol medication due to hx of reacting very poorly to multiple agents and was hospitalized in the past; he is followed by lipid clinic, currently on zetia, lovaza after declining [id number].  His cholesterol is not at goal of LDL <70. The cholesterol last visit was:   Lab Results  Component Value Date   CHOL 154 06/03/2019   HDL 46 06/03/2019   LDLCALC 81 06/03/2019   LDLDIRECT 116.0 06/03/2013   TRIG 170 (H) 06/03/2019   CHOLHDL 3.3 06/03/2019    He was in DM range several years ago, but with diet is back to normal range.  Lab Results  Component Value Date   HGBA1C 5.1 06/03/2019   Last GFR Lab Results  Component Value Date   GFRNONAA 58 (L) 08/29/2019    Patient is on Vitamin D supplement and above goal at last check, reports was taking 5000 IU BID, reports didn't change dose, was recommended to reduce from  Lab Results  Component Value Date    VD25OH 117 (H) 06/03/2019    He is getting CBC, CMP, TSH rechecked requently via cancer center   Lab Results  Component Value Date   WBC 4.5 08/29/2019   HGB 13.7 08/29/2019   HCT 40.8 08/29/2019   MCV 97.8 08/29/2019   PLT 175 08/29/2019     Medication Review: Current Outpatient Medications on File Prior to Visit  Medication Sig Dispense Refill  . acetaminophen (TYLENOL) 500 MG tablet Take 1 tablet (500 mg total) by mouth every 6 (six) hours as needed. 30 tablet 0  . albuterol (VENTOLIN HFA) 108 (90 Base) MCG/ACT inhaler Inhale 2 puffs into the lungs every 4 (four) hours as needed for wheezing or shortness of breath. 18 g 1  . Alpha-Lipoic Acid 200 MG CAPS Take by mouth 2 (two) times daily.    . Ascorbic Acid (VITAMIN C) 1000 MG tablet Take 1,000 mg by mouth 2 (two) times daily.    . Blood Glucose Monitoring Suppl (FREESTYLE FREEDOM LITE) w/Device KIT Check blood sugar 1 time a day. [id number] 1 each 0  . budesonide-formoterol (SYMBICORT) 160-4.5 MCG/ACT inhaler Use twice daily, wash your mouth afterwards to avoid yeast. 1 Inhaler 12  . Cholecalciferol (VITAMIN D3) 5000 units CAPS Take 5,000 Units by mouth 2 (  two) times daily.    . CHOLINE PO Take 600 mg by mouth daily.    Marland Kitchen CINNAMON PO Take by mouth 2 (two) times daily.    . diazepam (VALIUM) 5 MG tablet Take 1/2 to 1 tablet 2 to 3 x /day as needed for Anxiety 270 tablet 0  . doxycycline (VIBRAMYCIN) 100 MG capsule Take 1 capsule (100 mg total) by mouth 2 (two) times daily. 14 capsule 0  . DOXYLAMINE SUCCINATE PO Take 0.5 tablets by mouth at bedtime.     Marland Kitchen ezetimibe (ZETIA) 10 MG tablet Take 1 tablet (10 mg total) by mouth daily. 90 tablet 3  . folic acid (FOLVITE) 1 MG tablet Take 1 tablet (1 mg total) by mouth daily. 30 tablet 4  . furosemide (LASIX) 80 MG tablet Take 1/2 to 1 tablet 1 to 2 x  /day as Needed for Fluid Retention /  Leg/Ankle Swelling 180 tablet 1  . ipratropium (ATROVENT) 0.03 % nasal spray Place 1-2 sprays into both  nostrils daily as needed (for allergies.). 30 mL 3  . lidocaine-prilocaine (EMLA) cream Apply 1 application topically as needed. 30 g 0  . magnesium oxide (MAG-OX) 400 MG tablet Take 1,200 mg by mouth as needed.    Marland Kitchen MELATONIN PO Take 30 mg by mouth at bedtime.    . meloxicam (MOBIC) 15 MG tablet Take 15 mg by mouth daily.    . Multiple Vitamins-Minerals (MULTIVITAMIN ADULT PO) Take by mouth daily.    . nitroGLYCERIN (NITROSTAT) 0.4 MG SL tablet Dissolve 1 tablet under tongue every 3 to 5 minutes if needed for Chest Pain 50 tablet 3  . omega-3 acid ethyl esters (LOVAZA) 1 g capsule Take 2 capsules (2 g total) by mouth 2 (two) times daily. 360 capsule 3  . OVER THE COUNTER MEDICATION Taking OTC Iron 1 tablet daily.    . prochlorperazine (COMPAZINE) 10 MG tablet Take 1 tablet (10 mg total) by mouth every 6 (six) hours as needed for nausea or vomiting. 30 tablet 0  . SUPER B COMPLEX/C PO Take by mouth daily.    . tamsulosin (FLOMAX) 0.4 MG CAPS capsule Take 1 capsule Daily for for Prostate 90 capsule 3  . tetrahydrozoline-zinc (VISINE-AC) 0.05-0.25 % ophthalmic solution Place 1-2 drops into both eyes 3 (three) times daily as needed (for redness/irritation.).    Marland Kitchen triamcinolone (NASACORT) 55 MCG/ACT AERO nasal inhaler Place 2 sprays into the nose at bedtime. 1 Inhaler 3  . Triamcinolone Acetonide (NASAL ALLERGY 24 HOUR NA) SMARTSIG:2 Spray(s) Both Nares Every Night    . triamcinolone ointment (KENALOG) 0.1 % Apply 1 application topically 2 (two) times daily. 80 g 1  . Turmeric 500 MG TABS Take by mouth. Takes 3 in the morning and 3 in the evening    . XARELTO 20 MG TABS tablet TAKE 1 TABLET BY MOUTH EVERY DAY WITH SUPPER 30 tablet 2  . Zinc 50 MG TABS Take by mouth daily.     No current facility-administered medications on file prior to visit.    Allergies: Allergies  Allergen Reactions  . Codeine Itching and Other (See Comments)    Also hallucinations   . Meloxicam Rash  . Morphine And  Related Other (See Comments)    Hallucinations  . Nsaids Other (See Comments)    BLEEDING--naprosyn  . Oxycodone     Hallucinations  . Crestor [Rosuvastatin] Other (See Comments)    Soreness/aching  . Cymbalta [Duloxetine Hcl] Other (See Comments)    Pt  is unsure of reaction type  . Dilaudid [Hydromorphone Hcl] Itching and Other (See Comments)    Hallucinations.  . Effexor [Venlafaxine] Other (See Comments)    Unsure of reactions type  . Gluten Meal Other (See Comments)    Runny nose (constant); bloating.  . Topamax [Topiramate] Other (See Comments)    Caused visual impairments/swelling in eyes--pinch off optic nerve (temporary blindness)  . Tramadol Hcl   . Trazodone And Nefazodone Other (See Comments)    Unsure of reaction type  . Adhesive [Tape] Other (See Comments)    SKIN PEELS--PAPER TAPE IS OKAY  . Penicillins Rash  . Sulfa Antibiotics Swelling, Rash and Other (See Comments)    Mouth sores    Current Problems (verified) has Hyperlipidemia; Cluster headache syndrome; Essential hypertension; Coronary atherosclerosis; COPD mixed type ([place]); GERD; Lumbago, Chronic; PUD; BPH/Prostatism; Vitamin D deficiency; Medication management; Rhinitis, nonallergic, chronic; Obesity (BMI 30.0-34.9); Encounter for Medicare annual wellness exam; OSA and COPD overlap syndrome ([place]); Aortic atherosclerosis ([place]); Anxiety; Other abnormal glucose; Left renal mass s/p robotic partial nephrectomy 03/02/2018; Recurrent incisional hernia with incarceration s/p lap repair w mesh 03/02/2018; Chronic kidney disease (CKD), stage III (moderate); Renal cell carcinoma, left ([place]); Adenocarcinoma of left lung, stage 4 ([place]); Goals of care, counseling/discussion; Encounter for antineoplastic chemotherapy; Encounter for antineoplastic immunotherapy; Port-A-Cath in place; Pulmonary embolism ([place]); and Statin myopathy on their problem list.  Surgical: He  has a past surgical history that includes Hiatal hernia repair  (1979, spring 2009); right temporal artery biopsy; C5-T6 posterior fusion; Esophageal manometry; Laparoscopic cholecystectomy; Umbilical hernia repair; multiple percutaneous coronary interventions; cataracts; blepheroplasty; Nasal sinus surgery; Hardware Removal (02/20/2012); Rotator cuff surgery (Right); Cardiac catheterization; Cervical spine surgery; Colonoscopy; Upper gi endoscopy; Skin biopsy (Right, 10/31/2017); Transurethral resection of bladder tumor (N/A, 11/03/2017); Back surgery; right wrist plate insertion (85/2778); Ventral hernia repair (N/A, 03/02/2018); Robotic assited partial nephrectomy (Left, 03/02/2018); and IR IMAGING GUIDED PORT INSERTION (10/29/2018). Family His family history includes Coronary artery disease in his father; Gout in his sister; Heart attack in his brother, brother, father, and mother; Heart failure in his mother; Hypertension in his father and mother; Obesity in his mother. Social history  He reports that he quit smoking about 41 years ago. His smoking use included cigarettes. He has a 20.00 pack-year smoking history. He has never used smokeless tobacco. He reports previous alcohol use. He reports that he does not use drugs.  Review of Systems  Constitutional: Positive for malaise/fatigue (fatigue following cancer treatments). Negative for weight loss.  HENT: Negative for hearing loss and tinnitus.   Eyes: Negative for blurred vision and double vision.  Respiratory: Negative for cough, shortness of breath and wheezing.   Cardiovascular: Negative for chest pain, palpitations, orthopnea, claudication and leg swelling.  Gastrointestinal: Negative for abdominal pain, blood in stool, constipation, diarrhea, heartburn, melena, nausea and vomiting.  Genitourinary: Negative.   Musculoskeletal: Positive for joint pain (R shoulder, recently improved). Negative for myalgias.  Skin: Negative for rash.  Neurological: Negative for dizziness, tingling, sensory change, weakness and  headaches.  Endo/Heme/Allergies: Negative for polydipsia.  Psychiatric/Behavioral: Negative.   All other systems reviewed and are negative.    Objective:   Today's Vitals   09/02/19 1025  BP: 116/70  Pulse: 87  Temp: (!) 97.5 F (36.4 C)  SpO2: 95%  Weight: 211 lb (95.7 kg)  Height: 5' 7"  (1.702 m)   Body mass index is 33.05 kg/m.  General appearance: alert, no distress, WD/WN, male HEENT: normocephalic, sclerae anicteric, TMs  pearly, nares patent, no discharge or erythema, pharynx normal Oral cavity: MMM, no lesions Neck: supple, no lymphadenopathy, no thyromegaly, no masses Heart: RRR, normal S1, S2, no murmurs Lungs: CTA bilaterally, no wheezes, rhonchi, or rales Abdomen: +bs, soft, non tender, non distended, no masses, no hepatomegaly, no splenomegaly Musculoskeletal: nontender, no swelling, no obvious deformity Extremities: no edema, no cyanosis, no clubbing Pulses: 2+ symmetric, upper and lower extremities, normal cap refill Neurological: alert, oriented x 3, CN2-12 intact, strength normal upper extremities and lower extremities, sensation normal throughout, DTRs 2+ throughout, no cerebellar signs, gait normal Psychiatric: normal affect, behavior normal, pleasant      Izora Ribas, NP   09/02/2019

## 2019-09-02 ENCOUNTER — Ambulatory Visit (INDEPENDENT_AMBULATORY_CARE_PROVIDER_SITE_OTHER): Payer: Medicare Other | Admitting: Adult Health

## 2019-09-02 ENCOUNTER — Encounter: Payer: Self-pay | Admitting: Adult Health

## 2019-09-02 ENCOUNTER — Other Ambulatory Visit: Payer: Self-pay

## 2019-09-02 VITALS — BP 116/70 | HR 87 | Temp 97.5°F | Ht 67.0 in | Wt 211.0 lb

## 2019-09-02 DIAGNOSIS — E559 Vitamin D deficiency, unspecified: Secondary | ICD-10-CM | POA: Diagnosis not present

## 2019-09-02 DIAGNOSIS — I2510 Atherosclerotic heart disease of native coronary artery without angina pectoris: Secondary | ICD-10-CM

## 2019-09-02 DIAGNOSIS — E785 Hyperlipidemia, unspecified: Secondary | ICD-10-CM

## 2019-09-02 DIAGNOSIS — F419 Anxiety disorder, unspecified: Secondary | ICD-10-CM

## 2019-09-02 DIAGNOSIS — C7951 Secondary malignant neoplasm of bone: Secondary | ICD-10-CM | POA: Diagnosis not present

## 2019-09-02 DIAGNOSIS — Z79899 Other long term (current) drug therapy: Secondary | ICD-10-CM | POA: Diagnosis not present

## 2019-09-02 DIAGNOSIS — E66811 Obesity, class 1: Secondary | ICD-10-CM

## 2019-09-02 DIAGNOSIS — I10 Essential (primary) hypertension: Secondary | ICD-10-CM

## 2019-09-02 DIAGNOSIS — G4733 Obstructive sleep apnea (adult) (pediatric): Secondary | ICD-10-CM | POA: Diagnosis not present

## 2019-09-02 DIAGNOSIS — I700 Atherosclerosis of aorta: Secondary | ICD-10-CM | POA: Diagnosis not present

## 2019-09-02 DIAGNOSIS — C642 Malignant neoplasm of left kidney, except renal pelvis: Secondary | ICD-10-CM

## 2019-09-02 DIAGNOSIS — N183 Chronic kidney disease, stage 3 unspecified: Secondary | ICD-10-CM

## 2019-09-02 DIAGNOSIS — R7309 Other abnormal glucose: Secondary | ICD-10-CM | POA: Diagnosis not present

## 2019-09-02 DIAGNOSIS — E669 Obesity, unspecified: Secondary | ICD-10-CM

## 2019-09-02 DIAGNOSIS — J449 Chronic obstructive pulmonary disease, unspecified: Secondary | ICD-10-CM

## 2019-09-02 DIAGNOSIS — C3492 Malignant neoplasm of unspecified part of left bronchus or lung: Secondary | ICD-10-CM | POA: Diagnosis not present

## 2019-09-02 DIAGNOSIS — I2699 Other pulmonary embolism without acute cor pulmonale: Secondary | ICD-10-CM

## 2019-09-02 NOTE — Patient Instructions (Addendum)
Goals    . Exercise 20-30 min daily     Can break up in small sessions a few min at a time to begin with if needed and gradually increase  Get up at least hourly to walk     . LDL CALC < 70    . Weight (lb) < 180 lb (81.6 kg)         Reduce vitamin D from 14 caps/week to 10 caps/day Daily in morning, every other day in the evening    Vitamin D Deficiency Vitamin D deficiency is when your body does not have enough vitamin D. Vitamin D is important to your body because:  It helps your body use other minerals.  It helps to keep your bones strong and healthy.  It may help to prevent some diseases.  It helps your heart and other muscles work well. Not getting enough vitamin D can make your bones soft. It can also cause other health problems. What are the causes? This condition may be caused by:  Not eating enough foods that contain vitamin D.  Not getting enough sun.  Having diseases that make it hard for your body to absorb vitamin D.  Having a surgery in which a part of the stomach or a part of the small intestine is removed.  Having kidney disease or liver disease. What increases the risk? You are more likely to get this condition if:  You are older.  You do not spend much time outdoors.  You live in a nursing home.  You have had broken bones.  You have weak or thin bones (osteoporosis).  You have a disease or condition that changes how your body absorbs vitamin D.  You have dark skin.  You take certain medicines.  You are overweight or obese. What are the signs or symptoms?  In mild cases, there may not be any symptoms. If the condition is very bad, symptoms may include: ? Bone pain. ? Muscle pain. ? Falling often. ? Broken bones caused by a minor injury. How is this treated? Treatment may include taking supplements as told by your doctor. Your doctor will tell you what dose is best for you. Supplements may include:  Vitamin D.  Calcium. Follow  these instructions at home: Eating and drinking   Eat foods that contain vitamin D, such as: ? Dairy products, cereals, or juices with added vitamin D. Check the label. ? Fish, such as salmon or trout. ? Eggs. ? Oysters. ? Mushrooms. The items listed above may not be a complete list of what you can eat and drink. Contact a dietitian for more options. General instructions  Take medicines and supplements only as told by your doctor.  Get regular, safe exposure to natural sunlight.  Do not use a tanning bed.  Maintain a healthy weight. Lose weight if needed.  Keep all follow-up visits as told by your doctor. This is important. How is this prevented?  You can get vitamin D by: ? Eating foods that naturally contain vitamin D. ? Eating or drinking products that have vitamin D added to them, such as cereals, juices, and milk. ? Taking vitamin D or a multivitamin that contains vitamin D. ? Being in the sun. Your body makes vitamin D when your skin is exposed to sunlight. Your body changes the sunlight into a form of the vitamin that it can use. Contact a doctor if:  Your symptoms do not go away.  You feel sick to your stomach (  nauseous).  You throw up (vomit).  You poop less often than normal, or you have trouble pooping (constipation). Summary  Vitamin D deficiency is when your body does not have enough vitamin D.  Vitamin D helps to keep your bones strong and healthy.  This condition is often treated by taking a supplement.  Your doctor will tell you what dose is best for you. This information is not intended to replace advice given to you by your health care provider. Make sure you discuss any questions you have with your health care provider. Document Revised: 02/05/2018 Document Reviewed: 02/05/2018 Elsevier Patient Education  [place].

## 2019-09-03 LAB — LIPID PANEL
Cholesterol: 138 mg/dL (ref <200)
HDL: 40 mg/dL (ref >=40)
LDL Cholesterol (Calc): 75 mg/dL (calc)
Non-HDL Cholesterol (Calc): 98 mg/dL (calc) (ref <130)
Total CHOL/HDL Ratio: 3.5 (calc) (ref <5.0)
Triglycerides: 145 mg/dL (ref <150)

## 2019-09-03 LAB — MAGNESIUM: Magnesium: 1.8 mg/dL (ref 1.5–2.5)

## 2019-09-10 ENCOUNTER — Ambulatory Visit (INDEPENDENT_AMBULATORY_CARE_PROVIDER_SITE_OTHER): Payer: Medicare Other | Admitting: Internal Medicine

## 2019-09-10 ENCOUNTER — Encounter: Payer: Self-pay | Admitting: Internal Medicine

## 2019-09-10 ENCOUNTER — Other Ambulatory Visit: Payer: Self-pay

## 2019-09-10 VITALS — BP 104/64 | HR 84 | Temp 96.9°F | Resp 16 | Ht 67.0 in | Wt 216.2 lb

## 2019-09-10 DIAGNOSIS — R609 Edema, unspecified: Secondary | ICD-10-CM

## 2019-09-10 DIAGNOSIS — I872 Venous insufficiency (chronic) (peripheral): Secondary | ICD-10-CM

## 2019-09-10 DIAGNOSIS — I2510 Atherosclerotic heart disease of native coronary artery without angina pectoris: Secondary | ICD-10-CM

## 2019-09-10 NOTE — Progress Notes (Signed)
History of Present Illness:     This very nice 74 yo MWM with HTN, ASCAD/CABG, HLD, COPD, OSA, s/p Lt partial Nephrectomy for RCC, and undergoing Chemo for Ca Lung metastatic to bone, GERD, on Xarelto for PE. Patient returns in f/u after recently being started on low dose Lasix for worsening dependent edema. Dosage was increased fron 20 to 40 mg dose and patient has notnoticed any appreciable diuretic response. He denies any CP, palpitations, DOE, Orthopnea or PND. Weight has increased 6 # over the last 12 days.   Wt Readings from Last 3 Encounters:  09/10/19 216 lb 3.2 oz (98.1 kg)  09/02/19 211 lb (95.7 kg)  08/29/19 210 lb (95.3 kg)   Medications  .  ezetimibe (ZETIA) 10 MG tablet, Take 1 tablet daily. .  furosemide (LASIX) 80 MG tablet, Take 1/2  tablet 1 to 2 x  /day  .  NITROSTAT 0.4 MG  if needed for Chest Pain .  LOVAZA 1 g capsule, Take 2 capsules 2  times daily. Marland Kitchen  albuterol HFA  inhaler, Inhale 2 puffs  every 4 hours as needed  .  SYMBICORT 160-4.5  inhaler, Use twice daily .  DOXYLAMINE , Take 0.5 tablets  at bedtime.  .  ATROVENT 0.03 % nasal spray, Place 1-2 sprays into both nostrils daily .  NASACORT  nasal inhaler, Place 2 sprays into the nose at bedtime. Marland Kitchen  acetaminophen  500 MG tablet, Take 1 tablet every 6  hours as needed. .  meloxicam  15 MG tablet, Take  daily. .  folic acid  1 MG tab, Take 1 tablet  daily. Alveda Reasons 20 MG, TAKE 1 TAB EVERY DAY  .  Alpha-Lipoic Acid 200 MG CAPS, Take  2  times daily. Marland Kitchen  VITAMIN C 1000 MG , Take  2  times daily. Marland Kitchen  VITAMIN D 5000 units -Take 10 caps weekly  .  CHOLINE P, Take 600 mg  daily. Marland Kitchen  CINNAMON P, Take 2   times daily. .  diazepam (VALIUM) 5 MG tablet, Take 1/2 to 1 tablet 2 to 3 x /day as needed  .  EMLA cream, Apply 1 application topically as needed. .  magnesium oxide  400 MG  Take 1,200 mg as needed. Marland Kitchen  MELATONIN , Take 30 mg  at bedtime. .  MultiVit-Minerals Take  daily. . OTC Iron 1 tablet daily. .   prochlorperazine 10 MG tablet, Take 1 tablet  every 6 (six) hours as needed for nausea  .  SUPER B COMPLEX/C PO, Take by mouth daily. .  tamsulosin 0.4 MG CAPS , Take 1 capsule Daily for for Prostate .  tetrahydrozoline-zinc (VISINE-AC) 0.05-0.25 % ophthalmic solution, Place 1-2 drops into both eyes 3 (three) times daily as needed (for redness/irritation.). Marland Kitchen  triamcinolone oint 0.1 %, Apply 1 application topically 2  times daily. .  Turmeric 500 MG Takes 3 in the morning and 3 in the evening .  Zinc 50 MG TABS, Take daily.  Problem list He has Hyperlipidemia; Cluster headache syndrome; Essential hypertension; Coronary atherosclerosis; COPD mixed type ([place]); GERD; Lumbago, Chronic; PUD; BPH/Prostatism; Vitamin D deficiency; Medication management; Rhinitis, nonallergic, chronic; Obesity (BMI 30.0-34.9); Encounter for Medicare annual wellness exam; OSA and COPD overlap syndrome ([place]); Aortic atherosclerosis ([place]); Anxiety; Other abnormal glucose; Left renal mass s/p robotic partial nephrectomy 03/02/2018; Recurrent incisional hernia with incarceration s/p lap repair w mesh 03/02/2018; Chronic kidney disease (CKD), stage III (moderate); Renal cell carcinoma, left ([place]); Adenocarcinoma  of left lung, stage 4 ([place]); Goals of care, counseling/discussion; Encounter for antineoplastic chemotherapy; Encounter for antineoplastic immunotherapy; Port-A-Cath in place; Pulmonary embolism ([place]); Statin myopathy; and 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   Observations/Objective:  BP 104/64   Pulse 84   Temp (!) 96.9 F (36.1 C)   Resp 16   Ht 5\' 7"  (1.702 m)   Wt 216 lb 3.2 oz (98.1 kg)   BMI 33.86 kg/m   HEENT - WNL. Neck - supple. No JVD . Car 2+/2+ Chest - Clear equal BS. No Rales / Rhonchi Cor - Nl HS. RRR w/o sig MGR. PP 1(+). 1-2 (+) pitting edema to knees. MS- FROM w/o deformities.  Gait Nl. Neuro -  Nl w/o focal abnormalities. Assessment and Plan:  1. Edema of both  lower legs due to peripheral venous insufficiency  Follow Up Instructions:   - Advised increase his Lasix 80 mg tab to 1 whole tablet 2 x  /day ( 4+ hr between doses)   - ROV - 2 weks to recheck      I discussed the assessment and treatment plan with the patient. The patient was provided an opportunity to ask questions and all were answered. The patient agreed with the plan and demonstrated an understanding of the instructions.   The patient was advised to call back or seek an in-person evaluation if the symptoms worsen or if the condition fails to improve as anticipated.  Kirtland Bouchard, MD

## 2019-09-13 NOTE — Progress Notes (Signed)

## 2019-09-18 NOTE — Progress Notes (Signed)
[name] OFFICE PROGRESS NOTE  [name], [address] [place] [name] [address]  DIAGNOSIS:  1)Stage IV (T1c, N0, M1 C) non-small cell lung cancer, adenocarcinoma presented with left upper lobe lung nodule in addition to metastatic disease to the bone diagnosed in April 2020. 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  Molecular studies by Guardant 106 STK11Splice Site SNV 2.6% Everolimus,Temsirolimus [id number] 0.8% None (VUS),No (VUS) NTRK3 L629L 1.5% (Synonymous)No (Synonymous) KRAS G12D 1.1% Binimetinib CDKN2A H83Y 1.6% Abemaciclib, Palbociclib, Ribociclib  PRIOR THERAPY: None  CURRENT THERAPY: Systemic chemotherapy with carboplatin for AUC of 5, Alimta 500 mg/M2 and Keytruda 200 mg IV every 3 weeks. First dose Oct 16, 2018.Status post16cyclesof treatment.Starting from cycle #5 the patient has been on maintenance Alimta and Keytruda.  INTERVAL HISTORY: [name] 74y.o. male returns to clinic today for follow-up visit.  The patient continues to feel fatigued and experienced generalized weakness/deconditioning. We discussed physical therapy and if that is something he would be interested in. He seemed interested in this option.  At his last appointment, the patient had a restaging CT scan which noted an increase in the left upper lobe pulmonary nodule.  Dr. MJulien Nordmannoffered the patient to continue on the same treatment with close monitoring or consideration of SBRT.  He also discussed discontinuing Alimta and continuing on single agent Keytruda; however, his PDL1 is 0%. The patient decided to proceed with treatment and make a decision based on his next restaging CT scan in 9 weeks or so.   Otherwise, he is feeling fair.  He denies any recent fever, chills, night sweats, or weight loss.  He denies any chest pain or hemoptysis but endorses  baseline shortness of breath with exertion and occasional dry cough. When he feels short of breath, he has a routine of using his inhaler and a "mist" spray.  He denies any nausea, vomiting, diarrhea, or constipation.  He denies any headache or visual changes.  He denies any rashes or skin changes.  He has been having bilateral lower extremity swelling for which he is currently on Lasix. His lasix dose was increased to 80 mg BID last week. He was scheduled to have a repeat CMP this week and a follow up with his PCP. He states his swelling is better but not where he would like it to be.  He is here today for evaluation before starting cycle #17.   MEDICAL HISTORY: Past Medical History:  Diagnosis Date  . [place]inal pain ([place]   . Anxiety    treated /w Xanax for mild depression , using 2 times per day, not PRN  . Asthma   . Benign prostatic hypertrophy   . CAD (coronary artery disease)    pt last left heart cath was in Nov 2008. EF was 55% on left ventriculogram. Circumflex was totally occluded after the 1st obtuse marginal with collaterals supplying the distal circumflex. LAD showed luminal irregularities. The stent in LAD was patent. The RCA showed luminal irregularities. The stent in th emid RCA and PDA were patent. There were no inverventions.   . Chest pain    from anxiety last time 1 month ago  . Chromophobe renal cell carcinoma ([place] 02[place]   s/p left kidney wedge resection  . Chronic obstructive pulmonary disease (HCC)    asthma  . Cluster headache    relative to sinus problems none recnt  .  Constipation   . Depression   . DM2 (diabetes mellitus, type 2) ([place])    well with diet and exercise controlled  . GERD (gastroesophageal reflux disease)    hiatal hernia. Patient did have a Nissen fundoplication rare  . Heart murmur    heard 30 yrs ago  . History of blood transfusion    need for Bld. transfusion relative to taking NSAIDS  . HTN (hypertension)    pt. followed by [facility], last cardiac visit 2012, one yr. ago  . Hyperlipidemia   . Joint pain   . Lactose intolerance   . Left kidney mass   . Low back pain    chronic  . Metastatic non-small cell lung cancer (HCC)    adenocarcinoma, LUL, bone metastasis in 09/2018  . Neuromuscular disorder ([place])    nerve involvement in back & upper back relative to hardware in neck   . Obesity   . OSA and COPD overlap syndrome ([place]) 02/25/2015   no cpap used pt denies sleep apnea  . Osteoarthritis   . Peptic ulcer disease   . Pneumonia not recent per pt  . 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 Sleep concern    states the study (2003 )was done at [facility] it failed & he was told to return & he never did. Pt. states he has been found by prev. hosp. staff that he has to be told when to breathe & his wife does the same.   Marland Kitchen Spinal fracture    hx of traumatic in Jun 04, 2007 after falling off the roof  . Tinnitus, bilateral   . Vitamin D deficiency     ALLERGIES:  is allergic to codeine; meloxicam; morphine and related; nsaids; oxycodone; crestor [rosuvastatin]; cymbalta [duloxetine hcl]; dilaudid [hydromorphone hcl]; effexor [venlafaxine]; gluten meal; topamax [topiramate]; tramadol hcl; trazodone and nefazodone; adhesive [tape]; penicillins; and sulfa antibiotics.  MEDICATIONS:  Current Outpatient Medications  Medication Sig Dispense Refill  . acetaminophen (TYLENOL) 500 MG tablet Take 1 tablet (500 mg total) by mouth every 6 (six) hours as needed. 30 tablet 0  . albuterol (VENTOLIN HFA) 108 (90 Base) MCG/ACT inhaler Inhale 2 puffs into the lungs every 4 (four) hours as needed for wheezing or shortness of breath. 18 g 1  . Alpha-Lipoic Acid 200 MG CAPS Take by mouth 2 (two) times daily.    . Ascorbic Acid (VITAMIN C) 1000 MG tablet Take 1,000 mg by mouth 2 (two) times daily.    . Blood Glucose Monitoring Suppl (FREESTYLE FREEDOM LITE) w/Device KIT Check blood  sugar 1 time a day. [id number] 1 each 0  . budesonide-formoterol (SYMBICORT) 160-4.5 MCG/ACT inhaler Use twice daily, wash your mouth afterwards to avoid yeast. 1 Inhaler 12  . Cholecalciferol (VITAMIN D3) 5000 units CAPS Take 5,000 Units by mouth See admin instructions. Take 10 caps weekly     . CHOLINE PO Take 600 mg by mouth daily.    Marland Kitchen CINNAMON PO Take by mouth 2 (two) times daily.    . diazepam (VALIUM) 5 MG tablet Take 1/2 to 1 tablet 2 to 3 x /day as needed for Anxiety 270 tablet 0  . DOXYLAMINE SUCCINATE PO Take 0.5 tablets by mouth at bedtime.     Marland Kitchen ezetimibe (ZETIA) 10 MG tablet Take 1 tablet (10 mg total) by mouth daily. 90 tablet 3  . folic acid (FOLVITE) 1 MG tablet Take 1  tablet (1 mg total) by mouth daily. 30 tablet 4  . furosemide (LASIX) 80 MG tablet Take 1/2 to 1 tablet 1 to 2 x  /day as Needed for Fluid Retention /  Leg/Ankle Swelling 180 tablet 1  . ipratropium (ATROVENT) 0.03 % nasal spray Place 1-2 sprays into both nostrils daily as needed (for allergies.). 30 mL 3  . lidocaine-prilocaine (EMLA) cream Apply 1 application topically as needed. 30 g 0  . magnesium oxide (MAG-OX) 400 MG tablet Take 1,200 mg by mouth as needed.    Marland Kitchen MELATONIN PO Take 30 mg by mouth at bedtime.    . meloxicam (MOBIC) 15 MG tablet Take 15 mg by mouth daily.    . Multiple Vitamins-Minerals (MULTIVITAMIN ADULT PO) Take by mouth daily.    . nitroGLYCERIN (NITROSTAT) 0.4 MG SL tablet Dissolve 1 tablet under tongue every 3 to 5 minutes if needed for Chest Pain 50 tablet 3  . omega-3 acid ethyl esters (LOVAZA) 1 g capsule Take 2 capsules (2 g total) by mouth 2 (two) times daily. 360 capsule 3  . OVER THE COUNTER MEDICATION Taking OTC Iron 1 tablet daily.    . prochlorperazine (COMPAZINE) 10 MG tablet Take 1 tablet (10 mg total) by mouth every 6 (six) hours as needed for nausea or vomiting. 30 tablet 0  . SUPER B COMPLEX/C PO Take by mouth daily.    . tamsulosin (FLOMAX) 0.4 MG CAPS capsule Take 1 capsule  Daily for for Prostate 90 capsule 3  . tetrahydrozoline-zinc (VISINE-AC) 0.05-0.25 % ophthalmic solution Place 1-2 drops into both eyes 3 (three) times daily as needed (for redness/irritation.).    Marland Kitchen triamcinolone (NASACORT) 55 MCG/ACT AERO nasal inhaler Place 2 sprays into the nose at bedtime. 1 Inhaler 3  . Triamcinolone Acetonide (NASAL ALLERGY 24 HOUR NA) SMARTSIG:2 Spray(s) Both Nares Every Night    . triamcinolone ointment (KENALOG) 0.1 % Apply 1 application topically 2 (two) times daily. 80 g 1  . Turmeric 500 MG TABS Take by mouth. Takes 3 in the morning and 3 in the evening    . XARELTO 20 MG TABS tablet TAKE 1 TABLET BY MOUTH EVERY DAY WITH SUPPER 30 tablet 2  . Zinc 50 MG TABS Take by mouth daily.     No current facility-administered medications for this visit.    SURGICAL HISTORY:  Past Surgical History:  Procedure Laterality Date  . BACK SURGERY     x3- last surgery lumbar- 1998- / fusion   . blepheroplasty     both eyesx2  . C5-T6 posterior fusion     with [place]asis and radius screws  . [place], 2008 & 2012 multiple stents  total 4 times  . cataracts     cataracts removed- /w IOL - both eyes   . CERVICAL SPINE SURGERY    . COLONOSCOPY    . ESOPHAGEAL MANOMETRY    . HARDWARE REMOVAL  02/20/2012   Procedure: HARDWARE REMOVAL;  Surgeon: [name], MD;  Location: [facility];  Service: Neurosurgery;  Laterality: N/A;  Hardware Removal  . HIATAL HERNIA REPAIR  1979, spring 2009  . IR IMAGING GUIDED PORT INSERTION  10/29/2018  . LAPAROSCOPIC CHOLECYSTECTOMY     & IOC  . multiple percutaneous coronary interventions    . NASAL SINUS SURGERY     x2, sees Dr. [name], still having problems, states he uses Benadryl PRN- up to 5 times per day   . right temporal  artery biopsy    . right wrist plate insertion  [id number]  . ROBOTIC ASSITED PARTIAL NEPHRECTOMY Left 03/02/2018   Procedure: XI ROBOTIC ASSITED LEFT PARTIAL NEPHRECTOMY WITH LYSIS OF ADHESIONS X30  MINUTES.;  Surgeon: Ardis Hughs, MD;  Location: [facility];  Service: Urology;  Laterality: Left;  CLAMP TIME FOR KIDNEY 1053-1110 TOTAL 18MINUTES  . Rotator cuff surgery Right   . SKIN BIOPSY Right 10/31/2017   Chest  . TRANSURETHRAL RESECTION OF BLADDER TUMOR N/A 11/03/2017   Procedure: cystoscopy;  Surgeon: Kathie Rhodes, MD;  Location: [facility];  Service: Urology;  Laterality: N/A;  . UMBILICAL HERNIA REPAIR    . UPPER GI ENDOSCOPY    . VENTRAL HERNIA REPAIR N/A 03/02/2018   Procedure: LAPAROSCOPIC REPAIR OF INCARCERATED INCISIONAL HERNIA WITH LYSIS OF ADHESIONS;  Surgeon: Michael Boston, MD;  Location: [facility];  Service: General;  Laterality: N/A;    REVIEW OF SYSTEMS:   Review of Systems  Constitutional: Positive for fatigue and generalized weakness. Negative for appetite change, chills, fever and unexpected weight change.  HENT: Negative for mouth sores, nosebleeds, sore throat and trouble swallowing.   Eyes: Negative for eye problems and icterus.  Respiratory: Positive for baseline shortness of breath with exertion and occasional dry cough. Negative for hemoptysis and wheezing.   Cardiovascular: positive for leg swelling. Negative for chest pain Gastrointestinal: Negative for abdominal pain, constipation, diarrhea, nausea and vomiting.  Genitourinary: Negative for bladder incontinence, difficulty urinating, dysuria, frequency and hematuria.   Musculoskeletal: Negative for back pain, gait problem, neck pain and neck stiffness.  Skin: Negative for itching and rash.  Neurological: Negative for dizziness, extremity weakness, gait problem, headaches, light-headedness and seizures.  Hematological: Negative for adenopathy. Does not bruise/bleed easily.  Psychiatric/Behavioral: Negative for confusion, depression and sleep disturbance. The patient is not nervous/anxious.     PHYSICAL EXAMINATION:  There were no vitals taken for this visit.  ECOG PERFORMANCE STATUS: 1 - Symptomatic but  completely ambulatory  Physical Exam  Constitutional: Oriented to person, place, and time and well-developed, well-nourished, and in no distress.  HENT:  Head: Normocephalic and atraumatic.  Mouth/Throat: Oropharynx is clear and moist. No oropharyngeal exudate.  Eyes: Conjunctivae are normal. Right eye exhibits no discharge. Left eye exhibits no discharge. No scleral icterus.  Neck: Normal range of motion. Neck supple.  Cardiovascular: Normal rate, regular rhythm, normal heart sounds and intact distal pulses.   Pulmonary/Chest: Effort normal and breath sounds normal. No respiratory distress. No wheezes. No rales.  Abdominal: Soft. Bowel sounds are normal. Exhibits no distension and no mass. There is no tenderness.  Musculoskeletal: Bilateral lower extremity edema. Normal range of motion.   Lymphadenopathy:    No cervical adenopathy.  Neurological: Alert and oriented to person, place, and time. Exhibits normal muscle tone. Gait normal. Coordination normal.  Skin: Skin is warm and dry. No rash noted. Not diaphoretic. No erythema. No pallor.  Psychiatric: Mood, memory and judgment normal.  Vitals reviewed.  LABORATORY DATA: Lab Results  Component Value Date   WBC 4.5 08/29/2019   HGB 13.7 08/29/2019   HCT 40.8 08/29/2019   MCV 97.8 08/29/2019   PLT 175 08/29/2019      Chemistry      Component Value Date/Time   NA 135 08/29/2019 1124   K 4.0 08/29/2019 1124   CL 98 08/29/2019 1124   CO2 27 08/29/2019 1124   BUN 30 (H) 08/29/2019 1124   CREATININE 1.21 08/29/2019 1124   CREATININE 1.10 11/28/2018 0959  Component Value Date/Time   CALCIUM 9.1 08/29/2019 1124   ALKPHOS 109 08/29/2019 1124   AST 31 08/29/2019 1124   ALT 26 08/29/2019 1124   BILITOT 0.6 08/29/2019 1124       RADIOGRAPHIC STUDIES:  CT Chest W Contrast  Result Date: 08/27/2019 CLINICAL DATA:  Non-small-cell lung cancer. Restaging. EXAM: CT CHEST, ABDOMEN, AND PELVIS WITH CONTRAST TECHNIQUE:  Multidetector CT imaging of the chest, abdomen and pelvis was performed following the standard protocol during bolus administration of intravenous contrast. CONTRAST:  157m OMNIPAQUE IOHEXOL 300 MG/ML  SOLN COMPARISON:  06/25/2019 FINDINGS: CT CHEST FINDINGS Cardiovascular: The heart size is normal. No substantial pericardial effusion. Coronary artery calcification is evident. Atherosclerotic calcification is noted in the wall of the thoracic aorta. Right Port-A-Cath tip is positioned at the SVC/RA junction. Mediastinum/Nodes: No mediastinal lymphadenopathy. There is no hilar lymphadenopathy. The esophagus has normal imaging features. There is no axillary lymphadenopathy. Lungs/Pleura: Spiculated left upper lobe pulmonary nodule has progressed in the interval, measuring 1.5 x 1.3 cm today (image 36/series 6) compared to 1.2 x 1.0 cm when I remeasure in a similar fashion on the prior study. Centrilobular emphsyema noted. No new suspicious pulmonary nodule or mass. No focal airspace consolidation. Chronic atelectasis or linear scarring noted posterior lower lungs bilaterally. Musculoskeletal: No worrisome lytic or sclerotic osseous abnormality. Extensive cervicothoracic spinal fusion hardware evident. CT ABDOMEN PELVIS FINDINGS Hepatobiliary: No suspicious focal abnormality within the liver parenchyma. Gallbladder is surgically absent. No intrahepatic or extrahepatic biliary dilation. Pancreas: No focal mass lesion. No dilatation of the main duct. No intraparenchymal cyst. No peripancreatic edema. Spleen: No splenomegaly. No focal mass lesion. Adrenals/Urinary Tract: No adrenal nodule or mass. Right kidney unremarkable. 3 mm nonobstructing stone noted lower pole left kidney with stable exophytic 2.1 cm low-density lesion noted at the lower pole. No evidence for hydroureter. The urinary bladder appears normal for the degree of distention. Stomach/Bowel: Surgical changes noted around the esophagogastric junction  suggest prior fundoplication. Duodenum is normally positioned as is the ligament of Treitz. No small bowel wall thickening. No small bowel dilatation. The terminal ileum is normal. The appendix is not visualized, but there is no edema or inflammation in the region of the cecum. No gross colonic mass. No colonic wall thickening. Diverticular changes are noted in the left colon without evidence of diverticulitis. Vascular/Lymphatic: There is abdominal aortic atherosclerosis without aneurysm. There is no gastrohepatic or hepatoduodenal ligament lymphadenopathy. No retroperitoneal or mesenteric lymphadenopathy. No pelvic sidewall lymphadenopathy. Reproductive: The prostate gland and seminal vesicles are unremarkable. Other: No intraperitoneal free fluid. Musculoskeletal: Sclerotic lesions in the posterior iliac bones bilaterally are stable as are the posterior iliac lytic lesions. Left iliac lesion measured previously at 3.8 cm is stable, measuring 3.7 cm today. Lumbar fusion hardware again noted. IMPRESSION: 1. Mild interval progression of the spiculated left upper lobe pulmonary nodule. 2. Otherwise, no new or progressive findings in the chest, abdomen, or pelvis. 3. Stable appearance of sclerotic and lucent bilateral iliac bone lesions. 4. Nonobstructing left renal stone with stable exophytic low-density lesion lower pole left kidney, likely a cyst. 5. Aortic Atherosclerosis (ICD10-I70.0) and Emphysema (ICD10-J43.9). Electronically Signed   By: EMisty StanleyM.D.   On: 08/27/2019 12:37   CT Abdomen Pelvis W Contrast  Result Date: 08/27/2019 CLINICAL DATA:  Non-small-cell lung cancer. Restaging. EXAM: CT CHEST, ABDOMEN, AND PELVIS WITH CONTRAST TECHNIQUE: Multidetector CT imaging of the chest, abdomen and pelvis was performed following the standard protocol during bolus administration of intravenous contrast.  CONTRAST:  147m OMNIPAQUE IOHEXOL 300 MG/ML  SOLN COMPARISON:  06/25/2019 FINDINGS: CT CHEST FINDINGS  Cardiovascular: The heart size is normal. No substantial pericardial effusion. Coronary artery calcification is evident. Atherosclerotic calcification is noted in the wall of the thoracic aorta. Right Port-A-Cath tip is positioned at the SVC/RA junction. Mediastinum/Nodes: No mediastinal lymphadenopathy. There is no hilar lymphadenopathy. The esophagus has normal imaging features. There is no axillary lymphadenopathy. Lungs/Pleura: Spiculated left upper lobe pulmonary nodule has progressed in the interval, measuring 1.5 x 1.3 cm today (image 36/series 6) compared to 1.2 x 1.0 cm when I remeasure in a similar fashion on the prior study. Centrilobular emphsyema noted. No new suspicious pulmonary nodule or mass. No focal airspace consolidation. Chronic atelectasis or linear scarring noted posterior lower lungs bilaterally. Musculoskeletal: No worrisome lytic or sclerotic osseous abnormality. Extensive cervicothoracic spinal fusion hardware evident. CT ABDOMEN PELVIS FINDINGS Hepatobiliary: No suspicious focal abnormality within the liver parenchyma. Gallbladder is surgically absent. No intrahepatic or extrahepatic biliary dilation. Pancreas: No focal mass lesion. No dilatation of the main duct. No intraparenchymal cyst. No peripancreatic edema. Spleen: No splenomegaly. No focal mass lesion. Adrenals/Urinary Tract: No adrenal nodule or mass. Right kidney unremarkable. 3 mm nonobstructing stone noted lower pole left kidney with stable exophytic 2.1 cm low-density lesion noted at the lower pole. No evidence for hydroureter. The urinary bladder appears normal for the degree of distention. Stomach/Bowel: Surgical changes noted around the esophagogastric junction suggest prior fundoplication. Duodenum is normally positioned as is the ligament of Treitz. No small bowel wall thickening. No small bowel dilatation. The terminal ileum is normal. The appendix is not visualized, but there is no edema or inflammation in the region  of the cecum. No gross colonic mass. No colonic wall thickening. Diverticular changes are noted in the left colon without evidence of diverticulitis. Vascular/Lymphatic: There is abdominal aortic atherosclerosis without aneurysm. There is no gastrohepatic or hepatoduodenal ligament lymphadenopathy. No retroperitoneal or mesenteric lymphadenopathy. No pelvic sidewall lymphadenopathy. Reproductive: The prostate gland and seminal vesicles are unremarkable. Other: No intraperitoneal free fluid. Musculoskeletal: Sclerotic lesions in the posterior iliac bones bilaterally are stable as are the posterior iliac lytic lesions. Left iliac lesion measured previously at 3.8 cm is stable, measuring 3.7 cm today. Lumbar fusion hardware again noted. IMPRESSION: 1. Mild interval progression of the spiculated left upper lobe pulmonary nodule. 2. Otherwise, no new or progressive findings in the chest, abdomen, or pelvis. 3. Stable appearance of sclerotic and lucent bilateral iliac bone lesions. 4. Nonobstructing left renal stone with stable exophytic low-density lesion lower pole left kidney, likely a cyst. 5. Aortic Atherosclerosis (ICD10-I70.0) and Emphysema (ICD10-J43.9). Electronically Signed   By: EMisty StanleyM.D.   On: 08/27/2019 12:37     ASSESSMENT/PLAN:  This is a very pleasant 74year old Caucasian male diagnosed with stage IV non-small cell lung cancer, adenocarcinoma. He presented with a left upper lobe lung nodule in addition to metastatic bone disease. He was diagnosed in April 2020. His PDL 1 expression is negative and he has no actionable mutations. He also has a history of renal cell carcinoma. He is status post wedge resection to the left kidney which was performed in September 2019.  The patient is currently undergoing palliative systemic chemotherapy with carboplatin for an AUC of 5, Alimta500 mg/m, and Keytruda 200 mg IV every 3 weeks. The patient is status post16cycles of treatment. Starting  from cycle #5, the patient has been on maintenance Alimta and Keytruda.He tolerated his treatment well without any adverse  effects except for some fatigue.   At the patient's last appointment he was given the option of continuing on the same treatment with close monitoring of a increase in the left upper lobe nodule vs. discontinuing treatment and proceeding on palliative care/hospice care vs. consideration of SBRT vs. continuing on single agent Keytruda although the patient's PD-L1 expression is negative.  The patient was seen with Dr. Julien Nordmann today. The patient opted to continue on the same treatment and decide based on his next restaging CT scan in a couple of weeks.   Due to the patient's elevated creatinine, which is likely due to his lasix, we will reduce the dose of his Alimta today to 400 mg/m2. He has a meeting with his PCP later today to discuss his swelling and lasix. Gave the patient a copy of his labs to discuss at his appointment later.   Regarding his generalized weakness, I provided the patient information about the free physical therapy classes for lung cancer survivors offered at [facility].   For the incidental finding of pulmonary embolism, he is currently on Xarelto 20 mg p.o. daily. He will continue with the same treatment for now. Denies any bleeding or bruising.  The patient was advised to call immediately if he has any concerning symptoms in the interval. The patient voices understanding of current disease status and treatment options and is in agreement with the current care plan. All questions were answered. The patient knows to call the clinic with any problems, questions or concerns. We can certainly see the patient much sooner if necessary       No orders of the defined types were placed in this encounter.    Manasi Dishon L Travelle Mcclimans, PA-C 09/18/19  ADDENDUM: Hematology/Oncology Attending: I had a face-to-face encounter with the patient today.  I recommended  his care plan.  This is a very pleasant 74 years old white male with metastatic non-small cell lung cancer, adenocarcinoma status post induction treatment with carboplatin, Alimta and Keytruda and he is currently on maintenance treatment and Keytruda status post 16 cycles.  The patient has been tolerating this treatment well except for the fatigue.  He also has swelling of the lower extremities and was seen recently by his primary care physician who increased his dose of Lasix but this resulted in the renal insufficiency and increase in his serum creatinine. The patient is interested in continuing his current treatment for now until the next scan before making any decision.  He will proceed with cycle #17 today as planned. I will reduce his dose of Alimta to 400 mg/M2 for this cycle because of the renal insufficiency. The patient will come back for follow-up visit in 3 weeks for evaluation before the next cycle of his treatment. For the incidental pulmonary embolus he will continue his current treatment with Xarelto. The patient was advised to call immediately if he has any other concerning symptoms in the interval.  Disclaimer: This note was dictated with voice recognition software. Similar sounding words can inadvertently be transcribed and may be missed upon review. Eilleen Kempf, MD 09/19/19

## 2019-09-19 ENCOUNTER — Ambulatory Visit (INDEPENDENT_AMBULATORY_CARE_PROVIDER_SITE_OTHER): Payer: Medicare Other | Admitting: Internal Medicine

## 2019-09-19 ENCOUNTER — Inpatient Hospital Stay: Payer: Medicare Other | Attending: Internal Medicine | Admitting: Physician Assistant

## 2019-09-19 ENCOUNTER — Encounter: Payer: Self-pay | Admitting: Physician Assistant

## 2019-09-19 ENCOUNTER — Inpatient Hospital Stay: Payer: Medicare Other

## 2019-09-19 ENCOUNTER — Other Ambulatory Visit: Payer: Self-pay

## 2019-09-19 ENCOUNTER — Encounter: Payer: Self-pay | Admitting: Internal Medicine

## 2019-09-19 VITALS — BP 100/52 | HR 80 | Temp 97.6°F | Resp 16 | Ht 67.0 in | Wt 211.0 lb

## 2019-09-19 VITALS — HR 91

## 2019-09-19 VITALS — BP 107/74 | HR 102 | Temp 98.3°F | Resp 17 | Ht 67.0 in | Wt 209.3 lb

## 2019-09-19 DIAGNOSIS — C7951 Secondary malignant neoplasm of bone: Secondary | ICD-10-CM | POA: Diagnosis not present

## 2019-09-19 DIAGNOSIS — R7989 Other specified abnormal findings of blood chemistry: Secondary | ICD-10-CM | POA: Diagnosis not present

## 2019-09-19 DIAGNOSIS — Z79899 Other long term (current) drug therapy: Secondary | ICD-10-CM | POA: Insufficient documentation

## 2019-09-19 DIAGNOSIS — I2510 Atherosclerotic heart disease of native coronary artery without angina pectoris: Secondary | ICD-10-CM

## 2019-09-19 DIAGNOSIS — M6281 Muscle weakness (generalized): Secondary | ICD-10-CM | POA: Insufficient documentation

## 2019-09-19 DIAGNOSIS — Z5111 Encounter for antineoplastic chemotherapy: Secondary | ICD-10-CM | POA: Diagnosis not present

## 2019-09-19 DIAGNOSIS — I872 Venous insufficiency (chronic) (peripheral): Secondary | ICD-10-CM | POA: Diagnosis not present

## 2019-09-19 DIAGNOSIS — Z95828 Presence of other vascular implants and grafts: Secondary | ICD-10-CM

## 2019-09-19 DIAGNOSIS — R609 Edema, unspecified: Secondary | ICD-10-CM | POA: Diagnosis not present

## 2019-09-19 DIAGNOSIS — R5382 Chronic fatigue, unspecified: Secondary | ICD-10-CM

## 2019-09-19 DIAGNOSIS — C3492 Malignant neoplasm of unspecified part of left bronchus or lung: Secondary | ICD-10-CM

## 2019-09-19 DIAGNOSIS — C3412 Malignant neoplasm of upper lobe, left bronchus or lung: Secondary | ICD-10-CM | POA: Insufficient documentation

## 2019-09-19 DIAGNOSIS — I10 Essential (primary) hypertension: Secondary | ICD-10-CM | POA: Diagnosis not present

## 2019-09-19 DIAGNOSIS — Z5112 Encounter for antineoplastic immunotherapy: Secondary | ICD-10-CM | POA: Diagnosis not present

## 2019-09-19 DIAGNOSIS — R600 Localized edema: Secondary | ICD-10-CM

## 2019-09-19 LAB — CMP (CANCER CENTER ONLY)
ALT: 23 U/L (ref 0–44)
AST: 31 U/L (ref 15–41)
Albumin: 3.3 g/dL — ABNORMAL LOW (ref 3.5–5.0)
Alkaline Phosphatase: 96 U/L (ref 38–126)
Anion gap: 11 (ref 5–15)
BUN: 53 mg/dL — ABNORMAL HIGH (ref 8–23)
CO2: 29 mmol/L (ref 22–32)
Calcium: 9.3 mg/dL (ref 8.9–10.3)
Chloride: 95 mmol/L — ABNORMAL LOW (ref 98–111)
Creatinine: 1.59 mg/dL — ABNORMAL HIGH (ref 0.61–1.24)
GFR, Est AFR Am: 48 mL/min — ABNORMAL LOW (ref >60)
GFR, Estimated: 42 mL/min — ABNORMAL LOW (ref >60)
Glucose, Bld: 111 mg/dL — ABNORMAL HIGH (ref 70–99)
Potassium: 3.9 mmol/L (ref 3.5–5.1)
Sodium: 135 mmol/L (ref 135–145)
Total Bilirubin: 0.5 mg/dL (ref 0.3–1.2)
Total Protein: 7.4 g/dL (ref 6.5–8.1)

## 2019-09-19 LAB — CBC WITH DIFFERENTIAL (CANCER CENTER ONLY)
Abs Immature Granulocytes: 0.02 10*3/uL (ref 0.00–0.07)
Basophils Absolute: 0 10*3/uL (ref 0.0–0.1)
Basophils Relative: 1 %
Eosinophils Absolute: 0.1 10*3/uL (ref 0.0–0.5)
Eosinophils Relative: 2 %
HCT: 38.3 % — ABNORMAL LOW (ref 39.0–52.0)
Hemoglobin: 13 g/dL (ref 13.0–17.0)
Immature Granulocytes: 0 %
Lymphocytes Relative: 21 %
Lymphs Abs: 1.5 10*3/uL (ref 0.7–4.0)
MCH: 32.3 pg (ref 26.0–34.0)
MCHC: 33.9 g/dL (ref 30.0–36.0)
MCV: 95 fL (ref 80.0–100.0)
Monocytes Absolute: 0.8 10*3/uL (ref 0.1–1.0)
Monocytes Relative: 11 %
Neutro Abs: 4.7 10*3/uL (ref 1.7–7.7)
Neutrophils Relative %: 65 %
Platelet Count: 174 10*3/uL (ref 150–400)
RBC: 4.03 MIL/uL — ABNORMAL LOW (ref 4.22–5.81)
RDW: 14.6 % (ref 11.5–15.5)
WBC Count: 7.2 10*3/uL (ref 4.0–10.5)
nRBC: 0 % (ref 0.0–0.2)

## 2019-09-19 LAB — TSH: TSH: 1.16 u[IU]/mL (ref 0.320–4.118)

## 2019-09-19 MED ORDER — PROCHLORPERAZINE MALEATE 10 MG PO TABS
10.0000 mg | ORAL_TABLET | Freq: Once | ORAL | Status: AC
  Administered 2019-09-19: 10 mg via ORAL

## 2019-09-19 MED ORDER — SODIUM CHLORIDE 0.9% FLUSH
10.0000 mL | INTRAVENOUS | Status: DC | PRN
  Administered 2019-09-19: 10 mL
  Filled 2019-09-19: qty 10

## 2019-09-19 MED ORDER — SODIUM CHLORIDE 0.9% FLUSH
10.0000 mL | Freq: Once | INTRAVENOUS | Status: AC
  Administered 2019-09-19: 10 mL
  Filled 2019-09-19: qty 10

## 2019-09-19 MED ORDER — SODIUM CHLORIDE 0.9 % IV SOLN
200.0000 mg | Freq: Once | INTRAVENOUS | Status: AC
  Administered 2019-09-19: 200 mg via INTRAVENOUS
  Filled 2019-09-19: qty 8

## 2019-09-19 MED ORDER — SODIUM CHLORIDE 0.9 % IV SOLN
Freq: Once | INTRAVENOUS | Status: AC
  Filled 2019-09-19: qty 250

## 2019-09-19 MED ORDER — SODIUM CHLORIDE 0.9 % IV SOLN
400.0000 mg/m2 | Freq: Once | INTRAVENOUS | Status: AC
  Administered 2019-09-19: 800 mg via INTRAVENOUS
  Filled 2019-09-19: qty 20

## 2019-09-19 MED ORDER — HEPARIN SOD (PORK) LOCK FLUSH 100 UNIT/ML IV SOLN
500.0000 [IU] | Freq: Once | INTRAVENOUS | Status: AC | PRN
  Administered 2019-09-19: 500 [IU]
  Filled 2019-09-19: qty 5

## 2019-09-19 MED ORDER — PROCHLORPERAZINE MALEATE 10 MG PO TABS
ORAL_TABLET | ORAL | Status: AC
  Filled 2019-09-19: qty 1

## 2019-09-19 NOTE — Progress Notes (Signed)
Subjective:    Patient ID: [name], male    DOB: Nov 09, 1945, 74 y.o.   MRN: [id number]  HPI   Patient is a nice 73 yo MWM undergoing Chemo for metastatic Lung Ca with recent problems with worsening LE edema and having recently increased his lasix dosing from 20 mg to 40 mg and over the last 10 days to 80 mg. Weight is down 7# over the last 10 days.  Labs done at the Cancer center over the last 3 weeks  To this mornings lab show Pre-renal volume contraction. BUN has risen from 36 to 53,  Creatinine from 1.21 to 1.59 and GFR has dropped from 58 to 42. Patient admits feeling occasional light-headed.   Wt Readings from Last 3 Encounters:  09/19/19 211 lb (95.7 kg)  09/10/19 216 lb 3.2 oz (98.1 kg)   Medication Sig  . acetaminophen (TYLENOL) 500 MG tablet Take 1 tablet (500 mg total) by mouth every 6 (six) hours as needed.  Marland Kitchen albuterol (VENTOLIN HFA) 108 (90 Base) MCG/ACT inhaler Inhale 2 puffs into the lungs every 4 (four) hours as needed for wheezing or shortness of breath.  . Alpha-Lipoic Acid 200 MG CAPS Take by mouth 2 (two) times daily.  . Ascorbic Acid (VITAMIN C) 1000 MG tablet Take 1,000 mg by mouth 2 (two) times daily.  . budesonide-formoterol (SYMBICORT) 160-4.5 MCG/ACT inhaler Use twice daily, wash your mouth afterwards to avoid yeast.  . Cholecalciferol (VITAMIN D3) 5000 units CAPS Take 10 caps weekly   . CHOLINE PO Take 600 mg by mouth daily.  Marland Kitchen CINNAMON PO Take by mouth 3 (three) times daily.   . diazepam (VALIUM) 5 MG tablet Take 1/2 to 1 tablet 2 to 3 x /day as needed   . DOXYLAMINE SUCCINATE PO Take 0.5 tablets by mouth at bedtime.   Marland Kitchen ezetimibe (ZETIA) 10 MG tablet Take 1 tablet (10 mg total) by mouth daily.  . folic acid (FOLVITE) 1 MG tablet Take 1 tablet (1 mg total) by mouth daily.  . furosemide (LASIX) 80 MG tablet Take 1/2 to 1 tablet 1 to 2 x  /day as Needed for Fluid Retention /  Leg/Ankle Swelling (Patient taking differently: 80 mg 2 (two) times daily.  Take 1/2 to 1 tablet 1 to 2 x  /day as Needed for Fluid Retention /  Leg/Ankle Swelling)  . ipratropium (ATROVENT) 0.03 % nasal spray Place 1-2 sprays into both nostrils daily as needed (for allergies.).  Marland Kitchen lidocaine-prilocaine (EMLA) cream Apply 1 application topically as needed.  . magnesium oxide (MAG-OX) 400 MG tablet Take 1,200 mg by mouth as needed.  Marland Kitchen MELATONIN PO Take 30 mg by mouth at bedtime.  . meloxicam (MOBIC) 15 MG tablet Take 15 mg by mouth daily.  . Multiple Vitamins-Minerals (MULTIVITAMIN ADULT PO) Take by mouth daily.  . nitroGLYCERIN (NITROSTAT) 0.4 MG SL tablet Dissolve 1 tablet under tongue every 3 to 5 minutes if needed for Chest Pain  . omega-3 acid ethyl esters (LOVAZA) 1 g capsule Take 2 capsules (2 g total) by mouth 2 (two) times daily.  Marland Kitchen OVER THE COUNTER MEDICATION Taking OTC Iron 1 tablet daily.  . prochlorperazine (COMPAZINE) 10 MG tablet Take 1 tablet (10 mg total) by mouth every 6 (six) hours as needed for nausea or vomiting.  . SUPER B COMPLEX/C PO Take by mouth daily.  . tamsulosin (FLOMAX) 0.4 MG CAPS capsule Take 1 capsule Daily for for Prostate  . tetrahydrozoline-zinc (VISINE-AC) 0.05-0.25 %  ophthalmic solution Place 1-2 drops into both eyes 3 (three) times daily as needed (for redness/irritation.).  Marland Kitchen triamcinolone (NASACORT) 55 MCG/ACT AERO nasal inhaler Place 2 sprays into the nose at bedtime.  . triamcinolone ointment (KENALOG) 0.1 % Apply 1 application topically 2 (two) times daily.  . Turmeric 500 MG TABS Take by mouth. Takes 3 in the morning and 3 in the evening  . XARELTO 20 MG TABS tablet TAKE 1 TABLET BY MOUTH EVERY DAY WITH SUPPER  . Zinc 50 MG TABS Take by mouth daily.   Medication Dose  . pembrolizumab (KEYTRUDA) 200 mg chemo infusion  200 mg  . PEMEtrexed (ALIMTA) 800 mg infusion  400 mg/m2    Allergies  Allergen Reactions  . Codeine Itching and Other (See Comments)    Also hallucinations   . Meloxicam Rash  . Morphine And Related Other  (See Comments)    Hallucinations  . Nsaids Other (See Comments)    BLEEDING--naprosyn  . Oxycodone     Hallucinations  . Crestor [Rosuvastatin] Other (See Comments)    Soreness/aching  . Cymbalta [Duloxetine Hcl] Other (See Comments)    Pt is unsure of reaction type  . Dilaudid [Hydromorphone Hcl] Itching and Other (See Comments)    Hallucinations.  . Effexor [Venlafaxine] Other (See Comments)    Unsure of reactions type  . Gluten Meal Other (See Comments)    Runny nose (constant); bloating.  . Topamax [Topiramate] Other (See Comments)    Caused visual impairments/swelling in eyes--pinch off optic nerve (temporary blindness)  . Tramadol Hcl   . Trazodone And Nefazodone Other (See Comments)    Unsure of reaction type  . Adhesive [Tape] Other (See Comments)    SKIN PEELS--PAPER TAPE IS OKAY  . Penicillins Rash  . Sulfa Antibiotics Swelling, Rash and Other (See Comments)    Mouth sores   Past Medical History:  Diagnosis Date  . Anginal pain ([place])   . Anxiety    treated /w Xanax for mild depression , using 2 times per day, not PRN  . Asthma   . Benign prostatic hypertrophy   . CAD (coronary artery disease)    pt last left heart cath was in Nov 2008. EF was 55% on left ventriculogram. Circumflex was totally occluded after the 1st obtuse marginal with collaterals supplying the distal circumflex. LAD showed luminal irregularities. The stent in LAD was patent. The RCA showed luminal irregularities. The stent in th emid RCA and PDA were patent. There were no inverventions.   . Chest pain    from anxiety last time 1 month ago  . 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 Chronic obstructive pulmonary disease (HCC)    asthma  . Cluster headache    relative to sinus problems none recnt  . Constipation   . Depression   . DM2 (diabetes mellitus, type 2) ([place])    well with diet and exercise controlled  . GERD (gastroesophageal reflux disease)     hiatal hernia. Patient did have a Nissen fundoplication rare  . Heart murmur    heard 30 yrs ago  . History of blood transfusion    need for Bld. transfusion relative to taking NSAIDS  . HTN (hypertension)    pt. followed by [facility], last cardiac visit 2012, one yr. ago  . Hyperlipidemia   . Joint pain   . Lactose intolerance   . Left kidney mass   . Low back pain  chronic  . Metastatic non-small cell lung cancer (HCC)    adenocarcinoma, LUL, bone metastasis in 09/2018  . Neuromuscular disorder ([place])    nerve involvement in back & upper back relative to hardware in neck   . Obesity   . OSA and COPD overlap syndrome ([place]) 02/25/2015   no cpap used pt denies sleep apnea  . Osteoarthritis   . Peptic ulcer disease   . Pneumonia not recent per pt  . 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 Sleep concern    states the study (2003 )was done at [facility] it failed & he was told to return & he never did. Pt. states he has been found by prev. hosp. staff that he has to be told when to breathe & his wife does the same.   Marland Kitchen Spinal fracture    hx of traumatic in Jun 04, 2007 after falling off the roof  . Tinnitus, bilateral   . Vitamin D deficiency    Past Surgical History:  Procedure Laterality Date  . BACK SURGERY     x3- last surgery lumbar- 1998- / fusion   . blepheroplasty     both eyesx2  . C5-T6 posterior fusion     with [place] and radius screws  . [place], 2008 & 2012 multiple stents  total 4 times  . cataracts     cataracts removed- /w IOL - both eyes   . CERVICAL SPINE SURGERY    . COLONOSCOPY    . ESOPHAGEAL MANOMETRY    . HARDWARE REMOVAL  02/20/2012   Procedure: HARDWARE REMOVAL;  Surgeon: [name], MD;  Location: [facility];  Service: Neurosurgery;  Laterality: N/A;  Hardware Removal  . HIATAL HERNIA REPAIR  1979, spring 2009  . IR IMAGING GUIDED PORT INSERTION  10/29/2018  .  LAPAROSCOPIC CHOLECYSTECTOMY     & IOC  . multiple percutaneous coronary interventions    . NASAL SINUS SURGERY     x2, sees Dr. [name], still having problems, states he uses Benadryl PRN- up to 5 times per day   . right temporal artery biopsy    . right wrist plate insertion  40/9811  . ROBOTIC ASSITED PARTIAL NEPHRECTOMY Left 03/02/2018   Procedure: XI ROBOTIC ASSITED LEFT PARTIAL NEPHRECTOMY WITH LYSIS OF ADHESIONS X30 MINUTES.;  Surgeon: Ardis Hughs, MD;  Location: [facility];  Service: Urology;  Laterality: Left;  CLAMP TIME FOR KIDNEY 1053-1110 TOTAL 18MINUTES  . Rotator cuff surgery Right   . SKIN BIOPSY Right 10/31/2017   Chest  . TRANSURETHRAL RESECTION OF BLADDER TUMOR N/A 11/03/2017   Procedure: cystoscopy;  Surgeon: Kathie Rhodes, MD;  Location: [facility];  Service: Urology;  Laterality: N/A;  . UMBILICAL HERNIA REPAIR    . UPPER GI ENDOSCOPY    . VENTRAL HERNIA REPAIR N/A 03/02/2018   Procedure: LAPAROSCOPIC REPAIR OF INCARCERATED INCISIONAL HERNIA WITH LYSIS OF ADHESIONS;  Surgeon: Michael Boston, MD;  Location: [facility];  Service: General;  Laterality: N/A;   Review of Systems   10 point systems review negative except as above.    Objective:   Physical Exam  BP  100/52   P 80   T 97.6 F    R 16   Ht 5\' 7"     Wt 211 lb    BMI 33.05  Postural       sit BP  103/72    P 96            &         Stand BP 104/63     P  102  HEENT - WNL. Neck - supple.  Chest - Clear equal BS. Cor - Nl HS. RRR w/o sig MGR. PP 1(+). No edema. MS- FROM w/o deformities.  Gait Nl. Neuro -  Nl w/o focal abnormalities.    Assessment & Plan:   1. Edema of both lower legs due to peripheral venous insufficiency  2. Prerenal azotemia  3. Essential hypertension  - Advise taper his Lasix 80 mg bid down to qd and encouraged liberal water to a goal of  100 oz /day. Recc return in 1 week for lab visit and 2 weeks for OV.   (Patient's next chemo infusion due in 3 weeks).

## 2019-09-19 NOTE — Patient Instructions (Signed)
[email or web address]

## 2019-09-19 NOTE — Patient Instructions (Signed)
COVID-19 Vaccine Information can be found at: [email or web address] For questions related to vaccine distribution or appointments, please email [email or web address] or call [phone].   [facility] Discharge Instructions for Patients Receiving Chemotherapy  Today you received the following chemotherapy agents: Pembrolizumab (Keytruda) and Pemetrexed (Alimta)  To help prevent nausea and vomiting after your treatment, we encourage you to take your nausea medication as directed by your provider.   If you develop nausea and vomiting that is not controlled by your nausea medication, call the clinic.   BELOW ARE SYMPTOMS THAT SHOULD BE REPORTED IMMEDIATELY:  *FEVER GREATER THAN 100.5 F  *CHILLS WITH OR WITHOUT FEVER  NAUSEA AND VOMITING THAT IS NOT CONTROLLED WITH YOUR NAUSEA MEDICATION  *UNUSUAL SHORTNESS OF BREATH  *UNUSUAL BRUISING OR BLEEDING  TENDERNESS IN MOUTH AND THROAT WITH OR WITHOUT PRESENCE OF ULCERS  *URINARY PROBLEMS  *BOWEL PROBLEMS  UNUSUAL RASH Items with * indicate a potential emergency and should be followed up as soon as possible.  Feel free to call the clinic should you have any questions or concerns. The clinic phone number is [phone].  Please show the [facility] at check-in to the Emergency Department and triage nurse.  Coronavirus (COVID-19) Are you at risk?  Are you at risk for the Coronavirus (COVID-19)?  To be considered HIGH RISK for Coronavirus (COVID-19), you have to meet the following criteria:  . Traveled to [place], [place], [place], [place] or [place]; or in the [place] to [place], [place], [place], or [place]; and have fever, cough, and shortness of breath within the last 2 weeks of travel OR . Been in close contact with a person diagnosed with COVID-19 within the last 2 weeks and have fever, cough, and shortness of breath . IF YOU DO NOT MEET  THESE CRITERIA, YOU ARE CONSIDERED LOW RISK FOR COVID-19.  What to do if you are HIGH RISK for COVID-19?  Marland Kitchen If you are having a medical emergency, call 911. . Seek medical care right away. Before you go to a doctor's office, urgent care or emergency department, call ahead and tell them about your recent travel, contact with someone diagnosed with COVID-19, and your symptoms. You should receive instructions from your physician's office regarding next steps of care.  . When you arrive at healthcare provider, tell the healthcare staff immediately you have returned from visiting [place], [place], [place], [place] or [place]; or traveled in the [place] to [place], [place], [place], or [place]; in the last two weeks or you have been in close contact with a person diagnosed with COVID-19 in the last 2 weeks.   . Tell the health care staff about your symptoms: fever, cough and shortness of breath. . After you have been seen by a medical provider, you will be either: o Tested for (COVID-19) and discharged home on quarantine except to seek medical care if symptoms worsen, and asked to  - Stay home and avoid contact with others until you get your results (4-5 days)  - Avoid travel on public transportation if possible (such as bus, train, or airplane) or o Sent to the Emergency Department by EMS for evaluation, COVID-19 testing, and possible admission depending on your condition and test results.  What to do if you are LOW RISK for COVID-19?  Reduce your risk of any infection by using the same precautions used for avoiding the common cold or flu:  Marland Kitchen Wash your hands often with soap and warm  water for at least 20 seconds.  If soap and water are not readily available, use an alcohol-based hand sanitizer with at least 60% alcohol.  . If coughing or sneezing, cover your mouth and nose by coughing or sneezing into the elbow areas of your shirt or coat, into a tissue or into your sleeve (not your  hands). . Avoid shaking hands with others and consider head nods or verbal greetings only. . Avoid touching your eyes, nose, or mouth with unwashed hands.  . Avoid close contact with people who are sick. . Avoid places or events with large numbers of people in one location, like concerts or sporting events. . Carefully consider travel plans you have or are making. . If you are planning any travel outside or inside the [name], visit the CDC's Travelers' Health webpage for the latest health notices. . If you have some symptoms but not all symptoms, continue to monitor at home and seek medical attention if your symptoms worsen. . If you are having a medical emergency, call 911.   [place] / e-Visit: [email or web address]         [facility]: [facility]: [phone]                   [facility]: [phone]

## 2019-09-20 ENCOUNTER — Telehealth: Payer: Self-pay | Admitting: Physician Assistant

## 2019-09-20 NOTE — Telephone Encounter (Signed)
Scheduled per los. Called and left msg. mailed printout  

## 2019-09-23 ENCOUNTER — Other Ambulatory Visit: Payer: Self-pay | Admitting: Medical

## 2019-09-23 DIAGNOSIS — R600 Localized edema: Secondary | ICD-10-CM

## 2019-09-26 ENCOUNTER — Other Ambulatory Visit: Payer: Medicare Other

## 2019-09-26 ENCOUNTER — Other Ambulatory Visit: Payer: Self-pay

## 2019-09-26 DIAGNOSIS — I10 Essential (primary) hypertension: Secondary | ICD-10-CM

## 2019-09-26 DIAGNOSIS — R7989 Other specified abnormal findings of blood chemistry: Secondary | ICD-10-CM | POA: Diagnosis not present

## 2019-09-26 DIAGNOSIS — I872 Venous insufficiency (chronic) (peripheral): Secondary | ICD-10-CM | POA: Diagnosis not present

## 2019-09-26 DIAGNOSIS — R609 Edema, unspecified: Secondary | ICD-10-CM | POA: Diagnosis not present

## 2019-09-27 LAB — BASIC METABOLIC PANEL WITH GFR
BUN/Creatinine Ratio: 36 (calc) — ABNORMAL HIGH (ref 6–22)
BUN: 50 mg/dL — ABNORMAL HIGH (ref 7–25)
CO2: 28 mmol/L (ref 20–32)
Calcium: 9.1 mg/dL (ref 8.6–10.3)
Chloride: 96 mmol/L — ABNORMAL LOW (ref 98–110)
Creat: 1.37 mg/dL — ABNORMAL HIGH (ref 0.70–1.18)
GFR, Est African American: 58 mL/min/{1.73_m2} — ABNORMAL LOW (ref >=60)
GFR, Est Non African American: 50 mL/min/{1.73_m2} — ABNORMAL LOW (ref >=60)
Glucose, Bld: 102 mg/dL — ABNORMAL HIGH (ref 65–99)
Potassium: 3.5 mmol/L (ref 3.5–5.3)
Sodium: 135 mmol/L (ref 135–146)

## 2019-10-01 NOTE — Progress Notes (Signed)
RESCHEDULED

## 2019-10-02 ENCOUNTER — Ambulatory Visit: Payer: Medicare Other | Admitting: Internal Medicine

## 2019-10-02 DIAGNOSIS — R7989 Other specified abnormal findings of blood chemistry: Secondary | ICD-10-CM

## 2019-10-02 DIAGNOSIS — I872 Venous insufficiency (chronic) (peripheral): Secondary | ICD-10-CM

## 2019-10-02 DIAGNOSIS — I10 Essential (primary) hypertension: Secondary | ICD-10-CM

## 2019-10-02 DIAGNOSIS — Z79899 Other long term (current) drug therapy: Secondary | ICD-10-CM

## 2019-10-02 NOTE — Progress Notes (Signed)
History of Present Illness:                                                      This very nice 74 yo MWM undergoing Chemo for metastatic Lung Ca has been having worsening dependent edema and several schedules of diuretics have been change to manage his edema at the expense of worsening Pre-renal edema.   - Patient was advised taper his Lasix 80 mg bid down to qd and encouraged liberal water to a goal of 100 oz /day.  On 4/15/, his kidney functions ( BUN/Creat = 50/1.37 and GFR 50 were slightly improved from 1 week previously & he was advised to taper his Lasix 80 mg to 1/2 tab once  Daily, but apparently he misunderstood & never tapered the Lasix.  - Today he's also c/o sinus pressure, congestion and putrid nasal drainage.    Medications   Current Outpatient Medications (Cardiovascular):  .  ezetimibe (ZETIA) 10 MG tablet, Take 1 tablet (10 mg total) by mouth daily. .  furosemide (LASIX) 80 MG tablet, Take 1/2 to 1 tablet 1 to 2 x  /day as Needed for Fluid Retention /  Leg/Ankle Swelling (Patient taking differently: 80 mg 2 (two) times daily. Take 1/2 to 1 tablet 1 to 2 x  /day as Needed for Fluid Retention /  Leg/Ankle Swelling) .  nitroGLYCERIN (NITROSTAT) 0.4 MG SL tablet, Dissolve 1 tablet under tongue every 3 to 5 minutes if needed for Chest Pain .  omega-3 acid ethyl esters (LOVAZA) 1 g capsule, Take 2 capsules (2 g total) by mouth 2 (two) times daily.  Current Outpatient Medications (Respiratory):  .  albuterol (VENTOLIN HFA) 108 (90 Base) MCG/ACT inhaler, Inhale 2 puffs into the lungs every 4 (four) hours as needed for wheezing or shortness of breath. .  budesonide-formoterol (SYMBICORT) 160-4.5 MCG/ACT inhaler, Use twice daily, wash your mouth afterwards to avoid yeast. .  DOXYLAMINE SUCCINATE PO, Take 0.5 tablets by mouth at bedtime.  Marland Kitchen  ipratropium (ATROVENT) 0.03 % nasal spray, Place 1-2 sprays into both nostrils daily as needed (for allergies.). Marland Kitchen  triamcinolone  (NASACORT) 55 MCG/ACT AERO nasal inhaler, Place 2 sprays into the nose at bedtime.  Current Outpatient Medications (Analgesics):  .  acetaminophen (TYLENOL) 500 MG tablet, Take 1 tablet (500 mg total) by mouth every 6 (six) hours as needed. .  meloxicam (MOBIC) 15 MG tablet, Take 15 mg by mouth daily.  Current Outpatient Medications (Hematological):  .  folic acid (FOLVITE) 1 MG tablet, Take 1 tablet (1 mg total) by mouth daily. Alveda Reasons 20 MG TABS tablet, TAKE 1 TABLET BY MOUTH EVERY DAY WITH SUPPER  Current Outpatient Medications (Other):  Marland Kitchen  Alpha-Lipoic Acid 200 MG CAPS, Take by mouth 2 (two) times daily. .  Ascorbic Acid (VITAMIN C) 1000 MG tablet, Take 1,000 mg by mouth 2 (two) times daily. .  Blood Glucose Monitoring Suppl (FREESTYLE FREEDOM LITE) w/Device KIT, Check blood sugar 1 time a day. [id number] .  Cholecalciferol (VITAMIN D3) 5000 units CAPS, Take 5,000 Units by mouth See admin instructions. Take 10 caps weekly  .  CHOLINE PO, Take 600 mg by mouth daily. Marland Kitchen  CINNAMON PO, Take by mouth 3 (three) times daily.  .  diazepam (VALIUM) 5 MG tablet, Take 1/2 to 1 tablet 2  to 3 x /day as needed for Anxiety .  lidocaine-prilocaine (EMLA) cream, Apply 1 application topically as needed. .  magnesium oxide (MAG-OX) 400 MG tablet, Take 1,200 mg by mouth as needed. Marland Kitchen  MELATONIN PO, Take 30 mg by mouth at bedtime. .  Multiple Vitamins-Minerals (MULTIVITAMIN ADULT PO), Take by mouth daily. Marland Kitchen  OVER THE COUNTER MEDICATION, Taking OTC Iron 1 tablet daily. .  prochlorperazine (COMPAZINE) 10 MG tablet, Take 1 tablet (10 mg total) by mouth every 6 (six) hours as needed for nausea or vomiting. .  SUPER B COMPLEX/C PO, Take by mouth daily. .  tamsulosin (FLOMAX) 0.4 MG CAPS capsule, Take 1 capsule Daily for for Prostate .  tetrahydrozoline-zinc (VISINE-AC) 0.05-0.25 % ophthalmic solution, Place 1-2 drops into both eyes 3 (three) times daily as needed (for redness/irritation.). Marland Kitchen  triamcinolone  ointment (KENALOG) 0.1 %, Apply 1 application topically 2 (two) times daily. .  Turmeric 500 MG TABS, Take by mouth. Takes 3 in the morning and 3 in the evening .  Zinc 50 MG TABS, Take by mouth daily.  Problem list He has Hyperlipidemia; Cluster headache syndrome; Essential hypertension; Coronary atherosclerosis; COPD mixed type ([place]); GERD; Lumbago, Chronic; PUD; BPH/Prostatism; Vitamin D deficiency; Medication management; Rhinitis, nonallergic, chronic; Obesity (BMI 30.0-34.9); OSA and COPD overlap syndrome ([place]); Aortic atherosclerosis ([place]); Left renal mass s/p robotic partial nephrectomy 03/02/2018; Recurrent incisional hernia with incarceration s/p lap repair w mesh 03/02/2018; Chronic kidney disease (CKD), stage III (moderate); Renal cell carcinoma, left ([place]); Adenocarcinoma of left lung, stage 4 ([place]); Goals of care, counseling/discussion; Encounter for antineoplastic chemotherapy; Encounter for antineoplastic immunotherapy; Port-A-Cath in place; Pulmonary embolism ([place]); Statin myopathy; and 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  Observations/Objective:  BP 102/64   Pulse 96   Temp (!) 97.2 F (36.2 C)   Resp 16   Ht 5' 7"  (1.702 m)   Wt 209 lb 6.4 oz (95 kg)   BMI 32.80 kg/m   HEENT - EAC's/TM's Nl. (+) Frontal /Maxillary tenderness. Neck - supple.  Chest - Clear equal BS. Cor - Nl HS. RRR w/o sig MGR. PP 1(+). No edema. MS- FROM w/o deformities.  Gait Nl. Neuro -  Nl w/o focal abnormalities.  Assessment and Plan:  1. Essential hypertension  - BASIC METABOLIC PANEL WITH GFR - Magnesium  2. Edema of both lower legs due to peripheral venous insufficiency  - BASIC METABOLIC PANEL WITH GFR - Magnesium  3. Prerenal azotemia  - BASIC METABOLIC PANEL WITH GFR - Magnesium   4. Subacute pansinusitis  - predniSONE (DELTASONE) 20 MG tablet; 1 tab 3 x day for 2 days, then 1 tab 2 x day for 2 days, then 1 tab 1 x day for 3 days  Dispense: 13  tablets  - azithromycin (ZITHROMAX) 250 MG tablet; Take 2 tablets with Food on  Day 1, then 1 tablet Daily with Food for Infection  Dispense: 6 each; Refill: 1  5. Medication management  - BASIC METABOLIC PANEL WITH GFR - Magnesium  I discussed the assessment and treatment plan with the patient. The patient was provided an opportunity to ask questions and all were answered. The patient agreed with the plan and demonstrated an understanding of the instructions.  The patient was advised to call back or seek an in-person evaluation if the symptoms worsen or if the condition fails to improve as anticipated.   Kirtland Bouchard,

## 2019-10-03 ENCOUNTER — Ambulatory Visit (INDEPENDENT_AMBULATORY_CARE_PROVIDER_SITE_OTHER): Payer: Medicare Other | Admitting: Internal Medicine

## 2019-10-03 ENCOUNTER — Encounter: Payer: Self-pay | Admitting: Internal Medicine

## 2019-10-03 ENCOUNTER — Other Ambulatory Visit: Payer: Self-pay

## 2019-10-03 VITALS — BP 102/64 | HR 96 | Temp 97.2°F | Resp 16 | Ht 67.0 in | Wt 209.4 lb

## 2019-10-03 DIAGNOSIS — I872 Venous insufficiency (chronic) (peripheral): Secondary | ICD-10-CM

## 2019-10-03 DIAGNOSIS — J0140 Acute pansinusitis, unspecified: Secondary | ICD-10-CM | POA: Diagnosis not present

## 2019-10-03 DIAGNOSIS — I2510 Atherosclerotic heart disease of native coronary artery without angina pectoris: Secondary | ICD-10-CM | POA: Diagnosis not present

## 2019-10-03 DIAGNOSIS — Z79899 Other long term (current) drug therapy: Secondary | ICD-10-CM

## 2019-10-03 DIAGNOSIS — R609 Edema, unspecified: Secondary | ICD-10-CM

## 2019-10-03 DIAGNOSIS — I10 Essential (primary) hypertension: Secondary | ICD-10-CM

## 2019-10-03 DIAGNOSIS — R7989 Other specified abnormal findings of blood chemistry: Secondary | ICD-10-CM | POA: Diagnosis not present

## 2019-10-03 MED ORDER — PREDNISONE 20 MG PO TABS: ORAL_TABLET | ORAL | 0 refills | Status: DC

## 2019-10-03 MED ORDER — AZITHROMYCIN 250 MG PO TABS: ORAL_TABLET | ORAL | 1 refills | Status: DC

## 2019-10-04 LAB — BASIC METABOLIC PANEL WITH GFR
BUN/Creatinine Ratio: 30 (calc) — ABNORMAL HIGH (ref 6–22)
BUN: 46 mg/dL — ABNORMAL HIGH (ref 7–25)
CO2: 30 mmol/L (ref 20–32)
Calcium: 9.4 mg/dL (ref 8.6–10.3)
Chloride: 94 mmol/L — ABNORMAL LOW (ref 98–110)
Creat: 1.52 mg/dL — ABNORMAL HIGH (ref 0.70–1.18)
GFR, Est African American: 51 mL/min/{1.73_m2} — ABNORMAL LOW (ref >=60)
GFR, Est Non African American: 44 mL/min/{1.73_m2} — ABNORMAL LOW (ref >=60)
Glucose, Bld: 123 mg/dL — ABNORMAL HIGH (ref 65–99)
Potassium: 3.7 mmol/L (ref 3.5–5.3)
Sodium: 136 mmol/L (ref 135–146)

## 2019-10-04 LAB — MAGNESIUM: Magnesium: 2.2 mg/dL (ref 1.5–2.5)

## 2019-10-04 NOTE — Progress Notes (Signed)

## 2019-10-07 ENCOUNTER — Telehealth: Payer: Self-pay | Admitting: Medical Oncology

## 2019-10-07 NOTE — Telephone Encounter (Signed)
Okay.  But no guarantee of his cancer would stay stable in the interval.  Thank you.

## 2019-10-07 NOTE — Telephone Encounter (Signed)
No need for scan before his next visit on May 20.  Thank you.

## 2019-10-07 NOTE — Telephone Encounter (Signed)
Pt.notified

## 2019-10-07 NOTE — Telephone Encounter (Signed)
He wants "to skip his next treatment on April 29th and get my strength back".  Is that okay with Dr Julien Nordmann?  Next appt would be May 20th .   Does he need CT scan before appt on 5/20?

## 2019-10-07 NOTE — Telephone Encounter (Signed)
Ct expected date-Since he is delaying cycle 18, does Dr Julien Nordmann want CT before  Cycle 18 or before cycle 19? There are  no order for CTs

## 2019-10-08 NOTE — Telephone Encounter (Signed)
Pt.notified

## 2019-10-10 ENCOUNTER — Ambulatory Visit: Payer: Medicare Other | Admitting: Internal Medicine

## 2019-10-10 ENCOUNTER — Other Ambulatory Visit: Payer: Medicare Other

## 2019-10-10 ENCOUNTER — Ambulatory Visit: Payer: Medicare Other

## 2019-10-21 ENCOUNTER — Telehealth: Payer: Self-pay | Admitting: Medical Oncology

## 2019-10-21 ENCOUNTER — Other Ambulatory Visit: Payer: Self-pay | Admitting: Internal Medicine

## 2019-10-21 MED ORDER — MELOXICAM 15 MG PO TABS: ORAL_TABLET | ORAL | 0 refills | Status: AC

## 2019-10-21 NOTE — Telephone Encounter (Signed)
Faxed records to [facility].

## 2019-10-25 NOTE — Progress Notes (Signed)
Pharmacist Chemotherapy Monitoring - Follow Up Assessment    I verify that I have reviewed each item in the below checklist:  . Regimen for the patient is scheduled for the appropriate day and plan matches scheduled date. Marland Kitchen Appropriate non-routine labs are ordered dependent on drug ordered. . If applicable, additional medications reviewed and ordered per protocol based on lifetime cumulative doses and/or treatment regimen.   Plan for follow-up and/or issues identified: No . I-vent associated with next due treatment: No . MD and/or nursing notified: No  [name] K 10/25/2019 9:29 AM

## 2019-10-28 ENCOUNTER — Telehealth: Payer: Self-pay | Admitting: Medical Oncology

## 2019-10-28 NOTE — Telephone Encounter (Signed)
Please cancel appts on 10/31/19-he has a PET scan scheduled for a second opinion.  Appts cancelled.He said he will call back about future appts.

## 2019-10-31 ENCOUNTER — Other Ambulatory Visit: Payer: Medicare Other

## 2019-10-31 ENCOUNTER — Ambulatory Visit: Payer: Medicare Other

## 2019-10-31 ENCOUNTER — Ambulatory Visit: Payer: Medicare Other | Admitting: Internal Medicine

## 2019-10-31 DIAGNOSIS — C3431 Malignant neoplasm of lower lobe, right bronchus or lung: Secondary | ICD-10-CM | POA: Diagnosis not present

## 2019-10-31 DIAGNOSIS — E041 Nontoxic single thyroid nodule: Secondary | ICD-10-CM | POA: Diagnosis not present

## 2019-10-31 DIAGNOSIS — M899 Disorder of bone, unspecified: Secondary | ICD-10-CM | POA: Diagnosis not present

## 2019-10-31 DIAGNOSIS — C3492 Malignant neoplasm of unspecified part of left bronchus or lung: Secondary | ICD-10-CM | POA: Diagnosis not present

## 2019-11-01 ENCOUNTER — Other Ambulatory Visit: Payer: Medicare Other

## 2019-11-01 ENCOUNTER — Ambulatory Visit: Payer: Medicare Other

## 2019-11-01 ENCOUNTER — Ambulatory Visit: Payer: Medicare Other | Admitting: Physician Assistant

## 2019-11-03 NOTE — Progress Notes (Signed)
History of Present Illness:     This very nice 74 yo MWMundergoing Chemo for metastatic Stage 4 Lung Ca (dx'd 2020)  returns for 1 month f/u of mgmt of.refractory edema with increasing doses of Lasix his GFR had dropped from>60 (Normal)  in Feb to last GFR 44 - Stage 3b  CKD.  Patient has been on several schedules of his Lasix and has on occasion misunderstood recommended dosing schedule. Currently , he's taking Furosemide 80 mg daily. Patient has ASCAD s/p stents has has Statin Myopathy. In Lipid clinic, Vascepa was recommended, but patient could not afford co-pays.      Today, he's also c/o intermittent Left Hip Pains. Denies injury. Medications    ezetimibe (ZETIA) 10 MG tablet, Take 1 tablet  daily.   furosemide  80 MG , Take  1 tablet 1 x /day as Needed for Fluid Retention    NITROSTAT 0.4 MG SL tablet if needed for Chest Pain   LOVAZA 1 g capsule, Take 2 capsules  2  times daily.   albuterol HFA inhaler, Inhale 2 puffs into the lungs every 4  hours as needed    SYMBICORT 160-4.5  inhaler, Use twice daily   DOXYLAMINE, Take 0.5 tablets  at bedtime.    ATROVENT 0.03 % nasal , Place 1-2 sprays into nostrils daily as needed   NASACORT AERO nasal inhaler, Place 2 sprays into the nose at bedtime.   acetaminophen 500 MG tablet, Take 1 tablet every 6 hours as needed.   meloxicam 15 MG tablet, Take 1/2 to 1 tablet Daily for    folic acid 1 MG tablet, Take 1 tablet (1 mg total) by mouth daily.   XARELTO 20 MG TABS tablet, TAKE 1 TABLET BY MOUTH EVERY DAY WITH SUPPER   Alpha-Lipoic Acid 200 MG CAPS, Take  2 times daily.  VITAMIN C 1000 MG tablet, Take 1,000 mg by mouth 2 (two) times daily.   azithromycin (ZITHROMAX) 250 MG tablet, Take 2 tablets with Food on  Day 1, then 1 tablet Daily with Food for Infection   Blood Glucose Monitoring Suppl (FREESTYLE FREEDOM LITE) w/Device KIT, Check blood sugar 1 time a day. [id number]   Cholecalciferol (VITAMIN D3) 5000 units CAPS, Take  5,000 Units by mouth See admin instructions. Take 10 caps weekly    CHOLINE PO, Take 600 mg by mouth daily.   CINNAMON PO, Take by mouth 3 (three) times daily.    diazepam (VALIUM) 5 MG tablet, Take 1/2 to 1 tablet 2 to 3 x /day as needed for Anxiety   lidocaine-prilocaine (EMLA) cream, Apply 1 application topically as needed.   magnesium oxide (MAG-OX) 400 MG tablet, Take 1,200 mg by mouth as needed.   MELATONIN PO, Take 30 mg by mouth at bedtime.   Multiple Vitamins-Minerals (MULTIVITAMIN ADULT PO), Take by mouth daily.   OVER THE COUNTER MEDICATION, Taking OTC Iron 1 tablet daily.   OVER THE COUNTER MEDICATION, OTC Nasacrom NS 1 spray each nostril 1 to 2 times daily.   prochlorperazine (COMPAZINE) 10 MG tablet, Take 1 tablet (10 mg total) by mouth every 6 (six) hours as needed for nausea or vomiting.   SUPER B COMPLEX/C PO, Take by mouth daily.   tamsulosin (FLOMAX) 0.4 MG CAPS capsule, Take 1 capsule Daily for for Prostate   tetrahydrozoline-zinc (VISINE-AC) 0.05-0.25 % ophthalmic solution, Place 1-2 drops into both eyes 3 (three) times daily as needed (for redness/irritation.).   triamcinolone ointment (KENALOG) 0.1 %, Apply 1 application  topically 2 (two) times daily.   Turmeric 500 MG TABS, Take by mouth. Takes 3 in the morning and 3 in the evening   Zinc 50 MG TABS, Take by mouth daily.  Problem list He has Hyperlipidemia; Cluster headache syndrome; Essential hypertension; Coronary atherosclerosis; COPD mixed type ([place]); GERD; Lumbago, Chronic; PUD; BPH/Prostatism; Vitamin D deficiency; Medication management; Rhinitis, nonallergic, chronic; Obesity (BMI 30.0-34.9); OSA and COPD overlap syndrome ([place]); Aortic atherosclerosis ([place]); Left renal mass s/p robotic partial nephrectomy 03/02/2018; Recurrent incisional hernia with incarceration s/p lap repair w mesh 03/02/2018; Chronic kidney disease (CKD), stage III (moderate); Renal cell carcinoma, left ([place]); Adenocarcinoma of left  lung, stage 4 ([place]); Goals of care, counseling/discussion; Encounter for antineoplastic chemotherapy; Encounter for antineoplastic immunotherapy; Port-A-Cath in place; Pulmonary embolism ([place]); Statin myopathy; and 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   Observations/Objective:  BP 116/66    Pulse 80    Temp (!) 97 F (36.1 C)    Resp 18    Ht _0  (1.702 m)    Wt 212 lb 6.4 oz (96.3 kg)    BMI 33.27 kg/m   HEENT - WNL. Neck - supple.  Chest - Clear equal BS. Cor - Nl HS. RRR w/o sig MGR. PP 1(+) ankle and 2 + pedal edem . MS- FROM w/o deformities.  Gait Nl. No limp.  Neuro -  Nl w/o focal abnormalities.  Assessment and Plan:  1. Essential hypertension  - BASIC METABOLIC PANEL WITH GFR  2. Edema of both lower legs due to peripheral venous insufficiency  - BASIC METABOLIC PANEL WITH GFR  3. Prerenal azotemia  - BASIC METABOLIC PANEL WITH GFR  4. Vitamin D deficiency   5. Stage 3b chronic kidney disease  - BASIC METABOLIC PANEL WITH GFR  6. Medication management  - BASIC METABoLIC PNEL WITH GFR  Follow Up Instructions:       I discussed the assessment and treatment plan with the patient. The patient was provided an opportunity to ask questions and all were answered. The patient agreed with the plan and demonstrated an understanding of the instructions.   Kirtland Bouchard, MD

## 2019-11-04 ENCOUNTER — Ambulatory Visit (INDEPENDENT_AMBULATORY_CARE_PROVIDER_SITE_OTHER): Payer: Medicare Other | Admitting: Internal Medicine

## 2019-11-04 ENCOUNTER — Other Ambulatory Visit: Payer: Self-pay

## 2019-11-04 ENCOUNTER — Encounter: Payer: Self-pay | Admitting: Internal Medicine

## 2019-11-04 VITALS — BP 116/66 | HR 80 | Temp 97.0°F | Resp 18 | Ht 67.0 in | Wt 212.4 lb

## 2019-11-04 DIAGNOSIS — I10 Essential (primary) hypertension: Secondary | ICD-10-CM

## 2019-11-04 DIAGNOSIS — I2510 Atherosclerotic heart disease of native coronary artery without angina pectoris: Secondary | ICD-10-CM | POA: Diagnosis not present

## 2019-11-04 DIAGNOSIS — N1832 Chronic kidney disease, stage 3b: Secondary | ICD-10-CM

## 2019-11-04 DIAGNOSIS — G720 Drug-induced myopathy: Secondary | ICD-10-CM | POA: Diagnosis not present

## 2019-11-04 DIAGNOSIS — G8929 Other chronic pain: Secondary | ICD-10-CM

## 2019-11-04 DIAGNOSIS — E559 Vitamin D deficiency, unspecified: Secondary | ICD-10-CM

## 2019-11-04 DIAGNOSIS — R609 Edema, unspecified: Secondary | ICD-10-CM | POA: Diagnosis not present

## 2019-11-04 DIAGNOSIS — Z79899 Other long term (current) drug therapy: Secondary | ICD-10-CM | POA: Diagnosis not present

## 2019-11-04 DIAGNOSIS — M25552 Pain in left hip: Secondary | ICD-10-CM

## 2019-11-04 DIAGNOSIS — I872 Venous insufficiency (chronic) (peripheral): Secondary | ICD-10-CM | POA: Diagnosis not present

## 2019-11-04 DIAGNOSIS — R7989 Other specified abnormal findings of blood chemistry: Secondary | ICD-10-CM

## 2019-11-05 DIAGNOSIS — M545 Low back pain: Secondary | ICD-10-CM | POA: Diagnosis not present

## 2019-11-05 DIAGNOSIS — C3492 Malignant neoplasm of unspecified part of left bronchus or lung: Secondary | ICD-10-CM | POA: Diagnosis not present

## 2019-11-05 LAB — BASIC METABOLIC PANEL WITH GFR
BUN/Creatinine Ratio: 23 (calc) — ABNORMAL HIGH (ref 6–22)
BUN: 40 mg/dL — ABNORMAL HIGH (ref 7–25)
CO2: 28 mmol/L (ref 20–32)
Calcium: 9.3 mg/dL (ref 8.6–10.3)
Chloride: 96 mmol/L — ABNORMAL LOW (ref 98–110)
Creat: 1.71 mg/dL — ABNORMAL HIGH (ref 0.70–1.18)
GFR, Est African American: 44 mL/min/{1.73_m2} — ABNORMAL LOW (ref >=60)
GFR, Est Non African American: 38 mL/min/{1.73_m2} — ABNORMAL LOW (ref >=60)
Glucose, Bld: 105 mg/dL — ABNORMAL HIGH (ref 65–99)
Potassium: 4.3 mmol/L (ref 3.5–5.3)
Sodium: 133 mmol/L — ABNORMAL LOW (ref 135–146)

## 2019-11-06 ENCOUNTER — Ambulatory Visit (INDEPENDENT_AMBULATORY_CARE_PROVIDER_SITE_OTHER): Payer: Medicare Other | Admitting: Cardiology

## 2019-11-06 ENCOUNTER — Other Ambulatory Visit: Payer: Self-pay

## 2019-11-06 ENCOUNTER — Encounter: Payer: Self-pay | Admitting: Cardiology

## 2019-11-06 VITALS — BP 122/76 | HR 91 | Ht 67.0 in | Wt 216.0 lb

## 2019-11-06 DIAGNOSIS — G720 Drug-induced myopathy: Secondary | ICD-10-CM | POA: Diagnosis not present

## 2019-11-06 DIAGNOSIS — I10 Essential (primary) hypertension: Secondary | ICD-10-CM | POA: Diagnosis not present

## 2019-11-06 DIAGNOSIS — E785 Hyperlipidemia, unspecified: Secondary | ICD-10-CM

## 2019-11-06 DIAGNOSIS — I2510 Atherosclerotic heart disease of native coronary artery without angina pectoris: Secondary | ICD-10-CM | POA: Diagnosis not present

## 2019-11-06 DIAGNOSIS — T466X5A Adverse effect of antihyperlipidemic and antiarteriosclerotic drugs, initial encounter: Secondary | ICD-10-CM

## 2019-11-06 DIAGNOSIS — R600 Localized edema: Secondary | ICD-10-CM

## 2019-11-06 NOTE — Patient Instructions (Signed)
Medication Instructions:   Your physician recommends that you continue on your current medications as directed. Please refer to the Current Medication list given to you today.  *If you need a refill on your cardiac medications before your next appointment, please call your pharmacy*   Testing/Procedures:  Your physician has requested that you have an echocardiogram. Echocardiography is a painless test that uses sound waves to create images of your heart. It provides your doctor with information about the size and shape of your heart and how well your heart's chambers and valves are working. This procedure takes approximately one hour. There are no restrictions for this procedure.   Follow-Up: At [facility], you and your health needs are our priority.  As part of our continuing mission to provide you with exceptional heart care, we have created designated Provider Care Teams.  These Care Teams include your primary Cardiologist (physician) and Advanced Practice Providers (APPs -  Physician Assistants and Nurse Practitioners) who all work together to provide you with the care you need, when you need it.  We recommend signing up for the patient portal called "MyChart".  Sign up information is provided on this After Visit Summary.  MyChart is used to connect with patients for Virtual Visits (Telemedicine).  Patients are able to view lab/test results, encounter notes, upcoming appointments, etc.  Non-urgent messages can be sent to your provider as well.   To learn more about what you can do with MyChart, go to [email or web address].    Your next appointment:   6 month(s)  The format for your next appointment:   In Person  Provider:   Ena Dawley, MD

## 2019-11-06 NOTE — Progress Notes (Signed)
Cardiology Office Note    Date:  11/06/2019   ID:  [name], [name] 1946/03/12, MRN [id number]  PCP:  [name], MD  Cardiologist: Ena Dawley, MD EPS: None  Chief complaint: Chest pain, lower extremity edema  History of Present Illness:  [name] is a 74 y.o. male with history of CAD with multiple stents in his LAD and RCA.  Last cath 12/2010 showed total occluded circumflex known from prior cast with collaterals from the RCA.  There was no other significant coronary disease EF 55%.  He is intolerant to statins.  Also has hypertension, HLD. Patient just underwent robotic partial nephrectomy for left renal mass and repair of incisional hernia 03/02/2018. He was also diagnosed with Stage IV Metastatic, Nonsquamous, lung cancer, currently on Chemotherapy/Immunotherapy.  Lipid profile 02/16/2018 cholesterol 215 triglycerides 579 and non-HDL cholesterol 180 LDL not calculated.  He was seen in the lipid clinic and recommended to start Vascepa however cost was prohibitive.  He is intolerant to statins.  07/03/2019 the patient is coming after 6 months, he has been overall doing well, he gets chemo and immunotherapy every 3 weeks and feels very tired and slightly short of breath for first 10 days but then he recovers and is able to do his usual activities and does not feel limited. He denies any chest pain, no palpitations no lower extremity edema orthopnea proximal nocturnal dyspnea.  11/06/2019 -the patient is coming after 4 months, he has symptoms of stable angina that is not related to exertion, he experiences every 2 to 3 weeks and one nitroglycerin helps to eliminate the symptoms.  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  He otherwise denies any  orthopnea or proximal nocturnal dyspnea no palpitations.  Past Medical History:  Diagnosis Date  . Anginal pain ([place])   . Anxiety    treated /w Xanax for mild depression , using 2 times per day, not PRN  . Asthma   . Benign prostatic hypertrophy   . CAD (coronary artery disease)    pt last left heart cath was in Nov 2008. EF was 55% on left ventriculogram. Circumflex was totally occluded after the 1st obtuse marginal with collaterals supplying the distal circumflex. LAD showed luminal irregularities. The stent in LAD was patent. The RCA showed luminal irregularities. The stent in th emid RCA and PDA were patent. There were no inverventions.   . Chest pain    from anxiety last time 1 month ago  . 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 Chronic obstructive pulmonary disease (HCC)    asthma  . Cluster headache    relative to sinus problems none recnt  . Constipation   . Depression   . DM2 (diabetes mellitus, type 2) ([place])    well with diet and exercise controlled  . GERD (gastroesophageal reflux disease)    hiatal hernia. Patient did have a Nissen fundoplication rare  . Heart murmur    heard 30 yrs ago  . History of blood transfusion    need for Bld. transfusion relative to taking NSAIDS  . HTN (hypertension)    pt. followed by [facility], last cardiac visit 2012, one yr. ago  . Hyperlipidemia   .  Cardiology Office Note    Date:  11/06/2019   ID:  [name], [name] 1946/03/12, MRN [id number]  PCP:  [name], MD  Cardiologist: Ena Dawley, MD EPS: None  Chief complaint: Chest pain, lower extremity edema  History of Present Illness:  [name] is a 74 y.o. male with history of CAD with multiple stents in his LAD and RCA.  Last cath 12/2010 showed total occluded circumflex known from prior cast with collaterals from the RCA.  There was no other significant coronary disease EF 55%.  He is intolerant to statins.  Also has hypertension, HLD. Patient just underwent robotic partial nephrectomy for left renal mass and repair of incisional hernia 03/02/2018. He was also diagnosed with Stage IV Metastatic, Nonsquamous, lung cancer, currently on Chemotherapy/Immunotherapy.  Lipid profile 02/16/2018 cholesterol 215 triglycerides 579 and non-HDL cholesterol 180 LDL not calculated.  He was seen in the lipid clinic and recommended to start Vascepa however cost was prohibitive.  He is intolerant to statins.  07/03/2019 the patient is coming after 6 months, he has been overall doing well, he gets chemo and immunotherapy every 3 weeks and feels very tired and slightly short of breath for first 10 days but then he recovers and is able to do his usual activities and does not feel limited. He denies any chest pain, no palpitations no lower extremity edema orthopnea proximal nocturnal dyspnea.  11/06/2019 -the patient is coming after 4 months, he has symptoms of stable angina that is not related to exertion, he experiences every 2 to 3 weeks and one nitroglycerin helps to eliminate the symptoms.  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  He otherwise denies any  orthopnea or proximal nocturnal dyspnea no palpitations.  Past Medical History:  Diagnosis Date  . Anginal pain ([place])   . Anxiety    treated /w Xanax for mild depression , using 2 times per day, not PRN  . Asthma   . Benign prostatic hypertrophy   . CAD (coronary artery disease)    pt last left heart cath was in Nov 2008. EF was 55% on left ventriculogram. Circumflex was totally occluded after the 1st obtuse marginal with collaterals supplying the distal circumflex. LAD showed luminal irregularities. The stent in LAD was patent. The RCA showed luminal irregularities. The stent in th emid RCA and PDA were patent. There were no inverventions.   . Chest pain    from anxiety last time 1 month ago  . 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 Chronic obstructive pulmonary disease (HCC)    asthma  . Cluster headache    relative to sinus problems none recnt  . Constipation   . Depression   . DM2 (diabetes mellitus, type 2) ([place])    well with diet and exercise controlled  . GERD (gastroesophageal reflux disease)    hiatal hernia. Patient did have a Nissen fundoplication rare  . Heart murmur    heard 30 yrs ago  . History of blood transfusion    need for Bld. transfusion relative to taking NSAIDS  . HTN (hypertension)    pt. followed by [facility], last cardiac visit 2012, one yr. ago  . Hyperlipidemia   .  Cardiology Office Note    Date:  11/06/2019   ID:  [name], [name] 1946/03/12, MRN [id number]  PCP:  [name], MD  Cardiologist: Ena Dawley, MD EPS: None  Chief complaint: Chest pain, lower extremity edema  History of Present Illness:  [name] is a 74 y.o. male with history of CAD with multiple stents in his LAD and RCA.  Last cath 12/2010 showed total occluded circumflex known from prior cast with collaterals from the RCA.  There was no other significant coronary disease EF 55%.  He is intolerant to statins.  Also has hypertension, HLD. Patient just underwent robotic partial nephrectomy for left renal mass and repair of incisional hernia 03/02/2018. He was also diagnosed with Stage IV Metastatic, Nonsquamous, lung cancer, currently on Chemotherapy/Immunotherapy.  Lipid profile 02/16/2018 cholesterol 215 triglycerides 579 and non-HDL cholesterol 180 LDL not calculated.  He was seen in the lipid clinic and recommended to start Vascepa however cost was prohibitive.  He is intolerant to statins.  07/03/2019 the patient is coming after 6 months, he has been overall doing well, he gets chemo and immunotherapy every 3 weeks and feels very tired and slightly short of breath for first 10 days but then he recovers and is able to do his usual activities and does not feel limited. He denies any chest pain, no palpitations no lower extremity edema orthopnea proximal nocturnal dyspnea.  11/06/2019 -the patient is coming after 4 months, he has symptoms of stable angina that is not related to exertion, he experiences every 2 to 3 weeks and one nitroglycerin helps to eliminate the symptoms.  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  He otherwise denies any  orthopnea or proximal nocturnal dyspnea no palpitations.  Past Medical History:  Diagnosis Date  . Anginal pain ([place])   . Anxiety    treated /w Xanax for mild depression , using 2 times per day, not PRN  . Asthma   . Benign prostatic hypertrophy   . CAD (coronary artery disease)    pt last left heart cath was in Nov 2008. EF was 55% on left ventriculogram. Circumflex was totally occluded after the 1st obtuse marginal with collaterals supplying the distal circumflex. LAD showed luminal irregularities. The stent in LAD was patent. The RCA showed luminal irregularities. The stent in th emid RCA and PDA were patent. There were no inverventions.   . Chest pain    from anxiety last time 1 month ago  . 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 Chronic obstructive pulmonary disease (HCC)    asthma  . Cluster headache    relative to sinus problems none recnt  . Constipation   . Depression   . DM2 (diabetes mellitus, type 2) ([place])    well with diet and exercise controlled  . GERD (gastroesophageal reflux disease)    hiatal hernia. Patient did have a Nissen fundoplication rare  . Heart murmur    heard 30 yrs ago  . History of blood transfusion    need for Bld. transfusion relative to taking NSAIDS  . HTN (hypertension)    pt. followed by [facility], last cardiac visit 2012, one yr. ago  . Hyperlipidemia   .  Cardiology Office Note    Date:  11/06/2019   ID:  [name], [name] 1946/03/12, MRN [id number]  PCP:  [name], MD  Cardiologist: Ena Dawley, MD EPS: None  Chief complaint: Chest pain, lower extremity edema  History of Present Illness:  [name] is a 74 y.o. male with history of CAD with multiple stents in his LAD and RCA.  Last cath 12/2010 showed total occluded circumflex known from prior cast with collaterals from the RCA.  There was no other significant coronary disease EF 55%.  He is intolerant to statins.  Also has hypertension, HLD. Patient just underwent robotic partial nephrectomy for left renal mass and repair of incisional hernia 03/02/2018. He was also diagnosed with Stage IV Metastatic, Nonsquamous, lung cancer, currently on Chemotherapy/Immunotherapy.  Lipid profile 02/16/2018 cholesterol 215 triglycerides 579 and non-HDL cholesterol 180 LDL not calculated.  He was seen in the lipid clinic and recommended to start Vascepa however cost was prohibitive.  He is intolerant to statins.  07/03/2019 the patient is coming after 6 months, he has been overall doing well, he gets chemo and immunotherapy every 3 weeks and feels very tired and slightly short of breath for first 10 days but then he recovers and is able to do his usual activities and does not feel limited. He denies any chest pain, no palpitations no lower extremity edema orthopnea proximal nocturnal dyspnea.  11/06/2019 -the patient is coming after 4 months, he has symptoms of stable angina that is not related to exertion, he experiences every 2 to 3 weeks and one nitroglycerin helps to eliminate the symptoms.  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  He otherwise denies any  orthopnea or proximal nocturnal dyspnea no palpitations.  Past Medical History:  Diagnosis Date  . Anginal pain ([place])   . Anxiety    treated /w Xanax for mild depression , using 2 times per day, not PRN  . Asthma   . Benign prostatic hypertrophy   . CAD (coronary artery disease)    pt last left heart cath was in Nov 2008. EF was 55% on left ventriculogram. Circumflex was totally occluded after the 1st obtuse marginal with collaterals supplying the distal circumflex. LAD showed luminal irregularities. The stent in LAD was patent. The RCA showed luminal irregularities. The stent in th emid RCA and PDA were patent. There were no inverventions.   . Chest pain    from anxiety last time 1 month ago  . 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 Chronic obstructive pulmonary disease (HCC)    asthma  . Cluster headache    relative to sinus problems none recnt  . Constipation   . Depression   . DM2 (diabetes mellitus, type 2) ([place])    well with diet and exercise controlled  . GERD (gastroesophageal reflux disease)    hiatal hernia. Patient did have a Nissen fundoplication rare  . Heart murmur    heard 30 yrs ago  . History of blood transfusion    need for Bld. transfusion relative to taking NSAIDS  . HTN (hypertension)    pt. followed by [facility], last cardiac visit 2012, one yr. ago  . Hyperlipidemia   .  Cardiology Office Note    Date:  11/06/2019   ID:  [name], [name] 1946/03/12, MRN [id number]  PCP:  [name], MD  Cardiologist: Ena Dawley, MD EPS: None  Chief complaint: Chest pain, lower extremity edema  History of Present Illness:  [name] is a 74 y.o. male with history of CAD with multiple stents in his LAD and RCA.  Last cath 12/2010 showed total occluded circumflex known from prior cast with collaterals from the RCA.  There was no other significant coronary disease EF 55%.  He is intolerant to statins.  Also has hypertension, HLD. Patient just underwent robotic partial nephrectomy for left renal mass and repair of incisional hernia 03/02/2018. He was also diagnosed with Stage IV Metastatic, Nonsquamous, lung cancer, currently on Chemotherapy/Immunotherapy.  Lipid profile 02/16/2018 cholesterol 215 triglycerides 579 and non-HDL cholesterol 180 LDL not calculated.  He was seen in the lipid clinic and recommended to start Vascepa however cost was prohibitive.  He is intolerant to statins.  07/03/2019 the patient is coming after 6 months, he has been overall doing well, he gets chemo and immunotherapy every 3 weeks and feels very tired and slightly short of breath for first 10 days but then he recovers and is able to do his usual activities and does not feel limited. He denies any chest pain, no palpitations no lower extremity edema orthopnea proximal nocturnal dyspnea.  11/06/2019 -the patient is coming after 4 months, he has symptoms of stable angina that is not related to exertion, he experiences every 2 to 3 weeks and one nitroglycerin helps to eliminate the symptoms.  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  He otherwise denies any  orthopnea or proximal nocturnal dyspnea no palpitations.  Past Medical History:  Diagnosis Date  . Anginal pain ([place])   . Anxiety    treated /w Xanax for mild depression , using 2 times per day, not PRN  . Asthma   . Benign prostatic hypertrophy   . CAD (coronary artery disease)    pt last left heart cath was in Nov 2008. EF was 55% on left ventriculogram. Circumflex was totally occluded after the 1st obtuse marginal with collaterals supplying the distal circumflex. LAD showed luminal irregularities. The stent in LAD was patent. The RCA showed luminal irregularities. The stent in th emid RCA and PDA were patent. There were no inverventions.   . Chest pain    from anxiety last time 1 month ago  . 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 Chronic obstructive pulmonary disease (HCC)    asthma  . Cluster headache    relative to sinus problems none recnt  . Constipation   . Depression   . DM2 (diabetes mellitus, type 2) ([place])    well with diet and exercise controlled  . GERD (gastroesophageal reflux disease)    hiatal hernia. Patient did have a Nissen fundoplication rare  . Heart murmur    heard 30 yrs ago  . History of blood transfusion    need for Bld. transfusion relative to taking NSAIDS  . HTN (hypertension)    pt. followed by [facility], last cardiac visit 2012, one yr. ago  . Hyperlipidemia   .  Cardiology Office Note    Date:  11/06/2019   ID:  [name], [name] 1946/03/12, MRN [id number]  PCP:  [name], MD  Cardiologist: Ena Dawley, MD EPS: None  Chief complaint: Chest pain, lower extremity edema  History of Present Illness:  [name] is a 74 y.o. male with history of CAD with multiple stents in his LAD and RCA.  Last cath 12/2010 showed total occluded circumflex known from prior cast with collaterals from the RCA.  There was no other significant coronary disease EF 55%.  He is intolerant to statins.  Also has hypertension, HLD. Patient just underwent robotic partial nephrectomy for left renal mass and repair of incisional hernia 03/02/2018. He was also diagnosed with Stage IV Metastatic, Nonsquamous, lung cancer, currently on Chemotherapy/Immunotherapy.  Lipid profile 02/16/2018 cholesterol 215 triglycerides 579 and non-HDL cholesterol 180 LDL not calculated.  He was seen in the lipid clinic and recommended to start Vascepa however cost was prohibitive.  He is intolerant to statins.  07/03/2019 the patient is coming after 6 months, he has been overall doing well, he gets chemo and immunotherapy every 3 weeks and feels very tired and slightly short of breath for first 10 days but then he recovers and is able to do his usual activities and does not feel limited. He denies any chest pain, no palpitations no lower extremity edema orthopnea proximal nocturnal dyspnea.  11/06/2019 -the patient is coming after 4 months, he has symptoms of stable angina that is not related to exertion, he experiences every 2 to 3 weeks and one nitroglycerin helps to eliminate the symptoms.  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  He otherwise denies any  orthopnea or proximal nocturnal dyspnea no palpitations.  Past Medical History:  Diagnosis Date  . Anginal pain ([place])   . Anxiety    treated /w Xanax for mild depression , using 2 times per day, not PRN  . Asthma   . Benign prostatic hypertrophy   . CAD (coronary artery disease)    pt last left heart cath was in Nov 2008. EF was 55% on left ventriculogram. Circumflex was totally occluded after the 1st obtuse marginal with collaterals supplying the distal circumflex. LAD showed luminal irregularities. The stent in LAD was patent. The RCA showed luminal irregularities. The stent in th emid RCA and PDA were patent. There were no inverventions.   . Chest pain    from anxiety last time 1 month ago  . 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 Chronic obstructive pulmonary disease (HCC)    asthma  . Cluster headache    relative to sinus problems none recnt  . Constipation   . Depression   . DM2 (diabetes mellitus, type 2) ([place])    well with diet and exercise controlled  . GERD (gastroesophageal reflux disease)    hiatal hernia. Patient did have a Nissen fundoplication rare  . Heart murmur    heard 30 yrs ago  . History of blood transfusion    need for Bld. transfusion relative to taking NSAIDS  . HTN (hypertension)    pt. followed by [facility], last cardiac visit 2012, one yr. ago  . Hyperlipidemia   .

## 2019-11-14 DIAGNOSIS — M545 Low back pain: Secondary | ICD-10-CM | POA: Diagnosis not present

## 2019-11-15 NOTE — Progress Notes (Signed)
Pharmacist Chemotherapy Monitoring - Follow Up Assessment    I verify that I have reviewed each item in the below checklist:  . Regimen for the patient is scheduled for the appropriate day and plan matches scheduled date. Marland Kitchen Appropriate non-routine labs are ordered dependent on drug ordered. . If applicable, additional medications reviewed and ordered per protocol based on lifetime cumulative doses and/or treatment regimen.   Plan for follow-up and/or issues identified: No . I-vent associated with next due treatment: No . MD and/or nursing notified: No  Andree Heeg K 11/15/2019 10:40 AM

## 2019-11-19 ENCOUNTER — Telehealth: Payer: Self-pay | Admitting: Medical Oncology

## 2019-11-19 DIAGNOSIS — C7951 Secondary malignant neoplasm of bone: Secondary | ICD-10-CM | POA: Diagnosis not present

## 2019-11-19 DIAGNOSIS — M5136 Other intervertebral disc degeneration, lumbar region: Secondary | ICD-10-CM | POA: Diagnosis not present

## 2019-11-19 DIAGNOSIS — I10 Essential (primary) hypertension: Secondary | ICD-10-CM | POA: Diagnosis not present

## 2019-11-19 DIAGNOSIS — R7303 Prediabetes: Secondary | ICD-10-CM | POA: Diagnosis not present

## 2019-11-19 DIAGNOSIS — C3412 Malignant neoplasm of upper lobe, left bronchus or lung: Secondary | ICD-10-CM | POA: Diagnosis not present

## 2019-11-19 DIAGNOSIS — I2510 Atherosclerotic heart disease of native coronary artery without angina pectoris: Secondary | ICD-10-CM | POA: Diagnosis not present

## 2019-11-19 DIAGNOSIS — E785 Hyperlipidemia, unspecified: Secondary | ICD-10-CM | POA: Diagnosis not present

## 2019-11-19 NOTE — Telephone Encounter (Signed)
LVM to see if we need to cancel his appts this week.

## 2019-11-21 ENCOUNTER — Ambulatory Visit: Payer: Medicare Other | Admitting: Internal Medicine

## 2019-11-21 ENCOUNTER — Ambulatory Visit: Payer: Medicare Other

## 2019-11-21 ENCOUNTER — Other Ambulatory Visit: Payer: Medicare Other

## 2019-11-21 DIAGNOSIS — C7951 Secondary malignant neoplasm of bone: Secondary | ICD-10-CM | POA: Diagnosis not present

## 2019-11-21 DIAGNOSIS — C3412 Malignant neoplasm of upper lobe, left bronchus or lung: Secondary | ICD-10-CM | POA: Diagnosis not present

## 2019-11-21 NOTE — Progress Notes (Signed)
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  predniSONE (DELTASONE) 5 MG tablet,  .  furosemide (LASIX) 80 MG tablet, Take 80 mg by mouth daily.NITROSTAT 0.4 MG if needed for Chest Pain .  LOVAZA 1 g capsule, Take 2 capsules 2 times daily. Marland Kitchen  albuterol  HFA 108 inhaler, Inhale 2 puffs into the lungs every 4 \\ hours as needed for wheezing or shortness of breath. .  budesonide-formoterol (SYMBICORT) 160-4.5 MCG/ACT inhaler, Use twice daily, wash your mouth afterwards to avoid yeast. .  DOXYLAMINE SUCCINATE PO, Take 0.5 tablets by mouth at bedtime.  Marland Kitchen  ipratropium (ATROVENT) 0.03 % nasal spray, Place 1-2 sprays into both nostrils daily as needed (for allergies.). Marland Kitchen  triamcinolone (NASACORT) 55 MCG/ACT AERO nasal inhaler, Place 2 sprays into the nose at bedtime. Marland Kitchen  acetaminophen (TYLENOL) 500 MG tablet, Take 1 tablet (500 mg total) by mouth every 6 (six) hours as needed. .  meloxicam (MOBIC) 15 MG tablet, Take 1/2 to 1 tablet Daily for Pain & Inflammation and try limit to 4 to 5 days /week to Avoid Kidney Damage .  folic acid (FOLVITE) 1 MG tablet, Take 1 tablet (1 mg total) by mouth daily. Alveda Reasons 20 MG TABS tablet, TAKE 1 TABLET BY MOUTH EVERY DAY WITH SUPPER .  Alpha-Lipoic Acid 200 MG CAPS, Take by mouth 2 (two) times daily. .  Ascorbic Acid (VITAMIN C) 1000 MG tablet, Take 1,000 mg by mouth 2 (two) times daily. .  Blood Glucose Monitoring Suppl (FREESTYLE FREEDOM LITE) w/Device KIT, Check blood sugar 1 time a day. [id number] .  Cholecalciferol (VITAMIN D3) 5000 units CAPS, Take 5,000 Units by mouth See admin instructions. Take 10 caps weekly  .  CHOLINE PO, Take 600 mg by mouth daily. Marland Kitchen  CINNAMON PO, Take by mouth 3 (three) times daily.  .  diazepam (VALIUM) 5 MG tablet, Take 1/2 to 1 tablet 2 to 3 x  /day as needed for Anxiety .  lidocaine-prilocaine (EMLA) cream, Apply 1 application topically as needed. .  magnesium oxide (MAG-OX) 400 MG tablet, Take 1,200 mg by mouth daily.  Marland Kitchen  MELATONIN PO, Take 30 mg by mouth at bedtime. .  Multiple Vitamins-Minerals (MULTIVITAMIN ADULT PO), Take by mouth daily. Marland Kitchen  OVER THE COUNTER MEDICATION, Taking OTC Iron 1 tablet daily. Marland Kitchen  OVER THE COUNTER MEDICATION, OTC Nasacrom NS 1 spray each nostril 1 to 2 times daily. .  prochlorperazine (COMPAZINE) 10 MG tablet, Take 1 tablet (10 mg total) by mouth every 6 (six) hours as needed for nausea or vomiting. .  SUPER B COMPLEX/C PO, Take by mouth daily. .  tamsulosin (FLOMAX) 0.4 MG CAPS capsule, Take 1 capsule Daily for for Prostate .  tetrahydrozoline-zinc (VISINE-AC) 0.05-0.25 % ophthalmic solution, Place 1-2 drops into both eyes 3 (three) times daily as needed (for redness/irritation.). Marland Kitchen  triamcinolone ointment (KENALOG) 0.1 %, Apply 1 application topically 2 (two) times daily. .  Turmeric 500 MG TABS, Take by mouth. Takes 3 in the morning and 3 in the evening .  Zinc 50 MG TABS, Take by mouth daily.  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   Observations/Objective:  BP 140/78   Pulse 92   Temp (!) 97.4 F (36.3 C)   Resp 18   Ht _0  (1.702 m)   Wt 213 lb 3.2 oz (96.7 kg)   BMI 33.39 kg/m   HEENT - WNL. Neck - supple.  Chest - Clear equal BS. Cor - Nl HS. RRR w/o sig MGR. PP 1(+). No edema. GU - uncircumcised. 3-4 mm superficial ulcerated lesions of the glans penis.  MS- FROM w/o deformities.  Gait Nl. Neuro -  Nl w/o focal abnormalities. Skin - typical rash in groin suspect ringworm.  Assessment and Plan:  1. Balanitis, ? HSV II  - valACYclovir  1000 MG tablet; Take 1 tablet   3 x  /day for Viral Blistering Rash  Disp: 42 tablet  - LOTRISONE cream; Apply to blistering Rash & Groin Rash 3 x / day  Dispense: 45 g; Refill: 3  - dexamethasone (DECADRON) 4 MG tablet; Take 1 tab 3 x day - 3 days, then 2 x day - 3 days, then 1 tab daily  Dispense: 20 tablet; Refill: 0  2. Tinea cruris  - clotrimazole-betamethasone (LOTRISONE) cream; Apply to blistering Rash & Groin Rash 3 x / day  Dispense: 45 g; Refill: 3  - terbinafine (LAMISIL) 250 MG tablet; Take 1 tablet 3 x /day for Ring/ Skin Fungus infection  Dispense: 42 tablet; Refill: 0  3. Stage 3b chronic kidney disease  - COMPLETE METABOLIC PANEL WITH GFR  4. Anemia, unspecified type  - CBC with Differential/Platelet - Iron,Total/Total Iron Binding Cap - Ferritin - Reticulocytes  5. Medication management  - valACYclovir (VALTREX) 1000 MG tablet; Take 1 tablet   3 x  /day for Viral Blistering Rash  Dispense: 42 tablet; Refill: 0 - clotrimazole-betamethasone (LOTRISONE) cream; Apply to blistering Rash & Groin Rash 3 x / day  Dispense: 45 g; Refill: 3 - terbinafine (LAMISIL) 250 MG tablet; Take 1 tablet 3 x /day for Ring/ Skin Fungus infection  Dispense: 42 tablet; Refill: 0 - dexamethasone (DECADRON) 4 MG tablet; Take 1 tab 3 x day - 3 days, then 2 x day - 3 days, then 1 tab daily  Dispense: 20  tablet; Refill: 0  Follow Up Instructions:      I discussed the assessment and treatment plan with the patient. The patient was provided an opportunity to ask questions and all were answered. The patient agreed with the plan and demonstrated an understanding of the instructions.      The patient was advised to call back or seek an in-person evaluation if the symptoms worsen or if the condition fails to improve as anticipated.   Kirtland Bouchard, MD

## 2019-11-22 ENCOUNTER — Other Ambulatory Visit: Payer: Self-pay

## 2019-11-22 ENCOUNTER — Ambulatory Visit (INDEPENDENT_AMBULATORY_CARE_PROVIDER_SITE_OTHER): Payer: Medicare Other | Admitting: Internal Medicine

## 2019-11-22 ENCOUNTER — Encounter: Payer: Self-pay | Admitting: Internal Medicine

## 2019-11-22 ENCOUNTER — Other Ambulatory Visit: Payer: Self-pay | Admitting: Internal Medicine

## 2019-11-22 VITALS — BP 140/78 | HR 92 | Temp 97.4°F | Resp 18 | Ht 67.0 in | Wt 213.2 lb

## 2019-11-22 DIAGNOSIS — D649 Anemia, unspecified: Secondary | ICD-10-CM | POA: Diagnosis not present

## 2019-11-22 DIAGNOSIS — Z79899 Other long term (current) drug therapy: Secondary | ICD-10-CM | POA: Diagnosis not present

## 2019-11-22 DIAGNOSIS — N1832 Chronic kidney disease, stage 3b: Secondary | ICD-10-CM

## 2019-11-22 DIAGNOSIS — N481 Balanitis: Secondary | ICD-10-CM

## 2019-11-22 DIAGNOSIS — B356 Tinea cruris: Secondary | ICD-10-CM | POA: Diagnosis not present

## 2019-11-22 DIAGNOSIS — M6281 Muscle weakness (generalized): Secondary | ICD-10-CM | POA: Diagnosis not present

## 2019-11-22 DIAGNOSIS — I2510 Atherosclerotic heart disease of native coronary artery without angina pectoris: Secondary | ICD-10-CM

## 2019-11-22 DIAGNOSIS — R262 Difficulty in walking, not elsewhere classified: Secondary | ICD-10-CM | POA: Diagnosis not present

## 2019-11-22 MED ORDER — TERBINAFINE HCL 250 MG PO TABS: ORAL_TABLET | ORAL | 0 refills | Status: DC

## 2019-11-22 MED ORDER — CLOTRIMAZOLE-BETAMETHASONE 1-0.05 % EX CREA: TOPICAL_CREAM | CUTANEOUS | 3 refills | Status: AC

## 2019-11-22 MED ORDER — VALACYCLOVIR HCL 1 G PO TABS: ORAL_TABLET | ORAL | 0 refills | Status: DC

## 2019-11-22 MED ORDER — DEXAMETHASONE 4 MG PO TABS: ORAL_TABLET | ORAL | 0 refills | Status: DC

## 2019-11-22 NOTE — Patient Instructions (Signed)
Balanitis  Balanitis is swelling and irritation (inflammation) of the head of the penis (glans penis). The condition may also cause inflammation of the skin around the glans penis (foreskin) in men who have not been circumcised. It may develop because of an infection or another medical condition. Balanitis occurs most often among men who have not had their foreskin removed (uncircumcised men). Balanitis sometimes causes scarring of the penis or foreskin, which can require surgery. Untreated balanitis can increase the risk of penile cancer. What are the causes? Common causes of this condition include:  Poor personal hygiene, especially in uncircumcised men. Not cleaning the glans penis and foreskin well can result in buildup of bacteria, viruses, and yeast, which can lead to infection and inflammation.  Irritation and lack of air flow due to fluid (smegma) that can build up on the glans penis. Other causes include:  Chemical irritation from products such as soaps or shower gels (especially those that have fragrance), condoms, personal lubricants, petroleum jelly, spermicides, or fabric softeners.  Skin conditions, such as eczema, dermatitis, and psoriasis.  Allergies to medicines, such as tetracycline and sulfa drugs.  Certain medical conditions, including liver cirrhosis, congestive heart failure, diabetes, and kidney disease.  Infections, such as candidiasis, HPV (human papillomavirus), herpes simplex, gonorrhea, and syphilis.  Severe obesity. What increases the risk? The following factors may make you more likely to develop this condition:  Having diabetes. This is the most common risk factor.  Having a tight foreskin that is difficult to pull back (retract) past the glans.  Having sexual intercourse without using a condom. What are the signs or symptoms? Symptoms of this condition include:  Discharge from under the foreskin.  A bad smell.  Pain or difficulty retracting the  foreskin.  Tenderness, redness, and swelling of the glans.  A rash or sores on the glans or foreskin.  Itchiness.  Inability to get an erection due to pain.  Difficulty urinating.  Scarring of the penis or foreskin, in some cases. How is this diagnosed? This condition may be diagnosed based on:  A physical exam.  Testing a swab of discharge to check for bacterial or fungal infection.  Blood tests: ? To check for viruses that can cause balanitis. ? To check your blood sugar (glucose) level. High blood glucose could be a sign of diabetes, which can cause balanitis. How is this treated? Treatment for balanitis depends on the cause. Treatment may include:  Improving personal hygiene. Your health care provider may recommend sitting in a bath of warm water that is deep enough to cover your hips and buttocks (sitz bath).  Medicines such as: ? Creams or ointments to reduce swelling (steroids) or to treat an infection. ? Antibiotic medicine. ? Antifungal medicine.  Surgery to remove or cut the foreskin (circumcision). This may be done if you have scarring on the foreskin that makes it difficult to retract.  Controlling other medical problems that may be causing your condition or making it worse. Follow these instructions at home:  Do not have sex until the condition clears up, or until your health care provider approves.  Keep your penis clean and dry. Take sitz baths as recommended by your health care provider.  Avoid products that irritate your skin or make symptoms worse, such as soaps and shower gels that have fragrance.  Take over-the-counter and prescription medicines only as told by your health care provider. ? If you were prescribed an antibiotic medicine or a cream or ointment, use it as  told by your health care provider. Do not stop using your medicine, cream, or ointment even if you start to feel better. ? Do not drive or use heavy machinery while taking prescription  pain medicine. Contact a health care provider if:  Your symptoms get worse or do not improve with home care.  You develop chills or a fever.  You have trouble urinating.  You cannot retract your foreskin. Get help right away if:  You develop severe pain.  You are unable to urinate. Summary  Balanitis is inflammation of the head of the penis (glans penis) caused by irritation or infection.  Balanitis causes pain, redness, and swelling of the glans penis.  This condition is most common among uncircumcised men who do not keep their glans penis clean and in men who have diabetes.  Treatment may include creams or ointments.  Good hygiene is important for prevention. This includes pulling back the foreskin when washing your penis.  ======================================================    Jock Itch Jock itch (tinea cruris) is an infection of the skin in the groin area. It is caused by a fungus, which is a type of germ that lives in dark, damp places. Jock itch causes an itchy rash in the groin and upper thigh area. It usually goes away in 2-3 weeks with treatment. What are the causes? The fungus that causes jock itch may be spread by:  Touching a fungus infection elsewhere on your body, such as athlete's foot, and then touching your groin area.  Sharing towels or clothing, such as socks or shoes, with someone who has a fungal infection. What increases the risk? Jock itch is most common in men and adolescent boys. You are also more likely to develop the condition if you:  Are in a hot, humid climate.  Wear tight-fitting clothing or wet bathing suits for long periods of time.  Play sports.  Are overweight.  Have diabetes.  Have a weakened immune system.  Sweat a lot. What are the signs or symptoms? Symptoms of jock itch may include:  A red, pink, or brown rash in the groin area. Blisters may be present. The rash may spread to the thighs, anus, and buttocks.  Dry  and scaly skin on or around the rash.  Itchiness. How is this diagnosed? In most cases, your health care provider can make the diagnosis by looking at your rash. In some cases, a sample of infected skin may be scraped off. This sample may be examined under a microscope (biopsy) or by trying to grow the fungus from the sample (culture). How is this treated? Treatment for this condition may include:  Antifungal medicine to kill the fungus. This may be a skin cream, ointment, or powder, or it may be a medicine that you take by mouth.  Skin cream or ointment to reduce itching.  Lifestyle changes, such as wearing looser clothing and caring for your skin. Follow these instructions at home: Skin care  Apply skin creams, ointments, or powders exactly as told by your health care provider.  Wear loose-fitting clothing that does not rub against your groin area. Men should wear boxer shorts or loose-fitting underwear.  Keep your groin area clean and dry. ? Change your underwear every day. ? Change out of wet bathing suits as soon as possible. ? After bathing, use a separate towel to dry your groin area thoroughly and gently. Using a separate towel will help prevent spreading the infection to other areas of your body.  Avoid hot  baths and showers. Hot water can make itching worse.  Do not scratch the affected area. General instructions  Take and apply over-the-counter and prescription medicines only as told by your health care provider.  Do not share towels, clothing, or personal items with other people.  Wash your hands often with soap and water, especially after touching your groin area. If soap and water are not available, use alcohol-based hand sanitizer. Contact a health care provider if:  Your rash: ? Gets worse or does not get better after 2 weeks of treatment. ? Spreads. ? Returns after treatment is finished.  You have any of the following: ? A fever. ? New or worsening  redness, swelling, or pain around your rash. ? Fluid, blood, or pus coming from your rash. Summary  Jock itch (tinea cruris) is a fungal infection of the skin in the groin area.  The fungus can be spread by sharing clothing or by touching a fungus infection elsewhere on your body and then touching your groin area.  Treatment may include antifungal medicine and lifestyle changes, such as keeping the area clean and dry.

## 2019-11-23 LAB — COMPLETE METABOLIC PANEL WITH GFR
AG Ratio: 1.1 (calc) (ref 1.0–2.5)
ALT: 17 U/L (ref 9–46)
AST: 20 U/L (ref 10–35)
Albumin: 3.5 g/dL — ABNORMAL LOW (ref 3.6–5.1)
Alkaline phosphatase (APISO): 171 U/L — ABNORMAL HIGH (ref 35–144)
BUN/Creatinine Ratio: 24 (calc) — ABNORMAL HIGH (ref 6–22)
BUN: 32 mg/dL — ABNORMAL HIGH (ref 7–25)
CO2: 24 mmol/L (ref 20–32)
Calcium: 10 mg/dL (ref 8.6–10.3)
Chloride: 101 mmol/L (ref 98–110)
Creat: 1.32 mg/dL — ABNORMAL HIGH (ref 0.70–1.18)
GFR, Est African American: 60 mL/min/{1.73_m2} (ref >=60)
GFR, Est Non African American: 52 mL/min/{1.73_m2} — ABNORMAL LOW (ref >=60)
Globulin: 3.1 g/dL (calc) (ref 1.9–3.7)
Glucose, Bld: 117 mg/dL — ABNORMAL HIGH (ref 65–99)
Potassium: 4.4 mmol/L (ref 3.5–5.3)
Sodium: 134 mmol/L — ABNORMAL LOW (ref 135–146)
Total Bilirubin: 0.4 mg/dL (ref 0.2–1.2)
Total Protein: 6.6 g/dL (ref 6.1–8.1)

## 2019-11-23 LAB — CBC WITH DIFFERENTIAL/PLATELET
Absolute Monocytes: 499 cells/uL (ref 200–950)
Basophils Absolute: 31 cells/uL (ref 0–200)
Basophils Relative: 0.4 %
Eosinophils Absolute: 211 cells/uL (ref 15–500)
Eosinophils Relative: 2.7 %
HCT: 38.9 % (ref 38.5–50.0)
Hemoglobin: 13.3 g/dL (ref 13.2–17.1)
Lymphs Abs: 850 cells/uL (ref 850–3900)
MCH: 32.4 pg (ref 27.0–33.0)
MCHC: 34.2 g/dL (ref 32.0–36.0)
MCV: 94.6 fL (ref 80.0–100.0)
MPV: 10 fL (ref 7.5–12.5)
Monocytes Relative: 6.4 %
Neutro Abs: 6209 cells/uL (ref 1500–7800)
Neutrophils Relative %: 79.6 %
Platelets: 141 10*3/uL (ref 140–400)
RBC: 4.11 10*6/uL — ABNORMAL LOW (ref 4.20–5.80)
RDW: 13.7 % (ref 11.0–15.0)
Total Lymphocyte: 10.9 %
WBC: 7.8 10*3/uL (ref 3.8–10.8)

## 2019-11-23 LAB — FERRITIN: Ferritin: 147 ng/mL (ref 24–380)

## 2019-11-23 LAB — RETICULOCYTES
ABS Retic: 106860 cells/uL — ABNORMAL HIGH (ref 25000–9000)
Retic Ct Pct: 2.6 %

## 2019-11-23 LAB — IRON, TOTAL/TOTAL IRON BINDING CAP
%SAT: 19 % (calc) — ABNORMAL LOW (ref 20–48)
Iron: 47 ug/dL — ABNORMAL LOW (ref 50–180)
TIBC: 248 mcg/dL (calc) — ABNORMAL LOW (ref 250–425)

## 2019-11-25 ENCOUNTER — Other Ambulatory Visit: Payer: Self-pay | Admitting: *Deleted

## 2019-11-25 MED ORDER — TERBINAFINE HCL 250 MG PO TABS
250.0000 mg | ORAL_TABLET | Freq: Every day | ORAL | 0 refills | Status: DC
Start: 2019-11-25 — End: 2019-12-10

## 2019-11-27 DIAGNOSIS — M6281 Muscle weakness (generalized): Secondary | ICD-10-CM | POA: Diagnosis not present

## 2019-11-27 DIAGNOSIS — C7951 Secondary malignant neoplasm of bone: Secondary | ICD-10-CM | POA: Diagnosis not present

## 2019-11-27 DIAGNOSIS — R262 Difficulty in walking, not elsewhere classified: Secondary | ICD-10-CM | POA: Diagnosis not present

## 2019-11-27 DIAGNOSIS — C3492 Malignant neoplasm of unspecified part of left bronchus or lung: Secondary | ICD-10-CM | POA: Diagnosis not present

## 2019-11-28 ENCOUNTER — Other Ambulatory Visit: Payer: Self-pay

## 2019-11-28 ENCOUNTER — Ambulatory Visit (HOSPITAL_COMMUNITY): Payer: Medicare Other | Attending: Cardiovascular Disease

## 2019-11-28 DIAGNOSIS — I10 Essential (primary) hypertension: Secondary | ICD-10-CM | POA: Insufficient documentation

## 2019-11-28 DIAGNOSIS — I2510 Atherosclerotic heart disease of native coronary artery without angina pectoris: Secondary | ICD-10-CM | POA: Insufficient documentation

## 2019-11-28 MED ORDER — PERFLUTREN LIPID MICROSPHERE
1.0000 mL | INTRAVENOUS | Status: AC | PRN
  Administered 2019-11-28: 2 mL via INTRAVENOUS

## 2019-11-29 DIAGNOSIS — R262 Difficulty in walking, not elsewhere classified: Secondary | ICD-10-CM | POA: Diagnosis not present

## 2019-11-29 DIAGNOSIS — M6281 Muscle weakness (generalized): Secondary | ICD-10-CM | POA: Diagnosis not present

## 2019-12-02 ENCOUNTER — Encounter: Payer: Self-pay | Admitting: Internal Medicine

## 2019-12-02 NOTE — Progress Notes (Signed)
History of Present Illness:       This very nice 74 y.o.  MWM presents for 6 month follow up with HTN, HLD,  Pre-Diabetes and Vitamin D Deficiency.  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 Apparently recent PET scan showed mets and he's set up to start Radiation Tx to both hips and Rt shoulder.       Patient is treated for HTN  (1999)  & BP has been controlled at home.  Patient has ASHD s/p multiple stents & is followed by Dr Ena Dawley. In July 2020, he was dx'd with a RML Pulm Embolus and started on Xarelto.  Today's BP is at goal - 122/84. Patient has had no complaints of any cardiac type chest pain, palpitations, dyspnea / orthopnea / PND, dizziness, claudication, or dependent edema.      Hyperlipidemia is not controlled with diet & note patient has hx/o Statin Myopathy.  Last Lipids were at goal:  Lab Results  Component Value Date   CHOL 138 09/02/2019   HDL 40 09/02/2019   LDLCALC 75 09/02/2019   LDLDIRECT 116.0 06/03/2013   TRIG 145 09/02/2019   CHOLHDL 3.5 09/02/2019    Also, the patient has Moderate Obesity  (BMI   ) and  hx/o T2_DM (2012) and after losing weight his A1c's Normalized to 5.5% in 2014 which he has maintained  and has had no symptoms of reactive hypoglycemia, diabetic polys, paresthesias or visual blurring.  Last A1c was Normal & at goal:  Lab Results  Component Value Date   HGBA1C 5.1 06/03/2019           Further, the patient also has history of Vitamin D Deficiency ("45"/ on  tx 2008) and supplements vitamin D without any suspected side-effects. Last vitamin D was elevated & dose was tapered:  Lab Results  Component Value Date   VD25OH 117 (H) 06/03/2019    Current Outpatient Medications on File Prior to Visit  Medication Sig  . acetaminophen (TYLENOL) 500 MG  tablet Take 1 tablet (500 mg total) by mouth every 6 (six) hours as needed.  Marland Kitchen albuterol (VENTOLIN HFA) 108 (90 Base) MCG/ACT inhaler Inhale 2 puffs into the lungs every 4 (four) hours as needed for wheezing or shortness of breath.  . Alpha-Lipoic Acid 200 MG CAPS Take by mouth 2 (two) times daily.  . Ascorbic Acid (VITAMIN C) 1000 MG tablet Take 1,000 mg by mouth 2 (two) times daily.  Marland Kitchen aspirin 325 MG EC tablet Take 325 mg by mouth daily.  . Blood Glucose Monitoring Suppl (FREESTYLE FREEDOM LITE) w/Device KIT Check blood sugar 1 time a day. [id number]  . budesonide-formoterol (SYMBICORT) 160-4.5 MCG/ACT inhaler Use twice daily, wash your mouth afterwards to avoid yeast.  . Cholecalciferol (VITAMIN D3) 5000 units CAPS Take 5,000 Units by mouth See admin instructions. Take 10 caps weekly   . CHOLINE PO Take 600 mg by mouth daily.  Marland Kitchen CINNAMON PO Take by mouth 3 (three) times daily.   . clotrimazole-betamethasone (LOTRISONE) cream Apply to blistering Rash & Groin Rash 3 x / day  . diazepam (VALIUM) 5 MG tablet Take 1/2 to 1 tablet 2 to 3 x /day as needed for Anxiety  . DOXYLAMINE SUCCINATE PO Take 0.5 tablets by mouth at bedtime.   Marland Kitchen  folic acid (FOLVITE) 1 MG tablet Take 1 tablet (1 mg total) by mouth daily.  . furosemide (LASIX) 80 MG tablet Take 80 mg by mouth daily.  Marland Kitchen ipratropium (ATROVENT) 0.03 % nasal spray Place 1-2 sprays into both nostrils daily as needed (for allergies.).  Marland Kitchen lidocaine-prilocaine (EMLA) cream Apply 1 application topically as needed.  . magnesium oxide (MAG-OX) 400 MG tablet Take 1,200 mg by mouth daily.   Marland Kitchen MELATONIN PO Take 30 mg by mouth at bedtime.  . meloxicam (MOBIC) 15 MG tablet Take 1/2 to 1 tablet Daily for Pain & Inflammation and try limit to 4 to 5 days /week to Avoid Kidney Damage  . Multiple Vitamins-Minerals (MULTIVITAMIN ADULT PO) Take by mouth daily.  . nitroGLYCERIN (NITROSTAT) 0.4 MG SL tablet Dissolve 1 tablet under tongue every 3 to 5 minutes if needed  for Chest Pain  . omega-3 acid ethyl esters (LOVAZA) 1 g capsule Take 2 capsules (2 g total) by mouth 2 (two) times daily.  Marland Kitchen OVER THE COUNTER MEDICATION Taking OTC Iron 1 tablet daily.  Marland Kitchen OVER THE COUNTER MEDICATION OTC Nasacrom NS 1 spray each nostril 1 to 2 times daily.  . predniSONE (DELTASONE) 5 MG tablet   . prochlorperazine (COMPAZINE) 10 MG tablet Take 1 tablet (10 mg total) by mouth every 6 (six) hours as needed for nausea or vomiting.  . SUPER B COMPLEX/C PO Take by mouth daily.  . tamsulosin (FLOMAX) 0.4 MG CAPS capsule Take 1 capsule Daily for for Prostate  . terbinafine (LAMISIL) 250 MG tablet Take 1 tablet (250 mg total) by mouth daily.  Marland Kitchen tetrahydrozoline-zinc (VISINE-AC) 0.05-0.25 % ophthalmic solution Place 1-2 drops into both eyes 3 (three) times daily as needed (for redness/irritation.).  Marland Kitchen triamcinolone (NASACORT) 55 MCG/ACT AERO nasal inhaler Place 2 sprays into the nose at bedtime.  . triamcinolone ointment (KENALOG) 0.1 % Apply 1 application topically 2 (two) times daily.  . Turmeric 500 MG TABS Take by mouth. Takes 3 in the morning and 3 in the evening  . valACYclovir (VALTREX) 1000 MG tablet Take 1 tablet   3 x  /day for Viral Blistering Rash  . XARELTO 20 MG TABS tablet TAKE 1 TABLET BY MOUTH EVERY DAY WITH SUPPER  . Zinc 50 MG TABS Take by mouth daily.   No current facility-administered medications on file prior to visit.    Allergies  Allergen Reactions  . Codeine Itching and Other (See Comments)    Also hallucinations   . Meloxicam Rash  . Morphine And Related Other (See Comments)    Hallucinations  . Nsaids Other (See Comments)    BLEEDING--naprosyn  . Oxycodone     Hallucinations  . Crestor [Rosuvastatin] Other (See Comments)    Soreness/aching  . Cymbalta [Duloxetine Hcl] Other (See Comments)    Pt is unsure of reaction type  . Dilaudid [Hydromorphone Hcl] Itching and Other (See Comments)    Hallucinations.  . Effexor [Venlafaxine] Other (See  Comments)    Unsure of reactions type  . Gluten Meal Other (See Comments)    Runny nose (constant); bloating.  . Topamax [Topiramate] Other (See Comments)    Caused visual impairments/swelling in eyes--pinch off optic nerve (temporary blindness)  . Tramadol Hcl   . Trazodone And Nefazodone Other (See Comments)    Unsure of reaction type  . Adhesive [Tape] Other (See Comments)    SKIN PEELS--PAPER TAPE IS OKAY  . Penicillins Rash  . Sulfa Antibiotics Swelling, Rash and Other (See Comments)  Mouth sores    PMHx:   Past Medical History:  Diagnosis Date  . Anginal pain ([place])   . Anxiety    treated /w Xanax for mild depression , using 2 times per day, not PRN  . Asthma   . Benign prostatic hypertrophy   . CAD (coronary artery disease)    pt last left heart cath was in Nov 2008. EF was 55% on left ventriculogram. Circumflex was totally occluded after the 1st obtuse marginal with collaterals supplying the distal circumflex. LAD showed luminal irregularities. The stent in LAD was patent. The RCA showed luminal irregularities. The stent in th emid RCA and PDA were patent. There were no inverventions.   . Chest pain    from anxiety last time 1 month ago  . 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 Chronic obstructive pulmonary disease (HCC)    asthma  . Cluster headache    relative to sinus problems none recnt  . Constipation   . Depression   . DM2 (diabetes mellitus, type 2) ([place])    well with diet and exercise controlled  . GERD (gastroesophageal reflux disease)    hiatal hernia. Patient did have a Nissen fundoplication rare  . Heart murmur    heard 30 yrs ago  . History of blood transfusion    need for Bld. transfusion relative to taking NSAIDS  . HTN (hypertension)    pt. followed by [facility], last cardiac visit 2012, one yr. ago  . Hyperlipidemia   . Joint pain   . Lactose intolerance   . Left kidney mass   . Low back  pain    chronic  . Metastatic non-small cell lung cancer (HCC)    adenocarcinoma, LUL, bone metastasis in 09/2018  . 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 Obesity   . OSA and COPD overlap syndrome ([place]) 02/25/2015   no cpap used pt denies sleep apnea  . Osteoarthritis   . Peptic ulcer disease   . Pneumonia not recent per pt  . 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 Sleep concern    states the study (2003 )was done at [facility]., it failed & he was told to return & he never did. Pt. states he has been found by prev. hosp. staff that he has to be told when to breathe & his wife does the same.   Marland Kitchen Spinal fracture    hx of traumatic in Jun 04, 2007 after falling off the roof  . Tinnitus, bilateral   . Vitamin D deficiency     Immunization History  Administered Date(s) Administered  . DT (Pediatric) 12/09/2014  . Influenza Split 02/17/2012  . Influenza, High Dose Seasonal PF 03/03/2014, 03/11/2015, 04/04/2016, 04/28/2017, 03/05/2018, 02/28/2019  . PFIZER SARS-COV-2 Vaccination 07/04/2019, 07/25/2019  . Pneumococcal Conjugate-13 12/25/2014  . Pneumococcal Polysaccharide-23 01/19/2012  . Pneumococcal-Unspecified 06/13/1997  . Td 06/13/2004  . Zoster 06/14/2007    Past Surgical History:  Procedure Laterality Date  . BACK SURGERY     x3- last surgery lumbar- 1998- / fusion   . blepheroplasty     both eyesx2  . C5-T6 posterior fusion     with [place]asis and radius screws  . [place], 2008 & 2012 multiple stents  total 4  times  . cataracts     cataracts removed- /w IOL - both eyes   . CERVICAL SPINE SURGERY    . COLONOSCOPY    . ESOPHAGEAL MANOMETRY    . HARDWARE REMOVAL  02/20/2012   Procedure: HARDWARE REMOVAL;  Surgeon: [name], MD;  Location: [facility];  Service: Neurosurgery;  Laterality: N/A;  Hardware Removal  . HIATAL HERNIA REPAIR   1979, spring 2009  . IR IMAGING GUIDED PORT INSERTION  10/29/2018  . LAPAROSCOPIC CHOLECYSTECTOMY     & IOC  . multiple percutaneous coronary interventions    . NASAL SINUS SURGERY     x2, sees Dr. [name], still having problems, states he uses Benadryl PRN- up to 5 times per day   . right temporal artery biopsy    . right wrist plate insertion  [id number]/6629  . ROBOTIC ASSITED PARTIAL NEPHRECTOMY Left 03/02/2018   Procedure: XI ROBOTIC ASSITED LEFT PARTIAL NEPHRECTOMY WITH LYSIS OF ADHESIONS X30 MINUTES.;  Surgeon: Ardis Hughs, MD;  Location: [facility];  Service: Urology;  Laterality: Left;  CLAMP TIME FOR KIDNEY 1053-1110 TOTAL 18MINUTES  . Rotator cuff surgery Right   . SKIN BIOPSY Right 10/31/2017   Chest  . TRANSURETHRAL RESECTION OF BLADDER TUMOR N/A 11/03/2017   Procedure: cystoscopy;  Surgeon: Kathie Rhodes, MD;  Location: [facility];  Service: Urology;  Laterality: N/A;  . UMBILICAL HERNIA REPAIR    . UPPER GI ENDOSCOPY    . VENTRAL HERNIA REPAIR N/A 03/02/2018   Procedure: LAPAROSCOPIC REPAIR OF INCARCERATED INCISIONAL HERNIA WITH LYSIS OF ADHESIONS;  Surgeon: Michael Boston, MD;  Location: [facility];  Service: General;  Laterality: N/A;    FHx:    Reviewed / unchanged  SHx:    Reviewed / unchanged   Systems Review:  Constitutional: Denies fever, chills, wt changes, headaches, insomnia, fatigue, night sweats, change in appetite. Eyes: Denies redness, blurred vision, diplopia, discharge, itchy, watery eyes.  ENT: Denies discharge, congestion, post nasal drip, epistaxis, sore throat, earache, hearing loss, dental pain, tinnitus, vertigo, sinus pain, snoring.  CV: Denies chest pain, palpitations, irregular heartbeat, syncope, dyspnea, diaphoresis, orthopnea, PND, claudication or edema. Respiratory: denies cough, dyspnea, DOE, pleurisy, hoarseness, laryngitis, wheezing.  Gastrointestinal: Denies dysphagia, odynophagia, heartburn, reflux, water brash, abdominal pain or cramps, nausea, vomiting,  bloating, diarrhea, constipation, hematemesis, melena, hematochezia  or hemorrhoids. Genitourinary: Denies dysuria, frequency, urgency, nocturia, hesitancy, discharge, hematuria or flank pain. Musculoskeletal: Denies arthralgias, myalgias, stiffness, jt. swelling, pain, limping or strain/sprain.  Skin: Denies pruritus, rash, hives, warts, acne, eczema or change in skin lesion(s). Neuro: No weakness, tremor, incoordination, spasms, paresthesia or pain. Psychiatric: Denies confusion, memory loss or sensory loss. Endo: Denies change in weight, skin or hair change.  Heme/Lymph: No excessive bleeding, bruising or enlarged lymph nodes.  Physical Exam  BP 122/84   Pulse 80   Temp (!) 97.4 F (36.3 C)   Resp 16   Ht 5' 7" (1.702 m)   Wt 210 lb 6.4 oz (95.4 kg)   SpO2 97%   BMI 32.95 kg/m   Appears  well nourished, well groomed  and in no distress.  Eyes: PERRLA, EOMs, conjunctiva no swelling or erythema. Sinuses: No frontal/maxillary tenderness ENT/Mouth: EAC's clear, TM's nl w/o erythema, bulging. Nares clear w/o erythema, swelling, exudates. Oropharynx clear without erythema or exudates. Oral hygiene is good. Tongue normal, non obstructing. Hearing intact.  Neck: Supple. Thyroid not palpable. Car 2+/2+ without bruits, nodes or JVD. Chest: Respirations nl with BS clear &  equal w/o rales, rhonchi, wheezing or stridor.  Cor: Heart sounds normal w/ regular rate and rhythm without sig. murmurs, gallops, clicks or rubs. Peripheral pulses normal and equal  without edema.  Abdomen: Soft & bowel sounds normal. Non-tender w/o guarding, rebound, hernias, masses or organomegaly.  Lymphatics: Unremarkable.  Musculoskeletal: Full ROM all peripheral extremities, joint stability, 5/5 strength and normal gait.  Skin: Warm, dry without exposed rashes, lesions or ecchymosis apparent.  Neuro: Cranial nerves intact, reflexes equal bilaterally. Sensory-motor testing grossly intact. Tendon reflexes grossly  intact.  Pysch: Alert & oriented x 3.  Insight and judgement nl & appropriate. No ideations.  Assessment and Plan:  1. Essential hypertension  - Continue medication, monitor blood pressure at home.  - Continue DASH diet.  Reminder to go to the ER if any CP,  SOB, nausea, dizziness, severe HA, changes vision/speech.  - CBC with Differential/Platelet - COMPLETE METABOLIC PANEL WITH GFR - Magnesium  2. Hyperlipidemia, mixed  - Continue diet/meds, exercise,& lifestyle modifications.  - Continue monitor periodic cholesterol/liver & renal functions   - TSH  3. Abnormal glucose  - Continue diet, exercise  - Lifestyle modifications.  - Monitor appropriate labs.  - Hemoglobin A1c - Insulin, random  4. Vitamin D deficiency   - Continue supplementation.  - VITAMIN D 25 Hydroxy   5. Atherosclerosis of native coronary artery of native  heart without angina pectoris  - Lipid panel  6. Stage 3 chronic kidney disease  - COMPLETE METABOLIC PANEL WITH GFR  7. Adenocarcinoma of left lung, stage 4 ([place])   8. Statin myopathy   9. Medication management  - CBC with Differential/Platelet - COMPLETE METABOLIC PANEL WITH GFR - Magnesium - Lipid panel - TSH - Hemoglobin A1c - Insulin, random - VITAMIN D 25 Hydroxy        Discussed  regular exercise, BP monitoring, weight control to achieve/maintain BMI less than 25 and discussed med and SE's. Recommended labs to assess and monitor clinical status with further disposition pending results of labs.  I discussed the assessment and treatment plan with the patient. The patient was provided an opportunity to ask questions and all were answered. The patient agreed with the plan and demonstrated an understanding of the instructions.  I provided over 30 minutes of exam, counseling, chart review and  complex critical decision making.   Kirtland Bouchard, MD

## 2019-12-02 NOTE — Patient Instructions (Signed)

## 2019-12-03 ENCOUNTER — Ambulatory Visit (INDEPENDENT_AMBULATORY_CARE_PROVIDER_SITE_OTHER): Payer: Medicare Other | Admitting: Internal Medicine

## 2019-12-03 ENCOUNTER — Other Ambulatory Visit: Payer: Self-pay

## 2019-12-03 VITALS — BP 122/84 | HR 80 | Temp 97.4°F | Resp 16 | Ht 67.0 in | Wt 210.4 lb

## 2019-12-03 DIAGNOSIS — E559 Vitamin D deficiency, unspecified: Secondary | ICD-10-CM

## 2019-12-03 DIAGNOSIS — I2510 Atherosclerotic heart disease of native coronary artery without angina pectoris: Secondary | ICD-10-CM

## 2019-12-03 DIAGNOSIS — C3492 Malignant neoplasm of unspecified part of left bronchus or lung: Secondary | ICD-10-CM

## 2019-12-03 DIAGNOSIS — N183 Chronic kidney disease, stage 3 unspecified: Secondary | ICD-10-CM

## 2019-12-03 DIAGNOSIS — G720 Drug-induced myopathy: Secondary | ICD-10-CM

## 2019-12-03 DIAGNOSIS — E782 Mixed hyperlipidemia: Secondary | ICD-10-CM

## 2019-12-03 DIAGNOSIS — R7309 Other abnormal glucose: Secondary | ICD-10-CM

## 2019-12-03 DIAGNOSIS — I10 Essential (primary) hypertension: Secondary | ICD-10-CM

## 2019-12-03 DIAGNOSIS — Z79899 Other long term (current) drug therapy: Secondary | ICD-10-CM

## 2019-12-04 LAB — COMPLETE METABOLIC PANEL WITH GFR
AG Ratio: 1.1 (calc) (ref 1.0–2.5)
ALT: 29 U/L (ref 9–46)
AST: 23 U/L (ref 10–35)
Albumin: 3.4 g/dL — ABNORMAL LOW (ref 3.6–5.1)
Alkaline phosphatase (APISO): 217 U/L — ABNORMAL HIGH (ref 35–144)
BUN/Creatinine Ratio: 46 (calc) — ABNORMAL HIGH (ref 6–22)
BUN: 54 mg/dL — ABNORMAL HIGH (ref 7–25)
CO2: 25 mmol/L (ref 20–32)
Calcium: 9 mg/dL (ref 8.6–10.3)
Chloride: 96 mmol/L — ABNORMAL LOW (ref 98–110)
Creat: 1.17 mg/dL (ref 0.70–1.18)
GFR, Est African American: 70 mL/min/{1.73_m2} (ref >=60)
GFR, Est Non African American: 60 mL/min/{1.73_m2} (ref >=60)
Globulin: 3.2 g/dL (calc) (ref 1.9–3.7)
Glucose, Bld: 62 mg/dL — ABNORMAL LOW (ref 65–99)
Potassium: 4.5 mmol/L (ref 3.5–5.3)
Sodium: 132 mmol/L — ABNORMAL LOW (ref 135–146)
Total Bilirubin: 0.5 mg/dL (ref 0.2–1.2)
Total Protein: 6.6 g/dL (ref 6.1–8.1)

## 2019-12-04 LAB — LIPID PANEL
Cholesterol: 179 mg/dL (ref <200)
HDL: 65 mg/dL (ref >=40)
LDL Cholesterol (Calc): 87 mg/dL (calc)
Non-HDL Cholesterol (Calc): 114 mg/dL (calc) (ref <130)
Total CHOL/HDL Ratio: 2.8 (calc) (ref <5.0)
Triglycerides: 170 mg/dL — ABNORMAL HIGH (ref <150)

## 2019-12-04 LAB — CBC WITH DIFFERENTIAL/PLATELET
Absolute Monocytes: 1586 cells/uL — ABNORMAL HIGH (ref 200–950)
Basophils Absolute: 32 cells/uL (ref 0–200)
Basophils Relative: 0.3 %
Eosinophils Absolute: 0 cells/uL — ABNORMAL LOW (ref 15–500)
Eosinophils Relative: 0 %
HCT: 41.1 % (ref 38.5–50.0)
Hemoglobin: 14.2 g/dL (ref 13.2–17.1)
Lymphs Abs: 1281 cells/uL (ref 850–3900)
MCH: 33.4 pg — ABNORMAL HIGH (ref 27.0–33.0)
MCHC: 34.5 g/dL (ref 32.0–36.0)
MCV: 96.7 fL (ref 80.0–100.0)
MPV: 9.2 fL (ref 7.5–12.5)
Monocytes Relative: 15.1 %
Neutro Abs: 7602 cells/uL (ref 1500–7800)
Neutrophils Relative %: 72.4 %
Platelets: 196 10*3/uL (ref 140–400)
RBC: 4.25 10*6/uL (ref 4.20–5.80)
RDW: 14.5 % (ref 11.0–15.0)
Total Lymphocyte: 12.2 %
WBC: 10.5 10*3/uL (ref 3.8–10.8)

## 2019-12-04 LAB — HEMOGLOBIN A1C
Hgb A1c MFr Bld: 5.5 % of total Hgb (ref <5.7)
Mean Plasma Glucose: 111 (calc)
eAG (mmol/L): 6.2 (calc)

## 2019-12-04 LAB — VITAMIN D 25 HYDROXY (VIT D DEFICIENCY, FRACTURES): Vit D, 25-Hydroxy: 113 ng/mL — ABNORMAL HIGH (ref 30–100)

## 2019-12-04 LAB — MAGNESIUM: Magnesium: 2.2 mg/dL (ref 1.5–2.5)

## 2019-12-04 LAB — TSH: TSH: 0.26 mIU/L — ABNORMAL LOW (ref 0.40–4.50)

## 2019-12-04 LAB — INSULIN, RANDOM: Insulin: 44.7 u[IU]/mL — ABNORMAL HIGH

## 2019-12-04 NOTE — Progress Notes (Signed)
==========================================================  -    CBC - OK  ==========================================================  -  Kidney functions again suggest mild dehydration - So it's very important  to drink more water - at least 6 bottles of 16 oz of water or liquids /day  ==========================================================  -  Total Chol = 179 and LDL Chol  = 87 - Excellent ==========================================================  -  Thyroid looks a little high in blood , so will need to monitor it closely at  subsequent office visits  ==========================================================  -  Vitamin D = 113 - also a little elevated (goal is between 70-100)   - So recommend cut your Vitamin D 5,000 down to Only 1 capsule Daily  ==========================================================  -  All Else - CBC - Kidneys - Electrolytes -  Liver - Magnesium & Thyroid   - all  Normal / OK ==========================================================  -  All Else - CBC - Kidneys - Electrolytes -  Liver - Magnesium & Thyroid  - all  Normal / OK ==========================================================

## 2019-12-10 ENCOUNTER — Other Ambulatory Visit: Payer: Self-pay | Admitting: Internal Medicine

## 2019-12-10 MED ORDER — TERBINAFINE HCL 250 MG PO TABS: ORAL_TABLET | ORAL | 0 refills | Status: DC

## 2019-12-11 IMAGING — XA IR LEFT FLUORO GUIDE CV LINE
1 series · 1 of 1 positions shown · non-contrast
Comparison: none

CLINICAL DATA: metastatic small cell lung ca, needs durable venous
access for planned chemotherapy regimen
TECHNIQUE: The procedure, risks, benefits, and alternatives were explained to
the patient. Questions regarding the procedure were encouraged and
answered. The patient understands and consents to the procedure. As
antibiotic prophylaxis, vancomycin 1 g was ordered pre-procedure and
administered intravenously within one hour of incision. Patency of
the right IJ vein was confirmed with ultrasound with image
documentation. An appropriate skin site was determined. Skin site
was marked. Region was prepped using maximum barrier technique
including cap and mask, sterile gown, sterile gloves, large sterile
sheet, and Chlorhexidine as cutaneous antisepsis. The region was
infiltrated locally with 1% lidocaine. Under real-time ultrasound
guidance, the right IJ vein was accessed with a 21 gauge
micropuncture needle; the needle tip within the vein was confirmed
with ultrasound image documentation. Needle was exchanged over a 018
guidewire for transitional dilator which allowed passage of the
Benson wire into the IVC. Over this, the transitional dilator was
exchanged for a 5 French MPA catheter. A small incision was made on
the right anterior chest wall and a subcutaneous pocket fashioned.
The power-injectable port was positioned and its catheter tunneled
to the right IJ dermatotomy site. The MPA catheter was exchanged
over an Amplatz wire for a peel-away sheath, through which the port
catheter, which had been trimmed to the appropriate length, was
advanced and positioned under fluoroscopy with its tip at the
cavoatrial junction. Spot chest radiograph confirms good catheter
position and no pneumothorax. The pocket was closed with deep
interrupted and subcuticular continuous 3-0 Monocryl sutures. The
port was flushed per protocol. The incisions were covered with
Dermabond then covered with a sterile dressing.

COMPLICATIONS:
COMPLICATIONS
None immediate

[Series 300: line placements · 1 of 1 slices shown]
[im 1/1]
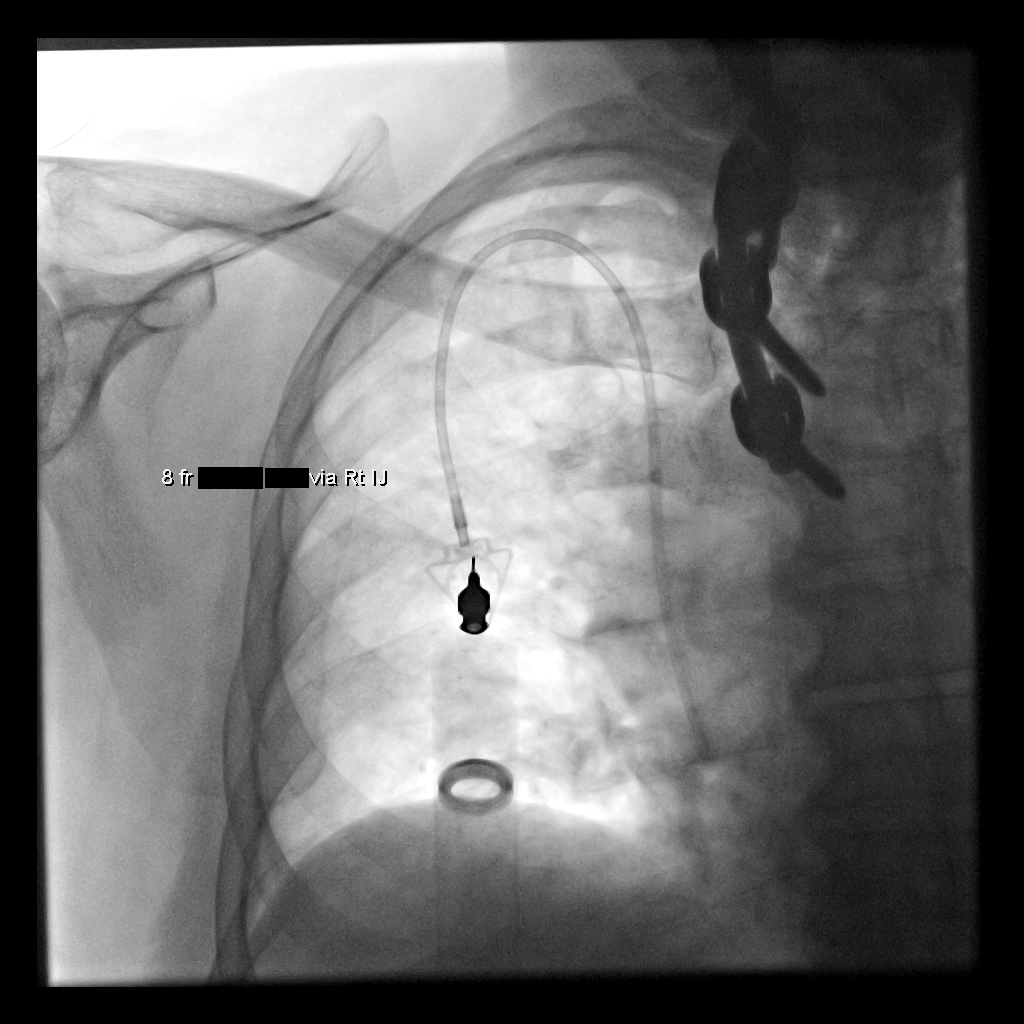

[1 of 1 positions shown; findings below may reference images not displayed]

EXAM:
TUNNELED PORT CATHETER PLACEMENT WITH ULTRASOUND AND FLUOROSCOPIC
GUIDANCE

FLUOROSCOPY TIME:  0.1 minutes; 30 [id number] DAP

ANESTHESIA/SEDATION:
Intravenous Fentanyl 322mcg and Versed 2mg were administered as
conscious sedation during continuous monitoring of the patient's
level of consciousness and physiological / cardiorespiratory status
by the radiology RN, with a total moderate sedation time of 12
minutes.
IMPRESSION: Technically successful right IJ power-injectable port catheter
placement. Ready for routine use.

## 2019-12-12 DIAGNOSIS — C3412 Malignant neoplasm of upper lobe, left bronchus or lung: Secondary | ICD-10-CM | POA: Diagnosis not present

## 2019-12-12 DIAGNOSIS — R5383 Other fatigue: Secondary | ICD-10-CM | POA: Diagnosis not present

## 2019-12-12 DIAGNOSIS — K5900 Constipation, unspecified: Secondary | ICD-10-CM | POA: Diagnosis not present

## 2019-12-12 DIAGNOSIS — C7951 Secondary malignant neoplasm of bone: Secondary | ICD-10-CM | POA: Diagnosis not present

## 2019-12-12 DIAGNOSIS — Z510 Encounter for antineoplastic radiation therapy: Secondary | ICD-10-CM | POA: Diagnosis not present

## 2019-12-12 DIAGNOSIS — R102 Pelvic and perineal pain: Secondary | ICD-10-CM | POA: Diagnosis not present

## 2019-12-13 DIAGNOSIS — C3412 Malignant neoplasm of upper lobe, left bronchus or lung: Secondary | ICD-10-CM | POA: Diagnosis not present

## 2019-12-13 DIAGNOSIS — R102 Pelvic and perineal pain: Secondary | ICD-10-CM | POA: Diagnosis not present

## 2019-12-13 DIAGNOSIS — Z510 Encounter for antineoplastic radiation therapy: Secondary | ICD-10-CM | POA: Diagnosis not present

## 2019-12-13 DIAGNOSIS — R5383 Other fatigue: Secondary | ICD-10-CM | POA: Diagnosis not present

## 2019-12-13 DIAGNOSIS — C7951 Secondary malignant neoplasm of bone: Secondary | ICD-10-CM | POA: Diagnosis not present

## 2019-12-13 DIAGNOSIS — K5900 Constipation, unspecified: Secondary | ICD-10-CM | POA: Diagnosis not present

## 2019-12-17 DIAGNOSIS — K5900 Constipation, unspecified: Secondary | ICD-10-CM | POA: Diagnosis not present

## 2019-12-17 DIAGNOSIS — C7951 Secondary malignant neoplasm of bone: Secondary | ICD-10-CM | POA: Diagnosis not present

## 2019-12-17 DIAGNOSIS — C3412 Malignant neoplasm of upper lobe, left bronchus or lung: Secondary | ICD-10-CM | POA: Diagnosis not present

## 2019-12-17 DIAGNOSIS — R5383 Other fatigue: Secondary | ICD-10-CM | POA: Diagnosis not present

## 2019-12-17 DIAGNOSIS — Z510 Encounter for antineoplastic radiation therapy: Secondary | ICD-10-CM | POA: Diagnosis not present

## 2019-12-17 DIAGNOSIS — R102 Pelvic and perineal pain: Secondary | ICD-10-CM | POA: Diagnosis not present

## 2019-12-18 ENCOUNTER — Other Ambulatory Visit: Payer: Self-pay | Admitting: Internal Medicine

## 2019-12-18 DIAGNOSIS — R102 Pelvic and perineal pain: Secondary | ICD-10-CM | POA: Diagnosis not present

## 2019-12-18 DIAGNOSIS — Z510 Encounter for antineoplastic radiation therapy: Secondary | ICD-10-CM | POA: Diagnosis not present

## 2019-12-18 DIAGNOSIS — C7951 Secondary malignant neoplasm of bone: Secondary | ICD-10-CM | POA: Diagnosis not present

## 2019-12-18 DIAGNOSIS — K5900 Constipation, unspecified: Secondary | ICD-10-CM | POA: Diagnosis not present

## 2019-12-18 DIAGNOSIS — C3412 Malignant neoplasm of upper lobe, left bronchus or lung: Secondary | ICD-10-CM | POA: Diagnosis not present

## 2019-12-18 DIAGNOSIS — R5383 Other fatigue: Secondary | ICD-10-CM | POA: Diagnosis not present

## 2019-12-18 MED ORDER — TAMSULOSIN HCL 0.4 MG PO CAPS: ORAL_CAPSULE | ORAL | 0 refills | Status: AC

## 2019-12-19 DIAGNOSIS — Z510 Encounter for antineoplastic radiation therapy: Secondary | ICD-10-CM | POA: Diagnosis not present

## 2019-12-19 DIAGNOSIS — C3412 Malignant neoplasm of upper lobe, left bronchus or lung: Secondary | ICD-10-CM | POA: Diagnosis not present

## 2019-12-19 DIAGNOSIS — R102 Pelvic and perineal pain: Secondary | ICD-10-CM | POA: Diagnosis not present

## 2019-12-19 DIAGNOSIS — R5383 Other fatigue: Secondary | ICD-10-CM | POA: Diagnosis not present

## 2019-12-19 DIAGNOSIS — C7951 Secondary malignant neoplasm of bone: Secondary | ICD-10-CM | POA: Diagnosis not present

## 2019-12-19 DIAGNOSIS — K5900 Constipation, unspecified: Secondary | ICD-10-CM | POA: Diagnosis not present

## 2019-12-20 DIAGNOSIS — R102 Pelvic and perineal pain: Secondary | ICD-10-CM | POA: Diagnosis not present

## 2019-12-20 DIAGNOSIS — C7951 Secondary malignant neoplasm of bone: Secondary | ICD-10-CM | POA: Diagnosis not present

## 2019-12-20 DIAGNOSIS — C3412 Malignant neoplasm of upper lobe, left bronchus or lung: Secondary | ICD-10-CM | POA: Diagnosis not present

## 2019-12-20 DIAGNOSIS — K5900 Constipation, unspecified: Secondary | ICD-10-CM | POA: Diagnosis not present

## 2019-12-20 DIAGNOSIS — R5383 Other fatigue: Secondary | ICD-10-CM | POA: Diagnosis not present

## 2019-12-20 DIAGNOSIS — Z510 Encounter for antineoplastic radiation therapy: Secondary | ICD-10-CM | POA: Diagnosis not present

## 2019-12-23 DIAGNOSIS — R102 Pelvic and perineal pain: Secondary | ICD-10-CM | POA: Diagnosis not present

## 2019-12-23 DIAGNOSIS — C3492 Malignant neoplasm of unspecified part of left bronchus or lung: Secondary | ICD-10-CM | POA: Diagnosis not present

## 2019-12-23 DIAGNOSIS — R5383 Other fatigue: Secondary | ICD-10-CM | POA: Diagnosis not present

## 2019-12-23 DIAGNOSIS — C7951 Secondary malignant neoplasm of bone: Secondary | ICD-10-CM | POA: Diagnosis not present

## 2019-12-23 DIAGNOSIS — K5900 Constipation, unspecified: Secondary | ICD-10-CM | POA: Diagnosis not present

## 2019-12-23 DIAGNOSIS — C3412 Malignant neoplasm of upper lobe, left bronchus or lung: Secondary | ICD-10-CM | POA: Diagnosis not present

## 2019-12-23 DIAGNOSIS — Z510 Encounter for antineoplastic radiation therapy: Secondary | ICD-10-CM | POA: Diagnosis not present

## 2019-12-24 DIAGNOSIS — Z510 Encounter for antineoplastic radiation therapy: Secondary | ICD-10-CM | POA: Diagnosis not present

## 2019-12-24 DIAGNOSIS — C3412 Malignant neoplasm of upper lobe, left bronchus or lung: Secondary | ICD-10-CM | POA: Diagnosis not present

## 2019-12-24 DIAGNOSIS — R5383 Other fatigue: Secondary | ICD-10-CM | POA: Diagnosis not present

## 2019-12-24 DIAGNOSIS — K5900 Constipation, unspecified: Secondary | ICD-10-CM | POA: Diagnosis not present

## 2019-12-24 DIAGNOSIS — C3492 Malignant neoplasm of unspecified part of left bronchus or lung: Secondary | ICD-10-CM | POA: Diagnosis not present

## 2019-12-24 DIAGNOSIS — C7951 Secondary malignant neoplasm of bone: Secondary | ICD-10-CM | POA: Diagnosis not present

## 2019-12-24 DIAGNOSIS — R102 Pelvic and perineal pain: Secondary | ICD-10-CM | POA: Diagnosis not present

## 2019-12-25 DIAGNOSIS — C3492 Malignant neoplasm of unspecified part of left bronchus or lung: Secondary | ICD-10-CM | POA: Diagnosis not present

## 2019-12-25 DIAGNOSIS — C3412 Malignant neoplasm of upper lobe, left bronchus or lung: Secondary | ICD-10-CM | POA: Diagnosis not present

## 2019-12-25 DIAGNOSIS — C7951 Secondary malignant neoplasm of bone: Secondary | ICD-10-CM | POA: Diagnosis not present

## 2019-12-25 DIAGNOSIS — R5383 Other fatigue: Secondary | ICD-10-CM | POA: Diagnosis not present

## 2019-12-25 DIAGNOSIS — K5900 Constipation, unspecified: Secondary | ICD-10-CM | POA: Diagnosis not present

## 2019-12-25 DIAGNOSIS — Z510 Encounter for antineoplastic radiation therapy: Secondary | ICD-10-CM | POA: Diagnosis not present

## 2019-12-25 DIAGNOSIS — R102 Pelvic and perineal pain: Secondary | ICD-10-CM | POA: Diagnosis not present

## 2019-12-26 DIAGNOSIS — C7951 Secondary malignant neoplasm of bone: Secondary | ICD-10-CM | POA: Diagnosis not present

## 2019-12-26 DIAGNOSIS — C3412 Malignant neoplasm of upper lobe, left bronchus or lung: Secondary | ICD-10-CM | POA: Diagnosis not present

## 2019-12-26 DIAGNOSIS — Z510 Encounter for antineoplastic radiation therapy: Secondary | ICD-10-CM | POA: Diagnosis not present

## 2019-12-26 DIAGNOSIS — R102 Pelvic and perineal pain: Secondary | ICD-10-CM | POA: Diagnosis not present

## 2019-12-26 DIAGNOSIS — C3492 Malignant neoplasm of unspecified part of left bronchus or lung: Secondary | ICD-10-CM | POA: Diagnosis not present

## 2019-12-26 DIAGNOSIS — R5383 Other fatigue: Secondary | ICD-10-CM | POA: Diagnosis not present

## 2019-12-26 DIAGNOSIS — K5900 Constipation, unspecified: Secondary | ICD-10-CM | POA: Diagnosis not present

## 2020-01-14 DIAGNOSIS — Z79899 Other long term (current) drug therapy: Secondary | ICD-10-CM | POA: Diagnosis not present

## 2020-01-14 DIAGNOSIS — C9110 Chronic lymphocytic leukemia of B-cell type not having achieved remission: Secondary | ICD-10-CM | POA: Diagnosis not present

## 2020-01-14 DIAGNOSIS — R590 Localized enlarged lymph nodes: Secondary | ICD-10-CM | POA: Diagnosis not present

## 2020-01-14 DIAGNOSIS — E785 Hyperlipidemia, unspecified: Secondary | ICD-10-CM | POA: Diagnosis not present

## 2020-01-14 DIAGNOSIS — C3492 Malignant neoplasm of unspecified part of left bronchus or lung: Secondary | ICD-10-CM | POA: Diagnosis not present

## 2020-01-14 DIAGNOSIS — I10 Essential (primary) hypertension: Secondary | ICD-10-CM | POA: Diagnosis not present

## 2020-01-14 DIAGNOSIS — Z95828 Presence of other vascular implants and grafts: Secondary | ICD-10-CM | POA: Diagnosis not present

## 2020-01-22 DIAGNOSIS — C3412 Malignant neoplasm of upper lobe, left bronchus or lung: Secondary | ICD-10-CM | POA: Diagnosis not present

## 2020-01-22 DIAGNOSIS — Z5111 Encounter for antineoplastic chemotherapy: Secondary | ICD-10-CM | POA: Diagnosis not present

## 2020-01-22 DIAGNOSIS — C7951 Secondary malignant neoplasm of bone: Secondary | ICD-10-CM | POA: Diagnosis not present

## 2020-01-28 DIAGNOSIS — R918 Other nonspecific abnormal finding of lung field: Secondary | ICD-10-CM | POA: Diagnosis not present

## 2020-01-28 DIAGNOSIS — D709 Neutropenia, unspecified: Secondary | ICD-10-CM | POA: Diagnosis not present

## 2020-01-28 DIAGNOSIS — D6959 Other secondary thrombocytopenia: Secondary | ICD-10-CM | POA: Diagnosis not present

## 2020-01-28 DIAGNOSIS — C3492 Malignant neoplasm of unspecified part of left bronchus or lung: Secondary | ICD-10-CM | POA: Diagnosis not present

## 2020-01-31 DIAGNOSIS — D709 Neutropenia, unspecified: Secondary | ICD-10-CM | POA: Diagnosis present

## 2020-01-31 DIAGNOSIS — Z20822 Contact with and (suspected) exposure to covid-19: Secondary | ICD-10-CM | POA: Diagnosis present

## 2020-01-31 DIAGNOSIS — J449 Chronic obstructive pulmonary disease, unspecified: Secondary | ICD-10-CM | POA: Diagnosis present

## 2020-01-31 DIAGNOSIS — Z7982 Long term (current) use of aspirin: Secondary | ICD-10-CM | POA: Diagnosis not present

## 2020-01-31 DIAGNOSIS — I110 Hypertensive heart disease with heart failure: Secondary | ICD-10-CM | POA: Diagnosis present

## 2020-01-31 DIAGNOSIS — Z801 Family history of malignant neoplasm of trachea, bronchus and lung: Secondary | ICD-10-CM | POA: Diagnosis not present

## 2020-01-31 DIAGNOSIS — E785 Hyperlipidemia, unspecified: Secondary | ICD-10-CM | POA: Diagnosis present

## 2020-01-31 DIAGNOSIS — K219 Gastro-esophageal reflux disease without esophagitis: Secondary | ICD-10-CM | POA: Diagnosis present

## 2020-01-31 DIAGNOSIS — C3412 Malignant neoplasm of upper lobe, left bronchus or lung: Secondary | ICD-10-CM | POA: Diagnosis present

## 2020-01-31 DIAGNOSIS — E119 Type 2 diabetes mellitus without complications: Secondary | ICD-10-CM | POA: Diagnosis present

## 2020-01-31 DIAGNOSIS — R000 Tachycardia, unspecified: Secondary | ICD-10-CM | POA: Diagnosis not present

## 2020-01-31 DIAGNOSIS — Z79899 Other long term (current) drug therapy: Secondary | ICD-10-CM | POA: Diagnosis not present

## 2020-01-31 DIAGNOSIS — R0602 Shortness of breath: Secondary | ICD-10-CM | POA: Diagnosis not present

## 2020-01-31 DIAGNOSIS — K5900 Constipation, unspecified: Secondary | ICD-10-CM | POA: Diagnosis present

## 2020-01-31 DIAGNOSIS — R079 Chest pain, unspecified: Secondary | ICD-10-CM | POA: Diagnosis not present

## 2020-01-31 DIAGNOSIS — C3492 Malignant neoplasm of unspecified part of left bronchus or lung: Secondary | ICD-10-CM | POA: Diagnosis not present

## 2020-01-31 DIAGNOSIS — Z87891 Personal history of nicotine dependence: Secondary | ICD-10-CM | POA: Diagnosis not present

## 2020-01-31 DIAGNOSIS — I2510 Atherosclerotic heart disease of native coronary artery without angina pectoris: Secondary | ICD-10-CM | POA: Diagnosis present

## 2020-01-31 DIAGNOSIS — I5033 Acute on chronic diastolic (congestive) heart failure: Secondary | ICD-10-CM | POA: Diagnosis present

## 2020-01-31 DIAGNOSIS — J329 Chronic sinusitis, unspecified: Secondary | ICD-10-CM | POA: Diagnosis present

## 2020-01-31 DIAGNOSIS — Z86711 Personal history of pulmonary embolism: Secondary | ICD-10-CM | POA: Diagnosis not present

## 2020-01-31 DIAGNOSIS — Z7901 Long term (current) use of anticoagulants: Secondary | ICD-10-CM | POA: Diagnosis not present

## 2020-01-31 DIAGNOSIS — I4510 Unspecified right bundle-branch block: Secondary | ICD-10-CM | POA: Diagnosis present

## 2020-01-31 DIAGNOSIS — C7951 Secondary malignant neoplasm of bone: Secondary | ICD-10-CM | POA: Diagnosis present

## 2020-01-31 DIAGNOSIS — Z800 Family history of malignant neoplasm of digestive organs: Secondary | ICD-10-CM | POA: Diagnosis not present

## 2020-01-31 DIAGNOSIS — J9811 Atelectasis: Secondary | ICD-10-CM | POA: Diagnosis present

## 2020-01-31 DIAGNOSIS — R600 Localized edema: Secondary | ICD-10-CM | POA: Diagnosis not present

## 2020-01-31 DIAGNOSIS — F419 Anxiety disorder, unspecified: Secondary | ICD-10-CM | POA: Diagnosis present

## 2020-01-31 DIAGNOSIS — R509 Fever, unspecified: Secondary | ICD-10-CM | POA: Diagnosis present

## 2020-01-31 DIAGNOSIS — Z803 Family history of malignant neoplasm of breast: Secondary | ICD-10-CM | POA: Diagnosis not present

## 2020-02-02 DIAGNOSIS — R000 Tachycardia, unspecified: Secondary | ICD-10-CM | POA: Diagnosis not present

## 2020-02-02 DIAGNOSIS — I4510 Unspecified right bundle-branch block: Secondary | ICD-10-CM | POA: Diagnosis not present

## 2020-02-06 ENCOUNTER — Encounter: Payer: Self-pay | Admitting: Internal Medicine

## 2020-02-06 ENCOUNTER — Ambulatory Visit (INDEPENDENT_AMBULATORY_CARE_PROVIDER_SITE_OTHER): Payer: Medicare Other | Admitting: Internal Medicine

## 2020-02-06 ENCOUNTER — Other Ambulatory Visit: Payer: Self-pay

## 2020-02-06 VITALS — BP 110/72 | HR 104 | Temp 97.0°F | Resp 18 | Ht 67.0 in | Wt 203.6 lb

## 2020-02-06 DIAGNOSIS — C7951 Secondary malignant neoplasm of bone: Secondary | ICD-10-CM

## 2020-02-06 DIAGNOSIS — Z79899 Other long term (current) drug therapy: Secondary | ICD-10-CM

## 2020-02-06 DIAGNOSIS — R609 Edema, unspecified: Secondary | ICD-10-CM | POA: Diagnosis not present

## 2020-02-06 DIAGNOSIS — T451X5A Adverse effect of antineoplastic and immunosuppressive drugs, initial encounter: Secondary | ICD-10-CM

## 2020-02-06 DIAGNOSIS — C3492 Malignant neoplasm of unspecified part of left bronchus or lung: Secondary | ICD-10-CM | POA: Diagnosis not present

## 2020-02-06 DIAGNOSIS — D701 Agranulocytosis secondary to cancer chemotherapy: Secondary | ICD-10-CM

## 2020-02-06 DIAGNOSIS — R079 Chest pain, unspecified: Secondary | ICD-10-CM

## 2020-02-06 DIAGNOSIS — R0602 Shortness of breath: Secondary | ICD-10-CM

## 2020-02-06 DIAGNOSIS — Z86711 Personal history of pulmonary embolism: Secondary | ICD-10-CM

## 2020-02-06 NOTE — Progress Notes (Signed)
[place]     This very nice 74 y.o. MWM  was admitted to the hospital on  01/31/2020  and patient was discharged from the hospital on  02/03/2020   . The patient now presents for follow up for transition from recent hospitalization.  The day after discharge  our clinical staff contacted the patient to assure stability and schedule a follow up appointment. The discharge summary, medications and diagnostic test results were reviewed before meeting with the patient. The patient was admitted for:   . Chest pain, unspecified type  . Adenocarcinoma, lung, left ([place])  . Bone metastasis ([place])  . Shortness of breath  . Peripheral edema  . Chemotherapy-induced neutropenia ([place])  . Hx of pulmonary embolus      Patient was admitted to [facility] with c/o CP and SOB to r/o PE and cardiac ischemia. Oxygen sats averaged about 96% with ambulation. Patient remained stable during observation and was discharged to continue Chemo-radiation      Hospitalization discharge instructions and medications are reconciled with the patient.       Patient is also followed with Hypertension, Hyperlipidemia, Pre-Diabetes and Vitamin D Deficiency.       Patient is treated for HTN  (1999)  & BP has been controlled at home. Today's BP: is at goal -  110/72. Patient has had no complaints of any cardiac type chest pain, palpitations, dyspnea/orthopnea/PND, dizziness, claudication, or dependent edema.      Hyperlipidemia is controlled with diet & meds. Patient denies myalgias or other med SE's. Last Lipids were not at goal: Lab Results  Component Value Date   CHOL 179 12/03/2019   HDL 65 12/03/2019   LDLCALC 87 12/03/2019   LDLDIRECT 116.0 06/03/2013   TRIG 170 (H) 12/03/2019   CHOLHDL 2.8 12/03/2019      Also, the patient has history of T2_NIDDM  (2012)  Which normalized after weight loss.   He has had no symptoms of reactive hypoglycemia, diabetic polys, paresthesias or visual blurring.  Last A1c was  Lab  Results  Component Value Date   HGBA1C 5.5 12/03/2019       Further, the patient also has history of Vitamin D Deficiency ("45"/ 2008)  and supplements vitamin D without any suspected side-effects. Last vitamin D was sl elevated: Lab Results  Component Value Date   VD25OH 113 (H) 12/03/2019    Current Outpatient Medications on File Prior to Visit  Medication Sig  . acetaminophen (TYLENOL) 500 MG tablet Take 1 tablet (500 mg total) by mouth every 6 (six) hours as needed.  Marland Kitchen albuterol (VENTOLIN HFA) 108 (90 Base) MCG/ACT inhaler Inhale 2 puffs into the lungs every 4 (four) hours as needed for wheezing or shortness of breath.  . Alpha-Lipoic Acid 200 MG CAPS Take by mouth 2 (two) times daily.  . Ascorbic Acid (VITAMIN C) 1000 MG tablet Take 1,000 mg by mouth 2 (two) times daily.  Marland Kitchen aspirin 325 MG EC tablet Take 325 mg by mouth daily.  . Blood Glucose Monitoring Suppl (FREESTYLE FREEDOM LITE) w/Device KIT Check blood sugar 1 time a day. [id number]  . budesonide-formoterol (SYMBICORT) 160-4.5 MCG/ACT inhaler Use twice daily, wash your mouth afterwards to avoid yeast.  . Cholecalciferol (VITAMIN D3) 5000 units CAPS Take 5,000 Units by mouth See admin instructions. Take 10 caps weekly   . CHOLINE PO Take 600 mg by mouth daily.  Marland Kitchen CINNAMON PO Take by mouth 3 (three) times daily.   . clotrimazole-betamethasone (LOTRISONE) cream Apply  to blistering Rash & Groin Rash 3 x / day  . diazepam (VALIUM) 5 MG tablet Take 1/2 to 1 tablet 2 to 3 x /day as needed for Anxiety  . DOXYLAMINE SUCCINATE PO Take 0.5 tablets by mouth at bedtime.   Marland Kitchen ELIQUIS 5 MG TABS tablet Take 5 mg by mouth 2 (two) times daily.  . folic acid (FOLVITE) 1 MG tablet Take 1 tablet (1 mg total) by mouth daily.  . furosemide (LASIX) 80 MG tablet Take 80 mg by mouth daily.  Marland Kitchen ipratropium (ATROVENT) 0.03 % nasal spray Place 1-2 sprays into both nostrils daily as needed (for allergies.).  Marland Kitchen lidocaine-prilocaine (EMLA) cream Apply 1  application topically as needed.  . magnesium oxide (MAG-OX) 400 MG tablet Take 1,200 mg by mouth daily.   Marland Kitchen MELATONIN PO Take 30 mg by mouth at bedtime.  . meloxicam (MOBIC) 15 MG tablet Take 1/2 to 1 tablet Daily for Pain & Inflammation and try limit to 4 to 5 days /week to Avoid Kidney Damage  . Multiple Vitamins-Minerals (MULTIVITAMIN ADULT PO) Take by mouth daily.  . nitroGLYCERIN (NITROSTAT) 0.4 MG SL tablet Dissolve 1 tablet under tongue every 3 to 5 minutes if needed for Chest Pain  . omega-3 acid ethyl esters (LOVAZA) 1 g capsule Take 2 capsules (2 g total) by mouth 2 (two) times daily.  Marland Kitchen OVER THE COUNTER MEDICATION Taking OTC Iron 1 tablet daily.  Marland Kitchen OVER THE COUNTER MEDICATION OTC Nasacrom NS 1 spray each nostril 1 to 2 times daily.  . predniSONE (DELTASONE) 5 MG tablet   . prochlorperazine (COMPAZINE) 10 MG tablet Take 1 tablet (10 mg total) by mouth every 6 (six) hours as needed for nausea or vomiting.  . SUPER B COMPLEX/C PO Take by mouth daily.  . tamsulosin (FLOMAX) 0.4 MG CAPS capsule Take 1 capsule Daily for for Prostate  . tetrahydrozoline-zinc (VISINE-AC) 0.05-0.25 % ophthalmic solution Place 1-2 drops into both eyes 3 (three) times daily as needed (for redness/irritation.).  Marland Kitchen triamcinolone ointment (KENALOG) 0.1 % Apply 1 application topically 2 (two) times daily.  . Turmeric 500 MG TABS Take by mouth. Takes 3 in the morning and 3 in the evening  . valACYclovir (VALTREX) 1000 MG tablet Take 1 tablet   3 x  /day for Viral Blistering Rash  . Zinc 50 MG TABS Take by mouth daily.  Marland Kitchen terbinafine (LAMISIL) 250 MG tablet Take 1 tablet Daily for skin Ringworm / Fungus Infection (Patient not taking: Reported on 02/06/2020)  . triamcinolone (NASACORT) 55 MCG/ACT AERO nasal inhaler Place 2 sprays into the nose at bedtime.   No current facility-administered medications on file prior to visit.   Allergies  Allergen Reactions  . Codeine Itching and Other (See Comments)    Also  hallucinations   . Meloxicam Rash  . Morphine And Related Other (See Comments)    Hallucinations  . Nsaids Other (See Comments)    BLEEDING--naprosyn  . Oxycodone     Hallucinations  . Crestor [Rosuvastatin] Other (See Comments)    Soreness/aching  . Cymbalta [Duloxetine Hcl] Other (See Comments)    Pt is unsure of reaction type  . Dilaudid [Hydromorphone Hcl] Itching and Other (See Comments)    Hallucinations.  . Effexor [Venlafaxine] Other (See Comments)    Unsure of reactions type  . Gluten Meal Other (See Comments)    Runny nose (constant); bloating.  . Topamax [Topiramate] Other (See Comments)    Caused visual impairments/swelling in eyes--pinch off optic  nerve (temporary blindness)  . Tramadol Hcl   . Trazodone And Nefazodone Other (See Comments)    Unsure of reaction type  . Adhesive [Tape] Other (See Comments)    SKIN PEELS--PAPER TAPE IS OKAY  . Penicillins Rash  . Sulfa Antibiotics Swelling, Rash and Other (See Comments)    Mouth sores   PMHx:   Past Medical History:  Diagnosis Date  . Anginal pain ([place])   . Anxiety    treated /w Xanax for mild depression , using 2 times per day, not PRN  . Asthma   . Benign prostatic hypertrophy   . CAD (coronary artery disease)    pt last left heart cath was in Nov 2008. EF was 55% on left ventriculogram. Circumflex was totally occluded after the 1st obtuse marginal with collaterals supplying the distal circumflex. LAD showed luminal irregularities. The stent in LAD was patent. The RCA showed luminal irregularities. The stent in th emid RCA and PDA were patent. There were no inverventions.   . Chest pain    from anxiety last time 1 month ago  . 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 Chronic obstructive pulmonary disease (HCC)    asthma  . Cluster headache    relative to sinus problems none recnt  . Constipation   . Depression   . DM2 (diabetes mellitus, type 2) ([place])    well  with diet and exercise controlled  . GERD (gastroesophageal reflux disease)    hiatal hernia. Patient did have a Nissen fundoplication rare  . Heart murmur    heard 30 yrs ago  . History of blood transfusion    need for Bld. transfusion relative to taking NSAIDS  . HTN (hypertension)    pt. followed by [facility], last cardiac visit 2012, one yr. ago  . Hyperlipidemia   . Joint pain   . Lactose intolerance   . Left kidney mass   . Low back pain    chronic  . Metastatic non-small cell lung cancer (HCC)    adenocarcinoma, LUL, bone metastasis in 09/2018  . Neuromuscular disorder ([place])    nerve involvement in back & upper back relative to hardware in neck   . Obesity   . 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 Osteoarthritis   . Peptic ulcer disease   . Pneumonia not recent per pt  . 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 Sleep concern    states the study (2003 )was done at [facility]., it failed & he was told to return & he never did. Pt. states he has been found by prev. hosp. staff that he has to be told when to breathe & his wife does the same.   Marland Kitchen Spinal fracture    hx of traumatic in Jun 04, 2007 after falling off the roof  . Tinnitus, bilateral   . Vitamin D deficiency    Immunization History  Administered Date(s) Administered  . DT (Pediatric) 12/09/2014  . Influenza Split 02/17/2012  . Influenza, High Dose Seasonal PF 03/03/2014, 03/11/2015, 04/04/2016, 04/28/2017, 03/05/2018, 02/28/2019  . PFIZER SARS-COV-2 Vaccination 07/04/2019, 07/25/2019  . Pneumococcal Conjugate-13 12/25/2014  . Pneumococcal Polysaccharide-23 01/19/2012  . Pneumococcal-Unspecified 06/13/1997  . Td 06/13/2004  . Zoster 06/14/2007   Past Surgical History:  Procedure Laterality Date  . BACK SURGERY  x3- last surgery lumbar- 1998- / fusion   . blepheroplasty     both eyesx2  . C5-T6  posterior fusion     with [place] and radius screws  . [place], 2008 & 2012 multiple stents  total 4 times  . cataracts     cataracts removed- /w IOL - both eyes   . CERVICAL SPINE SURGERY    . COLONOSCOPY    . ESOPHAGEAL MANOMETRY    . HARDWARE REMOVAL  02/20/2012   Procedure: HARDWARE REMOVAL;  Surgeon: [name], MD;  Location: [facility];  Service: Neurosurgery;  Laterality: N/A;  Hardware Removal  . HIATAL HERNIA REPAIR  1979, spring 2009  . IR IMAGING GUIDED PORT INSERTION  10/29/2018  . LAPAROSCOPIC CHOLECYSTECTOMY     & IOC  . multiple percutaneous coronary interventions    . NASAL SINUS SURGERY     x2, sees Dr. [name], still having problems, states he uses Benadryl PRN- up to 5 times per day   . right temporal artery biopsy    . right wrist plate insertion  54/0086  . ROBOTIC ASSITED PARTIAL NEPHRECTOMY Left 03/02/2018   Procedure: XI ROBOTIC ASSITED LEFT PARTIAL NEPHRECTOMY WITH LYSIS OF ADHESIONS X30 MINUTES.;  Surgeon: Ardis Hughs, MD;  Location: [facility];  Service: Urology;  Laterality: Left;  CLAMP TIME FOR KIDNEY 1053-1110 TOTAL 18MINUTES  . Rotator cuff surgery Right   . SKIN BIOPSY Right 10/31/2017   Chest  . TRANSURETHRAL RESECTION OF BLADDER TUMOR N/A 11/03/2017   Procedure: cystoscopy;  Surgeon: Kathie Rhodes, MD;  Location: [facility];  Service: Urology;  Laterality: N/A;  . UMBILICAL HERNIA REPAIR    . UPPER GI ENDOSCOPY    . VENTRAL HERNIA REPAIR N/A 03/02/2018   Procedure: LAPAROSCOPIC REPAIR OF INCARCERATED INCISIONAL HERNIA WITH LYSIS OF ADHESIONS;  Surgeon: Michael Boston, MD;  Location: [facility];  Service: General;  Laterality: N/A;   FHx:    Reviewed / unchanged  SHx:    Reviewed / unchanged  Systems Review:  Constitutional: Denies fever, chills, wt changes, headaches, insomnia, fatigue, night sweats, change in appetite. Eyes: Denies redness, blurred vision, diplopia, discharge, itchy, watery eyes.  ENT: Denies discharge,  congestion, post nasal drip, epistaxis, sore throat, earache, hearing loss, dental pain, tinnitus, vertigo, sinus pain, snoring.  CV: Denies chest pain, palpitations, irregular heartbeat, syncope, dyspnea, diaphoresis, orthopnea, PND, claudication or edema. Respiratory: denies cough, dyspnea, DOE, pleurisy, hoarseness, laryngitis, wheezing.  Gastrointestinal: Denies dysphagia, odynophagia, heartburn, reflux, water brash, abdominal pain or cramps, nausea, vomiting, bloating, diarrhea, constipation, hematemesis, melena, hematochezia  or hemorrhoids. Genitourinary: Denies dysuria, frequency, urgency, nocturia, hesitancy, discharge, hematuria or flank pain. Musculoskeletal: Denies arthralgias, myalgias, stiffness, jt. swelling, pain, limping or strain/sprain.  Skin: Denies pruritus, rash, hives, warts, acne, eczema or change in skin lesion(s). Neuro: No weakness, tremor, incoordination, spasms, paresthesia or pain. Psychiatric: Denies confusion, memory loss or sensory loss. Endo: Denies change in weight, skin or hair change.  Heme/Lymph: No excessive bleeding, bruising or enlarged lymph nodes.  Physical Exam  BP 110/72   Pulse (!) 104   Temp (!) 97 F (36.1 C)   Resp 18   Ht 5' 7"  (1.702 m)   Wt 203 lb 9.6 oz (92.4 kg)   SpO2 95%   BMI 31.89 kg/m   Appears well nourished, well groomed  and in no distress.  Eyes: PERRLA, EOMs, conjunctiva no swelling or erythema. Sinuses: No frontal/maxillary tenderness ENT/Mouth: EAC's clear, TM's  nl w/o erythema, bulging. Nares clear w/o erythema, swelling, exudates. Oropharynx clear without erythema or exudates. Oral hygiene is good. Tongue normal, non obstructing. Hearing intact.  Neck: Supple. Thyroid nl. Car 2+/2+ without bruits, nodes or JVD. Chest: Respirations nl with BS clear & equal w/o rales, rhonchi, wheezing or stridor.  Cor: Heart sounds normal w/ regular rate and rhythm without sig. murmurs, gallops, clicks or rubs. Peripheral pulses normal  and equal  without edema.  Abdomen: Soft & bowel sounds normal. Non-tender w/o guarding, rebound, hernias, masses or organomegaly.  Lymphatics: Unremarkable.  Musculoskeletal: Full ROM all peripheral extremities, joint stability, 5/5 strength and normal gait.  Skin: Warm, dry without exposed rashes, lesions or ecchymosis apparent.  Neuro: Cranial nerves intact, reflexes equal bilaterally. Sensory-motor testing grossly intact. Tendon reflexes grossly intact.  Pysch: Alert & oriented x 3.  Insight and judgement nl & appropriate. No ideations.  Assessment and Plan:  1. Chest pain, unspecified type  - CBC with Differential/Platelet - COMPLETE METABOLIC PANEL WITH GFR  2. Adenocarcinoma, lung, left ([place])  - CBC with Differential/Platelet - COMPLETE METABOLIC PANEL WITH GFR  3. Bone metastasis ([place])  - CBC with Differential/Platelet - COMPLETE METABOLIC PANEL WITH GFR  4. Shortness of breath  - CBC with Differential/Platelet - COMPLETE METABOLIC PANEL WITH GFR  5. Peripheral edema  - CBC with Differential/Platelet - COMPLETE METABOLIC PANEL WITH GFR  6. Chemotherapy-induced neutropenia (HCC)  - CBC with Differential/Platelet - COMPLETE METABOLIC PANEL WITH GFR  7. Hx of pulmonary embolus  - CBC with Differential/Platelet - COMPLETE METABOLIC PANEL WITH GFR  8. Medication management  - CBC with Differential/Platelet - COMPLETE METABOLIC PANEL WITH GFR        Discussed  regular exercise, BP monitoring, weight control to achieve/maintain BMI less than 25 and discussed meds and SE's. Recommended labs to assess and monitor clinical status with further disposition pending results of labs. Over 30 minutes of exam, counseling, chart review was performed.

## 2020-02-07 DIAGNOSIS — R918 Other nonspecific abnormal finding of lung field: Secondary | ICD-10-CM | POA: Diagnosis not present

## 2020-02-07 DIAGNOSIS — C7952 Secondary malignant neoplasm of bone marrow: Secondary | ICD-10-CM | POA: Diagnosis not present

## 2020-02-07 DIAGNOSIS — Z923 Personal history of irradiation: Secondary | ICD-10-CM | POA: Diagnosis not present

## 2020-02-07 DIAGNOSIS — C3412 Malignant neoplasm of upper lobe, left bronchus or lung: Secondary | ICD-10-CM | POA: Diagnosis not present

## 2020-02-07 DIAGNOSIS — C7951 Secondary malignant neoplasm of bone: Secondary | ICD-10-CM | POA: Diagnosis not present

## 2020-02-07 DIAGNOSIS — R590 Localized enlarged lymph nodes: Secondary | ICD-10-CM | POA: Diagnosis not present

## 2020-02-07 LAB — CBC WITH DIFFERENTIAL/PLATELET
Absolute Monocytes: 475 cells/uL (ref 200–950)
Basophils Absolute: 26 cells/uL (ref 0–200)
Basophils Relative: 0.3 %
Eosinophils Absolute: 0 cells/uL — ABNORMAL LOW (ref 15–500)
Eosinophils Relative: 0 %
HCT: 35.8 % — ABNORMAL LOW (ref 38.5–50.0)
Hemoglobin: 12.2 g/dL — ABNORMAL LOW (ref 13.2–17.1)
Lymphs Abs: 704 cells/uL — ABNORMAL LOW (ref 850–3900)
MCH: 31.8 pg (ref 27.0–33.0)
MCHC: 34.1 g/dL (ref 32.0–36.0)
MCV: 93.2 fL (ref 80.0–100.0)
MPV: 9.9 fL (ref 7.5–12.5)
Monocytes Relative: 5.4 %
Neutro Abs: 7594 cells/uL (ref 1500–7800)
Neutrophils Relative %: 86.3 %
Platelets: 133 10*3/uL — ABNORMAL LOW (ref 140–400)
RBC: 3.84 10*6/uL — ABNORMAL LOW (ref 4.20–5.80)
RDW: 15.7 % — ABNORMAL HIGH (ref 11.0–15.0)
Total Lymphocyte: 8 %
WBC: 8.8 10*3/uL (ref 3.8–10.8)

## 2020-02-07 LAB — COMPLETE METABOLIC PANEL WITH GFR
AG Ratio: 1.3 (calc) (ref 1.0–2.5)
ALT: 17 U/L (ref 9–46)
AST: 22 U/L (ref 10–35)
Albumin: 3.2 g/dL — ABNORMAL LOW (ref 3.6–5.1)
Alkaline phosphatase (APISO): 75 U/L (ref 35–144)
BUN/Creatinine Ratio: 43 (calc) — ABNORMAL HIGH (ref 6–22)
BUN: 58 mg/dL — ABNORMAL HIGH (ref 7–25)
CO2: 25 mmol/L (ref 20–32)
Calcium: 9.1 mg/dL (ref 8.6–10.3)
Chloride: 95 mmol/L — ABNORMAL LOW (ref 98–110)
Creat: 1.35 mg/dL — ABNORMAL HIGH (ref 0.70–1.18)
GFR, Est African American: 59 mL/min/{1.73_m2} — ABNORMAL LOW (ref >=60)
GFR, Est Non African American: 51 mL/min/{1.73_m2} — ABNORMAL LOW (ref >=60)
Globulin: 2.5 g/dL (calc) (ref 1.9–3.7)
Glucose, Bld: 137 mg/dL — ABNORMAL HIGH (ref 65–99)
Potassium: 4.5 mmol/L (ref 3.5–5.3)
Sodium: 131 mmol/L — ABNORMAL LOW (ref 135–146)
Total Bilirubin: 0.3 mg/dL (ref 0.2–1.2)
Total Protein: 5.7 g/dL — ABNORMAL LOW (ref 6.1–8.1)

## 2020-02-07 NOTE — Progress Notes (Signed)
==================================================== -   Test results slightly outside the reference range are not unusual. If there is anything important, I will review this with you,  otherwise it is considered normal test values.  If you have further questions,  please do not hesitate to contact me at the office or via My Chart.  ====================================================  -  WBC & Neutrophils are Normal   - Hgb / Red cell count is done slightly from 14.2 to now 12. 2 gm%   and should be rechecked next week   - either here or at [facility]  ====================================================  -  CMET shows kidney functions stable, but appears that you need  to drink more water   - Electrolytes - OK  - Alkaline Phosphatase is a Liver & Bone enzyme and   is back to Normal - Great ! ====================================================

## 2020-02-08 ENCOUNTER — Encounter: Payer: Self-pay | Admitting: Internal Medicine

## 2020-02-11 DIAGNOSIS — Z923 Personal history of irradiation: Secondary | ICD-10-CM | POA: Diagnosis not present

## 2020-02-11 DIAGNOSIS — C7951 Secondary malignant neoplasm of bone: Secondary | ICD-10-CM | POA: Diagnosis not present

## 2020-02-11 DIAGNOSIS — Z905 Acquired absence of kidney: Secondary | ICD-10-CM | POA: Diagnosis not present

## 2020-02-11 DIAGNOSIS — R600 Localized edema: Secondary | ICD-10-CM | POA: Diagnosis not present

## 2020-02-11 DIAGNOSIS — Z8051 Family history of malignant neoplasm of kidney: Secondary | ICD-10-CM | POA: Diagnosis not present

## 2020-02-11 DIAGNOSIS — G893 Neoplasm related pain (acute) (chronic): Secondary | ICD-10-CM | POA: Diagnosis not present

## 2020-02-11 DIAGNOSIS — C3412 Malignant neoplasm of upper lobe, left bronchus or lung: Secondary | ICD-10-CM | POA: Diagnosis not present

## 2020-02-11 DIAGNOSIS — C3492 Malignant neoplasm of unspecified part of left bronchus or lung: Secondary | ICD-10-CM | POA: Diagnosis not present

## 2020-02-19 DIAGNOSIS — Z23 Encounter for immunization: Secondary | ICD-10-CM | POA: Diagnosis not present

## 2020-02-25 DIAGNOSIS — C3492 Malignant neoplasm of unspecified part of left bronchus or lung: Secondary | ICD-10-CM | POA: Diagnosis not present

## 2020-02-25 DIAGNOSIS — Z9221 Personal history of antineoplastic chemotherapy: Secondary | ICD-10-CM | POA: Diagnosis not present

## 2020-02-25 DIAGNOSIS — I10 Essential (primary) hypertension: Secondary | ICD-10-CM | POA: Diagnosis not present

## 2020-02-25 DIAGNOSIS — C7951 Secondary malignant neoplasm of bone: Secondary | ICD-10-CM | POA: Diagnosis not present

## 2020-02-25 DIAGNOSIS — Z923 Personal history of irradiation: Secondary | ICD-10-CM | POA: Diagnosis not present

## 2020-02-25 DIAGNOSIS — Z79899 Other long term (current) drug therapy: Secondary | ICD-10-CM | POA: Diagnosis not present

## 2020-02-25 DIAGNOSIS — R600 Localized edema: Secondary | ICD-10-CM | POA: Diagnosis not present

## 2020-02-25 DIAGNOSIS — C3412 Malignant neoplasm of upper lobe, left bronchus or lung: Secondary | ICD-10-CM | POA: Diagnosis not present

## 2020-02-25 DIAGNOSIS — E785 Hyperlipidemia, unspecified: Secondary | ICD-10-CM | POA: Diagnosis not present

## 2020-02-25 DIAGNOSIS — G893 Neoplasm related pain (acute) (chronic): Secondary | ICD-10-CM | POA: Diagnosis not present

## 2020-03-02 NOTE — Progress Notes (Signed)
MEDICARE ANNUAL WELLNESS VISIT AND FOLLOW UP Assessment:   Medicare annual wellness visit Verify flu vaccine status - if hasn't had, due in Oct  Atherosclerosis of aorta Control blood pressure, cholesterol, glucose, increase exercise.   Essential hypertension - continue medications, DASH diet, exercise and monitor at home. Call if greater than 140/90.  -     CBC with Differential/Platelet -     CMP/GFR -     TSH  Atherosclerosis of coronary artery bypass graft of native heart without angina pectoris Dr. Meda Coffee following Control blood pressure, cholesterol, glucose, increase exercise.   COPD mixed type ([place]) Continue inhalers, avoid smoker/irritants, exercise encouraged  OSA and COPD overlap syndrome ([place]) ? Resolved, weight loss advised, continue to monitor Established with Dr. Brett Fairy  Rhinitis, nonallergic, chronic - Allegra OTC, increase H20, allergy hygiene explained.  PUD Continue PPI/H2 blocker, diet discussed  Gastroesophageal reflux disease, esophagitis presence not specified Continue PPI/H2 blocker, diet discussed  Other abnormal glucose Recent A1Cs at goal Discussed diet/exercise, weight management  Defer A1C; monitor weight, getting frequent CMPs via oncology, defer today; monitor for serum glucose  Chronic cluster headache, not intractable Monitor, avoid triggers  BPH/Prostatism monitor  Hyperlipidemia/ statin myopathy - off of all meds in light of chemo; fairly controlled with lifestyle  - check lipids, decrease fatty foods, increase activity.  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 Elnoria Howard [facility]  Pulmonary Embolism In 12/2018 in setting of lung CA, on elequis, plan to continue Continue indefinitely due to cancer as risk factor, otherwise unprovoked  Anxiety/depressioninsomnia Newly prescribed sertraline and ritalin by palliative, continue low dose valium for sleep, failed numerous agents good sleep hygiene discussed, increase day time activity  Peripheral edema Had recent ECHO in 11/2019 without CHF Very sedentary, abdominal obesity- elevate legs TID, increase activity, increase water, decrease sodium intake.  Wear compression socks daily.Return to the office if no change with symptoms. Check labs.   Over 30 minutes of exam, counseling, chart review, and critical decision making was performed  Future Appointments  Date Time Provider [place]  06/09/2020  9:00 AM [name], MD [facility] None  03/05/2021 10:00 AM Liane Comber, NP [facility] None     Plan:   During the course of the visit the patient was educated and counseled about appropriate screening and preventive services including:    Pneumococcal vaccine   Influenza vaccine  Prevnar 13  Td vaccine  Screening electrocardiogram  Colorectal cancer screening  Diabetes screening  Glaucoma screening  Nutrition counseling    Subjective:  [name] is a 74 y.o. male who presents for Medicare Annual Wellness Visit and 3 month follow up for HTN, hyperlipidemia, prediabetes, and vitamin D Def. Follows with Dr. Assunta Curtis for injections for lower back arthritis.   He is followed by Dr. Karsten Ro for left renal mass -s/p  robotic partial nephrectomy in 02/2018.  In April  2020 he was diagnosed with LUL Adenocarcinoma of Lung with mets to bone and no longer following with,  Dr Julien Nordmann, now follows with Dr. Clearnce Sorrel at [facility], s/p 20 cycles of radiation, started chemo with severe reaction first course 5 weeks ago, has follow up next week for alternative agent. Recent CT 02/07/2020 showed Slight interval decrease of the spiculated left upper lobe nodule status post radiation therapy, Slight interval increase in size of the metastatic lesions to the bilateral ilium, Enlarging left AP window lymph node, now measuring 9 mm in diameter.   He was recommended to start ritalin 5 mg daily for energy via palliative provider at [facility].   He was incidentally found to have a PE RUL middle artery in 12/2018, continues with elequis 5 mg BID.   Has COPD, on Symbicort, rare albuterol, works well.  Has history of OSA, NOT on CPAP, but no OSA per neuro.   He is following with Dr. Mayer Camel for R shoulder pain; has been recommended total replacement fur deferring for now, has improved, and takes tylenol.   Has insomnia r/t pain following radiation, Taking 2.5 mg valium, will get up and take tylenol if needed which works well. Has tried trazodone, gabapentin, numerous others in the past and hasn't tolerated.  BMI is Body mass index is 33.17 kg/m., he has been working on diet, trying to cut down on portions, eating small portions every 3 hours to manage nausea; exercise is limited due to fatigue following chemotherapy, newly prescribed ritalin 5 mg and sertraline 25 mg via palliative Wt Readings from Last 3 Encounters:  03/04/20 211 lb 12.8 oz (96.1 kg)  02/06/20 203 lb 9.6 oz (92.4 kg)  12/03/19 210 lb 6.4 oz (95.4 kg)   In 1992 , he had PCA/Stents locally by Dr Wynonia Lawman followed at [facility] for Laser angioplasty. Heart cath in 2012 found patent Stents. Dr. Meda Coffee following  Aortic therosclerosis per CT 08/2019 Had ECHO 11/2019, has had ankle edema, showed EF 55-60%, mild LVH, taking lasix 80 mg  daily, recently not helping with edema.  He admits has not been checking BPs at home, does have a cuff, today their BP is BP: 108/82 He does not workout. He denies chest pain, shortness of breath, dizziness.    He is not on cholesterol medication due to hx of reacting very poorly to multiple agents and was hospitalized in the past; also recent cheno.  His cholesterol is not at goal. The cholesterol last visit was:   Lab Results  Component Value Date   CHOL 179 12/03/2019   HDL 65 12/03/2019   LDLCALC 87 12/03/2019   LDLDIRECT 116.0 06/03/2013   TRIG 170 (H) 12/03/2019   CHOLHDL 2.8 12/03/2019    He was in DM range several years ago, but with diet is back to normal range.  Lab Results  Component Value Date   HGBA1C 5.5 12/03/2019   Last GFR Lab Results  Component Value Date   GFRNONAA 51 (L) 02/06/2020    Patient is on Vitamin D supplement, has reduced from 10000 IU to alternating 5000/10000 IU Lab Results  Component Value Date   VD25OH 113 (H) 12/03/2019    He is getting CBC, CMP rechecked requently via cancer center   Lab Results  Component Value Date   WBC 8.8 02/06/2020   HGB 12.2 (L) 02/06/2020   HCT 35.8 (L) 02/06/2020   MCV 93.2 02/06/2020   PLT 133 (L) 02/06/2020  Medication Review: Current Outpatient Medications on File Prior to Visit  Medication Sig Dispense Refill  . acetaminophen (TYLENOL) 500 MG tablet Take 1 tablet (500 mg total) by mouth every 6 (six) hours as needed. 30 tablet 0  . albuterol (VENTOLIN HFA) 108 (90 Base) MCG/ACT inhaler Inhale 2 puffs into the lungs every 4 (four) hours as needed for wheezing or shortness of breath. 18 g 1  . Alpha-Lipoic Acid 200 MG CAPS Take by mouth 2 (two) times daily.    . Ascorbic Acid (VITAMIN C) 1000 MG tablet Take 1,000 mg by mouth 2 (two) times daily.    . Blood Glucose Monitoring Suppl (FREESTYLE FREEDOM LITE) w/Device KIT Check blood sugar 1 time a day. [id number] 1 each 0  . budesonide-formoterol  (SYMBICORT) 160-4.5 MCG/ACT inhaler Use twice daily, wash your mouth afterwards to avoid yeast. 1 Inhaler 12  . Cholecalciferol (VITAMIN D3) 5000 units CAPS Take 5,000 Units by mouth See admin instructions. Take 10 caps weekly     . CHOLINE PO Take 600 mg by mouth daily.    . clotrimazole-betamethasone (LOTRISONE) cream Apply to blistering Rash & Groin Rash 3 x / day 45 g 3  . diazepam (VALIUM) 5 MG tablet Take 1/2 to 1 tablet 2 to 3 x /day as needed for Anxiety 270 tablet 0  . DOXYLAMINE SUCCINATE PO Take 0.5 tablets by mouth at bedtime.     Marland Kitchen ELIQUIS 5 MG TABS tablet Take 5 mg by mouth 2 (two) times daily.    . folic acid (FOLVITE) 1 MG tablet Take 1 tablet (1 mg total) by mouth daily. 30 tablet 4  . furosemide (LASIX) 80 MG tablet Take 80 mg by mouth daily.    Marland Kitchen ipratropium (ATROVENT) 0.03 % nasal spray Place 1-2 sprays into both nostrils daily as needed (for allergies.). 30 mL 3  . lidocaine-prilocaine (EMLA) cream Apply 1 application topically as needed. 30 g 0  . magnesium oxide (MAG-OX) 400 MG tablet Take 1,200 mg by mouth daily.     . meloxicam (MOBIC) 15 MG tablet Take 1/2 to 1 tablet Daily for Pain & Inflammation and try limit to 4 to 5 days /week to Avoid Kidney Damage 90 tablet 0  . methylphenidate (RITALIN) 5 MG tablet Take 5 mg by mouth daily.    . Multiple Vitamins-Minerals (MULTIVITAMIN ADULT PO) Take by mouth daily.    . nitroGLYCERIN (NITROSTAT) 0.4 MG SL tablet Dissolve 1 tablet under tongue every 3 to 5 minutes if needed for Chest Pain 50 tablet 3  . omega-3 acid ethyl esters (LOVAZA) 1 g capsule Take 2 capsules (2 g total) by mouth 2 (two) times daily. 360 capsule 3  . OVER THE COUNTER MEDICATION Taking OTC Iron 1 tablet daily.    Marland Kitchen OVER THE COUNTER MEDICATION OTC Nasacrom NS 1 spray each nostril 1 to 2 times daily.    . prochlorperazine (COMPAZINE) 10 MG tablet Take 1 tablet (10 mg total) by mouth every 6 (six) hours as needed for nausea or vomiting. 30 tablet 0  .  sertraline (ZOLOFT) 25 MG tablet Take 25 mg by mouth daily.    . SUPER B COMPLEX/C PO Take by mouth daily.    . tamsulosin (FLOMAX) 0.4 MG CAPS capsule Take 1 capsule Daily for for Prostate 90 capsule 0  . tetrahydrozoline-zinc (VISINE-AC) 0.05-0.25 % ophthalmic solution Place 1-2 drops into both eyes 3 (three) times daily as needed (for redness/irritation.).    Marland Kitchen triamcinolone ointment (KENALOG) 0.1 % Apply  1 application topically 2 (two) times daily. 80 g 1  . Zinc 50 MG TABS Take by mouth daily.    Marland Kitchen triamcinolone (NASACORT) 55 MCG/ACT AERO nasal inhaler Place 2 sprays into the nose at bedtime. 1 Inhaler 3   No current facility-administered medications on file prior to visit.    Allergies: Allergies  Allergen Reactions  . Codeine Itching and Other (See Comments)    Also hallucinations   . Meloxicam Rash  . Morphine And Related Other (See Comments)    Hallucinations  . Nsaids Other (See Comments)    BLEEDING--naprosyn  . Oxycodone     Hallucinations  . Crestor [Rosuvastatin] Other (See Comments)    Soreness/aching  . Cymbalta [Duloxetine Hcl] Other (See Comments)    Pt is unsure of reaction type  . Dilaudid [Hydromorphone Hcl] Itching and Other (See Comments)    Hallucinations.  . Effexor [Venlafaxine] Other (See Comments)    Unsure of reactions type  . Gluten Meal Other (See Comments)    Runny nose (constant); bloating.  . Topamax [Topiramate] Other (See Comments)    Caused visual impairments/swelling in eyes--pinch off optic nerve (temporary blindness)  . Tramadol Hcl   . Trazodone And Nefazodone Other (See Comments)    Unsure of reaction type  . Adhesive [Tape] Other (See Comments)    SKIN PEELS--PAPER TAPE IS OKAY  . Penicillins Rash  . Sulfa Antibiotics Swelling, Rash and Other (See Comments)    Mouth sores    Current Problems (verified) has Hyperlipidemia; Cluster headache syndrome; Essential hypertension; Coronary atherosclerosis; COPD mixed type ([place]); GERD;  Lumbago, Chronic; PUD; BPH/Prostatism; Vitamin D deficiency; Medication management; Rhinitis, nonallergic, chronic; Obesity (BMI 30.0-34.9); OSA and COPD overlap syndrome ([place]); Aortic atherosclerosis ([place]); Left renal mass s/p robotic partial nephrectomy 03/02/2018; Recurrent incisional hernia with incarceration s/p lap repair w mesh 03/02/2018; Chronic kidney disease (CKD), stage III (moderate); Renal cell carcinoma, left ([place]); Adenocarcinoma of left lung, stage 4 ([place]) ; Goals of care, counseling/discussion; Encounter for antineoplastic chemotherapy; Encounter for antineoplastic immunotherapy; Port-A-Cath in place; History of pulmonary embolus (PE) - 12/2018; Statin myopathy; Secondary malignant neoplasm of bone ([place]); and Current mild episode of major depressive disorder without prior episode ([place]) on their problem list.  Screening Tests Immunization History  Administered Date(s) Administered  . DT (Pediatric) 12/09/2014  . Influenza Split 02/17/2012  . Influenza, High Dose Seasonal PF 03/03/2014, 03/11/2015, 04/04/2016, 04/28/2017, 03/05/2018, 02/28/2019  . PFIZER SARS-COV-2 Vaccination 07/04/2019, 07/25/2019, 02/19/2020  . Pneumococcal Conjugate-13 12/25/2014  . Pneumococcal Polysaccharide-23 01/19/2012  . Pneumococcal-Unspecified 06/13/1997  . Td 06/13/2004  . Zoster 06/14/2007   Preventative care: Last colonoscopy: 03/2016, due 2022  Prior vaccinations: TD or Tdap: 6/16 Influenza: 02/2019, believes had at [facility], will confirm Pneumococcal: 8/13 Prevnar13: 2016 Shingles/Zostavax: 2009 Covid 19: 2/2, 2021, pfizer, reports did have booster in Sept 2021   Names of Other Physician/Practitioners you currently use: 1. [facility] here for primary care 2. Dr. Katy Fitch, eye doctor, last visit 03/27/2019 abstracted - due, has scheduled 3. 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, community, last visit 2021, q41m  Patient Care Team: [name], MD as PCP - General (Internal  Medicine) Dorothy Spark, MD as PCP - Cardiology (Cardiology) Kathie Rhodes, MD as Consulting Physician (Urology) Warden Fillers, MD as Consulting Physician (Optometry) Larey Dresser, MD as Consulting Physician (Cardiology) Laurence Spates, MD (Inactive) as Consulting Physician (Gastroenterology) Deneise Lever, MD as Consulting Physician (Pulmonary Disease) Dorothy Spark, MD as Consulting Physician (Cardiology) Michael Boston,  MD as Consulting Physician (General Surgery) Ardis Hughs, MD as Attending Physician (Urology) Laurence Spates, MD (Inactive) as Consulting Physician (Gastroenterology) Frederik Pear, MD as Consulting Physician (Orthopedic Surgery)  Surgical: He  has a past surgical history that includes Hiatal hernia repair (1979, spring 2009); right temporal artery biopsy; C5-T6 posterior fusion; Esophageal manometry; Laparoscopic cholecystectomy; Umbilical hernia repair; multiple percutaneous coronary interventions; cataracts; blepheroplasty; Nasal sinus surgery; Hardware Removal (02/20/2012); Rotator cuff surgery (Right); Cardiac catheterization; Cervical spine surgery; Colonoscopy; Upper gi endoscopy; Skin biopsy (Right, 10/31/2017); Transurethral resection of bladder tumor (N/A, 11/03/2017); Back surgery; right wrist plate insertion (16/3846); Ventral hernia repair (N/A, 03/02/2018); Robotic assited partial nephrectomy (Left, 03/02/2018); and IR IMAGING GUIDED PORT INSERTION (10/29/2018). Family His family history includes Coronary artery disease in his father; Gout in his sister; Heart attack in his brother, brother, father, and mother; Heart failure in his mother; Hypertension in his father and mother; Obesity in his mother. Social history  He reports that he quit smoking about 42 years ago. His smoking use included cigarettes. He has a 20.00 pack-year smoking history. He has never used smokeless tobacco. He reports previous alcohol use. He reports that he does not use  drugs.  MEDICARE WELLNESS OBJECTIVES: Physical activity: Exercise limited by: Other - see comments Cardiac risk factors: Cardiac Risk Factors include: advanced age (>74mn, >>33women);dyslipidemia;hypertension;obesity (BMI >30kg/m2);male gender;sedentary lifestyle;smoking/ tobacco exposure Depression/mood screen:   [facility] 03/04/2020  Decreased Interest 0  Down, Depressed, Hopeless 1  PHQ - 2 Score 1  Altered sleeping 3  Tired, decreased energy 3  Change in appetite 1  Feeling bad or failure about yourself  0  Trouble concentrating 1  Moving slowly or fidgety/restless 0  Suicidal thoughts 0  PHQ-9 Score 9  Difficult doing work/chores Somewhat difficult  Some recent data might be hidden    ADLs:  In your present state of health, do you have any difficulty performing the following activities: 03/04/2020 02/08/2020  Hearing? N N  Vision? N N  Difficulty concentrating or making decisions? N N  Walking or climbing stairs? Y N  Comment severe fatigue r/t chemo -  Dressing or bathing? N N  Doing errands, shopping? N N  Some recent data might be hidden     Cognitive Testing  Alert? Yes  Normal Appearance?Yes  Oriented to person? Yes  Place? Yes   Time? Yes  Recall of three objects?  Yes  Can perform simple calculations? Yes  Displays appropriate judgment?Yes  Can read the correct time from a watch face?Yes  EOL planning: Does Patient Have a Medical Advance Directive?: Yes Type of Advance Directive: Healthcare Power of Attorney, Living will Does patient want to make changes to medical advance directive?: No - Patient declined Copy of HReadlynin Chart?: Yes - validated most recent copy scanned in chart (See row information)   Objective:   Today's Vitals   03/04/20 1123  BP: 108/82  Pulse: (!) 101  Temp: (!) 97.3 F (36.3 C)  SpO2: 95%  Weight: 211 lb 12.8 oz (96.1 kg)  Height: _0  (1.702 m)   Body mass index is 33.17  kg/m.  General appearance: alert, no distress, WD/WN, male HEENT: normocephalic, sclerae anicteric, TMs pearly, nares patent, no discharge or erythema, pharynx normal Oral cavity: MMM, no lesions Neck: supple, no lymphadenopathy, no thyromegaly, no masses Heart: RRR, normal S1, S2, no murmurs Lungs: CTA bilaterally, no wheezes, rhonchi, or rales Abdomen: +bs, soft, obese abdomen limiting exam,  non tender, non distended, no masses, no hepatomegaly, no splenomegaly Musculoskeletal: nontender, no swelling, no obvious deformity Extremities:  no cyanosis, no clubbing, does have mild bil pitting ankle edema Pulses: 2+ symmetric, upper and lower extremities, normal cap refill Neurological: alert, oriented x 3, CN2-12 intact, strength normal upper extremities and lower extremities, sensation normal throughout, DTRs 2+ throughout, no cerebellar signs, gait slow steady with cane Psychiatric: normal affect, behavior normal, pleasant   Medicare Attestation I have personally reviewed: The patient's medical and social history Their use of alcohol, tobacco or illicit drugs Their current medications and supplements The patient's functional ability including ADLs,fall risks, home safety risks, cognitive, and hearing and visual impairment Diet and physical activities Evidence for depression or mood disorders  The patient's weight, height, BMI, and visual acuity have been recorded in the chart.  I have made referrals, counseling, and provided education to the patient based on review of the above and I have provided the patient with a written personalized care plan for preventive services.     Izora Ribas, NP   03/04/2020

## 2020-03-03 DIAGNOSIS — T402X5A Adverse effect of other opioids, initial encounter: Secondary | ICD-10-CM | POA: Diagnosis not present

## 2020-03-03 DIAGNOSIS — T402X5D Adverse effect of other opioids, subsequent encounter: Secondary | ICD-10-CM | POA: Diagnosis not present

## 2020-03-03 DIAGNOSIS — C7951 Secondary malignant neoplasm of bone: Secondary | ICD-10-CM | POA: Diagnosis not present

## 2020-03-03 DIAGNOSIS — C3412 Malignant neoplasm of upper lobe, left bronchus or lung: Secondary | ICD-10-CM | POA: Diagnosis not present

## 2020-03-03 DIAGNOSIS — F329 Major depressive disorder, single episode, unspecified: Secondary | ICD-10-CM | POA: Diagnosis not present

## 2020-03-03 DIAGNOSIS — Z515 Encounter for palliative care: Secondary | ICD-10-CM | POA: Diagnosis not present

## 2020-03-03 DIAGNOSIS — Z7189 Other specified counseling: Secondary | ICD-10-CM | POA: Diagnosis not present

## 2020-03-03 DIAGNOSIS — K5903 Drug induced constipation: Secondary | ICD-10-CM | POA: Diagnosis not present

## 2020-03-03 DIAGNOSIS — G893 Neoplasm related pain (acute) (chronic): Secondary | ICD-10-CM | POA: Diagnosis not present

## 2020-03-03 DIAGNOSIS — R630 Anorexia: Secondary | ICD-10-CM | POA: Diagnosis not present

## 2020-03-03 DIAGNOSIS — R4584 Anhedonia: Secondary | ICD-10-CM | POA: Diagnosis not present

## 2020-03-03 DIAGNOSIS — C7800 Secondary malignant neoplasm of unspecified lung: Secondary | ICD-10-CM | POA: Diagnosis not present

## 2020-03-04 ENCOUNTER — Encounter: Payer: Self-pay | Admitting: Adult Health

## 2020-03-04 ENCOUNTER — Other Ambulatory Visit: Payer: Self-pay

## 2020-03-04 ENCOUNTER — Ambulatory Visit (INDEPENDENT_AMBULATORY_CARE_PROVIDER_SITE_OTHER): Payer: Medicare Other | Admitting: Adult Health

## 2020-03-04 VITALS — BP 108/82 | HR 101 | Temp 97.3°F | Ht 67.0 in | Wt 211.8 lb

## 2020-03-04 DIAGNOSIS — C7951 Secondary malignant neoplasm of bone: Secondary | ICD-10-CM | POA: Diagnosis not present

## 2020-03-04 DIAGNOSIS — Z0001 Encounter for general adult medical examination with abnormal findings: Secondary | ICD-10-CM | POA: Diagnosis not present

## 2020-03-04 DIAGNOSIS — G4733 Obstructive sleep apnea (adult) (pediatric): Secondary | ICD-10-CM | POA: Diagnosis not present

## 2020-03-04 DIAGNOSIS — G44029 Chronic cluster headache, not intractable: Secondary | ICD-10-CM

## 2020-03-04 DIAGNOSIS — R609 Edema, unspecified: Secondary | ICD-10-CM

## 2020-03-04 DIAGNOSIS — E785 Hyperlipidemia, unspecified: Secondary | ICD-10-CM | POA: Diagnosis not present

## 2020-03-04 DIAGNOSIS — I700 Atherosclerosis of aorta: Secondary | ICD-10-CM

## 2020-03-04 DIAGNOSIS — N2889 Other specified disorders of kidney and ureter: Secondary | ICD-10-CM | POA: Diagnosis not present

## 2020-03-04 DIAGNOSIS — G8929 Other chronic pain: Secondary | ICD-10-CM

## 2020-03-04 DIAGNOSIS — C642 Malignant neoplasm of left kidney, except renal pelvis: Secondary | ICD-10-CM | POA: Diagnosis not present

## 2020-03-04 DIAGNOSIS — K219 Gastro-esophageal reflux disease without esophagitis: Secondary | ICD-10-CM

## 2020-03-04 DIAGNOSIS — K279 Peptic ulcer, site unspecified, unspecified as acute or chronic, without hemorrhage or perforation: Secondary | ICD-10-CM

## 2020-03-04 DIAGNOSIS — E559 Vitamin D deficiency, unspecified: Secondary | ICD-10-CM | POA: Diagnosis not present

## 2020-03-04 DIAGNOSIS — N481 Balanitis: Secondary | ICD-10-CM

## 2020-03-04 DIAGNOSIS — J449 Chronic obstructive pulmonary disease, unspecified: Secondary | ICD-10-CM

## 2020-03-04 DIAGNOSIS — R6889 Other general symptoms and signs: Secondary | ICD-10-CM | POA: Diagnosis not present

## 2020-03-04 DIAGNOSIS — Z79899 Other long term (current) drug therapy: Secondary | ICD-10-CM | POA: Diagnosis not present

## 2020-03-04 DIAGNOSIS — I2510 Atherosclerotic heart disease of native coronary artery without angina pectoris: Secondary | ICD-10-CM | POA: Diagnosis not present

## 2020-03-04 DIAGNOSIS — N183 Chronic kidney disease, stage 3 unspecified: Secondary | ICD-10-CM

## 2020-03-04 DIAGNOSIS — F320 Major depressive disorder, single episode, mild: Secondary | ICD-10-CM | POA: Diagnosis not present

## 2020-03-04 DIAGNOSIS — M5441 Lumbago with sciatica, right side: Secondary | ICD-10-CM

## 2020-03-04 DIAGNOSIS — G720 Drug-induced myopathy: Secondary | ICD-10-CM

## 2020-03-04 DIAGNOSIS — I10 Essential (primary) hypertension: Secondary | ICD-10-CM

## 2020-03-04 DIAGNOSIS — Z95828 Presence of other vascular implants and grafts: Secondary | ICD-10-CM

## 2020-03-04 DIAGNOSIS — N320 Bladder-neck obstruction: Secondary | ICD-10-CM

## 2020-03-04 DIAGNOSIS — E669 Obesity, unspecified: Secondary | ICD-10-CM

## 2020-03-04 DIAGNOSIS — C3492 Malignant neoplasm of unspecified part of left bronchus or lung: Secondary | ICD-10-CM

## 2020-03-04 DIAGNOSIS — Z0000 Encounter for general adult medical examination without abnormal findings: Secondary | ICD-10-CM

## 2020-03-04 MED ORDER — VALACYCLOVIR HCL 1 G PO TABS
ORAL_TABLET | ORAL | 0 refills | Status: AC
Start: 2020-03-04 — End: ?

## 2020-03-04 NOTE — Patient Instructions (Addendum)
[name] , Thank you for taking time to come for your Medicare Wellness Visit. I appreciate your ongoing commitment to your health goals. Please review the following plan we discussed and let me know if I can assist you in the future.   These are the goals we discussed: Goals    . Exercise 20-30 min daily     Can break up in small sessions a few min at a time to begin with if needed and gradually increase  Get up at least hourly to walk     . LDL CALC < 70    . Weight (lb) < 180 lb (81.6 kg)       This is a list of the screening recommended for you and due dates:  Health Maintenance  Topic Date Due  . Flu Shot  03/13/2020*  . Urine Protein Check  06/02/2020  . Colon Cancer Screening  04/11/2021  . Tetanus Vaccine  12/08/2024  . COVID-19 Vaccine  Completed  .  Hepatitis C: One time screening is recommended by Center for Disease Control  (CDC) for  adults born from [birth year] through 1967.   Completed  . Pneumonia vaccines  Completed  *Topic was postponed. The date shown is not the original due date.         COMPRESSION STOCKINGS  HOME CARE INSTRUCTIONS   Do not stand or sit in one position for long periods of time. Do not sit with your legs crossed. Rest with your legs raised during the day.  Your legs have to be higher than your heart so that gravity will force the valves to open, so please really elevate your legs.   Wear elastic stockings or support hose. Do not wear other tight, encircling garments around the legs, pelvis, or waist.  [employer]  has a wide variety of well priced compression stockings. [address], [place] [id number] [id number] [place] has a good cheap selection, I like the socks, they are not as hard to get on  Walk as much as possible to increase blood flow.  Raise the foot of your bed at night with 2-inch blocks. SEEK MEDICAL CARE IF:   The skin around your ankle starts to break down.  You have pain, redness, tenderness,  or hard swelling developing in your leg over a vein.  You are uncomfortable due to leg pain. Document Released: 03/09/2005 Document Revised: 08/22/2011 Document Reviewed: 07/26/2010 [facility] Patient Information 2014 [place].       Edema  Edema is when you have too much fluid in your body or under your skin. Edema may make your legs, feet, and ankles swell up. Swelling is also common in looser tissues, like around your eyes. This is a common condition. It gets more common as you get older. There are many possible causes of edema. Eating too much salt (sodium) and being on your feet or sitting for a long time can cause edema in your legs, feet, and ankles. Hot weather may make edema worse. Edema is usually painless. Your skin may look swollen or shiny. Follow these instructions at home:  Keep the swollen body part raised (elevated) above the level of your heart when you are sitting or lying down.  Do not sit still or stand for a long time.  Do not wear tight clothes. Do not wear garters on your upper legs.  Exercise your legs. This can help the swelling go down.  Wear elastic bandages or  support stockings as told by your doctor.  Eat a low-salt (low-sodium) diet to reduce fluid as told by your doctor.  Depending on the cause of your swelling, you may need to limit how much fluid you drink (fluid restriction).  Take over-the-counter and prescription medicines only as told by your doctor. Contact a doctor if:  Treatment is not working.  You have heart, liver, or kidney disease and have symptoms of edema.  You have sudden and unexplained weight gain. Get help right away if:  You have shortness of breath or chest pain.  You cannot breathe when you lie down.  You have pain, redness, or warmth in the swollen areas.  You have heart, liver, or kidney disease and get edema all of a sudden.  You have a fever and your symptoms get worse all of a sudden. Summary  Edema  is when you have too much fluid in your body or under your skin.  Edema may make your legs, feet, and ankles swell up. Swelling is also common in looser tissues, like around your eyes.  Raise (elevate) the swollen body part above the level of your heart when you are sitting or lying down.  Follow your doctor's instructions about diet and how much fluid you can drink (fluid restriction). This information is not intended to replace advice given to you by your health care provider. Make sure you discuss any questions you have with your health care provider. Document Revised: 06/02/2017 Document Reviewed: 06/17/2016 Elsevier Patient Education  2020 [employer].

## 2020-03-05 LAB — COMPLETE METABOLIC PANEL WITH GFR
AG Ratio: 1.2 (calc) (ref 1.0–2.5)
ALT: 13 U/L (ref 9–46)
AST: 21 U/L (ref 10–35)
Albumin: 3.6 g/dL (ref 3.6–5.1)
Alkaline phosphatase (APISO): 75 U/L (ref 35–144)
BUN/Creatinine Ratio: 49 (calc) — ABNORMAL HIGH (ref 6–22)
BUN: 53 mg/dL — ABNORMAL HIGH (ref 7–25)
CO2: 23 mmol/L (ref 20–32)
Calcium: 9.4 mg/dL (ref 8.6–10.3)
Chloride: 99 mmol/L (ref 98–110)
Creat: 1.08 mg/dL (ref 0.70–1.18)
GFR, Est African American: 77 mL/min/{1.73_m2} (ref >=60)
GFR, Est Non African American: 66 mL/min/{1.73_m2} (ref >=60)
Globulin: 2.9 g/dL (calc) (ref 1.9–3.7)
Glucose, Bld: 117 mg/dL — ABNORMAL HIGH (ref 65–99)
Potassium: 4 mmol/L (ref 3.5–5.3)
Sodium: 135 mmol/L (ref 135–146)
Total Bilirubin: 0.4 mg/dL (ref 0.2–1.2)
Total Protein: 6.5 g/dL (ref 6.1–8.1)

## 2020-03-05 LAB — CBC WITH DIFFERENTIAL/PLATELET
Absolute Monocytes: 362 cells/uL (ref 200–950)
Basophils Absolute: 23 cells/uL (ref 0–200)
Basophils Relative: 0.3 %
Eosinophils Absolute: 46 cells/uL (ref 15–500)
Eosinophils Relative: 0.6 %
HCT: 39.1 % (ref 38.5–50.0)
Hemoglobin: 13.1 g/dL — ABNORMAL LOW (ref 13.2–17.1)
Lymphs Abs: 716 cells/uL — ABNORMAL LOW (ref 850–3900)
MCH: 31.6 pg (ref 27.0–33.0)
MCHC: 33.5 g/dL (ref 32.0–36.0)
MCV: 94.4 fL (ref 80.0–100.0)
MPV: 9.2 fL (ref 7.5–12.5)
Monocytes Relative: 4.7 %
Neutro Abs: 6553 cells/uL (ref 1500–7800)
Neutrophils Relative %: 85.1 %
Platelets: 157 10*3/uL (ref 140–400)
RBC: 4.14 10*6/uL — ABNORMAL LOW (ref 4.20–5.80)
RDW: 15.8 % — ABNORMAL HIGH (ref 11.0–15.0)
Total Lymphocyte: 9.3 %
WBC: 7.7 10*3/uL (ref 3.8–10.8)

## 2020-03-05 LAB — MAGNESIUM: Magnesium: 2 mg/dL (ref 1.5–2.5)

## 2020-03-05 LAB — URINALYSIS, ROUTINE W REFLEX MICROSCOPIC
Bilirubin Urine: NEGATIVE
Glucose, UA: NEGATIVE
Hgb urine dipstick: NEGATIVE
Ketones, ur: NEGATIVE
Leukocytes,Ua: NEGATIVE
Nitrite: NEGATIVE
Protein, ur: NEGATIVE
Specific Gravity, Urine: 1.01 (ref 1.001–1.03)
pH: 6.5 (ref 5.0–8.0)

## 2020-03-05 LAB — LIPID PANEL
Cholesterol: 164 mg/dL (ref <200)
HDL: 44 mg/dL (ref >=40)
LDL Cholesterol (Calc): 85 mg/dL (calc)
Non-HDL Cholesterol (Calc): 120 mg/dL (calc) (ref <130)
Total CHOL/HDL Ratio: 3.7 (calc) (ref <5.0)
Triglycerides: 294 mg/dL — ABNORMAL HIGH (ref <150)

## 2020-03-05 LAB — TSH: TSH: 1.39 mIU/L (ref 0.40–4.50)

## 2020-03-10 ENCOUNTER — Other Ambulatory Visit: Payer: Self-pay | Admitting: Physician Assistant

## 2020-03-10 DIAGNOSIS — E119 Type 2 diabetes mellitus without complications: Secondary | ICD-10-CM | POA: Diagnosis not present

## 2020-03-10 DIAGNOSIS — M898X9 Other specified disorders of bone, unspecified site: Secondary | ICD-10-CM | POA: Diagnosis not present

## 2020-03-10 DIAGNOSIS — C3492 Malignant neoplasm of unspecified part of left bronchus or lung: Secondary | ICD-10-CM | POA: Diagnosis not present

## 2020-03-10 DIAGNOSIS — I2510 Atherosclerotic heart disease of native coronary artery without angina pectoris: Secondary | ICD-10-CM | POA: Diagnosis not present

## 2020-03-10 DIAGNOSIS — R5381 Other malaise: Secondary | ICD-10-CM | POA: Diagnosis not present

## 2020-03-10 DIAGNOSIS — C3412 Malignant neoplasm of upper lobe, left bronchus or lung: Secondary | ICD-10-CM | POA: Diagnosis not present

## 2020-03-10 DIAGNOSIS — Z9221 Personal history of antineoplastic chemotherapy: Secondary | ICD-10-CM | POA: Diagnosis not present

## 2020-03-10 DIAGNOSIS — R531 Weakness: Secondary | ICD-10-CM | POA: Diagnosis not present

## 2020-03-10 DIAGNOSIS — I10 Essential (primary) hypertension: Secondary | ICD-10-CM | POA: Diagnosis not present

## 2020-03-19 DIAGNOSIS — Z882 Allergy status to sulfonamides status: Secondary | ICD-10-CM | POA: Diagnosis not present

## 2020-03-19 DIAGNOSIS — Z7189 Other specified counseling: Secondary | ICD-10-CM | POA: Diagnosis not present

## 2020-03-19 DIAGNOSIS — T402X5A Adverse effect of other opioids, initial encounter: Secondary | ICD-10-CM | POA: Diagnosis not present

## 2020-03-19 DIAGNOSIS — Z515 Encounter for palliative care: Secondary | ICD-10-CM | POA: Diagnosis not present

## 2020-03-19 DIAGNOSIS — G893 Neoplasm related pain (acute) (chronic): Secondary | ICD-10-CM | POA: Diagnosis not present

## 2020-03-19 DIAGNOSIS — Z79899 Other long term (current) drug therapy: Secondary | ICD-10-CM | POA: Diagnosis not present

## 2020-03-19 DIAGNOSIS — F32A Depression, unspecified: Secondary | ICD-10-CM | POA: Diagnosis not present

## 2020-03-19 DIAGNOSIS — Z87891 Personal history of nicotine dependence: Secondary | ICD-10-CM | POA: Diagnosis not present

## 2020-03-19 DIAGNOSIS — C3412 Malignant neoplasm of upper lobe, left bronchus or lung: Secondary | ICD-10-CM | POA: Diagnosis not present

## 2020-03-19 DIAGNOSIS — R630 Anorexia: Secondary | ICD-10-CM | POA: Diagnosis not present

## 2020-03-19 DIAGNOSIS — Z885 Allergy status to narcotic agent status: Secondary | ICD-10-CM | POA: Diagnosis not present

## 2020-03-19 DIAGNOSIS — C7951 Secondary malignant neoplasm of bone: Secondary | ICD-10-CM | POA: Diagnosis not present

## 2020-03-19 DIAGNOSIS — Z880 Allergy status to penicillin: Secondary | ICD-10-CM | POA: Diagnosis not present

## 2020-03-19 DIAGNOSIS — C3492 Malignant neoplasm of unspecified part of left bronchus or lung: Secondary | ICD-10-CM | POA: Diagnosis not present

## 2020-03-19 DIAGNOSIS — Z888 Allergy status to other drugs, medicaments and biological substances status: Secondary | ICD-10-CM | POA: Diagnosis not present

## 2020-03-19 DIAGNOSIS — K5903 Drug induced constipation: Secondary | ICD-10-CM | POA: Diagnosis not present

## 2020-03-23 ENCOUNTER — Telehealth: Payer: Self-pay | Admitting: *Deleted

## 2020-03-23 ENCOUNTER — Other Ambulatory Visit: Payer: Self-pay | Admitting: Internal Medicine

## 2020-03-23 MED ORDER — AZITHROMYCIN 250 MG PO TABS: ORAL_TABLET | ORAL | 1 refills | Status: AC

## 2020-03-23 MED ORDER — DEXAMETHASONE 4 MG PO TABS: ORAL_TABLET | ORAL | 0 refills | Status: AC

## 2020-03-23 NOTE — Telephone Encounter (Signed)
Returned call to patient regarding sinus infection. States he is losing his voice, but denies being around anyone with Covid. Dr Melford Aase sent in RX's for Z-pak and Dexamethasone for the patient.

## 2020-03-30 DIAGNOSIS — R918 Other nonspecific abnormal finding of lung field: Secondary | ICD-10-CM | POA: Diagnosis not present

## 2020-03-30 DIAGNOSIS — C3492 Malignant neoplasm of unspecified part of left bronchus or lung: Secondary | ICD-10-CM | POA: Diagnosis not present

## 2020-03-30 DIAGNOSIS — R531 Weakness: Secondary | ICD-10-CM | POA: Diagnosis not present

## 2020-03-30 DIAGNOSIS — R042 Hemoptysis: Secondary | ICD-10-CM | POA: Diagnosis not present

## 2020-03-30 DIAGNOSIS — I5033 Acute on chronic diastolic (congestive) heart failure: Secondary | ICD-10-CM | POA: Diagnosis not present

## 2020-03-30 DIAGNOSIS — J90 Pleural effusion, not elsewhere classified: Secondary | ICD-10-CM | POA: Diagnosis not present

## 2020-03-30 DIAGNOSIS — Z20822 Contact with and (suspected) exposure to covid-19: Secondary | ICD-10-CM | POA: Diagnosis not present

## 2020-03-30 DIAGNOSIS — R0602 Shortness of breath: Secondary | ICD-10-CM | POA: Diagnosis not present

## 2020-03-30 DIAGNOSIS — Z515 Encounter for palliative care: Secondary | ICD-10-CM | POA: Diagnosis not present

## 2020-03-30 DIAGNOSIS — R000 Tachycardia, unspecified: Secondary | ICD-10-CM | POA: Diagnosis not present

## 2020-03-30 DIAGNOSIS — R079 Chest pain, unspecified: Secondary | ICD-10-CM | POA: Diagnosis not present

## 2020-03-30 DIAGNOSIS — R5383 Other fatigue: Secondary | ICD-10-CM | POA: Diagnosis not present

## 2020-03-30 DIAGNOSIS — Z66 Do not resuscitate: Secondary | ICD-10-CM | POA: Diagnosis not present

## 2020-03-30 DIAGNOSIS — J150 Pneumonia due to Klebsiella pneumoniae: Secondary | ICD-10-CM | POA: Diagnosis not present

## 2020-03-30 DIAGNOSIS — R590 Localized enlarged lymph nodes: Secondary | ICD-10-CM | POA: Diagnosis not present

## 2020-03-31 DIAGNOSIS — I2510 Atherosclerotic heart disease of native coronary artery without angina pectoris: Secondary | ICD-10-CM | POA: Diagnosis present

## 2020-03-31 DIAGNOSIS — R042 Hemoptysis: Secondary | ICD-10-CM | POA: Diagnosis present

## 2020-03-31 DIAGNOSIS — I5033 Acute on chronic diastolic (congestive) heart failure: Secondary | ICD-10-CM | POA: Diagnosis present

## 2020-03-31 DIAGNOSIS — J910 Malignant pleural effusion: Secondary | ICD-10-CM | POA: Diagnosis not present

## 2020-03-31 DIAGNOSIS — I4510 Unspecified right bundle-branch block: Secondary | ICD-10-CM | POA: Diagnosis not present

## 2020-03-31 DIAGNOSIS — C782 Secondary malignant neoplasm of pleura: Secondary | ICD-10-CM | POA: Diagnosis present

## 2020-03-31 DIAGNOSIS — C3492 Malignant neoplasm of unspecified part of left bronchus or lung: Secondary | ICD-10-CM | POA: Diagnosis not present

## 2020-03-31 DIAGNOSIS — Z7401 Bed confinement status: Secondary | ICD-10-CM | POA: Diagnosis not present

## 2020-03-31 DIAGNOSIS — I110 Hypertensive heart disease with heart failure: Secondary | ICD-10-CM | POA: Diagnosis present

## 2020-03-31 DIAGNOSIS — R0602 Shortness of breath: Secondary | ICD-10-CM | POA: Diagnosis not present

## 2020-03-31 DIAGNOSIS — Z955 Presence of coronary angioplasty implant and graft: Secondary | ICD-10-CM | POA: Diagnosis not present

## 2020-03-31 DIAGNOSIS — E871 Hypo-osmolality and hyponatremia: Secondary | ICD-10-CM | POA: Diagnosis not present

## 2020-03-31 DIAGNOSIS — C7951 Secondary malignant neoplasm of bone: Secondary | ICD-10-CM | POA: Diagnosis not present

## 2020-03-31 DIAGNOSIS — N401 Enlarged prostate with lower urinary tract symptoms: Secondary | ICD-10-CM | POA: Diagnosis present

## 2020-03-31 DIAGNOSIS — R590 Localized enlarged lymph nodes: Secondary | ICD-10-CM | POA: Diagnosis not present

## 2020-03-31 DIAGNOSIS — Z515 Encounter for palliative care: Secondary | ICD-10-CM | POA: Diagnosis not present

## 2020-03-31 DIAGNOSIS — R000 Tachycardia, unspecified: Secondary | ICD-10-CM | POA: Diagnosis not present

## 2020-03-31 DIAGNOSIS — R918 Other nonspecific abnormal finding of lung field: Secondary | ICD-10-CM | POA: Diagnosis not present

## 2020-03-31 DIAGNOSIS — Z20822 Contact with and (suspected) exposure to covid-19: Secondary | ICD-10-CM | POA: Diagnosis present

## 2020-03-31 DIAGNOSIS — R3911 Hesitancy of micturition: Secondary | ICD-10-CM | POA: Diagnosis present

## 2020-03-31 DIAGNOSIS — J90 Pleural effusion, not elsewhere classified: Secondary | ICD-10-CM | POA: Diagnosis not present

## 2020-03-31 DIAGNOSIS — M2550 Pain in unspecified joint: Secondary | ICD-10-CM | POA: Diagnosis not present

## 2020-03-31 DIAGNOSIS — J440 Chronic obstructive pulmonary disease with acute lower respiratory infection: Secondary | ICD-10-CM | POA: Diagnosis present

## 2020-03-31 DIAGNOSIS — I248 Other forms of acute ischemic heart disease: Secondary | ICD-10-CM | POA: Diagnosis present

## 2020-03-31 DIAGNOSIS — C3412 Malignant neoplasm of upper lobe, left bronchus or lung: Secondary | ICD-10-CM | POA: Diagnosis not present

## 2020-03-31 DIAGNOSIS — Z66 Do not resuscitate: Secondary | ICD-10-CM | POA: Diagnosis not present

## 2020-03-31 DIAGNOSIS — J189 Pneumonia, unspecified organism: Secondary | ICD-10-CM | POA: Diagnosis not present

## 2020-03-31 DIAGNOSIS — Z85528 Personal history of other malignant neoplasm of kidney: Secondary | ICD-10-CM | POA: Diagnosis not present

## 2020-03-31 DIAGNOSIS — F32A Depression, unspecified: Secondary | ICD-10-CM | POA: Diagnosis present

## 2020-03-31 DIAGNOSIS — Z85820 Personal history of malignant melanoma of skin: Secondary | ICD-10-CM | POA: Diagnosis not present

## 2020-03-31 DIAGNOSIS — J150 Pneumonia due to Klebsiella pneumoniae: Secondary | ICD-10-CM | POA: Diagnosis present

## 2020-03-31 DIAGNOSIS — J9601 Acute respiratory failure with hypoxia: Secondary | ICD-10-CM | POA: Diagnosis not present

## 2020-03-31 DIAGNOSIS — K219 Gastro-esophageal reflux disease without esophagitis: Secondary | ICD-10-CM | POA: Diagnosis present

## 2020-03-31 DIAGNOSIS — E8770 Fluid overload, unspecified: Secondary | ICD-10-CM | POA: Diagnosis not present

## 2020-03-31 DIAGNOSIS — E785 Hyperlipidemia, unspecified: Secondary | ICD-10-CM | POA: Diagnosis present

## 2020-03-31 DIAGNOSIS — J918 Pleural effusion in other conditions classified elsewhere: Secondary | ICD-10-CM | POA: Diagnosis not present

## 2020-04-01 ENCOUNTER — Telehealth: Payer: Self-pay | Admitting: *Deleted

## 2020-04-01 NOTE — Telephone Encounter (Signed)
Per Dr Melford Aase, he will be the attending physician for the patient.

## 2020-04-03 ENCOUNTER — Other Ambulatory Visit: Payer: Self-pay | Admitting: Internal Medicine

## 2020-04-03 DIAGNOSIS — D6869 Other thrombophilia: Secondary | ICD-10-CM | POA: Diagnosis not present

## 2020-04-03 DIAGNOSIS — C781 Secondary malignant neoplasm of mediastinum: Secondary | ICD-10-CM | POA: Diagnosis not present

## 2020-04-03 DIAGNOSIS — I110 Hypertensive heart disease with heart failure: Secondary | ICD-10-CM | POA: Diagnosis not present

## 2020-04-03 DIAGNOSIS — Z86711 Personal history of pulmonary embolism: Secondary | ICD-10-CM | POA: Diagnosis not present

## 2020-04-03 DIAGNOSIS — J302 Other seasonal allergic rhinitis: Secondary | ICD-10-CM | POA: Diagnosis not present

## 2020-04-03 DIAGNOSIS — C3412 Malignant neoplasm of upper lobe, left bronchus or lung: Secondary | ICD-10-CM | POA: Diagnosis not present

## 2020-04-03 DIAGNOSIS — G4733 Obstructive sleep apnea (adult) (pediatric): Secondary | ICD-10-CM | POA: Diagnosis not present

## 2020-04-03 DIAGNOSIS — N400 Enlarged prostate without lower urinary tract symptoms: Secondary | ICD-10-CM | POA: Diagnosis not present

## 2020-04-03 DIAGNOSIS — E785 Hyperlipidemia, unspecified: Secondary | ICD-10-CM | POA: Diagnosis not present

## 2020-04-03 DIAGNOSIS — F32A Depression, unspecified: Secondary | ICD-10-CM | POA: Diagnosis not present

## 2020-04-03 DIAGNOSIS — E669 Obesity, unspecified: Secondary | ICD-10-CM | POA: Diagnosis not present

## 2020-04-03 DIAGNOSIS — J9621 Acute and chronic respiratory failure with hypoxia: Secondary | ICD-10-CM | POA: Diagnosis not present

## 2020-04-03 DIAGNOSIS — J189 Pneumonia, unspecified organism: Secondary | ICD-10-CM | POA: Diagnosis not present

## 2020-04-03 DIAGNOSIS — C7951 Secondary malignant neoplasm of bone: Secondary | ICD-10-CM | POA: Diagnosis not present

## 2020-04-03 DIAGNOSIS — E118 Type 2 diabetes mellitus with unspecified complications: Secondary | ICD-10-CM | POA: Diagnosis not present

## 2020-04-03 DIAGNOSIS — I5030 Unspecified diastolic (congestive) heart failure: Secondary | ICD-10-CM | POA: Diagnosis not present

## 2020-04-03 DIAGNOSIS — I2510 Atherosclerotic heart disease of native coronary artery without angina pectoris: Secondary | ICD-10-CM | POA: Diagnosis not present

## 2020-04-03 DIAGNOSIS — Z87891 Personal history of nicotine dependence: Secondary | ICD-10-CM | POA: Diagnosis not present

## 2020-04-03 MED ORDER — SERTRALINE HCL 25 MG PO TABS: ORAL_TABLET | ORAL | 0 refills | Status: AC

## 2020-04-06 ENCOUNTER — Other Ambulatory Visit: Payer: Self-pay | Admitting: Internal Medicine

## 2020-04-06 ENCOUNTER — Telehealth: Payer: Self-pay | Admitting: *Deleted

## 2020-04-06 DIAGNOSIS — J189 Pneumonia, unspecified organism: Secondary | ICD-10-CM | POA: Diagnosis not present

## 2020-04-06 DIAGNOSIS — C781 Secondary malignant neoplasm of mediastinum: Secondary | ICD-10-CM | POA: Diagnosis not present

## 2020-04-06 DIAGNOSIS — C3412 Malignant neoplasm of upper lobe, left bronchus or lung: Secondary | ICD-10-CM | POA: Diagnosis not present

## 2020-04-06 DIAGNOSIS — C7951 Secondary malignant neoplasm of bone: Secondary | ICD-10-CM | POA: Diagnosis not present

## 2020-04-06 DIAGNOSIS — J9621 Acute and chronic respiratory failure with hypoxia: Secondary | ICD-10-CM | POA: Diagnosis not present

## 2020-04-06 DIAGNOSIS — D6869 Other thrombophilia: Secondary | ICD-10-CM | POA: Diagnosis not present

## 2020-04-06 MED ORDER — IPRATROPIUM BROMIDE 0.06 % NA SOLN: NASAL | 3 refills | Status: AC

## 2020-04-06 NOTE — Telephone Encounter (Signed)
Spouse called and asked for a refill of nasal spray, Nasocort, for patient, due to recent nosebleeds. RX sent in by Dr Melford Aase.

## 2020-04-07 DIAGNOSIS — J189 Pneumonia, unspecified organism: Secondary | ICD-10-CM | POA: Diagnosis not present

## 2020-04-07 DIAGNOSIS — C7951 Secondary malignant neoplasm of bone: Secondary | ICD-10-CM | POA: Diagnosis not present

## 2020-04-07 DIAGNOSIS — C3412 Malignant neoplasm of upper lobe, left bronchus or lung: Secondary | ICD-10-CM | POA: Diagnosis not present

## 2020-04-07 DIAGNOSIS — J9621 Acute and chronic respiratory failure with hypoxia: Secondary | ICD-10-CM | POA: Diagnosis not present

## 2020-04-07 DIAGNOSIS — D6869 Other thrombophilia: Secondary | ICD-10-CM | POA: Diagnosis not present

## 2020-04-07 DIAGNOSIS — C781 Secondary malignant neoplasm of mediastinum: Secondary | ICD-10-CM | POA: Diagnosis not present

## 2020-04-08 ENCOUNTER — Emergency Department (HOSPITAL_COMMUNITY)
Admission: EM | Admit: 2020-04-08 | Discharge: 2020-04-13 | Disposition: E | Payer: Medicare Other | Attending: Emergency Medicine | Admitting: Emergency Medicine

## 2020-04-08 ENCOUNTER — Emergency Department (HOSPITAL_COMMUNITY): Payer: Medicare Other

## 2020-04-08 ENCOUNTER — Other Ambulatory Visit: Payer: Self-pay

## 2020-04-08 DIAGNOSIS — Z20822 Contact with and (suspected) exposure to covid-19: Secondary | ICD-10-CM | POA: Diagnosis not present

## 2020-04-08 DIAGNOSIS — C781 Secondary malignant neoplasm of mediastinum: Secondary | ICD-10-CM | POA: Diagnosis not present

## 2020-04-08 DIAGNOSIS — Z79899 Other long term (current) drug therapy: Secondary | ICD-10-CM | POA: Insufficient documentation

## 2020-04-08 DIAGNOSIS — C3490 Malignant neoplasm of unspecified part of unspecified bronchus or lung: Secondary | ICD-10-CM | POA: Diagnosis not present

## 2020-04-08 DIAGNOSIS — J90 Pleural effusion, not elsewhere classified: Secondary | ICD-10-CM | POA: Insufficient documentation

## 2020-04-08 DIAGNOSIS — Z87891 Personal history of nicotine dependence: Secondary | ICD-10-CM | POA: Diagnosis not present

## 2020-04-08 DIAGNOSIS — I129 Hypertensive chronic kidney disease with stage 1 through stage 4 chronic kidney disease, or unspecified chronic kidney disease: Secondary | ICD-10-CM | POA: Diagnosis not present

## 2020-04-08 DIAGNOSIS — R000 Tachycardia, unspecified: Secondary | ICD-10-CM | POA: Diagnosis not present

## 2020-04-08 DIAGNOSIS — D6869 Other thrombophilia: Secondary | ICD-10-CM | POA: Diagnosis not present

## 2020-04-08 DIAGNOSIS — I2510 Atherosclerotic heart disease of native coronary artery without angina pectoris: Secondary | ICD-10-CM | POA: Diagnosis not present

## 2020-04-08 DIAGNOSIS — J189 Pneumonia, unspecified organism: Secondary | ICD-10-CM | POA: Diagnosis not present

## 2020-04-08 DIAGNOSIS — J45909 Unspecified asthma, uncomplicated: Secondary | ICD-10-CM | POA: Insufficient documentation

## 2020-04-08 DIAGNOSIS — R0602 Shortness of breath: Secondary | ICD-10-CM | POA: Diagnosis not present

## 2020-04-08 DIAGNOSIS — R0689 Other abnormalities of breathing: Secondary | ICD-10-CM | POA: Diagnosis not present

## 2020-04-08 DIAGNOSIS — C7951 Secondary malignant neoplasm of bone: Secondary | ICD-10-CM | POA: Diagnosis not present

## 2020-04-08 DIAGNOSIS — R0902 Hypoxemia: Secondary | ICD-10-CM | POA: Diagnosis not present

## 2020-04-08 DIAGNOSIS — R609 Edema, unspecified: Secondary | ICD-10-CM | POA: Diagnosis not present

## 2020-04-08 DIAGNOSIS — Z7901 Long term (current) use of anticoagulants: Secondary | ICD-10-CM | POA: Insufficient documentation

## 2020-04-08 DIAGNOSIS — N183 Chronic kidney disease, stage 3 unspecified: Secondary | ICD-10-CM | POA: Insufficient documentation

## 2020-04-08 DIAGNOSIS — R110 Nausea: Secondary | ICD-10-CM | POA: Diagnosis not present

## 2020-04-08 DIAGNOSIS — C3412 Malignant neoplasm of upper lobe, left bronchus or lung: Secondary | ICD-10-CM | POA: Diagnosis not present

## 2020-04-08 DIAGNOSIS — J9621 Acute and chronic respiratory failure with hypoxia: Secondary | ICD-10-CM | POA: Diagnosis not present

## 2020-04-08 LAB — CBC WITH DIFFERENTIAL/PLATELET
Abs Immature Granulocytes: 0.18 10*3/uL — ABNORMAL HIGH (ref 0.00–0.07)
Basophils Absolute: 0 10*3/uL (ref 0.0–0.1)
Basophils Relative: 0 %
Eosinophils Absolute: 0 10*3/uL (ref 0.0–0.5)
Eosinophils Relative: 0 %
HCT: 34.7 % — ABNORMAL LOW (ref 39.0–52.0)
Hemoglobin: 11.2 g/dL — ABNORMAL LOW (ref 13.0–17.0)
Immature Granulocytes: 2 %
Lymphocytes Relative: 8 %
Lymphs Abs: 0.6 10*3/uL — ABNORMAL LOW (ref 0.7–4.0)
MCH: 31.3 pg (ref 26.0–34.0)
MCHC: 32.3 g/dL (ref 30.0–36.0)
MCV: 96.9 fL (ref 80.0–100.0)
Monocytes Absolute: 0.1 10*3/uL (ref 0.1–1.0)
Monocytes Relative: 2 %
Neutro Abs: 6.6 10*3/uL (ref 1.7–7.7)
Neutrophils Relative %: 88 %
Platelets: 154 10*3/uL (ref 150–400)
RBC: 3.58 MIL/uL — ABNORMAL LOW (ref 4.22–5.81)
RDW: 17.5 % — ABNORMAL HIGH (ref 11.5–15.5)
WBC: 7.5 10*3/uL (ref 4.0–10.5)
nRBC: 0 % (ref 0.0–0.2)

## 2020-04-08 LAB — BASIC METABOLIC PANEL
Anion gap: 14 (ref 5–15)
BUN: 60 mg/dL — ABNORMAL HIGH (ref 8–23)
CO2: 25 mmol/L (ref 22–32)
Calcium: 9.4 mg/dL (ref 8.9–10.3)
Chloride: 95 mmol/L — ABNORMAL LOW (ref 98–111)
Creatinine, Ser: 1.26 mg/dL — ABNORMAL HIGH (ref 0.61–1.24)
GFR, Estimated: 59 mL/min — ABNORMAL LOW (ref >60)
Glucose, Bld: 153 mg/dL — ABNORMAL HIGH (ref 70–99)
Potassium: 5 mmol/L (ref 3.5–5.1)
Sodium: 134 mmol/L — ABNORMAL LOW (ref 135–145)

## 2020-04-08 LAB — RESPIRATORY PANEL BY RT PCR (FLU A&B, COVID)
Influenza A by PCR: NEGATIVE
Influenza B by PCR: NEGATIVE
SARS Coronavirus 2 by RT PCR: NEGATIVE

## 2020-04-08 LAB — PROTIME-INR
INR: 1.3 — ABNORMAL HIGH (ref 0.8–1.2)
Prothrombin Time: 15.9 seconds — ABNORMAL HIGH (ref 11.4–15.2)

## 2020-04-08 MED ORDER — GLYCOPYRROLATE 0.2 MG/ML IJ SOLN
0.2000 mg | INTRAMUSCULAR | Status: DC | PRN
  Administered 2020-04-08: 0.2 mg via INTRAVENOUS
  Filled 2020-04-08: qty 1

## 2020-04-08 MED ORDER — AMIODARONE IV BOLUS ONLY 150 MG/100ML
150.0000 mg | Freq: Once | INTRAVENOUS | Status: AC
  Administered 2020-04-08: 150 mg via INTRAVENOUS
  Filled 2020-04-08: qty 100

## 2020-04-08 MED ORDER — DEXTROSE 5 % IV SOLN: 300.0000 mg | Freq: Once | INTRAVENOUS | Status: DC

## 2020-04-08 MED ORDER — FUROSEMIDE 10 MG/ML IJ SOLN
80.0000 mg | Freq: Once | INTRAMUSCULAR | Status: AC
  Administered 2020-04-08: 80 mg via INTRAVENOUS
  Filled 2020-04-08: qty 8

## 2020-04-08 MED ORDER — ONDANSETRON HCL 4 MG/2ML IJ SOLN
4.0000 mg | Freq: Once | INTRAMUSCULAR | Status: AC
  Administered 2020-04-08: 4 mg via INTRAVENOUS
  Filled 2020-04-08: qty 2

## 2020-04-08 MED ORDER — MORPHINE SULFATE (PF) 4 MG/ML IV SOLN
4.0000 mg | Freq: Once | INTRAVENOUS | Status: AC
  Administered 2020-04-08: 4 mg via INTRAVENOUS
  Filled 2020-04-08: qty 1

## 2020-04-08 MED ORDER — DIPHENHYDRAMINE HCL 50 MG/ML IJ SOLN: 12.5000 mg | INTRAMUSCULAR | Status: DC | PRN

## 2020-04-08 MED ORDER — AMIODARONE IV BOLUS ONLY 150 MG/100ML
150.0000 mg | Freq: Once | INTRAVENOUS | Status: AC
  Administered 2020-04-08: 150 mg via INTRAVENOUS
  Filled 2020-04-08 (×2): qty 100

## 2020-04-08 MED ORDER — AMIODARONE HCL IN DEXTROSE 360-4.14 MG/200ML-% IV SOLN
60.0000 mg/h | INTRAVENOUS | Status: AC
  Administered 2020-04-08: 60 mg/h via INTRAVENOUS

## 2020-04-08 MED ORDER — ONDANSETRON 4 MG PO TBDP: 4.0000 mg | ORAL_TABLET | Freq: Four times a day (QID) | ORAL | Status: DC | PRN

## 2020-04-08 MED ORDER — LORAZEPAM 1 MG PO TABS: 1.0000 mg | ORAL_TABLET | ORAL | Status: DC | PRN

## 2020-04-08 MED ORDER — GLYCOPYRROLATE 1 MG PO TABS
1.0000 mg | ORAL_TABLET | ORAL | Status: DC | PRN
  Filled 2020-04-08: qty 1

## 2020-04-08 MED ORDER — LORAZEPAM 2 MG/ML IJ SOLN: 1.0000 mg | INTRAMUSCULAR | Status: DC | PRN

## 2020-04-08 MED ORDER — GLYCOPYRROLATE 0.2 MG/ML IJ SOLN: 0.2000 mg | INTRAMUSCULAR | Status: DC | PRN

## 2020-04-08 MED ORDER — LIDOCAINE HCL 1 % IJ SOLN
INTRAMUSCULAR | Status: AC
  Filled 2020-04-08: qty 20

## 2020-04-08 MED ORDER — AMIODARONE HCL IN DEXTROSE 360-4.14 MG/200ML-% IV SOLN
30.0000 mg/h | INTRAVENOUS | Status: DC
  Filled 2020-04-08: qty 200

## 2020-04-08 MED ORDER — ONDANSETRON HCL 4 MG/2ML IJ SOLN: 4.0000 mg | Freq: Four times a day (QID) | INTRAMUSCULAR | Status: DC | PRN

## 2020-04-08 MED ORDER — MORPHINE SULFATE (PF) 4 MG/ML IV SOLN
4.0000 mg | INTRAVENOUS | Status: DC | PRN
  Administered 2020-04-08: 4 mg via INTRAVENOUS
  Filled 2020-04-08 (×2): qty 1

## 2020-04-08 MED ORDER — LORAZEPAM 2 MG/ML PO CONC
1.0000 mg | ORAL | Status: DC | PRN
  Filled 2020-04-08: qty 0.5

## 2020-04-08 MED ORDER — LORAZEPAM 2 MG/ML IJ SOLN
1.0000 mg | Freq: Once | INTRAMUSCULAR | Status: AC
  Administered 2020-04-08: 1 mg via INTRAVENOUS
  Filled 2020-04-08: qty 1

## 2020-04-13 NOTE — ED Provider Notes (Signed)
[facility]-EMERGENCY DEPT Provider Note   CSN: [id number] Arrival date & time: 04/01/2020  0502     History Chief Complaint  Patient presents with  . Shortness of Breath    Patient here from home stating that he woke up sob and did not want to wait for his hospice nurse.    [name] is a 74 y.o. male.  History of cancer (RCC and now with adenocarcinoma of lung) here with severe dyspnea.  On review the records appears the patient was just recently discharged from [facility] in [place] for pleural effusion, respiratory distress and had had a thoracentesis.  He is on hospice and comfort care.  Is DNR.  This is confirmed by him and his wife.  Sounds like a worsening dyspnea and weakness throughout the day today.  They called hospice nurse who was with another nurse and it would take over an hour for her to really come and see them (their complaint to her was difficulty using the commode) so they called EMS and brought here for further evaluation.  Patient is mostly complaining of weakness, nausea and dyspnea. Would not want surgery, extensive evaluation/workups but would be ok with minor procedures if it helped him be more comfortable.    Shortness of Breath Severity:  Severe Onset quality:  Gradual Duration:  3 weeks Timing:  Constant Progression:  Worsening Chronicity:  Recurrent Relieved by: thoracentesis in past. Exacerbated by: time.      Past Medical History:  Diagnosis Date  . Anginal pain (HCC)   . Anxiety    treated /w Xanax for mild depression , using 2 times per day, not PRN  . Asthma   . Benign prostatic hypertrophy   . CAD (coronary artery disease)    pt last left heart cath was in Nov 2008. EF was 55% on left ventriculogram. Circumflex was totally occluded after the 1st obtuse marginal with collaterals supplying the distal circumflex. LAD showed luminal irregularities. The stent in LAD was patent. The RCA showed luminal  irregularities. The stent in th emid RCA and PDA were patent. There were no inverventions.   . Chest pain    from anxiety last time 1 month ago  . 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 Chronic obstructive pulmonary disease (HCC)    asthma  . Cluster headache    relative to sinus problems none recnt  . Constipation   . Depression   . DM2 (diabetes mellitus, type 2) (HCC)    well with diet and exercise controlled  . GERD (gastroesophageal reflux disease)    hiatal hernia. Patient did have a Nissen fundoplication rare  . Heart murmur    heard 30 yrs ago  . History of blood transfusion    need for Bld. transfusion relative to taking NSAIDS  . HTN (hypertension)    pt. followed by [facility], last cardiac visit 2012, one yr. ago  . Hyperlipidemia   . Joint pain   . Lactose intolerance   . Left kidney mass   . Low back pain    chronic  . Metastatic non-small cell lung cancer (HCC)    adenocarcinoma, LUL, bone metastasis in 09/2018  . Neuromuscular disorder (HCC)    nerve involvement in back & upper back relative to hardware in neck   . Obesity   . OSA and COPD overlap syndrome (HCC) 02/25/2015   no cpap used pt denies sleep apnea  .  Osteoarthritis   . Peptic ulcer disease   . Pneumonia not recent per pt  . 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 Sleep concern    states the study (2003 )was done at [facility] it failed & he was told to return & he never did. Pt. states he has been found by prev. hosp. staff that he has to be told when to breathe & his wife does the same.   Marland Kitchen Spinal fracture    hx of traumatic in Jun 04, 2007 after falling off the roof  . Tinnitus, bilateral   . Vitamin D deficiency     Patient Active Problem List   Diagnosis Date Noted  . Current mild episode of major depressive disorder without prior episode (HCC) 03/04/2020  . Secondary malignant neoplasm of  bone (HCC) 09/02/2019  . Statin myopathy 07/16/2019  . History of pulmonary embolus (PE) - 12/2018 12/20/2018  . Port-A-Cath in place 11/22/2018  . Encounter for antineoplastic chemotherapy 10/09/2018  . Encounter for antineoplastic immunotherapy 10/09/2018  . Adenocarcinoma of left lung, stage 4 (HCC)  09/25/2018  . Goals of care, counseling/discussion 09/25/2018  . Renal cell carcinoma, left (HCC) 09/11/2018  . Chronic kidney disease (CKD), stage III (moderate) (HCC) 03/27/2018  . 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 Aortic atherosclerosis (HCC) 04/28/2017  . OSA and COPD overlap syndrome (HCC) 09/14/2015  . Obesity (BMI 30.0-34.9) 03/11/2015  . Rhinitis, nonallergic, chronic 07/01/2014  . Vitamin D deficiency 07/25/2013  . Medication management 07/25/2013  . Hyperlipidemia 01/13/2009  . Cluster headache syndrome 01/13/2009  . Essential hypertension 01/13/2009  . Coronary atherosclerosis 01/13/2009  . COPD mixed type (HCC) 01/13/2009  . GERD 01/13/2009  . Lumbago, Chronic 01/13/2009  . PUD 01/13/2009  . BPH/Prostatism 01/13/2009    Past Surgical History:  Procedure Laterality Date  . BACK SURGERY     x3- last surgery lumbar- 1998- / fusion   . blepheroplasty     both eyesx2  . C5-T6 posterior fusion     with Oasis and radius screws  . CARDIAC CATHETERIZATION     1998, 2008 & 2012 multiple stents  total 4 times  . cataracts     cataracts removed- /w IOL - both eyes   . CERVICAL SPINE SURGERY    . COLONOSCOPY    . ESOPHAGEAL MANOMETRY    . HARDWARE REMOVAL  02/20/2012   Procedure: HARDWARE REMOVAL;  Surgeon: Mariam Dollar, MD;  Location: [facility];  Service: Neurosurgery;  Laterality: N/A;  Hardware Removal  . HIATAL HERNIA REPAIR  1979, spring 2009  . IR IMAGING GUIDED PORT INSERTION  10/29/2018  . LAPAROSCOPIC CHOLECYSTECTOMY     & IOC  .  multiple percutaneous coronary interventions    . NASAL SINUS SURGERY     x2, sees Dr. Suszanne Conners, still having problems, states he uses Benadryl PRN- up to 5 times per day   . right temporal artery biopsy    . right wrist plate insertion  06/2007  . ROBOTIC ASSITED PARTIAL NEPHRECTOMY Left 03/02/2018   Procedure: XI ROBOTIC ASSITED LEFT PARTIAL NEPHRECTOMY WITH LYSIS OF ADHESIONS X30 MINUTES.;  Surgeon: Crist Fat, MD;  Location: [facility];  Service: Urology;  Laterality: Left;  CLAMP TIME FOR KIDNEY 1053-1110 TOTAL  . Rotator cuff surgery Right   .  SKIN BIOPSY Right 10/31/2017   Chest  . TRANSURETHRAL RESECTION OF BLADDER TUMOR N/A 11/03/2017   Procedure: cystoscopy;  Surgeon: Ihor Gully, MD;  Location: [facility];  Service: Urology;  Laterality: N/A;  . UMBILICAL HERNIA REPAIR    . UPPER GI ENDOSCOPY    . VENTRAL HERNIA REPAIR N/A 03/02/2018   Procedure: LAPAROSCOPIC REPAIR OF INCARCERATED INCISIONAL HERNIA WITH LYSIS OF ADHESIONS;  Surgeon: Karie Soda, MD;  Location: [facility];  Service: General;  Laterality: N/A;       Family History  Problem Relation Age of Onset  . Heart failure Mother        in her 62s  . Hypertension Mother   . Heart attack Mother   . Obesity Mother   . Coronary artery disease Father        developed in his 54s  . Hypertension Father   . Heart attack Father   . Heart attack Brother        in there 60s  . Heart attack Brother        in there 60s  . Gout Sister     Social History   Tobacco Use  . Smoking status: Former Smoker    Packs/day: 1.00    Years: 20.00    Pack years: 20.00    Types: Cigarettes    Quit date: 02/14/1978    Years since quitting: 42.1  . Smokeless tobacco: Never Used  Vaping Use  . Vaping Use: Never used  Substance Use Topics  . Alcohol use: Not Currently    Alcohol/week: 0.0 standard drinks    Comment: rare  . Drug use: No    Home Medications Prior to Admission medications   Medication Sig Start Date End  Date Taking? Authorizing Provider  acetaminophen (TYLENOL) 500 MG tablet Take 1 tablet (500 mg total) by mouth every 6 (six) hours as needed. 02/28/17   Quillian Quince D, MD  albuterol (VENTOLIN HFA) 108 (90 Base) MCG/ACT inhaler Inhale 2 puffs into the lungs every 4 (four) hours as needed for wheezing or shortness of breath. 12/26/18   Doree Albee, PA-C  Alpha-Lipoic Acid 200 MG CAPS Take by mouth 2 (two) times daily.    [provider]  Ascorbic Acid (VITAMIN C) 1000 MG tablet Take 1,000 mg by mouth 2 (two) times daily.    [provider]  azithromycin (ZITHROMAX) 250 MG tablet Take 2 tablets with Food on  Day 1, then 1 tablet Daily with Food for Infection 03/23/20   Lucky Cowboy, MD  Blood Glucose Monitoring Suppl (FREESTYLE FREEDOM LITE) w/Device KIT Check blood sugar 1 time a day. [id number] 01/04/18   Lucky Cowboy, MD  Cholecalciferol (VITAMIN D3) 5000 units CAPS Take 5,000 Units by mouth See admin instructions. Take 10 caps weekly     [provider]  CHOLINE PO Take 600 mg by mouth daily.    [provider]  clotrimazole-betamethasone (LOTRISONE) cream Apply to blistering Rash & Groin Rash 3 x / day 11/22/19 11/21/20  Lucky Cowboy, MD  dexamethasone (DECADRON) 4 MG tablet Take 1 tab 3 x day - 3 days, then 2 x day - 3 days, then 1 tab daily 03/23/20   Lucky Cowboy, MD  diazepam (VALIUM) 5 MG tablet Take 1/2 to 1 tablet 2 to 3 x /day as needed for Anxiety 01/30/19   Lucky Cowboy, MD  DOXYLAMINE SUCCINATE PO Take 0.5 tablets by mouth at bedtime.     [provider]  Everlene Balls  5 MG TABS tablet Take 5 mg by mouth 2 (two) times daily. 01/14/20   [provider]  folic acid (FOLVITE) 1 MG tablet Take 1 tablet (1 mg total) by mouth daily. 10/09/18   Si Gaul, MD  furosemide (LASIX) 80 MG tablet Take 80 mg by mouth daily.    [provider]  ipratropium (ATROVENT) 0.06 % nasal spray Use 1 to 2 sprays each nostril 2 to  3 x /day as needed 04/06/20   Lucky Cowboy, MD  lidocaine-prilocaine (EMLA) cream Apply 1 application topically as needed. 10/24/18   Heilingoetter, Cassandra L, PA-C  magnesium oxide (MAG-OX) 400 MG tablet Take 1,200 mg by mouth daily.     [provider]  meloxicam (MOBIC) 15 MG tablet Take 1/2 to 1 tablet Daily for Pain & Inflammation and try limit to 4 to 5 days /week to Avoid Kidney Damage 10/21/19   Lucky Cowboy, MD  methylphenidate (RITALIN) 5 MG tablet Take 5 mg by mouth daily.    [provider]  Multiple Vitamins-Minerals (MULTIVITAMIN ADULT PO) Take by mouth daily.    [provider]  nitroGLYCERIN (NITROSTAT) 0.4 MG SL tablet Dissolve 1 tablet under tongue every 3 to 5 minutes if needed for Chest Pain 12/05/18   Lucky Cowboy, MD  omega-3 acid ethyl esters (LOVAZA) 1 g capsule Take 2 capsules (2 g total) by mouth 2 (two) times daily. 07/16/19   Lars Masson, MD  OVER THE COUNTER MEDICATION Taking OTC Iron 1 tablet daily.    [provider]  OVER THE COUNTER MEDICATION OTC Nasacrom NS 1 spray each nostril 1 to 2 times daily.    [provider]  prochlorperazine (COMPAZINE) 10 MG tablet Take 1 tablet (10 mg total) by mouth every 6 (six) hours as needed for nausea or vomiting. 10/09/18   Si Gaul, MD  sertraline (ZOLOFT) 25 MG tablet Take      1 tablet      Daily      for Mood 04/03/20   Lucky Cowboy, MD  SUPER B COMPLEX/C PO Take by mouth daily.    [provider]  SYMBICORT 160-4.5 MCG/ACT inhaler Use    1 Inhalation      2 x /day (every 12 hours) 03/10/20   Lucky Cowboy, MD  tamsulosin [facility]) 0.4 MG CAPS capsule Take 1 capsule Daily for for Prostate 12/18/19   Lucky Cowboy, MD  tetrahydrozoline-zinc (VISINE-AC) 0.05-0.25 % ophthalmic solution Place 1-2 drops into both eyes 3 (three) times daily as needed (for redness/irritation.).    [provider]  triamcinolone (NASACORT) 55 MCG/ACT AERO nasal  inhaler Place 2 sprays into the nose at bedtime. 12/17/18 12/17/19  Doree Albee, PA-C  triamcinolone ointment (KENALOG) 0.1 % Apply 1 application topically 2 (two) times daily. 03/26/18   Judd Gaudier, NP  valACYclovir (VALTREX) 1000 MG tablet Take 1 tablet   3 x  /day for Viral Blistering Rash 03/04/20   Judd Gaudier, NP  Zinc 50 MG TABS Take by mouth daily.    [provider]    Allergies    Codeine, Meloxicam, Nsaids, Oxycodone, Crestor [rosuvastatin], Cymbalta [duloxetine hcl], Dilaudid [hydromorphone hcl], Effexor [venlafaxine], Gluten meal, Topamax [topiramate], Tramadol hcl, Trazodone and nefazodone, Adhesive [tape], Morphine and related, Penicillins, and Sulfa antibiotics  Review of Systems   Review of Systems  Unable to perform ROS: Acuity of condition  Respiratory: Positive for shortness of breath.     Physical Exam Updated Vital Signs BP 103/76  Pulse (!) 143   Temp (!) 97.4 F (36.3 C) (Axillary)   Resp (!) 22   SpO2 93%   Physical Exam Vitals and nursing note reviewed.  Constitutional:      General: He is in acute distress.     Appearance: He is ill-appearing.  Eyes:     Extraocular Movements: Extraocular movements intact.  Cardiovascular:     Rate and Rhythm: Regular rhythm. Tachycardia present.  Pulmonary:     Effort: Tachypnea and accessory muscle usage present.     Breath sounds: Examination of the left-upper field reveals decreased breath sounds. Examination of the left-middle field reveals decreased breath sounds. Examination of the left-lower field reveals decreased breath sounds. Decreased breath sounds present. No wheezing or rhonchi.  Chest:     Chest wall: No mass or tenderness.  Skin:    General: Skin is warm and dry.     Coloration: Skin is pale.  Neurological:     General: No focal deficit present.     Mental Status: He is alert.     ED Results / Procedures / Treatments   Labs (all labs ordered are listed, but only abnormal  results are displayed) Labs Reviewed  CBC WITH DIFFERENTIAL/PLATELET  BASIC METABOLIC PANEL  PROTIME-INR    EKG None  Radiology DG Chest Portable 1 View  Result Date: 03/18/2020 CLINICAL DATA:  Lung cancer.  Shortness of breath EXAM: PORTABLE CHEST 1 VIEW COMPARISON:  08/27/2019 chest CT FINDINGS: Extensive opacification of the left chest with evidence of large left pleural effusion. The right lung is clear. Multilevel spinal fusion. Right-sided porta catheter with tip at the lower SVC. Heart size is obscured by the pulmonary disease. IMPRESSION: Extensive opacification of the left chest at least partially from a large pleural effusion. Electronically Signed   By: Marnee Spring M.D.   On: 03/22/2020 06:30    Procedures .Critical Care Performed by: Marily Memos, MD Authorized by: Marily Memos, MD   Critical care provider statement:    Critical care time (minutes):  45   Critical care was necessary to treat or prevent imminent or life-threatening deterioration of the following conditions:  Shock, respiratory failure and circulatory failure   Critical care was time spent personally by me on the following activities:  Discussions with consultants, evaluation of patient's response to treatment, examination of patient, ordering and performing treatments and interventions, ordering and review of laboratory studies, ordering and review of radiographic studies, pulse oximetry, re-evaluation of patient's condition, obtaining history from patient or surrogate and review of old charts   (including critical care time)  Medications Ordered in ED Medications  morphine 4 MG/ML injection 4 mg (has no administration in time range)  LORazepam (ATIVAN) tablet 1 mg (has no administration in time range)    Or  LORazepam (ATIVAN) 2 MG/ML concentrated solution 1 mg (has no administration in time range)    Or  LORazepam (ATIVAN) injection 1 mg (has no administration in time range)  diphenhydrAMINE  (BENADRYL) injection 12.5 mg (has no administration in time range)  ondansetron (ZOFRAN-ODT) disintegrating tablet 4 mg (has no administration in time range)    Or  ondansetron (ZOFRAN) injection 4 mg (has no administration in time range)  glycopyrrolate (ROBINUL) tablet 1 mg (has no administration in time range)    Or  glycopyrrolate (ROBINUL) injection 0.2 mg (has no administration in time range)    Or  glycopyrrolate (ROBINUL) injection 0.2 mg (has no administration in time range)  ondansetron (ZOFRAN) injection  4 mg (4 mg Intravenous Given 25-Apr-2020 0610)  LORazepam (ATIVAN) injection 1 mg (1 mg Intravenous Given 04/25/20 0610)  morphine 4 MG/ML injection 4 mg (4 mg Intravenous Given 04-25-2020 0610)  furosemide (LASIX) injection 80 mg (80 mg Intravenous Given 04-25-20 1610)    ED Course  I have reviewed the triage vital signs and the nursing notes.  Pertinent labs & imaging results that were available during my care of the patient were reviewed by me and considered in my medical decision making (see chart for details).    MDM Rules/Calculators/A&P                          Patient here based off his exam findings I think it is most likely to be a pleural effusion which was confirmed by x-ray.  I did a bedside ultrasound to see if I could see any fluid to pull off and I do not see any simple fluid or obvious area to draw fluid.  Patient also not able to tolerate sitting up for very long.  This makes me wonder if this is more consistent with hemorrhagic rather than early transudative.  Either way I do not have a good ultrasound landmarks to go by to do a bedside thoracentesis.  I discussed with the family, patient and hospice nurse and all agree that the patient is DNR.  He is also comfort care.  Requested Lasix specifically but also wanted other comfort measures.  Comfort care order set ordered.  Informed by the nurse that the patient had a rapid heart rate.  On evaluation patient appears  to have atrial fibrillation with rapid ventricular response.  Appears slightly wide but I do not think it is V. tach.  Either way we will try amiodarone at this time.  Patient's breathing is more comfortable.  Wife understands the implications of giving morphine and Ativan in the setting.  She understands he will likely die from this.  Still like to proceed with thoracentesis if possible.  CBC, BMP and PT/INR ordered.  Discussed with Dr. Elby Showers with interventional radiology.  He states that the patient can hopefully be done around 8:00.  Wife updated.  Discussed with Shanda Bumps with [facility] who will pass on to the day team and hospital liaison to help coordinate care while in the hospital and upon discharge if he makes it.  Care transferred pending thoracentesis and further management as needed.   Final Clinical Impression(s) / ED Diagnoses Final diagnoses:  Pleural effusion    Rx / DC Orders ED Discharge Orders    None       Zhyon Antenucci, Barbara Cower, MD 2020-04-25 2354

## 2020-04-13 NOTE — ED Notes (Signed)
Ultrasound here to take pt for thoracentesis. BP maintaining 2 consecutive times with 99Y systolic. Pt not responding to voice anymore. Gave wife update on pt's status and if he were to go for procedure at this time he might not be strong enough to make it through it. We can keep patient here in ED and keep comfortable and she can be at bedside when he takes his last breath. Wife making decision while this RN goes to get Dr Sherry Ruffing to come assess patient. Ultrasound to stay on standy until wife and EDP make decision about proceeding with procedure.

## 2020-04-13 NOTE — ED Notes (Signed)
Pt HR ranging between 190-200s. Made Patty CHarge RN and Dr Sherry Ruffing aware. Awaiting new orders.

## 2020-04-13 NOTE — ED Provider Notes (Signed)
7:20 AM Care assumed from Dr. Dayna Barker.  At time of transfer care, patient is waiting for IR to come evaluate palliative thoracentesis this morning.  After this is completed, will reassess patient to determine with hospice team if he is appropriate for home hospice, inpatient hospice, or needs admission for comfort and palliative management.  Patient and family are in agreement with this plan.  He is DNR DNI and does not want any electrical intervention if he goes into arrhythmias at this time.   Ultrasound was going to get patient to do the thoracentesis but patient started to rapidly decline.  We made the decision with family not to pursue the procedure this morning and instead keep him comfortable.  Shortly thereafter, I was called to the room because the patient started having lower blood pressures and slowed heart rate.  We observe the patient for several minutes and has had his wife gave him good lightheaded, he went into asystole.  He did not have any further cardiac activity on the monitor and he was unresponsive.  No spontaneous breathing and no response to pain.    Time of death was at 9:58 AM.  We will let the hospice team and chaplain team know for assistance.  Family told nursing that patient wishes to have his body donated to the [facility].  Nursing is trying to contact him to arrange this.  I will call the PCP to let them know.  I anticipate the new electronic data certificate will be sent either to myself or PCP for completion.  10:43 AM Just spoke with patient's PCP, Dr. Janit Pagan. He is aware of the patient's passing. He reports he is in the process of signing up for the electronic certificate registry so either himself or myself or someone else will fill the certificate.    Clinical Impression: 1. Hypoxia   2. Pleural effusion   3. SOB (shortness of breath)     Disposition: Expired      [name], Gwenyth Allegra, MD 05-06-2020 1635

## 2020-04-13 NOTE — ED Notes (Signed)
Pt changed over from nasal canula to high flow humidified oxygen at 6L

## 2020-04-13 NOTE — ED Notes (Signed)
[facility] body donation program coordinator, [name] called back about accepting patient. But according to his height, weight and body fluid pt is not a candidate for donation to their program.  Made pt's wife, [name], at bedside aware. She will be taking time to make a decision on what funeral home for patient to go to .  [facility] of [facility] hospital phone number to call back once they have made a decision.

## 2020-04-13 NOTE — ED Notes (Signed)
Dr at bedside, decision made not to go for thoracentesis and to make pt comfortable. He now back in NSR <100bpm. Verbal order to stop Amiodarone drip per Dr Sherry Ruffing.

## 2020-04-13 NOTE — ED Notes (Signed)
Per wife, pt states that patient request to donate his body to [facility] when he expires. Phone number to reach them at are daytime (8a-5P) [phone], and After hours number [phone], ask operator to call on-call staff about accepting body.

## 2020-04-13 NOTE — ED Notes (Signed)
ED Provider at bedside. 

## 2020-04-13 NOTE — ED Notes (Signed)
Wife and friend left. Will be contacting [facility] with funeral home for arrangements to be made.

## 2020-04-13 NOTE — ED Notes (Signed)
Spouse at the bedside, patient has declined additional pain medication at this time.

## 2020-04-13 NOTE — ED Notes (Signed)
[facility] body donations department and left a message to call back about accepting pt's body per patient and wife wishes.

## 2020-04-13 NOTE — Progress Notes (Signed)
IR request for thoracentesis. Patient has experienced a marked decline in his blood pressure and mentation. IR notified by the ED that the patient will not be coming to IR for thoracentesis and he will remain in the ED for comfort.   IR order will be deleted. Please contact IR if we can be of further assistance.  Soyla Dryer, [place] [phone] 2020-04-16, 9:56 AM

## 2020-04-13 NOTE — ED Notes (Signed)
Dr Tegeler pronounced Time of death at [phone]

## 2020-04-13 DEATH — deceased

## 2020-06-09 ENCOUNTER — Encounter: Payer: Medicare Other | Admitting: Internal Medicine

## 2020-10-08 IMAGING — CT CT CHEST W/ CM
2 of 4 series · 14 of 36 positions shown, 17 images · IV contrast (OMNIPAQUE)
Comparison: 06/25/2019

CLINICAL DATA: Non-small-cell lung cancer. Restaging.

EXAM:
CT CHEST, ABDOMEN, AND PELVIS WITH CONTRAST
TECHNIQUE: Multidetector CT imaging of the chest, abdomen and pelvis was
performed following the standard protocol during bolus
administration of intravenous contrast.
CONTRAST:  100mL OMNIPAQUE IOHEXOL 300 MG/ML  SOLN

[Series 2: cap with · axial · 0.88mm/px · z∈[+965,+1490]mm · 11 of 127 slices shown, 14 images]
[im 11/127  mediastinal]
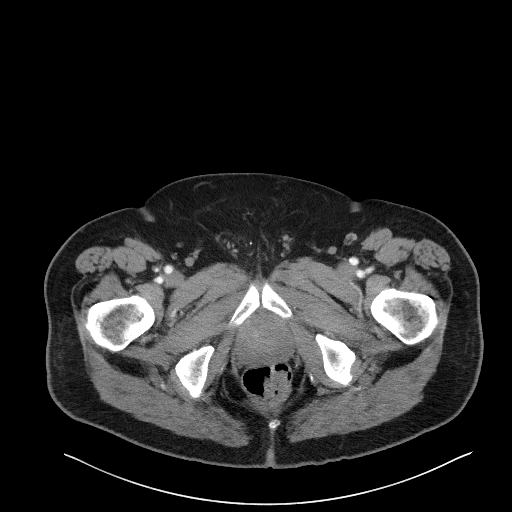
[im 11/127  lung]
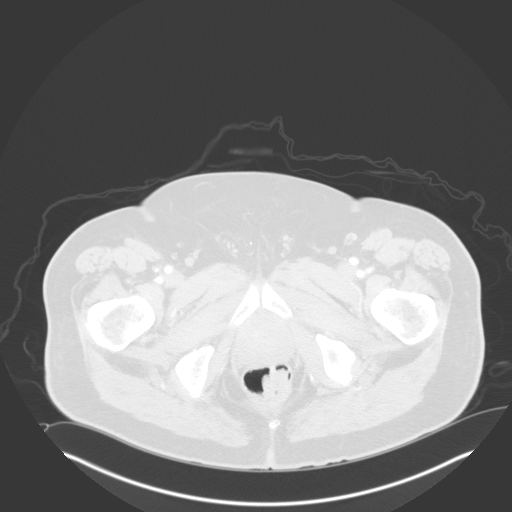
[im 22/127  lung]
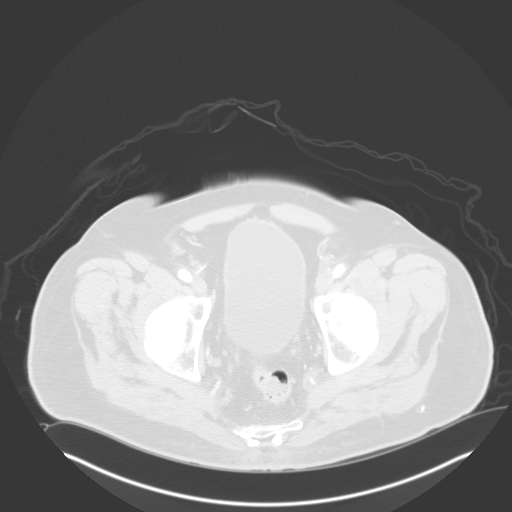
[im 32/127  lung]
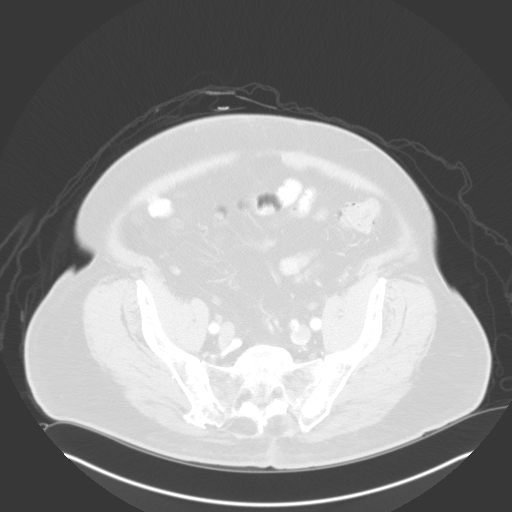
[im 43/127  lung]
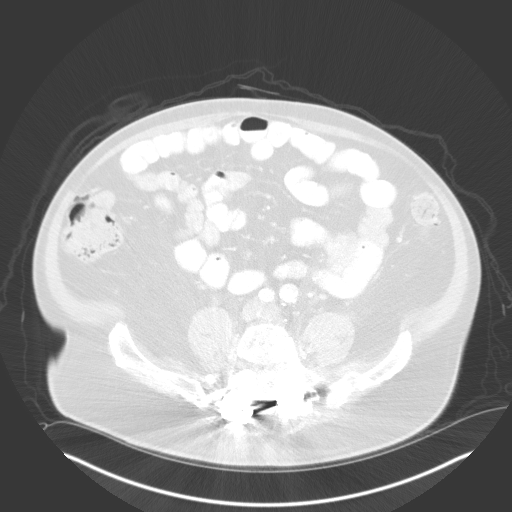
[im 53/127  mediastinal]
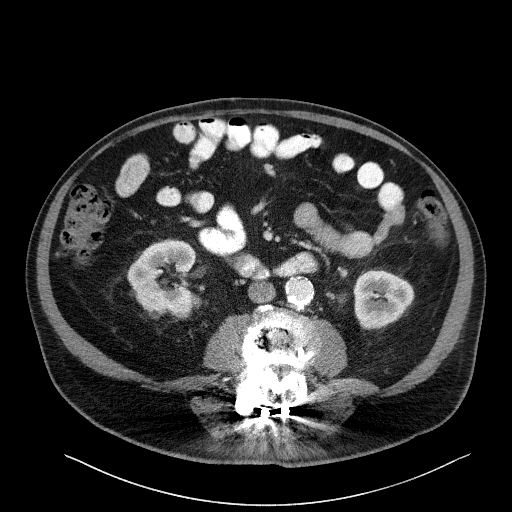
[im 53/127  lung]
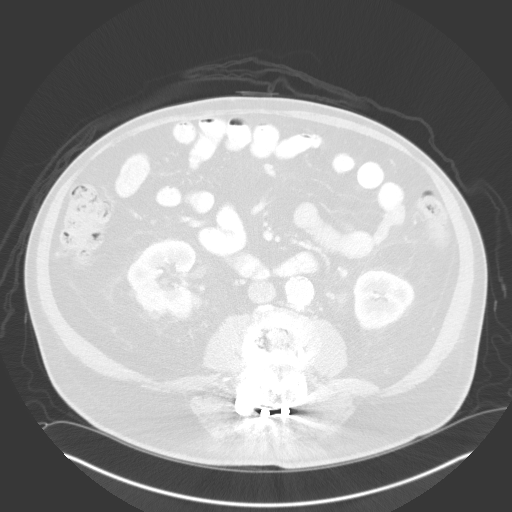
[im 64/127  lung]
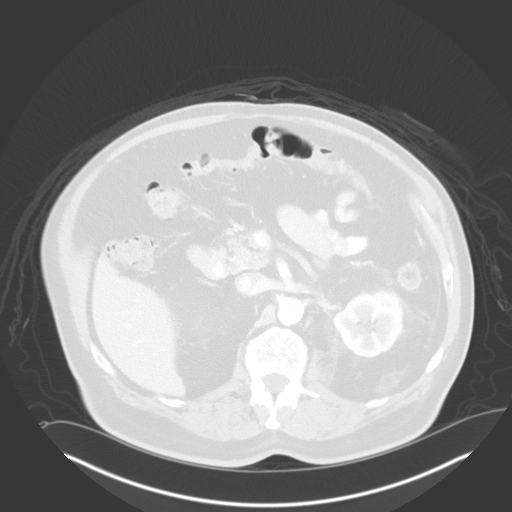
[im 74/127  lung]
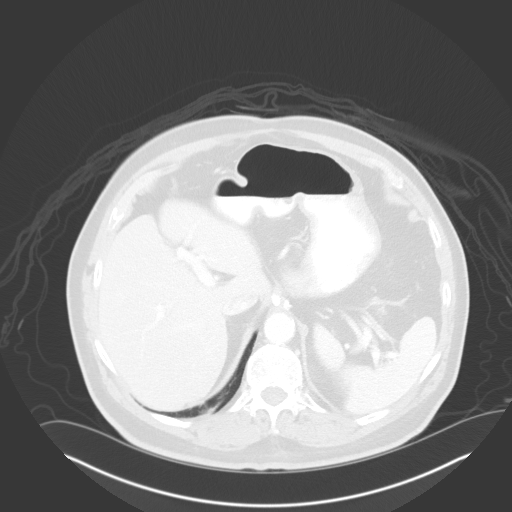
[im 85/127  lung]
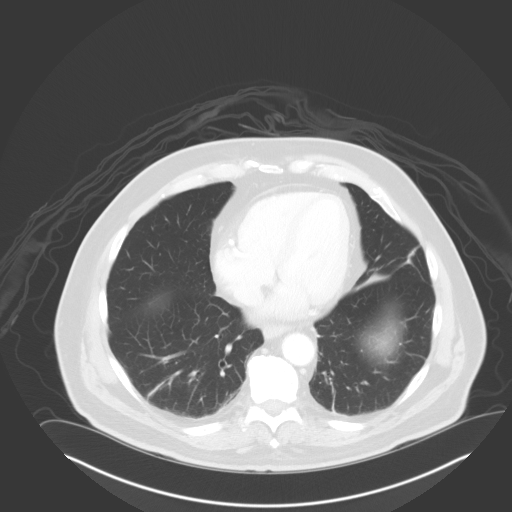
[im 95/127  mediastinal]
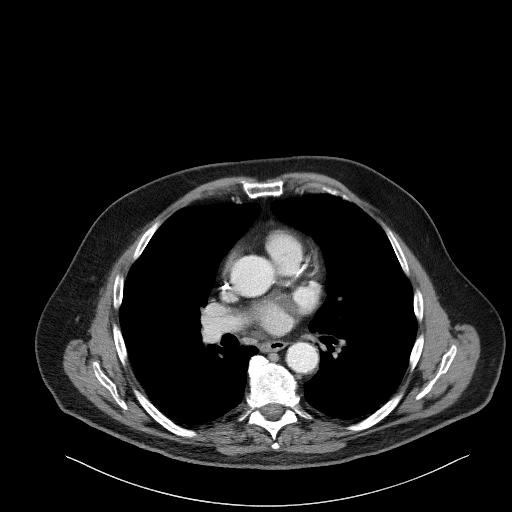
[im 95/127  lung]
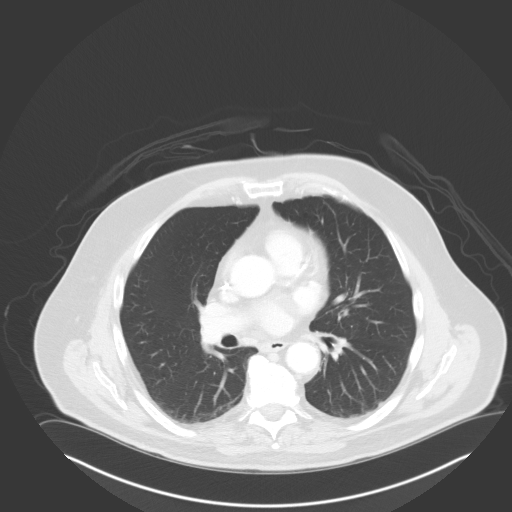
[im 106/127  lung]
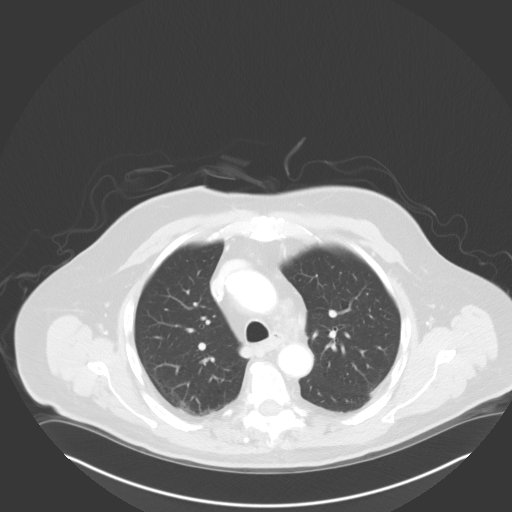
[im 116/127  lung]
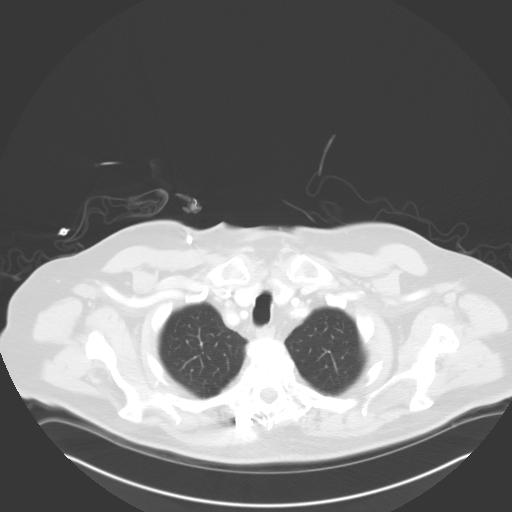

[Series 4: coronals · coronal · 1.03mm/px · 3 of 160 slices shown]
[im 32/160  lung]
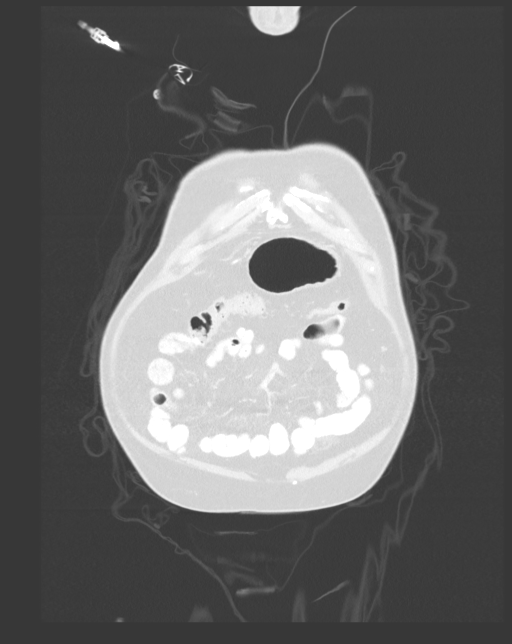
[im 64/160  lung]
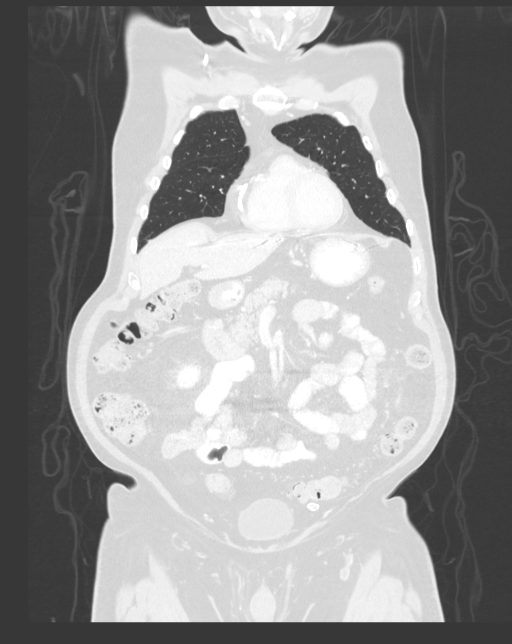
[im 96/160  lung]
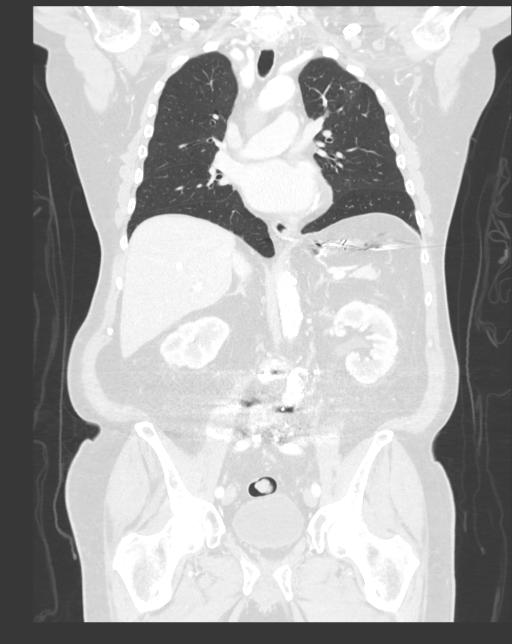

[14 of 36 positions shown; findings below may reference images not displayed]

FINDINGS: CT CHEST FINDINGS

Cardiovascular: The heart size is normal. No substantial pericardial
effusion. Coronary artery calcification is evident. Atherosclerotic
calcification is noted in the wall of the thoracic aorta. Right
Port-A-Cath tip is positioned at the SVC/RA junction.

Mediastinum/Nodes: No mediastinal lymphadenopathy. There is no hilar
lymphadenopathy. The esophagus has normal imaging features. There is
no axillary lymphadenopathy.

Lungs/Pleura: Spiculated left upper lobe pulmonary nodule has
progressed in the interval, measuring 1.5 x 1.3 cm today (image
36/series 6) compared to 1.2 x 1.0 cm when I remeasure in a similar
fashion on the prior study.

Centrilobular emphsyema noted. No new suspicious pulmonary nodule or
mass. No focal airspace consolidation. Chronic atelectasis or linear
scarring noted posterior lower lungs bilaterally.

Musculoskeletal: No worrisome lytic or sclerotic osseous
abnormality. Extensive cervicothoracic spinal fusion hardware
evident.

CT ABDOMEN PELVIS FINDINGS

Hepatobiliary: No suspicious focal abnormality within the liver
parenchyma. Gallbladder is surgically absent. No intrahepatic or
extrahepatic biliary dilation.

Pancreas: No focal mass lesion. No dilatation of the main duct. No
intraparenchymal cyst. No peripancreatic edema.

Spleen: No splenomegaly. No focal mass lesion.

Adrenals/Urinary Tract: No adrenal nodule or mass. Right kidney
unremarkable. 3 mm nonobstructing stone noted lower pole left kidney
with stable exophytic 2.1 cm low-density lesion noted at the lower
pole. No evidence for hydroureter. The urinary bladder appears
normal for the degree of distention.

Stomach/Bowel: Surgical changes noted around the esophagogastric
junction suggest prior fundoplication. Duodenum is normally
positioned as is the ligament of Treitz. No small bowel wall
thickening. No small bowel dilatation. The terminal ileum is normal.
The appendix is not visualized, but there is no edema or
inflammation in the region of the cecum. No gross colonic mass. No
colonic wall thickening. Diverticular changes are noted in the left
colon without evidence of diverticulitis.

Vascular/Lymphatic: There is abdominal aortic atherosclerosis
without aneurysm. There is no gastrohepatic or hepatoduodenal
ligament lymphadenopathy. No retroperitoneal or mesenteric
lymphadenopathy. No pelvic sidewall lymphadenopathy.

Reproductive: The prostate gland and seminal vesicles are
unremarkable.

Other: No intraperitoneal free fluid.

Musculoskeletal: Sclerotic lesions in the posterior iliac bones
bilaterally are stable as are the posterior iliac lytic lesions.
Left iliac lesion measured previously at 3.8 cm is stable, measuring
3.7 cm today. Lumbar fusion hardware again noted.
IMPRESSION: 1. Mild interval progression of the spiculated left upper lobe
pulmonary nodule.
2. Otherwise, no new or progressive findings in the chest, abdomen,
or pelvis.
3. Stable appearance of sclerotic and lucent bilateral iliac bone
lesions.
4. Nonobstructing left renal stone with stable exophytic low-density
lesion lower pole left kidney, likely a cyst.
5. Aortic Atherosclerosis ([id number]) and Emphysema ([id number]).

## 2021-03-05 ENCOUNTER — Ambulatory Visit: Payer: Medicare Other | Admitting: Adult Health
